# Patient Record
Sex: Male | Born: 1940 | Race: White | Hispanic: No | Smoking: Former smoker
Health system: Southern US, Community
[De-identification: ages and names within clinical notes are randomized; demographics above are authoritative.]

## PROBLEM LIST (undated history)

## (undated) DIAGNOSIS — M1990 Unspecified osteoarthritis, unspecified site: Secondary | ICD-10-CM

## (undated) DIAGNOSIS — E119 Type 2 diabetes mellitus without complications: Secondary | ICD-10-CM

## (undated) DIAGNOSIS — G4733 Obstructive sleep apnea (adult) (pediatric): Secondary | ICD-10-CM

## (undated) DIAGNOSIS — I2510 Atherosclerotic heart disease of native coronary artery without angina pectoris: Secondary | ICD-10-CM

## (undated) DIAGNOSIS — I10 Essential (primary) hypertension: Secondary | ICD-10-CM

## (undated) DIAGNOSIS — E785 Hyperlipidemia, unspecified: Secondary | ICD-10-CM

## (undated) DIAGNOSIS — Z7901 Long term (current) use of anticoagulants: Secondary | ICD-10-CM

## (undated) DIAGNOSIS — N183 Chronic kidney disease, stage 3 unspecified: Secondary | ICD-10-CM

## (undated) DIAGNOSIS — I509 Heart failure, unspecified: Secondary | ICD-10-CM

## (undated) DIAGNOSIS — I4891 Unspecified atrial fibrillation: Secondary | ICD-10-CM

## (undated) HISTORY — DX: Atherosclerotic heart disease of native coronary artery without angina pectoris: I25.10

## (undated) HISTORY — PX: BLADDER SURGERY: SHX569

## (undated) HISTORY — DX: Heart failure, unspecified: I50.9

## (undated) HISTORY — PX: REPLACEMENT TOTAL KNEE BILATERAL: SUR1225

## (undated) HISTORY — PX: CARDIOVERSION: SHX1299

## (undated) HISTORY — PX: STENT PLACEMENT VASCULAR (ARMC HX): HXRAD1737

## (undated) HISTORY — PX: BYPASS GRAFT: SHX909

## (undated) HISTORY — PX: COLON SURGERY: SHX602

---

## 1968-05-23 HISTORY — PX: BACK SURGERY: SHX140

## 1989-05-23 DIAGNOSIS — I2510 Atherosclerotic heart disease of native coronary artery without angina pectoris: Secondary | ICD-10-CM

## 1989-05-23 DIAGNOSIS — Z951 Presence of aortocoronary bypass graft: Secondary | ICD-10-CM

## 1989-05-23 DIAGNOSIS — I219 Acute myocardial infarction, unspecified: Secondary | ICD-10-CM

## 1989-05-23 HISTORY — DX: Presence of aortocoronary bypass graft: Z95.1

## 1989-05-23 HISTORY — DX: Atherosclerotic heart disease of native coronary artery without angina pectoris: I25.10

## 1989-05-23 HISTORY — DX: Acute myocardial infarction, unspecified: I21.9

## 1990-01-02 HISTORY — PX: CORONARY ARTERY BYPASS GRAFT: SHX141

## 1998-05-23 HISTORY — PX: BLADDER SURGERY: SHX569

## 2006-05-23 HISTORY — PX: STENT PLACEMENT VASCULAR (ARMC HX): HXRAD1737

## 2006-05-23 HISTORY — PX: REPLACEMENT TOTAL KNEE BILATERAL: SUR1225

## 2015-05-03 DIAGNOSIS — G4733 Obstructive sleep apnea (adult) (pediatric): Secondary | ICD-10-CM | POA: Insufficient documentation

## 2015-05-03 DIAGNOSIS — S04019A Injury of optic nerve, unspecified eye, initial encounter: Secondary | ICD-10-CM | POA: Insufficient documentation

## 2015-05-19 DIAGNOSIS — R0609 Other forms of dyspnea: Secondary | ICD-10-CM | POA: Insufficient documentation

## 2015-05-19 DIAGNOSIS — E785 Hyperlipidemia, unspecified: Secondary | ICD-10-CM | POA: Insufficient documentation

## 2015-06-17 DIAGNOSIS — R600 Localized edema: Secondary | ICD-10-CM | POA: Insufficient documentation

## 2015-07-10 HISTORY — PX: CARDIOVERSION: SHX1299

## 2015-08-05 DIAGNOSIS — R600 Localized edema: Secondary | ICD-10-CM | POA: Insufficient documentation

## 2015-08-13 DIAGNOSIS — J301 Allergic rhinitis due to pollen: Secondary | ICD-10-CM | POA: Insufficient documentation

## 2015-08-13 DIAGNOSIS — H6123 Impacted cerumen, bilateral: Secondary | ICD-10-CM | POA: Insufficient documentation

## 2016-04-22 DIAGNOSIS — R609 Edema, unspecified: Secondary | ICD-10-CM | POA: Insufficient documentation

## 2016-09-08 DIAGNOSIS — K439 Ventral hernia without obstruction or gangrene: Secondary | ICD-10-CM | POA: Insufficient documentation

## 2017-11-17 DIAGNOSIS — I872 Venous insufficiency (chronic) (peripheral): Secondary | ICD-10-CM | POA: Insufficient documentation

## 2018-11-02 DIAGNOSIS — E538 Deficiency of other specified B group vitamins: Secondary | ICD-10-CM | POA: Insufficient documentation

## 2018-11-02 DIAGNOSIS — K5903 Drug induced constipation: Secondary | ICD-10-CM | POA: Insufficient documentation

## 2019-06-26 ENCOUNTER — Encounter: Payer: Self-pay | Admitting: Internal Medicine

## 2019-06-26 ENCOUNTER — Emergency Department: Payer: Medicare Other

## 2019-06-26 ENCOUNTER — Other Ambulatory Visit: Payer: Self-pay

## 2019-06-26 ENCOUNTER — Observation Stay
Admission: EM | Admit: 2019-06-26 | Discharge: 2019-06-27 | Disposition: A | Discharge status: Home / Self Care | Payer: Medicare Other | Attending: Internal Medicine | Admitting: Internal Medicine

## 2019-06-26 DIAGNOSIS — Z79899 Other long term (current) drug therapy: Secondary | ICD-10-CM | POA: Insufficient documentation

## 2019-06-26 DIAGNOSIS — Z794 Long term (current) use of insulin: Secondary | ICD-10-CM

## 2019-06-26 DIAGNOSIS — Z7982 Long term (current) use of aspirin: Secondary | ICD-10-CM | POA: Insufficient documentation

## 2019-06-26 DIAGNOSIS — I10 Essential (primary) hypertension: Secondary | ICD-10-CM

## 2019-06-26 DIAGNOSIS — Z7984 Long term (current) use of oral hypoglycemic drugs: Secondary | ICD-10-CM | POA: Insufficient documentation

## 2019-06-26 DIAGNOSIS — Z20822 Contact with and (suspected) exposure to covid-19: Secondary | ICD-10-CM | POA: Diagnosis not present

## 2019-06-26 DIAGNOSIS — E785 Hyperlipidemia, unspecified: Secondary | ICD-10-CM | POA: Diagnosis not present

## 2019-06-26 DIAGNOSIS — E119 Type 2 diabetes mellitus without complications: Secondary | ICD-10-CM

## 2019-06-26 DIAGNOSIS — R778 Other specified abnormalities of plasma proteins: Secondary | ICD-10-CM

## 2019-06-26 DIAGNOSIS — E1140 Type 2 diabetes mellitus with diabetic neuropathy, unspecified: Secondary | ICD-10-CM

## 2019-06-26 DIAGNOSIS — I4891 Unspecified atrial fibrillation: Secondary | ICD-10-CM

## 2019-06-26 DIAGNOSIS — I4892 Unspecified atrial flutter: Secondary | ICD-10-CM | POA: Insufficient documentation

## 2019-06-26 DIAGNOSIS — Z87891 Personal history of nicotine dependence: Secondary | ICD-10-CM | POA: Insufficient documentation

## 2019-06-26 DIAGNOSIS — R079 Chest pain, unspecified: Secondary | ICD-10-CM | POA: Insufficient documentation

## 2019-06-26 DIAGNOSIS — Z951 Presence of aortocoronary bypass graft: Secondary | ICD-10-CM

## 2019-06-26 DIAGNOSIS — R002 Palpitations: Secondary | ICD-10-CM | POA: Diagnosis present

## 2019-06-26 DIAGNOSIS — R61 Generalized hyperhidrosis: Secondary | ICD-10-CM

## 2019-06-26 DIAGNOSIS — I480 Paroxysmal atrial fibrillation: Secondary | ICD-10-CM | POA: Diagnosis not present

## 2019-06-26 DIAGNOSIS — I2510 Atherosclerotic heart disease of native coronary artery without angina pectoris: Secondary | ICD-10-CM

## 2019-06-26 DIAGNOSIS — I472 Ventricular tachycardia: Principal | ICD-10-CM | POA: Insufficient documentation

## 2019-06-26 DIAGNOSIS — R7989 Other specified abnormal findings of blood chemistry: Secondary | ICD-10-CM | POA: Diagnosis not present

## 2019-06-26 DIAGNOSIS — I208 Other forms of angina pectoris: Secondary | ICD-10-CM

## 2019-06-26 DIAGNOSIS — I2089 Other forms of angina pectoris: Secondary | ICD-10-CM

## 2019-06-26 HISTORY — DX: Unspecified atrial fibrillation: I48.91

## 2019-06-26 HISTORY — DX: Chest pain, unspecified: R07.9

## 2019-06-26 HISTORY — DX: Essential (primary) hypertension: I10

## 2019-06-26 HISTORY — DX: Hyperlipidemia, unspecified: E78.5

## 2019-06-26 HISTORY — DX: Type 2 diabetes mellitus without complications: E11.9

## 2019-06-26 LAB — SARS CORONAVIRUS 2 (TAT 6-24 HRS): SARS Coronavirus 2: NEGATIVE

## 2019-06-26 LAB — TROPONIN I (HIGH SENSITIVITY)
Troponin I (High Sensitivity): 43 ng/L — ABNORMAL HIGH (ref <18)
Troponin I (High Sensitivity): 57 ng/L — ABNORMAL HIGH (ref <18)
Troponin I (High Sensitivity): 54 ng/L — ABNORMAL HIGH (ref <18)

## 2019-06-26 LAB — BASIC METABOLIC PANEL
Anion gap: 8 (ref 5–15)
BUN: 25 mg/dL — ABNORMAL HIGH (ref 8–23)
CO2: 27 mmol/L (ref 22–32)
Calcium: 9.3 mg/dL (ref 8.9–10.3)
Chloride: 106 mmol/L (ref 98–111)
Creatinine, Ser: 1.12 mg/dL (ref 0.61–1.24)
GFR calc Af Amer: >60 mL/min (ref >60)
GFR calc non Af Amer: >60 mL/min (ref >60)
Glucose, Bld: 150 mg/dL — ABNORMAL HIGH (ref 70–99)
Potassium: 4.6 mmol/L (ref 3.5–5.1)
Sodium: 141 mmol/L (ref 135–145)

## 2019-06-26 LAB — CBC
HCT: 42.6 % (ref 39.0–52.0)
Hemoglobin: 13.5 g/dL (ref 13.0–17.0)
MCH: 28.3 pg (ref 26.0–34.0)
MCHC: 31.7 g/dL (ref 30.0–36.0)
MCV: 89.3 fL (ref 80.0–100.0)
Platelets: 173 10*3/uL (ref 150–400)
RBC: 4.77 MIL/uL (ref 4.22–5.81)
RDW: 13.6 % (ref 11.5–15.5)
WBC: 6.1 10*3/uL (ref 4.0–10.5)
nRBC: 0 % (ref 0.0–0.2)

## 2019-06-26 LAB — GLUCOSE, CAPILLARY
Glucose-Capillary: 79 mg/dL (ref 70–99)
Glucose-Capillary: 153 mg/dL — ABNORMAL HIGH (ref 70–99)

## 2019-06-26 LAB — HEMOGLOBIN A1C
Hgb A1c MFr Bld: 6.2 % — ABNORMAL HIGH (ref 4.8–5.6)
Mean Plasma Glucose: 131.24 mg/dL

## 2019-06-26 MED ORDER — LIDOCAINE VISCOUS HCL 2 % MT SOLN
15.0000 mL | Freq: Once | OROMUCOSAL | Status: DC
  Filled 2019-06-26: qty 15

## 2019-06-26 MED ORDER — ALUM & MAG HYDROXIDE-SIMETH 200-200-20 MG/5ML PO SUSP: 30.0000 mL | Freq: Once | ORAL | Status: DC

## 2019-06-26 MED ORDER — RAMIPRIL 10 MG PO CAPS
20.0000 mg | ORAL_CAPSULE | Freq: Every day | ORAL | Status: DC
  Administered 2019-06-26 – 2019-06-27 (×2): 20 mg via ORAL
  Filled 2019-06-26 (×2): qty 2

## 2019-06-26 MED ORDER — DILTIAZEM HCL ER COATED BEADS 120 MG PO CP24
120.0000 mg | ORAL_CAPSULE | Freq: Every day | ORAL | Status: DC
  Administered 2019-06-27: 08:00:00 120 mg via ORAL
  Filled 2019-06-26: qty 1

## 2019-06-26 MED ORDER — ALBUTEROL SULFATE (2.5 MG/3ML) 0.083% IN NEBU: 2.5000 mg | INHALATION_SOLUTION | Freq: Four times a day (QID) | RESPIRATORY_TRACT | Status: DC | PRN

## 2019-06-26 MED ORDER — ACETAMINOPHEN 500 MG PO TABS
1000.0000 mg | ORAL_TABLET | Freq: Four times a day (QID) | ORAL | Status: DC | PRN
  Administered 2019-06-26 – 2019-06-27 (×2): 1000 mg via ORAL
  Filled 2019-06-26 (×2): qty 2

## 2019-06-26 MED ORDER — ATENOLOL 25 MG PO TABS
25.0000 mg | ORAL_TABLET | Freq: Every day | ORAL | Status: DC
  Administered 2019-06-26: 13:00:00 25 mg via ORAL
  Filled 2019-06-26: qty 1

## 2019-06-26 MED ORDER — SPIRONOLACTONE 25 MG PO TABS
25.0000 mg | ORAL_TABLET | Freq: Every day | ORAL | Status: DC
  Administered 2019-06-26 – 2019-06-27 (×2): 25 mg via ORAL
  Filled 2019-06-26 (×2): qty 1

## 2019-06-26 MED ORDER — ENOXAPARIN SODIUM 40 MG/0.4ML ~~LOC~~ SOLN: 40.0000 mg | SUBCUTANEOUS | Status: DC

## 2019-06-26 MED ORDER — TRAMADOL HCL 50 MG PO TABS: 50.0000 mg | ORAL_TABLET | Freq: Four times a day (QID) | ORAL | Status: DC | PRN

## 2019-06-26 MED ORDER — ASPIRIN EC 325 MG PO TBEC
325.0000 mg | DELAYED_RELEASE_TABLET | Freq: Every day | ORAL | Status: DC
  Administered 2019-06-26 – 2019-06-27 (×2): 325 mg via ORAL
  Filled 2019-06-26 (×2): qty 1

## 2019-06-26 MED ORDER — ATORVASTATIN CALCIUM 20 MG PO TABS
40.0000 mg | ORAL_TABLET | Freq: Every day | ORAL | Status: DC
  Administered 2019-06-26 – 2019-06-27 (×2): 40 mg via ORAL
  Filled 2019-06-26 (×2): qty 2

## 2019-06-26 MED ORDER — INSULIN ASPART 100 UNIT/ML ~~LOC~~ SOLN
0.0000 [IU] | Freq: Three times a day (TID) | SUBCUTANEOUS | Status: DC
  Filled 2019-06-26: qty 1

## 2019-06-26 MED ORDER — FLUTICASONE PROPIONATE 50 MCG/ACT NA SUSP
2.0000 | Freq: Every day | NASAL | Status: DC | PRN
  Filled 2019-06-26: qty 16

## 2019-06-26 MED ORDER — ONDANSETRON HCL 4 MG/2ML IJ SOLN: 4.0000 mg | Freq: Four times a day (QID) | INTRAMUSCULAR | Status: DC | PRN

## 2019-06-26 NOTE — ED Notes (Signed)
Pt given meal tray and water 

## 2019-06-26 NOTE — ED Notes (Signed)
Patient reports that he woke up this AM at approx 530 and checked his heart rate at home and it was 115. Patient reports he felt diaphoretic at that time. Patient states he has history of a fib and has been cardioverted in the past. Patient states he takes a 325 aspirin daily. Patient denies any chest pain and reports intermittent left arm pain that is ongoing and not new

## 2019-06-26 NOTE — ED Provider Notes (Signed)
[facility] Emergency Department Provider Note   ____________________________________________    I have reviewed the triage vital signs and the nursing notes.   HISTORY  Chief Complaint Palpitations     HPI Randall Vincent is a 79 y.o. male who presents with complaints of elevated heart rate.  Patient reports that he was checking his heart rate blood pressure and glucose like he does every morning and noted that his heart rate was elevated.  He reports usually his heart rate is in the 70s.  He did recently stop a beta-blocker about a month ago.  Follows with [facility] cardiology.  He is on 324 mg aspirin, no other blood thinners.  Review of records demonstrates a history of paroxysmal atrial fibrillation.  He reports that he was once cardioverted but has not had any issues for quite some time  No past medical history on file.  There are no problems to display for this patient.     Prior to Admission medications   Not on File     Allergies Patient has no allergy information on record.  No family history on file.  Social History No alcohol or drug use  Review of Systems  Constitutional: No fever/chills, did have an episode of "clamminess " Eyes: No visual changes.  ENT: No sore throat. Cardiovascular: Denies chest pain. Respiratory: Denies shortness of breath. Gastrointestinal: No abdominal pain.  No nausea, no vomiting.   Genitourinary: Negative for dysuria. Musculoskeletal: Negative for back pain. Skin: Negative for rash. Neurological: Negative for headaches   ____________________________________________   PHYSICAL EXAM:  VITAL SIGNS: ED Triage Vitals  Enc Vitals Group     BP 06/26/19 0640 (!) 201/101     Pulse Rate 06/26/19 0640 (!) 110     Resp 06/26/19 0640 20     Temp 06/26/19 0642 (!) 97.4 F (36.3 C)     Temp Source 06/26/19 0642 Oral     SpO2 06/26/19 0640 96 %     Weight 06/26/19 0641 117.9 kg (260 lb)   Height 06/26/19 0641 1.727 m (5\' 8" )     Head Circumference --      Peak Flow --      Pain Score 06/26/19 0641 0     Pain Loc --      Pain Edu? --      Excl. in GC? --     Constitutional: Alert and oriented.   Nose: No congestion/rhinnorhea. Mouth/Throat: Mucous membranes are moist.    Cardiovascular: Tachycardia, regular rhythm grossly normal heart sounds.  Good peripheral circulation. Respiratory: Normal respiratory effort.  No retractions. Lungs CTAB. Gastrointestinal: Soft and nontender. No distention.  No CVA tenderness.  Musculoskeletal: Mild edema bilaterally warm and well perfused Neurologic:  Normal speech and language. No gross focal neurologic deficits are appreciated.  Skin:  Skin is warm, dry and intact. No rash noted. Psychiatric: Mood and affect are normal. Speech and behavior are normal.  ____________________________________________   LABS (all labs ordered are listed, but only abnormal results are displayed)  Labs Reviewed  BASIC METABOLIC PANEL - Abnormal; Notable for the following components:      Result Value   Glucose, Bld 150 (*)    BUN 25 (*)    All other components within normal limits  TROPONIN I (HIGH SENSITIVITY) - Abnormal; Notable for the following components:   Troponin I (High Sensitivity) 43 (*)    All other components within normal limits  CBC   ____________________________________________  EKG  ED ECG REPORT I, Lavonia Drafts, the attending physician, personally viewed and interpreted this ECG.  Date: 06/26/2019  Rhythm: Atrial flutter QRS Axis: normal Intervals: Abnormal ST/T Wave abnormalities: normal Narrative Interpretation: no evidence of acute ischemia  ____________________________________________  RADIOLOGY  Chest x-ray ____________________________________________   PROCEDURES  Procedure(s) performed: No  Procedures   Critical Care performed: No ____________________________________________   INITIAL  IMPRESSION / ASSESSMENT AND PLAN / ED COURSE  Pertinent labs & imaging results that were available during my care of the patient were reviewed by me and considered in my medical decision making (see chart for details).  Patient presents with tachycardia, episode of clamminess.  Does have a history of coronary artery disease, follows with [facility] cardiology.  He is hypertensive here with mildly elevated heart rate, EKG most consistent with atrial flutter.  He is not on a beta-blocker at this time.  No chest pain or new shortness of breath.  Pending labs   Lab work significant for elevated troponin of 43.  Given hypertension, clamminess, paroxysmal a flutter in the setting of significant CAD will discuss with the hospitalist for admission    ____________________________________________   FINAL CLINICAL IMPRESSION(S) / ED DIAGNOSES  Final diagnoses:  Atrial flutter, paroxysmal (HCC)  Elevated troponin        Note:  This document was prepared using Dragon voice recognition software and may include unintentional dictation errors.   Lavonia Drafts, MD 06/26/19 [phone]

## 2019-06-26 NOTE — ED Triage Notes (Signed)
Pt in with co palpitations and htn this am, states did become diaphoretic. States same symptoms as when he had MI. States hx of cardioversion in the past due to afib.

## 2019-06-26 NOTE — H&P (Addendum)
History and Physical:    Randall Vincent   VHQ:[id number] DOB: 09-27-1940 DOA: 06/26/2019  Referring MD/provider: Dr. Cyril Loosen PCP: Elspeth Cho., MD   Patient coming from: Home  Chief Complaint: Tachycardia  History of Present Illness:   Randall Vincent is an 79 y.o. male with past medical history significant for CAD status post CABG and status post stents times 06/2007, history of paroxysmal A. fib status post cardioversion who noted that his heart rate was 110 and blood pressure was grossly elevated this morning at his usual morning blood pressure check.  Patient states that he was tapered off of his atenolol by his PCP per his request because of erectile dysfunction.  He was placed on amlodipine instead of atenolol and has been on amlodipine for 1 month now.  This morning he noted his heart rate to be 110 when it is usually 70.  He also felt somewhat cool and clammy which was similar to his previous anginal equivalent.  ED Course:  The patient was noted to be comfortable with no chest pain or shortness of breath.  EKG was was noted to be rate controlled A. fib/flutter.  Patient is admitted for rule out given episode of clamminess this morning which is similar to his anginal equivalent.  ROS:   ROS   Review of Systems: General: Denies fever, chills, malaise,  Respiratory: Denies cough, SOB at rest or hemoptysis GI: Denies nausea, vomiting, diarrhea or constipation GU: Denies dysuria, frequency or hematuria CNS: Denies HA, dizziness, confusion, new weakness or clumsiness. Musculoskeletal: Denies new back or joint pain Blood/lymphatics: Denies easy bruising or bleeding Mood/affect: Denies anxiety/depression    Past Medical History:   Past Medical History:  Diagnosis Date  . A-fib (HCC)   . Diabetes (HCC)   . Hyperlipidemia   . Hypertension     Past Surgical History:     Social History:   Social History   Socioeconomic History  . Marital status: Widowed    Spouse  name: Not on file  . Number of children: Not on file  . Years of education: Not on file  . Highest education level: Not on file  Occupational History  . Not on file  Tobacco Use  . Smoking status: Former Smoker    Quit date: 1991    Years since quitting: 30.1  . Smokeless tobacco: Never Used  Substance and Sexual Activity  . Alcohol use: Not on file  . Drug use: Not on file  . Sexual activity: Not on file  Other Topics Concern  . Not on file  Social History Narrative  . Not on file   Social Determinants of Health   Financial Resource Strain:   . Difficulty of Paying Living Expenses: Not on file  Food Insecurity:   . Worried About Programme researcher, broadcasting/film/video in the Last Year: Not on file  . Ran Out of Food in the Last Year: Not on file  Transportation Needs:   . Lack of Transportation (Medical): Not on file  . Lack of Transportation (Non-Medical): Not on file  Physical Activity:   . Days of Exercise per Week: Not on file  . Minutes of Exercise per Session: Not on file  Stress:   . Feeling of Stress : Not on file  Social Connections:   . Frequency of Communication with Friends and Family: Not on file  . Frequency of Social Gatherings with Friends and Family: Not on file  . Attends Religious Services: Not on file  .  Active Member of Clubs or Organizations: Not on file  . Attends Banker Meetings: Not on file  . Marital Status: Not on file  Intimate Partner Violence:   . Fear of Current or Ex-Partner: Not on file  . Emotionally Abused: Not on file  . Physically Abused: Not on file  . Sexually Abused: Not on file    Allergies   Patient has no known allergies.  Family history:   No family history on file.  Current Medications:   Prior to Admission medications   Medication Sig Start Date End Date Taking? Authorizing Provider  acetaminophen (TYLENOL) 500 MG tablet Take 500-1,000 mg by mouth every 6 (six) hours as needed for mild pain or fever.   Yes [provider]  albuterol (VENTOLIN HFA) 108 (90 Base) MCG/ACT inhaler Inhale 2 puffs into the lungs every 6 (six) hours as needed for wheezing.   Yes [provider]  amLODipine (NORVASC) 5 MG tablet Take 5 mg by mouth daily. 05/20/19  Yes [provider]  aspirin 325 MG tablet Take 325 mg by mouth daily.   Yes [provider]  atorvastatin (LIPITOR) 40 MG tablet Take 40 mg by mouth daily. 06/18/19  Yes [provider]  fluticasone (FLONASE) 50 MCG/ACT nasal spray Place 2 sprays into both nostrils daily as needed for allergies or rhinitis.   Yes [provider]  metFORMIN (GLUCOPHAGE-XR) 500 MG 24 hr tablet Take 1,000 mg by mouth daily. 05/20/19  Yes [provider]  ramipril (ALTACE) 10 MG capsule Take 20 mg by mouth daily. 06/18/19  Yes [provider]  Semaglutide, 1 MG/DOSE, 2 MG/1.5ML SOPN Inject 1 mg into the skin every Thursday.   Yes [provider]  sildenafil (VIAGRA) 100 MG tablet Take 100 mg by mouth daily as needed for erectile dysfunction. 06/24/19  Yes [provider]  spironolactone (ALDACTONE) 25 MG tablet Take 25 mg by mouth daily. 06/18/19  Yes [provider]  traMADol (ULTRAM) 50 MG tablet Take 50 mg by mouth every 6 (six) hours as needed for moderate pain.   Yes [provider]  vitamin B-12 (CYANOCOBALAMIN) 1000 MCG tablet Take 1,000 mcg by mouth daily.   Yes [provider]    Physical Exam:   Vitals:   06/26/19 1330 06/26/19 1345 06/26/19 1400 06/26/19 1503  BP: (!) 152/73  (!) 152/77 (!) 175/85  Pulse: 72 68 70 71  Resp: (!) 21 (!) 22 19 18   Temp:    98.1 F (36.7 C)  TempSrc:      SpO2: 96% 95% 97% 100%  Weight:      Height:         Physical Exam: Blood pressure (!) 175/85, pulse 71, temperature 98.1 F (36.7 C), resp. rate 18, height 5\' 8"  (1.727 m), weight 117.9 kg, SpO2 100 %. Gen: Relatively well-appearing man sitting up in stretcher in no acute  distress Constitutional: Alert and awake, oriented x3, not in any acute distress. Eyes: sclera anicteric, conjuctiva clear, no lid lag, no exophthalmos, EOMI CVS: S1-S2 clear, 2/6 systolic murmur, no LE edema, normal pedal pulses  Respiratory:  normal effort, symmetrical excursion, CTA without adventitious sounds.  GI: NABS, soft, NT, ND, no palpable masses.  LE: No edema. No cyanosis Neuro: A/O x 3, Moving all extremities equally with normal strength, CN 3-12 intact, grossly nonfocal.  Psych: patient is logical and coherent, judgement and insight appear normal, mood and affect appropriate to situation. Skin: no rashes  or lesions or ulcers,    Data Review:    Labs: Basic Metabolic Panel: Recent Labs  Lab 06/26/19 0654  NA 141  K 4.6  CL 106  CO2 27  GLUCOSE 150*  BUN 25*  CREATININE 1.12  CALCIUM 9.3   Liver Function Tests: No results for input(s): AST, ALT, ALKPHOS, BILITOT, PROT, ALBUMIN in the last 168 hours. No results for input(s): LIPASE, AMYLASE in the last 168 hours. No results for input(s): AMMONIA in the last 168 hours. CBC: Recent Labs  Lab 06/26/19 0654  WBC 6.1  HGB 13.5  HCT 42.6  MCV 89.3  PLT 173   Cardiac Enzymes: No results for input(s): CKTOTAL, CKMB, CKMBINDEX, TROPONINI in the last 168 hours.  BNP (last 3 results) No results for input(s): PROBNP in the last 8760 hours. CBG: Recent Labs  Lab 06/26/19 1622  GLUCAP 79    Urinalysis No results found for: COLORURINE, APPEARANCEUR, LABSPEC, PHURINE, GLUCOSEU, HGBUR, BILIRUBINUR, KETONESUR, PROTEINUR, UROBILINOGEN, NITRITE, LEUKOCYTESUR    Radiographic Studies: DG Chest 2 View  Result Date: 06/26/2019 CLINICAL DATA:  Atrial fibrillation EXAM: CHEST - 2 VIEW COMPARISON:  04/27/2015 FINDINGS: Cardiomegaly. CABG. Mild interstitial prominence but no Kerley lines, effusion, or consolidation. Chronic extrapleural thickening the left base, smooth from fat deposition by 2018 abdominal CT.  Spondylosis with bridging osteophytes. IMPRESSION: Cardiomegaly without failure. Electronically Signed   By: Monte Fantasia M.D.   On: 06/26/2019 07:34    EKG: Independently reviewed.  I am not entirely sure what this rhythm is.  It is narrow complex and regular at 110.  There are at least 2-3 different morphologies of QRS.  There may be P waves that run through.  Q waves in 2 3 and F.  No acute ST-T wave changes.  Assessment/Plan:   Principal Problem:   Anginal equivalent (HCC) Active Problems:   CAD, multiple vessel   S/P CABG x 2   Paroxysmal A-fib (HCC)   Essential hypertension   DM (diabetes mellitus), type 68 ([place])  79 year old man with history of CAD status post CABG status post stenting times 06/2007, history of paroxysmal A. fib status post ablation now comes in with episode of perceived tachycardia 1 month after his atenolol was discontinued per his request because of erectile dysfunction.  Patient had an episode of feeling clammy during this time similar to his previous anginal equivalent.  EKG with unclear rhythm.   ANGINAL EQUIVALENT Patient had a brief episode of clamminess when his heart rate was 112 which apparently is similar to his anginal equivalent.  EKG without any ischemic changes.  Initial troponin is 43.  Patient received aspirin in the ED.  Without similar symptoms of clamminess at present.  ABNORMAL HEART RHYTHM It is unclear to me exactly what rhythm patient is in and whether he is reverted back to A. fib flutter. I have a text into Dr. Clayborn Bigness who is on-call however it states that he has been in procedure all day. Would repeat text him in the morning if he has not been able to see the patient later on today. If he is in A. fib patient will need to be back on anticoagulation. At present he is rate controlled.  HTN Will restart atenolol 25 mg, previous dose was atenolol HCTZ 50/25 Hold amlodipine I would titrate atenolol back to 50 as tolerated In any case  patient should be on a beta-blocker given his significant CAD  DM2 Carb controlled diet SSI AC at bedtime Hold semaglutide and  Metformin   Other information:   DVT prophylaxis: Lovenox ordered. Code Status: Full Family Communication: Patient states his family knows that he is here Disposition Plan: Home Consults called: Cardiology Admission status: Observation  Lelar Farewell Tublu Ovila [place] [facility]  If 7PM-7AM, please contact night-coverage [email or web address] Password [facility] 06/26/2019, 6:08 PM

## 2019-06-26 NOTE — ED Notes (Signed)
Pt unhooked to go to restroom. Able to walk independently.

## 2019-06-26 NOTE — Consult Note (Signed)
CARDIOLOGY CONSULT NOTE               Patient ID: Randall Vincent MRN: [id number] DOB/AGE: [birth year] 78 y.o.  Admit date: 06/26/2019 Referring Physician Dr Luberta Robertson hospitalist Primary Physician Patrina Levering MD Primary Cardiologist Dr. Theron Arista [facility] Reason for Consultation tachycardia  HPI: Patient is a 79 year old male multiple medical problems coronary disease status post coronary bypass surgery in the distant past but positive with PCI and stent last one in 2009 he had paroxysmal A. fib status post cardioversion since 2017 was on atenolol but that was discontinued because of erectile dysfunction issues he was switched to amlodipine and has done reasonably well with his blood pressure patient denied any recent palpitations or tachycardia but noticed them this morning his rates normally 60-70 but today was 110 so he came to emergency room he was found to have a wide-complex rhythm initially they thought it was atrial flutter I am not sure that at some point he did have nonsustained ventricular tachycardia there is no twelve-lead available for my inspection.  By time I saw the patient on the floor he was back in sinus rhythm at 70 he had received atenolol at some point during his hospitalization.  He has history of diabetes hyperlipidemia hypertension but feels reasonably well now.  Denies any chest pain anginal symptoms  Review of systems complete and found to be negative unless listed above     Past Medical History:  Diagnosis Date  . A-fib (HCC)   . Diabetes (HCC)   . Hyperlipidemia   . Hypertension     Past Surgical History:  Procedure Laterality Date  . BLADDER SURGERY    . BYPASS GRAFT    . CARDIOVERSION    . COLON SURGERY    . REPLACEMENT TOTAL KNEE BILATERAL    . STENT PLACEMENT VASCULAR ([facility] HX)      Medications Prior to Admission  Medication Sig Dispense Refill Last Dose  . acetaminophen (TYLENOL) 500 MG tablet Take 500-1,000 mg by mouth every 6 (six)  hours as needed for mild pain or fever.   Unknown at PRN  . albuterol (VENTOLIN HFA) 108 (90 Base) MCG/ACT inhaler Inhale 2 puffs into the lungs every 6 (six) hours as needed for wheezing.     [name] amLODipine (NORVASC) 5 MG tablet Take 5 mg by mouth daily.   06/25/2019 at 0800  . aspirin 325 MG tablet Take 325 mg by mouth daily.   06/25/2019 at 0800  . atorvastatin (LIPITOR) 40 MG tablet Take 40 mg by mouth daily.   06/25/2019 at Unknown  . fluticasone (FLONASE) 50 MCG/ACT nasal spray Place 2 sprays into both nostrils daily as needed for allergies or rhinitis.   Unknown at PRN  . metFORMIN (GLUCOPHAGE-XR) 500 MG 24 hr tablet Take 1,000 mg by mouth daily.   06/25/2019 at 0800  . ramipril (ALTACE) 10 MG capsule Take 20 mg by mouth daily.   06/25/2019 at 0800  . Semaglutide, 1 MG/DOSE, 2 MG/1.5ML SOPN Inject 1 mg into the skin every Thursday.   06/20/2019 at Unknown  . sildenafil (VIAGRA) 100 MG tablet Take 100 mg by mouth daily as needed for erectile dysfunction.   Unknown at PRN  . spironolactone (ALDACTONE) 25 MG tablet Take 25 mg by mouth daily.   06/25/2019 at Unknown  . traMADol (ULTRAM) 50 MG tablet Take 50 mg by mouth every 6 (six) hours as needed for moderate pain.   Unknown at PRN  . vitamin B-12 (  CYANOCOBALAMIN) 1000 MCG tablet Take 1,000 mcg by mouth daily.      Social History   Socioeconomic History  . Marital status: Widowed    Spouse name: Not on file  . Number of children: Not on file  . Years of education: Not on file  . Highest education level: Not on file  Occupational History  . Not on file  Tobacco Use  . Smoking status: Former Smoker    Quit date: 1991    Years since quitting: 30.1  . Smokeless tobacco: Never Used  Substance and Sexual Activity  . Alcohol use: Not on file  . Drug use: Not on file  . Sexual activity: Not on file  Other Topics Concern  . Not on file  Social History Narrative  . Not on file   Social Determinants of Health   Financial Resource Strain:   .  Difficulty of Paying Living Expenses: Not on file  Food Insecurity:   . Worried About Charity fundraiser in the Last Year: Not on file  . Ran Out of Food in the Last Year: Not on file  Transportation Needs:   . Lack of Transportation (Medical): Not on file  . Lack of Transportation (Non-Medical): Not on file  Physical Activity:   . Days of Exercise per Week: Not on file  . Minutes of Exercise per Session: Not on file  Stress:   . Feeling of Stress : Not on file  Social Connections:   . Frequency of Communication with Friends and Family: Not on file  . Frequency of Social Gatherings with Friends and Family: Not on file  . Attends Religious Services: Not on file  . Active Member of Clubs or Organizations: Not on file  . Attends Archivist Meetings: Not on file  . Marital Status: Not on file  Intimate Partner Violence:   . Fear of Current or Ex-Partner: Not on file  . Emotionally Abused: Not on file  . Physically Abused: Not on file  . Sexually Abused: Not on file    No family history on file.    Review of systems complete and found to be negative unless listed above      PHYSICAL EXAM  General: Well developed, well nourished, in no acute distress HEENT:  Normocephalic and atramatic Neck:  No JVD.  Lungs: Clear bilaterally to auscultation and percussion. Heart: HRRR . Normal S1 and S2 without gallops or murmurs.  Abdomen: Bowel sounds are positive, abdomen soft and non-tender  Msk:  Back normal, normal gait. Normal strength and tone for age. Extremities: No clubbing, cyanosis or edema.   Neuro: Alert and oriented X 3. Psych:  Good affect, responds appropriately  Labs:   Lab Results  Component Value Date   WBC 6.1 06/26/2019   HGB 13.5 06/26/2019   HCT 42.6 06/26/2019   MCV 89.3 06/26/2019   PLT 173 06/26/2019    Recent Labs  Lab 06/26/19 0654  NA 141  K 4.6  CL 106  CO2 27  BUN 25*  CREATININE 1.12  CALCIUM 9.3  GLUCOSE 150*   No results  found for: CKTOTAL, CKMB, CKMBINDEX, TROPONINI No results found for: CHOL No results found for: HDL No results found for: LDLCALC No results found for: TRIG No results found for: CHOLHDL No results found for: LDLDIRECT    Radiology: DG Chest 2 View  Result Date: 06/26/2019 CLINICAL DATA:  Atrial fibrillation EXAM: CHEST - 2 VIEW COMPARISON:  04/27/2015 FINDINGS: Cardiomegaly. CABG.  Mild interstitial prominence but no Kerley lines, effusion, or consolidation. Chronic extrapleural thickening the left base, smooth from fat deposition by 2018 abdominal CT. Spondylosis with bridging osteophytes. IMPRESSION: Cardiomegaly without failure. Electronically Signed   By: Marnee Spring M.D.   On: 06/26/2019 07:34   Telemetry wide-complex tachycardia rate of about 150 cannot exclude nonsustained VT EKG: Normal sinus rhythm nonspecific ST-T wave changes  ASSESSMENT AND PLAN:  Wide-complex tachycardia Nonsustained VT not excluded on rhythm strip only History of atrial fibrillation not on anticoagulation History of ablation Coronary bypass surgery Multiple PCI and stents Hypertension COPD Diabetes Hyperlipidemia Erectile dysfunction . Plan Agree with admit to telemetry Agree initially with starting atenolol but would switch to Cardizem Discontinue amlodipine Continue aspirin therapy since the patient refuses high-level anticoagulation for A. Fib Continue diabetes management control Maintain hypertension management Continue Lipitor therapy for lipid management Denies any clear evidence of angina so we will continue conservative medical therapy Have the patient follow-up with his cardiologist I do not see his overall left ventricular function with possible nonsustained VT on telemetry  Signed: Alwyn Pea MD 06/26/2019, 4:43 PM

## 2019-06-26 NOTE — Progress Notes (Signed)
Pt arrived on the unit from the ED at 1500. VSS. Pt is A&Ox4. No c/o pain. Pt oriented to room and bedside equipment. Orders reviewed, acknowledged, and initiated. Care plan and education initiated. Asked pt to use call bell if he needed to get up and go into the bathroom and pt verbalized understanding. Pt is very steady on his feet and scored at low fall risk. Bed in lowest position and the call bell is within reach.

## 2019-06-27 DIAGNOSIS — I208 Other forms of angina pectoris: Secondary | ICD-10-CM | POA: Diagnosis not present

## 2019-06-27 LAB — TROPONIN I (HIGH SENSITIVITY): Troponin I (High Sensitivity): 51 ng/L — ABNORMAL HIGH (ref <18)

## 2019-06-27 LAB — GLUCOSE, CAPILLARY
Glucose-Capillary: 135 mg/dL — ABNORMAL HIGH (ref 70–99)
Glucose-Capillary: 125 mg/dL — ABNORMAL HIGH (ref 70–99)

## 2019-06-27 MED ORDER — DILTIAZEM HCL ER COATED BEADS 120 MG PO CP24: 120.0000 mg | ORAL_CAPSULE | Freq: Every day | ORAL | 1 refills | Status: DC

## 2019-06-27 NOTE — Progress Notes (Signed)
Walked patient around unit.   HR went up to 93 and O2 stayed around 96-97%.  Patient did not have any SOB, dizziness or CP/palpitations

## 2019-06-27 NOTE — Plan of Care (Signed)

## 2019-07-02 NOTE — Discharge Summary (Signed)
Physician Discharge Summary  Randall Vincent OVF:[id number] DOB: 1940-12-27 DOA: 06/26/2019  PCP: Elspeth Cho., MD  Admit date: 06/26/2019 Discharge date: 06/27/2019  Time spent: 35 minutes  Recommendations for Outpatient Follow-up:  1. Cardiology Dr.Cheek in [facility], consider discussing anticoagulation again at Follow up   Discharge Diagnoses:   Transient Atrial FLutter  Transient V tach   CAD, multiple vessel   S/P CABG x 2   Paroxysmal A-fib (HCC)   Essential hypertension   DM (diabetes mellitus), type 2 (HCC)   Discharge Condition: stable  Diet recommendation: diabetic heart healthy  Filed Weights   06/26/19 0641  Weight: 117.9 kg    History of present illness:  Randall Vincent is an 79 y.o. male with past medical history significant for CAD status post CABG and status post stents times 06/2007, history of paroxysmal A. fib status post cardioversion who noted that his heart rate was 110 and blood pressure was grossly elevated this morning at his usual morning blood pressure check.  Patient states that he was tapered off of his atenolol by his PCP per his request because of erectile dysfunction.  He was placed on amlodipine instead of atenolol and has been on amlodipine for 1 month now.  This morning he noted his heart rate to be 110 when it is usually 70.  He also felt somewhat cool and clammy which was similar to his previous anginal equivalent.  ED Course:  The patient was noted to be comfortable with no chest pain or shortness of breath.  EKG was was noted to be rate controlled A. fib/flutter  Hospital Course:   Transient Atrial FIbrillation with RVR Transient Wide complex tachycardia -Patient had stopped his atenolol previously, started on p.o. Cardizem this admission -Converted to sinus rhythm and improved symptomatically -seen by Cardiology Dr.Callwood this admission, recommended Cardizem, pt refused anticoagulation and hence recommended to stay on aspirin at  discharge, follow-up with primary cardiologist in [facility] -Continue Cardizem at discharge-cardiology outses -Patient asymptomatic on the day of discharge with stable vital signs -Pt advised to follow-up  Rest of his medical problems were stable  Consultations:  Cardiology [facility]  Discharge Exam: Vitals:   06/27/19 0724 06/27/19 1122  BP: (!) 147/76 (!) 144/98  Pulse: (!) 57 69  Resp: 18 18  Temp: (!) 97.5 F (36.4 C) (!) 97.3 F (36.3 C)  SpO2: 97% 98%    General: AAOx3 Cardiovascular: S!S2/RRR Respiratory: CTAB  Discharge Instructions   Discharge Instructions    Diet - low sodium heart healthy   Complete by: As directed    Diet Carb Modified   Complete by: As directed    Increase activity slowly   Complete by: As directed      Allergies as of 06/27/2019   No Known Allergies     Medication List    STOP taking these medications   amLODipine 5 MG tablet Commonly known as: NORVASC     TAKE these medications   acetaminophen 500 MG tablet Commonly known as: TYLENOL Take 500-1,000 mg by mouth every 6 (six) hours as needed for mild pain or fever.   albuterol 108 (90 Base) MCG/ACT inhaler Commonly known as: VENTOLIN HFA Inhale 2 puffs into the lungs every 6 (six) hours as needed for wheezing.   aspirin 325 MG tablet Take 325 mg by mouth daily.   atorvastatin 40 MG tablet Commonly known as: LIPITOR Take 40 mg by mouth daily.   diltiazem 120 MG 24 hr capsule Commonly known as:  CARDIZEM CD Take 1 capsule (120 mg total) by mouth daily.   fluticasone 50 MCG/ACT nasal spray Commonly known as: FLONASE Place 2 sprays into both nostrils daily as needed for allergies or rhinitis.   metFORMIN 500 MG 24 hr tablet Commonly known as: GLUCOPHAGE-XR Take 1,000 mg by mouth daily.   ramipril 10 MG capsule Commonly known as: ALTACE Take 20 mg by mouth daily.   Semaglutide (1 MG/DOSE) 2 MG/1.5ML Sopn Inject 1 mg into the skin every Thursday.   sildenafil  100 MG tablet Commonly known as: VIAGRA Take 100 mg by mouth daily as needed for erectile dysfunction.   spironolactone 25 MG tablet Commonly known as: ALDACTONE Take 25 mg by mouth daily.   traMADol 50 MG tablet Commonly known as: ULTRAM Take 50 mg by mouth every 6 (six) hours as needed for moderate pain.   vitamin B-12 1000 MCG tablet Commonly known as: CYANOCOBALAMIN Take 1,000 mcg by mouth daily.      No Known Allergies Follow-up Information    Elspeth Cho., MD. Go on 07/04/2019.   Specialty: Internal Medicine Why: appointment at [facility] information: [address] [place] [address] [phone]        Merleen Milliner, MD. Go on 07/12/2019.   Specialty: Cardiology Why: appointment at [facility] information: [address] [place] [address] [phone]            The results of significant diagnostics from this hospitalization (including imaging, microbiology, ancillary and laboratory) are listed below for reference.    Significant Diagnostic Studies: DG Chest 2 View  Result Date: 06/26/2019 CLINICAL DATA:  Atrial fibrillation EXAM: CHEST - 2 VIEW COMPARISON:  04/27/2015 FINDINGS: Cardiomegaly. CABG. Mild interstitial prominence but no Kerley lines, effusion, or consolidation. Chronic extrapleural thickening the left base, smooth from fat deposition by 2018 abdominal CT. Spondylosis with bridging osteophytes. IMPRESSION: Cardiomegaly without failure. Electronically Signed   By: Marnee Spring M.D.   On: 06/26/2019 07:34    Microbiology: Recent Results (from the past 240 hour(s))  SARS CORONAVIRUS 2 (TAT 6-24 HRS) Nasopharyngeal Nasopharyngeal Swab     Status: None   Collection Time: 06/26/19  8:53 AM   Specimen: Nasopharyngeal Swab  Result Value Ref Range Status   SARS Coronavirus 2 NEGATIVE NEGATIVE Final    Comment: (NOTE) SARS-CoV-2 target nucleic acids are NOT DETECTED. The SARS-CoV-2 RNA is  generally detectable in upper and lower respiratory specimens during the acute phase of infection. Negative results do not preclude SARS-CoV-2 infection, do not rule out co-infections with other pathogens, and should not be used as the sole basis for treatment or other patient management decisions. Negative results must be combined with clinical observations, patient history, and epidemiological information. The expected result is Negative. Fact Sheet for Patients: HairSlick.no Fact Sheet for Healthcare Providers: quierodirigir.com This test is not yet approved or cleared by the [place] FDA and  has been authorized for detection and/or diagnosis of SARS-CoV-2 by FDA under an Emergency Use Authorization (EUA). This EUA will remain  in effect (meaning this test can be used) for the duration of the COVID-19 declaration under Section 56 4(b)(1) of the Act, 21 U.S.C. section 360bbb-3(b)(1), unless the authorization is terminated or revoked sooner. Performed at [facility], [address], [place], [place] [address]      Labs: Basic Metabolic Panel: Recent Labs  Lab 06/26/19 0654  NA 141  K 4.6  CL 106  CO2  27  GLUCOSE 150*  BUN 25*  CREATININE 1.12  CALCIUM 9.3   Liver Function Tests: No results for input(s): AST, ALT, ALKPHOS, BILITOT, PROT, ALBUMIN in the last 168 hours. No results for input(s): LIPASE, AMYLASE in the last 168 hours. No results for input(s): AMMONIA in the last 168 hours. CBC: Recent Labs  Lab 06/26/19 0654  WBC 6.1  HGB 13.5  HCT 42.6  MCV 89.3  PLT 173   Cardiac Enzymes: No results for input(s): CKTOTAL, CKMB, CKMBINDEX, TROPONINI in the last 168 hours. BNP: BNP (last 3 results) No results for input(s): BNP in the last 8760 hours.  ProBNP (last 3 results) No results for input(s): PROBNP in the last 8760 hours.  CBG: Recent Labs  Lab 06/26/19 1622 06/26/19 2104  06/27/19 0723 06/27/19 1127  GLUCAP 79 153* 135* 125*       Signed:  Domenic Polite MD.  [facility] 07/02/2019, 2:21 PM

## 2019-07-04 DIAGNOSIS — E1169 Type 2 diabetes mellitus with other specified complication: Secondary | ICD-10-CM | POA: Insufficient documentation

## 2019-08-08 HISTORY — PX: CARDIOVERSION: SHX1299

## 2019-10-22 HISTORY — PX: CARDIOVERSION: SHX1299

## 2019-11-30 ENCOUNTER — Emergency Department
Admission: EM | Admit: 2019-11-30 | Discharge: 2019-11-30 | Disposition: A | Discharge status: Home / Self Care | Payer: Medicare Other | Attending: Emergency Medicine | Admitting: Emergency Medicine

## 2019-11-30 ENCOUNTER — Other Ambulatory Visit: Payer: Self-pay

## 2019-11-30 DIAGNOSIS — Y999 Unspecified external cause status: Secondary | ICD-10-CM | POA: Insufficient documentation

## 2019-11-30 DIAGNOSIS — Z7901 Long term (current) use of anticoagulants: Secondary | ICD-10-CM | POA: Diagnosis not present

## 2019-11-30 DIAGNOSIS — Y929 Unspecified place or not applicable: Secondary | ICD-10-CM | POA: Insufficient documentation

## 2019-11-30 DIAGNOSIS — Z7984 Long term (current) use of oral hypoglycemic drugs: Secondary | ICD-10-CM | POA: Diagnosis not present

## 2019-11-30 DIAGNOSIS — S70311A Abrasion, right thigh, initial encounter: Secondary | ICD-10-CM | POA: Diagnosis not present

## 2019-11-30 DIAGNOSIS — E119 Type 2 diabetes mellitus without complications: Secondary | ICD-10-CM | POA: Diagnosis not present

## 2019-11-30 DIAGNOSIS — Z87891 Personal history of nicotine dependence: Secondary | ICD-10-CM | POA: Diagnosis not present

## 2019-11-30 DIAGNOSIS — Y849 Medical procedure, unspecified as the cause of abnormal reaction of the patient, or of later complication, without mention of misadventure at the time of the procedure: Secondary | ICD-10-CM | POA: Diagnosis not present

## 2019-11-30 DIAGNOSIS — I10 Essential (primary) hypertension: Secondary | ICD-10-CM | POA: Insufficient documentation

## 2019-11-30 DIAGNOSIS — I2510 Atherosclerotic heart disease of native coronary artery without angina pectoris: Secondary | ICD-10-CM | POA: Insufficient documentation

## 2019-11-30 DIAGNOSIS — Z7982 Long term (current) use of aspirin: Secondary | ICD-10-CM | POA: Insufficient documentation

## 2019-11-30 DIAGNOSIS — Z951 Presence of aortocoronary bypass graft: Secondary | ICD-10-CM | POA: Diagnosis not present

## 2019-11-30 DIAGNOSIS — E785 Hyperlipidemia, unspecified: Secondary | ICD-10-CM | POA: Insufficient documentation

## 2019-11-30 DIAGNOSIS — S31819A Unspecified open wound of right buttock, initial encounter: Secondary | ICD-10-CM | POA: Diagnosis present

## 2019-11-30 DIAGNOSIS — Y939 Activity, unspecified: Secondary | ICD-10-CM | POA: Diagnosis not present

## 2019-11-30 NOTE — Discharge Instructions (Addendum)
Follow-up with your primary care provider if any continued problems.  Leave the dressing that was applied today on for 2 days.  Area is not bleeding currently.  Bleeding probably can be controlled with pressure and the hemostatic dressing that was applied.  You may remove the dressing in 2-3 days.  Return to the emergency department if any continued difficulty with this area.

## 2019-11-30 NOTE — ED Provider Notes (Signed)
[facility] Emergency Department Provider Note  ____________________________________________   First MD Initiated Contact with Patient 11/30/19 1029     (approximate)  I have reviewed the triage vital signs and the nursing notes.   HISTORY  Chief Complaint Wound Check   HPI Randall Vincent is a 79 y.o. male presents to the ED with complaint of some bleeding to his right buttocks.  Patient states that yesterday he scratched because he was itching.  Patient currently is on Pradaxa for A. fib.  He states that he began bleeding and has had problems controlling the bleeding.  He denies any pain.     Past Medical History:  Diagnosis Date   A-fib (HCC)    Diabetes (HCC)    Hyperlipidemia    Hypertension     Patient Active Problem List   Diagnosis Date Noted   Chest pain 06/26/2019   CAD, multiple vessel 06/26/2019   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/26/2019   Paroxysmal A-fib (HCC) 06/26/2019   Essential hypertension 06/26/2019   DM (diabetes mellitus), type 2 (HCC) 06/26/2019   Anginal equivalent (HCC) 06/26/2019    Past Surgical History:  Procedure Laterality Date   BLADDER SURGERY     BYPASS GRAFT     CARDIOVERSION     COLON SURGERY     REPLACEMENT TOTAL KNEE BILATERAL     STENT PLACEMENT VASCULAR ([facility] HX)      Prior to Admission medications   Medication Sig Start Date End Date Taking? Authorizing Provider  dabigatran (PRADAXA) 150 MG CAPS capsule Take 150 mg by mouth 2 (two) times daily.   Yes [provider]  acetaminophen (TYLENOL) 500 MG tablet Take 500-1,000 mg by mouth every 6 (six) hours as needed for mild pain or fever.    [provider]  albuterol (VENTOLIN HFA) 108 (90 Base) MCG/ACT inhaler Inhale 2 puffs into the lungs every 6 (six) hours as needed for wheezing.    [provider]  aspirin 325 MG tablet Take 325 mg by mouth daily.    [provider]  atorvastatin (LIPITOR) 40 MG tablet Take 40  mg by mouth daily. 06/18/19   [provider]  diltiazem (CARDIZEM CD) 120 MG 24 hr capsule Take 1 capsule (120 mg total) by mouth daily. 06/28/19   Zannie Cove, MD  fluticasone [facility]) 50 MCG/ACT nasal spray Place 2 sprays into both nostrils daily as needed for allergies or rhinitis.    [provider]  metFORMIN (GLUCOPHAGE-XR) 500 MG 24 hr tablet Take 1,000 mg by mouth daily. 05/20/19   [provider]  ramipril (ALTACE) 10 MG capsule Take 20 mg by mouth daily. 06/18/19   [provider]  Semaglutide, 1 MG/DOSE, 2 MG/1.5ML SOPN Inject 1 mg into the skin every Thursday.    [provider]  sildenafil (VIAGRA) 100 MG tablet Take 100 mg by mouth daily as needed for erectile dysfunction. 06/24/19   [provider]  spironolactone (ALDACTONE) 25 MG tablet Take 25 mg by mouth daily. 06/18/19   [provider]  traMADol (ULTRAM) 50 MG tablet Take 50 mg by mouth every 6 (six) hours as needed for moderate pain.    [provider]  vitamin B-12 (CYANOCOBALAMIN) 1000 MCG tablet Take 1,000 mcg by mouth daily.    [provider]    Allergies Patient has no known allergies.  No family history on file.  Social History Social History   Tobacco Use   Smoking status: Former Smoker  Quit date: 47    Years since quitting: 30.5   Smokeless tobacco: Never Used  Substance Use Topics   Alcohol use: Not on file   Drug use: Not on file    Review of Systems Constitutional: No fever/chills Eyes: No visual changes. ENT: No sore throat. Cardiovascular: Denies chest pain. Respiratory: Denies shortness of breath. Gastrointestinal: No abdominal pain.  No nausea, no vomiting. Musculoskeletal: Negative for muscle skeletal pain. Skin: Positive abrasion. Neurological: Negative for headaches, focal weakness or numbness.   ____________________________________________   PHYSICAL EXAM:  VITAL SIGNS: ED Triage Vitals    Enc Vitals Group     BP 11/30/19 1006 (!) 174/82     Pulse Rate 11/30/19 1006 79     Resp 11/30/19 1006 (!) 22     Temp 11/30/19 1006 98 F (36.7 C)     Temp Source 11/30/19 1006 Oral     SpO2 11/30/19 1006 96 %     Weight 11/30/19 0935 250 lb (113.4 kg)     Height 11/30/19 0935 5\' 10"  (1.778 m)     Head Circumference --      Peak Flow --      Pain Score --      Pain Loc --      Pain Edu? --      Excl. in GC? --     Constitutional: Alert and oriented. Well appearing and in no acute distress. Eyes: Conjunctivae are normal.  Head: Atraumatic. Neck: No stridor.   Cardiovascular: Normal rate, regular rhythm. Grossly normal heart sounds.  Good peripheral circulation. Respiratory: Normal respiratory effort.  No retractions. Lungs CTAB. Musculoskeletal: Moves upper and lower extremities static difficulty and patient is able to ambulate without any assistance. Neurologic:  Normal speech and language. No gross focal neurologic deficits are appreciated. No gait instability. Skin:  Skin is warm, dry.  Patient has an area on the upper medial aspect of his thigh near the groin area with a superficial abrasion that currently  has no active bleeding.  A hemostat dressing was applied to the area and there was no continued bleeding while dressings were being changed. Psychiatric: Mood and affect are normal. Speech and behavior are normal.  ____________________________________________   LABS (all labs ordered are listed, but only abnormal results are displayed)  Labs Reviewed - No data to display  PROCEDURES  Procedure(s) performed (including Critical Care):  Procedures   ____________________________________________   INITIAL IMPRESSION / ASSESSMENT AND PLAN / ED COURSE  As part of my medical decision making, I reviewed the following data within the electronic MEDICAL RECORD NUMBER Notes from prior ED visits and [place] Controlled Substance Database  79 year old male presents to the ED with  complaint of an abrasion that he scratched yesterday and now has been unable to control the bleeding due to taking Pradaxa.  On exam there is a superficial abrasion noted to the medial upper thigh.  Dressing was removed and no active bleeding was noted while uncovered.  A hemostat dressing was applied and patient is aware that he should leave this on for approximately 2 days before removing this dressing.  Also direct pressure will help control the bleeding.  He is to follow-up with his PCP if any continued problems.  ____________________________________________   FINAL CLINICAL IMPRESSION(S) / ED DIAGNOSES  Final diagnoses:  Abrasion, right thigh, initial encounter     ED Discharge Orders    None       Note:  This document was prepared using Dragon  voice recognition software and may include unintentional dictation errors.    Tommi Rumps, PA-C 11/30/19 1143    Arnaldo Natal, MD 11/30/19 [phone]

## 2019-11-30 NOTE — ED Notes (Signed)
See triage note   States he had an itch yesterday  Scratched it and developed some bleeding  States the area was on his buttocks  Pt is on blood thinners

## 2019-11-30 NOTE — ED Triage Notes (Signed)
Pt states scratched right buttock and caused an abrasion that is now bleeding.  States bleeding will not stop and that he is on blood thinners. Pt state when he is sitting bleeding is better because of pressure.

## 2020-01-09 ENCOUNTER — Other Ambulatory Visit: Payer: Self-pay | Admitting: Orthopedic Surgery

## 2020-01-09 DIAGNOSIS — Z96659 Presence of unspecified artificial knee joint: Secondary | ICD-10-CM

## 2020-01-10 ENCOUNTER — Other Ambulatory Visit: Payer: Self-pay

## 2020-01-10 ENCOUNTER — Emergency Department: Payer: Medicare Other

## 2020-01-10 ENCOUNTER — Inpatient Hospital Stay
Admission: EM | Admit: 2020-01-10 | Discharge: 2020-01-22 | DRG: 466 | Disposition: A | Discharge status: Skilled Nursing Facility | Payer: Medicare Other | Attending: Internal Medicine | Admitting: Internal Medicine

## 2020-01-10 ENCOUNTER — Encounter: Payer: Self-pay | Admitting: Emergency Medicine

## 2020-01-10 DIAGNOSIS — Z79891 Long term (current) use of opiate analgesic: Secondary | ICD-10-CM | POA: Diagnosis not present

## 2020-01-10 DIAGNOSIS — Z951 Presence of aortocoronary bypass graft: Secondary | ICD-10-CM

## 2020-01-10 DIAGNOSIS — E1122 Type 2 diabetes mellitus with diabetic chronic kidney disease: Secondary | ICD-10-CM | POA: Diagnosis present

## 2020-01-10 DIAGNOSIS — I5043 Acute on chronic combined systolic (congestive) and diastolic (congestive) heart failure: Secondary | ICD-10-CM | POA: Diagnosis present

## 2020-01-10 DIAGNOSIS — K921 Melena: Secondary | ICD-10-CM | POA: Diagnosis present

## 2020-01-10 DIAGNOSIS — I4892 Unspecified atrial flutter: Secondary | ICD-10-CM | POA: Diagnosis present

## 2020-01-10 DIAGNOSIS — R531 Weakness: Secondary | ICD-10-CM | POA: Diagnosis present

## 2020-01-10 DIAGNOSIS — Z6841 Body Mass Index (BMI) 40.0 and over, adult: Secondary | ICD-10-CM | POA: Diagnosis not present

## 2020-01-10 DIAGNOSIS — N1831 Chronic kidney disease, stage 3a: Secondary | ICD-10-CM | POA: Diagnosis present

## 2020-01-10 DIAGNOSIS — Z9049 Acquired absence of other specified parts of digestive tract: Secondary | ICD-10-CM

## 2020-01-10 DIAGNOSIS — B952 Enterococcus as the cause of diseases classified elsewhere: Secondary | ICD-10-CM | POA: Diagnosis present

## 2020-01-10 DIAGNOSIS — I4891 Unspecified atrial fibrillation: Secondary | ICD-10-CM | POA: Diagnosis not present

## 2020-01-10 DIAGNOSIS — Z825 Family history of asthma and other chronic lower respiratory diseases: Secondary | ICD-10-CM

## 2020-01-10 DIAGNOSIS — I248 Other forms of acute ischemic heart disease: Secondary | ICD-10-CM | POA: Diagnosis present

## 2020-01-10 DIAGNOSIS — Z7901 Long term (current) use of anticoagulants: Secondary | ICD-10-CM | POA: Diagnosis not present

## 2020-01-10 DIAGNOSIS — Z7982 Long term (current) use of aspirin: Secondary | ICD-10-CM

## 2020-01-10 DIAGNOSIS — Z79899 Other long term (current) drug therapy: Secondary | ICD-10-CM

## 2020-01-10 DIAGNOSIS — Z96653 Presence of artificial knee joint, bilateral: Secondary | ICD-10-CM

## 2020-01-10 DIAGNOSIS — R0989 Other specified symptoms and signs involving the circulatory and respiratory systems: Secondary | ICD-10-CM | POA: Diagnosis present

## 2020-01-10 DIAGNOSIS — E119 Type 2 diabetes mellitus without complications: Secondary | ICD-10-CM

## 2020-01-10 DIAGNOSIS — R000 Tachycardia, unspecified: Secondary | ICD-10-CM | POA: Diagnosis present

## 2020-01-10 DIAGNOSIS — Z87891 Personal history of nicotine dependence: Secondary | ICD-10-CM | POA: Diagnosis not present

## 2020-01-10 DIAGNOSIS — I2510 Atherosclerotic heart disease of native coronary artery without angina pectoris: Secondary | ICD-10-CM | POA: Diagnosis present

## 2020-01-10 DIAGNOSIS — K922 Gastrointestinal hemorrhage, unspecified: Secondary | ICD-10-CM

## 2020-01-10 DIAGNOSIS — Y831 Surgical operation with implant of artificial internal device as the cause of abnormal reaction of the patient, or of later complication, without mention of misadventure at the time of the procedure: Secondary | ICD-10-CM | POA: Diagnosis present

## 2020-01-10 DIAGNOSIS — N179 Acute kidney failure, unspecified: Secondary | ICD-10-CM | POA: Diagnosis not present

## 2020-01-10 DIAGNOSIS — K648 Other hemorrhoids: Secondary | ICD-10-CM | POA: Diagnosis present

## 2020-01-10 DIAGNOSIS — M25461 Effusion, right knee: Secondary | ICD-10-CM

## 2020-01-10 DIAGNOSIS — N1832 Chronic kidney disease, stage 3b: Secondary | ICD-10-CM

## 2020-01-10 DIAGNOSIS — M25562 Pain in left knee: Secondary | ICD-10-CM

## 2020-01-10 DIAGNOSIS — Z20822 Contact with and (suspected) exposure to covid-19: Secondary | ICD-10-CM | POA: Diagnosis present

## 2020-01-10 DIAGNOSIS — M659 Synovitis and tenosynovitis, unspecified: Secondary | ICD-10-CM | POA: Diagnosis present

## 2020-01-10 DIAGNOSIS — B9561 Methicillin susceptible Staphylococcus aureus infection as the cause of diseases classified elsewhere: Secondary | ICD-10-CM | POA: Diagnosis present

## 2020-01-10 DIAGNOSIS — Z820 Family history of epilepsy and other diseases of the nervous system: Secondary | ICD-10-CM

## 2020-01-10 DIAGNOSIS — M009 Pyogenic arthritis, unspecified: Secondary | ICD-10-CM

## 2020-01-10 DIAGNOSIS — I130 Hypertensive heart and chronic kidney disease with heart failure and stage 1 through stage 4 chronic kidney disease, or unspecified chronic kidney disease: Secondary | ICD-10-CM | POA: Diagnosis present

## 2020-01-10 DIAGNOSIS — K253 Acute gastric ulcer without hemorrhage or perforation: Secondary | ICD-10-CM | POA: Diagnosis not present

## 2020-01-10 DIAGNOSIS — D649 Anemia, unspecified: Secondary | ICD-10-CM | POA: Diagnosis not present

## 2020-01-10 DIAGNOSIS — K319 Disease of stomach and duodenum, unspecified: Secondary | ICD-10-CM | POA: Diagnosis present

## 2020-01-10 DIAGNOSIS — T8454XA Infection and inflammatory reaction due to internal left knee prosthesis, initial encounter: Principal | ICD-10-CM | POA: Diagnosis present

## 2020-01-10 DIAGNOSIS — T8450XA Infection and inflammatory reaction due to unspecified internal joint prosthesis, initial encounter: Secondary | ICD-10-CM | POA: Diagnosis not present

## 2020-01-10 DIAGNOSIS — I071 Rheumatic tricuspid insufficiency: Secondary | ICD-10-CM | POA: Diagnosis present

## 2020-01-10 DIAGNOSIS — D62 Acute posthemorrhagic anemia: Secondary | ICD-10-CM | POA: Diagnosis present

## 2020-01-10 DIAGNOSIS — Z955 Presence of coronary angioplasty implant and graft: Secondary | ICD-10-CM

## 2020-01-10 DIAGNOSIS — E785 Hyperlipidemia, unspecified: Secondary | ICD-10-CM | POA: Diagnosis present

## 2020-01-10 DIAGNOSIS — R778 Other specified abnormalities of plasma proteins: Secondary | ICD-10-CM

## 2020-01-10 DIAGNOSIS — E669 Obesity, unspecified: Secondary | ICD-10-CM | POA: Diagnosis present

## 2020-01-10 DIAGNOSIS — I10 Essential (primary) hypertension: Secondary | ICD-10-CM | POA: Diagnosis not present

## 2020-01-10 DIAGNOSIS — K5730 Diverticulosis of large intestine without perforation or abscess without bleeding: Secondary | ICD-10-CM | POA: Diagnosis not present

## 2020-01-10 DIAGNOSIS — Z9081 Acquired absence of spleen: Secondary | ICD-10-CM

## 2020-01-10 DIAGNOSIS — Z980 Intestinal bypass and anastomosis status: Secondary | ICD-10-CM

## 2020-01-10 DIAGNOSIS — Z833 Family history of diabetes mellitus: Secondary | ICD-10-CM

## 2020-01-10 DIAGNOSIS — R7989 Other specified abnormal findings of blood chemistry: Secondary | ICD-10-CM

## 2020-01-10 DIAGNOSIS — Z8249 Family history of ischemic heart disease and other diseases of the circulatory system: Secondary | ICD-10-CM

## 2020-01-10 DIAGNOSIS — I509 Heart failure, unspecified: Secondary | ICD-10-CM

## 2020-01-10 DIAGNOSIS — Z539 Procedure and treatment not carried out, unspecified reason: Secondary | ICD-10-CM | POA: Diagnosis not present

## 2020-01-10 DIAGNOSIS — I5023 Acute on chronic systolic (congestive) heart failure: Secondary | ICD-10-CM | POA: Diagnosis not present

## 2020-01-10 DIAGNOSIS — I358 Other nonrheumatic aortic valve disorders: Secondary | ICD-10-CM | POA: Diagnosis present

## 2020-01-10 DIAGNOSIS — D729 Disorder of white blood cells, unspecified: Secondary | ICD-10-CM

## 2020-01-10 DIAGNOSIS — I5031 Acute diastolic (congestive) heart failure: Secondary | ICD-10-CM | POA: Diagnosis not present

## 2020-01-10 DIAGNOSIS — Z7984 Long term (current) use of oral hypoglycemic drugs: Secondary | ICD-10-CM

## 2020-01-10 LAB — BASIC METABOLIC PANEL
Anion gap: 11 (ref 5–15)
BUN: 43 mg/dL — ABNORMAL HIGH (ref 8–23)
CO2: 21 mmol/L — ABNORMAL LOW (ref 22–32)
Calcium: 8.5 mg/dL — ABNORMAL LOW (ref 8.9–10.3)
Chloride: 104 mmol/L (ref 98–111)
Creatinine, Ser: 1.77 mg/dL — ABNORMAL HIGH (ref 0.61–1.24)
GFR calc Af Amer: 42 mL/min — ABNORMAL LOW (ref >60)
GFR calc non Af Amer: 36 mL/min — ABNORMAL LOW (ref >60)
Glucose, Bld: 199 mg/dL — ABNORMAL HIGH (ref 70–99)
Potassium: 4.5 mmol/L (ref 3.5–5.1)
Sodium: 136 mmol/L (ref 135–145)

## 2020-01-10 LAB — CBC WITH DIFFERENTIAL/PLATELET
Abs Immature Granulocytes: 0.42 10*3/uL — ABNORMAL HIGH (ref 0.00–0.07)
Basophils Absolute: 0 10*3/uL (ref 0.0–0.1)
Basophils Relative: 0 %
Eosinophils Absolute: 0 10*3/uL (ref 0.0–0.5)
Eosinophils Relative: 0 %
HCT: 25.7 % — ABNORMAL LOW (ref 39.0–52.0)
Hemoglobin: 8 g/dL — ABNORMAL LOW (ref 13.0–17.0)
Immature Granulocytes: 2 %
Lymphocytes Relative: 4 %
Lymphs Abs: 0.7 10*3/uL (ref 0.7–4.0)
MCH: 26.1 pg (ref 26.0–34.0)
MCHC: 31.1 g/dL (ref 30.0–36.0)
MCV: 84 fL (ref 80.0–100.0)
Monocytes Absolute: 1.2 10*3/uL — ABNORMAL HIGH (ref 0.1–1.0)
Monocytes Relative: 7 %
Neutro Abs: 15.7 10*3/uL — ABNORMAL HIGH (ref 1.7–7.7)
Neutrophils Relative %: 87 %
Platelets: 426 10*3/uL — ABNORMAL HIGH (ref 150–400)
RBC: 3.06 MIL/uL — ABNORMAL LOW (ref 4.22–5.81)
RDW: 16.6 % — ABNORMAL HIGH (ref 11.5–15.5)
WBC: 18.1 10*3/uL — ABNORMAL HIGH (ref 4.0–10.5)
nRBC: 0.1 % (ref 0.0–0.2)

## 2020-01-10 LAB — TROPONIN I (HIGH SENSITIVITY)
Troponin I (High Sensitivity): 34 ng/L — ABNORMAL HIGH (ref <18)
Troponin I (High Sensitivity): 42 ng/L — ABNORMAL HIGH (ref <18)

## 2020-01-10 LAB — LACTIC ACID, PLASMA
Lactic Acid, Venous: 2 mmol/L (ref 0.5–1.9)
Lactic Acid, Venous: 1.3 mmol/L (ref 0.5–1.9)

## 2020-01-10 LAB — PROTIME-INR
INR: 1.9 — ABNORMAL HIGH (ref 0.8–1.2)
Prothrombin Time: 20.9 seconds — ABNORMAL HIGH (ref 11.4–15.2)

## 2020-01-10 LAB — BRAIN NATRIURETIC PEPTIDE: B Natriuretic Peptide: 401.2 pg/mL — ABNORMAL HIGH (ref 0.0–100.0)

## 2020-01-10 MED ORDER — ONDANSETRON HCL 4 MG/2ML IJ SOLN
4.0000 mg | Freq: Four times a day (QID) | INTRAMUSCULAR | Status: DC | PRN
  Administered 2020-01-11 (×2): 4 mg via INTRAVENOUS
  Filled 2020-01-10 (×2): qty 2

## 2020-01-10 MED ORDER — PIPERACILLIN-TAZOBACTAM 3.375 G IVPB 30 MIN
3.3750 g | Freq: Once | INTRAVENOUS | Status: AC
  Administered 2020-01-10: 3.375 g via INTRAVENOUS
  Filled 2020-01-10: qty 50

## 2020-01-10 MED ORDER — SODIUM CHLORIDE 0.9 % IV BOLUS (SEPSIS)
500.0000 mL | Freq: Once | INTRAVENOUS | Status: AC
  Administered 2020-01-10: 500 mL via INTRAVENOUS

## 2020-01-10 MED ORDER — SODIUM CHLORIDE 0.9 % IV SOLN
80.0000 mg | Freq: Once | INTRAVENOUS | Status: AC
  Administered 2020-01-10: 80 mg via INTRAVENOUS
  Filled 2020-01-10: qty 80

## 2020-01-10 MED ORDER — ONDANSETRON HCL 4 MG PO TABS: 4.0000 mg | ORAL_TABLET | Freq: Four times a day (QID) | ORAL | Status: DC | PRN

## 2020-01-10 MED ORDER — INSULIN ASPART 100 UNIT/ML ~~LOC~~ SOLN
0.0000 [IU] | SUBCUTANEOUS | Status: DC
  Administered 2020-01-11: 3 [IU] via SUBCUTANEOUS
  Administered 2020-01-11: 2 [IU] via SUBCUTANEOUS
  Administered 2020-01-11: 3 [IU] via SUBCUTANEOUS
  Administered 2020-01-11: 2 [IU] via SUBCUTANEOUS
  Administered 2020-01-11: 3 [IU] via SUBCUTANEOUS
  Administered 2020-01-12 (×3): 2 [IU] via SUBCUTANEOUS
  Administered 2020-01-12: 3 [IU] via SUBCUTANEOUS
  Administered 2020-01-12 (×2): 2 [IU] via SUBCUTANEOUS
  Administered 2020-01-13: 3 [IU] via SUBCUTANEOUS
  Administered 2020-01-13: 2 [IU] via SUBCUTANEOUS
  Administered 2020-01-13: 3 [IU] via SUBCUTANEOUS
  Administered 2020-01-14 (×2): 2 [IU] via SUBCUTANEOUS
  Administered 2020-01-14: 5 [IU] via SUBCUTANEOUS
  Administered 2020-01-14: 4 [IU] via SUBCUTANEOUS
  Administered 2020-01-15: 5 [IU] via SUBCUTANEOUS
  Administered 2020-01-15 (×2): 2 [IU] via SUBCUTANEOUS
  Administered 2020-01-16: 5 [IU] via SUBCUTANEOUS
  Administered 2020-01-17 (×3): 3 [IU] via SUBCUTANEOUS
  Administered 2020-01-17: 5 [IU] via SUBCUTANEOUS
  Administered 2020-01-17 (×2): 3 [IU] via SUBCUTANEOUS
  Administered 2020-01-18: 2 [IU] via SUBCUTANEOUS
  Administered 2020-01-18: 3 [IU] via SUBCUTANEOUS
  Administered 2020-01-18 (×2): 2 [IU] via SUBCUTANEOUS
  Administered 2020-01-18 – 2020-01-19 (×4): 3 [IU] via SUBCUTANEOUS
  Administered 2020-01-19: 2 [IU] via SUBCUTANEOUS
  Administered 2020-01-19: 3 [IU] via SUBCUTANEOUS
  Administered 2020-01-19: 2 [IU] via SUBCUTANEOUS
  Administered 2020-01-19: 3 [IU] via SUBCUTANEOUS
  Administered 2020-01-20: 2 [IU] via SUBCUTANEOUS
  Administered 2020-01-20 (×2): 3 [IU] via SUBCUTANEOUS
  Administered 2020-01-20 (×2): 2 [IU] via SUBCUTANEOUS
  Administered 2020-01-20 – 2020-01-21 (×3): 3 [IU] via SUBCUTANEOUS
  Administered 2020-01-21 (×3): 2 [IU] via SUBCUTANEOUS
  Administered 2020-01-21 – 2020-01-22 (×2): 3 [IU] via SUBCUTANEOUS
  Administered 2020-01-22 (×2): 2 [IU] via SUBCUTANEOUS
  Filled 2020-01-10 (×53): qty 1

## 2020-01-10 MED ORDER — SODIUM CHLORIDE 0.9 % IV SOLN
8.0000 mg/h | INTRAVENOUS | Status: AC
  Administered 2020-01-11 – 2020-01-13 (×7): 8 mg/h via INTRAVENOUS
  Filled 2020-01-10 (×7): qty 80

## 2020-01-10 MED ORDER — FUROSEMIDE 10 MG/ML IJ SOLN
20.0000 mg | Freq: Two times a day (BID) | INTRAMUSCULAR | Status: DC
  Administered 2020-01-11: 20 mg via INTRAVENOUS
  Filled 2020-01-10: qty 4

## 2020-01-10 MED ORDER — ACETAMINOPHEN 650 MG RE SUPP: 650.0000 mg | Freq: Four times a day (QID) | RECTAL | Status: DC | PRN

## 2020-01-10 MED ORDER — SODIUM CHLORIDE 0.9 % IV SOLN
INTRAVENOUS | Status: DC
  Administered 2020-01-15: 20 mL/h via INTRAVENOUS

## 2020-01-10 MED ORDER — VANCOMYCIN HCL IN DEXTROSE 1-5 GM/200ML-% IV SOLN
1000.0000 mg | Freq: Once | INTRAVENOUS | Status: AC
  Administered 2020-01-11: 1000 mg via INTRAVENOUS
  Filled 2020-01-10: qty 200

## 2020-01-10 MED ORDER — ACETAMINOPHEN 325 MG PO TABS: 650.0000 mg | ORAL_TABLET | Freq: Four times a day (QID) | ORAL | Status: DC | PRN

## 2020-01-10 MED ORDER — MORPHINE SULFATE (PF) 2 MG/ML IV SOLN
2.0000 mg | INTRAVENOUS | Status: DC | PRN
  Administered 2020-01-11 – 2020-01-19 (×14): 2 mg via INTRAVENOUS
  Filled 2020-01-10 (×14): qty 1

## 2020-01-10 MED ORDER — HYDROCODONE-ACETAMINOPHEN 5-325 MG PO TABS
1.0000 | ORAL_TABLET | ORAL | Status: DC | PRN
  Administered 2020-01-12 – 2020-01-13 (×6): 2 via ORAL
  Administered 2020-01-14 – 2020-01-15 (×3): 1 via ORAL
  Administered 2020-01-15 – 2020-01-16 (×2): 2 via ORAL
  Administered 2020-01-19: 1 via ORAL
  Administered 2020-01-19 – 2020-01-22 (×8): 2 via ORAL
  Filled 2020-01-10 (×2): qty 2
  Filled 2020-01-10: qty 1
  Filled 2020-01-10 (×7): qty 2
  Filled 2020-01-10: qty 1
  Filled 2020-01-10 (×2): qty 2
  Filled 2020-01-10: qty 1
  Filled 2020-01-10 (×4): qty 2
  Filled 2020-01-10: qty 1
  Filled 2020-01-10: qty 2

## 2020-01-10 MED ORDER — PANTOPRAZOLE SODIUM 40 MG IV SOLR
40.0000 mg | Freq: Two times a day (BID) | INTRAVENOUS | Status: DC
  Administered 2020-01-14 – 2020-01-15 (×3): 40 mg via INTRAVENOUS
  Filled 2020-01-10 (×3): qty 40

## 2020-01-10 NOTE — ED Triage Notes (Addendum)
Patient states he went to the doctor today because his, "knees gave out".  Patient states they did lab work and found that his hemoglobin was low and his white blood cell count was high.  Patient reports dark stools recently.  Patient also has swelling to his right leg.  Patient reports history of colon surgery.  Patient states, "I feel pretty good."

## 2020-01-10 NOTE — ED Provider Notes (Signed)
[facility] Emergency Department Provider Note ____________________________________________   First MD Initiated Contact with Patient 01/10/20 2131     (approximate)  I have reviewed the triage vital signs and the nursing notes.  HISTORY  Chief Complaint GI Bleeding   HPI Randall Vincent is a 79 y.o. malewho presents to the ED for evaluation of possible GI bleed.  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  CAD status post CABG.  Cardiology note from 7/28 indicates reducing the dose of his Pradaxa from 150 to 75 mg.  Patient reports seeing his PCP today as an outpatient due to 2 weeks of progressive weakness, left knee pain, an episode of melena and his knee giving out on him. Patient reports a single episode of melena yesterday, otherwise no hematochezia, hematemesis or coffee-ground emesis.  Patient denies abdominal pain.  Denies any bowel movements since his melanotic stool yesterday.  Patient reports occasional lightheaded dizziness with presyncope without syncope.  Denies chest pain, cough, shortness of breath, fever. Patient reports that he has never had an EGD.  Regarding his left knee, patient reports remotely having a total knee replacement many years ago.  Patient reports having no issues with his joint until developing pain and swelling to his left knee over the past 1 week.  Patient reports pain with movement of his left knee and subjective chills at home, but no fevers.  No recent antibiotic use, injuries to the area, injections or trauma.    Past Medical History:  Diagnosis Date  . A-fib (HCC)   . Diabetes (HCC)   . Hyperlipidemia   . Hypertension     Patient Active Problem List   Diagnosis Date Noted  . Melena 01/10/2020  . Symptomatic anemia 01/10/2020  . Acute blood loss anemia 01/10/2020  . Suspect Septic arthritis (HCC) 01/10/2020  . Chronic anticoagulation 01/10/2020  . History of bilateral  knee replacement 01/10/2020  . Chronic kidney disease, stage 3a 01/10/2020  . Acute on chronic congestive heart failure (HCC) 01/10/2020  . Chest pain 06/26/2019  . CAD, multiple vessel 06/26/2019  . 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 Atrial fibrillation status post cardioversion 10/22/19 (HCC) 06/26/2019  . Essential hypertension 06/26/2019  . DM (diabetes mellitus), type 2 (HCC) 06/26/2019  . Anginal equivalent (HCC) 06/26/2019    Past Surgical History:  Procedure Laterality Date  . BLADDER SURGERY    . BYPASS GRAFT    . CARDIOVERSION    . COLON SURGERY    . REPLACEMENT TOTAL KNEE BILATERAL    . STENT PLACEMENT VASCULAR ([facility] HX)      Prior to Admission medications   Medication Sig Start Date End Date Taking? Authorizing Provider  acetaminophen (TYLENOL) 500 MG tablet Take 500-1,000 mg by mouth every 6 (six) hours as needed for mild pain or fever.    [provider]  albuterol (VENTOLIN HFA) 108 (90 Base) MCG/ACT inhaler Inhale 2 puffs into the lungs every 6 (six) hours as needed for wheezing.    [provider]  aspirin 325 MG tablet Take 325 mg by mouth daily.    [provider]  atorvastatin (LIPITOR) 40 MG tablet Take 40 mg by mouth daily. 06/18/19   [provider]  dabigatran (PRADAXA) 150 MG CAPS capsule Take 150 mg by mouth 2 (two) times daily.    [provider]  diltiazem (CARDIZEM CD) 120 MG 24 hr capsule Take 1 capsule (120 mg total) by mouth  daily. 06/28/19   Zannie CoveJoseph, Preetha, MD  fluticasone [facility](FLONASE) 50 MCG/ACT nasal spray Place 2 sprays into both nostrils daily as needed for allergies or rhinitis.    [provider]  metFORMIN (GLUCOPHAGE-XR) 500 MG 24 hr tablet Take 1,000 mg by mouth daily. 05/20/19   [provider]  ramipril (ALTACE) 10 MG capsule Take 20 mg by mouth daily. 06/18/19   [provider]  Semaglutide, 1 MG/DOSE, 2 MG/1.5ML SOPN Inject 1 mg into the skin every Thursday.    [provider]  sildenafil (VIAGRA) 100 MG tablet Take 100 mg by mouth daily as needed for erectile dysfunction. 06/24/19   [provider]  spironolactone (ALDACTONE) 25 MG tablet Take 25 mg by mouth daily. 06/18/19   [provider]  traMADol (ULTRAM) 50 MG tablet Take 50 mg by mouth every 6 (six) hours as needed for moderate pain.    [provider]  vitamin B-12 (CYANOCOBALAMIN) 1000 MCG tablet Take 1,000 mcg by mouth daily.    [provider]    Allergies Patient has no known allergies.  No family history on file.  Social History Social History   Tobacco Use  . Smoking status: Former Smoker    Quit date: 1991    Years since quitting: 30.6  . Smokeless tobacco: Never Used  Substance Use Topics  . Alcohol use: Not on file  . Drug use: Not on file    Review of Systems  Constitutional: No fever but positive for chills Eyes: No visual changes. ENT: No sore throat. Cardiovascular: Denies chest pain. Respiratory: Denies shortness of breath. Gastrointestinal: No abdominal pain.  No nausea, no vomiting.  No diarrhea.  No constipation. Positive for melena Genitourinary: Negative for dysuria. Musculoskeletal: Negative for back pain.  Positive for left knee pain and swelling. Skin: Negative for rash. Neurological: Negative for headaches, focal weakness or numbness.   ____________________________________________   PHYSICAL EXAM:  VITAL SIGNS: Vitals:   01/10/20 1826 01/10/20 1917  BP: (!) 168/78 (!) 157/86  Pulse: (!) 105 99  Resp: 18 17  Temp: 99.2 F (37.3 C) 98.8 F (37.1 C)  SpO2: 98% 97%      Constitutional: Alert and oriented.  Obese.  Supine in bed and conversational full sentences.  Pleasant.  No acute distress. Eyes: Conjunctivae are normal. PERRL. EOMI. Head: Atraumatic. Nose: No congestion/rhinnorhea. Mouth/Throat: Mucous membranes are moist and pale.  Oropharynx non-erythematous. Neck: No stridor. No cervical spine tenderness to  palpation. Cardiovascular: Tachycardic rate, regular rhythm. Grossly normal heart sounds.  Good peripheral circulation. Respiratory: Normal respiratory effort.  No retractions. Lungs CTAB. Gastrointestinal: Soft , nondistended, nontender to palpation. No abdominal bruits. No CVA tenderness. Musculoskeletal: No signs of acute trauma. Left knee with mild soft tissue swelling, no overlying skin changes or signs of trauma.  Impaired passive range of motion to the knee due to pain.  Left knee is warm to the touch, but no overlying erythema or skin changes.  Distally neurovascularly intact. In comparison, the right knee is not warm to the touch and his range of motion is improved. Neurologic:  Normal speech and language. No gross focal neurologic deficits are appreciated. No gait instability noted. Skin:  Skin is warm, dry and intact. No rash noted. Psychiatric: Mood and affect are normal. Speech and behavior are normal.  ____________________________________________   LABS (all labs ordered are listed, but only abnormal results are displayed)  Labs Reviewed  BRAIN NATRIURETIC PEPTIDE - Abnormal; Notable for the following components:  Result Value   B Natriuretic Peptide 401.2 (*)    All other components within normal limits  LACTIC ACID, PLASMA - Abnormal; Notable for the following components:   Lactic Acid, Venous 2.0 (*)    All other components within normal limits  BASIC METABOLIC PANEL - Abnormal; Notable for the following components:   CO2 21 (*)    Glucose, Bld 199 (*)    BUN 43 (*)    Creatinine, Ser 1.77 (*)    Calcium 8.5 (*)    GFR calc non Af Amer 36 (*)    GFR calc Af Amer 42 (*)    All other components within normal limits  CBC WITH DIFFERENTIAL/PLATELET - Abnormal; Notable for the following components:   WBC 18.1 (*)    RBC 3.06 (*)    Hemoglobin 8.0 (*)    HCT 25.7 (*)    RDW 16.6 (*)    Platelets 426 (*)    Neutro Abs 15.7 (*)    Monocytes Absolute 1.2 (*)     Abs Immature Granulocytes 0.42 (*)    All other components within normal limits  PROTIME-INR - Abnormal; Notable for the following components:   Prothrombin Time 20.9 (*)    INR 1.9 (*)    All other components within normal limits  TROPONIN I (HIGH SENSITIVITY) - Abnormal; Notable for the following components:   Troponin I (High Sensitivity) 34 (*)    All other components within normal limits  TROPONIN I (HIGH SENSITIVITY) - Abnormal; Notable for the following components:   Troponin I (High Sensitivity) 42 (*)    All other components within normal limits  LACTIC ACID, PLASMA  TYPE AND SCREEN   ____________________________________________  12 Lead EKG  Sinus rhythm, rate of 105 bpm, normal axis and intervals.  No evidence of acute ischemia. ____________________________________________  RADIOLOGY  ED MD interpretation: 2 view CXR with evidence of pulmonary vascular congestion  Official radiology report(s): DG Chest 2 View  Result Date: 01/10/2020 CLINICAL DATA:  Weakness EXAM: CHEST - 2 VIEW COMPARISON:  06/26/2019 FINDINGS: Hazy interstitial opacities with a mid to lower lung predominance with some slight vascular cephalization/congestion. No pneumothorax. No effusion. No focal consolidation. Cardiomegaly appearance is similar to priors with evidence of prior sternotomy and CABG and a calcified, tortuous aorta. Cardiomediastinal contours are otherwise unremarkable. No acute osseous or soft tissue abnormality. Degenerative changes are present in the imaged spine and shoulders. IMPRESSION: Features of CHF/volume overload with cardiomegaly and findings suggesting mild interstitial pulmonary edema. Electronically Signed   By: Kreg Shropshire M.D.   On: 01/10/2020 19:19    ____________________________________________   PROCEDURES and INTERVENTIONS  Procedure(s) performed (including Critical Care):  Procedures  Medications  pantoprazole (PROTONIX) 80 mg in sodium chloride 0.9 % 100 mL  IVPB (has no administration in time range)  pantoprazole (PROTONIX) 80 mg in sodium chloride 0.9 % 100 mL (0.8 mg/mL) infusion (has no administration in time range)  pantoprazole (PROTONIX) injection 40 mg (has no administration in time range)    ____________________________________________   MDM / ED COURSE  79 year old male anticoagulated on Pradaxa due to atrial flutter presenting with generalized weakness and melena consistent with upper GI bleed requiring medical admission.  Patient tachycardic but hemodynamically stable.  Reassuring examination with the patient with no acute distress and a benign abdomen.  He has no evidence of neurovascular deficits or trauma.  Blood work demonstrates a significant drop in hemoglobin from our last reading in our system, now down to 8.0.  Blood work with further evidence of AKI and mild lactic acidosis.  Protonix ordered and patient is amenable to blood transfusions, but I do not believe he requires one at this time.  We will admit the patient to hospitalist medicine for further work-up and management of his upper GI bleed.  Of note, patient's left knee is swollen and warm to the touch.  Patient is reporting increasing pain to this area for the past 1 week that is atraumatic.  Patient's leukocytosis may be caused by a concomitant septic arthritis of his left knee.  Due to patient's history of TKA, I did not perform arthrocentesis to help rule this out.  I spoke with admitting hospitalist about the possibility of a septic joint as a source of his pain and leukocytosis.  We discussed the utility of initiating antibiotics at this time, and decided on the hospitalist further digging through patient's history and speaking with orthopedics.  Due to patient not meeting sepsis criteria at this time, and definitive treatment for severe arthritis is surgical washout, I believe it is safe and reasonable to wait at this time and hold on antibiotics until orthopedic  evaluation. ____________________________________________   FINAL CLINICAL IMPRESSION(S) / ED DIAGNOSES  Final diagnoses:  Upper GI bleed  Anticoagulated  Acute pain of left knee  AKI (acute kidney injury) (HCC)  Neutrophilia     ED Discharge Orders    None       Codi Kertz   Note:  This document was prepared using Conservation officer, historic buildings and may include unintentional dictation errors.   Delton Prairie, MD 01/10/20 [phone]

## 2020-01-10 NOTE — H&P (Addendum)
History and Physical    Randall HaysGeorge Vincent RUE:[id number] DOB: 07/25/1940 DOA: 01/10/2020  PCP: Elspeth Choerrell, Grace E., MD   Patient coming from: home  I have personally briefly reviewed patient's old medical records in [facility]  Chief Complaint: right knee pain, low hemoglobin  HPI: Randall HaysGeorge Vincent is a 79 y.o. male with medical history significant for CAD status post CABG, CHF, DM 2, HTN, A. fib status post cardioversion on 10/22/2019, on Pradaxa, recently switched to Eliquis on 12/18/2019 secondary to worsening of his CKD IIIa, status post bilateral knee replacement several years ago with recent complaints over the past several weeks of bilateral leg swelling from the knees down but severe left knee pain impairing weightbearing, recently seen by [facility] on 01/07/2020 with concerns for prosthetic loosening versus infection and bone scan was ordered.  [facility] for anemia and elevated WBC.  Has been having black stool in the 24 hours prior to presentation.  Repeat blood work showed low hemoglobin and patient was sent to the emergency room for evaluation.  Patient did report feeling weakness over the past couple weeks but denies shortness of breath beyond his baseline. ED Course: On arrival, temperature 99.2 with tachycardia of 105, BP 168/78.  Hemoglobin 8.0 with last hemoglobin on file 13.5 back in February 2021.  Creatinine 1.77 slightly elevated from baseline of 1.44 and February 2021.  WBC 18,000 with lactic acid of 2.0.  Troponin 34>>>42, with BNP of 401.  Chest x-ray showing pulmonary vascular congestion.  Hospitalist consulted for admission.  Review of Systems: As per HPI otherwise all other systems on review of systems negative.    Past Medical History:  Diagnosis Date  . A-fib (HCC)   . Diabetes (HCC)   . Hyperlipidemia   . Hypertension     Past Surgical History:  Procedure Laterality Date  . BLADDER SURGERY    . BYPASS GRAFT    . CARDIOVERSION    . COLON  SURGERY    . REPLACEMENT TOTAL KNEE BILATERAL    . STENT PLACEMENT VASCULAR ([facility] HX)       reports that he quit smoking about 30 years ago. He has never used smokeless tobacco. No history on file for alcohol use and drug use.  No Known Allergies  History reviewed. No pertinent family history.    Prior to Admission medications   Medication Sig Start Date End Date Taking? Authorizing Provider  acetaminophen (TYLENOL) 500 MG tablet Take 500-1,000 mg by mouth every 6 (six) hours as needed for mild pain or fever.    [provider]  albuterol (VENTOLIN HFA) 108 (90 Base) MCG/ACT inhaler Inhale 2 puffs into the lungs every 6 (six) hours as needed for wheezing.    [provider]  aspirin 325 MG tablet Take 325 mg by mouth daily.    [provider]  atorvastatin (LIPITOR) 40 MG tablet Take 40 mg by mouth daily. 06/18/19   [provider]  dabigatran (PRADAXA) 150 MG CAPS capsule Take 150 mg by mouth 2 (two) times daily.    [provider]  diltiazem (CARDIZEM CD) 120 MG 24 hr capsule Take 1 capsule (120 mg total) by mouth daily. 06/28/19   Zannie CoveJoseph, Preetha, MD  fluticasone [facility](FLONASE) 50 MCG/ACT nasal spray Place 2 sprays into both nostrils daily as needed for allergies or rhinitis.    [provider]  metFORMIN (GLUCOPHAGE-XR) 500 MG 24 hr tablet Take 1,000 mg by mouth daily. 05/20/19   [provider]  ramipril (ALTACE) 10 MG capsule Take 20 mg by mouth daily. 06/18/19   [provider]  Semaglutide, 1 MG/DOSE, 2 MG/1.5ML SOPN Inject 1 mg into the skin every Thursday.    [provider]  sildenafil (VIAGRA) 100 MG tablet Take 100 mg by mouth daily as needed for erectile dysfunction. 06/24/19   [provider]  spironolactone (ALDACTONE) 25 MG tablet Take 25 mg by mouth daily. 06/18/19   [provider]  traMADol (ULTRAM) 50 MG tablet Take 50 mg by mouth every 6 (six) hours as needed for moderate pain.     [provider]  vitamin B-12 (CYANOCOBALAMIN) 1000 MCG tablet Take 1,000 mcg by mouth daily.    [provider]    Physical Exam: Vitals:   01/10/20 1826 01/10/20 1917 01/10/20 2200  BP: (!) 168/78 (!) 157/86 (!) 153/63  Pulse: (!) 105 99 94  Resp: 18 17 (!) 25  Temp: 99.2 F (37.3 C) 98.8 F (37.1 C)   TempSrc: Oral Oral   SpO2: 98% 97% 98%  Weight: 108.9 kg    Height: 5\' 10"  (1.778 m)       Vitals:   01/10/20 1826 01/10/20 1917 01/10/20 2200  BP: (!) 168/78 (!) 157/86 (!) 153/63  Pulse: (!) 105 99 94  Resp: 18 17 (!) 25  Temp: 99.2 F (37.3 C) 98.8 F (37.1 C)   TempSrc: Oral Oral   SpO2: 98% 97% 98%  Weight: 108.9 kg    Height: 5\' 10"  (1.778 m)        Constitutional: Alert and oriented x 3 . Not in any apparent distress HEENT:      Head: Normocephalic and atraumatic.         Eyes: blind left eye. Right eye: PERLA, EOMI, Conjunctivae are normal. Sclera is non-icteric.       Mouth/Throat: Mucous membranes are moist.       Neck: Supple with no signs of meningismus. Cardiovascular: Regular rate and rhythm. No murmurs, gallops, or rubs. 2+ symmetrical distal pulses are present . No JVD. No 2-3+LE edema Respiratory: Respiratory effort normal slightly increased.Lungs sounds diminished with faint bibasilar crackles Gastrointestinal: Soft, non tender, and non distended with positive bowel sounds. No rebound or guarding. Genitourinary: No CVA tenderness. Musculoskeletal:  Swelling bilateral lower extremities up to knees.  Marked swelling with redness over left knee and with effusion, tender to palpation left knee with pain on range of motion Neurologic: Normal speech and language. Face is symmetric. Moving all extremities. No gross focal neurologic deficits . 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 Liver Function Tests: No results for input(s): AST, ALT, ALKPHOS, BILITOT, PROT, ALBUMIN in the last 168 hours. No results for input(s): LIPASE, AMYLASE in the last 168 hours. No results for input(s): AMMONIA in the last 168 hours. Coagulation Profile: Recent Labs  Lab  01/10/20 1837  INR 1.9*   Cardiac Enzymes: No results for input(s): CKTOTAL, CKMB, CKMBINDEX, TROPONINI in the last 168 hours. BNP (last 3 results) No results for input(s): PROBNP in the last 8760 hours. HbA1C: No results for input(s): HGBA1C in the last 72 hours. CBG: No results for input(s): GLUCAP in the last 168 hours. Lipid Profile: No results for input(s): CHOL, HDL, LDLCALC, TRIG, CHOLHDL, LDLDIRECT in the last 72 hours. Thyroid Function Tests: No results for input(s): TSH, T4TOTAL, FREET4, T3FREE, THYROIDAB in the last 72 hours. Anemia Panel: No results for input(s): VITAMINB12, FOLATE, FERRITIN, TIBC, IRON, RETICCTPCT in the last 72 hours. Urine analysis: No results found for: COLORURINE, APPEARANCEUR, LABSPEC, PHURINE, GLUCOSEU, HGBUR, BILIRUBINUR, KETONESUR, PROTEINUR, UROBILINOGEN, NITRITE, LEUKOCYTESUR  Radiological Exams on Admission: DG Chest 2 View  Result Date: 01/10/2020 CLINICAL DATA:  Weakness EXAM: CHEST - 2 VIEW COMPARISON:  06/26/2019 FINDINGS: Hazy interstitial opacities with a mid to lower lung predominance with some slight vascular cephalization/congestion. No pneumothorax. No effusion. No focal consolidation. Cardiomegaly appearance is similar to priors with evidence of prior sternotomy  and CABG and a calcified, tortuous aorta. Cardiomediastinal contours are otherwise unremarkable. No acute osseous or soft tissue abnormality. Degenerative changes are present in the imaged spine and shoulders. IMPRESSION: Features of CHF/volume overload with cardiomegaly and findings suggesting mild interstitial pulmonary edema. Electronically Signed   By: Kreg Shropshire M.D.   On: 01/10/2020 19:19    EKG: Independently reviewed. Interpretation : Sinus tachycardia with no acute ST-T wave changes  Assessment/Plan 79 year old male with history of CAD status post CABG, CHF, DM 2, HTN, A. fib status post ablation 10/22/2019, on Pradaxa, recently switched to Eliquis on 12/18/2019 secondary to worsening of his CKD IIIa, status post bilateral knee replacement several years ago presenting with knee pain, melena and symptomatic anemia    Acute blood loss anemia secondary to upper GI bleed/melena in the setting of chronic anticoagulation with Eliquis -Stop Eliquis -IV Protonix bolus and infusion -Serial H&H and transfuse if necessary -Monitor closely.  Patient currently hemodynamically stable -GI consult -N.p.o. after midnight for possible endoscopy in the a.m.  Suspect Left Septic arthritis (HCC)    History of bilateral knee replacement -Patient with history of knee replacement several years ago presenting with a several week history of worsening pain and swelling of the left knee -Bone scan was ordered from the outpatient uncertain if performed -We will get baseline diagnostic imaging with x-ray -Orthopedic consult to direct further imaging studies to evaluate for septic joint and to arrange for arthrocentesis -Empiric vancomycin and Zosyn  Mild acute on chronic congestive heart failure, unspecified (HCC)   Symptomatic anemia -Patient with mild shortness of breath above baseline, but pulmonary vascular congestion on chest x-ray, BNP elevated at 400 -Suspect triggered by acute blood loss anemia related  to GI bleed above -Echocardiogram in the a.m. no prior echoes found on chart review -Holding beta-blocker and ACE inhibitor in the setting of acute GI bleed and concern for hypotension -Low-dose Lasix only for overtly symptomatic dyspnea       Elevated troponin   CAD, multiple vessel   S/P CABG x 2 -Troponin 37>>> 42, suspect secondary to demand ischemia from above -Continue to monitor -Holding aspirin and metoprolol for now based on above acute issues -Might benefit from cardiology consult during hospitalization    Atrial fibrillation status post cardioversion 10/22/19 [facility]) -Patient currently in sinus rhythm -Recently switched from Pradaxa to Eliquis on 12/18/2019 due to concern for renal function -Eliquis  discontinued for now due to GI bleeding    Essential hypertension -Antihypertensives as BP with tolerate    DM (diabetes mellitus), type 2 (HCC) -Fingersticks every 4 while n.p.o. with sliding scale coverage    Chronic kidney disease, stage 3a -GFR at baseline -As mentioned above anticoagulant was switched from Pradaxa to Eliquis for preservation of renal function  DVT prophylaxis: SCDs Code Status: full code  Family Communication:  none  Disposition Plan: Back to previous home environment Consults called: GI, orthopedics Status:At the time of admission, it appears that the appropriate admission status for this patient is INPATIENT. This is judged to be reasonable and necessary in order to provide the required intensity of service to ensure the patient's safety given the presenting symptoms, physical exam findings, and initial radiographic and laboratory data in the context of their  Comorbid conditions.   Patient requires inpatient status due to high intensity of service, high risk for further deterioration and high frequency of surveillance required.   I certify that at the point of admission it is my clinical judgment that the patient will require inpatient hospital care  spanning beyond 2 midnights     Andris Baumann MD [facility]     01/10/2020, 11:11 PM

## 2020-01-10 NOTE — ED Notes (Signed)
Pt reports history of bilateral knee replacements and multiple abdominal surgeries. Pt c/o swelling and pain in legs bilaterally that started several weeks ago. Pt reports black stool yesterday and reports he was taken off blood thinner today by PCP. 2+ edema noted in lower extremities bilaterally.

## 2020-01-11 ENCOUNTER — Inpatient Hospital Stay: Payer: Medicare Other

## 2020-01-11 ENCOUNTER — Inpatient Hospital Stay (HOSPITAL_COMMUNITY)
Admit: 2020-01-11 | Discharge: 2020-01-11 | Disposition: A | Discharge status: Home / Self Care | Payer: Medicare Other | Attending: Internal Medicine | Admitting: Internal Medicine

## 2020-01-11 ENCOUNTER — Encounter: Payer: Self-pay | Admitting: Internal Medicine

## 2020-01-11 DIAGNOSIS — I5031 Acute diastolic (congestive) heart failure: Secondary | ICD-10-CM

## 2020-01-11 LAB — BASIC METABOLIC PANEL
Anion gap: 11 (ref 5–15)
BUN: 40 mg/dL — ABNORMAL HIGH (ref 8–23)
CO2: 20 mmol/L — ABNORMAL LOW (ref 22–32)
Calcium: 7.9 mg/dL — ABNORMAL LOW (ref 8.9–10.3)
Chloride: 108 mmol/L (ref 98–111)
Creatinine, Ser: 1.79 mg/dL — ABNORMAL HIGH (ref 0.61–1.24)
GFR calc Af Amer: 41 mL/min — ABNORMAL LOW (ref >60)
GFR calc non Af Amer: 36 mL/min — ABNORMAL LOW (ref >60)
Glucose, Bld: 184 mg/dL — ABNORMAL HIGH (ref 70–99)
Potassium: 4.2 mmol/L (ref 3.5–5.1)
Sodium: 139 mmol/L (ref 135–145)

## 2020-01-11 LAB — ECHOCARDIOGRAM COMPLETE
Height: 70 in
S' Lateral: 3.95 cm
Weight: 3840 oz

## 2020-01-11 LAB — SARS CORONAVIRUS 2 BY RT PCR (HOSPITAL ORDER, PERFORMED IN ~~LOC~~ HOSPITAL LAB): SARS Coronavirus 2: NEGATIVE

## 2020-01-11 LAB — GLUCOSE, CAPILLARY
Glucose-Capillary: 123 mg/dL — ABNORMAL HIGH (ref 70–99)
Glucose-Capillary: 145 mg/dL — ABNORMAL HIGH (ref 70–99)
Glucose-Capillary: 146 mg/dL — ABNORMAL HIGH (ref 70–99)
Glucose-Capillary: 161 mg/dL — ABNORMAL HIGH (ref 70–99)
Glucose-Capillary: 167 mg/dL — ABNORMAL HIGH (ref 70–99)
Glucose-Capillary: 170 mg/dL — ABNORMAL HIGH (ref 70–99)

## 2020-01-11 LAB — PREPARE RBC (CROSSMATCH)

## 2020-01-11 LAB — HEMOGLOBIN AND HEMATOCRIT, BLOOD
HCT: 22 % — ABNORMAL LOW (ref 39.0–52.0)
HCT: 22.6 % — ABNORMAL LOW (ref 39.0–52.0)
HCT: 22.1 % — ABNORMAL LOW (ref 39.0–52.0)
Hemoglobin: 7.2 g/dL — ABNORMAL LOW (ref 13.0–17.0)
Hemoglobin: 6.9 g/dL — ABNORMAL LOW (ref 13.0–17.0)
Hemoglobin: 6.4 g/dL — ABNORMAL LOW (ref 13.0–17.0)

## 2020-01-11 LAB — PROTIME-INR
INR: 1.7 — ABNORMAL HIGH (ref 0.8–1.2)
Prothrombin Time: 19 seconds — ABNORMAL HIGH (ref 11.4–15.2)

## 2020-01-11 LAB — CORTISOL-AM, BLOOD: Cortisol - AM: 23.7 ug/dL — ABNORMAL HIGH (ref 6.7–22.6)

## 2020-01-11 LAB — ABO/RH: ABO/RH(D): B POS

## 2020-01-11 LAB — PROCALCITONIN: Procalcitonin: 0.11 ng/mL

## 2020-01-11 LAB — HEMOGLOBIN A1C
Hgb A1c MFr Bld: 6.7 % — ABNORMAL HIGH (ref 4.8–5.6)
Mean Plasma Glucose: 145.59 mg/dL

## 2020-01-11 MED ORDER — ALBUTEROL SULFATE (2.5 MG/3ML) 0.083% IN NEBU: 3.0000 mL | INHALATION_SOLUTION | Freq: Four times a day (QID) | RESPIRATORY_TRACT | Status: DC | PRN

## 2020-01-11 MED ORDER — PIPERACILLIN-TAZOBACTAM 3.375 G IVPB
3.3750 g | Freq: Three times a day (TID) | INTRAVENOUS | Status: DC
  Administered 2020-01-11 – 2020-01-15 (×12): 3.375 g via INTRAVENOUS
  Filled 2020-01-11 (×13): qty 50

## 2020-01-11 MED ORDER — DILTIAZEM HCL 30 MG PO TABS
60.0000 mg | ORAL_TABLET | Freq: Three times a day (TID) | ORAL | Status: DC
  Administered 2020-01-11 – 2020-01-14 (×8): 60 mg via ORAL
  Filled 2020-01-11 (×8): qty 2

## 2020-01-11 MED ORDER — VANCOMYCIN HCL IN DEXTROSE 1-5 GM/200ML-% IV SOLN
1000.0000 mg | INTRAVENOUS | Status: DC
  Administered 2020-01-12 (×2): 1000 mg via INTRAVENOUS
  Filled 2020-01-11 (×3): qty 200

## 2020-01-11 MED ORDER — LIDOCAINE HCL (PF) 1 % IJ SOLN
INTRAMUSCULAR | Status: AC
  Filled 2020-01-11: qty 5

## 2020-01-11 MED ORDER — SODIUM CHLORIDE 0.9% IV SOLUTION: Freq: Once | INTRAVENOUS | Status: DC

## 2020-01-11 MED ORDER — VITAMIN B-12 1000 MCG PO TABS
1000.0000 ug | ORAL_TABLET | Freq: Every day | ORAL | Status: DC
  Administered 2020-01-12 – 2020-01-22 (×8): 1000 ug via ORAL
  Filled 2020-01-11 (×10): qty 1

## 2020-01-11 MED ORDER — ATORVASTATIN CALCIUM 20 MG PO TABS
40.0000 mg | ORAL_TABLET | Freq: Every day | ORAL | Status: DC
  Administered 2020-01-12 – 2020-01-22 (×9): 40 mg via ORAL
  Filled 2020-01-11 (×10): qty 2

## 2020-01-11 MED ORDER — FLUTICASONE PROPIONATE 50 MCG/ACT NA SUSP
2.0000 | Freq: Every day | NASAL | Status: DC | PRN
  Filled 2020-01-11: qty 16

## 2020-01-11 MED ORDER — AMIODARONE HCL 200 MG PO TABS
200.0000 mg | ORAL_TABLET | Freq: Every day | ORAL | Status: DC
  Administered 2020-01-12 – 2020-01-22 (×9): 200 mg via ORAL
  Filled 2020-01-11 (×10): qty 1

## 2020-01-11 MED ORDER — TRAMADOL HCL 50 MG PO TABS
50.0000 mg | ORAL_TABLET | Freq: Four times a day (QID) | ORAL | Status: DC | PRN
  Administered 2020-01-16: 50 mg via ORAL
  Filled 2020-01-11: qty 1

## 2020-01-11 NOTE — ED Notes (Signed)
6 inch ace wrap

## 2020-01-11 NOTE — Consult Note (Signed)
[name], MD [address], [place], [place], [address] [address], [address], [place], [place], [address] Phone: [phone]  Fax: [phone]  Consultation  Referring Provider:     Dr. Allena Katz Primary Care Physician:  Elspeth Cho., MD Reason for Consultation:    Melena Date of Admission:  01/10/2020 Date of Consultation:  01/11/2020         HPI:   Randall Vincent is a 79 y.o. male who had 1 episode of melanotic stool yesterday and none since then.  Patient has history of A. fib, cardioversion in June 2021, on Pradaxa.  Please note that patient's H&P states that patient is on Eliquis, however, patient states that this is not the case that he is not on Eliquis, but is on Pradaxa instead.  Last dose was yesterday.  Denies any previous history of GI bleed.  Reports a remote colonoscopy.  No recent colonoscopy, reports remote colonoscopy.  Medication list shows aspirin at home.  Past Medical History:  Diagnosis Date  . A-fib (HCC)   . Diabetes (HCC)   . Hyperlipidemia   . Hypertension     Past Surgical History:  Procedure Laterality Date  . BLADDER SURGERY    . BYPASS GRAFT    . CARDIOVERSION    . COLON SURGERY    . REPLACEMENT TOTAL KNEE BILATERAL    . STENT PLACEMENT VASCULAR ([facility] HX)      Prior to Admission medications   Medication Sig Start Date End Date Taking? Authorizing Provider  acetaminophen (TYLENOL) 500 MG tablet Take 500-1,000 mg by mouth every 6 (six) hours as needed for mild pain or fever.    [provider]  albuterol (VENTOLIN HFA) 108 (90 Base) MCG/ACT inhaler Inhale 2 puffs into the lungs every 6 (six) hours as needed for wheezing.    [provider]  aspirin 325 MG tablet Take 325 mg by mouth daily.    [provider]  atorvastatin (LIPITOR) 40 MG tablet Take 40 mg by mouth daily. 06/18/19   [provider]  dabigatran (PRADAXA) 150 MG CAPS capsule Take 150 mg by mouth 2 (two) times daily.     [provider]  diltiazem (CARDIZEM CD) 120 MG 24 hr capsule Take 1 capsule (120 mg total) by mouth daily. 06/28/19   Zannie Cove, MD  fluticasone [facility]) 50 MCG/ACT nasal spray Place 2 sprays into both nostrils daily as needed for allergies or rhinitis.    [provider]  metFORMIN (GLUCOPHAGE-XR) 500 MG 24 hr tablet Take 1,000 mg by mouth daily. 05/20/19   [provider]  ramipril (ALTACE) 10 MG capsule Take 20 mg by mouth daily. 06/18/19   [provider]  Semaglutide, 1 MG/DOSE, 2 MG/1.5ML SOPN Inject 1 mg into the skin every Thursday.    [provider]  sildenafil (VIAGRA) 100 MG tablet Take 100 mg by mouth daily as needed for erectile dysfunction. 06/24/19   [provider]  spironolactone (ALDACTONE) 25 MG tablet Take 25 mg by mouth daily. 06/18/19   [provider]  traMADol (ULTRAM) 50 MG tablet Take 50 mg by mouth every 6 (six) hours as needed for moderate pain.    [provider]  vitamin B-12 (CYANOCOBALAMIN) 1000 MCG tablet Take 1,000 mcg by mouth daily.    [provider]    History reviewed. No pertinent family history.   Social History   Tobacco Use  . 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 Smokeless tobacco: Never Used  Substance Use Topics  . Alcohol use: Not on file  . Drug use: Not on file    Allergies as of 01/10/2020  . (No Known Allergies)    Review of Systems:    All systems reviewed and negative except where noted in HPI.   Physical Exam:  Vital signs in last 24 hours: Vitals:   01/11/20 0830 01/11/20 0900 01/11/20 0930 01/11/20 1000  BP: (!) 135/55 (!) 139/54 (!) 131/44 (!) 134/48  Pulse: 74 76 76 74  Resp: 18 (!) 22 (!) 24   Temp:      TempSrc:      SpO2: 94% 100% 97% 96%  Weight:      Height:         General:   Pleasant, cooperative in NAD Head:  Normocephalic and atraumatic. Eyes:   No icterus.   Conjunctiva pink.  PERRLA. Ears:  Normal auditory acuity. Neck:  Supple; no masses or thyroidomegaly Lungs: Respirations even and unlabored. Lungs clear to auscultation bilaterally.   No wheezes, crackles, or rhonchi.  Abdomen:  Soft, nondistended, nontender. Normal bowel sounds. No appreciable masses or hepatomegaly.  No rebound or guarding.  Neurologic:  Alert and oriented x3;  grossly normal neurologically. Skin:  Intact without significant lesions or rashes. Cervical Nodes:  No significant cervical adenopathy. Psych:  Alert and cooperative. Normal affect.  LAB RESULTS: Recent Labs    01/10/20 1837 01/11/20 0859  WBC 18.1*  --   HGB 8.0* 7.2*  HCT 25.7* 22.0*  PLT 426*  --    BMET Recent Labs    01/10/20 1837 01/11/20 0324  NA 136 139  K 4.5 4.2  CL 104 108  CO2 21* 20*  GLUCOSE 199* 184*  BUN 43* 40*  CREATININE 1.77* 1.79*  CALCIUM 8.5* 7.9*   LFT No results for input(s): PROT, ALBUMIN, AST, ALT, ALKPHOS, BILITOT, BILIDIR, IBILI in the last 72 hours. PT/INR Recent Labs    01/10/20 1837 01/11/20 0324  LABPROT 20.9* 19.0*  INR 1.9* 1.7*    STUDIES: DG Chest 2 View  Result Date: 01/10/2020 CLINICAL DATA:  Weakness EXAM: CHEST - 2 VIEW COMPARISON:  06/26/2019 FINDINGS: Hazy interstitial opacities with a mid to lower lung predominance with some slight vascular cephalization/congestion. No pneumothorax. No effusion. No focal consolidation. Cardiomegaly appearance is similar to priors with evidence of prior sternotomy and CABG and a calcified, tortuous aorta. Cardiomediastinal contours are otherwise unremarkable. No acute osseous or soft tissue abnormality. Degenerative changes are present in the imaged spine and shoulders. IMPRESSION: Features of CHF/volume overload with cardiomegaly and findings suggesting mild interstitial pulmonary edema. Electronically Signed   By: Kreg Shropshire M.D.   On: 01/10/2020 19:19   DG Knee 1-2 Views Left  Result Date: 01/11/2020 CLINICAL DATA:  Bilateral  knee replacements.  Pain and swelling. EXAM: LEFT KNEE - 1-2 VIEW COMPARISON:  None. FINDINGS: Left total knee arthroplasty without perihardware lucency. Intermediate sized suprapatellar joint effusion. No evidence of arthropathy or other focal bone abnormality. Soft tissues are unremarkable. IMPRESSION: 1. Intermediate sized suprapatellar joint effusion. 2. Uncomplicated left total knee arthroplasty. Electronically Signed   By: Deatra Robinson M.D.   On: 01/11/2020 04:34   DG Knee 1-2 Views Right  Result Date: 01/11/2020 CLINICAL DATA:  Knee replacements.  Swelling and pain. EXAM: RIGHT KNEE - 1-2 VIEW COMPARISON:  None. FINDINGS: There is a right total knee arthroplasty. No perihardware  lucency. No evidence of arthropathy or other focal bone abnormality. Soft tissues are unremarkable. 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 Electronically Signed   By: Deatra Robinson M.D.   On: 01/11/2020 04:35      Impression / Plan:   Randall Vincent is a 79 y.o. y/o male with Pradaxa use for A. fib, aspirin use at home, presents with 1 episode of melanotic stool, currently hemodynamically stable  Possible NSAID induced ulcer causing upper GI bleed  Pradaxa will need to be held for endoscopic procedures since endoscopic procedure with hemostasis considered a high risk procedure and as per guidelines, anticoagulation should be held.  Based on low creatinine clearance, hold medication for 2 days prior to endoscopy  Proceed with endoscopy on 01/13/2020.  However, if patient becomes hemodynamically unstable or has active GI bleeding, and needs procedure prior to the above date, this may need to be considered and patient will need to be reevaluated for the same  PPI IV twice daily  Continue serial CBCs and transfuse PRN Avoid NSAIDs Maintain 2 large-bore IV lines Please page GI with any acute hemodynamic changes, or signs of active GI bleeding   Thank you for involving me in the care of  this patient.      LOS: 1 day   Pasty Spillers, MD  01/11/2020, 1:40 PM

## 2020-01-11 NOTE — Progress Notes (Signed)
PROGRESS NOTE    Randall Vincent  WNU:[id number] DOB: 09/28/1940 DOA: 01/10/2020 PCP: Elspeth Cho., MD    Brief Narrative: Randall Vincent is a 79 y.o. male who complains of left knee pain and swelling for the last few days.  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  He has never had problems with the left knee until recently.  He was seen in our office at [facility] earlier this week complaining of pain and swelling.  He was scheduled for a bone scan to evaluate him for prosthetic loosening versus infection by Dr. Odis Luster.  However he developed GI bleeding in the last day or so and has presented to the emergency room for this reason primarily.  Hemoglobin is down to 7.2 from a high of 13.5 earlier this year.  White blood count is 18,100.  Temperature is about 100 F.  The left knee pain and swelling was noted as well as well as a slightly elevated white count and mild fever.  He had cardioversion in June and July and has remained in sinus rhythm he says.  He was switched from Eliquis to Pradaxa at that time and took his last Pradaxa dose yesterday.  He has not had any recent infections that he knows of.  He denies feeling any fever but has had a chill.  His medical history is complicated by cardiac problems with a history of coronary bypass graft: Surgery cardiac stent placement diabetes hyperlipidemia and hypertension.  Medications included albuterol aspirin Lipitor Pradaxa 150 mg twice daily Flonase Metformin Altase semaglutide Aldactone Ultram as needed and vitamin B-12.   Assessment & Plan:   Principal Problem:   Melena Active Problems:   CAD, multiple vessel   S/P CABG x 2   Atrial fibrillation status post cardioversion 10/22/19 [facility])   Essential hypertension   DM (diabetes mellitus), type 2 (HCC)   Symptomatic anemia   Acute blood loss anemia    Suspect Septic arthritis (HCC)   Chronic anticoagulation   History of bilateral knee replacement   Chronic kidney disease, stage 3a   Acute on chronic congestive heart failure (HCC)   Elevated troponin    Acute blood loss anemia secondary to upper GI bleed/melena in the setting of chronic anticoagulation with Eliquis -Stop pradaxa. -IV Protonix bolus and infusion -Serial H&H and transfuse if necessary -Monitor closely.  Patient currently hemodynamically stable -GI consult -N.p.o. after midnight for possible endoscopy in the a.m.  Suspect Septic arthritis (HCC)    History of bilateral knee replacement -Patient with history of knee replacement several years ago presenting with a several week history of worsening pain and swelling of the right knee -Bone scan was ordered from the outpatient uncertain if performed -We will get baseline diagnostic imaging with x-ray -Orthopedic consult to direct further imaging studies to evaluate for septic joint and to arrange for arthrocentesis -Empiric vancomycin and Zosyn.  Mild acute on chronic congestive heart failure, unspecified (HCC)   Symptomatic anemia -Patient with mild shortness of breath above baseline, but pulmonary vascular congestion on chest x-ray, BNP elevated at 400 -Suspect triggered by acute blood loss anemia related to GI bleed above -Echocardiogram in the a.m. no prior echoes found on chart review -Holding beta-blocker and ACE inhibitor in the setting of acute GI bleed and concern for hypotension -Low-dose Lasix only for overtly symptomatic dyspnea  Elevated troponin   CAD, multiple vessel   S/P CABG x 2 -Troponin 37>>> 42, suspect secondary to demand ischemia from above -Continue to monitor -Holding aspirin and metoprolol for now based on above acute issues -Might benefit from cardiology consult during hospitalization. -Mildly elevated troponins to renal failure.  Continue to monitor for chest pain or clinical  symptoms. -Home regimen statin continued, antiplatelet therapy with aspirin held, patient's home regimen with diltiazem held as I suspect patient's blood pressure may drop further. Patient's home regimen with Aldactone is also held, diltiazem was 240 at home we have changed it to every 8 hour regimen with parameters.     Atrial fibrillation status post cardioversion 10/22/19 [facility]) -Patient currently in sinus rhythm -Recently switched from Pradaxa to Eliquis on 12/18/2019 due to concern for renal function -Eliquis discontinued for now due to GI bleeding.    Essential hypertension -Antihypertensives as BP with tolerate. -home meds held due renal failure and possible hypotension. -diltiazem changed to po q8 with parameters.     DM (diabetes mellitus), type 2 (HCC) -Fingersticks every 4 while n.p.o. with sliding scale coverage. -Hypoglycemia protocol. -A1c is 6.7 and under good control. -Regimen is with sliding scale coverage only Metformin is discontinued due to recent renal insufficiency.  Acute kidney injury: -GFR at baseline -As mentioned above anticoagulant was switched from Pradaxa to Eliquis for preservation of renal function. -Avoid any nephrotoxic agents and contrast, renally dose meds as needed.   DVT prophylaxis: SCDs Code Status: full code  Family Communication:  none  Disposition Plan: Back to previous home environment Consults called: GI, orthopedics  Subjective: Pt is alert oriented and here for GIB and suspected Left knee infection. Orthopedic and GI. Pt is stable clinically and denies any chest pain or sob.  Objective: Vitals:   01/11/20 0900 01/11/20 0930 01/11/20 1000 01/11/20 1400  BP: (!) 139/54 (!) 131/44 (!) 134/48 (!) 144/55  Pulse: 76 76 74 80  Resp: (!) 22 (!) 24  18  Temp:      TempSrc:      SpO2: 100% 97% 96% 97%  Weight:      Height:        Intake/Output Summary (Last 24 hours) at 01/11/2020 1743 Last data filed at 01/11/2020 1307 Gross per  24 hour  Intake 50 ml  Output 350 ml  Net -300 ml   Filed Weights   01/10/20 1826  Weight: 108.9 kg    Examination: Blood pressure (!) 144/55, pulse 80, temperature 98.8 F (37.1 C), temperature source Oral, resp. rate 18, height  (1.778 m), weight 108.9 kg, SpO2 97 %. General exam: Appears calm and comfortable  Respiratory system: Clear to auscultation. Respiratory effort normal. Cardiovascular system: S1 & S2 heard, RRR. No JVD, murmurs, rubs, gallops or clicks. No pedal edema. Gastrointestinal system: Abdomen is nondistended, soft and nontender. No organomegaly or masses felt. Normal bowel sounds heard. Central nervous system: Alert and oriented. No focal neurological deficits. Extremities: Symmetric 5 x 5 power. Skin: No rashes, lesions or ulcers Psychiatry: Judgement and insight appear normal. Mood & affect appropriate.   Data Reviewed: I have personally reviewed following labs and imaging studies  CBC: Recent Labs  Lab 01/10/20 1837 01/11/20 0859  WBC 18.1*  --   NEUTROABS 15.7*  --   HGB 8.0* 7.2*  HCT 25.7* 22.0*  MCV 84.0  --   PLT 426*  --    Basic Metabolic Panel: Recent Labs  Lab 01/10/20 1837 01/11/20 0324  NA  136 139  K 4.5 4.2  CL 104 108  CO2 21* 20*  GLUCOSE 199* 184*  BUN 43* 40*  CREATININE 1.77* 1.79*  CALCIUM 8.5* 7.9*   GFR: Estimated Creatinine Clearance: 42 mL/min (A) (by C-G formula based on SCr of 1.79 mg/dL (H)).  Coagulation Profile: Recent Labs  Lab 01/10/20 1837 01/11/20 0324  INR 1.9* 1.7*   HbA1C: Recent Labs    01/10/20 0037  HGBA1C 6.7*   CBG: Recent Labs  Lab 01/11/20 0019 01/11/20 0412 01/11/20 0852 01/11/20 1347 01/11/20 1738  GLUCAP 167* 170* 146* 145* 161*   Sepsis Labs: Recent Labs  Lab 01/10/20 1837 01/10/20 2028 01/11/20 0324  PROCALCITON  --   --  0.11  LATICACIDVEN 2.0* 1.3  --     Recent Results (from the past 240 hour(s))  Culture, blood (routine x 2)     Status: None  (Preliminary result)   Collection Time: 01/11/20 12:37 AM   Specimen: BLOOD  Result Value Ref Range Status   Specimen Description BLOOD RIGHT ANTECUBITAL  Final   Special Requests   Final    BOTTLES DRAWN AEROBIC AND ANAEROBIC Blood Culture results may not be optimal due to an excessive volume of blood received in culture bottles   Culture   Final    NO GROWTH < 12 HOURS Performed at [facility], [address] [place], [place] [address]    Report Status PENDING  Incomplete  Culture, blood (routine x 2)     Status: None (Preliminary result)   Collection Time: 01/11/20 12:37 AM   Specimen: BLOOD  Result Value Ref Range Status   Specimen Description BLOOD LEFT ANTECUBITAL  Final   Special Requests   Final    BOTTLES DRAWN AEROBIC AND ANAEROBIC Blood Culture results may not be optimal due to an excessive volume of blood received in culture bottles   Culture   Final    NO GROWTH < 12 HOURS Performed at [facility], [address] [place], [place] [address]    Report Status PENDING  Incomplete  SARS Coronavirus 2 by RT PCR (hospital order, performed in [facility] hospital lab) Nasopharyngeal Nasopharyngeal Swab     Status: None   Collection Time: 01/11/20  6:14 AM   Specimen: Nasopharyngeal Swab  Result Value Ref Range Status   SARS Coronavirus 2 NEGATIVE NEGATIVE Final    Comment: (NOTE) SARS-CoV-2 target nucleic acids are NOT DETECTED.  The SARS-CoV-2 RNA is generally detectable in upper and lower respiratory specimens during the acute phase of infection. The lowest concentration of SARS-CoV-2 viral copies this assay can detect is 250 copies / mL. A negative result does not preclude SARS-CoV-2 infection and should not be used as the sole basis for treatment or other patient management decisions.  A negative result may occur with improper specimen collection / handling, submission of specimen other than nasopharyngeal swab, presence of viral mutation(s)  within the areas targeted by this assay, and inadequate number of viral copies (<250 copies / mL). A negative result must be combined with clinical observations, patient history, and epidemiological information.  Fact Sheet for Patients:   BoilerBrush.com.cy  Fact Sheet for Healthcare Providers: https://pope.com/  This test is not yet approved or  cleared by the [place] FDA and has been authorized for detection and/or diagnosis of SARS-CoV-2 by FDA under an Emergency Use Authorization (EUA).  This EUA will remain in effect (meaning this test can be used) for the duration of the COVID-19  declaration under Section 564(b)(1) of the Act, 21 U.S.C. section 360bbb-3(b)(1), unless the authorization is terminated or revoked sooner.  Performed at [facility], [address] [place], [place] [address]      Radiology Studies: DG Chest 2 View  Result Date: 01/10/2020 CLINICAL DATA:  Weakness EXAM: CHEST - 2 VIEW COMPARISON:  06/26/2019 FINDINGS: Hazy interstitial opacities with a mid to lower lung predominance with some slight vascular cephalization/congestion. No pneumothorax. No effusion. No focal consolidation. Cardiomegaly appearance is similar to priors with evidence of prior sternotomy and CABG and a calcified, tortuous aorta. Cardiomediastinal contours are otherwise unremarkable. No acute osseous or soft tissue abnormality. Degenerative changes are present in the imaged spine and shoulders.  IMPRESSION: Features of CHF/volume overload with cardiomegaly and findings suggesting mild interstitial pulmonary edema. Electronically Signed   By: Kreg Shropshire M.D.   On: 01/10/2020 19:19   DG Knee 1-2 Views Left Result Date: 01/11/2020 CLINICAL DATA:  Bilateral knee replacements.  Pain and swelling. EXAM: LEFT KNEE - 1-2 VIEW COMPARISON:  None. FINDINGS: Left total knee arthroplasty without perihardware lucency. Intermediate sized  suprapatellar joint effusion. No evidence of arthropathy or other focal bone abnormality. Soft tissues are unremarkable. IMPRESSION:  1. Intermediate sized suprapatellar joint effusion. 2. Uncomplicated left total knee arthroplasty.  Electronically Signed   By: Deatra Robinson M.D.   On: 01/11/2020 04:34   DG Knee 1-2 Views Right  Result Date: 01/11/2020 CLINICAL DATA:  Knee replacements.  Swelling and pain. EXAM: RIGHT KNEE - 1-2 VIEW COMPARISON:  None. FINDINGS: There is a right total knee arthroplasty. No perihardware lucency. No evidence of arthropathy or other focal bone abnormality. Soft tissues are unremarkable. 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 Electronically Signed   By: Deatra Robinson M.D.   On: 01/11/2020 04:35   ECHOCARDIOGRAM COMPLETE  Result Date: 01/11/2020    ECHOCARDIOGRAM REPORT   Patient Name:   Randall Vincent Date of Exam: 01/11/2020 Medical Rec #:  [id number]     Height:       70.0 in Accession #:    [id number]    Weight:       240.0 lb Date of Birth:  11-Jul-1940      BSA:          2.255 m Patient Age:    79 years      BP:           134/48 mmHg Patient Gender: M             HR:           74 bpm. Exam Location:  [facility] Procedure: 2D Echo, Cardiac Doppler and Color Doppler Indications:     CHF- acute diastolic 428.31  History:         Patient has no prior history of Echocardiogram examinations.                  Arrythmias:Atrial Fibrillation; Risk Factors:Hypertension,                  Diabetes and Dyslipidemia.  Sonographer:     Cristela Blue RDCS (AE) Referring Phys:  [id number] Andris Baumann Diagnosing Phys: Lorine Bears MD  Sonographer Comments: No apical window.  IMPRESSIONS   1. Left ventricular ejection fraction, by estimation, is 50 to 55%. The left ventricle has low normal function. Left ventricular endocardial border not optimally defined to evaluate regional wall motion. Left ventricular diastolic function could not be evaluated.   2.  Right ventricular  systolic function is normal. The right ventricular size is normal. Tricuspid regurgitation signal is inadequate for assessing PA pressure.   3. Left atrial size was mildly dilated.   4. The mitral valve is normal in structure. No evidence of mitral valve regurgitation. No evidence of mitral stenosis.   5. The aortic valve is normal in structure. Aortic valve regurgitation is not visualized. Mild aortic valve sclerosis is present, with no evidence of aortic valve stenosis.   6. Mildly dilated pulmonary artery.   7. The inferior vena cava is normal in size with greater than 50% respiratory variability, suggesting right atrial pressure of 3 mmHg.  FINDINGS  Left Ventricle: Left ventricular ejection fraction, by estimation, is 50 to 55%. The left ventricle has low normal function. Left ventricular endocardial border not optimally defined to evaluate regional wall motion. The left ventricular internal cavity  size was normal in size. There is borderline left ventricular hypertrophy. Left ventricular diastolic function could not be evaluated. Right Ventricle: The right ventricular size is normal. No increase in right ventricular wall thickness. Right ventricular systolic function is normal. Tricuspid regurgitation signal is inadequate for assessing PA pressure. The tricuspid regurgitant velocity is 2.67 m/s, and with an assumed right atrial pressure of 5 mmHg, the estimated right ventricular systolic pressure is 33.5 mmHg. Left Atrium: Left atrial size was mildly dilated. Right Atrium: Right atrial size was normal in size. Pericardium: There is no evidence of pericardial effusion. Mitral Valve: The mitral valve is normal in structure. Normal mobility of the mitral valve leaflets. Mild mitral annular calcification. No evidence of mitral valve regurgitation. No evidence of mitral valve stenosis. Tricuspid Valve: The tricuspid valve is normal in structure. Tricuspid valve regurgitation is trivial. No evidence of  tricuspid stenosis. Aortic Valve: The aortic valve is normal in structure. Aortic valve regurgitation is not visualized. Mild aortic valve sclerosis is present, with no evidence of aortic valve stenosis. Pulmonic Valve: The pulmonic valve was normal in structure. Pulmonic valve regurgitation is trivial. No evidence of pulmonic stenosis. Aorta: The aortic root is normal in size and structure. Pulmonary Artery: The pulmonary artery is mildly dilated. Venous: The inferior vena cava is normal in size with greater than 50% respiratory variability, suggesting right atrial pressure of 3 mmHg. IAS/Shunts: No atrial level shunt detected by color flow Doppler.  LEFT VENTRICLE PLAX 2D LVIDd:         5.30 cm LVIDs:         3.95 cm LV PW:         0.68 cm LV IVS:        1.77 cm LVOT diam:     2.00 cm LVOT Area:     3.14 cm  LEFT ATRIUM         Index LA diam:    4.70 cm 2.08 cm/m                        PULMONIC VALVE AORTA                 PV Vmax:        0.73 m/s Ao Root diam: 3.20 cm PV Peak grad:   2.1 mmHg                       RVOT Peak grad: 7 mmHg  TRICUSPID VALVE TR Peak grad:   28.5 mmHg TR Vmax:        267.00  cm/s  SHUNTS Systemic Diam: 2.00 cm Lorine BearsMuhammad Arida MD Electronically signed by Lorine BearsMuhammad Arida MD Signature Date/Time: 01/11/2020/5:11:53 PM    Final     Scheduled Meds: . insulin aspart  0-15 Units Subcutaneous Q4H  . lidocaine (PF)      . [START ON 01/14/2020] pantoprazole  40 mg Intravenous Q12H   Continuous Infusions: . sodium chloride 75 mL/hr at 01/11/20 0103  . pantoprozole (PROTONIX) infusion 8 mg/hr (01/11/20 1655)  . piperacillin-tazobactam (ZOSYN)  IV Stopped (01/11/20 1307)  . vancomycin       LOS: 1 day   Domingo Pulsekta V Xzaria Teo,MD [facility] Pager [phone] If 7PM-7AM, please contact night-coverage [email or web address] Password [email or web address] 01/11/2020, 5:43 PM

## 2020-01-11 NOTE — Consult Note (Signed)
ORTHOPAEDIC CONSULTATION  REQUESTING PHYSICIAN: Gertha Calkin, MD  Chief Complaint: Left knee pain and swelling  HPI: Randall Vincent is a 79 y.o. male who complains of left knee pain and swelling for the last few days.  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  He has never had problems with the left knee until recently.  He was seen in our office at [facility] earlier this week complaining of pain and swelling.  He was scheduled for a bone scan to evaluate him for prosthetic loosening versus infection by Dr. Odis Luster.  However he developed GI bleeding in the last day or so and has presented to the emergency room for this reason primarily.  Hemoglobin is down to 7.2 from a high of 13.5 earlier this year.  White blood count is 18,100.  Temperature is about 100 F.  The left knee pain and swelling was noted as well as well as a slightly elevated white count and mild fever.  He had cardioversion in June and July and has remained in sinus rhythm he says.  He was switched from Eliquis to Pradaxa at that time and took his last Pradaxa dose yesterday.  He has not had any recent infections that he knows of.  He denies feeling any fever but has had a chill.  His medical history is complicated by cardiac problems with a history of coronary bypass graft: Surgery cardiac stent placement diabetes hyperlipidemia and hypertension.  Medications included albuterol aspirin Lipitor Pradaxa 150 mg twice daily Flonase Metformin Altase semaglutide Aldactone Ultram as needed and vitamin B-12.  Past Medical History:  Diagnosis Date  . A-fib (HCC)   . Diabetes (HCC)   . Hyperlipidemia   . Hypertension    Past Surgical History:  Procedure Laterality Date  . BLADDER SURGERY    . BYPASS GRAFT    . CARDIOVERSION    . COLON SURGERY    . REPLACEMENT TOTAL KNEE BILATERAL    . STENT  PLACEMENT VASCULAR ([facility])     Social History   Socioeconomic History  . Marital status: Widowed    Spouse name: Not on file  . Number of children: Not on file  . Years of education: Not on file  . Highest education level: Not on file  Occupational History  . Not on file  Tobacco Use  . Smoking status: Former Smoker    Quit date: 1991    Years since quitting: 30.6  . Smokeless tobacco: Never Used  Substance and Sexual Activity  . Alcohol use: Not on file  . Drug use: Not on file  . Sexual activity: Not on file  Other Topics Concern  . Not on file  Social History Narrative  . Not on file   Social Determinants of Health   Financial Resource Strain:   . Difficulty of Paying Living Expenses: Not on file  Food Insecurity:   . Worried About Programme researcher, broadcasting/film/video in the Last Year: Not on file  . Ran Out of Food in the Last Year: Not on file  Transportation Needs:   . Lack of Transportation (Medical): Not on file  . Lack of Transportation (Non-Medical): Not on file  Physical Activity:   . Days of Exercise per Week: Not on file  . Minutes of Exercise per Session: Not on file  Stress:   . Feeling  of Stress : Not on file  Social Connections:   . Frequency of Communication with Friends and Family: Not on file  . Frequency of Social Gatherings with Friends and Family: Not on file  . Attends Religious Services: Not on file  . Active Member of Clubs or Organizations: Not on file  . Attends Banker Meetings: Not on file  . Marital Status: Not on file   History reviewed. No pertinent family history. No Known Allergies Prior to Admission medications   Medication Sig Start Date End Date Taking? Authorizing Provider  acetaminophen (TYLENOL) 500 MG tablet Take 500-1,000 mg by mouth every 6 (six) hours as needed for mild pain or fever.    [provider]  albuterol (VENTOLIN HFA) 108 (90 Base) MCG/ACT inhaler Inhale 2 puffs into the lungs every 6 (six) hours as  needed for wheezing.    [provider]  aspirin 325 MG tablet Take 325 mg by mouth daily.    [provider]  atorvastatin (LIPITOR) 40 MG tablet Take 40 mg by mouth daily. 06/18/19   [provider]  dabigatran (PRADAXA) 150 MG CAPS capsule Take 150 mg by mouth 2 (two) times daily.    [provider]  diltiazem (CARDIZEM CD) 120 MG 24 hr capsule Take 1 capsule (120 mg total) by mouth daily. 06/28/19   Zannie Cove, MD  fluticasone [facility]) 50 MCG/ACT nasal spray Place 2 sprays into both nostrils daily as needed for allergies or rhinitis.    [provider]  metFORMIN (GLUCOPHAGE-XR) 500 MG 24 hr tablet Take 1,000 mg by mouth daily. 05/20/19   [provider]  ramipril (ALTACE) 10 MG capsule Take 20 mg by mouth daily. 06/18/19   [provider]  Semaglutide, 1 MG/DOSE, 2 MG/1.5ML SOPN Inject 1 mg into the skin every Thursday.    [provider]  sildenafil (VIAGRA) 100 MG tablet Take 100 mg by mouth daily as needed for erectile dysfunction. 06/24/19   [provider]  spironolactone (ALDACTONE) 25 MG tablet Take 25 mg by mouth daily. 06/18/19   [provider]  traMADol (ULTRAM) 50 MG tablet Take 50 mg by mouth every 6 (six) hours as needed for moderate pain.    [provider]  vitamin B-12 (CYANOCOBALAMIN) 1000 MCG tablet Take 1,000 mcg by mouth daily.    [provider]   DG Chest 2 View  Result Date: 01/10/2020 CLINICAL DATA:  Weakness EXAM: CHEST - 2 VIEW COMPARISON:  06/26/2019 FINDINGS: Hazy interstitial opacities with a mid to lower lung predominance with some slight vascular cephalization/congestion. No pneumothorax. No effusion. No focal consolidation. Cardiomegaly appearance is similar to priors with evidence of prior sternotomy and CABG and a calcified, tortuous aorta. Cardiomediastinal contours are otherwise unremarkable. No acute osseous or soft tissue abnormality. Degenerative  changes are present in the imaged spine and shoulders. IMPRESSION: Features of CHF/volume overload with cardiomegaly and findings suggesting mild interstitial pulmonary edema. Electronically Signed   By: Kreg Shropshire M.D.   On: 01/10/2020 19:19   DG Knee 1-2 Views Left  Result Date: 01/11/2020 CLINICAL DATA:  Bilateral knee replacements.  Pain and swelling. EXAM: LEFT KNEE - 1-2 VIEW COMPARISON:  None. FINDINGS: Left total knee arthroplasty without perihardware lucency. Intermediate sized suprapatellar joint effusion. No evidence of arthropathy or other focal bone abnormality. Soft tissues are unremarkable. IMPRESSION: 1. Intermediate sized suprapatellar joint effusion. 2. Uncomplicated left total knee arthroplasty. Electronically Signed   By: Chrisandra Netters.D.  On: 01/11/2020 04:34   DG Knee 1-2 Views Right  Result Date: 01/11/2020 CLINICAL DATA:  Knee replacements.  Swelling and pain. EXAM: RIGHT KNEE - 1-2 VIEW COMPARISON:  None. FINDINGS: There is a right total knee arthroplasty. No perihardware lucency. No evidence of arthropathy or other focal bone abnormality. Soft tissues are unremarkable. 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 Electronically Signed   By: Deatra Robinson M.D.   On: 01/11/2020 04:35    Positive ROS: All other systems have been reviewed and were otherwise negative with the exception of those mentioned in the HPI and as above.  Physical Exam: General: Alert, no acute distress Cardiovascular: No pedal edema Respiratory: No cyanosis, no use of accessory musculature GI: No organomegaly, abdomen is soft and non-tender Skin: No lesions in the area of chief complaint Neurologic: Sensation intact distally Psychiatric: Patient is competent for consent with normal mood and affect Lymphatic: No axillary or cervical lymphadenopathy  MUSCULOSKELETAL: Patient is alert and awake and lying comfortably on the emergency room bed.  He is overweight.  He is very  pleasant and cooperative.  He has well-healed incisions on both total knees.  The right knee is large but there is no soft tissue swelling or effusion in the knee.  Range of motion is painless.  There is no redness or warmth on the right.  He has stasis dermatitis changes in the right lower leg.  There is no real edema on the right although he says there was some the other day.  On the left there is no redness but there is an minimal warmth increased warmth.  There is a 2-3+ effusion however.  There is some pain with range of motion.  Skin is intact otherwise.  There is stasis dermatitis distally here as well.  There is no redness or cellulitis anywhere.  Neurovascular is intact distally in both legs.  X-rays of both knees show satisfactory position alignment.  There is no sign of loosening nor any erosions consistent with chronic infection.  There is an effusion in the left knee.  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  This was sent to the laboratory for analysis of cell count and differential, a aerobic and anaerobic cultures.  We use ice and compression on the knee for now.  I do not feel that immediate surgery is indicated due to the patient's recent uses of Pradaxa, current GI bleed, and lack of significant systemic changes.  I have contacted Dr. Odis Luster and for further consultation on plans for this.  Gastroenterology service has seen the patient and plans to do an endoscopy on Monday due to the use of Pradaxa.  Continue Zosyn and vancomycin IV antibiotics to suppress infection for now.    Valinda Hoar, MD [phone]   01/11/2020 2:01 PM

## 2020-01-11 NOTE — ED Notes (Signed)
X-ray at bedside

## 2020-01-11 NOTE — Progress Notes (Signed)
Pharmacy Antibiotic Note  Randall Vincent is a 79 y.o. male admitted on 01/10/2020 with cellulitis.  Pharmacy has been consulted for Zosyn and Vancomycin dosing.  Plan: Zosyn 3.375g IV q8h (4 hour infusion).   Vancomycin 1000 mg IV Q 24 hrs. Goal AUC 400-550. Expected AUC: 542.1, Css min 15.1 SCr used: 1.77  Height: 5\' 10"  (177.8 cm) Weight: 108.9 kg (240 lb) IBW/kg (Calculated) : 73  Temp (24hrs), Avg:99 F (37.2 C), Min:98.8 F (37.1 C), Max:99.2 F (37.3 C)  Recent Labs  Lab 01/10/20 1837 01/10/20 2028  WBC 18.1*  --   CREATININE 1.77*  --   LATICACIDVEN 2.0* 1.3    Estimated Creatinine Clearance: 42.5 mL/min (A) (by C-G formula based on SCr of 1.77 mg/dL (H)).    No Known Allergies  Antimicrobials this admission:   >>    >>   Dose adjustments this admission:   Microbiology results:  BCx:   UCx:    Sputum:    MRSA PCR:   Thank you for allowing pharmacy to be a part of this patient's care.  2029 A 01/11/2020 4:09 AM

## 2020-01-11 NOTE — Progress Notes (Signed)
*  PRELIMINARY RESULTS* Echocardiogram 2D Echocardiogram has been performed.  Cristela Blue 01/11/2020, 3:01 PM

## 2020-01-12 LAB — HEMOGLOBIN AND HEMATOCRIT, BLOOD
HCT: 23.4 % — ABNORMAL LOW (ref 39.0–52.0)
HCT: 27.1 % — ABNORMAL LOW (ref 39.0–52.0)
HCT: 25.7 % — ABNORMAL LOW (ref 39.0–52.0)
Hemoglobin: 7.2 g/dL — ABNORMAL LOW (ref 13.0–17.0)
Hemoglobin: 8.9 g/dL — ABNORMAL LOW (ref 13.0–17.0)
Hemoglobin: 8.3 g/dL — ABNORMAL LOW (ref 13.0–17.0)

## 2020-01-12 LAB — GLUCOSE, CAPILLARY
Glucose-Capillary: 149 mg/dL — ABNORMAL HIGH (ref 70–99)
Glucose-Capillary: 130 mg/dL — ABNORMAL HIGH (ref 70–99)
Glucose-Capillary: 161 mg/dL — ABNORMAL HIGH (ref 70–99)
Glucose-Capillary: 132 mg/dL — ABNORMAL HIGH (ref 70–99)
Glucose-Capillary: 138 mg/dL — ABNORMAL HIGH (ref 70–99)

## 2020-01-12 LAB — CBC
HCT: 27.4 % — ABNORMAL LOW (ref 39.0–52.0)
Hemoglobin: 8.6 g/dL — ABNORMAL LOW (ref 13.0–17.0)
MCH: 26.9 pg (ref 26.0–34.0)
MCHC: 31.4 g/dL (ref 30.0–36.0)
MCV: 85.6 fL (ref 80.0–100.0)
Platelets: 352 10*3/uL (ref 150–400)
RBC: 3.2 MIL/uL — ABNORMAL LOW (ref 4.22–5.81)
RDW: 16.6 % — ABNORMAL HIGH (ref 11.5–15.5)
WBC: 13.6 10*3/uL — ABNORMAL HIGH (ref 4.0–10.5)
nRBC: 0.2 % (ref 0.0–0.2)

## 2020-01-12 LAB — GRAM STAIN

## 2020-01-12 LAB — BASIC METABOLIC PANEL
Anion gap: 8 (ref 5–15)
BUN: 31 mg/dL — ABNORMAL HIGH (ref 8–23)
CO2: 21 mmol/L — ABNORMAL LOW (ref 22–32)
Calcium: 8.3 mg/dL — ABNORMAL LOW (ref 8.9–10.3)
Chloride: 106 mmol/L (ref 98–111)
Creatinine, Ser: 1.71 mg/dL — ABNORMAL HIGH (ref 0.61–1.24)
GFR calc Af Amer: 43 mL/min — ABNORMAL LOW (ref >60)
GFR calc non Af Amer: 38 mL/min — ABNORMAL LOW (ref >60)
Glucose, Bld: 155 mg/dL — ABNORMAL HIGH (ref 70–99)
Potassium: 4.6 mmol/L (ref 3.5–5.1)
Sodium: 135 mmol/L (ref 135–145)

## 2020-01-12 LAB — URIC ACID: Uric Acid, Serum: 5.7 mg/dL (ref 3.7–8.6)

## 2020-01-12 MED ORDER — SODIUM CHLORIDE 0.9% IV SOLUTION: Freq: Once | INTRAVENOUS | Status: AC

## 2020-01-12 NOTE — Progress Notes (Signed)
Pts hgb increased to 8.9 after 1 unit of RBCs. MD notified. Advised to hold off on giving 2nd unit of RBCs.

## 2020-01-12 NOTE — Progress Notes (Signed)
Subjective:     Patient is more comfortable today.  Less knee swelling and pain. No chills.  Hgb down to 6.4 last night.  Received 1 units of PRBC last night and getting 2 today. Hgb only 7.2 today.  May have to delay surgery on knee until bleeding stabilizes. No growth on early cultures. Have ordered uric acid to check on possibility of gout. Remains afebrile. Does not appear septic.  Have discussed with Dr Odis Luster who may wish to remove prosthesis tomorrow afternoon if he is more stable and it appears that infection is definite.    Patient reports pain as mild.  Objective:   VITALS:   Vitals:   01/12/20 1007 01/12/20 1123  BP: (!) 120/49   Pulse: 65   Resp: 18   Temp: 98.2 F (36.8 C) 99.2 F (37.3 C)  SpO2:      Neurologically intact Sensation intact distally Intact pulses distally Dorsiflexion/Plantar flexion intact  LABS Recent Labs    01/10/20 1837 01/11/20 0859 01/11/20 1807 01/11/20 1948 01/12/20 0527  HGB 8.0*   < > 6.9* 6.4* 7.2*  HCT 25.7*   < > 22.6* 22.1* 23.4*  WBC 18.1*  --   --   --   --   PLT 426*  --   --   --   --    < > = values in this interval not displayed.    Recent Labs    01/10/20 1837 01/11/20 0324 01/12/20 0527  NA 136 139 135  K 4.5 4.2 4.6  BUN 43* 40* 31*  CREATININE 1.77* 1.79* 1.71*  GLUCOSE 199* 184* 155*    Recent Labs    01/10/20 1837 01/11/20 0324  INR 1.9* 1.7*     Assessment/Plan:    Continue antibioics. Continue to support Hgb. GI to scope tomorrow morning NPO after MN Will evaluate for surgery on knee tomorrow.

## 2020-01-12 NOTE — Progress Notes (Signed)
PROGRESS NOTE    Randall Vincent  DXA:[id number] DOB: [birth year] DOA: 01/10/2020 PCP: Elspeth Cho., MD    Brief Narrative: Randall Vincent is a 79 y.o. male who complains of left knee pain and swelling for the last few days.  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  He has never had problems with the left knee until recently.  He was seen in our office at [facility] earlier this week complaining of pain and swelling.  He was scheduled for a bone scan to evaluate him for prosthetic loosening versus infection by Dr. Odis Luster.  However he developed GI bleeding in the last day or so and has presented to the emergency room for this reason primarily.  Hemoglobin is down to 7.2 from a high of 13.5 earlier this year.  White blood count is 18,100.  Temperature is about 100 F.  The left knee pain and swelling was noted as well as well as a slightly elevated white count and mild fever.  He had cardioversion in June and July and has remained in sinus rhythm he says.  He was switched from Eliquis to Pradaxa at that time and took his last Pradaxa dose yesterday.  He has not had any recent infections that he knows of.  He denies feeling any fever but has had a chill.  His medical history is complicated by cardiac problems with a history of coronary bypass graft: Surgery cardiac stent placement diabetes hyperlipidemia and hypertension.  Medications included albuterol aspirin Lipitor Pradaxa 150 mg twice daily Flonase Metformin Altase semaglutide Aldactone Ultram as needed and vitamin B-12.  Assessment & Plan:   Principal Problem:   Melena Active Problems:   CAD, multiple vessel   S/P CABG x 2   Atrial fibrillation status post cardioversion 10/22/19 [facility])   Essential hypertension   DM (diabetes mellitus), type 2 (HCC)   Symptomatic anemia   Acute blood loss anemia   Suspect  Septic arthritis (HCC)   Chronic anticoagulation   History of bilateral knee replacement   Chronic kidney disease, stage 3a   Acute on chronic congestive heart failure (HCC)   Elevated troponin    Acute blood loss anemia secondary to upper GI bleed/melena in the setting of chronic anticoagulation with Eliquis -Stop pradaxa. -IV Protonix bolus and infusion -Serial H&H and transfuse if necessary -Monitor closely.  Patient currently hemodynamically stable -GI consult -N.p.o. after midnight for possible endoscopy in the a.m. --EGD expected for Tuesday. --Cont iv ppi therapy and clear liquid diet.   Suspect Septic arthritis (HCC)    History of bilateral knee replacement -Patient with history of knee replacement several years ago presenting with a several week history of worsening pain and swelling of the right knee -Bone scan was ordered from the outpatient uncertain if performed -We will get baseline diagnostic imaging with x-ray -Orthopedic consult to direct further imaging studies to evaluate for septic joint and to arrange for arthrocentesis -Empiric vancomycin and Zosyn. --per ortho pt to have knee sx from hardware removal by Monday.  Mild acute on chronic congestive heart failure, unspecified (HCC)   Symptomatic anemia -Patient with mild shortness of breath above baseline, but pulmonary vascular congestion on chest x-ray, BNP elevated at 400 -Suspect triggered by acute blood loss anemia related to GI bleed above -Echocardiogram in the a.m. no prior echoes found on chart review -Holding  beta-blocker and ACE inhibitor in the setting of acute GI bleed and concern for hypotension -Low-dose Lasix only for overtly symptomatic dyspnea      Elevated troponin   CAD, multiple vessel   S/P CABG x 2 -Troponin 37>>> 42, suspect secondary to demand ischemia from above -Continue to monitor -Holding aspirin and metoprolol for now based on above acute issues -Might benefit from  cardiology consult during hospitalization. -Mildly elevated troponins to renal failure.  Continue to monitor for chest pain or clinical symptoms. -Home regimen statin continued, antiplatelet therapy with aspirin held, patient's home regimen with diltiazem held as I suspect patient's blood pressure may drop further. Patient's home regimen with Aldactone is also held, diltiazem was 240 at home we have changed it to every 8 hour regimen with parameters.     Atrial fibrillation status post cardioversion 10/22/19 [facility]) -Patient currently in sinus rhythm -Recently switched from Pradaxa to Eliquis on 12/18/2019 due to concern for renal function -Eliquis discontinued for now due to GI bleeding.    Essential hypertension -Antihypertensives as BP with tolerate. -home meds held due renal failure and possible hypotension. -diltiazem changed to po q8 with parameters.     DM (diabetes mellitus), type 2 (HCC) -Fingersticks every 4 while n.p.o. with sliding scale coverage. -Hypoglycemia protocol. -A1c is 6.7 and under good control. -Regimen is with sliding scale coverage only Metformin is discontinued due to recent renal insufficiency.  Acute kidney injury: -GFR at baseline -As mentioned above anticoagulant was switched from Pradaxa to Eliquis for preservation of renal function. -Avoid any nephrotoxic agents and contrast, renally dose meds as needed. - this is an aki and pt has h/o ckd as well.   DVT prophylaxis: SCDs Code Status: full code  Family Communication:  none  Disposition Plan: Back to previous home environment Consults called: GI, orthopedics  Subjective: Pt is alert oriented and here for GIB and suspected Left knee infection. Orthopedic and GI. Pt is stable clinically and denies any chest pain or sob. H/H is 7.2 and pt is receiving 2 units prbc and repeat is 8.6.  Pt seen today for f/u and is stable, denies any complaints  Objective: Vitals:   01/12/20 1000 01/12/20 1007  01/12/20 1123 01/12/20 1324  BP: (!) 121/49 (!) 120/49  (!) 130/58  Pulse: 64 65  70  Resp: 20 18  19   Temp:  98.2 F (36.8 C) 99.2 F (37.3 C) 98 F (36.7 C)  TempSrc:  Oral Oral Oral  SpO2: 97%   100%  Weight:      Height:        Intake/Output Summary (Last 24 hours) at 01/12/2020 1538 Last data filed at 01/12/2020 1324 Gross per 24 hour  Intake 3169.14 ml  Output 1750 ml  Net 1419.14 ml   Filed Weights   01/10/20 1826 01/12/20 0543  Weight: 108.9 kg 120.3 kg    Examination: Blood pressure (!) 130/58, pulse 70, temperature 98 F (36.7 C), temperature source Oral, resp. rate 19, height 5\' 10"  (1.778 m), weight 120.3 kg, SpO2 100 %. General exam: Appears calm and comfortable  Respiratory system: Clear to auscultation. Respiratory effort normal. Cardiovascular system: S1 & S2 heard, RRR. No JVD, murmurs, rubs, gallops or clicks. No pedal edema. Gastrointestinal system: Abdomen is nondistended, soft and nontender. No organomegaly or masses felt. Normal bowel sounds heard. Central nervous system: Alert and oriented. No focal neurological deficits. Extremities: Symmetric 5 x 5 power. Skin: No rashes, lesions or ulcers Psychiatry: Judgement and insight appear  normal. Mood & affect appropriate.   Data Reviewed: I have personally reviewed following labs and imaging studies  CBC: Recent Labs  Lab 01/10/20 1837 01/10/20 1837 01/11/20 0859 01/11/20 1807 01/11/20 1948 01/12/20 0527 01/12/20 1522  WBC 18.1*  --   --   --   --   --  13.6*  NEUTROABS 15.7*  --   --   --   --   --   --   HGB 8.0*   < > 7.2* 6.9* 6.4* 7.2* 8.9*  8.6*  HCT 25.7*   < > 22.0* 22.6* 22.1* 23.4* 27.1*  27.4*  MCV 84.0  --   --   --   --   --  85.6  PLT 426*  --   --   --   --   --  352   < > = values in this interval not displayed.   Basic Metabolic Panel: Recent Labs  Lab 01/10/20 1837 01/11/20 0324 01/12/20 0527  NA 136 139 135  K 4.5 4.2 4.6  CL 104 108 106  CO2 21* 20* 21*  GLUCOSE  199* 184* 155*  BUN 43* 40* 31*  CREATININE 1.77* 1.79* 1.71*  CALCIUM 8.5* 7.9* 8.3*   GFR: Estimated Creatinine Clearance: 46.3 mL/min (A) (by C-G formula based on SCr of 1.71 mg/dL (H)).  Coagulation Profile: Recent Labs  Lab 01/10/20 1837 01/11/20 0324  INR 1.9* 1.7*   HbA1C: Recent Labs    01/10/20 0037  HGBA1C 6.7*   CBG: Recent Labs  Lab 01/11/20 1738 01/11/20 2130 01/12/20 0408 01/12/20 0719 01/12/20 1121  GLUCAP 161* 123* 149* 130* 161*   Sepsis Labs: Recent Labs  Lab 01/10/20 1837 01/10/20 2028 01/11/20 0324  PROCALCITON  --   --  0.11  LATICACIDVEN 2.0* 1.3  --     Recent Results (from the past 240 hour(s))  Culture, blood (routine x 2)     Status: None (Preliminary result)   Collection Time: 01/11/20 12:37 AM   Specimen: BLOOD  Result Value Ref Range Status   Specimen Description BLOOD RIGHT ANTECUBITAL  Final   Special Requests   Final    BOTTLES DRAWN AEROBIC AND ANAEROBIC Blood Culture results may not be optimal due to an excessive volume of blood received in culture bottles   Culture   Final    NO GROWTH 1 DAY Performed at [facility], [address] [place], [place] [address]    Report Status PENDING  Incomplete  Culture, blood (routine x 2)     Status: None (Preliminary result)   Collection Time: 01/11/20 12:37 AM   Specimen: BLOOD  Result Value Ref Range Status   Specimen Description BLOOD LEFT ANTECUBITAL  Final   Special Requests   Final    BOTTLES DRAWN AEROBIC AND ANAEROBIC Blood Culture results may not be optimal due to an excessive volume of blood received in culture bottles   Culture   Final    NO GROWTH 1 DAY Performed at [facility], [address] [place], [place] [address]    Report Status PENDING  Incomplete  SARS Coronavirus 2 by RT PCR (hospital order, performed in [facility] hospital lab) Nasopharyngeal Nasopharyngeal Swab     Status: None   Collection Time: 01/11/20  6:14 AM   Specimen:  Nasopharyngeal Swab  Result Value Ref Range Status   SARS Coronavirus 2 NEGATIVE NEGATIVE Final    Comment: (NOTE) SARS-CoV-2 target nucleic acids are NOT DETECTED.  The  SARS-CoV-2 RNA is generally detectable in upper and lower respiratory specimens during the acute phase of infection. The lowest concentration of SARS-CoV-2 viral copies this assay can detect is 250 copies / mL. A negative result does not preclude SARS-CoV-2 infection and should not be used as the sole basis for treatment or other patient management decisions.  A negative result may occur with improper specimen collection / handling, submission of specimen other than nasopharyngeal swab, presence of viral mutation(s) within the areas targeted by this assay, and inadequate number of viral copies (<250 copies / mL). A negative result must be combined with clinical observations, patient history, and epidemiological information.  Fact Sheet for Patients:   BoilerBrush.com.cy  Fact Sheet for Healthcare Providers: https://pope.com/  This test is not yet approved or  cleared by the [place] FDA and has been authorized for detection and/or diagnosis of SARS-CoV-2 by FDA under an Emergency Use Authorization (EUA).  This EUA will remain in effect (meaning this test can be used) for the duration of the COVID-19 declaration under Section 564(b)(1) of the Act, 21 U.S.C. section 360bbb-3(b)(1), unless the authorization is terminated or revoked sooner.  Performed at [facility], [address] [place], [place] [address]   Culture, body fluid-bottle     Status: None (Preliminary result)   Collection Time: 01/11/20  6:07 PM   Specimen: Fluid  Result Value Ref Range Status   Specimen Description FLUID KNEE  Final   Special Requests   Final    BOTTLES DRAWN AEROBIC AND ANAEROBIC Blood Culture adequate volume   Culture   Final    NO GROWTH < 12 HOURS Performed at  [facility], [address] [place], [place] [address]    Report Status PENDING  Incomplete  Gram stain     Status: None   Collection Time: 01/11/20  6:07 PM   Specimen: Fluid  Result Value Ref Range Status   Specimen Description FLUID KNEE  Final   Special Requests NONE  Final   Gram Stain   Final    FEW WBC PRESENT,BOTH PMN AND MONONUCLEAR NO ORGANISMS SEEN Performed at [facility], [address] [place], [place] [address]    Report Status 01/12/2020 FINAL  Final     Radiology Studies: DG Chest 2 View  Result Date: 01/10/2020 CLINICAL DATA:  Weakness EXAM: CHEST - 2 VIEW COMPARISON:  06/26/2019 FINDINGS: Hazy interstitial opacities with a mid to lower lung predominance with some slight vascular cephalization/congestion. No pneumothorax. No effusion. No focal consolidation. Cardiomegaly appearance is similar to priors with evidence of prior sternotomy and CABG and a calcified, tortuous aorta. Cardiomediastinal contours are otherwise unremarkable. No acute osseous or soft tissue abnormality. Degenerative changes are present in the imaged spine and shoulders.  IMPRESSION: Features of CHF/volume overload with cardiomegaly and findings suggesting mild interstitial pulmonary edema. Electronically Signed   By: Kreg Shropshire M.D.   On: 01/10/2020 19:19   DG Knee 1-2 Views Left Result Date: 01/11/2020 CLINICAL DATA:  Bilateral knee replacements.  Pain and swelling. EXAM: LEFT KNEE - 1-2 VIEW COMPARISON:  None. FINDINGS: Left total knee arthroplasty without perihardware lucency. Intermediate sized suprapatellar joint effusion. No evidence of arthropathy or other focal bone abnormality. Soft tissues are unremarkable. IMPRESSION:  1. Intermediate sized suprapatellar joint effusion. 2. Uncomplicated left total knee arthroplasty.  Electronically Signed   By: Deatra Robinson M.D.   On: 01/11/2020 04:34   DG Knee 1-2 Views Right  Result Date: 01/11/2020  CLINICAL DATA:  Knee  replacements.  Swelling and pain. EXAM: RIGHT KNEE - 1-2 VIEW COMPARISON:  None. FINDINGS: There is a right total knee arthroplasty. No perihardware lucency. No evidence of arthropathy or other focal bone abnormality. Soft tissues are unremarkable. 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 Electronically Signed   By: Deatra Robinson M.D.   On: 01/11/2020 04:35   ECHOCARDIOGRAM COMPLETE  Result Date: 01/11/2020    ECHOCARDIOGRAM REPORT   Patient Name:   Randall Vincent Date of Exam: 01/11/2020 Medical Rec #:  [id number]     Height:       70.0 in Accession #:    [id number]    Weight:       240.0 lb Date of Birth:  [birth year]      BSA:          2.255 m Patient Age:    79 years      BP:           134/48 mmHg Patient Gender: M             HR:           74 bpm. Exam Location:  [facility] Procedure: 2D Echo, Cardiac Doppler and Color Doppler Indications:     CHF- acute diastolic 428.31  History:         Patient has no prior history of Echocardiogram examinations.                  Arrythmias:Atrial Fibrillation; Risk Factors:Hypertension,                  Diabetes and Dyslipidemia.  Sonographer:     Cristela Blue RDCS (AE) Referring Phys:  [id number] Andris Baumann Diagnosing Phys: Lorine Bears MD  Sonographer Comments: No apical window.  IMPRESSIONS   1. Left ventricular ejection fraction, by estimation, is 50 to 55%. The left ventricle has low normal function. Left ventricular endocardial border not optimally defined to evaluate regional wall motion. Left ventricular diastolic function could not be evaluated.   2. Right ventricular systolic function is normal. The right ventricular size is normal. Tricuspid regurgitation signal is inadequate for assessing PA pressure.   3. Left atrial size was mildly dilated.   4. The mitral valve is normal in structure. No evidence of mitral valve regurgitation. No evidence of mitral stenosis.   5. The aortic valve is normal in structure. Aortic valve regurgitation is  not visualized. Mild aortic valve sclerosis is present, with no evidence of aortic valve stenosis.   6. Mildly dilated pulmonary artery.   7. The inferior vena cava is normal in size with greater than 50% respiratory variability, suggesting right atrial pressure of 3 mmHg.  FINDINGS  Left Ventricle: Left ventricular ejection fraction, by estimation, is 50 to 55%. The left ventricle has low normal function. Left ventricular endocardial border not optimally defined to evaluate regional wall motion. The left ventricular internal cavity  size was normal in size. There is borderline left ventricular hypertrophy. Left ventricular diastolic function could not be evaluated. Right Ventricle: The right ventricular size is normal. No increase in right ventricular wall thickness. Right ventricular systolic function is normal. Tricuspid regurgitation signal is inadequate for assessing PA pressure. The tricuspid regurgitant velocity is 2.67 m/s, and with an assumed right atrial pressure of 5 mmHg, the estimated right ventricular systolic pressure is 33.5 mmHg. Left Atrium: Left atrial size was mildly dilated. Right Atrium: Right atrial size was normal in size. Pericardium: There  is no evidence of pericardial effusion. Mitral Valve: The mitral valve is normal in structure. Normal mobility of the mitral valve leaflets. Mild mitral annular calcification. No evidence of mitral valve regurgitation. No evidence of mitral valve stenosis. Tricuspid Valve: The tricuspid valve is normal in structure. Tricuspid valve regurgitation is trivial. No evidence of tricuspid stenosis. Aortic Valve: The aortic valve is normal in structure. Aortic valve regurgitation is not visualized. Mild aortic valve sclerosis is present, with no evidence of aortic valve stenosis. Pulmonic Valve: The pulmonic valve was normal in structure. Pulmonic valve regurgitation is trivial. No evidence of pulmonic stenosis. Aorta: The aortic root is normal in size and  structure. Pulmonary Artery: The pulmonary artery is mildly dilated. Venous: The inferior vena cava is normal in size with greater than 50% respiratory variability, suggesting right atrial pressure of 3 mmHg. IAS/Shunts: No atrial level shunt detected by color flow Doppler.  LEFT VENTRICLE PLAX 2D LVIDd:         5.30 cm LVIDs:         3.95 cm LV PW:         0.68 cm LV IVS:        1.77 cm LVOT diam:     2.00 cm LVOT Area:     3.14 cm  LEFT ATRIUM         Index LA diam:    4.70 cm 2.08 cm/m                        PULMONIC VALVE AORTA                 PV Vmax:        0.73 m/s Ao Root diam: 3.20 cm PV Peak grad:   2.1 mmHg                       RVOT Peak grad: 7 mmHg  TRICUSPID VALVE TR Peak grad:   28.5 mmHg TR Vmax:        267.00 cm/s  SHUNTS Systemic Diam: 2.00 cm Lorine Bears MD Electronically signed by Lorine Bears MD Signature Date/Time: 01/11/2020/5:11:53 PM    Final     Scheduled Meds: . sodium chloride   Intravenous Once  . amiodarone  200 mg Oral Daily  . atorvastatin  40 mg Oral Daily  . diltiazem  60 mg Oral Q8H  . insulin aspart  0-15 Units Subcutaneous Q4H  . [START ON 01/14/2020] pantoprazole  40 mg Intravenous Q12H  . vitamin B-12  1,000 mcg Oral Daily   Continuous Infusions: . sodium chloride 75 mL/hr at 01/12/20 0637  . pantoprozole (PROTONIX) infusion 8 mg/hr (01/12/20 1328)  . piperacillin-tazobactam (ZOSYN)  IV 3.375 g (01/12/20 0808)  . vancomycin Stopped (01/12/20 0223)     LOS: 2 days   [place] Pulse [facility] Pager [phone] If 7PM-7AM, please contact night-coverage [email or web address] Password [facility] 01/12/2020, 3:38 PM

## 2020-01-13 ENCOUNTER — Encounter: Payer: Self-pay | Admitting: Internal Medicine

## 2020-01-13 ENCOUNTER — Inpatient Hospital Stay: Payer: Medicare Other | Admitting: Anesthesiology

## 2020-01-13 ENCOUNTER — Encounter
Admission: EM | Disposition: A | Discharge status: Skilled Nursing Facility | Payer: Self-pay | Source: Home / Self Care | Attending: Internal Medicine

## 2020-01-13 DIAGNOSIS — K253 Acute gastric ulcer without hemorrhage or perforation: Secondary | ICD-10-CM

## 2020-01-13 HISTORY — PX: ESOPHAGOGASTRODUODENOSCOPY: SHX5428

## 2020-01-13 LAB — HEMOGLOBIN AND HEMATOCRIT, BLOOD
HCT: 24.4 % — ABNORMAL LOW (ref 39.0–52.0)
HCT: 28.7 % — ABNORMAL LOW (ref 39.0–52.0)
HCT: 28.8 % — ABNORMAL LOW (ref 39.0–52.0)
Hemoglobin: 8 g/dL — ABNORMAL LOW (ref 13.0–17.0)
Hemoglobin: 9 g/dL — ABNORMAL LOW (ref 13.0–17.0)
Hemoglobin: 9 g/dL — ABNORMAL LOW (ref 13.0–17.0)

## 2020-01-13 LAB — CBC
HCT: 26 % — ABNORMAL LOW (ref 39.0–52.0)
Hemoglobin: 8.5 g/dL — ABNORMAL LOW (ref 13.0–17.0)
MCH: 27.3 pg (ref 26.0–34.0)
MCHC: 32.7 g/dL (ref 30.0–36.0)
MCV: 83.6 fL (ref 80.0–100.0)
Platelets: 336 10*3/uL (ref 150–400)
RBC: 3.11 MIL/uL — ABNORMAL LOW (ref 4.22–5.81)
RDW: 17.1 % — ABNORMAL HIGH (ref 11.5–15.5)
WBC: 13.1 10*3/uL — ABNORMAL HIGH (ref 4.0–10.5)
nRBC: 0 % (ref 0.0–0.2)

## 2020-01-13 LAB — GLUCOSE, CAPILLARY
Glucose-Capillary: 119 mg/dL — ABNORMAL HIGH (ref 70–99)
Glucose-Capillary: 130 mg/dL — ABNORMAL HIGH (ref 70–99)
Glucose-Capillary: 113 mg/dL — ABNORMAL HIGH (ref 70–99)
Glucose-Capillary: 161 mg/dL — ABNORMAL HIGH (ref 70–99)
Glucose-Capillary: 160 mg/dL — ABNORMAL HIGH (ref 70–99)

## 2020-01-13 LAB — BASIC METABOLIC PANEL
Anion gap: 9 (ref 5–15)
BUN: 25 mg/dL — ABNORMAL HIGH (ref 8–23)
CO2: 17 mmol/L — ABNORMAL LOW (ref 22–32)
Calcium: 8 mg/dL — ABNORMAL LOW (ref 8.9–10.3)
Chloride: 107 mmol/L (ref 98–111)
Creatinine, Ser: 1.47 mg/dL — ABNORMAL HIGH (ref 0.61–1.24)
GFR calc Af Amer: 52 mL/min — ABNORMAL LOW (ref >60)
GFR calc non Af Amer: 45 mL/min — ABNORMAL LOW (ref >60)
Glucose, Bld: 128 mg/dL — ABNORMAL HIGH (ref 70–99)
Potassium: 4.7 mmol/L (ref 3.5–5.1)
Sodium: 133 mmol/L — ABNORMAL LOW (ref 135–145)

## 2020-01-13 LAB — MAGNESIUM: Magnesium: 2 mg/dL (ref 1.7–2.4)

## 2020-01-13 SURGERY — EGD (ESOPHAGOGASTRODUODENOSCOPY)
Anesthesia: General

## 2020-01-13 MED ORDER — SODIUM CHLORIDE 0.9 % IV SOLN: INTRAVENOUS | Status: DC

## 2020-01-13 MED ORDER — PROPOFOL 500 MG/50ML IV EMUL
INTRAVENOUS | Status: AC
  Filled 2020-01-13: qty 200

## 2020-01-13 MED ORDER — VANCOMYCIN HCL 1500 MG/300ML IV SOLN
1500.0000 mg | INTRAVENOUS | Status: DC
  Administered 2020-01-13 – 2020-01-14 (×2): 1500 mg via INTRAVENOUS
  Filled 2020-01-13 (×3): qty 300

## 2020-01-13 MED ORDER — PROPOFOL 10 MG/ML IV BOLUS
INTRAVENOUS | Status: AC
  Filled 2020-01-13: qty 20

## 2020-01-13 MED ORDER — SODIUM CHLORIDE 0.9 % IV SOLN
INTRAVENOUS | Status: DC | PRN
  Administered 2020-01-13: 250 mL via INTRAVENOUS
  Administered 2020-01-13: 1000 mL via INTRAVENOUS
  Administered 2020-01-14 (×3): 500 mL via INTRAVENOUS

## 2020-01-13 MED ORDER — PROPOFOL 500 MG/50ML IV EMUL
INTRAVENOUS | Status: DC | PRN
  Administered 2020-01-13: 110 mg via INTRAVENOUS
  Administered 2020-01-13: 50 mg via INTRAVENOUS

## 2020-01-13 NOTE — Consult Note (Addendum)
ORTHOPAEDIC CONSULTATION  REQUESTING PHYSICIAN: Randall Vincent, Randall V, MD  Chief Complaint: black stool, left knee pain  HPI: Randall Vincent is a 79 y.o. male who complains of left knee pain. Please see H&P and ED notes for details. Denies any numbness, tingling or constitutional symptoms.  Past Medical History:  Diagnosis Date  . A-fib (HCC)   . Diabetes (HCC)   . Hyperlipidemia   . Hypertension    Past Surgical History:  Procedure Laterality Date  . BLADDER SURGERY    . BYPASS GRAFT    . CARDIOVERSION    . COLON SURGERY    . REPLACEMENT TOTAL KNEE BILATERAL    . STENT PLACEMENT VASCULAR ([facility] HX)     Social History   Socioeconomic History  . Marital status: Widowed    Spouse name: Not on file  . Number of children: Not on file  . Years of education: Not on file  . Highest education level: Not on file  Occupational History  . Not on file  Tobacco Use  . Smoking status: Former Smoker    Quit date: 1991    Years since quitting: 30.6  . Smokeless tobacco: Never Used  Substance and Sexual Activity  . Alcohol use: Not on file  . Drug use: Not on file  . Sexual activity: Not on file  Other Topics Concern  . Not on file  Social History Narrative  . Not on file   Social Determinants of Health   Financial Resource Strain:   . Difficulty of Paying Living Expenses: Not on file  Food Insecurity:   . Worried About Programme researcher, broadcasting/film/videounning Out of Food in the Last Year: Not on file  . Ran Out of Food in the Last Year: Not on file  Transportation Needs:   . Lack of Transportation (Medical): Not on file  . Lack of Transportation (Non-Medical): Not on file  Physical Activity:   . Days of Exercise per Week: Not on file  . Minutes of Exercise per Session: Not on file  Stress:   . Feeling of Stress : Not on file  Social Connections:   . Frequency of Communication with Friends and Family: Not on file  . Frequency of Social Gatherings with Friends and Family: Not on file  . Attends Religious  Services: Not on file  . Active Member of Clubs or Organizations: Not on file  . Attends BankerClub or Organization Meetings: Not on file  . Marital Status: Not on file   History reviewed. No pertinent family history. No Known Allergies Prior to Admission medications   Medication Sig Start Date End Date Taking? Authorizing Provider  acetaminophen (TYLENOL) 500 MG tablet Take 500-1,000 mg by mouth every 6 (six) hours as needed for mild pain or fever.   Yes [provider]  albuterol (VENTOLIN HFA) 108 (90 Base) MCG/ACT inhaler Inhale 2 puffs into the lungs every 6 (six) hours as needed for wheezing.   Yes [provider]  amiodarone (PACERONE) 200 MG tablet Take 200 mg by mouth daily. 10/29/19  Yes [provider]  atorvastatin (LIPITOR) 40 MG tablet Take 40 mg by mouth daily. 06/18/19  Yes [provider]  dabigatran (PRADAXA) 75 MG CAPS capsule Take 75 mg by mouth 2 (two) times daily.    Yes [provider]  fluticasone (FLONASE) 50 MCG/ACT nasal spray Place 2 sprays into both nostrils daily as needed for allergies or rhinitis.   Yes [provider]  mupirocin ointment (BACTROBAN) 2 % Apply 1  application topically daily as needed (leg sores).  11/16/19  Yes [provider]  ramipril (ALTACE) 10 MG capsule Take 20 mg by mouth daily. 06/18/19  Yes [provider]  Semaglutide, 1 MG/DOSE, 2 MG/1.5ML SOPN Inject 1 mg into the skin every Thursday.   Yes [provider]  sildenafil (VIAGRA) 100 MG tablet Take 100 mg by mouth daily as needed for erectile dysfunction. 06/24/19  Yes [provider]  spironolactone (ALDACTONE) 25 MG tablet Take 25 mg by mouth daily. 06/18/19  Yes [provider]  traMADol (ULTRAM) 50 MG tablet Take 50 mg by mouth every 6 (six) hours as needed for moderate pain.   Yes [provider]  vitamin B-12 (CYANOCOBALAMIN) 1000 MCG tablet Take 1,000 mcg by mouth daily.   Yes [provider]   ECHOCARDIOGRAM COMPLETE  Result Date: 01/11/2020    ECHOCARDIOGRAM REPORT   Patient Name:   Randall Vincent Date of Exam: 01/11/2020 Medical Rec #:  [id number]     Height:       70.0 in Accession #:    [id number]    Weight:       240.0 lb Date of Birth:  1940-05-25      BSA:          2.255 m Patient Age:    79 years      BP:           134/48 mmHg Patient Gender: M             HR:           74 bpm. Exam Location:  [facility] Procedure: 2D Echo, Cardiac Doppler and Color Doppler Indications:     CHF- acute diastolic 428.31  History:         Patient has no prior history of Echocardiogram examinations.                  Arrythmias:Atrial Fibrillation; Risk Factors:Hypertension,                  Diabetes and Dyslipidemia.  Sonographer:     Cristela Blue RDCS (AE) Referring Phys:  [id number] Andris Baumann Diagnosing Phys: Lorine Bears MD  Sonographer Comments: No apical window. IMPRESSIONS  1. Left ventricular ejection fraction, by estimation, is 50 to 55%. The left ventricle has low normal function. Left ventricular endocardial border not optimally defined to evaluate regional wall motion. Left ventricular diastolic function could not be evaluated.  2. Right ventricular systolic function is normal. The right ventricular size is normal. Tricuspid regurgitation signal is inadequate for assessing PA pressure.  3. Left atrial size was mildly dilated.  4. The mitral valve is normal in structure. No evidence of mitral valve regurgitation. No evidence of mitral stenosis.  5. The aortic valve is normal in structure. Aortic valve regurgitation is not visualized. Mild aortic valve sclerosis is present, with no evidence of aortic valve stenosis.  6. Mildly dilated pulmonary artery.  7. The inferior vena cava is normal in size with greater than 50% respiratory variability, suggesting right atrial pressure of 3 mmHg. FINDINGS  Left Ventricle: Left ventricular ejection fraction, by estimation, is 50 to 55%. The left ventricle  has low normal function. Left ventricular endocardial border not optimally defined to evaluate regional wall motion. The left ventricular internal cavity  size was normal in size. There is borderline left ventricular hypertrophy. Left ventricular diastolic function could not be evaluated. Right Ventricle: The right ventricular size is normal. No  increase in right ventricular wall thickness. Right ventricular systolic function is normal. Tricuspid regurgitation signal is inadequate for assessing PA pressure. The tricuspid regurgitant velocity is 2.67 m/s, and with an assumed right atrial pressure of 5 mmHg, the estimated right ventricular systolic pressure is 33.5 mmHg. Left Atrium: Left atrial size was mildly dilated. Right Atrium: Right atrial size was normal in size. Pericardium: There is no evidence of pericardial effusion. Mitral Valve: The mitral valve is normal in structure. Normal mobility of the mitral valve leaflets. Mild mitral annular calcification. No evidence of mitral valve regurgitation. No evidence of mitral valve stenosis. Tricuspid Valve: The tricuspid valve is normal in structure. Tricuspid valve regurgitation is trivial. No evidence of tricuspid stenosis. Aortic Valve: The aortic valve is normal in structure. Aortic valve regurgitation is not visualized. Mild aortic valve sclerosis is present, with no evidence of aortic valve stenosis. Pulmonic Valve: The pulmonic valve was normal in structure. Pulmonic valve regurgitation is trivial. No evidence of pulmonic stenosis. Aorta: The aortic root is normal in size and structure. Pulmonary Artery: The pulmonary artery is mildly dilated. Venous: The inferior vena cava is normal in size with greater than 50% respiratory variability, suggesting right atrial pressure of 3 mmHg. IAS/Shunts: No atrial level shunt detected by color flow Doppler.  LEFT VENTRICLE PLAX 2D LVIDd:         5.30 cm LVIDs:         3.95 cm LV PW:         0.68 cm LV IVS:        1.77 cm  LVOT diam:     2.00 cm LVOT Area:     3.14 cm  LEFT ATRIUM         Index LA diam:    4.70 cm 2.08 cm/m                        PULMONIC VALVE AORTA                 PV Vmax:        0.73 m/s Ao Root diam: 3.20 cm PV Peak grad:   2.1 mmHg                       RVOT Peak grad: 7 mmHg  TRICUSPID VALVE TR Peak grad:   28.5 mmHg TR Vmax:        267.00 cm/s  SHUNTS Systemic Diam: 2.00 cm Lorine Bears MD Electronically signed by Lorine Bears MD Signature Date/Time: 01/11/2020/5:11:53 PM    Final     Positive ROS: All other systems have been reviewed and were otherwise negative with the exception of those mentioned in the HPI and as above.  Physical Exam: General: Alert, no acute distress Cardiovascular: No pedal edema Respiratory: No cyanosis, no use of accessory musculature GI: No organomegaly, abdomen is soft and non-tender Skin: No lesions in the area of chief complaint Neurologic: Sensation intact distally Psychiatric: Patient is competent for consent with normal mood and affect Lymphatic: No axillary or cervical lymphadenopathy  MUSCULOSKELETAL: mild swelling, no warmth, no erythema. Compartments soft. Good cap refill. Motor and sensory intact distally.  Assessment: Left knee pain GI bleeding  Plan: His knee exam is remarkably benign today and he is having minimal pain. Recent culture information positive for bacteria. Will likely require explantation and IV antibiotics when medically stable.  Will follow closely.     Lyndle Herrlich, MD  01/13/2020 1:47 PM

## 2020-01-13 NOTE — Care Management Important Message (Signed)
Important Message  Patient Details  Name: Randall Vincent MRN: [id number] Date of Birth: [birth year]   Medicare Important Message Given:  Yes     [name] 01/13/2020, 12:56 PM

## 2020-01-13 NOTE — Progress Notes (Signed)
Pharmacy Antibiotic Note  Randall Vincent is a 79 y.o. male admitted on 01/10/2020 with cellulitis.  Pharmacy has been consulted for Zosyn and Vancomycin dosing.  Plan: Zosyn 3.375g IV q8h (4 hour infusion).   Scr improved 1/77--->1.47, CrCL now 54.78ml/min   Will increase Vancomycin to Vancomycin 1500 mg IV Q 24 hrs.  Height: 5\' 10"  (177.8 cm) Weight: 122.4 kg (269 lb 14.4 oz) IBW/kg (Calculated) : 73  Temp (24hrs), Avg:98.3 F (36.8 C), Min:97.6 F (36.4 C), Max:99.2 F (37.3 C)  Recent Labs  Lab 01/10/20 1837 01/10/20 2028 01/11/20 0324 01/12/20 0527 01/12/20 1522 01/13/20 0444  WBC 18.1*  --   --   --  13.6* 13.1*  CREATININE 1.77*  --  1.79* 1.71*  --  1.47*  LATICACIDVEN 2.0* 1.3  --   --   --   --     Estimated Creatinine Clearance: 54.4 mL/min (A) (by C-G formula based on SCr of 1.47 mg/dL (H)).    No Known Allergies  Antimicrobials this admission:  Zosyn 8/20 >>   Vancomycin 8/21 >>   Dose adjustments this admission: 8/23 incr Vancomycin from 1000mg  q24h to 1500mg  q24h per improved renal function  Microbiology results: BCx: 8/21 NG x 1 day   WCx: GPC in clusters    COVID NEG  Thank you for allowing pharmacy to be a part of this patient's care.  , PharmD, BCPS Clinical Pharmacist 01/13/2020 7:48 AM

## 2020-01-13 NOTE — Transfer of Care (Signed)
Immediate Anesthesia Transfer of Care Note  Patient: Randall Vincent  Procedure(s) Performed: ESOPHAGOGASTRODUODENOSCOPY (EGD) (N/A )  Patient Location: PACU  Anesthesia Type:General  Level of Consciousness: awake, drowsy and patient cooperative  Airway & Oxygen Therapy: Patient Spontanous Breathing  Post-op Assessment: Report given to RN and Post -op Vital signs reviewed and stable  Post vital signs: Reviewed and stable  Last Vitals:  Vitals Value Taken Time  BP 120/48 01/13/20 1408  Temp    Pulse 73 01/13/20 1409  Resp 24 01/13/20 1409  SpO2 96 % 01/13/20 1409  Vitals shown include unvalidated device data.  Last Pain:  Vitals:   01/13/20 1408  TempSrc: (P) Tympanic  PainSc:          Complications: No complications documented.

## 2020-01-13 NOTE — Consult Note (Signed)
Patient seen and examined. Full consult note to follow. Would recommend bone scan left lower extremity while in hospital. Likely treat knee as an outpatient once medically stable. The cultures are negative and his exam is benign. Will follow. Please call with questions.

## 2020-01-13 NOTE — Progress Notes (Signed)
Randall Bouillon, MD [address], [address], [place], [place], [address] [address], [place], [place], [address] Phone: [phone]  Fax: [phone]   Subjective: Patient denies any further melena.  No abdominal pain.  No nausea or vomiting   Objective: Exam: Vital signs in last 24 hours: Vitals:   01/13/20 0000 01/13/20 0200 01/13/20 0514 01/13/20 0727  BP: (!) 120/46  (!) 145/55 (!) 130/52  Pulse: (!) 57 (!) 56 74 64  Resp: 18 19 19 17   Temp:   97.6 F (36.4 C) 98 F (36.7 C)  TempSrc:   Oral Oral  SpO2: 92% 93% 98% 95%  Weight:   122.4 kg   Height:       Weight change: 2.132 kg  Intake/Output Summary (Last 24 hours) at 01/13/2020 1124 Last data filed at 01/13/2020 0522 Gross per 24 hour  Intake 1622.85 ml  Output 1600 ml  Net 22.85 ml    General: No acute distress, AAO x3 Abd: Soft, NT/ND, No HSM Skin: Warm, no rashes Neck: Supple, Trachea midline   Lab Results: Lab Results  Component Value Date   WBC 13.1 (H) 01/13/2020   HGB 9.0 (L) 01/13/2020   HCT 28.7 (L) 01/13/2020   MCV 83.6 01/13/2020   PLT 336 01/13/2020   Micro Results: Recent Results (from the past 240 hour(s))  Culture, blood (routine x 2)     Status: None (Preliminary result)   Collection Time: 01/11/20 12:37 AM   Specimen: BLOOD  Result Value Ref Range Status   Specimen Description BLOOD RIGHT ANTECUBITAL  Final   Special Requests   Final    BOTTLES DRAWN AEROBIC AND ANAEROBIC Blood Culture results may not be optimal due to an excessive volume of blood received in culture bottles   Culture   Final    NO GROWTH 2 DAYS Performed at [facility], [address] [place], [place]    Report Status PENDING  Incomplete  Culture, blood (routine x 2)     Status: None (Preliminary result)   Collection Time: 01/11/20 12:37 AM   Specimen: BLOOD  Result Value Ref Range Status   Specimen Description BLOOD LEFT ANTECUBITAL  Final   Special Requests   Final     BOTTLES DRAWN AEROBIC AND ANAEROBIC Blood Culture results may not be optimal due to an excessive volume of blood received in culture bottles   Culture   Final    NO GROWTH 2 DAYS Performed at [facility], [address], [place], [place]    Report Status PENDING  Incomplete  SARS Coronavirus 2 by RT PCR (hospital order, performed in [facility] hospital lab) Nasopharyngeal Nasopharyngeal Swab     Status: None   Collection Time: 01/11/20  6:14 AM   Specimen: Nasopharyngeal Swab  Result Value Ref Range Status   SARS Coronavirus 2 NEGATIVE NEGATIVE Final    Comment: (NOTE) SARS-CoV-2 target nucleic acids are NOT DETECTED.  The SARS-CoV-2 RNA is generally detectable in upper and lower respiratory specimens during the acute phase of infection. The lowest concentration of SARS-CoV-2 viral copies this assay can detect is 250 copies / mL. A negative result does not preclude SARS-CoV-2 infection and should not be used as the sole basis for treatment or other patient management decisions.  A negative result may occur with improper specimen collection / handling, submission of specimen other than nasopharyngeal swab, presence of viral mutation(s) within the areas targeted by this assay, and inadequate number  of viral copies (<250 copies / mL). A negative result must be combined with clinical observations, patient history, and epidemiological information.  Fact Sheet for Patients:   BoilerBrush.com.cy  Fact Sheet for Healthcare Providers: https://pope.com/  This test is not yet approved or  cleared by the [place] FDA and has been authorized for detection and/or diagnosis of SARS-CoV-2 by FDA under an Emergency Use Authorization (EUA).  This EUA will remain in effect (meaning this test can be used) for the duration of the COVID-19 declaration under Section 564(b)(1) of the Act, 21 U.S.C. section 360bbb-3(b)(1),  unless the authorization is terminated or revoked sooner.  Performed at [facility], [address] [place], [place] [address]   Culture, body fluid-bottle     Status: None (Preliminary result)   Collection Time: 01/11/20  6:07 PM   Specimen: Fluid  Result Value Ref Range Status   Specimen Description FLUID KNEE  Final   Special Requests   Final    BOTTLES DRAWN AEROBIC AND ANAEROBIC Blood Culture adequate volume   Gram Stain   Final    GRAM POSITIVE COCCI IN CLUSTERS ANAEROBIC BOTTLE ONLY CRITICAL RESULT CALLED TO, READ BACK BY AND VERIFIED WITH: RN Sheria Lang [id number] FCP Performed at [facility], [address], [place], [place] [address]    Culture GRAM POSITIVE COCCI  Final   Report Status PENDING  Incomplete  Gram stain     Status: None   Collection Time: 01/11/20  6:07 PM   Specimen: Fluid  Result Value Ref Range Status   Specimen Description FLUID KNEE  Final   Special Requests NONE  Final   Gram Stain   Final    FEW WBC PRESENT,BOTH PMN AND MONONUCLEAR NO ORGANISMS SEEN Performed at [facility], [address] [place], [place] [address]    Report Status 01/12/2020 FINAL  Final   Studies/Results: ECHOCARDIOGRAM COMPLETE  Result Date: 01/11/2020    ECHOCARDIOGRAM REPORT   Patient Name:   Randall Vincent Date of Exam: 01/11/2020 Medical Rec #:  [id number]     Height:       70.0 in Accession #:    [id number]    Weight:       240.0 lb Date of Birth:  1940/09/26      BSA:          2.255 m Patient Age:    79 years      BP:           134/48 mmHg Patient Gender: M             HR:           74 bpm. Exam Location:  [facility] Procedure: 2D Echo, Cardiac Doppler and Color Doppler Indications:     CHF- acute diastolic 428.31  History:         Patient has no prior history of Echocardiogram examinations.                  Arrythmias:Atrial Fibrillation; Risk Factors:Hypertension,                  Diabetes and Dyslipidemia.  Sonographer:     Cristela Blue RDCS (AE)  Referring Phys:  [id number] Andris Baumann Diagnosing Phys: Lorine Bears MD  Sonographer Comments: No apical window. IMPRESSIONS  1. Left ventricular ejection fraction, by estimation, is 50 to 55%. The left ventricle has low normal function. Left ventricular endocardial border not optimally defined to evaluate regional  wall motion. Left ventricular diastolic function could not be evaluated.  2. Right ventricular systolic function is normal. The right ventricular size is normal. Tricuspid regurgitation signal is inadequate for assessing PA pressure.  3. Left atrial size was mildly dilated.  4. The mitral valve is normal in structure. No evidence of mitral valve regurgitation. No evidence of mitral stenosis.  5. The aortic valve is normal in structure. Aortic valve regurgitation is not visualized. Mild aortic valve sclerosis is present, with no evidence of aortic valve stenosis.  6. Mildly dilated pulmonary artery.  7. The inferior vena cava is normal in size with greater than 50% respiratory variability, suggesting right atrial pressure of 3 mmHg. FINDINGS  Left Ventricle: Left ventricular ejection fraction, by estimation, is 50 to 55%. The left ventricle has low normal function. Left ventricular endocardial border not optimally defined to evaluate regional wall motion. The left ventricular internal cavity  size was normal in size. There is borderline left ventricular hypertrophy. Left ventricular diastolic function could not be evaluated. Right Ventricle: The right ventricular size is normal. No increase in right ventricular wall thickness. Right ventricular systolic function is normal. Tricuspid regurgitation signal is inadequate for assessing PA pressure. The tricuspid regurgitant velocity is 2.67 m/s, and with an assumed right atrial pressure of 5 mmHg, the estimated right ventricular systolic pressure is 33.5 mmHg. Left Atrium: Left atrial size was mildly dilated. Right Atrium: Right atrial size was normal in  size. Pericardium: There is no evidence of pericardial effusion. Mitral Valve: The mitral valve is normal in structure. Normal mobility of the mitral valve leaflets. Mild mitral annular calcification. No evidence of mitral valve regurgitation. No evidence of mitral valve stenosis. Tricuspid Valve: The tricuspid valve is normal in structure. Tricuspid valve regurgitation is trivial. No evidence of tricuspid stenosis. Aortic Valve: The aortic valve is normal in structure. Aortic valve regurgitation is not visualized. Mild aortic valve sclerosis is present, with no evidence of aortic valve stenosis. Pulmonic Valve: The pulmonic valve was normal in structure. Pulmonic valve regurgitation is trivial. No evidence of pulmonic stenosis. Aorta: The aortic root is normal in size and structure. Pulmonary Artery: The pulmonary artery is mildly dilated. Venous: The inferior vena cava is normal in size with greater than 50% respiratory variability, suggesting right atrial pressure of 3 mmHg. IAS/Shunts: No atrial level shunt detected by color flow Doppler.  LEFT VENTRICLE PLAX 2D LVIDd:         5.30 cm LVIDs:         3.95 cm LV PW:         0.68 cm LV IVS:        1.77 cm LVOT diam:     2.00 cm LVOT Area:     3.14 cm  LEFT ATRIUM         Index LA diam:    4.70 cm 2.08 cm/m                        PULMONIC VALVE AORTA                 PV Vmax:        0.73 m/s Ao Root diam: 3.20 cm PV Peak grad:   2.1 mmHg                       RVOT Peak grad: 7 mmHg  TRICUSPID VALVE TR Peak grad:   28.5 mmHg TR Vmax:  267.00 cm/s  SHUNTS Systemic Diam: 2.00 cm Lorine BearsMuhammad Arida MD Electronically signed by Lorine BearsMuhammad Arida MD Signature Date/Time: 01/11/2020/5:11:53 PM    Final    Medications:  Scheduled Meds: . sodium chloride   Intravenous Once  . amiodarone  200 mg Oral Daily  . atorvastatin  40 mg Oral Daily  . diltiazem  60 mg Oral Q8H  . insulin aspart  0-15 Units Subcutaneous Q4H  . [START ON 01/14/2020] pantoprazole  40 mg Intravenous  Q12H  . vitamin B-12  1,000 mcg Oral Daily   Continuous Infusions: . sodium chloride 75 mL/hr at 01/13/20 0828  . sodium chloride    . sodium chloride 1,000 mL (01/13/20 0825)  . pantoprozole (PROTONIX) infusion 8 mg/hr (01/13/20 0522)  . piperacillin-tazobactam (ZOSYN)  IV 3.375 g (01/13/20 0830)  . vancomycin 1,500 mg (01/13/20 0826)   PRN Meds:.sodium chloride, acetaminophen **OR** acetaminophen, albuterol, fluticasone, HYDROcodone-acetaminophen, morphine injection, ondansetron **OR** ondansetron (ZOFRAN) IV, traMADol   Assessment: Principal Problem:   Melena Active Problems:   CAD, multiple vessel   S/P CABG x 2   Atrial fibrillation status post cardioversion 10/22/19 [facility](HCC)   Essential hypertension   DM (diabetes mellitus), type 2 (HCC)   Symptomatic anemia   Acute blood loss anemia   Suspect Septic arthritis (HCC)   Chronic anticoagulation   History of bilateral knee replacement   Chronic kidney disease, stage 3a   Acute on chronic congestive heart failure (HCC)   Elevated troponin    Plan: Proceed with upper endoscopy today for evaluation of melena and anemia on presentation  Coordinated with Ortho team, Dr. Hyacinth MeekerMiller, and they plan on taking patient for surgery after endoscopy, today or tomorrow  Further management for left knee infection by primary team and Ortho  I have discussed alternative options, risks & benefits,  which include, but are not limited to, bleeding, infection, perforation,respiratory complication & drug reaction.  The patient agrees with this plan & written consent will be obtained.      LOS: 3 days   Randall BouillonVarnita Abbe Bula, MD 01/13/2020, 11:24 AM

## 2020-01-13 NOTE — Anesthesia Preprocedure Evaluation (Addendum)
Anesthesia Evaluation  Patient identified by MRN, date of birth, ID band Patient awake    Reviewed: Allergy & Precautions, H&P , NPO status , Patient's Chart, lab work & pertinent test results, reviewed documented beta blocker date and time   Airway Mallampati: II   Neck ROM: full    Dental  (+) Poor Dentition   Pulmonary neg pulmonary ROS, former smoker,    Pulmonary exam normal        Cardiovascular Exercise Tolerance: Poor hypertension, On Medications + angina with exertion + CAD and +CHF  Normal cardiovascular exam Rhythm:regular Rate:Normal     Neuro/Psych negative neurological ROS  negative psych ROS   GI/Hepatic negative GI ROS, Neg liver ROS,   Endo/Other  negative endocrine ROSdiabetes, Well Controlled  Renal/GU Renal disease  negative genitourinary   Musculoskeletal   Abdominal   Peds  Hematology  (+) Blood dyscrasia, anemia ,   Anesthesia Other Findings Past Medical History: No date: A-fib (HCC) No date: Diabetes (HCC) No date: Hyperlipidemia No date: Hypertension Past Surgical History: No date: BLADDER SURGERY No date: BYPASS GRAFT No date: CARDIOVERSION No date: COLON SURGERY No date: REPLACEMENT TOTAL KNEE BILATERAL No date: STENT PLACEMENT VASCULAR ([facility] HX) BMI    Body Mass Index: 38.73 kg/m     Reproductive/Obstetrics negative OB ROS                             Anesthesia Physical Anesthesia Plan  ASA: III  Anesthesia Plan: General   Post-op Pain Management:    Induction:   PONV Risk Score and Plan:   Airway Management Planned:   Additional Equipment:   Intra-op Plan:   Post-operative Plan:   Informed Consent: I have reviewed the patients History and Physical, chart, labs and discussed the procedure including the risks, benefits and alternatives for the proposed anesthesia with the patient or authorized representative who has indicated his/her  understanding and acceptance.     Dental Advisory Given  Plan Discussed with: CRNA  Anesthesia Plan Comments:         Anesthesia Quick Evaluation

## 2020-01-13 NOTE — Op Note (Signed)
[facility] Patient Name: Randall Vincent Procedure Date: 01/13/2020 1:45 PM MRN: [id number] Account #: [id number] Date of Birth: [birth year] Admit Type: Inpatient Age: 79 Room: [facility] ENDO ROOM 1 Gender: Male Note Status: Finalized Procedure:             Upper GI endoscopy Indications:           Melena Providers:             Kristapher [place] B. Maximino Greenland MD, MD Referring MD:          Sallye Lat Md, MD (Referring MD) Medicines:             Monitored Anesthesia Care Complications:         No immediate complications. Procedure:             Pre-Anesthesia Assessment:                        - The risks and benefits of the procedure and the                         sedation options and risks were discussed with the                         patient. All questions were answered and informed                         consent was obtained.                        - Patient identification and proposed procedure were                         verified prior to the procedure.                        - ASA Grade Assessment: III - A patient with severe                         systemic disease.                        After obtaining informed consent, the endoscope was                         passed under direct vision. Throughout the procedure,                         the patient's blood pressure, pulse, and oxygen                         saturations were monitored continuously. The Endoscope                         was introduced through the mouth, and advanced to the                         second part of duodenum. The upper GI endoscopy was                         accomplished with ease.  The patient tolerated the                         procedure well. Findings:      One tongue of salmon-colored mucosa was present. Biopsy is       contraindicated because of recent melena and anemia.      The exam of the esophagus was otherwise normal.      Two small erosions with no bleeding and  no stigmata of recent bleeding       were found in the gastric antrum.      The exam of the stomach was otherwise normal.      The examined duodenum was normal. 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 Biopsy is contraindicated.                        - Erosive gastropathy with no bleeding and no stigmata                         of recent bleeding.                        - Normal examined duodenum.                        - No specimens collected.                        - The findings on today's exam do not reveal the cause                         of his anemia and melena. Recommendation:        - Pt would ideally need a colonoscopy. However, due to                         his knee pain and infected left knee pt was not able                         to lay on his left side and the Upper endoscopy was                         done while patient was laying supine. In order for a                         colonoscopy to be completed pt has to be position in                         the left lateral position and since this is unable to                         be done, I would recommend treatment of his infected                         left knee first and colonoscopy after  that once pt can                         safely lay on his left side. Pt is not actively                         bleeding and Hemoglobin has stabilized and therefore                         colonoscopy is not urgent at this time.                        - Return patient to hospital ward for ongoing care.                        - Continue present medications.                        - The findings and recommendations were discussed with                         the patient.                        - Continue Serial CBCs and transfuse PRN                        - Repeat upper endoscopy on an elective basis as an                         outpatient for biopsy  of salmon colored mucosa Procedure Code(s):     --- Professional ---                        [phone], Esophagogastroduodenoscopy, flexible,                         transoral; diagnostic, including collection of                         specimen(s) by brushing or washing, when performed                         (separate procedure) Diagnosis Code(s):     --- Professional ---                        K22.8, Other specified diseases of esophagus                        K31.89, Other diseases of stomach and duodenum                        K92.1, Melena (includes Hematochezia) CPT copyright 2019 [employer]. All rights reserved. The codes documented in this report are preliminary and upon coder review may  be revised to meet current compliance requirements.  [name], MD Michel Bickers B. Maximino Greenland MD, MD 01/13/2020 2:11:55 PM This report has been signed electronically. Number of Addenda: 0 Note Initiated On: 01/13/2020 1:45 PM Estimated Blood Loss:  Estimated blood loss: none.      [facility]

## 2020-01-13 NOTE — Progress Notes (Signed)
PROGRESS NOTE    Randall Vincent  DXA:[id number] DOB: [birth year] DOA: 01/10/2020 PCP: Elspeth Cho., MD    Brief Narrative: Randall Vincent is a 79 y.o. male who complains of left knee pain and swelling for the last few days.  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  He has never had problems with the left knee until recently.  He was seen in our office at [facility] earlier this week complaining of pain and swelling.  He was scheduled for a bone scan to evaluate him for prosthetic loosening versus infection by Dr. Odis Luster.  However he developed GI bleeding in the last day or so and has presented to the emergency room for this reason primarily.  Hemoglobin is down to 7.2 from a high of 13.5 earlier this year.  White blood count is 18,100.  Temperature is about 100 F.  The left knee pain and swelling was noted as well as well as a slightly elevated white count and mild fever.  He had cardioversion in June and July and has remained in sinus rhythm he says.  He was switched from Eliquis to Pradaxa at that time and took his last Pradaxa dose yesterday.  He has not had any recent infections that he knows of.  He denies feeling any fever but has had a chill.  His medical history is complicated by cardiac problems with a history of coronary bypass graft: Surgery cardiac stent placement diabetes hyperlipidemia and hypertension.  Medications included albuterol aspirin Lipitor Pradaxa 150 mg twice daily Flonase Metformin Altase semaglutide Aldactone Ultram as needed and vitamin B-12.  Assessment & Plan:   Principal Problem:   Melena Active Problems:   CAD, multiple vessel   S/P CABG x 2   Atrial fibrillation status post cardioversion 10/22/19 [facility])   Essential hypertension   DM (diabetes mellitus), type 2 (HCC)   Symptomatic anemia   Acute blood loss anemia   Suspect  Septic arthritis (HCC)   Chronic anticoagulation   History of bilateral knee replacement   Chronic kidney disease, stage 3a   Acute on chronic congestive heart failure (HCC)   Elevated troponin    Acute blood loss anemia secondary to upper GI bleed/melena in the setting of chronic anticoagulation with Eliquis -Stop pradaxa. -IV Protonix bolus and infusion -Serial H&H and transfuse if necessary -Monitor closely.  Patient currently hemodynamically stable -GI consult -N.p.o. after midnight for possible endoscopy in the a.m. --EGD expected for Tuesday. --Cont iv ppi therapy and clear liquid diet.   Suspect Septic arthritis (HCC)    History of bilateral knee replacement -Patient with history of knee replacement several years ago presenting with a several week history of worsening pain and swelling of the right knee -Bone scan was ordered from the outpatient uncertain if performed -We will get baseline diagnostic imaging with x-ray -Orthopedic consult to direct further imaging studies to evaluate for septic joint and to arrange for arthrocentesis -Empiric vancomycin and Zosyn. --per ortho pt to have knee sx from hardware removal by Monday.  Mild acute on chronic congestive heart failure, unspecified (HCC)   Symptomatic anemia -Patient with mild shortness of breath above baseline, but pulmonary vascular congestion on chest x-ray, BNP elevated at 400 -Suspect triggered by acute blood loss anemia related to GI bleed above -Echocardiogram in the a.m. no prior echoes found on chart review -Holding  beta-blocker and ACE inhibitor in the setting of acute GI bleed and concern for hypotension -Low-dose Lasix only for overtly symptomatic dyspnea      Elevated troponin   CAD, multiple vessel   S/P CABG x 2 -Troponin 37>>> 42, suspect secondary to demand ischemia from above -Continue to monitor -Holding aspirin and metoprolol for now based on above acute issues -Might benefit from  cardiology consult during hospitalization. -Mildly elevated troponins to renal failure.  Continue to monitor for chest pain or clinical symptoms. -Home regimen statin continued, antiplatelet therapy with aspirin held, patient's home regimen with diltiazem held as I suspect patient's blood pressure may drop further. Patient's home regimen with Aldactone is also held, diltiazem was 240 at home we have changed it to every 8 hour regimen with parameters.     Atrial fibrillation status post cardioversion 10/22/19 [facility]) -Patient currently in sinus rhythm -Recently switched from Pradaxa to Eliquis on 12/18/2019 due to concern for renal function -Eliquis discontinued for now due to GI bleeding.    Essential hypertension -Antihypertensives as BP with tolerate. -home meds held due renal failure and possible hypotension. -diltiazem changed to po q8 with parameters.     DM (diabetes mellitus), type 2 (HCC) -Fingersticks every 4 while n.p.o. with sliding scale coverage. -Hypoglycemia protocol. -A1c is 6.7 and under good control. -Regimen is with sliding scale coverage only Metformin is discontinued due to recent renal insufficiency.  Acute kidney injury: -GFR at baseline -As mentioned above anticoagulant was switched from Pradaxa to Eliquis for preservation of renal function. -Avoid any nephrotoxic agents and contrast, renally dose meds as needed. - this is an aki and pt has h/o ckd as well.   DVT prophylaxis: SCDs Code Status: full code  Family Communication:  none  Disposition Plan: Back to previous home environment Consults called: GI, orthopedics  Subjective: Pt is alert oriented and here for GIB and suspected Left knee infection. Orthopedic and GI. Pt is stable clinically and denies any chest pain or sob. H/H is 7.2 and pt is receiving 2 units prbc and repeat is 8.6.  Pt seen today for f/u and is stable, denies any complaints. Pt is alert and is due to have egd today. Denies any  complaints and revived his labs and h./h with him. egd shows  - Salmon-colored mucosa suspicious for short-segment Barrett's esophagus. Biopsy is contraindicated. - Erosive gastropathy with no bleeding and no stigmata of recent bleeding. - Normal examined duodenum. - No specimens collected. - The findings on today's exam do not reveal the cause of his anemia and melena. With recommendation to have left knee addressed and then to have a colonoscopy.  Gm positive cocci  in joint fluid and ID /c/s is pending and abx to be continued.   Objective: Vitals:   01/13/20 1132 01/13/20 1326 01/13/20 1408 01/13/20 1638  BP: (!) 152/67 (!) 161/65 (!) 120/48 (!) 153/49  Pulse: 75 81  85  Resp: Temp: 98 F (36.7 C) (!) 96.9 F (36.1 C) 98.2 F (36.8 C) 98.3 F (36.8 C)  TempSrc: Oral Tympanic Tympanic Oral  SpO2: 97% 98%  98%  Weight:  122 kg    Height:   (1.727 m)      Intake/Output Summary (Last 24 hours) at 01/13/2020 1826 Last data filed at 01/13/2020 1820 Gross per 24 hour  Intake 1109.31 ml  Output 2050 ml  Net -940.69 ml   Filed Weights   01/12/20 0543 01/13/20 0514 01/13/20 1326  Weight: 120.3 kg 122.4 kg 122 kg    Examination: Blood pressure (!) 153/49, pulse 85, temperature 98.3 F (36.8 C), temperature source Oral, resp. rate 17, height 5\' 8"  (1.727 m), weight 122 kg, SpO2 98 %. General exam: Appears calm and comfortable  Respiratory system: Clear to auscultation. Respiratory effort normal. Cardiovascular system: S1 & S2 heard, RRR. No JVD, murmurs, rubs, gallops or clicks. No pedal edema. Gastrointestinal system: Abdomen is nondistended, soft and nontender. No organomegaly or masses felt. Normal bowel sounds heard. Central nervous system: Alert and oriented. No focal neurological deficits. Extremities: Symmetric 5 x 5 power. Skin: No rashes, lesions or ulcers Psychiatry: Judgement and insight appear normal. Mood & affect appropriate.   Data Reviewed: I  have personally reviewed following labs and imaging studies  CBC: Recent Labs  Lab 01/10/20 1837 01/11/20 0859 01/12/20 1522 01/12/20 1522 01/12/20 1947 01/13/20 0148 01/13/20 0444 01/13/20 0917 01/13/20 1457  WBC 18.1*  --  13.6*  --   --   --  13.1*  --   --   NEUTROABS 15.7*  --   --   --   --   --   --   --   --   HGB 8.0*   < > 8.9*  8.6*   < > 8.3* 8.0* 8.5* 9.0* 9.0*  HCT 25.7*   < > 27.1*  27.4*   < > 25.7* 24.4* 26.0* 28.7* 28.8*  MCV 84.0  --  85.6  --   --   --  83.6  --   --   PLT 426*  --  352  --   --   --  336  --   --    < > = values in this interval not displayed.   Basic Metabolic Panel: Recent Labs  Lab 01/10/20 1837 01/11/20 0324 01/12/20 0527 01/13/20 0444  NA 136 139 135 133*  K 4.5 4.2 4.6 4.7  CL 104 108 106 107  CO2 21* 20* 21* 17*  GLUCOSE 199* 184* 155* 128*  BUN 43* 40* 31* 25*  CREATININE 1.77* 1.79* 1.71* 1.47*  CALCIUM 8.5* 7.9* 8.3* 8.0*  MG  --   --   --  2.0   GFR: Estimated Creatinine Clearance: 52.6 mL/min (A) (by C-G formula based on SCr of 1.47 mg/dL (H)).  Coagulation Profile: Recent Labs  Lab 01/10/20 1837 01/11/20 0324  INR 1.9* 1.7*   HbA1C: No results for input(s): HGBA1C in the last 72 hours. CBG: Recent Labs  Lab 01/12/20 2223 01/13/20 0258 01/13/20 0729 01/13/20 1133 01/13/20 1639  GLUCAP 138* 119* 130* 113* 161*   Sepsis Labs: Recent Labs  Lab 01/10/20 1837 01/10/20 2028 01/11/20 0324  PROCALCITON  --   --  0.11  LATICACIDVEN 2.0* 1.3  --     Recent Results (from the past 240 hour(s))  Culture, blood (routine x 2)     Status: None (Preliminary result)   Collection Time: 01/11/20 12:37 AM   Specimen: BLOOD  Result Value Ref Range Status   Specimen Description BLOOD RIGHT ANTECUBITAL  Final   Special Requests   Final    BOTTLES DRAWN AEROBIC AND ANAEROBIC Blood Culture results may not be optimal due to an excessive volume of blood received in culture bottles   Culture   Final    NO GROWTH 2  DAYS Performed at [facility], [address], [place], [place] [address]    Report Status PENDING  Incomplete  Culture, blood (routine  x 2)     Status: None (Preliminary result)   Collection Time: 01/11/20 12:37 AM   Specimen: BLOOD  Result Value Ref Range Status   Specimen Description BLOOD LEFT ANTECUBITAL  Final   Special Requests   Final    BOTTLES DRAWN AEROBIC AND ANAEROBIC Blood Culture results may not be optimal due to an excessive volume of blood received in culture bottles   Culture   Final    NO GROWTH 2 DAYS Performed at [facility], [address] [place], [place] [address]    Report Status PENDING  Incomplete  SARS Coronavirus 2 by RT PCR (hospital order, performed in [facility] hospital lab) Nasopharyngeal Nasopharyngeal Swab     Status: None   Collection Time: 01/11/20  6:14 AM   Specimen: Nasopharyngeal Swab  Result Value Ref Range Status   SARS Coronavirus 2 NEGATIVE NEGATIVE Final    Comment: (NOTE) SARS-CoV-2 target nucleic acids are NOT DETECTED.  The SARS-CoV-2 RNA is generally detectable in upper and lower respiratory specimens during the acute phase of infection. The lowest concentration of SARS-CoV-2 viral copies this assay can detect is 250 copies / mL. A negative result does not preclude SARS-CoV-2 infection and should not be used as the sole basis for treatment or other patient management decisions.  A negative result may occur with improper specimen collection / handling, submission of specimen other than nasopharyngeal swab, presence of viral mutation(s) within the areas targeted by this assay, and inadequate number of viral copies (<250 copies / mL). A negative result must be combined with clinical observations, patient history, and epidemiological information.  Fact Sheet for Patients:   BoilerBrush.com.cy  Fact Sheet for Healthcare Providers: https://pope.com/  This test  is not yet approved or  cleared by the [place] FDA and has been authorized for detection and/or diagnosis of SARS-CoV-2 by FDA under an Emergency Use Authorization (EUA).  This EUA will remain in effect (meaning this test can be used) for the duration of the COVID-19 declaration under Section 564(b)(1) of the Act, 21 U.S.C. section 360bbb-3(b)(1), unless the authorization is terminated or revoked sooner.  Performed at [facility], [address] [place], [place] [address]   Culture, body fluid-bottle     Status: None (Preliminary result)   Collection Time: 01/11/20  6:07 PM   Specimen: Fluid  Result Value Ref Range Status   Specimen Description FLUID KNEE  Final   Special Requests   Final    BOTTLES DRAWN AEROBIC AND ANAEROBIC Blood Culture adequate volume   Gram Stain   Final    GRAM POSITIVE COCCI IN CLUSTERS ANAEROBIC BOTTLE ONLY CRITICAL RESULT CALLED TO, READ BACK BY AND VERIFIED WITH: RN Sheria Lang [id number] FCP Performed at [facility], [address] [place], [place] [address]    Culture GRAM POSITIVE COCCI  Final   Report Status PENDING  Incomplete  Gram stain     Status: None   Collection Time: 01/11/20  6:07 PM   Specimen: Fluid  Result Value Ref Range Status   Specimen Description FLUID KNEE  Final   Special Requests NONE  Final   Gram Stain   Final    FEW WBC PRESENT,BOTH PMN AND MONONUCLEAR NO ORGANISMS SEEN Performed at [facility], [address] [place], [place] [address]    Report Status 01/12/2020 FINAL  Final     Radiology Studies: DG Chest 2 View  Result Date: 01/10/2020 CLINICAL DATA:  Weakness EXAM:  CHEST - 2 VIEW COMPARISON:  06/26/2019 FINDINGS: Hazy interstitial opacities with a mid to lower lung predominance with some slight vascular cephalization/congestion. No pneumothorax. No effusion. No focal consolidation. Cardiomegaly appearance is similar to priors with evidence of prior sternotomy and CABG and a  calcified, tortuous aorta. Cardiomediastinal contours are otherwise unremarkable. No acute osseous or soft tissue abnormality. Degenerative changes are present in the imaged spine and shoulders.  IMPRESSION: Features of CHF/volume overload with cardiomegaly and findings suggesting mild interstitial pulmonary edema. Electronically Signed   By: Kreg Shropshire M.D.   On: 01/10/2020 19:19   DG Knee 1-2 Views Left Result Date: 01/11/2020 CLINICAL DATA:  Bilateral knee replacements.  Pain and swelling. EXAM: LEFT KNEE - 1-2 VIEW COMPARISON:  None. FINDINGS: Left total knee arthroplasty without perihardware lucency. Intermediate sized suprapatellar joint effusion. No evidence of arthropathy or other focal bone abnormality. Soft tissues are unremarkable. IMPRESSION:  1. Intermediate sized suprapatellar joint effusion. 2. Uncomplicated left total knee arthroplasty.  Electronically Signed   By: Deatra Robinson M.D.   On: 01/11/2020 04:34   DG Knee 1-2 Views Right  Result Date: 01/11/2020 CLINICAL DATA:  Knee replacements.  Swelling and pain. EXAM: RIGHT KNEE - 1-2 VIEW COMPARISON:  None. FINDINGS: There is a right total knee arthroplasty. No perihardware lucency. No evidence of arthropathy or other focal bone abnormality. Soft tissues are unremarkable. 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 Electronically Signed   By: Deatra Robinson M.D.   On: 01/11/2020 04:35   ECHOCARDIOGRAM COMPLETE  Result Date: 01/11/2020    ECHOCARDIOGRAM REPORT   Patient Name:   JAMARIS THEARD Date of Exam: 01/11/2020 Medical Rec #:  [id number]     Height:       70.0 in Accession #:    [id number]    Weight:       240.0 lb Date of Birth:  05/23/1941      BSA:          2.255 m Patient Age:    79 years      BP:           134/48 mmHg Patient Gender: M             HR:           74 bpm. Exam Location:  [facility] Procedure: 2D Echo, Cardiac Doppler and Color Doppler Indications:     CHF- acute diastolic 428.31  History:          Patient has no prior history of Echocardiogram examinations.                  Arrythmias:Atrial Fibrillation; Risk Factors:Hypertension,                  Diabetes and Dyslipidemia.  Sonographer:     Cristela Blue RDCS (AE) Referring Phys:  [id number] Andris Baumann Diagnosing Phys: Lorine Bears MD  Sonographer Comments: No apical window.  IMPRESSIONS   1. Left ventricular ejection fraction, by estimation, is 50 to 55%. The left ventricle has low normal function. Left ventricular endocardial border not optimally defined to evaluate regional wall motion. Left ventricular diastolic function could not be evaluated.   2. Right ventricular systolic function is normal. The right ventricular size is normal. Tricuspid regurgitation signal is inadequate for assessing PA pressure.   3. Left atrial size was mildly dilated.   4. The mitral valve is normal in structure. No evidence of mitral valve regurgitation. No evidence of mitral stenosis.  5. The aortic valve is normal in structure. Aortic valve regurgitation is not visualized. Mild aortic valve sclerosis is present, with no evidence of aortic valve stenosis.   6. Mildly dilated pulmonary artery.   7. The inferior vena cava is normal in size with greater than 50% respiratory variability, suggesting right atrial pressure of 3 mmHg.  FINDINGS  Left Ventricle: Left ventricular ejection fraction, by estimation, is 50 to 55%. The left ventricle has low normal function. Left ventricular endocardial border not optimally defined to evaluate regional wall motion. The left ventricular internal cavity  size was normal in size. There is borderline left ventricular hypertrophy. Left ventricular diastolic function could not be evaluated. Right Ventricle: The right ventricular size is normal. No increase in right ventricular wall thickness. Right ventricular systolic function is normal. Tricuspid regurgitation signal is inadequate for assessing PA pressure. The tricuspid regurgitant  velocity is 2.67 m/s, and with an assumed right atrial pressure of 5 mmHg, the estimated right ventricular systolic pressure is 33.5 mmHg. Left Atrium: Left atrial size was mildly dilated. Right Atrium: Right atrial size was normal in size. Pericardium: There is no evidence of pericardial effusion. Mitral Valve: The mitral valve is normal in structure. Normal mobility of the mitral valve leaflets. Mild mitral annular calcification. No evidence of mitral valve regurgitation. No evidence of mitral valve stenosis. Tricuspid Valve: The tricuspid valve is normal in structure. Tricuspid valve regurgitation is trivial. No evidence of tricuspid stenosis. Aortic Valve: The aortic valve is normal in structure. Aortic valve regurgitation is not visualized. Mild aortic valve sclerosis is present, with no evidence of aortic valve stenosis. Pulmonic Valve: The pulmonic valve was normal in structure. Pulmonic valve regurgitation is trivial. No evidence of pulmonic stenosis. Aorta: The aortic root is normal in size and structure. Pulmonary Artery: The pulmonary artery is mildly dilated. Venous: The inferior vena cava is normal in size with greater than 50% respiratory variability, suggesting right atrial pressure of 3 mmHg. IAS/Shunts: No atrial level shunt detected by color flow Doppler.  LEFT VENTRICLE PLAX 2D LVIDd:         5.30 cm LVIDs:         3.95 cm LV PW:         0.68 cm LV IVS:        1.77 cm LVOT diam:     2.00 cm LVOT Area:     3.14 cm  LEFT ATRIUM         Index LA diam:    4.70 cm 2.08 cm/m                        PULMONIC VALVE AORTA                 PV Vmax:        0.73 m/s Ao Root diam: 3.20 cm PV Peak grad:   2.1 mmHg                       RVOT Peak grad: 7 mmHg  TRICUSPID VALVE TR Peak grad:   28.5 mmHg TR Vmax:        267.00 cm/s  SHUNTS Systemic Diam: 2.00 cm Lorine Bears MD Electronically signed by Lorine Bears MD Signature Date/Time: 01/11/2020/5:11:53 PM    Final     Scheduled Meds: . sodium chloride    Intravenous Once  . amiodarone  200 mg Oral Daily  . atorvastatin  40 mg Oral Daily  .  diltiazem  60 mg Oral Q8H  . insulin aspart  0-15 Units Subcutaneous Q4H  . [START ON 01/14/2020] pantoprazole  40 mg Intravenous Q12H  . vitamin B-12  1,000 mcg Oral Daily   Continuous Infusions: . sodium chloride Stopped (01/13/20 1757)  . sodium chloride 10 mL/hr at 01/13/20 1309  . pantoprozole (PROTONIX) infusion 8 mg/hr (01/13/20 1820)  . piperacillin-tazobactam (ZOSYN)  IV 12.5 mL/hr at 01/13/20 1820  . vancomycin Stopped (01/13/20 1038)     LOS: 3 days   Domingo Pulsekta V Hildy Nicholl,MD [facility] Pager [phone] If 7PM-7AM, please contact night-coverage [email or web address] Password [email or web address] 01/13/2020, 6:26 PM

## 2020-01-13 NOTE — Anesthesia Postprocedure Evaluation (Signed)
Anesthesia Post Note  Patient: [name]  Procedure(s) Performed: ESOPHAGOGASTRODUODENOSCOPY (EGD) (N/A )  Patient location during evaluation: PACU Anesthesia Type: General Level of consciousness: awake and alert Pain management: pain level controlled Vital Signs Assessment: post-procedure vital signs reviewed and stable Respiratory status: spontaneous breathing, nonlabored ventilation, respiratory function stable and patient connected to nasal cannula oxygen Cardiovascular status: blood pressure returned to baseline and stable Postop Assessment: no apparent nausea or vomiting Anesthetic complications: no   No complications documented.   Last Vitals:  Vitals:   01/13/20 1326 01/13/20 1408  BP: (!) 161/65 (!) 120/48  Pulse: 81   Resp: 16   Temp: (!) 36.1 C 36.8 C  SpO2: 98%     Last Pain:  Vitals:   01/13/20 1408  TempSrc: Tympanic  PainSc: 0-No pain                 Yevette Edwards

## 2020-01-14 LAB — BPAM RBC
Blood Product Expiration Date: 202109152359
Blood Product Expiration Date: 202109222359
Blood Product Expiration Date: 202109232359
ISSUE DATE / TIME: 202108220044
ISSUE DATE / TIME: 202108220947
Unit Type and Rh: 7300
Unit Type and Rh: 7300
Unit Type and Rh: 7300

## 2020-01-14 LAB — BASIC METABOLIC PANEL
Anion gap: 10 (ref 5–15)
BUN: 22 mg/dL (ref 8–23)
CO2: 20 mmol/L — ABNORMAL LOW (ref 22–32)
Calcium: 8.4 mg/dL — ABNORMAL LOW (ref 8.9–10.3)
Chloride: 106 mmol/L (ref 98–111)
Creatinine, Ser: 1.55 mg/dL — ABNORMAL HIGH (ref 0.61–1.24)
GFR calc Af Amer: 49 mL/min — ABNORMAL LOW (ref >60)
GFR calc non Af Amer: 42 mL/min — ABNORMAL LOW (ref >60)
Glucose, Bld: 150 mg/dL — ABNORMAL HIGH (ref 70–99)
Potassium: 4.3 mmol/L (ref 3.5–5.1)
Sodium: 136 mmol/L (ref 135–145)

## 2020-01-14 LAB — CBC
HCT: 27.3 % — ABNORMAL LOW (ref 39.0–52.0)
Hemoglobin: 8.5 g/dL — ABNORMAL LOW (ref 13.0–17.0)
MCH: 26.9 pg (ref 26.0–34.0)
MCHC: 31.1 g/dL (ref 30.0–36.0)
MCV: 86.4 fL (ref 80.0–100.0)
Platelets: 352 10*3/uL (ref 150–400)
RBC: 3.16 MIL/uL — ABNORMAL LOW (ref 4.22–5.81)
RDW: 17.2 % — ABNORMAL HIGH (ref 11.5–15.5)
WBC: 12.1 10*3/uL — ABNORMAL HIGH (ref 4.0–10.5)
nRBC: 0 % (ref 0.0–0.2)

## 2020-01-14 LAB — TYPE AND SCREEN
ABO/RH(D): B POS
Antibody Screen: NEGATIVE
Unit division: 0
Unit division: 0
Unit division: 0

## 2020-01-14 LAB — GLUCOSE, CAPILLARY
Glucose-Capillary: 146 mg/dL — ABNORMAL HIGH (ref 70–99)
Glucose-Capillary: 137 mg/dL — ABNORMAL HIGH (ref 70–99)
Glucose-Capillary: 215 mg/dL — ABNORMAL HIGH (ref 70–99)
Glucose-Capillary: 126 mg/dL — ABNORMAL HIGH (ref 70–99)
Glucose-Capillary: 137 mg/dL — ABNORMAL HIGH (ref 70–99)

## 2020-01-14 LAB — PREPARE RBC (CROSSMATCH)

## 2020-01-14 MED ORDER — DILTIAZEM HCL 30 MG PO TABS: 90.0000 mg | ORAL_TABLET | Freq: Three times a day (TID) | ORAL | Status: DC

## 2020-01-14 MED ORDER — PEG 3350-KCL-NA BICARB-NACL 420 G PO SOLR
4000.0000 mL | Freq: Once | ORAL | Status: AC
  Administered 2020-01-14: 4000 mL via ORAL
  Filled 2020-01-14: qty 4000

## 2020-01-14 MED ORDER — DILTIAZEM HCL 30 MG PO TABS
60.0000 mg | ORAL_TABLET | Freq: Three times a day (TID) | ORAL | Status: DC
  Administered 2020-01-14 – 2020-01-22 (×22): 60 mg via ORAL
  Filled 2020-01-14 (×22): qty 2

## 2020-01-14 MED ORDER — BISACODYL 5 MG PO TBEC
10.0000 mg | DELAYED_RELEASE_TABLET | Freq: Once | ORAL | Status: AC
  Administered 2020-01-14: 10 mg via ORAL
  Filled 2020-01-14: qty 2

## 2020-01-14 NOTE — Progress Notes (Signed)
Subjective:  Patient reports pain as moderate.  Left knee medial pain.  Objective:   VITALS:   Vitals:   01/14/20 0000 01/14/20 0509 01/14/20 0826 01/14/20 1206  BP:  (!) 156/63 (!) 163/69 (!) 151/58  Pulse: 74 74 75 71  Resp: (!) 24  19 18   Temp:  98 F (36.7 C) 98 F (36.7 C) 98 F (36.7 C)  TempSrc:  Oral Oral Oral  SpO2: 92% 96% 98% 97%  Weight:  122 kg    Height:        PHYSICAL EXAM:  Neurovascular intact Dorsiflexion/Plantar flexion intact No cellulitis present Compartment soft  LABS  Results for orders placed or performed during the hospital encounter of 01/10/20 (from the past 24 hour(s))  Hemoglobin and hematocrit, blood     Status: Abnormal   Collection Time: 01/13/20  2:57 PM  Result Value Ref Range   Hemoglobin 9.0 (L) 13.0 - 17.0 g/dL   HCT 01/15/20 (L) 39 - 52 %  Glucose, capillary     Status: Abnormal   Collection Time: 01/13/20  4:39 PM  Result Value Ref Range   Glucose-Capillary 161 (H) 70 - 99 mg/dL  Glucose, capillary     Status: Abnormal   Collection Time: 01/13/20  9:31 PM  Result Value Ref Range   Glucose-Capillary 160 (H) 70 - 99 mg/dL  Glucose, capillary     Status: Abnormal   Collection Time: 01/14/20  5:16 AM  Result Value Ref Range   Glucose-Capillary 146 (H) 70 - 99 mg/dL  Basic metabolic panel     Status: Abnormal   Collection Time: 01/14/20  5:24 AM  Result Value Ref Range   Sodium 136 135 - 145 mmol/L   Potassium 4.3 3.5 - 5.1 mmol/L   Chloride 106 98 - 111 mmol/L   CO2 20 (L) 22 - 32 mmol/L   Glucose, Bld 150 (H) 70 - 99 mg/dL   BUN 22 8 - 23 mg/dL   Creatinine, Ser 01/16/20 (H) 0.61 - 1.24 mg/dL   Calcium 8.4 (L) 8.9 - 10.3 mg/dL   GFR calc non Af Amer 42 (L) >60 mL/min   GFR calc Af Amer 49 (L) >60 mL/min   Anion gap 10 5 - 15  CBC     Status: Abnormal   Collection Time: 01/14/20  5:24 AM  Result Value Ref Range   WBC 12.1 (H) 4.0 - 10.5 K/uL   RBC 3.16 (L) 4.22 - 5.81 MIL/uL   Hemoglobin 8.5 (L) 13.0 - 17.0 g/dL   HCT  01/16/20 (L) 39 - 52 %   MCV 86.4 80.0 - 100.0 fL   MCH 26.9 26.0 - 34.0 pg   MCHC 31.1 30.0 - 36.0 g/dL   RDW 04.5 (H) 40.9 - 81.1 %   Platelets 352 150 - 400 K/uL   nRBC 0.0 0.0 - 0.2 %  Glucose, capillary     Status: Abnormal   Collection Time: 01/14/20  8:28 AM  Result Value Ref Range   Glucose-Capillary 137 (H) 70 - 99 mg/dL  Glucose, capillary     Status: Abnormal   Collection Time: 01/14/20 11:13 AM  Result Value Ref Range   Glucose-Capillary 215 (H) 70 - 99 mg/dL    No results found.  Assessment/Plan: 1 Day Post-Op   Principal Problem:   Melena Active Problems:   CAD, multiple vessel   S/P CABG x 2   Atrial fibrillation status post cardioversion 10/22/19 [facility])   Essential hypertension   DM (  diabetes mellitus), type 2 (HCC)   Symptomatic anemia   Acute blood loss anemia   Suspect Septic arthritis (HCC)   Chronic anticoagulation   History of bilateral knee replacement   Chronic kidney disease, stage 3a   Acute on chronic congestive heart failure (HCC)   Elevated troponin   Up with therapy   Plan on removal of left total knee and placement of antibiotic spacer on Thursday. Medical optimization prior to surgery. ID consult regarding antibiotic choice and duration.  The diagnosis, risks, benefits and alternatives to treatment are all discussed in detail with the patient. Risks include but are not limited to bleeding, infection, deep vein thrombosis, pulmonary embolism, nerve or vascular injury, non-union, repeat operation, persistent pain, weakness, stiffness and death. He understands and is eager to proceed.   Lyndle Herrlich , MD 01/14/2020, 2:06 PM

## 2020-01-14 NOTE — Progress Notes (Addendum)
[name], MD [address], [place], [place], [address] [address], [place], [place], [address] Phone: [phone]  Fax: [phone]   Subjective: Patient resting in bed comfortably.  No further episodes of melena since initial episode that brought him to the hospital.  No abdominal pain.  Patient pending knee replacement this Thursday   Objective: Exam: Vital signs in last 24 hours: Vitals:   01/14/20 0000 01/14/20 0509 01/14/20 0826 01/14/20 1206  BP:  (!) 156/63 (!) 163/69 (!) 151/58  Pulse: 74 74 75 71  Resp: (!) 24  19 18   Temp:  98 F (36.7 C) 98 F (36.7 C) 98 F (36.7 C)  TempSrc:  Oral Oral Oral  SpO2: 92% 96% 98% 97%  Weight:  122 kg    Height:       Weight change: -0.408 kg  Intake/Output Summary (Last 24 hours) at 01/14/2020 1437 Last data filed at 01/14/2020 1207 Gross per 24 hour  Intake 884.31 ml  Output 2850 ml  Net -1965.69 ml    General: No acute distress, AAO x3 Abd: Soft, NT/ND, No HSM Skin: Warm, no rashes Neck: Supple, Trachea midline   Lab Results: Lab Results  Component Value Date   WBC 12.1 (H) 01/14/2020   HGB 8.5 (L) 01/14/2020   HCT 27.3 (L) 01/14/2020   MCV 86.4 01/14/2020   PLT 352 01/14/2020   Micro Results: Recent Results (from the past 240 hour(s))  Culture, blood (routine x 2)     Status: None (Preliminary result)   Collection Time: 01/11/20 12:37 AM   Specimen: BLOOD  Result Value Ref Range Status   Specimen Description BLOOD RIGHT ANTECUBITAL  Final   Special Requests   Final    BOTTLES DRAWN AEROBIC AND ANAEROBIC Blood Culture results may not be optimal due to an excessive volume of blood received in culture bottles   Culture   Final    NO GROWTH 3 DAYS Performed at [facility], [address] [place], [place]    Report Status PENDING  Incomplete  Culture, blood (routine x 2)     Status: None (Preliminary result)   Collection Time: 01/11/20 12:37 AM    Specimen: BLOOD  Result Value Ref Range Status   Specimen Description BLOOD LEFT ANTECUBITAL  Final   Special Requests   Final    BOTTLES DRAWN AEROBIC AND ANAEROBIC Blood Culture results may not be optimal due to an excessive volume of blood received in culture bottles   Culture   Final    NO GROWTH 3 DAYS Performed at [facility], [address] [place], [place]    Report Status PENDING  Incomplete  SARS Coronavirus 2 by RT PCR (hospital order, performed in [facility] hospital lab) Nasopharyngeal Nasopharyngeal Swab     Status: None   Collection Time: 01/11/20  6:14 AM   Specimen: Nasopharyngeal Swab  Result Value Ref Range Status   SARS Coronavirus 2 NEGATIVE NEGATIVE Final    Comment: (NOTE) SARS-CoV-2 target nucleic acids are NOT DETECTED.  The SARS-CoV-2 RNA is generally detectable in upper and lower respiratory specimens during the acute phase of infection. The lowest concentration of SARS-CoV-2 viral copies this assay can detect is 250 copies / mL. A negative result does not preclude SARS-CoV-2 infection and should not be used as the sole basis for treatment or other patient management decisions.  A negative result may occur with improper specimen collection / handling, submission of  specimen other than nasopharyngeal swab, presence of viral mutation(s) within the areas targeted by this assay, and inadequate number of viral copies (<250 copies / mL). A negative result must be combined with clinical observations, patient history, and epidemiological information.  Fact Sheet for Patients:   BoilerBrush.com.cy  Fact Sheet for Healthcare Providers: https://pope.com/  This test is not yet approved or  cleared by the [place] FDA and has been authorized for detection and/or diagnosis of SARS-CoV-2 by FDA under an Emergency Use Authorization (EUA).  This EUA will remain in effect (meaning this test can  be used) for the duration of the COVID-19 declaration under Section 564(b)(1) of the Act, 21 U.S.C. section 360bbb-3(b)(1), unless the authorization is terminated or revoked sooner.  Performed at [facility], [address] [place], [place] [address]   Culture, body fluid-bottle     Status: Abnormal (Preliminary result)   Collection Time: 01/11/20  6:07 PM   Specimen: Fluid  Result Value Ref Range Status   Specimen Description FLUID KNEE  Final   Special Requests   Final    BOTTLES DRAWN AEROBIC AND ANAEROBIC Blood Culture adequate volume   Gram Stain   Final    GRAM POSITIVE COCCI IN CLUSTERS ANAEROBIC BOTTLE ONLY CRITICAL RESULT CALLED TO, READ BACK BY AND VERIFIED WITH: RN Sheria Lang [id number] FCP    Culture (A)  Final    ENTEROCOCCUS FAECALIS SUSCEPTIBILITIES TO FOLLOW Performed at [facility], [address], [place], [place] [address]    Report Status PENDING  Incomplete  Gram stain     Status: None   Collection Time: 01/11/20  6:07 PM   Specimen: Fluid  Result Value Ref Range Status   Specimen Description FLUID KNEE  Final   Special Requests NONE  Final   Gram Stain   Final    FEW WBC PRESENT,BOTH PMN AND MONONUCLEAR NO ORGANISMS SEEN Performed at [facility], [address] [place], [place] [address]    Report Status 01/12/2020 FINAL  Final   Studies/Results: No results found. Medications:  Scheduled Meds: . sodium chloride   Intravenous Once  . amiodarone  200 mg Oral Daily  . atorvastatin  40 mg Oral Daily  . diltiazem  60 mg Oral Q8H  . insulin aspart  0-15 Units Subcutaneous Q4H  . pantoprazole  40 mg Intravenous Q12H  . vitamin B-12  1,000 mcg Oral Daily   Continuous Infusions: . sodium chloride Stopped (01/14/20 0154)  . sodium chloride 500 mL (01/14/20 0918)  . piperacillin-tazobactam (ZOSYN)  IV 3.375 g (01/14/20 0919)  . vancomycin 1,500 mg (01/14/20 0917)   PRN Meds:.sodium chloride, acetaminophen **OR**  acetaminophen, albuterol, fluticasone, HYDROcodone-acetaminophen, morphine injection, ondansetron **OR** ondansetron (ZOFRAN) IV, traMADol   Assessment: Principal Problem:   Melena Active Problems:   CAD, multiple vessel   S/P CABG x 2   Atrial fibrillation status post cardioversion 10/22/19 [facility])   Essential hypertension   DM (diabetes mellitus), type 2 (HCC)   Symptomatic anemia   Acute blood loss anemia   Suspect Septic arthritis (HCC)   Chronic anticoagulation   History of bilateral knee replacement   Chronic kidney disease, stage 3a   Acute on chronic congestive heart failure (HCC)   Elevated troponin    Plan: Since the source of patient's melena was not evident on EGD, next it would be colonoscopy  I discussed with Dr. Odis Luster, from Ortho, and he does not see any issues with having to lay  patient on his left side for the colonoscopy  We discussed this with the patient and he had several questions, and his concerns about his previous colon surgery when he had diverticulitis with perforation in 2000 that required partial colon resection.  All his questions were answered in detail and he would like to proceed with colonoscopy tomorrow  Given that he has not had a colonoscopy since at least 2000, and has had melena, there is a risk of underlying colon malignancy that caused his anemia and melena.  He understands this risk and would like to proceed with colonoscopy  Clear liquid diet today Prep orders placed  I have discussed alternative options, risks & benefits,  which include, but are not limited to, bleeding, infection, perforation,respiratory complication & drug reaction.  The patient agrees with this plan & written consent will be obtained.      LOS: 4 days   [name], MD 01/14/2020, 2:37 PM

## 2020-01-14 NOTE — Progress Notes (Signed)
Patient in good spirits today. PT came and moved patient to chair. Tolerated it well. NT Keda and I moved patient back into bed this afternoon. Patient was changed to clear liquid diet at lunch today to prepare for colonoscopy on Wednesday. Patient will be NPO at midnight tonight. Patient currently drinking prep with Son at bedside. Patient also given laxative ordered by MD for bowel prep. Patient has no needs at this time.

## 2020-01-14 NOTE — Progress Notes (Signed)
PROGRESS NOTE    Randall Vincent  FTD:[id number] DOB: 1941/03/10 DOA: 01/10/2020 PCP: Elspeth Cho., MD    Brief Narrative: Randall Vincent is a 79 y.o. male who complains of left knee pain and swelling for the last few days.  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  He has never had problems with the left knee until recently.  He was seen in our office at [facility] earlier this week complaining of pain and swelling.  He was scheduled for a bone scan to evaluate him for prosthetic loosening versus infection by Dr. Odis Luster.  However he developed GI bleeding in the last day or so and has presented to the emergency room for this reason primarily.  Hemoglobin is down to 7.2 from a high of 13.5 earlier this year.  White blood count is 18,100.  Temperature is about 100 F.  The left knee pain and swelling was noted as well as well as a slightly elevated white count and mild fever.  He had cardioversion in June and July and has remained in sinus rhythm he says.  He was switched from Eliquis to Pradaxa at that time and took his last Pradaxa dose yesterday.  He has not had any recent infections that he knows of.  He denies feeling any fever but has had a chill.  His medical history is complicated by cardiac problems with a history of coronary bypass graft: Surgery cardiac stent placement diabetes hyperlipidemia and hypertension.  Medications included albuterol aspirin Lipitor Pradaxa 150 mg twice daily Flonase Metformin Altase semaglutide Aldactone Ultram as needed and vitamin B-12.   Assessment & Plan:   Principal Problem:   Melena Active Problems:   CAD, multiple vessel   S/P CABG x 2   Atrial fibrillation status post cardioversion 10/22/19 [facility])   Essential hypertension   DM (diabetes mellitus), type 2 (HCC)   Symptomatic anemia   Acute blood loss anemia    Suspect Septic arthritis (HCC)   Chronic anticoagulation   History of bilateral knee replacement   Chronic kidney disease, stage 3a   Acute on chronic congestive heart failure (HCC)   Elevated troponin    Acute blood loss anemia secondary to upper GI bleed/melena in the setting of chronic anticoagulation with Eliquis -Stop pradaxa. -IV Protonix bolus and infusion -Serial H&H and transfuse if necessary -Monitor closely.  Patient currently hemodynamically stable -GI consult -N.p.o. after midnight for possible endoscopy in the a.m. --EGD expected for Tuesday. --Cont iv ppi therapy and clear liquid diet.  -PT is s/p egd today and has tolerated procedure well, with plan for colonoscopy tomorrow . pradaxa cont to be held.    Suspect Septic arthritis (HCC)    History of bilateral knee replacement -Patient with history of knee replacement several years ago presenting with a several week history of worsening pain and swelling of the right knee -Bone scan was ordered from the outpatient uncertain if performed -We will get baseline diagnostic imaging with x-ray -Orthopedic consult to direct further imaging studies to evaluate for septic joint and to arrange for arthrocentesis -Empiric vancomycin and Zosyn. --per ortho pt to have knee sx from hardware removal by Monday. -C/S pending and cont iv abx. ortho on board with plan for sx for left knee.   Mild acute on chronic congestive heart failure, unspecified (HCC)   Symptomatic anemia -Patient with  mild shortness of breath above baseline, but pulmonary vascular congestion on chest x-ray, BNP elevated at 400 -Suspect triggered by acute blood loss anemia related to GI bleed above -Echocardiogram in the a.m. no prior echoes found on chart review -Holding beta-blocker and ACE inhibitor in the setting of acute GI bleed and concern for hypotension -Low-dose Lasix only for overtly symptomatic dyspnea  -Stable and no chest pain or palpitation of  sob.     Elevated troponin   CAD, multiple vessel   S/P CABG x 2 -Troponin 37>>> 42, suspect secondary to demand ischemia from above -Continue to monitor -Holding aspirin and metoprolol for now based on above acute issues -Might benefit from cardiology consult during hospitalization. -Mildly elevated troponins to renal failure.  Continue to monitor for chest pain or clinical symptoms. -Home regimen statin continued, antiplatelet therapy with aspirin held, patient's home regimen with diltiazem held as I suspect patient's blood pressure may drop further. Patient's home regimen with Aldactone is also held, diltiazem was 240 at home we have changed it to every 8 hour regimen with parameters. --suspect mild elevation from renal failure.     Atrial fibrillation status post cardioversion 10/22/19 [facility]) -Patient currently in sinus rhythm -Recently switched from Pradaxa to Eliquis on 12/18/2019 due to concern for renal function -Pradaxa discontinued for now due to GI bleeding.    Essential hypertension -Antihypertensives as BP with tolerate. -home meds held due renal failure and possible hypotension. -diltiazem changed to po q8 with parameters.  Pressure is currently well controlled with current regimen of diltiazem.      DM (diabetes mellitus), type 2 (HCC) -Fingersticks every 4 while n.p.o. with sliding scale coverage. -Hypoglycemia protocol. -A1c is 6.7 and under good control. -Regimen is with sliding scale coverage only Metformin is discontinued due to recent renal insufficiency.  Acute kidney injury: -GFR at baseline -As mentioned above anticoagulant was switched from Pradaxa to Eliquis for preservation of renal function. -Avoid any nephrotoxic agents and contrast, renally dose meds as needed. - this is an aki creatinine trends as follows and they are improving. 8/20: 1.77 8/21: 1.79 8/22: 1.71 8/23: 1.47 8/24: 1.55   DVT prophylaxis: SCDs Code Status: full code  Family  Communication:  none  Disposition Plan: Back to previous home environment Consults called: GI, orthopedics  Subjective: Pt is alert oriented and here for GIB and suspected Left knee infection. Orthopedic and GI. Pt is stable clinically and denies any chest pain or sob. H/H is 7.2 and pt is receiving 2 units prbc and repeat is 8.6.  Pt seen today for f/u and is stable, denies any complaints. Pt is alert and is due to have egd today. Denies any complaints and revived his labs and h./h with him. egd shows  - Salmon-colored mucosa suspicious for short-segment Barrett's esophagus. Biopsy is contraindicated. - Erosive gastropathy with no bleeding and no stigmata of recent bleeding. - Normal examined duodenum. - No specimens collected. - The findings on today's exam do not reveal the cause of his anemia and melena. With recommendation to have left knee addressed and then to have a colonoscopy.  Gm positive cocci  in joint fluid and ID /c/s is pending and abx to be continued.   Patient seen today he is alert awake oriented with family at bedside.  Patient was hesitant about having colonoscopy and discussed with him to discuss with GI and get more information before you side against it.  Blood pressure today has been elevated.   Objective: Vitals:  01/14/20 0509 01/14/20 0826 01/14/20 1206 01/14/20 1641  BP: (!) 156/63 (!) 163/69 (!) 151/58 (!) 182/68  Pulse: 74 75 71 86  Resp:  19 18 20   Temp: 98 F (36.7 C) 98 F (36.7 C) 98 F (36.7 C) 98.3 F (36.8 C)  TempSrc: Oral Oral Oral Oral  SpO2: 96% 98% 97% 97%  Weight: 122 kg     Height:        Intake/Output Summary (Last 24 hours) at 01/14/2020 1648 Last data filed at 01/14/2020 1641 Gross per 24 hour  Intake 884.31 ml  Output 2225 ml  Net -1340.69 ml   Filed Weights   01/13/20 0514 01/13/20 1326 01/14/20 0509  Weight: 122.4 kg 122 kg 122 kg    Examination: Blood pressure (!) 182/68, pulse 86, temperature 98.3 F (36.8 C),  temperature source Oral, resp. rate 20, height 5\' 8"  (1.727 m), weight 122 kg, SpO2 97 %. General exam: Appears calm and comfortable  Respiratory system: Clear to auscultation. Respiratory effort normal. Cardiovascular system: S1 & S2 heard, RRR. No JVD, murmurs, rubs, gallops or clicks. No pedal edema. Gastrointestinal system: Abdomen is nondistended, soft and nontender. No organomegaly or masses felt. Normal bowel sounds heard. Central nervous system: Alert and oriented. No focal neurological deficits. Extremities: Symmetric 5 x 5 power. Skin: No rashes, lesions or ulcers Psychiatry: Judgement and insight appear normal. Mood & affect appropriate.   Data Reviewed: I have personally reviewed following labs and imaging studies  CBC: Recent Labs  Lab 01/10/20 1837 01/11/20 0859 01/12/20 1522 01/12/20 1947 01/13/20 0148 01/13/20 0444 01/13/20 0917 01/13/20 1457 01/14/20 0524  WBC 18.1*  --  13.6*  --   --  13.1*  --   --  12.1*  NEUTROABS 15.7*  --   --   --   --   --   --   --   --   HGB 8.0*   < > 8.9*  8.6*   < > 8.0* 8.5* 9.0* 9.0* 8.5*  HCT 25.7*   < > 27.1*  27.4*   < > 24.4* 26.0* 28.7* 28.8* 27.3*  MCV 84.0  --  85.6  --   --  83.6  --   --  86.4  PLT 426*  --  352  --   --  336  --   --  352   < > = values in this interval not displayed.   Basic Metabolic Panel: Recent Labs  Lab 01/10/20 1837 01/11/20 0324 01/12/20 0527 01/13/20 0444 01/14/20 0524  NA 136 139 135 133* 136  K 4.5 4.2 4.6 4.7 4.3  CL 104 108 106 107 106  CO2 21* 20* 21* 17* 20*  GLUCOSE 199* 184* 155* 128* 150*  BUN 43* 40* 31* 25* 22  CREATININE 1.77* 1.79* 1.71* 1.47* 1.55*  CALCIUM 8.5* 7.9* 8.3* 8.0* 8.4*  MG  --   --   --  2.0  --    GFR: Estimated Creatinine Clearance: 49.9 mL/min (A) (by C-G formula based on SCr of 1.55 mg/dL (H)).  Coagulation Profile: Recent Labs  Lab 01/10/20 1837 01/11/20 0324  INR 1.9* 1.7*   HbA1C: No results for input(s): HGBA1C in the last 72  hours. CBG: Recent Labs  Lab 01/13/20 2131 01/14/20 0516 01/14/20 0828 01/14/20 1113 01/14/20 1637  GLUCAP 160* 146* 137* 215* 126*   Sepsis Labs: Recent Labs  Lab 01/10/20 1837 01/10/20 2028 01/11/20 0324  PROCALCITON  --   --  0.11  LATICACIDVEN 2.0* 1.3  --     Recent Results (from the past 240 hour(s))  Culture, blood (routine x 2)     Status: None (Preliminary result)   Collection Time: 01/11/20 12:37 AM   Specimen: BLOOD  Result Value Ref Range Status   Specimen Description BLOOD RIGHT ANTECUBITAL  Final   Special Requests   Final    BOTTLES DRAWN AEROBIC AND ANAEROBIC Blood Culture results may not be optimal due to an excessive volume of blood received in culture bottles   Culture   Final    NO GROWTH 3 DAYS Performed at [facility], [address] [place], [place] [address]    Report Status PENDING  Incomplete  Culture, blood (routine x 2)     Status: None (Preliminary result)   Collection Time: 01/11/20 12:37 AM   Specimen: BLOOD  Result Value Ref Range Status   Specimen Description BLOOD LEFT ANTECUBITAL  Final   Special Requests   Final    BOTTLES DRAWN AEROBIC AND ANAEROBIC Blood Culture results may not be optimal due to an excessive volume of blood received in culture bottles   Culture   Final    NO GROWTH 3 DAYS Performed at [facility], [address] [place], [place] [address]    Report Status PENDING  Incomplete  SARS Coronavirus 2 by RT PCR (hospital order, performed in [facility] hospital lab) Nasopharyngeal Nasopharyngeal Swab     Status: None   Collection Time: 01/11/20  6:14 AM   Specimen: Nasopharyngeal Swab  Result Value Ref Range Status   SARS Coronavirus 2 NEGATIVE NEGATIVE Final    Comment: (NOTE) SARS-CoV-2 target nucleic acids are NOT DETECTED.  The SARS-CoV-2 RNA is generally detectable in upper and lower respiratory specimens during the acute phase of infection. The lowest concentration of SARS-CoV-2 viral  copies this assay can detect is 250 copies / mL. A negative result does not preclude SARS-CoV-2 infection and should not be used as the sole basis for treatment or other patient management decisions.  A negative result may occur with improper specimen collection / handling, submission of specimen other than nasopharyngeal swab, presence of viral mutation(s) within the areas targeted by this assay, and inadequate number of viral copies (<250 copies / mL). A negative result must be combined with clinical observations, patient history, and epidemiological information.  Fact Sheet for Patients:   BoilerBrush.com.cy  Fact Sheet for Healthcare Providers: https://pope.com/  This test is not yet approved or  cleared by the [place] FDA and has been authorized for detection and/or diagnosis of SARS-CoV-2 by FDA under an Emergency Use Authorization (EUA).  This EUA will remain in effect (meaning this test can be used) for the duration of the COVID-19 declaration under Section 564(b)(1) of the Act, 21 U.S.C. section 360bbb-3(b)(1), unless the authorization is terminated or revoked sooner.  Performed at [facility], [address] [place], [place] [address]   Culture, body fluid-bottle     Status: Abnormal (Preliminary result)   Collection Time: 01/11/20  6:07 PM   Specimen: Fluid  Result Value Ref Range Status   Specimen Description FLUID KNEE  Final   Special Requests   Final    BOTTLES DRAWN AEROBIC AND ANAEROBIC Blood Culture adequate volume   Gram Stain   Final    GRAM POSITIVE COCCI IN CLUSTERS ANAEROBIC BOTTLE ONLY CRITICAL RESULT CALLED TO, READ BACK BY AND VERIFIED WITH: RN Sheria Lang [id number] FCP    Culture (A)  Final    ENTEROCOCCUS FAECALIS SUSCEPTIBILITIES TO FOLLOW Performed at [facility], [address], [place], [place] [address]    Report Status PENDING  Incomplete  Gram stain     Status:  None   Collection Time: 01/11/20  6:07 PM   Specimen: Fluid  Result Value Ref Range Status   Specimen Description FLUID KNEE  Final   Special Requests NONE  Final   Gram Stain   Final    FEW WBC PRESENT,BOTH PMN AND MONONUCLEAR NO ORGANISMS SEEN Performed at [facility], [address] [place], [place] [address]    Report Status 01/12/2020 FINAL  Final     Radiology Studies: DG Chest 2 View  Result Date: 01/10/2020 CLINICAL DATA:  Weakness EXAM: CHEST - 2 VIEW COMPARISON:  06/26/2019 FINDINGS: Hazy interstitial opacities with a mid to lower lung predominance with some slight vascular cephalization/congestion. No pneumothorax. No effusion. No focal consolidation. Cardiomegaly appearance is similar to priors with evidence of prior sternotomy and CABG and a calcified, tortuous aorta. Cardiomediastinal contours are otherwise unremarkable. No acute osseous or soft tissue abnormality. Degenerative changes are present in the imaged spine and shoulders.  IMPRESSION: Features of CHF/volume overload with cardiomegaly and findings suggesting mild interstitial pulmonary edema. Electronically Signed   By: Kreg Shropshire M.D.   On: 01/10/2020 19:19   DG Knee 1-2 Views Left Result Date: 01/11/2020 CLINICAL DATA:  Bilateral knee replacements.  Pain and swelling. EXAM: LEFT KNEE - 1-2 VIEW COMPARISON:  None. FINDINGS: Left total knee arthroplasty without perihardware lucency. Intermediate sized suprapatellar joint effusion. No evidence of arthropathy or other focal bone abnormality. Soft tissues are unremarkable. IMPRESSION:  1. Intermediate sized suprapatellar joint effusion. 2. Uncomplicated left total knee arthroplasty.  Electronically Signed   By: Deatra Robinson M.D.   On: 01/11/2020 04:34   DG Knee 1-2 Views Right  Result Date: 01/11/2020 CLINICAL DATA:  Knee replacements.  Swelling and pain. EXAM: RIGHT KNEE - 1-2 VIEW COMPARISON:  None. FINDINGS: There is a right total knee arthroplasty. No  perihardware lucency. No evidence of arthropathy or other focal bone abnormality. Soft tissues are unremarkable. 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 Electronically Signed   By: Deatra Robinson M.D.   On: 01/11/2020 04:35   ECHOCARDIOGRAM COMPLETE  Result Date: 01/11/2020    ECHOCARDIOGRAM REPORT   Patient Name:   Randall Vincent Date of Exam: 01/11/2020 Medical Rec #:  [id number]     Height:       70.0 in Accession #:    [id number]    Weight:       240.0 lb Date of Birth:  February 24, 1941      BSA:          2.255 m Patient Age:    79 years      BP:           134/48 mmHg Patient Gender: M             HR:           74 bpm. Exam Location:  [facility] Procedure: 2D Echo, Cardiac Doppler and Color Doppler Indications:     CHF- acute diastolic 428.31  History:         Patient has no prior history of Echocardiogram examinations.                  Arrythmias:Atrial Fibrillation; Risk Factors:Hypertension,  Diabetes and Dyslipidemia.  Sonographer:     Cristela Blue RDCS (AE) Referring Phys:  [id number] Andris Baumann Diagnosing Phys: Lorine Bears MD  Sonographer Comments: No apical window.  IMPRESSIONS   1. Left ventricular ejection fraction, by estimation, is 50 to 55%. The left ventricle has low normal function. Left ventricular endocardial border not optimally defined to evaluate regional wall motion. Left ventricular diastolic function could not be evaluated.   2. Right ventricular systolic function is normal. The right ventricular size is normal. Tricuspid regurgitation signal is inadequate for assessing PA pressure.   3. Left atrial size was mildly dilated.   4. The mitral valve is normal in structure. No evidence of mitral valve regurgitation. No evidence of mitral stenosis.   5. The aortic valve is normal in structure. Aortic valve regurgitation is not visualized. Mild aortic valve sclerosis is present, with no evidence of aortic valve stenosis.   6. Mildly dilated pulmonary  artery.   7. The inferior vena cava is normal in size with greater than 50% respiratory variability, suggesting right atrial pressure of 3 mmHg.  FINDINGS  Left Ventricle: Left ventricular ejection fraction, by estimation, is 50 to 55%. The left ventricle has low normal function. Left ventricular endocardial border not optimally defined to evaluate regional wall motion. The left ventricular internal cavity  size was normal in size. There is borderline left ventricular hypertrophy. Left ventricular diastolic function could not be evaluated. Right Ventricle: The right ventricular size is normal. No increase in right ventricular wall thickness. Right ventricular systolic function is normal. Tricuspid regurgitation signal is inadequate for assessing PA pressure. The tricuspid regurgitant velocity is 2.67 m/s, and with an assumed right atrial pressure of 5 mmHg, the estimated right ventricular systolic pressure is 33.5 mmHg. Left Atrium: Left atrial size was mildly dilated. Right Atrium: Right atrial size was normal in size. Pericardium: There is no evidence of pericardial effusion. Mitral Valve: The mitral valve is normal in structure. Normal mobility of the mitral valve leaflets. Mild mitral annular calcification. No evidence of mitral valve regurgitation. No evidence of mitral valve stenosis. Tricuspid Valve: The tricuspid valve is normal in structure. Tricuspid valve regurgitation is trivial. No evidence of tricuspid stenosis. Aortic Valve: The aortic valve is normal in structure. Aortic valve regurgitation is not visualized. Mild aortic valve sclerosis is present, with no evidence of aortic valve stenosis. Pulmonic Valve: The pulmonic valve was normal in structure. Pulmonic valve regurgitation is trivial. No evidence of pulmonic stenosis. Aorta: The aortic root is normal in size and structure. Pulmonary Artery: The pulmonary artery is mildly dilated. Venous: The inferior vena cava is normal in size with greater  than 50% respiratory variability, suggesting right atrial pressure of 3 mmHg. IAS/Shunts: No atrial level shunt detected by color flow Doppler.  LEFT VENTRICLE PLAX 2D LVIDd:         5.30 cm LVIDs:         3.95 cm LV PW:         0.68 cm LV IVS:        1.77 cm LVOT diam:     2.00 cm LVOT Area:     3.14 cm  LEFT ATRIUM         Index LA diam:    4.70 cm 2.08 cm/m                        PULMONIC VALVE AORTA  PV Vmax:        0.73 m/s Ao Root diam: 3.20 cm PV Peak grad:   2.1 mmHg                       RVOT Peak grad: 7 mmHg  TRICUSPID VALVE TR Peak grad:   28.5 mmHg TR Vmax:        267.00 cm/s  SHUNTS Systemic Diam: 2.00 cm Lorine Bears MD Electronically signed by Lorine Bears MD Signature Date/Time: 01/11/2020/5:11:53 PM    Final     Scheduled Meds: . sodium chloride   Intravenous Once  . amiodarone  200 mg Oral Daily  . atorvastatin  40 mg Oral Daily  . diltiazem  60 mg Oral Q8H  . insulin aspart  0-15 Units Subcutaneous Q4H  . pantoprazole  40 mg Intravenous Q12H  . polyethylene glycol-electrolytes  4,000 mL Oral Once  . vitamin B-12  1,000 mcg Oral Daily   Continuous Infusions: . sodium chloride Stopped (01/14/20 0154)  . sodium chloride 500 mL (01/14/20 1643)  . piperacillin-tazobactam (ZOSYN)  IV 3.375 g (01/14/20 1644)  . vancomycin 1,500 mg (01/14/20 0917)     LOS: 4 days   Domingo Pulse [facility] Pager [phone] If 7PM-7AM, please contact night-coverage [email or web address] Password [facility] 01/14/2020, 4:48 PM

## 2020-01-14 NOTE — Evaluation (Signed)
Physical Therapy Evaluation Patient Details Name: Randall Vincent MRN: [id number] DOB: 05-Jan-1941 Today's Date: 01/14/2020   History of Present Illness  Randall Vincent is a 79 y.o. male who complains of left knee pain and swelling for the last few days.  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  He has not had problems with the left knee until recently.  He was seen in our office at [facility] earlier this week complaining of pain and swelling.  Clinical Impression  Patient received in bed with OT present. Patient is agreeable to PT/OT assessment. He requests assistance to move B LEs out of bed. Needed mod assist with this. He requires bed to be elevated to assist with sit to stand witch he performed with mod +2 assist. He is able to ambulate 6 feet with RW and min guard of +2 assist. He is fearful of moving, fearful of falling. Patient will continue to benefit from skilled PT while here to improve strength and functional independence for return to prior functional level.     Follow Up Recommendations SNF;Supervision for mobility/OOB    Equipment Recommendations  None recommended by PT    Recommendations for Other Services       Precautions / Restrictions Precautions Precautions: Fall Precaution Comments: High Fall Restrictions Weight Bearing Restrictions: No      Mobility  Bed Mobility Overal bed mobility: Needs Assistance Bed Mobility: Supine to Sit     Supine to sit: Mod assist     General bed mobility comments: MOD A for mgt of BLE, pt directive of care, requests assist for BLE although he could physically move RLE w/o difficulty.  Wants B LEs moved together.  Transfers Overall transfer level: Needs assistance Equipment used: Rolling walker (2 wheeled) Transfers: Sit to/from Stand Sit to Stand: Mod assist;From elevated surface;+2  physical assistance;Min assist         General transfer comment: Pt uncertain and very afraid of falling. Requires encouragement and VCs for hand/foot placement during STS. ultimately requires +2 min/mod a for STS from elevated bed. CGA for functional mobiltiy.  Ambulation/Gait Ambulation/Gait assistance: Min guard;+2 physical assistance Gait Distance (Feet): 6 Feet Assistive device: Rolling walker (2 wheeled) Gait Pattern/deviations: Step-to pattern;Decreased stance time - left;Shuffle Gait velocity: decreased   General Gait Details: patient generally steady with ambulation using RW. Reports knee pain on left during gait.  Stairs            Wheelchair Mobility    Modified Rankin (Stroke Patients Only)       Balance Overall balance assessment: Needs assistance Sitting-balance support: Feet supported Sitting balance-Leahy Scale: Good Sitting balance - Comments: Steady static sitting, reaching within BOS.   Standing balance support: During functional activity;Bilateral upper extremity supported Standing balance-Leahy Scale: Fair Standing balance comment: Requires BUE support to maintain static standing.                             Pertinent Vitals/Pain Pain Assessment: Faces Pain Score: 10-Worst pain ever Pain Location: L knee with movement Pain Descriptors / Indicators: Aching;Sore;Grimacing;Guarding Pain Intervention(s): Monitored during session;Repositioned;Limited activity within patient's tolerance    Home Living Family/patient expects to be discharged to:: Private residence Living Arrangements: Alone Available Help at Discharge: Family;Available PRN/intermittently Type of Home: Other(Comment) (camper that is in his son's yard) Home  Access: Stairs to enter Entrance Stairs-Rails: Right;Left;Can reach both Entrance Stairs-Number of Steps: 5 Home Layout: One level Home Equipment: Walker - 2 wheels;Cane - single point;Adaptive equipment       Prior Function Level of Independence: Independent with assistive device(s)         Comments: Pt reports he was independent with ADL mgt, using a SPC or 2WW for functional mobility. +driving, running errands 1 week prior to admission.     Hand Dominance        Extremity/Trunk Assessment   Upper Extremity Assessment Upper Extremity Assessment: Generalized weakness    Lower Extremity Assessment Lower Extremity Assessment: Generalized weakness    Cervical / Trunk Assessment Cervical / Trunk Assessment: Normal  Communication   Communication: No difficulties  Cognition Arousal/Alertness: Awake/alert Behavior During Therapy: WFL for tasks assessed/performed Overall Cognitive Status: Within Functional Limits for tasks assessed                                        General Comments      Exercises Other Exercises Other Exercises: Pt educated on role of OT in acute setting, safe use of AE/DME for ADL management, and safe transfer technique. Other Exercises: OT/PT facilitate bed/functional mobility and provide education t/o for safety/sequencing.   Assessment/Plan    PT Assessment Patient needs continued PT services  PT Problem List Decreased strength;Decreased mobility;Decreased range of motion;Decreased activity tolerance;Decreased balance;Pain       PT Treatment Interventions DME instruction;Therapeutic activities;Gait training;Therapeutic exercise;Patient/family education;Stair training;Balance training;Functional mobility training;Neuromuscular re-education    PT Goals (Current goals can be found in the Care Plan section)  Acute Rehab PT Goals Patient Stated Goal: To go home PT Goal Formulation: With patient Time For Goal Achievement: 01/28/20 Potential to Achieve Goals: Fair    Frequency Min 2X/week   Barriers to discharge Decreased caregiver support;Inaccessible home environment      Co-evaluation PT/OT/SLP Co-Evaluation/Treatment:  Yes Reason for Co-Treatment: For patient/therapist safety;To address functional/ADL transfers PT goals addressed during session: Mobility/safety with mobility;Balance;Proper use of DME         AM-PAC PT "6 Clicks" Mobility  Outcome Measure Help needed turning from your back to your side while in a flat bed without using bedrails?: A Lot Help needed moving from lying on your back to sitting on the side of a flat bed without using bedrails?: A Lot Help needed moving to and from a bed to a chair (including a wheelchair)?: A Lot Help needed standing up from a chair using your arms (e.g., wheelchair or bedside chair)?: A Lot Help needed to walk in hospital room?: A Little Help needed climbing 3-5 steps with a railing? : A Lot 6 Click Score: 13    End of Session Equipment Utilized During Treatment: Gait belt Activity Tolerance: Patient limited by pain Patient left: in chair;with chair alarm set;with call bell/phone within reach Nurse Communication: Mobility status PT Visit Diagnosis: Muscle weakness (generalized) (M62.81);Other abnormalities of gait and mobility (R26.89);Difficulty in walking, not elsewhere classified (R26.2);Pain Pain - Right/Left: Left Pain - part of body: Knee    Time: 1353-1416 PT Time Calculation (min) (ACUTE ONLY): 23 min   Charges:   PT Evaluation $PT Eval Moderate Complexity: 1 Mod PT Treatments $Gait Training: 8-22 mins        Deissy Guilbert, PT, GCS 01/14/20,3:37 PM

## 2020-01-14 NOTE — Evaluation (Addendum)
Occupational Therapy Evaluation Patient Details Name: Randall Vincent MRN: [id number] DOB: 07/29/1940 Today's Date: 01/14/2020    History of Present Illness Randall Vincent is a 79 y.o. male who complains of left knee pain and swelling for the last few days.  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  He has not had problems with the left knee until recently.  He was seen in our office at [facility] earlier this week complaining of pain and swelling.   Clinical Impression   Randall Vincent was seen for OT/PT co-evaluation this date. Prior to hospital admission, pt was generally independent for ADL management. He reports using a SPC for household mobility as well as a RW for community distances. Pt lives alone in a camper behind his son's house. He reports his son and DIL are available to assist him, but only PRN as they work during the day.  Currently pt demonstrates impairments as described below (See OT problem list) which functionally limit his/her ability to perform ADL/self-care tasks. Pt currently requires MOD A for LB ADL management, as well as MIN/MOD A +2 for STS transfers. Once standing he performs functional mobility with CGA for safety.  Pt would benefit from skilled OT services to address noted impairments and functional limitations (see below for any additional details) in order to maximize safety and independence while minimizing falls risk and caregiver burden. Upon hospital discharge, recommend STR to maximize pt safety and return to PLOF.      Follow Up Recommendations  SNF;Supervision/Assistance - 24 hour    Equipment Recommendations  Other (comment) (TBD)    Recommendations for Other Services       Precautions / Restrictions Precautions Precautions: Fall Precaution Comments: High Fall Restrictions Weight Bearing Restrictions: No       Mobility Bed Mobility Overal bed mobility: Needs Assistance Bed Mobility: Supine to Sit     Supine to sit: Mod assist     General bed mobility comments: MOD A for mgt of BLE, pt directive of care, requests assist for BLE although he could physically move RLE w/o difficulty.  Transfers Overall transfer level: Needs assistance   Transfers: Sit to/from Stand Sit to Stand: Mod assist;Min assist;+2 physical assistance         General transfer comment: Pt uncertain and very afraid of falling. Requires encouragement and VCs for hand/foot placement during STS. ultimately requires +2 min/mod a for STS from elevated bed. CGA for functional mobiltiy.    Balance Overall balance assessment: Needs assistance Sitting-balance support: Feet supported;No upper extremity supported Sitting balance-Leahy Scale: Good Sitting balance - Comments: Steady static sitting, reaching within BOS.   Standing balance support: During functional activity;Bilateral upper extremity supported Standing balance-Leahy Scale: Fair Standing balance comment: Requires BUE support to maintain static standing.                           ADL either performed or assessed with clinical judgement   ADL Overall ADL's : Needs assistance/impaired                                       General ADL Comments: Pt functionally limited by increased pain and decreased AROM in L knee as well as generalized weakness. MIN-MOD A +  2 for functional mobility. MOD A for LB dressing in seated position. Set-up/Sup for seated UB ADL management.     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     Perception     Praxis      Pertinent Vitals/Pain Pain Assessment: 0-10 Pain Score: 10-Worst pain ever Pain Location: L knee with movement Pain Descriptors /  Indicators: Aching;Sore;Grimacing;Guarding Pain Intervention(s): Repositioned;Limited activity within patient's tolerance;Monitored during session     Hand Dominance     Extremity/Trunk Assessment Upper Extremity Assessment Upper Extremity Assessment: Generalized weakness   Lower Extremity Assessment Lower Extremity Assessment: Generalized weakness       Communication Communication Communication: No difficulties   Cognition Arousal/Alertness: Awake/alert Behavior During Therapy: WFL for tasks assessed/performed Overall Cognitive Status: Within Functional Limits for tasks assessed                                     General Comments       Exercises Other Exercises Other Exercises: Pt educated on role of OT in acute setting, safe use of AE/DME for ADL management, and safe transfer technique. Other Exercises: OT/PT facilitate bed/functional mobility and provide education t/o for safety/sequencing.   Shoulder Instructions      Home Living Family/patient expects to be discharged to:: Private residence Counselling psychologist) Living Arrangements: Alone   Type of Home: Other(Comment) Counselling psychologist) Home Access: Stairs to enter Entrance Stairs-Number of Steps: 5 Entrance Stairs-Rails: Right;Left;Can reach both Home Layout: One level     Bathroom Shower/Tub: Producer, television/film/video: Handicapped height Bathroom Accessibility: No   Home Equipment: Environmental consultant - 2 wheels;Cane - single point;Adaptive equipment Adaptive Equipment: Reacher        Prior Functioning/Environment Level of Independence: Independent with assistive device(s)        Comments: Pt reports he was independent with ADL mgt, using a SPC or 2WW for functional mobility. +driving, running errands 1 week prior to admission.        OT Problem List: Decreased strength;Decreased coordination;Pain;Decreased range of motion;Decreased activity tolerance;Decreased safety awareness;Impaired balance (sitting  and/or standing);Decreased knowledge of use of DME or AE      OT Treatment/Interventions: Self-care/ADL training;Therapeutic exercise;Therapeutic activities;DME and/or AE instruction;Patient/family education;Balance training;Energy conservation    OT Goals(Current goals can be found in the care plan section) Acute Rehab OT Goals Patient Stated Goal: To go home OT Goal Formulation: With patient Time For Goal Achievement: 01/28/20 ADL Goals Pt Will Perform Grooming: sitting;with modified independence Pt Will Perform Lower Body Dressing: with set-up;with supervision;sit to/from stand;with adaptive equipment (c LRAD PRN for improved safety and functional independence.) Pt Will Transfer to Toilet: ambulating;bedside commode;with supervision;with set-up (c LRAD PRN for improved safety and functional independence.) Pt Will Perform Toileting - Clothing Manipulation and hygiene: sit to/from stand;with supervision;with set-up;with adaptive equipment (c LRAD PRN for improved safety and functional independence.)  OT Frequency: Min 2X/week   Barriers to D/C: Inaccessible home environment;Decreased caregiver support          Co-evaluation              AM-PAC OT "6 Clicks" Daily Activity     Outcome Measure Help from another person eating meals?: None Help from another person taking care of personal grooming?: A Little Help from another person toileting,  which includes using toliet, bedpan, or urinal?: A Lot Help from another person bathing (including washing, rinsing, drying)?: A Lot Help from another person to put on and taking off regular upper body clothing?: A Little Help from another person to put on and taking off regular lower body clothing?: A Lot 6 Click Score: 16   End of Session Equipment Utilized During Treatment: Gait belt;Rolling walker Nurse Communication: Mobility status  Activity Tolerance: Patient tolerated treatment well Patient left: in chair;with call bell/phone  within reach;with chair alarm set  OT Visit Diagnosis: Other abnormalities of gait and mobility (R26.89);Muscle weakness (generalized) (M62.81);Pain Pain - Right/Left: Left Pain - part of body: Knee                Time: 1339-1415 OT Time Calculation (min): 36 min Charges:  OT General Charges $OT Visit: 1 Visit OT Evaluation $OT Eval Moderate Complexity: 1 Mod OT Treatments $Self Care/Home Management : 8-22 mins  Rockney Ghee, M.S., OTR/L Ascom: [phone] 01/14/20, 3:28 PM

## 2020-01-15 ENCOUNTER — Encounter: Payer: Self-pay | Admitting: Gastroenterology

## 2020-01-15 ENCOUNTER — Inpatient Hospital Stay: Payer: Medicare Other | Admitting: Anesthesiology

## 2020-01-15 ENCOUNTER — Encounter
Admission: EM | Disposition: A | Discharge status: Skilled Nursing Facility | Payer: Self-pay | Source: Home / Self Care | Attending: Internal Medicine

## 2020-01-15 DIAGNOSIS — D62 Acute posthemorrhagic anemia: Secondary | ICD-10-CM

## 2020-01-15 DIAGNOSIS — Z980 Intestinal bypass and anastomosis status: Secondary | ICD-10-CM

## 2020-01-15 DIAGNOSIS — I4891 Unspecified atrial fibrillation: Secondary | ICD-10-CM

## 2020-01-15 DIAGNOSIS — M009 Pyogenic arthritis, unspecified: Secondary | ICD-10-CM

## 2020-01-15 HISTORY — PX: COLONOSCOPY: SHX5424

## 2020-01-15 LAB — BASIC METABOLIC PANEL
Anion gap: 7 (ref 5–15)
BUN: 18 mg/dL (ref 8–23)
CO2: 21 mmol/L — ABNORMAL LOW (ref 22–32)
Calcium: 7.7 mg/dL — ABNORMAL LOW (ref 8.9–10.3)
Chloride: 109 mmol/L (ref 98–111)
Creatinine, Ser: 1.31 mg/dL — ABNORMAL HIGH (ref 0.61–1.24)
GFR calc Af Amer: >60 mL/min (ref >60)
GFR calc non Af Amer: 52 mL/min — ABNORMAL LOW (ref >60)
Glucose, Bld: 119 mg/dL — ABNORMAL HIGH (ref 70–99)
Potassium: 4.2 mmol/L (ref 3.5–5.1)
Sodium: 137 mmol/L (ref 135–145)

## 2020-01-15 LAB — CBC
HCT: 25.2 % — ABNORMAL LOW (ref 39.0–52.0)
Hemoglobin: 7.9 g/dL — ABNORMAL LOW (ref 13.0–17.0)
MCH: 27 pg (ref 26.0–34.0)
MCHC: 31.3 g/dL (ref 30.0–36.0)
MCV: 86 fL (ref 80.0–100.0)
Platelets: 344 10*3/uL (ref 150–400)
RBC: 2.93 MIL/uL — ABNORMAL LOW (ref 4.22–5.81)
RDW: 17.1 % — ABNORMAL HIGH (ref 11.5–15.5)
WBC: 11.4 10*3/uL — ABNORMAL HIGH (ref 4.0–10.5)
nRBC: 0 % (ref 0.0–0.2)

## 2020-01-15 LAB — CULTURE, BODY FLUID W GRAM STAIN -BOTTLE: Special Requests: ADEQUATE

## 2020-01-15 LAB — GLUCOSE, CAPILLARY
Glucose-Capillary: 112 mg/dL — ABNORMAL HIGH (ref 70–99)
Glucose-Capillary: 117 mg/dL — ABNORMAL HIGH (ref 70–99)
Glucose-Capillary: 122 mg/dL — ABNORMAL HIGH (ref 70–99)
Glucose-Capillary: 140 mg/dL — ABNORMAL HIGH (ref 70–99)
Glucose-Capillary: 218 mg/dL — ABNORMAL HIGH (ref 70–99)

## 2020-01-15 LAB — HEMOGLOBIN AND HEMATOCRIT, BLOOD
HCT: 29.7 % — ABNORMAL LOW (ref 39.0–52.0)
Hemoglobin: 9.2 g/dL — ABNORMAL LOW (ref 13.0–17.0)

## 2020-01-15 SURGERY — COLONOSCOPY
Anesthesia: General

## 2020-01-15 MED ORDER — EPHEDRINE SULFATE 50 MG/ML IJ SOLN
INTRAMUSCULAR | Status: DC | PRN
  Administered 2020-01-15 (×2): 5 mg via INTRAVENOUS

## 2020-01-15 MED ORDER — PEG 3350-KCL-NA BICARB-NACL 420 G PO SOLR
4000.0000 mL | Freq: Once | ORAL | Status: AC
  Administered 2020-01-15: 4000 mL via ORAL
  Filled 2020-01-15: qty 4000

## 2020-01-15 MED ORDER — SODIUM CHLORIDE 0.9 % IV SOLN: INTRAVENOUS | Status: DC

## 2020-01-15 MED ORDER — LIDOCAINE HCL (PF) 2 % IJ SOLN
INTRAMUSCULAR | Status: AC
  Filled 2020-01-15: qty 5

## 2020-01-15 MED ORDER — LIDOCAINE HCL (CARDIAC) PF 100 MG/5ML IV SOSY
PREFILLED_SYRINGE | INTRAVENOUS | Status: DC | PRN
  Administered 2020-01-15: 50 mg via INTRAVENOUS

## 2020-01-15 MED ORDER — BISACODYL 5 MG PO TBEC
10.0000 mg | DELAYED_RELEASE_TABLET | Freq: Once | ORAL | Status: AC
  Administered 2020-01-15: 10 mg via ORAL
  Filled 2020-01-15 (×2): qty 2

## 2020-01-15 MED ORDER — EPHEDRINE 5 MG/ML INJ
INTRAVENOUS | Status: AC
  Filled 2020-01-15: qty 10

## 2020-01-15 MED ORDER — PROPOFOL 500 MG/50ML IV EMUL
INTRAVENOUS | Status: DC | PRN
  Administered 2020-01-15: 120 ug/kg/min via INTRAVENOUS

## 2020-01-15 MED ORDER — PROPOFOL 500 MG/50ML IV EMUL
INTRAVENOUS | Status: AC
  Filled 2020-01-15: qty 50

## 2020-01-15 MED ORDER — PROPOFOL 10 MG/ML IV BOLUS
INTRAVENOUS | Status: DC | PRN
  Administered 2020-01-15: 50 mg via INTRAVENOUS

## 2020-01-15 NOTE — Anesthesia Postprocedure Evaluation (Signed)
Anesthesia Post Note  Patient: Randall Vincent  Procedure(s) Performed: COLONOSCOPY (N/A )  Patient location during evaluation: Endoscopy Anesthesia Type: General Level of consciousness: awake and alert Pain management: pain level controlled Vital Signs Assessment: post-procedure vital signs reviewed and stable Respiratory status: spontaneous breathing, nonlabored ventilation, respiratory function stable and patient connected to nasal cannula oxygen Cardiovascular status: blood pressure returned to baseline and stable Postop Assessment: no apparent nausea or vomiting Anesthetic complications: no   No complications documented.   Last Vitals:  Vitals:   01/15/20 0840 01/15/20 0850  BP: (!) 116/53 (!) 133/52  Pulse: 70 72  Resp: (!) 27 (!) 23  Temp:    SpO2: 99% 100%    Last Pain:  Vitals:   01/15/20 0750  TempSrc: Temporal  PainSc:                  Corinda Gubler

## 2020-01-15 NOTE — Transfer of Care (Signed)
Immediate Anesthesia Transfer of Care Note  Patient: Randall Vincent  Procedure(s) Performed: COLONOSCOPY (N/A )  Patient Location: Endoscopy Unit  Anesthesia Type:General  Level of Consciousness: awake, alert  and oriented  Airway & Oxygen Therapy: Patient Spontanous Breathing and Patient connected to face mask oxygen  Post-op Assessment: Report given to RN and Post -op Vital signs reviewed and stable  Post vital signs: Reviewed and stable  Last Vitals:  Vitals Value Taken Time  BP 116/53 01/15/20 0841  Temp    Pulse 69 01/15/20 0842  Resp 21 01/15/20 0842  SpO2 100 % 01/15/20 0842  Vitals shown include unvalidated device data.  Last Pain:  Vitals:   01/15/20 0750  TempSrc: Temporal  PainSc:       Patients Stated Pain Goal: 2 (01/13/20 2140)  Complications: No complications documented.

## 2020-01-15 NOTE — Progress Notes (Signed)
Subjective:  Patient reports pain as moderate.  Only pain with movement of left knee.  Objective:   VITALS:   Vitals:   01/14/20 1206 01/14/20 1641 01/14/20 2011 01/15/20 0400  BP: (!) 151/58 (!) 182/68 (!) 147/57 (!) 134/49  Pulse: 71 86 73 69  Resp: 18 20 18 18   Temp: 98 F (36.7 C) 98.3 F (36.8 C) 98.4 F (36.9 C) 98.4 F (36.9 C)  TempSrc: Oral Oral Oral Oral  SpO2: 97% 97% 98% 94%  Weight:      Height:        PHYSICAL EXAM:  Neurologically intact Dorsiflexion/Plantar flexion intact No cellulitis present Compartment soft  LABS  Results for orders placed or performed during the hospital encounter of 01/10/20 (from the past 24 hour(s))  Glucose, capillary     Status: Abnormal   Collection Time: 01/14/20  8:28 AM  Result Value Ref Range   Glucose-Capillary 137 (H) 70 - 99 mg/dL  Glucose, capillary     Status: Abnormal   Collection Time: 01/14/20 11:13 AM  Result Value Ref Range   Glucose-Capillary 215 (H) 70 - 99 mg/dL  Glucose, capillary     Status: Abnormal   Collection Time: 01/14/20  4:37 PM  Result Value Ref Range   Glucose-Capillary 126 (H) 70 - 99 mg/dL  Glucose, capillary     Status: Abnormal   Collection Time: 01/14/20  8:02 PM  Result Value Ref Range   Glucose-Capillary 137 (H) 70 - 99 mg/dL  Glucose, capillary     Status: Abnormal   Collection Time: 01/15/20 12:12 AM  Result Value Ref Range   Glucose-Capillary 140 (H) 70 - 99 mg/dL  Basic metabolic panel     Status: Abnormal   Collection Time: 01/15/20  4:19 AM  Result Value Ref Range   Sodium 137 135 - 145 mmol/L   Potassium 4.2 3.5 - 5.1 mmol/L   Chloride 109 98 - 111 mmol/L   CO2 21 (L) 22 - 32 mmol/L   Glucose, Bld 119 (H) 70 - 99 mg/dL   BUN 18 8 - 23 mg/dL   Creatinine, Ser 01/17/20 (H) 0.61 - 1.24 mg/dL   Calcium 7.7 (L) 8.9 - 10.3 mg/dL   GFR calc non Af Amer 52 (L) >60 mL/min   GFR calc Af Amer >60 >60 mL/min   Anion gap 7 5 - 15  Glucose, capillary     Status: Abnormal    Collection Time: 01/15/20  4:50 AM  Result Value Ref Range   Glucose-Capillary 117 (H) 70 - 99 mg/dL    No results found.  Assessment/Plan: 2 Days Post-Op   Principal Problem:   Melena Active Problems:   CAD, multiple vessel   S/P CABG x 2   Atrial fibrillation status post cardioversion 10/22/19 [facility])   Essential hypertension   DM (diabetes mellitus), type 2 (HCC)   Symptomatic anemia   Acute blood loss anemia   Suspect Septic arthritis (HCC)   Chronic anticoagulation   History of bilateral knee replacement   Chronic kidney disease, stage 3a   Acute on chronic congestive heart failure (HCC)   Elevated troponin   Up with therapy   Plan on removal of left total knee and placement of antibiotic spacer on Thursday. Medical optimization prior to surgery. ID consult regarding antibiotic choice and duration.  The diagnosis, risks, benefits and alternatives to treatment are all discussed in detail with the patient. Risks include but are not limited to bleeding, infection, deep vein thrombosis,  pulmonary embolism, nerve or vascular injury, non-union, repeat operation, persistent pain, weakness, stiffness and death. He understands and is eager to proceed.    Altamese Cabal , PA-C 01/15/2020, 6:57 AM

## 2020-01-15 NOTE — Op Note (Signed)
[facility] Patient Name: Randall Vincent Procedure Date: 01/15/2020 8:05 AM MRN: [id number] Account #: [id number] Date of Birth: 10-23-1940 Admit Type: Outpatient Age: 79 Room: [facility] ENDO ROOM 2 Gender: Male Note Status: Finalized Procedure:             Colonoscopy Indications:           Melena, Acute post hemorrhagic anemia Providers:             Anhad Sheeley B. Bonna Gains MD, MD Medicines:             Monitored Anesthesia Care Complications:         No immediate complications. Procedure:             Pre-Anesthesia Assessment:                        - Prior to the procedure, a History and Physical was                         performed, and patient medications, allergies and                         sensitivities were reviewed. The patient's tolerance                         of previous anesthesia was reviewed.                        - The risks and benefits of the procedure and the                         sedation options and risks were discussed with the                         patient. All questions were answered and informed                         consent was obtained.                        - Patient identification and proposed procedure were                         verified prior to the procedure by the physician, the                         nurse, the anesthesiologist, the anesthetist and the                         technician. The procedure was verified in the                         pre-procedure area in the procedure room in the                         endoscopy suite.                        - Prophylactic Antibiotics: The patient does not  require prophylactic antibiotics.                        - ASA Grade Assessment: III - A patient with severe                         systemic disease.                        - After reviewing the risks and benefits, the patient                         was deemed in satisfactory  condition to undergo the                         procedure.                        - Monitored anesthesia care was determined to be                         medically necessary for this procedure based on review                         of the patient's medical history, medications, and                         prior anesthesia history.                        - The anesthesia plan was to use monitored anesthesia                         care (MAC).                        After obtaining informed consent, the colonoscope was                         passed under direct vision. Throughout the procedure,                         the patient's blood pressure, pulse, and oxygen                         saturations were monitored continuously. The                         Colonoscope was introduced through the anus with the                         intention of advancing to the cecum. The scope was                         advanced to the descending colon before the procedure                         was aborted. Medications were given. The colonoscopy  was performed with ease. The patient tolerated the                         procedure well. The quality of the bowel preparation                         was poor. Findings:      The perianal and digital rectal examinations were normal.      There was evidence of a prior surgical anastomosis in the sigmoid colon.       This was patent and was characterized by healthy appearing mucosa. The       anastomosis was traversed.      A large amount of solid stool was found in the rectum, in the descending       colon and at the anastomosis, interfering with visualization. Impression:            - Preparation of the colon was poor.                        - Patent surgical anastomosis, characterized by                         healthy appearing mucosa.                        - Stool in the rectum, in the descending colon and at                          the colonic anastomosis.                        - No specimens collected. Recommendation:        - Repeat colonoscopy at the next available appointment                         because the bowel preparation was poor.                        - Continue Serial CBCs and transfuse PRN                        - Continue present medications.                        - The findings and recommendations were discussed with                         the patient. Procedure Code(s):     --- Professional ---                        [id number], 32, Colonoscopy, flexible; diagnostic,                         including collection of specimen(s) by brushing or                         washing, when performed (separate procedure) Diagnosis Code(s):     --- Professional ---  Z98.0, Intestinal bypass and anastomosis status                        K92.1, Melena (includes Hematochezia)                        D62, Acute posthemorrhagic anemia CPT copyright 2019 American Medical Association. All rights reserved. The codes documented in this report are preliminary and upon coder review may  be revised to meet current compliance requirements.  Vonda Antigua, MD Margretta Sidle B. Bonna Gains MD, MD 01/15/2020 8:42:52 AM This report has been signed electronically. Number of Addenda: 0 Note Initiated On: 01/15/2020 8:05 AM Total Procedure Duration: 0 hours 13 minutes 30 seconds  Estimated Blood Loss:  Estimated blood loss: none.      [facility]

## 2020-01-15 NOTE — Anesthesia Preprocedure Evaluation (Signed)
Anesthesia Evaluation  Patient identified by MRN, date of birth, ID band Patient awake    Reviewed: Allergy & Precautions, H&P , NPO status , Patient's Chart, lab work & pertinent test results, reviewed documented beta blocker date and time   History of Anesthesia Complications Negative for: history of anesthetic complications  Airway Mallampati: I  TM Distance: >3 FB Neck ROM: full    Dental  (+) Edentulous Upper, Edentulous Lower   Pulmonary neg pulmonary ROS, former smoker,  Patient has new onset cough in past 2 days. Patient vaccinated for covid, negative test. Lungs CTAB.    Pulmonary exam normal breath sounds clear to auscultation       Cardiovascular Exercise Tolerance: Poor hypertension, On Medications + angina with exertion + CAD, + Cardiac Stents, + CABG and +CHF  Normal cardiovascular exam+ dysrhythmias Atrial Fibrillation  Rhythm:regular Rate:Normal - Systolic murmurs afib now in sinus s/p cardioversion  TTE 2021: 1. Left ventricular ejection fraction, by estimation, is 50 to 55%. The  left ventricle has low normal function. Left ventricular endocardial  border not optimally defined to evaluate regional wall motion. Left  ventricular diastolic function could not be  evaluated.  2. Right ventricular systolic function is normal. The right ventricular  size is normal. Tricuspid regurgitation signal is inadequate for assessing  PA pressure.  3. Left atrial size was mildly dilated.  4. The mitral valve is normal in structure. No evidence of mitral valve  regurgitation. No evidence of mitral stenosis.  5. The aortic valve is normal in structure. Aortic valve regurgitation is  not visualized. Mild aortic valve sclerosis is present, with no evidence  of aortic valve stenosis.  6. Mildly dilated pulmonary artery.  7. The inferior vena cava is normal in size with greater than 50%  respiratory variability, suggesting  right atrial pressure of 3 mmHg.    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, to go to OR tomorrow for antibiotic spacer   Abdominal (+) + obese,   Peds  Hematology  (+) Blood dyscrasia, anemia ,   Anesthesia Other Findings Past Medical History: No date: A-fib (HCC) No date: Diabetes (HCC) No date: Hyperlipidemia No date: Hypertension Past Surgical History: No date: BLADDER SURGERY No date: BYPASS GRAFT No date: CARDIOVERSION No date: COLON SURGERY No date: REPLACEMENT TOTAL KNEE BILATERAL No date: STENT PLACEMENT VASCULAR ([facility] HX) BMI    Body Mass Index: 38.73 kg/m     Reproductive/Obstetrics negative OB ROS                             Anesthesia Physical  Anesthesia Plan  ASA: III  Anesthesia Plan: General   Post-op Pain Management:    Induction: Intravenous  PONV Risk Score and Plan: 2 and Ondansetron, Propofol infusion and TIVA  Airway Management Planned: Natural Airway and Simple Face Mask  Additional Equipment: None  Intra-op Plan:   Post-operative Plan:   Informed Consent: I have reviewed the patients History and Physical, chart, labs and discussed the procedure including the risks, benefits and alternatives for the proposed anesthesia with the patient or authorized representative who has indicated his/her understanding and acceptance.     Dental Advisory Given  Plan Discussed with: CRNA  Anesthesia Plan Comments: (Procedure of  an urgent nature for evaluation of melena and anemia. Cannot delay case due to cough. Patient advised of increased risks. Discussed risks of anesthesia with patient, including possibility of difficulty with spontaneous ventilation under anesthesia  necessitating airway intervention, PONV, and rare risks such as cardiac or respiratory or neurological events. Patient understands. Patient informed about increased incidence of above perioperative risk due to high BMI. Patient understands. )        Anesthesia Quick Evaluation

## 2020-01-15 NOTE — Hospital Course (Addendum)
Randall Vincent is a 79 y.o. male who complains of left knee pain and swelling for the last few days.  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  He has never had problems with the left knee until recently.  He was seen in our office at [facility] earlier this week complaining of pain and swelling.  He was scheduled for a bone scan to evaluate him for prosthetic loosening versus infection by Dr. Odis Luster.  However he developed GI bleeding in the last day or so and has presented to the emergency room for this reason primarily.  Hemoglobin is down to 7.2 from a high of 13.5 earlier this year.  White blood count is 18,100.  Temperature is about 100 F.  The left knee pain and swelling was noted as well as well as a slightly elevated white count and mild fever.  He had cardioversion in June and July and has remained in sinus rhythm he says.  He was switched from Eliquis to Pradaxa at that time and took his last Pradaxa dose yesterday.

## 2020-01-15 NOTE — Progress Notes (Signed)
[name], MD [address], [address], [place], [place], [address] [address], [place], [place], [address] Phone: [phone]  Fax: [phone]   Subjective: Patient reports completing his entire prep.  No bloody output or melena.  Reports clear bowel movements this morning   Objective: Exam: Vital signs in last 24 hours: Vitals:   01/14/20 1641 01/14/20 2011 01/15/20 0400 01/15/20 0750  BP: (!) 182/68 (!) 147/57 (!) 134/49 (!) 149/62  Pulse: 86 73 69 75  Resp: 20 18 18 20   Temp: 98.3 F (36.8 C) 98.4 F (36.9 C) 98.4 F (36.9 C) (!) 97.4 F (36.3 C)  TempSrc: Oral Oral Oral Temporal  SpO2: 97% 98% 94% 100%  Weight:      Height:       Weight change:   Intake/Output Summary (Last 24 hours) at 01/15/2020 0813 Last data filed at 01/15/2020 01/17/2020 Gross per 24 hour  Intake 1401.56 ml  Output 1525 ml  Net -123.44 ml    General: No acute distress, AAO x3 Abd: Soft, NT/ND, No HSM Skin: Warm, no rashes Neck: Supple, Trachea midline   Lab Results: Lab Results  Component Value Date   WBC 12.1 (H) 01/14/2020   HGB 8.5 (L) 01/14/2020   HCT 27.3 (L) 01/14/2020   MCV 86.4 01/14/2020   PLT 352 01/14/2020   Micro Results: Recent Results (from the past 240 hour(s))  Culture, blood (routine x 2)     Status: None (Preliminary result)   Collection Time: 01/11/20 12:37 AM   Specimen: BLOOD  Result Value Ref Range Status   Specimen Description BLOOD RIGHT ANTECUBITAL  Final   Special Requests   Final    BOTTLES DRAWN AEROBIC AND ANAEROBIC Blood Culture results may not be optimal due to an excessive volume of blood received in culture bottles   Culture   Final    NO GROWTH 4 DAYS Performed at [facility], [address] [place], [place]    Report Status PENDING  Incomplete  Culture, blood (routine x 2)     Status: None (Preliminary result)   Collection Time: 01/11/20 12:37 AM   Specimen: BLOOD  Result Value Ref Range Status     Specimen Description BLOOD LEFT ANTECUBITAL  Final   Special Requests   Final    BOTTLES DRAWN AEROBIC AND ANAEROBIC Blood Culture results may not be optimal due to an excessive volume of blood received in culture bottles   Culture   Final    NO GROWTH 4 DAYS Performed at [facility], [address] [place], [place]    Report Status PENDING  Incomplete  SARS Coronavirus 2 by RT PCR (hospital order, performed in [facility] hospital lab) Nasopharyngeal Nasopharyngeal Swab     Status: None   Collection Time: 01/11/20  6:14 AM   Specimen: Nasopharyngeal Swab  Result Value Ref Range Status   SARS Coronavirus 2 NEGATIVE NEGATIVE Final    Comment: (NOTE) SARS-CoV-2 target nucleic acids are NOT DETECTED.  The SARS-CoV-2 RNA is generally detectable in upper and lower respiratory specimens during the acute phase of infection. The lowest concentration of SARS-CoV-2 viral copies this assay can detect is 250 copies / mL. A negative result does not preclude SARS-CoV-2 infection and should not be used as the sole basis for treatment or other patient management decisions.  A negative result may occur with improper specimen collection / handling, submission of specimen other than nasopharyngeal swab, presence of viral mutation(s)  within the areas targeted by this assay, and inadequate number of viral copies (<250 copies / mL). A negative result must be combined with clinical observations, patient history, and epidemiological information.  Fact Sheet for Patients:   BoilerBrush.com.cy  Fact Sheet for Healthcare Providers: https://pope.com/  This test is not yet approved or  cleared by the [place] FDA and has been authorized for detection and/or diagnosis of SARS-CoV-2 by FDA under an Emergency Use Authorization (EUA).  This EUA will remain in effect (meaning this test can be used) for the duration of the COVID-19  declaration under Section 564(b)(1) of the Act, 21 U.S.C. section 360bbb-3(b)(1), unless the authorization is terminated or revoked sooner.  Performed at [facility], [address] [place], [place] [address]   Culture, body fluid-bottle     Status: Abnormal (Preliminary result)   Collection Time: 01/11/20  6:07 PM   Specimen: Fluid  Result Value Ref Range Status   Specimen Description FLUID KNEE  Final   Special Requests   Final    BOTTLES DRAWN AEROBIC AND ANAEROBIC Blood Culture adequate volume   Gram Stain   Final    GRAM POSITIVE COCCI IN CLUSTERS ANAEROBIC BOTTLE ONLY CRITICAL RESULT CALLED TO, READ BACK BY AND VERIFIED WITH: RN Sheria Lang [id number] FCP    Culture (A)  Final    ENTEROCOCCUS FAECALIS SUSCEPTIBILITIES TO FOLLOW Performed at [facility], [address] [place], [place] [address]    Report Status PENDING  Incomplete  Gram stain     Status: None   Collection Time: 01/11/20  6:07 PM   Specimen: Fluid  Result Value Ref Range Status   Specimen Description FLUID KNEE  Final   Special Requests NONE  Final   Gram Stain   Final    FEW WBC PRESENT,BOTH PMN AND MONONUCLEAR NO ORGANISMS SEEN Performed at [facility], [address], [place], [place] [address]    Report Status 01/12/2020 FINAL  Final   Studies/Results: No results found. Medications:  Scheduled Meds: . [MAR Hold] sodium chloride   Intravenous Once  . [MAR Hold] amiodarone  200 mg Oral Daily  . [MAR Hold] atorvastatin  40 mg Oral Daily  . [MAR Hold] diltiazem  60 mg Oral Q8H  . [MAR Hold] insulin aspart  0-15 Units Subcutaneous Q4H  . [MAR Hold] pantoprazole  40 mg Intravenous Q12H  . [MAR Hold] vitamin B-12  1,000 mcg Oral Daily   Continuous Infusions: . sodium chloride 20 mL/hr (01/15/20 0755)  . [MAR Hold] sodium chloride 500 mL (01/14/20 1643)  . [MAR Hold] piperacillin-tazobactam (ZOSYN)  IV 3.375 g (01/15/20 0034)  . [MAR Hold] vancomycin 1,500 mg  (01/14/20 0917)   PRN Meds:.[MAR Hold] sodium chloride, [MAR Hold] acetaminophen **OR** [MAR Hold] acetaminophen, [MAR Hold] albuterol, [MAR Hold] fluticasone, [MAR Hold] HYDROcodone-acetaminophen, [MAR Hold]  morphine injection, [MAR Hold] ondansetron **OR** [MAR Hold] ondansetron (ZOFRAN) IV, [MAR Hold] traMADol   Assessment: Principal Problem:   Melena Active Problems:   CAD, multiple vessel   S/P CABG x 2   Atrial fibrillation status post cardioversion 10/22/19 (HCC)   Essential hypertension   DM (diabetes mellitus), type 2 (HCC)   Symptomatic anemia   Acute blood loss anemia   Suspect Septic arthritis (HCC)   Chronic anticoagulation   History of bilateral knee replacement   Chronic kidney disease, stage 3a   Acute on chronic congestive heart failure (HCC)   Elevated troponin    Plan: Proceed with  colonoscopy for evaluation of melena and anemia  I have discussed alternative options, risks & benefits,  which include, but are not limited to, bleeding, infection, perforation,respiratory complication & drug reaction.  The patient agrees with this plan & written consent will be obtained.      LOS: 5 days   [name], MD 01/15/2020, 8:13 AM

## 2020-01-15 NOTE — Progress Notes (Signed)
PROGRESS NOTE    Randall Vincent   NWG:[id number]  DOB: 04/08/1941  PCP: Elspeth Choerrell, Grace E., MD    DOA: 01/10/2020 LOS: 5   Brief Narrative   Randall HaysGeorge Vincent is a 79 y.o. male who complains of left knee pain and swelling for the last few days.  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  He has never had problems with the left knee until recently.  He was seen in our office at [facility] earlier this week complaining of pain and swelling.  He was scheduled for a bone scan to evaluate him for prosthetic loosening versus infection by Dr. Odis LusterBowers.  However he developed GI bleeding in the last day or so and has presented to the emergency room for this reason primarily.  Hemoglobin is down to 7.2 from a high of 13.5 earlier this year.  White blood count is 18,100.  Temperature is about 100 F.  The left knee pain and swelling was noted as well as well as a slightly elevated white count and mild fever.  He had cardioversion in June and July and has remained in sinus rhythm he says.  He was switched from Eliquis to Pradaxa at that time and took his last Pradaxa dose yesterday.       Assessment & Plan   Principal Problem:   Melena Active Problems:   CAD, multiple vessel   S/P CABG x 2   Atrial fibrillation status post cardioversion 10/22/19 [facility]HCC)   Essential hypertension   DM (diabetes mellitus), type 2 (HCC)   Symptomatic anemia   Acute blood loss anemia   Suspect Septic arthritis (HCC)   Chronic anticoagulation   History of bilateral knee replacement   Chronic kidney disease, stage 3a   Acute on chronic congestive heart failure (HCC)   Elevated troponin   Intestinal bypass or anastomosis status   Acute blood loss anemiasecondary GI bleeding - presented with melena in setting of anticoagulation.  EGD on 8/23 showed possible Barrett's esophagus changes  (biopsy contraindicated with bleeding), erosive gastropathy but no signs of recent of active bleeding.   --GI following --Colonoscopy to be repeated tomorrow, poor prep this AM -Hold pradaxa and all blood thinners for now --IV Protonix BID -Serial H&H's q8h --Transfuse if Hbg < 7.0 -Monitor closely. Currently hemodynamically stable.  Page GI if active bleeding or unstable. --needs to drink ALL bowel prep tonight (only drank 1/2 last night and was not cleaned out well enough)  Suspect Septic arthritis / History of bilateral knee replacement - POA.  History of knee replacement several years ago presenting with a several week history of worsening pain and swelling of the right knee.   --Ortho is consulted --Plan is to remove the prosthesis and place antibiotic spacer --ID is consulted --HOLD antibiotics until operative cultures obtained   Mild acute on chronic systolic CHF - presented with mildshortness of breathabove baseline, pulmonary vascular congestion on chest x-ray, BNP elevated at 400, all consistent with decompensated CHF (but SOB could be due to anemia as well).  No prior echo's at time of admission. Echo on 8/21 showed EF 50-55%, diastolic parameters not evaluated.   -Holding beta-blocker and ACE inhibitor in the setting of acute GI bleed and concern for hypotension -Low-dose Lasix only for overtly symptomatic dyspnea(concern for hypotension with GI bleeding) Appears euvolemic on exam and no further complaint  of SOB.   Elevated troponin - POA, secondary to demand ischemia in setting of anemia and renal failure reducing clearance.   --Monitor for chest pain and get stat EKG and troponin as needed.  CAD, multiple vessel, S/P CABG x 2 - stable.   -Holding aspirin and metoprolol for now  -Consider cardiology consult  --Continue statin, aspirin held, continue diltiazem as BP tolerates --Hold Aldactone due to renal failure  Atrial fibrillation status post cardioversion  10/22/19.  Currently in sinus rhythm.  Recently switched from Pradaxa to Eliquis on 12/18/2019 due to concern for renal function.  Pradaxa discontinued for now due to GI bleeding.  Essential hypertension - chronic.  Continued on home Diltiazem for A-fib and BP controlled.  Hold ramipril and aldactione due to AKI.  Monitor BP closely.  Type 2 Diabetes - well controlled, A1C is 6.7%.   -CBG's every 4 while n.p.o. with sliding scale coverage. --Home regimen is weekly Ozempic only (takes injection on Thursdays).  --Metformin previously discontinued due to recent renal insufficiency.  Acute kidney injury - POA, improving.  Likely pre-renal in setting of bleeding and hypotension.  Avoid nephrotoxins and hypotension.  Renally dose meds as indicated.   GFR at baseline. Creatinine trends as follows: 8/20: 1.77 8/21: 1.79 8/22: 1.71 8/23: 1.47 8/24: 1.55 8/25 1.31  Obesity: Body mass index is 40.89 kg/m.  Complicates overall care and prognosis.  DVT prophylaxis: SCDs Start: 01/10/20 2307   Diet:  Diet Orders (From admission, onward)    Start     Ordered   01/16/20 0001  Diet NPO time specified  Diet effective midnight        01/15/20 1748   01/15/20 0928  Diet clear liquid Room service appropriate? Yes; Fluid consistency: Thin  Diet effective now       Question Answer Comment  Room service appropriate? Yes   Fluid consistency: Thin      01/15/20 0927            Code Status: Full Code    Subjective 01/15/20    Patient seen up in chair with son at bedside this AM.  He denies any further melena or blood in stool, no blood in urine.  Says he feels well.  Says he may not want to have colonoscopy, thinks it isn't necessary.  I encouraged him to let us complete our evaluation since he needed transfusion.  Denies fever/chills, chest pain, SOB, N/V/D or other acute complaints.    Disposition Plan & Communication   Status is: Inpatient  Remains inpatient appropriate because:Ongoing  diagnostic testing needed not appropriate for outpatient work up   Dispo: The patient is from: Home              Anticipated d/c is to: Home              Anticipated d/c date is: 2 days              Patient currently is not medically stable to d/c.        Family Communication: son at bedside during encounter   Consults, Procedures, Significant Events   Consultants:   Orthopedics  Infectious Disease  Procedures:   EGD  Colonoscopy   Objective   Vitals:   01/15/20 0400 01/15/20 0750 01/15/20 0840 01/15/20 0850  BP: (!) 134/49 (!) 149/62 (!) 116/53 (!) 133/52  Pulse: 69 75 70 72  Resp: 18 20 (!) 27 (!) 23  Temp: 98.4 F (36.9 C) (!) 97.4 F (  36.3 C)    TempSrc: Oral Temporal    SpO2: 94% 100% 99% 100%  Weight:      Height:        Intake/Output Summary (Last 24 hours) at 01/15/2020 1801 Last data filed at 01/15/2020 0843 Gross per 24 hour  Intake 1601.56 ml  Output 625 ml  Net 976.56 ml   Filed Weights   01/13/20 0514 01/13/20 1326 01/14/20 0509  Weight: 122.4 kg 122 kg 122 kg    Physical Exam:  General exam: awake, alert, no acute distress, obese Respiratory system: CTAB, no wheezes, rales or rhonchi, normal respiratory effort. Cardiovascular system: normal S1/S2, RRR, no pedal edema.   Gastrointestinal system: soft, NT, ND, no HSM felt, +bowel sounds. Central nervous system: A&O x4. no gross focal neurologic deficits, normal speech Extremities: moves all, no cyanosis, normal tone Skin: dry, intact, normal temperature Psychiatry: normal mood, congruent affect, judgement and insight appear normal  Labs   Data Reviewed: I have personally reviewed following labs and imaging studies  CBC: Recent Labs  Lab 01/10/20 1837 01/11/20 0859 01/12/20 1522 01/12/20 1947 01/13/20 0444 01/13/20 0917 01/13/20 1457 01/14/20 0524 01/15/20 0419  WBC 18.1*  --  13.6*  --  13.1*  --   --  12.1* 11.4*  NEUTROABS 15.7*  --   --   --   --   --   --   --   --     HGB 8.0*   < > 8.9*  8.6*   < > 8.5* 9.0* 9.0* 8.5* 7.9*  HCT 25.7*   < > 27.1*  27.4*   < > 26.0* 28.7* 28.8* 27.3* 25.2*  MCV 84.0  --  85.6  --  83.6  --   --  86.4 86.0  PLT 426*  --  352  --  336  --   --  352 344   < > = values in this interval not displayed.   Basic Metabolic Panel: Recent Labs  Lab 01/11/20 0324 01/12/20 0527 01/13/20 0444 01/14/20 0524 01/15/20 0419  NA 139 135 133* 136 137  K 4.2 4.6 4.7 4.3 4.2  CL 108 106 107 106 109  CO2 20* 21* 17* 20* 21*  GLUCOSE 184* 155* 128* 150* 119*  BUN 40* 31* 25* 22 18  CREATININE 1.79* 1.71* 1.47* 1.55* 1.31*  CALCIUM 7.9* 8.3* 8.0* 8.4* 7.7*  MG  --   --  2.0  --   --    GFR: Estimated Creatinine Clearance: 59 mL/min (A) (by C-G formula based on SCr of 1.31 mg/dL (H)). Liver Function Tests: No results for input(s): AST, ALT, ALKPHOS, BILITOT, PROT, ALBUMIN in the last 168 hours. No results for input(s): LIPASE, AMYLASE in the last 168 hours. No results for input(s): AMMONIA in the last 168 hours. Coagulation Profile: Recent Labs  Lab 01/10/20 1837 01/11/20 0324  INR 1.9* 1.7*   Cardiac Enzymes: No results for input(s): CKTOTAL, CKMB, CKMBINDEX, TROPONINI in the last 168 hours. BNP (last 3 results) No results for input(s): PROBNP in the last 8760 hours. HbA1C: No results for input(s): HGBA1C in the last 72 hours. CBG: Recent Labs  Lab 01/14/20 2002 01/15/20 0012 01/15/20 0450 01/15/20 1122 01/15/20 1758  GLUCAP 137* 140* 117* 218* 112*   Lipid Profile: No results for input(s): CHOL, HDL, LDLCALC, TRIG, CHOLHDL, LDLDIRECT in the last 72 hours. Thyroid Function Tests: No results for input(s): TSH, T4TOTAL, FREET4, T3FREE, THYROIDAB in the last 72 hours. Anemia Panel: No results  for input(s): VITAMINB12, FOLATE, FERRITIN, TIBC, IRON, RETICCTPCT in the last 72 hours. Sepsis Labs: Recent Labs  Lab 01/10/20 1837 01/10/20 2028 01/11/20 0324  PROCALCITON  --   --  0.11  LATICACIDVEN 2.0* 1.3  --      Recent Results (from the past 240 hour(s))  Culture, blood (routine x 2)     Status: None (Preliminary result)   Collection Time: 01/11/20 12:37 AM   Specimen: BLOOD  Result Value Ref Range Status   Specimen Description BLOOD RIGHT ANTECUBITAL  Final   Special Requests   Final    BOTTLES DRAWN AEROBIC AND ANAEROBIC Blood Culture results may not be optimal due to an excessive volume of blood received in culture bottles   Culture   Final    NO GROWTH 4 DAYS Performed at [facility], [address], [place], [place] [address]    Report Status PENDING  Incomplete  Culture, blood (routine x 2)     Status: None (Preliminary result)   Collection Time: 01/11/20 12:37 AM   Specimen: BLOOD  Result Value Ref Range Status   Specimen Description BLOOD LEFT ANTECUBITAL  Final   Special Requests   Final    BOTTLES DRAWN AEROBIC AND ANAEROBIC Blood Culture results may not be optimal due to an excessive volume of blood received in culture bottles   Culture   Final    NO GROWTH 4 DAYS Performed at [facility], [address] [place], [place] [address]    Report Status PENDING  Incomplete  SARS Coronavirus 2 by RT PCR (hospital order, performed in [facility] hospital lab) Nasopharyngeal Nasopharyngeal Swab     Status: None   Collection Time: 01/11/20  6:14 AM   Specimen: Nasopharyngeal Swab  Result Value Ref Range Status   SARS Coronavirus 2 NEGATIVE NEGATIVE Final    Comment: (NOTE) SARS-CoV-2 target nucleic acids are NOT DETECTED.  The SARS-CoV-2 RNA is generally detectable in upper and lower respiratory specimens during the acute phase of infection. The lowest concentration of SARS-CoV-2 viral copies this assay can detect is 250 copies / mL. A negative result does not preclude SARS-CoV-2 infection and should not be used as the sole basis for treatment or other patient management decisions.  A negative result may occur with improper specimen collection / handling,  submission of specimen other than nasopharyngeal swab, presence of viral mutation(s) within the areas targeted by this assay, and inadequate number of viral copies (<250 copies / mL). A negative result must be combined with clinical observations, patient history, and epidemiological information.  Fact Sheet for Patients:   BoilerBrush.com.cy  Fact Sheet for Healthcare Providers: https://pope.com/  This test is not yet approved or  cleared by the [place] FDA and has been authorized for detection and/or diagnosis of SARS-CoV-2 by FDA under an Emergency Use Authorization (EUA).  This EUA will remain in effect (meaning this test can be used) for the duration of the COVID-19 declaration under Section 564(b)(1) of the Act, 21 U.S.C. section 360bbb-3(b)(1), unless the authorization is terminated or revoked sooner.  Performed at [facility], [address] [place], [place] [address]   Culture, body fluid-bottle     Status: Abnormal   Collection Time: 01/11/20  6:07 PM   Specimen: Fluid  Result Value Ref Range Status   Specimen Description FLUID KNEE  Final   Special Requests   Final    BOTTLES DRAWN AEROBIC AND ANAEROBIC Blood Culture adequate volume   Gram Stain  Final    GRAM POSITIVE COCCI IN CLUSTERS ANAEROBIC BOTTLE ONLY CRITICAL RESULT CALLED TO, READ BACK BY AND VERIFIED WITH: RN Sheria Lang [id number] FCP NO ORGANISMS SEEN AEROBIC BOTTLE ONLY Performed at [facility], [address], [place], [place] [address]    Culture ENTEROCOCCUS FAECALIS (A)  Final   Report Status 01/15/2020 FINAL  Final   Organism ID, Bacteria ENTEROCOCCUS FAECALIS  Final      Susceptibility   Enterococcus faecalis - MIC*    AMPICILLIN <=2 SENSITIVE Sensitive     VANCOMYCIN 1 SENSITIVE Sensitive     GENTAMICIN SYNERGY SENSITIVE Sensitive     * ENTEROCOCCUS FAECALIS  Gram stain     Status: None   Collection Time: 01/11/20   6:07 PM   Specimen: Fluid  Result Value Ref Range Status   Specimen Description FLUID KNEE  Final   Special Requests NONE  Final   Gram Stain   Final    FEW WBC PRESENT,BOTH PMN AND MONONUCLEAR NO ORGANISMS SEEN Performed at [facility], [address] [place], [place] [address]    Report Status 01/12/2020 FINAL  Final      Imaging Studies   No results found.   Medications   Scheduled Meds: . sodium chloride   Intravenous Once  . amiodarone  200 mg Oral Daily  . atorvastatin  40 mg Oral Daily  . diltiazem  60 mg Oral Q8H  . insulin aspart  0-15 Units Subcutaneous Q4H  . pantoprazole  40 mg Intravenous Q12H  . polyethylene glycol-electrolytes  4,000 mL Oral Once  . vitamin B-12  1,000 mcg Oral Daily   Continuous Infusions: . sodium chloride 20 mL/hr at 01/15/20 0815  . sodium chloride 500 mL (01/14/20 1643)       LOS: 5 days    Time spent: 36 minutes with >50% spent in coordination of care and direct patient contact.    Pennie Banter, DO [facility]  01/15/2020, 6:01 PM    If 7PM-7AM, please contact night-coverage. How to contact the [facility] Attending or Consulting provider 7A - 7P or covering provider during after hours 7P -7A, for this patient?    1. Check the care team in [facility] and look for a) attending/consulting [facility] provider listed and b) the [facility] team listed 2. Log into [email or web address] and use [place]'s universal password to access. If you do not have the password, please contact the hospital operator. 3. Locate the [facility] provider you are looking for under [facility] and page to a number that you can be directly reached. 4. If you still have difficulty reaching the provider, please page the [facility] (Director on Call) for the Hospitalists listed on amion for assistance.

## 2020-01-16 ENCOUNTER — Inpatient Hospital Stay: Payer: Medicare Other | Admitting: Anesthesiology

## 2020-01-16 ENCOUNTER — Inpatient Hospital Stay: Payer: Medicare Other | Admitting: Registered Nurse

## 2020-01-16 ENCOUNTER — Encounter
Admission: EM | Disposition: A | Discharge status: Skilled Nursing Facility | Payer: Self-pay | Source: Home / Self Care | Attending: Internal Medicine

## 2020-01-16 ENCOUNTER — Inpatient Hospital Stay: Payer: Medicare Other

## 2020-01-16 ENCOUNTER — Encounter: Payer: Self-pay | Admitting: Internal Medicine

## 2020-01-16 DIAGNOSIS — K5730 Diverticulosis of large intestine without perforation or abscess without bleeding: Secondary | ICD-10-CM

## 2020-01-16 DIAGNOSIS — Z7901 Long term (current) use of anticoagulants: Secondary | ICD-10-CM

## 2020-01-16 DIAGNOSIS — N179 Acute kidney failure, unspecified: Secondary | ICD-10-CM

## 2020-01-16 DIAGNOSIS — T8450XA Infection and inflammatory reaction due to unspecified internal joint prosthesis, initial encounter: Secondary | ICD-10-CM

## 2020-01-16 DIAGNOSIS — N1831 Chronic kidney disease, stage 3a: Secondary | ICD-10-CM

## 2020-01-16 HISTORY — PX: COLONOSCOPY: SHX5424

## 2020-01-16 HISTORY — PX: TOTAL KNEE REVISION: SHX996

## 2020-01-16 LAB — CBC
HCT: 24.2 % — ABNORMAL LOW (ref 39.0–52.0)
Hemoglobin: 7.6 g/dL — ABNORMAL LOW (ref 13.0–17.0)
MCH: 27 pg (ref 26.0–34.0)
MCHC: 31.4 g/dL (ref 30.0–36.0)
MCV: 85.8 fL (ref 80.0–100.0)
Platelets: 320 10*3/uL (ref 150–400)
RBC: 2.82 MIL/uL — ABNORMAL LOW (ref 4.22–5.81)
RDW: 17 % — ABNORMAL HIGH (ref 11.5–15.5)
WBC: 10.3 10*3/uL (ref 4.0–10.5)
nRBC: 0.2 % (ref 0.0–0.2)

## 2020-01-16 LAB — BASIC METABOLIC PANEL
Anion gap: 8 (ref 5–15)
BUN: 14 mg/dL (ref 8–23)
CO2: 22 mmol/L (ref 22–32)
Calcium: 7.5 mg/dL — ABNORMAL LOW (ref 8.9–10.3)
Chloride: 107 mmol/L (ref 98–111)
Creatinine, Ser: 1.21 mg/dL (ref 0.61–1.24)
GFR calc Af Amer: >60 mL/min (ref >60)
GFR calc non Af Amer: 57 mL/min — ABNORMAL LOW (ref >60)
Glucose, Bld: 127 mg/dL — ABNORMAL HIGH (ref 70–99)
Potassium: 3.9 mmol/L (ref 3.5–5.1)
Sodium: 137 mmol/L (ref 135–145)

## 2020-01-16 LAB — GLUCOSE, CAPILLARY
Glucose-Capillary: 118 mg/dL — ABNORMAL HIGH (ref 70–99)
Glucose-Capillary: 120 mg/dL — ABNORMAL HIGH (ref 70–99)
Glucose-Capillary: 162 mg/dL — ABNORMAL HIGH (ref 70–99)
Glucose-Capillary: 224 mg/dL — ABNORMAL HIGH (ref 70–99)

## 2020-01-16 LAB — CULTURE, BLOOD (ROUTINE X 2)
Culture: NO GROWTH
Culture: NO GROWTH

## 2020-01-16 LAB — HEMOGLOBIN AND HEMATOCRIT, BLOOD
HCT: 26.7 % — ABNORMAL LOW (ref 39.0–52.0)
HCT: 28.3 % — ABNORMAL LOW (ref 39.0–52.0)
Hemoglobin: 8.3 g/dL — ABNORMAL LOW (ref 13.0–17.0)
Hemoglobin: 9.2 g/dL — ABNORMAL LOW (ref 13.0–17.0)

## 2020-01-16 LAB — PREPARE RBC (CROSSMATCH)

## 2020-01-16 LAB — MAGNESIUM: Magnesium: 1.7 mg/dL (ref 1.7–2.4)

## 2020-01-16 SURGERY — TOTAL KNEE REVISION
Anesthesia: Choice | Site: Knee | Laterality: Left

## 2020-01-16 SURGERY — COLONOSCOPY
Anesthesia: General

## 2020-01-16 MED ORDER — ACETAMINOPHEN 10 MG/ML IV SOLN
INTRAVENOUS | Status: DC | PRN
  Administered 2020-01-16: 1000 mg via INTRAVENOUS

## 2020-01-16 MED ORDER — PROPOFOL 500 MG/50ML IV EMUL
INTRAVENOUS | Status: DC | PRN
  Administered 2020-01-16: 120 ug/kg/min via INTRAVENOUS

## 2020-01-16 MED ORDER — EPHEDRINE SULFATE 50 MG/ML IJ SOLN
INTRAMUSCULAR | Status: DC | PRN
  Administered 2020-01-16 (×2): 10 mg via INTRAVENOUS

## 2020-01-16 MED ORDER — FENTANYL CITRATE (PF) 100 MCG/2ML IJ SOLN
INTRAMUSCULAR | Status: DC | PRN
Start: 2020-01-16 — End: 2020-01-16
  Administered 2020-01-16: 50 ug via INTRAVENOUS
  Administered 2020-01-16: 100 ug via INTRAVENOUS

## 2020-01-16 MED ORDER — ONDANSETRON HCL 4 MG/2ML IJ SOLN: 4.0000 mg | Freq: Once | INTRAMUSCULAR | Status: DC | PRN

## 2020-01-16 MED ORDER — OXYCODONE HCL 5 MG/5ML PO SOLN: 5.0000 mg | Freq: Once | ORAL | Status: DC | PRN

## 2020-01-16 MED ORDER — FENTANYL CITRATE (PF) 100 MCG/2ML IJ SOLN
25.0000 ug | INTRAMUSCULAR | Status: DC | PRN
  Administered 2020-01-16 (×2): 25 ug via INTRAVENOUS

## 2020-01-16 MED ORDER — VANCOMYCIN HCL IN DEXTROSE 1-5 GM/200ML-% IV SOLN
1000.0000 mg | INTRAVENOUS | Status: AC
  Administered 2020-01-16: 1000 mg via INTRAVENOUS

## 2020-01-16 MED ORDER — SODIUM CHLORIDE 0.9 % IV SOLN
2.0000 g | INTRAVENOUS | Status: DC
  Administered 2020-01-16 – 2020-01-19 (×4): 2 g via INTRAVENOUS
  Filled 2020-01-16 (×3): qty 2
  Filled 2020-01-16: qty 20
  Filled 2020-01-16: qty 2

## 2020-01-16 MED ORDER — OXYCODONE HCL 5 MG PO TABS: 5.0000 mg | ORAL_TABLET | Freq: Once | ORAL | Status: DC | PRN

## 2020-01-16 MED ORDER — DEXAMETHASONE SODIUM PHOSPHATE 10 MG/ML IJ SOLN
INTRAMUSCULAR | Status: AC
  Filled 2020-01-16: qty 1

## 2020-01-16 MED ORDER — SODIUM CHLORIDE 0.9 % IR SOLN
Status: DC | PRN
  Administered 2020-01-16: 3000 mL

## 2020-01-16 MED ORDER — FENTANYL CITRATE (PF) 100 MCG/2ML IJ SOLN
INTRAMUSCULAR | Status: AC
  Filled 2020-01-16: qty 2

## 2020-01-16 MED ORDER — PROPOFOL 500 MG/50ML IV EMUL
INTRAVENOUS | Status: AC
  Filled 2020-01-16: qty 50

## 2020-01-16 MED ORDER — VANCOMYCIN HCL 500 MG/100ML IV SOLN
500.0000 mg | Freq: Once | INTRAVENOUS | Status: AC
  Administered 2020-01-16: 500 mg via INTRAVENOUS
  Filled 2020-01-16: qty 100

## 2020-01-16 MED ORDER — LIDOCAINE HCL (CARDIAC) PF 100 MG/5ML IV SOSY
PREFILLED_SYRINGE | INTRAVENOUS | Status: DC | PRN
  Administered 2020-01-16: 80 mg via INTRAVENOUS

## 2020-01-16 MED ORDER — TOBRAMYCIN SULFATE 1.2 G IJ SOLR
INTRAMUSCULAR | Status: AC
  Filled 2020-01-16: qty 1.2

## 2020-01-16 MED ORDER — VANCOMYCIN HCL 1000 MG IV SOLR
INTRAVENOUS | Status: AC
  Filled 2020-01-16: qty 2000

## 2020-01-16 MED ORDER — SODIUM CHLORIDE 0.9 % IV SOLN: INTRAVENOUS | Status: DC

## 2020-01-16 MED ORDER — ROCURONIUM BROMIDE 100 MG/10ML IV SOLN
INTRAVENOUS | Status: DC | PRN
  Administered 2020-01-16: 80 mg via INTRAVENOUS
  Administered 2020-01-16: 20 mg via INTRAVENOUS

## 2020-01-16 MED ORDER — LIDOCAINE HCL (CARDIAC) PF 100 MG/5ML IV SOSY
PREFILLED_SYRINGE | INTRAVENOUS | Status: DC | PRN
  Administered 2020-01-16: 40 mg via INTRAVENOUS

## 2020-01-16 MED ORDER — SODIUM CHLORIDE (PF) 0.9 % IJ SOLN
INTRAMUSCULAR | Status: AC
  Filled 2020-01-16: qty 10

## 2020-01-16 MED ORDER — VANCOMYCIN HCL 1500 MG/300ML IV SOLN
1500.0000 mg | INTRAVENOUS | Status: DC
  Administered 2020-01-17 – 2020-01-20 (×4): 1500 mg via INTRAVENOUS
  Filled 2020-01-16 (×5): qty 300

## 2020-01-16 MED ORDER — TOBRAMYCIN SULFATE 1.2 G IJ SOLR
INTRAMUSCULAR | Status: AC
  Filled 2020-01-16: qty 2.4

## 2020-01-16 MED ORDER — SUGAMMADEX SODIUM 500 MG/5ML IV SOLN
INTRAVENOUS | Status: DC | PRN
  Administered 2020-01-16: 300 mg via INTRAVENOUS

## 2020-01-16 MED ORDER — LIDOCAINE HCL (PF) 2 % IJ SOLN
INTRAMUSCULAR | Status: AC
  Filled 2020-01-16: qty 10

## 2020-01-16 MED ORDER — ACETAMINOPHEN 10 MG/ML IV SOLN
1000.0000 mg | Freq: Once | INTRAVENOUS | Status: DC | PRN
  Filled 2020-01-16: qty 100

## 2020-01-16 MED ORDER — ONDANSETRON HCL 4 MG/2ML IJ SOLN
INTRAMUSCULAR | Status: DC | PRN
  Administered 2020-01-16: 4 mg via INTRAVENOUS

## 2020-01-16 MED ORDER — FENTANYL CITRATE (PF) 100 MCG/2ML IJ SOLN
INTRAMUSCULAR | Status: AC
Start: 2020-01-16 — End: 2020-01-16
  Administered 2020-01-16: 25 ug via INTRAVENOUS
  Filled 2020-01-16: qty 2

## 2020-01-16 MED ORDER — VANCOMYCIN HCL IN DEXTROSE 1-5 GM/200ML-% IV SOLN
INTRAVENOUS | Status: AC
  Filled 2020-01-16: qty 200

## 2020-01-16 MED ORDER — PIPERACILLIN-TAZOBACTAM 3.375 G IVPB: 3.3750 g | INTRAVENOUS | Status: DC

## 2020-01-16 MED ORDER — ACETAMINOPHEN 10 MG/ML IV SOLN
INTRAVENOUS | Status: AC
  Filled 2020-01-16: qty 100

## 2020-01-16 MED ORDER — PIPERACILLIN-TAZOBACTAM 3.375 G IVPB
INTRAVENOUS | Status: AC
  Filled 2020-01-16: qty 50

## 2020-01-16 MED ORDER — SODIUM CHLORIDE 0.9 % IV SOLN: INTRAVENOUS | Status: DC | PRN

## 2020-01-16 MED ORDER — PEG 3350-KCL-NA BICARB-NACL 420 G PO SOLR
2000.0000 mL | Freq: Once | ORAL | Status: AC
  Administered 2020-01-16: 2000 mL via ORAL
  Filled 2020-01-16: qty 4000

## 2020-01-16 MED ORDER — PROPOFOL 10 MG/ML IV BOLUS
INTRAVENOUS | Status: DC | PRN
  Administered 2020-01-16: 40 mg via INTRAVENOUS
  Administered 2020-01-16: 100 mg via INTRAVENOUS

## 2020-01-16 MED ORDER — VANCOMYCIN HCL IN DEXTROSE 1-5 GM/200ML-% IV SOLN
1000.0000 mg | Freq: Two times a day (BID) | INTRAVENOUS | Status: DC
  Filled 2020-01-16 (×2): qty 200

## 2020-01-16 MED ORDER — MAGNESIUM SULFATE 2 GM/50ML IV SOLN
2.0000 g | Freq: Once | INTRAVENOUS | Status: AC
  Administered 2020-01-16: 2 g via INTRAVENOUS
  Filled 2020-01-16 (×2): qty 50

## 2020-01-16 MED ORDER — BACITRACIN 50000 UNITS IM SOLR
INTRAMUSCULAR | Status: AC
  Filled 2020-01-16: qty 1

## 2020-01-16 MED ORDER — PROPOFOL 10 MG/ML IV BOLUS
INTRAVENOUS | Status: DC | PRN
  Administered 2020-01-16: 50 mg via INTRAVENOUS

## 2020-01-16 MED ORDER — FENTANYL CITRATE (PF) 100 MCG/2ML IJ SOLN: 25.0000 ug | INTRAMUSCULAR | Status: DC | PRN

## 2020-01-16 MED ORDER — ONDANSETRON HCL 4 MG/2ML IJ SOLN
INTRAMUSCULAR | Status: AC
  Filled 2020-01-16: qty 2

## 2020-01-16 MED ORDER — EPHEDRINE 5 MG/ML INJ
INTRAVENOUS | Status: AC
  Filled 2020-01-16: qty 10

## 2020-01-16 MED ORDER — SODIUM CHLORIDE 0.9 % IV SOLN
INTRAVENOUS | Status: DC | PRN
  Administered 2020-01-16: 25 ug/min via INTRAVENOUS

## 2020-01-16 SURGICAL SUPPLY — 57 items
BLADE SAW 70X12.5 (BLADE) ×2 IMPLANT
BLADE SAW SGTL 13X75X1.27 (BLADE) ×2 IMPLANT
BRUSH SCRUB EZ  4% CHG (MISCELLANEOUS) ×4
BRUSH SCRUB EZ 4% CHG (MISCELLANEOUS) ×2 IMPLANT
CANISTER SUCT 1200ML W/VALVE (MISCELLANEOUS) ×3 IMPLANT
CANISTER SUCT 3000ML PPV (MISCELLANEOUS) ×6 IMPLANT
CEMENT BONE 40GM (Cement) ×4 IMPLANT
CHLORAPREP W/TINT 26 (MISCELLANEOUS) ×6 IMPLANT
COMPONENT FEMORAL SZ 8 LT (Knees) ×2 IMPLANT
COOLER POLAR GLACIER W/PUMP (MISCELLANEOUS) ×3 IMPLANT
COVER WAND RF STERILE (DRAPES) ×3 IMPLANT
CUFF TOURN SGL QUICK 24 (TOURNIQUET CUFF)
CUFF TOURN SGL QUICK 30 (TOURNIQUET CUFF)
CUFF TRNQT CYL 24X4X16.5-23 (TOURNIQUET CUFF) IMPLANT
CUFF TRNQT CYL 30X4X21-28X (TOURNIQUET CUFF) IMPLANT
DRAPE 3/4 80X56 (DRAPES) ×3 IMPLANT
DRAPE EXTREMITY 106X87X128.5 (DRAPES) ×2 IMPLANT
DRAPE INCISE IOBAN 66X60 STRL (DRAPES) ×3 IMPLANT
DRSG AQUACEL AG ADV 3.5X14 (GAUZE/BANDAGES/DRESSINGS) ×3 IMPLANT
GAUZE XEROFORM 1X8 LF (GAUZE/BANDAGES/DRESSINGS) ×3 IMPLANT
GLOVE INDICATOR 8.0 STRL GRN (GLOVE) ×3 IMPLANT
GLOVE SURG ORTHO 8.0 STRL STRW (GLOVE) ×6 IMPLANT
GOWN STRL REUS W/ TWL LRG LVL3 (GOWN DISPOSABLE) ×2 IMPLANT
GOWN STRL REUS W/ TWL XL LVL3 (GOWN DISPOSABLE) ×1 IMPLANT
GOWN STRL REUS W/TWL LRG LVL3 (GOWN DISPOSABLE) ×4
GOWN STRL REUS W/TWL XL LVL3 (GOWN DISPOSABLE) ×2
HOLDER FOLEY CATH W/STRAP (MISCELLANEOUS) ×3 IMPLANT
HOOD PEEL AWAY FLYTE STAYCOOL (MISCELLANEOUS) ×9 IMPLANT
INSERT ART HI FLEX 9 SZ5-6 (Insert) ×2 IMPLANT
IV NS 1000ML (IV SOLUTION) ×2
IV NS 1000ML BAXH (IV SOLUTION) ×1 IMPLANT
KIT TURNOVER KIT A (KITS) ×3 IMPLANT
LABEL OR SOLS (LABEL) ×3 IMPLANT
MAT ABSORB  FLUID 56X50 GRAY (MISCELLANEOUS) ×2
MAT ABSORB FLUID 56X50 GRAY (MISCELLANEOUS) ×1 IMPLANT
NDL SAFETY ECLIPSE 18X1.5 (NEEDLE) ×1 IMPLANT
NDL SPNL 20GX3.5 QUINCKE YW (NEEDLE) ×1 IMPLANT
NEEDLE HYPO 18GX1.5 SHARP (NEEDLE) ×2
NEEDLE SPNL 20GX3.5 QUINCKE YW (NEEDLE) ×3 IMPLANT
NS IRRIG 1000ML POUR BTL (IV SOLUTION) ×3 IMPLANT
PACK TOTAL KNEE (MISCELLANEOUS) ×3 IMPLANT
PAD DE MAYO PRESSURE PROTECT (MISCELLANEOUS) ×3 IMPLANT
PAD WRAPON POLAR KNEE (MISCELLANEOUS) ×2 IMPLANT
PULSAVAC PLUS IRRIG FAN TIP (DISPOSABLE) ×3
STAPLER SKIN PROX 35W (STAPLE) ×3 IMPLANT
SUCTION FRAZIER HANDLE 10FR (MISCELLANEOUS) ×2
SUCTION TUBE FRAZIER 10FR DISP (MISCELLANEOUS) ×1 IMPLANT
SUT DVC 2 QUILL PDO  T11 36X36 (SUTURE) ×2
SUT DVC 2 QUILL PDO T11 36X36 (SUTURE) ×1 IMPLANT
SUT VIC AB 2-0 CT1 18 (SUTURE) ×5 IMPLANT
SUT VIC AB 2-0 CT1 27 (SUTURE) ×2
SUT VIC AB 2-0 CT1 TAPERPNT 27 (SUTURE) ×1 IMPLANT
SUT VIC AB PLUS 45CM 1-MO-4 (SUTURE) ×3 IMPLANT
SYR 30ML LL (SYRINGE) ×6 IMPLANT
TIP FAN IRRIG PULSAVAC PLUS (DISPOSABLE) ×1 IMPLANT
TRAY FOLEY MTR SLVR 16FR STAT (SET/KITS/TRAYS/PACK) ×3 IMPLANT
WRAPON POLAR PAD KNEE (MISCELLANEOUS) ×6

## 2020-01-16 NOTE — Op Note (Signed)
[facility] Patient Name: Randall Vincent Procedure Date: 01/16/2020 8:15 AM MRN: [id number] Account #: [id number] Date of Birth: 1941/02/03 Admit Type: Inpatient Age: 78 Room: [facility] ENDO ROOM 1 Gender: Male Note Status: Finalized Procedure:             Colonoscopy Providers:             Jeanclaude Wentworth B. Maximino Greenland MD, MD Referring MD:          Sallye Lat Md, MD (Referring MD) Medicines:             Monitored Anesthesia Care Complications:         No immediate complications. Procedure:             Pre-Anesthesia Assessment:                        - Prior to the procedure, a History and Physical was                         performed, and patient medications, allergies and                         sensitivities were reviewed. The patient's tolerance                         of previous anesthesia was reviewed.                        - The risks and benefits of the procedure and the                         sedation options and risks were discussed with the                         patient. All questions were answered and informed                         consent was obtained.                        - Patient identification and proposed procedure were                         verified prior to the procedure by the physician, the                         nurse, the anesthetist and the technician. The                         procedure was verified in the pre-procedure area in                         the procedure room in the endoscopy suite.                        - ASA Grade Assessment: II - A patient with mild                         systemic disease.                        -  After reviewing the risks and benefits, the patient                         was deemed in satisfactory condition to undergo the                         procedure.                        After obtaining informed consent, the colonoscope was                         passed under direct vision.  Throughout the procedure,                         the patient's blood pressure, pulse, and oxygen                         saturations were monitored continuously. The                         Colonoscope was introduced through the anus and                         advanced to the the cecum, identified by appendiceal                         orifice and ileocecal valve. The colonoscopy was                         performed with ease. The patient tolerated the                         procedure well. The quality of the bowel preparation                         was fair except the cecum was poor. Findings:      The perianal and digital rectal examinations were normal.      Multiple diverticula were found in the anastomosis.      There was evidence of a prior surgical anastomosis in the sigmoid colon.       This was patent and was characterized by healthy appearing mucosa.      The exam was otherwise without abnormality.      Non-bleeding internal hemorrhoids were found during retroflexion. The       hemorrhoids were medium-sized. Impression:            - Diverticulosis at the colonic anastomosis.                        - Patent surgical anastomosis, characterized by                         healthy appearing mucosa.                        - The examination was otherwise normal.                        - Non-bleeding internal hemorrhoids.                        -  No specimens collected.                        - Due to the prep, this was not a satisfactory exam                         for colorectal cancer screening, that is evaluation                         for small or flat lesions or polyps. Patient should                         follow up in clinic after discharge to discuss need                         for future colonoscopy and colorectal cancer screening.                        - Large lesions were ruled out but small lesions or                         polyps could still be  present. Recommendation:        - Continue present medications.                        - High fiber diet.                        - Return to primary care physician as previously                         scheduled.                        - The findings and recommendations were discussed with                         the patient.                        - Continue Serial CBCs and transfuse PRN                        - Hemoglobin has been stable and no further bleeding                         has occured. Pt should follow up in GI Clinic in 2-4                         weeks of discharge and outpt capsule study can be                         considered based on signs and symptoms. If melena or                         active bleeding occurs as an inpatient, capsule study                         can  be considered as an inpatient after his knee                         surgery. Procedure Code(s):     --- Professional ---                        [phone], Colonoscopy, flexible; diagnostic, including                         collection of specimen(s) by brushing or washing, when                         performed (separate procedure) Diagnosis Code(s):     --- Professional ---                        K64.8, Other hemorrhoids                        Z98.0, Intestinal bypass and anastomosis status CPT copyright 2019 American Medical Association. All rights reserved. The codes documented in this report are preliminary and upon coder review may  be revised to meet current compliance requirements.  Melodie BouillonVarnita Marvena Tally, MD Michel BickersVarnita B. Maximino Greenlandahiliani MD, MD 01/16/2020 9:05:08 AM This report has been signed electronically. Number of Addenda: 0 Note Initiated On: 01/16/2020 8:15 AM Scope Withdrawal Time: 0 hours 17 minutes 45 seconds  Total Procedure Duration: 0 hours 25 minutes 20 seconds       [facility]

## 2020-01-16 NOTE — Progress Notes (Signed)
Pharmacy Antibiotic Note  Randall Vincent is a 79 y.o. male admitted on 01/10/2020 with septic arthritis.  Pharmacy has been consulted for  Vancomycin  dosing.  Plan: Will continue vancomycin 1500mg  IV every 24 hours.  Pharmacy will continue to monitor renal function and adjust as needed.    Height: 5\' 8"  (172.7 cm) Weight: 120.6 kg (265 lb 14.4 oz) IBW/kg (Calculated) : 68.4  Temp (24hrs), Avg:98 F (36.7 C), Min:97.4 F (36.3 C), Max:98.6 F (37 C)  Recent Labs  Lab 01/10/20 1837 01/10/20 1837 01/10/20 2028 01/11/20 0324 01/12/20 0527 01/12/20 1522 01/13/20 0444 01/14/20 0524 01/15/20 0419 01/16/20 0239  WBC 18.1*   < >  --   --   --  13.6* 13.1* 12.1* 11.4* 10.3  CREATININE 1.77*  --   --    < > 1.71*  --  1.47* 1.55* 1.31* 1.21  LATICACIDVEN 2.0*  --  1.3  --   --   --   --   --   --   --    < > = values in this interval not displayed.    Estimated Creatinine Clearance: 63.6 mL/min (by C-G formula based on SCr of 1.21 mg/dL).    No Known Allergies  Antimicrobials this admission: 8/20 Zosyn>> 8/25 8/21 Vancomycin >>  8/26 Ceftriaxone>>   Microbiology results: 8/21 Knee Fluid Culture: Enteroccus faecalis  8/21 Bcx: NG Final   Thank you for allowing pharmacy to be a part of this patient's care.  9/21, PharmD, BCPS Clinical Pharmacist 01/16/2020 6:09 PM

## 2020-01-16 NOTE — Anesthesia Preprocedure Evaluation (Deleted)
Anesthesia Evaluation  Patient identified by MRN, date of birth, ID band Patient awake    Reviewed: Allergy & Precautions, H&P , NPO status , Patient's Chart, lab work & pertinent test results, reviewed documented beta blocker date and time   History of Anesthesia Complications Negative for: history of anesthetic complications  Airway Mallampati: I  TM Distance: >3 FB Neck ROM: full    Dental  (+) Edentulous Upper, Edentulous Lower   Pulmonary neg pulmonary ROS, former smoker,  Patient has new onset cough in past 2 days. Patient vaccinated for covid, negative test. Lungs CTAB.    Pulmonary exam normal breath sounds clear to auscultation       Cardiovascular Exercise Tolerance: Poor hypertension, On Medications + angina with exertion + CAD, + Cardiac Stents, + CABG and +CHF  Normal cardiovascular exam+ dysrhythmias Atrial Fibrillation  Rhythm:regular Rate:Normal - Systolic murmurs afib now in sinus s/p cardioversion  TTE 2021: 1. Left ventricular ejection fraction, by estimation, is 50 to 55%. The  left ventricle has low normal function. Left ventricular endocardial  border not optimally defined to evaluate regional wall motion. Left  ventricular diastolic function could not be  evaluated.  2. Right ventricular systolic function is normal. The right ventricular  size is normal. Tricuspid regurgitation signal is inadequate for assessing  PA pressure.  3. Left atrial size was mildly dilated.  4. The mitral valve is normal in structure. No evidence of mitral valve  regurgitation. No evidence of mitral stenosis.  5. The aortic valve is normal in structure. Aortic valve regurgitation is  not visualized. Mild aortic valve sclerosis is present, with no evidence  of aortic valve stenosis.  6. Mildly dilated pulmonary artery.  7. The inferior vena cava is normal in size with greater than 50%  respiratory variability, suggesting  right atrial pressure of 3 mmHg.    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, to go to OR tomorrow for antibiotic spacer   Abdominal (+) + obese,   Peds  Hematology  (+) Blood dyscrasia, anemia ,   Anesthesia Other Findings Past Medical History: No date: A-fib (HCC) No date: Diabetes (HCC) No date: Hyperlipidemia No date: Hypertension Past Surgical History: No date: BLADDER SURGERY No date: BYPASS GRAFT No date: CARDIOVERSION No date: COLON SURGERY No date: REPLACEMENT TOTAL KNEE BILATERAL No date: STENT PLACEMENT VASCULAR ([facility] HX) BMI    Body Mass Index: 38.73 kg/m     Reproductive/Obstetrics negative OB ROS                             Anesthesia Physical  Anesthesia Plan  ASA: III  Anesthesia Plan: General   Post-op Pain Management:    Induction: Intravenous  PONV Risk Score and Plan: 2 and Ondansetron, Propofol infusion and TIVA  Airway Management Planned: Natural Airway and Simple Face Mask  Additional Equipment: None  Intra-op Plan:   Post-operative Plan:   Informed Consent: I have reviewed the patients History and Physical, chart, labs and discussed the procedure including the risks, benefits and alternatives for the proposed anesthesia with the patient or authorized representative who has indicated his/her understanding and acceptance.     Dental Advisory Given  Plan Discussed with: CRNA  Anesthesia Plan Comments: (Procedure of   an urgent nature for evaluation of melena and anemia. Cannot delay case due to cough. Patient advised of increased risks. Discussed risks of anesthesia with patient, including possibility of difficulty with spontaneous ventilation under anesthesia  necessitating airway intervention, PONV, and rare risks such as cardiac or respiratory or neurological events. Patient understands. Patient informed about increased incidence of above perioperative risk due to high BMI. Patient understands. )        Anesthesia Quick Evaluation

## 2020-01-16 NOTE — TOC Initial Note (Signed)
Transition of Care [facility]) - Initial/Assessment Note    Patient Details  Name: Randall Vincent MRN: [id number] Date of Birth: 1940/08/15  Transition of Care [facility]) CM/SW Contact:    Shawn Route, RN Phone Number: 01/16/2020, 1:15 PM  Clinical Narrative:                   Patient in procedure.  Spoke with son.  He said he and his father understand that he will need short term rehab after surgery.  FL2 completed and Bed Search started in agreed upon area.       Barriers to Discharge: Continued Medical Work up   Patient Goals and CMS Choice        Expected Discharge Plan and Services                                                Prior Living Arrangements/Services                       Activities of Daily Living      Permission Sought/Granted                  Emotional Assessment              Admission diagnosis:  Upper GI bleed [K92.2] Neutrophilia [D72.9] Anticoagulated [Z79.01] AKI (acute kidney injury) (HCC) [N17.9] Bilateral knee effusions [M25.461, M25.462] Acute pain of left knee [M25.562] Patient Active Problem List   Diagnosis Date Noted  . Diverticulosis of colon without hemorrhage   . Intestinal bypass or anastomosis status   . Melena 01/10/2020  . Symptomatic anemia 01/10/2020  . Acute blood loss anemia 01/10/2020  . Suspect Septic arthritis (HCC) 01/10/2020  . Chronic anticoagulation 01/10/2020  . 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 Chronic kidney disease, stage 3a 01/10/2020  . Acute on chronic congestive heart failure (HCC) 01/10/2020  . Elevated troponin 01/10/2020  . Chest pain 06/26/2019  . CAD, multiple vessel 06/26/2019  . 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 Atrial fibrillation status post cardioversion 10/22/19 (HCC) 06/26/2019  . Essential hypertension 06/26/2019  . DM (diabetes mellitus), type 2 (HCC) 06/26/2019  . Anginal equivalent (HCC) 06/26/2019   PCP:  Elspeth Cho., MD Pharmacy:    [facility], [place] - [address] [place] [address] Phone: [phone] Fax: [phone]  [facility] - [place], [place] - [address] [place] [address] Phone: [phone] Fax: [phone]     Social Determinants of Health (SDOH) Interventions    Readmission Risk Interventions No flowsheet data found.

## 2020-01-16 NOTE — Progress Notes (Signed)
Pt is still not clear notified NP. Pt has drank an additional 2,000 ml of Nulytely as ordered.

## 2020-01-16 NOTE — Progress Notes (Signed)
Talked with pt's son and pt completed prep completely. Pt still has murky, watery stools. Informed by son that pt had a surgery in the past where part of the intestine was removed and at times pt has problems with impactions and has to take Dulcolax by mouth. Pt also stated at times that fleet enemas have to be used as well. Pt has been very compliant and informed by this nurse to be sure to complete prep even on previous shift where pt did not want to complete prep in its entirety. Pt's son helped with ensuring this was completed on this shift. Pt output being closely monitored.

## 2020-01-16 NOTE — Transfer of Care (Signed)
Immediate Anesthesia Transfer of Care Note  Patient: Randall Vincent  Procedure(s) Performed: TOTAL KNEE REVISION (Left Knee)  Patient Location: PACU  Anesthesia Type:General  Level of Consciousness: awake, oriented, drowsy and patient cooperative  Airway & Oxygen Therapy: Patient Spontanous Breathing  Post-op Assessment: Report given to RN and Post -op Vital signs reviewed and stable  Post vital signs: Reviewed and stable  Last Vitals:  Vitals Value Taken Time  BP 120/53 01/16/20 1435  Temp 36.4 C 01/16/20 1435  Pulse 84 01/16/20 1441  Resp 20 01/16/20 1441  SpO2 97 % 01/16/20 1441  Vitals shown include unvalidated device data.  Last Pain:  Vitals:   01/16/20 0741  TempSrc: Tympanic  PainSc: 0-No pain      Patients Stated Pain Goal: 2 (01/13/20 2140)  Complications: No complications documented.

## 2020-01-16 NOTE — Anesthesia Preprocedure Evaluation (Signed)
Anesthesia Evaluation  Patient identified by MRN, date of birth, ID band Patient awake    Reviewed: Allergy & Precautions, H&P , NPO status , Patient's Chart, lab work & pertinent test results, reviewed documented beta blocker date and time   History of Anesthesia Complications Negative for: history of anesthetic complications  Airway Mallampati: I  TM Distance: >3 FB Neck ROM: full    Dental  (+) Edentulous Upper, Edentulous Lower, Dental Advisory Given   Pulmonary neg pulmonary ROS, former smoker,  Patient has new onset cough in past 2 days but improved today, patient attributes to post nasal drip. Patient vaccinated for covid, negative test. Lungs CTAB.    Pulmonary exam normal breath sounds clear to auscultation       Cardiovascular Exercise Tolerance: Poor hypertension, On Medications + angina with exertion + CAD, + Cardiac Stents, + CABG and +CHF  Normal cardiovascular exam(-) dysrhythmias  Rhythm:regular Rate:Normal - Systolic murmurs afib now in sinus s/p cardioversion  TTE 2021: 1. Left ventricular ejection fraction, by estimation, is 50 to 55%. The  left ventricle has low normal function. Left ventricular endocardial  border not optimally defined to evaluate regional wall motion. Left  ventricular diastolic function could not be  evaluated.  2. Right ventricular systolic function is normal. The right ventricular  size is normal. Tricuspid regurgitation signal is inadequate for assessing  PA pressure.  3. Left atrial size was mildly dilated.  4. The mitral valve is normal in structure. No evidence of mitral valve  regurgitation. No evidence of mitral stenosis.  5. The aortic valve is normal in structure. Aortic valve regurgitation is  not visualized. Mild aortic valve sclerosis is present, with no evidence  of aortic valve stenosis.  6. Mildly dilated pulmonary artery.  7. The inferior vena cava is normal in  size with greater than 50%  respiratory variability, suggesting right atrial pressure of 3 mmHg.    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 left knee prosthesis   Abdominal (+) + obese,   Peds  Hematology  (+) Blood dyscrasia, anemia ,   Anesthesia Other Findings Past Medical History: No date: A-fib (HCC) No date: Diabetes (HCC) No date: Hyperlipidemia No date: Hypertension Past Surgical History: No date: BLADDER SURGERY No date: BYPASS GRAFT No date: CARDIOVERSION No date: COLON SURGERY No date: REPLACEMENT TOTAL KNEE BILATERAL No date: STENT PLACEMENT VASCULAR ([facility] HX) BMI    Body Mass Index: 38.73 kg/m     Reproductive/Obstetrics negative OB ROS                             Anesthesia Physical  Anesthesia Plan  ASA: III  Anesthesia Plan: General/Spinal   Post-op Pain Management:    Induction: Intravenous  PONV Risk Score and Plan: 3 and Ondansetron, Dexamethasone and Treatment may vary due to age or medical condition  Airway Management Planned: Natural Airway and Oral ETT  Additional Equipment: None  Intra-op Plan:   Post-operative Plan:   Informed Consent: I have reviewed the patients History and Physical, chart, labs and discussed the procedure including the risks, benefits and alternatives for the proposed anesthesia with the patient or authorized representative who has indicated his/her understanding and acceptance.     Dental  Advisory Given  Plan Discussed with: CRNA  Anesthesia Plan Comments: (Patient had colonoscopy this morning; patient now recovered and awake and alert. Says his last pradaxa dose was Friday AM (6 days ago); per ASRA guidelines reasonable to proceed with  spinal. Patient with no systemic septic symptoms. WBC normalizing. Reasonable to proceed with spinal.  Discussed R/B/A of neuraxial anesthesia technique with patient: - risks of spinal/epidural hematoma, nerve damage, infection - Risk of PDPH - Risk of nausea and vomiting - Risk of conversion to general anesthesia and its associated risks, including sore throat, damage to lips/teeth/oropharynx, and rare risks such as cardiac and respiratory events. Patient informed about increased incidence of above perioperative risk due to high BMI. Patient understands.  )        Anesthesia Quick Evaluation

## 2020-01-16 NOTE — Consult Note (Signed)
NAME: Randall Vincent  DOB: [birth year]  MRN: [id number] Nepomucenome: 01/16/2020 5:30 PM  REQUESTING PROVIDER: Dr.Griffith Subjective:  REASON FOR CONSULT: PJI  ? Randall Vincent is a 79 y.o. male with a history of b/l TKA, DM, HTN, afib, CAD s/p CABG admitted with left knee swelling and pain and anemia and black stool Pt says he woke up 2 weeks ago with pain and swelling left knee- he managed with NSAIDs. He had gone to see his PCP on 01/10/20 for swelling both legs and also gave a history of seeing black stool . PCP did labs and as the hb was low he was asked to go to the ED.  In the Ed temp was 99.2, HR 105, BP 168/78, HB 8.0( baseline 13.5) cr 1.77, WBC 18K Left knee was aspirated don 8/21 and no cell count sent. Culture was positive for enterococcus Pt was seen by ortho and taken for surgery today and had complete explantation of hardware and an antibiotic spacer placed Pt says he never had a fever.He denies any trauma.He took ibuprofen- He is on anticoagulant- saw black stool one time He had both knees replaced in 2008 , 3 months apart. He had staph aureus infection 3 months after the surgery in the rt knee- He had capsule exchange and got 6 weeks of iV antibiotic and was on keflex for many years. The knee healed. He lives on his own  Past Medical History:  Diagnosis Date  . A-fib (HCC)   . Diabetes (HCC)   . Hyperlipidemia   . Hypertension    B/l venous edema legs  Past Surgical History:  Procedure Laterality Date  . BLADDER SURGERY    . CABG    . CARDIOVERSION    . COLON SURGERY    . COLONOSCOPY N/A 01/15/2020   Procedure: COLONOSCOPY;  Surgeon: Pasty Spillers, MD;  Location: [facility] ENDOSCOPY;  Service: Endoscopy;  Laterality: N/A;  . ESOPHAGOGASTRODUODENOSCOPY N/A 01/13/2020   Procedure: ESOPHAGOGASTRODUODENOSCOPY (EGD);  Surgeon: Pasty Spillers, MD;  Location: [facility] ENDOSCOPY;  Service: Endoscopy;  Laterality: N/A;  . REPLACEMENT TOTAL KNEE BILATERAL    . STENT PLACEMENT  VASCULAR ([facility] HX)      Social History   Socioeconomic History  . Marital status: Widowed    Spouse name: Not on file  . Number of children: Not on file  . Years of education: Not on file  . Highest education level: Not on file  Occupational History  . Not on file  Tobacco Use  . Smoking status: Former Smoker    Quit date: 1991    Years since quitting: 30.6  . Smokeless tobacco: Never Used  Substance and Sexual Activity  . Alcohol use: Not on file  . Drug use: Not on file  . Sexual activity: Not on file  Other Topics Concern  . Not on file  Social History Narrative  . Not on file   Social Determinants of Health   Financial Resource Strain:   . Difficulty of Paying Living Expenses: Not on file  Food Insecurity:   . Worried About Programme researcher, broadcasting/film/video in the Last Year: Not on file  . Ran Out of Food in the Last Year: Not on file  Transportation Needs:   . Lack of Transportation (Medical): Not on file  . Lack of Transportation (Non-Medical): Not on file  Physical Activity:   . Days of Exercise per Week: Not on file  . Minutes of Exercise per Session: Not on file  Stress:   . Feeling of Stress : Not on file  Social Connections:   . Frequency of Communication with Friends and Family: Not on file  . Frequency of Social Gatherings with Friends and Family: Not on file  . Attends Religious Services: Not on file  . Active Member of Clubs or Organizations: Not on file  . Attends Banker Meetings: Not on file  . Marital Status: Not on file  Intimate Partner Violence:   . Fear of Current or Ex-Partner: Not on file  . Emotionally Abused: Not on file  . Physically Abused: Not on file  . Sexually Abused: Not on file    No Known Allergies  FH Alzheimer's disease Father    Diabetes Father    Heart disease Maternal Grandfather    Heart disease Maternal Grandfather    COPD Mother    Diabetes Mother      ? Current Facility-Administered Medications    Medication Dose Route Frequency Provider Last Rate Last Admin  . 0.9 %  sodium chloride infusion (Manually program via Guardrails IV Fluids)   Intravenous Once Jimmye Norman, NP      . 0.9 %  sodium chloride infusion   Intravenous Continuous Andris Baumann, MD 20 mL/hr at 01/15/20 0815 Continued from Pre-op at 01/15/20 0815  . 0.9 %  sodium chloride infusion   Intravenous PRN Gertha Calkin, MD 10 mL/hr at 01/14/20 1643 500 mL at 01/14/20 1643  . acetaminophen (TYLENOL) tablet 650 mg  650 mg Oral Q6H PRN Andris Baumann, MD       Or  . acetaminophen (TYLENOL) suppository 650 mg  650 mg Rectal Q6H PRN Andris Baumann, MD      . albuterol (PROVENTIL) (2.5 MG/3ML) 0.083% nebulizer solution 3 mL  3 mL Inhalation Q6H PRN Gertha Calkin, MD      . amiodarone (PACERONE) tablet 200 mg  200 mg Oral Daily Irena Cords V, MD   200 mg at 01/14/20 0910  . atorvastatin (LIPITOR) tablet 40 mg  40 mg Oral Daily Gertha Calkin, MD   40 mg at 01/14/20 0910  . diltiazem (CARDIZEM) tablet 60 mg  60 mg Oral Q8H Gertha Calkin, MD   60 mg at 01/15/20 2350  . fluticasone (FLONASE) 50 MCG/ACT nasal spray 2 spray  2 spray Each Nare Daily PRN Gertha Calkin, MD      . HYDROcodone-acetaminophen (NORCO/VICODIN) 5-325 MG per tablet 1-2 tablet  1-2 tablet Oral Q4H PRN Andris Baumann, MD   2 tablet at 01/15/20 2351  . insulin aspart (novoLOG) injection 0-15 Units  0-15 Units Subcutaneous Q4H Andris Baumann, MD   2 Units at 01/15/20 2346  . magnesium sulfate IVPB 2 g 50 mL  2 g Intravenous Once Esaw Grandchild A, DO      . morphine 2 MG/ML injection 2 mg  2 mg Intravenous Q2H PRN Andris Baumann, MD   2 mg at 01/11/20 0905  . ondansetron (ZOFRAN) tablet 4 mg  4 mg Oral Q6H PRN Andris Baumann, MD       Or  . ondansetron [facility]) injection 4 mg  4 mg Intravenous Q6H PRN Andris Baumann, MD   4 mg at 01/11/20 0417  . piperacillin-tazobactam (ZOSYN) IVPB 3.375 g  3.375 g Intravenous STAT Lyndle Herrlich, MD      .  traMADol Janean Sark) tablet 50 mg  50 mg Oral Q6H PRN Gertha Calkin, MD      .  vitamin B-12 (CYANOCOBALAMIN) tablet 1,000 mcg  1,000 mcg Oral Daily Gertha Calkin, MD   1,000 mcg at 01/14/20 1610     Abtx:  Anti-infectives (From admission, onward)   Start     Dose/Rate Route Frequency Ordered Stop   01/16/20 1412  50,000 units bacitracin in 0.9% normal saline 250 mL irrigation  Status:  Discontinued          As needed 01/16/20 1413 01/16/20 1429   01/16/20 1300  vancomycin (VANCOREADY) IVPB 500 mg/100 mL        500 mg 100 mL/hr over 60 Minutes Intravenous  Once 01/16/20 1256 01/16/20 1400   01/16/20 1246  vancomycin (VANCOCIN) 1-5 GM/200ML-% IVPB       Note to Pharmacy: Alpha Gula   : cabinet override      01/16/20 1246 01/16/20 1303   01/16/20 1246  piperacillin-tazobactam (ZOSYN) 3.375 GM/50ML IVPB  Status:  Discontinued       Note to Pharmacy: Alpha Gula   : cabinet override      01/16/20 1246 01/16/20 1317   01/16/20 1245  vancomycin (VANCOCIN) IVPB 1000 mg/200 mL premix        1,000 mg 200 mL/hr over 60 Minutes Intravenous STAT 01/16/20 1242 01/16/20 1400   01/16/20 1245  piperacillin-tazobactam (ZOSYN) IVPB 3.375 g        3.375 g 12.5 mL/hr over 240 Minutes Intravenous STAT 01/16/20 1242 01/17/20 1245   01/13/20 1000  vancomycin (VANCOREADY) IVPB 1500 mg/300 mL  Status:  Discontinued        1,500 mg 150 mL/hr over 120 Minutes Intravenous Every 24 hours 01/13/20 0749 01/15/20 0912   01/11/20 1800  vancomycin (VANCOCIN) IVPB 1000 mg/200 mL premix  Status:  Discontinued        1,000 mg 200 mL/hr over 60 Minutes Intravenous Every 24 hours 01/11/20 0409 01/13/20 0749   01/11/20 0700  piperacillin-tazobactam (ZOSYN) IVPB 3.375 g  Status:  Discontinued        3.375 g 12.5 mL/hr over 240 Minutes Intravenous Every 8 hours 01/11/20 0048 01/15/20 0912   01/10/20 2315  piperacillin-tazobactam (ZOSYN) IVPB 3.375 g        3.375 g 100 mL/hr over 30 Minutes Intravenous  Once 01/10/20 2310  01/11/20 0026   01/10/20 2315  vancomycin (VANCOCIN) IVPB 1000 mg/200 mL premix        1,000 mg 200 mL/hr over 60 Minutes Intravenous  Once 01/10/20 2310 01/11/20 0136      REVIEW OF SYSTEMS:  Const: negative fever, negative chills, negative weight loss Eyes: negative diplopia or visual changes, negative eye pain ENT: negative coryza, negative sore throat Resp: negative cough, hemoptysis, dyspnea Cards: negative for chest pain, palpitations, lower extremity edema GU: negative for frequency, dysuria and hematuria GI: Negative for abdominal pain, diarrhea, bleeding, constipation Skin: negative for rash and pruritus Heme: negative for easy bruising and gum/nose bleeding MS: negative for myalgias, arthralgias, back pain and muscle weakness Neurolo:negative for headaches, dizziness, vertigo, memory problems  Psych: negative for feelings of anxiety, depression  Endocrine: negative for thyroid, diabetes Allergy/Immunology- negative for any medication or food allergies ? Pertinent Positives include : Objective:  VITALS:  BP 140/63 (BP Location: Right Arm)   Pulse 80   Temp 97.6 F (36.4 C)   Resp 19   Ht 5\' 8"  (1.727 m)   Wt 120.6 kg   SpO2 97%   BMI 40.43 kg/m  PHYSICAL EXAM:  General: Alert, cooperative, no distress, appears stated age.  Head: Normocephalic, without obvious abnormality, atraumatic. Eyes: Conjunctivae clear, anicteric sclerae. Pupils are equal ENT Nares normal. No drainage or sinus tenderness. Lips, mucosa, and tongue normal. No Thrush Neck: Supple, symmetrical, no adenopathy, thyroid: non tender no carotid bruit and no JVD. Back: No CVA tenderness. Lungs: Clear to auscultation bilaterally. No Wheezing or Rhonchi. No rales. Heart: Regular rate and rhythm, no murmur, rub or gallop. Abdomen: Soft, non-tender,not distended. Bowel sounds normal. No masses Extremities: atraumatic, no cyanosis. No edema. No clubbing Skin: No rashes or lesions. Or bruising Lymph:  Cervical, supraclavicular normal. Neurologic: Grossly non-focal Pertinent Labs Lab Results CBC    Component Value Date/Time   WBC 10.3 01/16/2020 0239   RBC 2.82 (L) 01/16/2020 0239   HGB 8.3 (L) 01/16/2020 0956   HCT 26.7 (L) 01/16/2020 0956   PLT 320 01/16/2020 0239   MCV 85.8 01/16/2020 0239   MCH 27.0 01/16/2020 0239   MCHC 31.4 01/16/2020 0239   RDW 17.0 (H) 01/16/2020 0239   LYMPHSABS 0.7 01/10/2020 1837   MONOABS 1.2 (H) 01/10/2020 1837   EOSABS 0.0 01/10/2020 1837   BASOSABS 0.0 01/10/2020 1837    CMP Latest Ref Rng & Units 01/16/2020 01/15/2020 01/14/2020  Glucose 70 - 99 mg/dL 818(E) 993(Z) 169(C)  BUN 8 - 23 mg/dL 14 18 22   Creatinine 0.61 - 1.24 mg/dL 7.89) 3.81(O)  Sodium 135 - 145 mmol/L 137 137 136  Potassium 3.5 - 5.1 mmol/L 3.9 4.2 4.3  Chloride 98 - 111 mmol/L 107 109 106  CO2 22 - 32 mmol/L 22 21(L) 20(L)  Calcium 8.9 - 10.3 mg/dL 7.5(L) 7.7(L) 8.4(L)      Microbiology: Recent Results (from the past 240 hour(s))  Culture, blood (routine x 2)     Status: None   Collection Time: 01/11/20 12:37 AM   Specimen: BLOOD  Result Value Ref Range Status   Specimen Description BLOOD RIGHT ANTECUBITAL  Final   Special Requests   Final    BOTTLES DRAWN AEROBIC AND ANAEROBIC Blood Culture results may not be optimal due to an excessive volume of blood received in culture bottles   Culture   Final    NO GROWTH 5 DAYS Performed at [facility], [address] [place], [place]    Report Status 01/16/2020 FINAL  Final  Culture, blood (routine x 2)     Status: None   Collection Time: 01/11/20 12:37 AM   Specimen: BLOOD  Result Value Ref Range Status   Specimen Description BLOOD LEFT ANTECUBITAL  Final   Special Requests   Final    BOTTLES DRAWN AEROBIC AND ANAEROBIC Blood Culture results may not be optimal due to an excessive volume of blood received in culture bottles   Culture   Final    NO GROWTH 5 DAYS Performed at [facility], [address] [place], [place]    Report Status 01/16/2020 FINAL  Final  SARS Coronavirus 2 by RT PCR (hospital order, performed in [facility] hospital lab) Nasopharyngeal Nasopharyngeal Swab     Status: None   Collection Time: 01/11/20  6:14 AM   Specimen: Nasopharyngeal Swab  Result Value Ref Range Status   SARS Coronavirus 2 NEGATIVE NEGATIVE Final    Comment: (NOTE) SARS-CoV-2 target nucleic acids are NOT DETECTED.  The SARS-CoV-2 RNA is generally detectable in upper and lower respiratory specimens during the acute phase of infection. The lowest concentration of SARS-CoV-2 viral copies this assay can detect is 250 copies / mL.  A negative result does not preclude SARS-CoV-2 infection and should not be used as the sole basis for treatment or other patient management decisions.  A negative result may occur with improper specimen collection / handling, submission of specimen other than nasopharyngeal swab, presence of viral mutation(s) within the areas targeted by this assay, and inadequate number of viral copies (<250 copies / mL). A negative result must be combined with clinical observations, patient history, and epidemiological information.  Fact Sheet for Patients:   BoilerBrush.com.cyhttps://www.fda.gov/media/136312/download  Fact Sheet for Healthcare Providers: https://pope.com/https://www.fda.gov/media/136313/download  This test is not yet approved or  cleared by the [place] FDA and has been authorized for detection and/or diagnosis of SARS-CoV-2 by FDA under an Emergency Use Authorization (EUA).  This EUA will remain in effect (meaning this test can be used) for the duration of the COVID-19 declaration under Section 564(b)(1) of the Act, 21 U.S.C. section 360bbb-3(b)(1), unless the authorization is terminated or revoked sooner.  Performed at [facility], [address] [place], [place] [address]   Culture, body fluid-bottle     Status: Abnormal   Collection Time:  01/11/20  6:07 PM   Specimen: Fluid  Result Value Ref Range Status   Specimen Description FLUID KNEE  Final   Special Requests   Final    BOTTLES DRAWN AEROBIC AND ANAEROBIC Blood Culture adequate volume   Gram Stain   Final    GRAM POSITIVE COCCI IN CLUSTERS ANAEROBIC BOTTLE ONLY CRITICAL RESULT CALLED TO, READ BACK BY AND VERIFIED WITH: RN Sheria LangAMERON [id number] FCP NO ORGANISMS SEEN AEROBIC BOTTLE ONLY Performed at [facility], [address] [place], [place] [address]    Culture ENTEROCOCCUS FAECALIS (A)  Final   Report Status 01/15/2020 FINAL  Final   Organism ID, Bacteria ENTEROCOCCUS FAECALIS  Final      Susceptibility   Enterococcus faecalis - MIC*    AMPICILLIN <=2 SENSITIVE Sensitive     VANCOMYCIN 1 SENSITIVE Sensitive     GENTAMICIN SYNERGY SENSITIVE Sensitive     * ENTEROCOCCUS FAECALIS  Gram stain     Status: None   Collection Time: 01/11/20  6:07 PM   Specimen: Fluid  Result Value Ref Range Status   Specimen Description FLUID KNEE  Final   Special Requests NONE  Final   Gram Stain   Final    FEW WBC PRESENT,BOTH PMN AND MONONUCLEAR NO ORGANISMS SEEN Performed at [facility], [address] [place], [place] [address]    Report Status 01/12/2020 FINAL  Final  Aerobic/Anaerobic Culture (surgical/deep wound)     Status: None (Preliminary result)   Collection Time: 01/16/20 12:41 PM   Specimen: PATH Cytology Misc. fluid; Body Fluid  Result Value Ref Range Status   Specimen Description TISSUE  Final   Special Requests   Final    LEFT KNEE TISSUE Performed at [facility], [address] [place], [place] [address]    Gram Stain PENDING  Incomplete   Culture PENDING  Incomplete   Report Status PENDING  Incomplete    IMAGING RESULTS: I have personally reviewed the films ? Impression/Recommendation ? ? ? ___________________________________________________ Discussed with patient, requesting provider Note:  This document was  prepared using Dragon voice recognition software and may include unintentional dictation errors. NAME: Randall Vincent  DOB: 06/08/1940  MRN: [id number]  Date/Time: 01/16/2020 5:30 PM  REQUESTING PROVIDER Subjective:  REASON FOR CONSULT:  ? Randall HaysGeorge Dombeck is a 79 y.o. with a history of  Past Medical History:  Diagnosis Date  . A-fib (HCC)   . Diabetes (HCC)   . Hyperlipidemia   . Hypertension     Past Surgical History:  Procedure Laterality Date  . BLADDER SURGERY    . BYPASS GRAFT    . CARDIOVERSION    . COLON SURGERY    . COLONOSCOPY N/A 01/15/2020   Procedure: COLONOSCOPY;  Surgeon: Pasty Spillers, MD;  Location: [facility] ENDOSCOPY;  Service: Endoscopy;  Laterality: N/A;  . ESOPHAGOGASTRODUODENOSCOPY N/A 01/13/2020   Procedure: ESOPHAGOGASTRODUODENOSCOPY (EGD);  Surgeon: Pasty Spillers, MD;  Location: [facility] ENDOSCOPY;  Service: Endoscopy;  Laterality: N/A;  . REPLACEMENT TOTAL KNEE BILATERAL    . STENT PLACEMENT VASCULAR ([facility] HX)      Social History   Socioeconomic History  . Marital status: Widowed    Spouse name: Not on file  . Number of children: Not on file  . Years of education: Not on file  . Highest education level: Not on file  Occupational History  . Not on file  Tobacco Use  . Smoking status: Former Smoker    Quit date: 1991    Years since quitting: 30.6  . Smokeless tobacco: Never Used  Substance and Sexual Activity  . Alcohol use: Not on file  . Drug use: Not on file  . Sexual activity: Not on file  Other Topics Concern  . Not on file  Social History Narrative  . Not on file   Social Determinants of Health   Financial Resource Strain:   . Difficulty of Paying Living Expenses: Not on file  Food Insecurity:   . Worried About Programme researcher, broadcasting/film/video in the Last Year: Not on file  . Ran Out of Food in the Last Year: Not on file  Transportation Needs:   . Lack of Transportation (Medical): Not on file  . Lack of Transportation (Non-Medical): Not on  file  Physical Activity:   . Days of Exercise per Week: Not on file  . Minutes of Exercise per Session: Not on file  Stress:   . Feeling of Stress : Not on file  Social Connections:   . Frequency of Communication with Friends and Family: Not on file  . Frequency of Social Gatherings with Friends and Family: Not on file  . Attends Religious Services: Not on file  . Active Member of Clubs or Organizations: Not on file  . Attends Banker Meetings: Not on file  . Marital Status: Not on file  Intimate Partner Violence:   . Fear of Current or Ex-Partner: Not on file  . Emotionally Abused: Not on file  . Physically Abused: Not on file  . Sexually Abused: Not on file    History reviewed. No pertinent family history. No Known Allergies ID  Recent  Procedure Surgery Injections Trauma Sick contacts Travel Antibiotic use Food- raw/exotic Steroid/immune suppressants/splenectomy/Hardware Animal bites Tick exposure Water sports Fishing/hunting/animal bird exposure ? Current Facility-Administered Medications  Medication Dose Route Frequency Provider Last Rate Last Admin  . 0.9 %  sodium chloride infusion (Manually program via Guardrails IV Fluids)   Intravenous Once Jimmye Norman, NP      . 0.9 %  sodium chloride infusion   Intravenous Continuous Andris Baumann, MD 20 mL/hr at 01/15/20 0815 Continued from Pre-op at 01/15/20 0815  . 0.9 %  sodium chloride infusion   Intravenous PRN Gertha Calkin, MD 10 mL/hr at 01/14/20 1643 500 mL at 01/14/20 1643  . acetaminophen (TYLENOL)  tablet 650 mg  650 mg Oral Q6H PRN Andris Baumann, MD       Or  . acetaminophen (TYLENOL) suppository 650 mg  650 mg Rectal Q6H PRN Andris Baumann, MD      . albuterol (PROVENTIL) (2.5 MG/3ML) 0.083% nebulizer solution 3 mL  3 mL Inhalation Q6H PRN Gertha Calkin, MD      . amiodarone (PACERONE) tablet 200 mg  200 mg Oral Daily Irena Cords V, MD   200 mg at 01/14/20 0910  . atorvastatin  (LIPITOR) tablet 40 mg  40 mg Oral Daily Gertha Calkin, MD   40 mg at 01/14/20 0910  . diltiazem (CARDIZEM) tablet 60 mg  60 mg Oral Q8H Gertha Calkin, MD   60 mg at 01/15/20 2350  . fluticasone (FLONASE) 50 MCG/ACT nasal spray 2 spray  2 spray Each Nare Daily PRN Gertha Calkin, MD      . HYDROcodone-acetaminophen (NORCO/VICODIN) 5-325 MG per tablet 1-2 tablet  1-2 tablet Oral Q4H PRN Andris Baumann, MD   2 tablet at 01/15/20 2351  . insulin aspart (novoLOG) injection 0-15 Units  0-15 Units Subcutaneous Q4H Andris Baumann, MD   2 Units at 01/15/20 2346  . magnesium sulfate IVPB 2 g 50 mL  2 g Intravenous Once Esaw Grandchild A, DO      . morphine 2 MG/ML injection 2 mg  2 mg Intravenous Q2H PRN Andris Baumann, MD   2 mg at 01/11/20 0905  . ondansetron (ZOFRAN) tablet 4 mg  4 mg Oral Q6H PRN Andris Baumann, MD       Or  . ondansetron [facility]) injection 4 mg  4 mg Intravenous Q6H PRN Andris Baumann, MD   4 mg at 01/11/20 0417  . piperacillin-tazobactam (ZOSYN) IVPB 3.375 g  3.375 g Intravenous STAT Lyndle Herrlich, MD      . traMADol Janean Sark) tablet 50 mg  50 mg Oral Q6H PRN Gertha Calkin, MD      . vitamin B-12 (CYANOCOBALAMIN) tablet 1,000 mcg  1,000 mcg Oral Daily Gertha Calkin, MD   1,000 mcg at 01/14/20 5784     Abtx:  Anti-infectives (From admission, onward)   Start     Dose/Rate Route Frequency Ordered Stop   01/16/20 1412  50,000 units bacitracin in 0.9% normal saline 250 mL irrigation  Status:  Discontinued          As needed 01/16/20 1413 01/16/20 1429   01/16/20 1300  vancomycin (VANCOREADY) IVPB 500 mg/100 mL        500 mg 100 mL/hr over 60 Minutes Intravenous  Once 01/16/20 1256 01/16/20 1400   01/16/20 1246  vancomycin (VANCOCIN) 1-5 GM/200ML-% IVPB       Note to Pharmacy: Alpha Gula   : cabinet override      01/16/20 1246 01/16/20 1303   01/16/20 1246  piperacillin-tazobactam (ZOSYN) 3.375 GM/50ML IVPB  Status:  Discontinued       Note to Pharmacy: Alpha Gula   :  cabinet override      01/16/20 1246 01/16/20 1317   01/16/20 1245  vancomycin (VANCOCIN) IVPB 1000 mg/200 mL premix        1,000 mg 200 mL/hr over 60 Minutes Intravenous STAT 01/16/20 1242 01/16/20 1400   01/16/20 1245  piperacillin-tazobactam (ZOSYN) IVPB 3.375 g        3.375 g 12.5 mL/hr over 240 Minutes Intravenous STAT 01/16/20 1242 01/17/20 1245   01/13/20  1000  vancomycin (VANCOREADY) IVPB 1500 mg/300 mL  Status:  Discontinued        1,500 mg 150 mL/hr over 120 Minutes Intravenous Every 24 hours 01/13/20 0749 01/15/20 0912   01/11/20 1800  vancomycin (VANCOCIN) IVPB 1000 mg/200 mL premix  Status:  Discontinued        1,000 mg 200 mL/hr over 60 Minutes Intravenous Every 24 hours 01/11/20 0409 01/13/20 0749   01/11/20 0700  piperacillin-tazobactam (ZOSYN) IVPB 3.375 g  Status:  Discontinued        3.375 g 12.5 mL/hr over 240 Minutes Intravenous Every 8 hours 01/11/20 0048 01/15/20 0912   01/10/20 2315  piperacillin-tazobactam (ZOSYN) IVPB 3.375 g        3.375 g 100 mL/hr over 30 Minutes Intravenous  Once 01/10/20 2310 01/11/20 0026   01/10/20 2315  vancomycin (VANCOCIN) IVPB 1000 mg/200 mL premix        1,000 mg 200 mL/hr over 60 Minutes Intravenous  Once 01/10/20 2310 01/11/20 0136      REVIEW OF SYSTEMS:  Const: negative fever, negative chills, negative weight loss Eyes: negative diplopia or visual changes, negative eye pain ENT: negative coryza, negative sore throat Resp: cough, dyspnea Cards: negative for chest pain, palpitations, has  lower extremity edema GU: negative for frequency, dysuria and hematuria GI: Negative for abdominal pain, diarrhea, black stool + no, constipation Skin: negative for rash and pruritus Heme: negative for easy bruising and gum/nose bleeding MS: weakness, left knee pain Neurolo:negative for headaches, dizziness, vertigo, memory problems  Psych: negative for feelings of anxiety, depression  Endocrine:  diabetes Allergy/Immunology- negative  for any medication or food allergies ? Objective:  VITALS:  BP 140/63 (BP Location: Right Arm)   Pulse 80   Temp 97.6 F (36.4 C)   Resp 19   Ht 5\' 8"  (1.727 m)   Wt 120.6 kg   SpO2 97%   BMI 40.43 kg/m  PHYSICAL EXAM:  General: Alert, cooperative, no distress, appears stated age. obese Head: Normocephalic, without obvious abnormality, atraumatic. Eyes: Conjunctivae clear, anicteric sclerae. Pupils are equal ENT Nares normal. No drainage or sinus tenderness. Lips, mucosa, and tongue normal. No Thrush Neck: Supple, symmetrical, no adenopathy, thyroid: non tender no carotid bruit and no JVD. Back: No CVA tenderness. Lungs: b/l air entry- few crepts base Heart: irrgeular Abdomen: obese, soft distension Extremities: rt knee scar Rt leg venous edema Pigmentation Left leg dressing Skin: bruising skin arms Lymph: Cervical, supraclavicular normal. Neurologic: Grossly non-focal Pertinent Labs Lab Results CBC    Component Value Date/Time   WBC 10.3 01/16/2020 0239   RBC 2.82 (L) 01/16/2020 0239   HGB 8.3 (L) 01/16/2020 0956   HCT 26.7 (L) 01/16/2020 0956   PLT 320 01/16/2020 0239   MCV 85.8 01/16/2020 0239   MCH 27.0 01/16/2020 0239   MCHC 31.4 01/16/2020 0239   RDW 17.0 (H) 01/16/2020 0239   LYMPHSABS 0.7 01/10/2020 1837   MONOABS 1.2 (H) 01/10/2020 1837   EOSABS 0.0 01/10/2020 1837   BASOSABS 0.0 01/10/2020 1837    CMP Latest Ref Rng & Units 01/16/2020 01/15/2020 01/14/2020  Glucose 70 - 99 mg/dL 469(G) 295(M) 841(L)  BUN 8 - 23 mg/dL 14 18 22   Creatinine 0.61 - 1.24 mg/dL 2.44 0.10(U) 7.25(D)  Sodium 135 - 145 mmol/L 137 137 136  Potassium 3.5 - 5.1 mmol/L 3.9 4.2 4.3  Chloride 98 - 111 mmol/L 107 109 106  CO2 22 - 32 mmol/L 22 21(L) 20(L)  Calcium 8.9 - 10.3  mg/dL 7.5(L) 7.7(L) 8.4(L)      Microbiology: Recent Results (from the past 240 hour(s))  Culture, blood (routine x 2)     Status: None   Collection Time: 01/11/20 12:37 AM   Specimen: BLOOD  Result  Value Ref Range Status   Specimen Description BLOOD RIGHT ANTECUBITAL  Final   Special Requests   Final    BOTTLES DRAWN AEROBIC AND ANAEROBIC Blood Culture results may not be optimal due to an excessive volume of blood received in culture bottles   Culture   Final    NO GROWTH 5 DAYS Performed at [facility], [address] [place], [place] [address]    Report Status 01/16/2020 FINAL  Final  Culture, blood (routine x 2)     Status: None   Collection Time: 01/11/20 12:37 AM   Specimen: BLOOD  Result Value Ref Range Status   Specimen Description BLOOD LEFT ANTECUBITAL  Final   Special Requests   Final    BOTTLES DRAWN AEROBIC AND ANAEROBIC Blood Culture results may not be optimal due to an excessive volume of blood received in culture bottles   Culture   Final    NO GROWTH 5 DAYS Performed at [facility], [address] [place], [place] [address]    Report Status 01/16/2020 FINAL  Final  SARS Coronavirus 2 by RT PCR (hospital order, performed in [facility] hospital lab) Nasopharyngeal Nasopharyngeal Swab     Status: None   Collection Time: 01/11/20  6:14 AM   Specimen: Nasopharyngeal Swab  Result Value Ref Range Status   SARS Coronavirus 2 NEGATIVE NEGATIVE Final    Comment: (NOTE) SARS-CoV-2 target nucleic acids are NOT DETECTED.  The SARS-CoV-2 RNA is generally detectable in upper and lower respiratory specimens during the acute phase of infection. The lowest concentration of SARS-CoV-2 viral copies this assay can detect is 250 copies / mL. A negative result does not preclude SARS-CoV-2 infection and should not be used as the sole basis for treatment or other patient management decisions.  A negative result may occur with improper specimen collection / handling, submission of specimen other than nasopharyngeal swab, presence of viral mutation(s) within the areas targeted by this assay, and inadequate number of viral copies (<250 copies / mL). A negative  result must be combined with clinical observations, patient history, and epidemiological information.  Fact Sheet for Patients:   BoilerBrush.com.cy  Fact Sheet for Healthcare Providers: https://pope.com/  This test is not yet approved or  cleared by the [place] FDA and has been authorized for detection and/or diagnosis of SARS-CoV-2 by FDA under an Emergency Use Authorization (EUA).  This EUA will remain in effect (meaning this test can be used) for the duration of the COVID-19 declaration under Section 564(b)(1) of the Act, 21 U.S.C. section 360bbb-3(b)(1), unless the authorization is terminated or revoked sooner.  Performed at [facility], [address] [place], [place] [address]   Culture, body fluid-bottle     Status: Abnormal   Collection Time: 01/11/20  6:07 PM   Specimen: Fluid  Result Value Ref Range Status   Specimen Description FLUID KNEE  Final   Special Requests   Final    BOTTLES DRAWN AEROBIC AND ANAEROBIC Blood Culture adequate volume   Gram Stain   Final    GRAM POSITIVE COCCI IN CLUSTERS ANAEROBIC BOTTLE ONLY CRITICAL RESULT CALLED TO, READ BACK BY AND VERIFIED WITH: RN Sheria Lang [id number] FCP NO ORGANISMS SEEN AEROBIC BOTTLE ONLY Performed  at [facility], [address] [place], [place] [address]    Culture ENTEROCOCCUS FAECALIS (A)  Final   Report Status 01/15/2020 FINAL  Final   Organism ID, Bacteria ENTEROCOCCUS FAECALIS  Final      Susceptibility   Enterococcus faecalis - MIC*    AMPICILLIN <=2 SENSITIVE Sensitive     VANCOMYCIN 1 SENSITIVE Sensitive     GENTAMICIN SYNERGY SENSITIVE Sensitive     * ENTEROCOCCUS FAECALIS  Gram stain     Status: None   Collection Time: 01/11/20  6:07 PM   Specimen: Fluid  Result Value Ref Range Status   Specimen Description FLUID KNEE  Final   Special Requests NONE  Final   Gram Stain   Final    FEW WBC PRESENT,BOTH PMN AND  MONONUCLEAR NO ORGANISMS SEEN Performed at [facility], [address] [place], [place] [address]    Report Status 01/12/2020 FINAL  Final  Aerobic/Anaerobic Culture (surgical/deep wound)     Status: None (Preliminary result)   Collection Time: 01/16/20 12:41 PM   Specimen: PATH Cytology Misc. fluid; Body Fluid  Result Value Ref Range Status   Specimen Description TISSUE  Final   Special Requests   Final    LEFT KNEE TISSUE Performed at [facility], [address], [place], [place] [address]    Gram Stain PENDING  Incomplete   Culture PENDING  Incomplete   Report Status PENDING  Incomplete    IMAGING RESULTS:  I have personally reviewed the films ? Impression/Recommendation ? 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 Blood culture neg Depending on culture result the antibiotics can be deescalated to ampicillin?? Also watch closely for cr. If worsening then switch vanco to ampicillin. Enterococcus is not a common bacteria to cause PJI infection. Wonder wether he will need TEE , even if  blood culture is negative Or was the enterococcus a translocation from GI tract especially with him having GI bleed .   Anemia - secondary to GI bleed ( likely chronic) had taken NSAIDs when he had th knee pain. Also on pradaxa  Afib- on amiodarone, pradaxa  AkI- watch closely while on  vanco. B/l venous edema of legs  Rt TKA with h/o staph infection in 2008 - treated without removing hardware and was on oral antibiotics ( after IV)  for many years  H/o CABG ? ___________________________________________________ Discussed with patient Dr.Fitzgerald will follow him from tomorrow

## 2020-01-16 NOTE — Progress Notes (Signed)
Notified GI about pt output this shift for bowel prep for procedure this am. Pt is unwilling to proceed with further efforts to clear bowel. NP and GI made aware of this.

## 2020-01-16 NOTE — Progress Notes (Signed)
PT Cancellation Note  Patient Details Name: Randall Vincent MRN: [id number] DOB: 10-30-1940   Cancelled Treatment:    Reason Eval/Treat Not Completed: Patient at procedure or test/unavailable Pt out of room essentially all day having septic L knee spacer placed.  Will need new PT orders.  Will likely re-eval tomorrow per new ortho orders re: activity/WBing/etc.  Malachi Pro, DPT 01/16/2020, 3:26 PM

## 2020-01-16 NOTE — Anesthesia Procedure Notes (Signed)
Procedure Name: Intubation Date/Time: 01/16/2020 11:54 AM Performed by: Henrietta Hoover, CRNA Pre-anesthesia Checklist: Patient identified, Emergency Drugs available, Suction available and Patient being monitored Patient Re-evaluated:Patient Re-evaluated prior to induction Oxygen Delivery Method: Circle system utilized Preoxygenation: Pre-oxygenation with 100% oxygen Induction Type: IV induction, Cricoid Pressure applied and Rapid sequence Ventilation: Mask ventilation without difficulty Laryngoscope Size: McGraph and 4 Grade View: Grade I Tube type: Oral Tube size: 7.0 mm Number of attempts: 1 Airway Equipment and Method: Stylet Placement Confirmation: ETT inserted through vocal cords under direct vision,  positive ETCO2 and breath sounds checked- equal and bilateral Secured at: 21 cm Tube secured with: Tape Dental Injury: Teeth and Oropharynx as per pre-operative assessment

## 2020-01-16 NOTE — Care Plan (Signed)
Procedure completed - colonoscopy Patient tolerated well MD into room to discuss results with patient Report called to RN AVSS

## 2020-01-16 NOTE — Progress Notes (Signed)
[name], MD [address], [place], [place], [address] [address], [place], [place], [address] Phone: [phone]  Fax: [phone]   Subjective: Pt drank complete prep last night. No melena or hematochezia   Objective: Exam: Vital signs in last 24 hours: Vitals:   01/16/20 0232 01/16/20 0250 01/16/20 0400 01/16/20 0741  BP:   (!) 175/79 (!) 162/62  Pulse:   76 81  Resp: (!) 21 (!) 21 (!) 25 20  Temp:   97.8 F (36.6 C) (!) 97.4 F (36.3 C)  TempSrc:   Oral Tympanic  SpO2:   99% 100%  Weight:   120.6 kg   Height:       Weight change:   Intake/Output Summary (Last 24 hours) at 01/16/2020 0813 Last data filed at 01/16/2020 0600 Gross per 24 hour  Intake 7271.8 ml  Output 4 ml  Net 7267.8 ml    General: No acute distress, AAO x3 Abd: Soft, NT/ND, No HSM Skin: Warm, no rashes Neck: Supple, Trachea midline   Lab Results: Lab Results  Component Value Date   WBC 10.3 01/16/2020   HGB 7.6 (L) 01/16/2020   HCT 24.2 (L) 01/16/2020   MCV 85.8 01/16/2020   PLT 320 01/16/2020   Micro Results: Recent Results (from the past 240 hour(s))  Culture, blood (routine x 2)     Status: None   Collection Time: 01/11/20 12:37 AM   Specimen: BLOOD  Result Value Ref Range Status   Specimen Description BLOOD RIGHT ANTECUBITAL  Final   Special Requests   Final    BOTTLES DRAWN AEROBIC AND ANAEROBIC Blood Culture results may not be optimal due to an excessive volume of blood received in culture bottles   Culture   Final    NO GROWTH 5 DAYS Performed at [facility], [address] [place], [place] [address]    Report Status 01/16/2020 FINAL  Final  Culture, blood (routine x 2)     Status: None   Collection Time: 01/11/20 12:37 AM   Specimen: BLOOD  Result Value Ref Range Status   Specimen Description BLOOD LEFT ANTECUBITAL  Final   Special Requests   Final    BOTTLES DRAWN AEROBIC AND ANAEROBIC Blood Culture results may not be  optimal due to an excessive volume of blood received in culture bottles   Culture   Final    NO GROWTH 5 DAYS Performed at [facility], [address] [place], [place] [address]    Report Status 01/16/2020 FINAL  Final  SARS Coronavirus 2 by RT PCR (hospital order, performed in [facility] hospital lab) Nasopharyngeal Nasopharyngeal Swab     Status: None   Collection Time: 01/11/20  6:14 AM   Specimen: Nasopharyngeal Swab  Result Value Ref Range Status   SARS Coronavirus 2 NEGATIVE NEGATIVE Final    Comment: (NOTE) SARS-CoV-2 target nucleic acids are NOT DETECTED.  The SARS-CoV-2 RNA is generally detectable in upper and lower respiratory specimens during the acute phase of infection. The lowest concentration of SARS-CoV-2 viral copies this assay can detect is 250 copies / mL. A negative result does not preclude SARS-CoV-2 infection and should not be used as the sole basis for treatment or other patient management decisions.  A negative result may occur with improper specimen collection / handling, submission of specimen other than nasopharyngeal swab, presence of viral mutation(s) within the areas targeted by this assay, and inadequate number of viral copies (<250 copies / mL).  A negative result must be combined with clinical observations, patient history, and epidemiological information.  Fact Sheet for Patients:   BoilerBrush.com.cy  Fact Sheet for Healthcare Providers: https://pope.com/  This test is not yet approved or  cleared by the [place] FDA and has been authorized for detection and/or diagnosis of SARS-CoV-2 by FDA under an Emergency Use Authorization (EUA).  This EUA will remain in effect (meaning this test can be used) for the duration of the COVID-19 declaration under Section 564(b)(1) of the Act, 21 U.S.C. section 360bbb-3(b)(1), unless the authorization is terminated or revoked sooner.  Performed  at [facility], [address] [place], [place] [address]   Culture, body fluid-bottle     Status: Abnormal   Collection Time: 01/11/20  6:07 PM   Specimen: Fluid  Result Value Ref Range Status   Specimen Description FLUID KNEE  Final   Special Requests   Final    BOTTLES DRAWN AEROBIC AND ANAEROBIC Blood Culture adequate volume   Gram Stain   Final    GRAM POSITIVE COCCI IN CLUSTERS ANAEROBIC BOTTLE ONLY CRITICAL RESULT CALLED TO, READ BACK BY AND VERIFIED WITH: RN Sheria Lang [id number] FCP NO ORGANISMS SEEN AEROBIC BOTTLE ONLY Performed at [facility], [address], [place], [place] [address]    Culture ENTEROCOCCUS FAECALIS (A)  Final   Report Status 01/15/2020 FINAL  Final   Organism ID, Bacteria ENTEROCOCCUS FAECALIS  Final      Susceptibility   Enterococcus faecalis - MIC*    AMPICILLIN <=2 SENSITIVE Sensitive     VANCOMYCIN 1 SENSITIVE Sensitive     GENTAMICIN SYNERGY SENSITIVE Sensitive     * ENTEROCOCCUS FAECALIS  Gram stain     Status: None   Collection Time: 01/11/20  6:07 PM   Specimen: Fluid  Result Value Ref Range Status   Specimen Description FLUID KNEE  Final   Special Requests NONE  Final   Gram Stain   Final    FEW WBC PRESENT,BOTH PMN AND MONONUCLEAR NO ORGANISMS SEEN Performed at [facility], [address], [place], [place] [address]    Report Status 01/12/2020 FINAL  Final   Studies/Results: No results found. Medications:  Scheduled Meds: . [MAR Hold] sodium chloride   Intravenous Once  . [MAR Hold] amiodarone  200 mg Oral Daily  . [MAR Hold] atorvastatin  40 mg Oral Daily  . [MAR Hold] diltiazem  60 mg Oral Q8H  . [MAR Hold] insulin aspart  0-15 Units Subcutaneous Q4H  . [MAR Hold] pantoprazole  40 mg Intravenous Q12H  . [MAR Hold] vitamin B-12  1,000 mcg Oral Daily   Continuous Infusions: . sodium chloride 20 mL/hr at 01/15/20 0815  . [MAR Hold] sodium chloride 500 mL (01/14/20 1643)  . sodium chloride       PRN Meds:.[MAR Hold] sodium chloride, [MAR Hold] acetaminophen **OR** [MAR Hold] acetaminophen, [MAR Hold] albuterol, [MAR Hold] fluticasone, [MAR Hold] HYDROcodone-acetaminophen, [MAR Hold]  morphine injection, [MAR Hold] ondansetron **OR** [MAR Hold] ondansetron (ZOFRAN) IV, [MAR Hold] traMADol   Assessment: Principal Problem:   Melena Active Problems:   CAD, multiple vessel   S/P CABG x 2   Atrial fibrillation status post cardioversion 10/22/19 (HCC)   Essential hypertension   DM (diabetes mellitus), type 2 (HCC)   Symptomatic anemia   Acute blood loss anemia   Suspect Septic arthritis (HCC)   Chronic anticoagulation   History of bilateral knee replacement   Chronic kidney disease, stage 3a  Acute on chronic congestive heart failure (HCC)   Elevated troponin   Intestinal bypass or anastomosis status    Plan: Proceed with repeat colonoscopy today for evaluation of anemia and melena  I have discussed alternative options, risks & benefits,  which include, but are not limited to, bleeding, infection, perforation,respiratory complication & drug reaction.  The patient agrees with this plan & written consent will be obtained.      LOS: 6 days   [name], MD 01/16/2020, 8:13 AM

## 2020-01-16 NOTE — Care Management Important Message (Signed)
Important Message  Patient Details  Name: Randall Vincent MRN: [id number] Date of Birth: 17-Nov-1940   Medicare Important Message Given:  Yes     [name] 01/16/2020, 3:18 PM

## 2020-01-16 NOTE — Progress Notes (Signed)
PROGRESS NOTE    Randall Vincent   BJS:[id number]  DOB: 25-Aug-1940  PCP: Elspeth Cho., MD    DOA: 01/10/2020 LOS: 6   Brief Narrative   Randall Vincent is a 79 y.o. male who complains of left knee pain and swelling for the last few days.  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  He has never had problems with the left knee until recently.  He was seen in our office at [facility] earlier this week complaining of pain and swelling.  He was scheduled for a bone scan to evaluate him for prosthetic loosening versus infection by Dr. Odis Luster.  However he developed GI bleeding in the last day or so and has presented to the emergency room for this reason primarily.  Hemoglobin is down to 7.2 from a high of 13.5 earlier this year.  White blood count is 18,100.  Temperature is about 100 F.  The left knee pain and swelling was noted as well as well as a slightly elevated white count and mild fever.  He had cardioversion in June and July and has remained in sinus rhythm he says.  He was switched from Eliquis to Pradaxa at that time and took his last Pradaxa dose yesterday.       Assessment & Plan   Principal Problem:   Melena Active Problems:   CAD, multiple vessel   S/P CABG x 2   Atrial fibrillation status post cardioversion 10/22/19 [facility])   Essential hypertension   DM (diabetes mellitus), type 2 (HCC)   Symptomatic anemia   Acute blood loss anemia   Suspect Septic arthritis (HCC)   Chronic anticoagulation   History of bilateral knee replacement   Chronic kidney disease, stage 3a   Acute on chronic congestive heart failure (HCC)   Elevated troponin   Intestinal bypass or anastomosis status   Acute blood loss anemiasecondary GI bleeding - presented with melena in setting of anticoagulation.  EGD on 8/23 showed possible Barrett's esophagus changes  (biopsy contraindicated with bleeding), erosive gastropathy but no signs of recent of active bleeding.  Colonoscopy on 8/25 showed diverticulosis at the colonic anastamosis, patent surgical anastamosis, non-bleeding internal hemorrhoids.   Hbg trend: lowest was 6.4 on 8/21. Yest PM 9.2 >> this AM 7.6 >> 8.3 (1 unit RBC's given in surgery)  --GI following - follow up in clinic in 2-4 weeks -Hold pradaxa and all blood thinners for now --d/c IV Protonix --not on PPI at home, so will not start at this time, consider in outpatient setting if indicated -Serial H&H's q8h --Transfuse if Hbg < 7.0 or active bleeding -Monitor closely. Currently hemodynamically stable.  Page GI if active bleeding or unstable.   Suspect Septic arthritis / History of bilateral knee replacement - POA.  History of knee replacement several years ago presenting with a several week history of worsening pain and swelling of the right knee.   --Ortho is consulted --Had surgery today 8/26, removed L knee prosthesis and placed antibiotic spacer --ID is consulted --HOLD antibiotics until operative cultures obtained   Mild acute on chronic systolic CHF - presented with mildshortness of breathabove baseline, pulmonary vascular congestion on chest x-ray, BNP elevated at 400, all consistent with decompensated CHF (but SOB could be due to anemia as well).  No prior echo's at time of admission. Echo on 8/21 showed EF  50-55%, diastolic parameters not evaluated.   -Holding beta-blocker and ACE inhibitor in the setting of acute GI bleed and concern for hypotension -Low-dose Lasix only for overtly symptomatic dyspnea(concern for hypotension with GI bleeding) Appears euvolemic on exam and no further complaint of SOB.   Elevated troponin - POA, secondary to demand ischemia in setting of anemia and renal failure reducing clearance.   --Monitor for chest pain and get stat EKG and troponin as needed.  CAD, multiple vessel, S/P CABG x  2 - stable.   -Holding aspirin and metoprolol for now  -Consider cardiology consult  --Continue statin, aspirin held, continue diltiazem as BP tolerates --Hold Aldactone due to renal failure  Atrial fibrillation status post cardioversion 10/22/19.  Currently in sinus rhythm.  Recently switched from Pradaxa to Eliquis on 12/18/2019 due to concern for renal function.  Pradaxa discontinued for now due to GI bleeding.  Essential hypertension - chronic.  Continued on home Diltiazem for A-fib and BP controlled.  Hold ramipril and aldactione due to AKI.  Monitor BP closely.  Type 2 Diabetes - well controlled, A1C is 6.7%.   -CBG's every 4 while n.p.o. with sliding scale coverage. --Home regimen is weekly Ozempic only (takes injection on Thursdays).  --Metformin previously discontinued due to recent renal insufficiency.  Acute kidney injury - POA, improving.  Likely pre-renal in setting of bleeding and hypotension.  Avoid nephrotoxins and hypotension.  Renally dose meds as indicated.   GFR at baseline. Creatinine trends as follows: 1.77 >> 1.79 >> 1.71 >> 1.47 >> 1.55 >> 1.31 >> 1.21 today  Obesity: Body mass index is 40.43 kg/m.  Complicates overall care and prognosis.  DVT prophylaxis: SCDs Start: 01/10/20 2307   Diet:  Diet Orders (From admission, onward)    Start     Ordered   01/16/20 0001  Diet NPO time specified  Diet effective midnight        01/15/20 1748            Code Status: Full Code    Subjective 01/16/20    Patient seen in PACU this afternoon.  Had colonoscopy and surgery today.  He reports feeling overall well.  Says L knee hurting and asks for pain medicine.  Denies any other complaints including fever/chills, chest pain, SOB, N/V.   Disposition Plan & Communication   Status is: Inpatient  Remains inpatient appropriate because:Ongoing diagnostic testing needed not appropriate for outpatient work up   Dispo: The patient is from: Home              Anticipated  d/c is to: Home              Anticipated d/c date is: 2 days              Patient currently is not medically stable to d/c.        Family Communication: son updated at bedside during encounter 8.25   Consults, Procedures, Significant Events   Consultants:   Orthopedics  Infectious Disease  Procedures:   EGD  Colonoscopy   Objective   Vitals:   01/16/20 0232 01/16/20 0250 01/16/20 0400 01/16/20 0741  BP:   (!) 175/79 (!) 162/62  Pulse:   76 81  Resp: (!) 21 (!) 21 (!) 25 20  Temp:   97.8 F (36.6 C) (!) 97.4 F (36.3 C)  TempSrc:   Oral Tympanic  SpO2:   99% 100%  Weight:   120.6 kg   Height:  Intake/Output Summary (Last 24 hours) at 01/16/2020 0755 Last data filed at 01/16/2020 0600 Gross per 24 hour  Intake 7271.8 ml  Output 4 ml  Net 7267.8 ml   Filed Weights   01/13/20 1326 01/14/20 0509 01/16/20 0400  Weight: 122 kg 122 kg 120.6 kg    Physical Exam:  General exam: awake, alert, no acute distress, obese Respiratory system: CTAB, no wheezes, rales or rhonchi, normal respiratory effort. Cardiovascular system: normal S1/S2, RRR, no pedal edema.   Gastrointestinal system: soft, NT, ND Central nervous system: A&O x4. no gross focal neurologic deficits, normal speech Extremities: moves all, no cyanosis, edema in B/L upper extremities Psychiatry: normal mood, congruent affect, judgement and insight appear normal  Labs   Data Reviewed: I have personally reviewed following labs and imaging studies  CBC: Recent Labs  Lab 01/10/20 1837 01/11/20 0859 01/12/20 1522 01/12/20 1947 01/13/20 0444 01/13/20 0917 01/13/20 1457 01/14/20 0524 01/15/20 0419 01/15/20 1828 01/16/20 0239  WBC 18.1*  --  13.6*  --  13.1*  --   --  12.1* 11.4*  --  10.3  NEUTROABS 15.7*  --   --   --   --   --   --   --   --   --   --   HGB 8.0*   < > 8.9*  8.6*   < > 8.5*   < > 9.0* 8.5* 7.9* 9.2* 7.6*  HCT 25.7*   < > 27.1*  27.4*   < > 26.0*   < > 28.8* 27.3* 25.2*  29.7* 24.2*  MCV 84.0  --  85.6  --  83.6  --   --  86.4 86.0  --  85.8  PLT 426*  --  352  --  336  --   --  352 344  --  320   < > = values in this interval not displayed.   Basic Metabolic Panel: Recent Labs  Lab 01/12/20 0527 01/13/20 0444 01/14/20 0524 01/15/20 0419 01/16/20 0239  NA 135 133* 136 137 137  K 4.6 4.7 4.3 4.2 3.9  CL 106 107 106 109 107  CO2 21* 17* 20* 21* 22  GLUCOSE 155* 128* 150* 119* 127*  BUN 31* 25* 22 18 14   CREATININE 1.71* 1.47* 1.55* 1.31* 1.21  CALCIUM 8.3* 8.0* 8.4* 7.7* 7.5*  MG  --  2.0  --   --  1.7   GFR: Estimated Creatinine Clearance: 63.6 mL/min (by C-G formula based on SCr of 1.21 mg/dL). Liver Function Tests: No results for input(s): AST, ALT, ALKPHOS, BILITOT, PROT, ALBUMIN in the last 168 hours. No results for input(s): LIPASE, AMYLASE in the last 168 hours. No results for input(s): AMMONIA in the last 168 hours. Coagulation Profile: Recent Labs  Lab 01/10/20 1837 01/11/20 0324  INR 1.9* 1.7*   Cardiac Enzymes: No results for input(s): CKTOTAL, CKMB, CKMBINDEX, TROPONINI in the last 168 hours. BNP (last 3 results) No results for input(s): PROBNP in the last 8760 hours. HbA1C: No results for input(s): HGBA1C in the last 72 hours. CBG: Recent Labs  Lab 01/15/20 1122 01/15/20 1758 01/15/20 2030 01/16/20 0008 01/16/20 0357  GLUCAP 218* 112* 122* 120* 118*   Lipid Profile: No results for input(s): CHOL, HDL, LDLCALC, TRIG, CHOLHDL, LDLDIRECT in the last 72 hours. Thyroid Function Tests: No results for input(s): TSH, T4TOTAL, FREET4, T3FREE, THYROIDAB in the last 72 hours. Anemia Panel: No results for input(s): VITAMINB12, FOLATE, FERRITIN, TIBC, IRON, RETICCTPCT in the last  72 hours. Sepsis Labs: Recent Labs  Lab 01/10/20 1837 01/10/20 2028 01/11/20 0324  PROCALCITON  --   --  0.11  LATICACIDVEN 2.0* 1.3  --     Recent Results (from the past 240 hour(s))  Culture, blood (routine x 2)     Status: None    Collection Time: 01/11/20 12:37 AM   Specimen: BLOOD  Result Value Ref Range Status   Specimen Description BLOOD RIGHT ANTECUBITAL  Final   Special Requests   Final    BOTTLES DRAWN AEROBIC AND ANAEROBIC Blood Culture results may not be optimal due to an excessive volume of blood received in culture bottles   Culture   Final    NO GROWTH 5 DAYS Performed at [facility], [address] [place], [place] [address]    Report Status 01/16/2020 FINAL  Final  Culture, blood (routine x 2)     Status: None   Collection Time: 01/11/20 12:37 AM   Specimen: BLOOD  Result Value Ref Range Status   Specimen Description BLOOD LEFT ANTECUBITAL  Final   Special Requests   Final    BOTTLES DRAWN AEROBIC AND ANAEROBIC Blood Culture results may not be optimal due to an excessive volume of blood received in culture bottles   Culture   Final    NO GROWTH 5 DAYS Performed at [facility], [address] [place], [place] [address]    Report Status 01/16/2020 FINAL  Final  SARS Coronavirus 2 by RT PCR (hospital order, performed in [facility] hospital lab) Nasopharyngeal Nasopharyngeal Swab     Status: None   Collection Time: 01/11/20  6:14 AM   Specimen: Nasopharyngeal Swab  Result Value Ref Range Status   SARS Coronavirus 2 NEGATIVE NEGATIVE Final    Comment: (NOTE) SARS-CoV-2 target nucleic acids are NOT DETECTED.  The SARS-CoV-2 RNA is generally detectable in upper and lower respiratory specimens during the acute phase of infection. The lowest concentration of SARS-CoV-2 viral copies this assay can detect is 250 copies / mL. A negative result does not preclude SARS-CoV-2 infection and should not be used as the sole basis for treatment or other patient management decisions.  A negative result may occur with improper specimen collection / handling, submission of specimen other than nasopharyngeal swab, presence of viral mutation(s) within the areas targeted by this assay, and  inadequate number of viral copies (<250 copies / mL). A negative result must be combined with clinical observations, patient history, and epidemiological information.  Fact Sheet for Patients:   BoilerBrush.com.cy  Fact Sheet for Healthcare Providers: https://pope.com/  This test is not yet approved or  cleared by the [place] FDA and has been authorized for detection and/or diagnosis of SARS-CoV-2 by FDA under an Emergency Use Authorization (EUA).  This EUA will remain in effect (meaning this test can be used) for the duration of the COVID-19 declaration under Section 564(b)(1) of the Act, 21 U.S.C. section 360bbb-3(b)(1), unless the authorization is terminated or revoked sooner.  Performed at [facility], [address] [place], [place] [address]   Culture, body fluid-bottle     Status: Abnormal   Collection Time: 01/11/20  6:07 PM   Specimen: Fluid  Result Value Ref Range Status   Specimen Description FLUID KNEE  Final   Special Requests   Final    BOTTLES DRAWN AEROBIC AND ANAEROBIC Blood Culture adequate volume   Gram Stain   Final    GRAM POSITIVE COCCI IN CLUSTERS ANAEROBIC BOTTLE  ONLY CRITICAL RESULT CALLED TO, READ BACK BY AND VERIFIED WITH: RN Sheria Lang [id number] FCP NO ORGANISMS SEEN AEROBIC BOTTLE ONLY Performed at [facility], [address], [place], [place] [address]    Culture ENTEROCOCCUS FAECALIS (A)  Final   Report Status 01/15/2020 FINAL  Final   Organism ID, Bacteria ENTEROCOCCUS FAECALIS  Final      Susceptibility   Enterococcus faecalis - MIC*    AMPICILLIN <=2 SENSITIVE Sensitive     VANCOMYCIN 1 SENSITIVE Sensitive     GENTAMICIN SYNERGY SENSITIVE Sensitive     * ENTEROCOCCUS FAECALIS  Gram stain     Status: None   Collection Time: 01/11/20  6:07 PM   Specimen: Fluid  Result Value Ref Range Status   Specimen Description FLUID KNEE  Final   Special Requests NONE   Final   Gram Stain   Final    FEW WBC PRESENT,BOTH PMN AND MONONUCLEAR NO ORGANISMS SEEN Performed at [facility], [address] [place], [place] [address]    Report Status 01/12/2020 FINAL  Final      Imaging Studies   No results found.   Medications   Scheduled Meds: . [MAR Hold] sodium chloride   Intravenous Once  . [MAR Hold] amiodarone  200 mg Oral Daily  . [MAR Hold] atorvastatin  40 mg Oral Daily  . [MAR Hold] diltiazem  60 mg Oral Q8H  . [MAR Hold] insulin aspart  0-15 Units Subcutaneous Q4H  . [MAR Hold] pantoprazole  40 mg Intravenous Q12H  . [MAR Hold] vitamin B-12  1,000 mcg Oral Daily   Continuous Infusions: . sodium chloride 20 mL/hr at 01/15/20 0815  . [MAR Hold] sodium chloride 500 mL (01/14/20 1643)  . sodium chloride         LOS: 6 days    Time spent: 25 minutes    Pennie Banter, DO [facility]  01/16/2020, 7:55 AM    If 7PM-7AM, please contact night-coverage. How to contact the [facility] Attending or Consulting provider 7A - 7P or covering provider during after hours 7P -7A, for this patient?    1. Check the care team in [facility] and look for a) attending/consulting [facility] provider listed and b) the [facility] team listed 2. Log into [email or web address] and use [facility]'s universal password to access. If you do not have the password, please contact the hospital operator. 3. Locate the [facility] provider you are looking for under [facility] and page to a number that you can be directly reached. 4. If you still have difficulty reaching the provider, please page the [facility] (Director on Call) for the Hospitalists listed on amion for assistance.

## 2020-01-16 NOTE — Anesthesia Preprocedure Evaluation (Signed)
Anesthesia Evaluation  Patient identified by MRN, date of birth, ID band Patient awake    Reviewed: Allergy & Precautions, H&P , NPO status , Patient's Chart, lab work & pertinent test results, reviewed documented beta blocker date and time   History of Anesthesia Complications Negative for: history of anesthetic complications  Airway Mallampati: I  TM Distance: >3 FB Neck ROM: full    Dental  (+) Edentulous Upper, Edentulous Lower   Pulmonary neg pulmonary ROS, former smoker,  Patient has new onset cough in past 2 days. Patient vaccinated for covid, negative test. Lungs CTAB.    Pulmonary exam normal breath sounds clear to auscultation       Cardiovascular Exercise Tolerance: Poor hypertension, On Medications + angina with exertion + CAD, + Cardiac Stents, + CABG and +CHF  Normal cardiovascular exam+ dysrhythmias Atrial Fibrillation  Rhythm:regular Rate:Normal - Systolic murmurs afib now in sinus s/p cardioversion  TTE 2021: 1. Left ventricular ejection fraction, by estimation, is 50 to 55%. The  left ventricle has low normal function. Left ventricular endocardial  border not optimally defined to evaluate regional wall motion. Left  ventricular diastolic function could not be  evaluated.  2. Right ventricular systolic function is normal. The right ventricular  size is normal. Tricuspid regurgitation signal is inadequate for assessing  PA pressure.  3. Left atrial size was mildly dilated.  4. The mitral valve is normal in structure. No evidence of mitral valve  regurgitation. No evidence of mitral stenosis.  5. The aortic valve is normal in structure. Aortic valve regurgitation is  not visualized. Mild aortic valve sclerosis is present, with no evidence  of aortic valve stenosis.  6. Mildly dilated pulmonary artery.  7. The inferior vena cava is normal in size with greater than 50%  respiratory variability, suggesting  right atrial pressure of 3 mmHg.    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, to go to OR tomorrow for antibiotic spacer   Abdominal (+) + obese,   Peds  Hematology  (+) Blood dyscrasia, anemia ,   Anesthesia Other Findings Past Medical History: No date: A-fib (HCC) No date: Diabetes (HCC) No date: Hyperlipidemia No date: Hypertension Past Surgical History: No date: BLADDER SURGERY No date: BYPASS GRAFT No date: CARDIOVERSION No date: COLON SURGERY No date: REPLACEMENT TOTAL KNEE BILATERAL No date: STENT PLACEMENT VASCULAR ([facility] HX) BMI    Body Mass Index: 38.73 kg/m     Reproductive/Obstetrics negative OB ROS                             Anesthesia Physical  Anesthesia Plan  ASA: III  Anesthesia Plan: General   Post-op Pain Management:    Induction: Intravenous  PONV Risk Score and Plan: 2 and Ondansetron, Propofol infusion and TIVA  Airway Management Planned: Natural Airway and Simple Face Mask  Additional Equipment: None  Intra-op Plan:   Post-operative Plan:   Informed Consent: I have reviewed the patients History and Physical, chart, labs and discussed the procedure including the risks, benefits and alternatives for the proposed anesthesia with the patient or authorized representative who has indicated his/her understanding and acceptance.     Dental Advisory Given  Plan Discussed with: CRNA  Anesthesia Plan Comments: (Procedure of   an urgent nature for evaluation of melena and anemia. Cannot delay case due to cough. Patient advised of increased risks. Discussed risks of anesthesia with patient, including possibility of difficulty with spontaneous ventilation under anesthesia  necessitating airway intervention, PONV, and rare risks such as cardiac or respiratory or neurological events. Patient understands. Patient informed about increased incidence of above perioperative risk due to high BMI. Patient understands. )        Anesthesia Quick Evaluation

## 2020-01-16 NOTE — [place] FL2 (Signed)
[place] MEDICAID FL2 LEVEL OF CARE SCREENING TOOL     IDENTIFICATION  Patient Name: Randall Vincent Birthdate: [birth year] Sex: male Admission Date (Current Location): 01/10/2020  [facility] and [place] Number:  Chiropodist and Address:  [facility], [address], [place], [place] [address]      Provider Number: [id number]  Attending Physician Name and Address:  Pennie Banter, DO  Relative Name and Phone Number:  Evertte Sones [phone]    Current Level of Care: SNF Recommended Level of Care: Skilled Nursing Facility Prior Approval Number:    Date Approved/Denied:   PASRR Number: [id number]  Discharge Plan: SNF    Current Diagnoses: Patient Active Problem List   Diagnosis Date Noted   Diverticulosis of colon without hemorrhage    Intestinal bypass or anastomosis status    Melena 01/10/2020   Symptomatic anemia 01/10/2020   Acute blood loss anemia 01/10/2020   Suspect Septic arthritis (HCC) 01/10/2020   Chronic anticoagulation 01/10/2020   History of bilateral knee replacement 01/10/2020   Chronic kidney disease, stage 3a 01/10/2020   Acute on chronic congestive heart failure (HCC) 01/10/2020   Elevated troponin 01/10/2020   Chest pain 06/26/2019   CAD, multiple vessel 06/26/2019   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/26/2019   Atrial fibrillation status post cardioversion 10/22/19 (HCC) 06/26/2019   Essential hypertension 06/26/2019   DM (diabetes mellitus), type 2 (HCC) 06/26/2019   Anginal equivalent (HCC) 06/26/2019    Orientation RESPIRATION BLADDER Height & Weight     Self, Time, Situation, Place  Normal Continent Weight: 120.6 kg Height:  5\' 8"  (172.7 cm)  BEHAVIORAL SYMPTOMS/MOOD NEUROLOGICAL BOWEL NUTRITION STATUS        Diet  AMBULATORY STATUS COMMUNICATION OF NEEDS Skin   Extensive Assist Verbally Normal                       Personal Care Assistance Level of Assistance  Bathing, Dressing Bathing  Assistance: Limited assistance Feeding assistance: Independent Dressing Assistance: Limited assistance     Functional Limitations Info             SPECIAL CARE FACTORS FREQUENCY  PT (By licensed PT), OT (By licensed OT)     PT Frequency: 5x a week OT Frequency: 5x a week            Contractures Contractures Info: Not present    Additional Factors Info  Code Status, Allergies Code Status Info: Full Allergies Info: No Known Drug Allergies           Current Medications (01/16/2020):  This is the current hospital active medication list Current Facility-Administered Medications  Medication Dose Route Frequency Provider Last Rate Last Admin   [MAR Hold] 0.9 %  sodium chloride infusion (Manually program via Guardrails IV Fluids)   Intravenous Once 01/18/2020, NP       0.9 %  sodium chloride infusion   Intravenous Continuous Jimmye Norman, MD 20 mL/hr at 01/15/20 0815 Continued from Pre-op at 01/15/20 0815   [facility] Hold] 0.9 %  sodium chloride infusion   Intravenous PRN [facility], MD 10 mL/hr at 01/14/20 1643 500 mL at 01/14/20 1643   acetaminophen (OFIRMEV) IV 1,000 mg  1,000 mg Intravenous Once PRN 01/16/20, MD       [MAR Hold] acetaminophen (TYLENOL) tablet 650 mg  650 mg Oral Q6H PRN Corinda Gubler, MD       Or   [facility] Hold]  acetaminophen (TYLENOL) suppository 650 mg  650 mg Rectal Q6H PRN Andris Baumann, MD       [MAR Hold] albuterol (PROVENTIL) (2.5 MG/3ML) 0.083% nebulizer solution 3 mL  3 mL Inhalation Q6H PRN Gertha Calkin, MD       [MAR Hold] amiodarone (PACERONE) tablet 200 mg  200 mg Oral Daily Irena Cords V, MD   200 mg at 01/14/20 0910   [MAR Hold] atorvastatin (LIPITOR) tablet 40 mg  40 mg Oral Daily Gertha Calkin, MD   40 mg at 01/14/20 0910   [MAR Hold] diltiazem (CARDIZEM) tablet 60 mg  60 mg Oral Q8H Irena Cords V, MD   60 mg at 01/15/20 2350   fentaNYL (SUBLIMAZE) injection 25-50 mcg  25-50 mcg Intravenous Q5 min PRN Corinda Gubler, MD       [facility] Hansen Hold] fluticasone (FLONASE) 50 MCG/ACT nasal spray 2 spray  2 spray Each Nare Daily PRN Gertha Calkin, MD       [facility] Hold] HYDROcodone-acetaminophen (NORCO/VICODIN) 5-325 MG per tablet 1-2 tablet  1-2 tablet Oral Q4H PRN Andris Baumann, MD   2 tablet at 01/15/20 2351   [MAR Hold] insulin aspart (novoLOG) injection 0-15 Units  0-15 Units Subcutaneous Q4H Andris Baumann, MD   2 Units at 01/15/20 2346   [MAR Hold] morphine 2 MG/ML injection 2 mg  2 mg Intravenous Q2H PRN Andris Baumann, MD   2 mg at 01/11/20 0905   [MAR Hold] ondansetron (ZOFRAN) tablet 4 mg  4 mg Oral Q6H PRN Andris Baumann, MD       Or   [facility] Hansen Hold] ondansetron [facility]) injection 4 mg  4 mg Intravenous Q6H PRN Andris Baumann, MD   4 mg at 01/11/20 0417   ondansetron (ZOFRAN) injection 4 mg  4 mg Intravenous Once PRN Corinda Gubler, MD       oxyCODONE (Oxy IR/ROXICODONE) immediate release tablet 5 mg  5 mg Oral Once PRN Corinda Gubler, MD       Or   oxyCODONE (ROXICODONE) 5 MG/5ML solution 5 mg  5 mg Oral Once PRN Corinda Gubler, MD       [facility] Hold] pantoprazole (PROTONIX) injection 40 mg  40 mg Intravenous Q12H Lindajo Royal V, MD   40 mg at 01/15/20 2345   piperacillin-tazobactam (ZOSYN) 3.375 GM/50ML IVPB            piperacillin-tazobactam (ZOSYN) IVPB 3.375 g  3.375 g Intravenous STAT Lyndle Herrlich, MD       [facility] Hold] traMADol Janean Sark) tablet 50 mg  50 mg Oral Q6H PRN Gertha Calkin, MD       vancomycin (VANCOCIN) IVPB 1000 mg/200 mL premix  1,000 mg Intravenous STAT Lyndle Herrlich, MD   1,000 mg at 01/16/20 1300   vancomycin (VANCOREADY) IVPB 500 mg/100 mL  500 mg Intravenous Once Lyndle Herrlich, MD       [facility] Hold] vitamin B-12 (CYANOCOBALAMIN) tablet 1,000 mcg  1,000 mcg Oral Daily Gertha Calkin, MD   1,000 mcg at 01/14/20 2233   Facility-Administered Medications Ordered in Other Encounters  Medication Dose Route Frequency Provider Last Rate Last Admin   fentaNYL (SUBLIMAZE) injection    Intravenous Anesthesia Intra-op Henrietta Hoover, CRNA   50 mcg at 01/16/20 1221   lidocaine (cardiac) 100 mg/67mL (XYLOCAINE) injection 2%   Intravenous Anesthesia Intra-op Henrietta Hoover, CRNA   80 mg at 01/16/20 1151   phenylephrine (NEO-SYNEPHRINE) 100 mcg/mL in sodium chloride  0.9 % 100 mL infusion   Intravenous Continuous PRN Stormy Fabian, CRNA 15 mL/hr at 01/16/20 1236 25 mcg/min at 01/16/20 1236   propofol (DIPRIVAN) 10 mg/mL bolus/IV push   Intravenous Anesthesia Intra-op Stormy Fabian, CRNA   40 mg at 01/16/20 1221   rocuronium (ZEMURON) injection   Intravenous Anesthesia Intra-op Henrietta Hoover, CRNA   80 mg at 01/16/20 1151     Discharge Medications: Please see discharge summary for a list of discharge medications.  Relevant Imaging Results:  Relevant Lab Results:   Additional Information [id number]  Shawn Route, RN

## 2020-01-16 NOTE — Progress Notes (Signed)
Steward Drone, NP informed about pt still not clear. NEW orders to give pt more prep placed by provider.

## 2020-01-16 NOTE — Op Note (Signed)
DATE OF SURGERY:  01/16/2020 TIME: 3:09 PM  PATIENT NAME:  Randall Vincent   AGE: 79 y.o.    PRE-OPERATIVE DIAGNOSIS:  Septic left knee  POST-OPERATIVE DIAGNOSIS:  Same  PROCEDURE:  Procedure(s): TOTAL KNEE REVISION, with explant of femoral, tibial and patellar components, placement of antibiotic articulating spacer  SURGEON:  Lyndle Herrlich, MD   ASSISTANT:  Altamese Cabal, PA-C  OPERATIVE IMPLANTS: [employer], Cruciate Retaining Cobalt Chrome Femoral component size  8,  with a 9 mm HighFlex insert.   PREOPERATIVE INDICATIONS:  Randall Vincent is an 79 y.o. male who has a diagnosis of Septic left knee and elected for a left total knee arthroplasty explantation after presenting with a knee aspiration positive for bacteria.   The risks, benefits, and alternatives were discussed at length including but not limited to the risks of infection, bleeding, nerve or blood vessel injury, knee stiffness, fracture, dislocation, loosening or failure of the hardware and the need for further surgery. Medical risks include but not limited to DVT and pulmonary embolism, myocardial infarction, stroke, pneumonia, respiratory failure and death. I discussed these risks with the patient. They understood these risks and were willing to proceed.  OPERATIVE FINDINGS AND UNIQUE ASPECTS OF THE CASE: Gross purulence throughout all three compartments of the knee.   OPERATIVE DESCRIPTION:  The patient was brought to the operative room and placed in a supine position after undergoing placement of a general anesthetic. IV antibiotics were given. The lower extremity was prepped and draped in the usual sterile fashion.  A time out was performed to verify the patient's name, date of birth, medical record number, correct site of surgery and correct procedure to be performed. The timeout was also used to confirm the patient received antibiotics and that appropriate instruments, implants and radiographs studies were  available in the room.  The leg was elevated and the tourniquet was inflated to 350 mmHg.  A midline incision was made over the left knee. The prior scar was excised and passed from the field. A medial parapatellar arthrotomy was then made and the patella subluxed laterally and the knee was brought into 90 of flexion. The medial and lateral gutters were debrided of extensive synovitis and capsular thickening.  The liner was removed, followed by the femoral component using osteotomes and a bone tamp. Minimal bone loss was noted. Using the sagittal saw the tibial component was undercut and finished with thin osteotomes. The tray was then removed and passed from the field. Using osteotomes the old cement was removed. The saw was used to remove the patellar component and a drill to remove the excess cement and polyethylene lugs.  The wound was irrigated with >4 liters of bacitracin laced normal saline.  Two batches of bone cement were then prepared with 4 g of vancomycin powder and 2.4 g of trobramycin. Cement was placed over the distal femur and proximal tibia. The femoral component was then placed followed by the liner. There was excellent stability   The medial arthrotomy was closed with #1 Vicryl and #2 Quill. The subcutaneous tissue closed with  2-0 vicryl, and skin approximated with staples.  A dry sterile and compressive dressing was applied.  A Polar Care was applied to the operative knee followed by a knee immobilizer.  The patient was awakened and brought to the PACU in stable and satisfactory condition.  All sharp, lap and instrument counts were correct at the conclusion the case. I spoke with the patient's family in the postop  consultation room to let them know the case had been performed without complication and the patient was stable in recovery room.   Total tourniquet time was 90 minutes.

## 2020-01-16 NOTE — H&P (Signed)
The patient has been re-examined, and the chart reviewed, and there have been no interval changes to the documented history and physical.  Plan a left total knee explant with spacer placement today.  Anesthesia is not consulted regarding a peripheral nerve block for post-operative pain.  The risks, benefits, and alternatives have been discussed at length, and the patient is willing to proceed.

## 2020-01-16 NOTE — Anesthesia Procedure Notes (Signed)
Date/Time: 01/16/2020 8:17 AM Performed by: Stormy Fabian, CRNA Pre-anesthesia Checklist: Patient identified, Emergency Drugs available, Suction available and Patient being monitored Patient Re-evaluated:Patient Re-evaluated prior to induction Oxygen Delivery Method: Nasal cannula Induction Type: IV induction Dental Injury: Teeth and Oropharynx as per pre-operative assessment  Comments: Nasal cannula with etCO2 monitoring

## 2020-01-16 NOTE — Transfer of Care (Signed)
Immediate Anesthesia Transfer of Care Note  Patient: Mordechai Matuszak  Procedure(s) Performed: Procedure(s): COLONOSCOPY (N/A)  Patient Location: PACU and Endoscopy Unit  Anesthesia Type:General  Level of Consciousness: sedated  Airway & Oxygen Therapy: Patient Spontanous Breathing and Patient connected to nasal cannula oxygen  Post-op Assessment: Report given to RN and Post -op Vital signs reviewed and stable  Post vital signs: Reviewed and stable  Last Vitals:  Vitals:   01/16/20 0741 01/16/20 0854  BP: (!) 162/62 (!) 104/43  Pulse: 81 68  Resp: 20 (!) 21  Temp: (!) 36.3 C   SpO2: 100% 95%    Complications: No apparent anesthesia complications

## 2020-01-17 ENCOUNTER — Other Ambulatory Visit: Payer: Medicare Other

## 2020-01-17 ENCOUNTER — Encounter: Payer: Self-pay | Admitting: Orthopedic Surgery

## 2020-01-17 DIAGNOSIS — I10 Essential (primary) hypertension: Secondary | ICD-10-CM

## 2020-01-17 LAB — BASIC METABOLIC PANEL
Anion gap: 7 (ref 5–15)
BUN: 14 mg/dL (ref 8–23)
CO2: 24 mmol/L (ref 22–32)
Calcium: 7.8 mg/dL — ABNORMAL LOW (ref 8.9–10.3)
Chloride: 105 mmol/L (ref 98–111)
Creatinine, Ser: 1.16 mg/dL (ref 0.61–1.24)
GFR calc Af Amer: >60 mL/min (ref >60)
GFR calc non Af Amer: 60 mL/min — ABNORMAL LOW (ref >60)
Glucose, Bld: 242 mg/dL — ABNORMAL HIGH (ref 70–99)
Potassium: 4.2 mmol/L (ref 3.5–5.1)
Sodium: 136 mmol/L (ref 135–145)

## 2020-01-17 LAB — TYPE AND SCREEN
ABO/RH(D): B POS
Antibody Screen: NEGATIVE
Unit division: 0

## 2020-01-17 LAB — CBC
HCT: 24.9 % — ABNORMAL LOW (ref 39.0–52.0)
Hemoglobin: 8.3 g/dL — ABNORMAL LOW (ref 13.0–17.0)
MCH: 27.9 pg (ref 26.0–34.0)
MCHC: 33.3 g/dL (ref 30.0–36.0)
MCV: 83.6 fL (ref 80.0–100.0)
Platelets: 314 10*3/uL (ref 150–400)
RBC: 2.98 MIL/uL — ABNORMAL LOW (ref 4.22–5.81)
RDW: 16.6 % — ABNORMAL HIGH (ref 11.5–15.5)
WBC: 13.4 10*3/uL — ABNORMAL HIGH (ref 4.0–10.5)
nRBC: 0 % (ref 0.0–0.2)

## 2020-01-17 LAB — BPAM RBC
Blood Product Expiration Date: 202109232359
ISSUE DATE / TIME: 202108261249
Unit Type and Rh: 7300

## 2020-01-17 LAB — GLUCOSE, CAPILLARY
Glucose-Capillary: 153 mg/dL — ABNORMAL HIGH (ref 70–99)
Glucose-Capillary: 171 mg/dL — ABNORMAL HIGH (ref 70–99)
Glucose-Capillary: 172 mg/dL — ABNORMAL HIGH (ref 70–99)
Glucose-Capillary: 180 mg/dL — ABNORMAL HIGH (ref 70–99)
Glucose-Capillary: 183 mg/dL — ABNORMAL HIGH (ref 70–99)
Glucose-Capillary: 227 mg/dL — ABNORMAL HIGH (ref 70–99)
Glucose-Capillary: 238 mg/dL — ABNORMAL HIGH (ref 70–99)

## 2020-01-17 LAB — HEMOGLOBIN AND HEMATOCRIT, BLOOD
HCT: 25.4 % — ABNORMAL LOW (ref 39.0–52.0)
HCT: 24.6 % — ABNORMAL LOW (ref 39.0–52.0)
Hemoglobin: 8.1 g/dL — ABNORMAL LOW (ref 13.0–17.0)
Hemoglobin: 7.7 g/dL — ABNORMAL LOW (ref 13.0–17.0)

## 2020-01-17 LAB — MAGNESIUM: Magnesium: 2 mg/dL (ref 1.7–2.4)

## 2020-01-17 MED ORDER — POLYETHYLENE GLYCOL 3350 17 G PO PACK
17.0000 g | PACK | Freq: Every day | ORAL | Status: DC
  Administered 2020-01-17 – 2020-01-19 (×3): 17 g via ORAL
  Filled 2020-01-17 (×6): qty 1

## 2020-01-17 MED ORDER — CHLORHEXIDINE GLUCONATE CLOTH 2 % EX PADS
6.0000 | MEDICATED_PAD | Freq: Every day | CUTANEOUS | Status: DC
  Administered 2020-01-17 – 2020-01-22 (×3): 6 via TOPICAL

## 2020-01-17 MED ORDER — BISACODYL 5 MG PO TBEC: 5.0000 mg | DELAYED_RELEASE_TABLET | Freq: Every day | ORAL | Status: DC | PRN

## 2020-01-17 MED ORDER — FUROSEMIDE 40 MG PO TABS
40.0000 mg | ORAL_TABLET | Freq: Every day | ORAL | Status: DC
  Administered 2020-01-17 – 2020-01-19 (×3): 40 mg via ORAL
  Filled 2020-01-17 (×3): qty 1

## 2020-01-17 NOTE — Progress Notes (Signed)
[facility] CLINIC INFECTIOUS DISEASE PROGRESS NOTE Date of Admission:  01/10/2020     ID: Randall Vincent is a 79 y.o. male with  PJI Principal Problem:   Melena Active Problems:   CAD, multiple vessel   S/P CABG x 2   Atrial fibrillation status post cardioversion 10/22/19 [facility])   Essential hypertension   DM (diabetes mellitus), type 2 (HCC)   Symptomatic anemia   Acute blood loss anemia   Suspect Septic arthritis (HCC)   Chronic anticoagulation   History of bilateral knee replacement   Chronic kidney disease, stage 3a   Acute on chronic congestive heart failure (HCC)   Elevated troponin   Intestinal bypass or anastomosis status   Diverticulosis of colon without hemorrhage   Subjective: Feels ok post op. NO fevers   ROS  Eleven systems are reviewed and negative except per hpi  Medications:  Antibiotics Given (last 72 hours)    Date/Time Action Medication Dose Rate   01/14/20 1644 New Bag/Given   piperacillin-tazobactam (ZOSYN) IVPB 3.375 g 3.375 g 12.5 mL/hr   01/15/20 0034 New Bag/Given   piperacillin-tazobactam (ZOSYN) IVPB 3.375 g 3.375 g 12.5 mL/hr   01/16/20 1300 Given   vancomycin (VANCOCIN) IVPB 1000 mg/200 mL premix 1,000 mg    01/16/20 1300 Given   vancomycin (VANCOREADY) IVPB 500 mg/100 mL 500 mg    01/16/20 1412 Given  [50,000u BACI MIXED IN OF NORMAL SALINE 50,000U BACI MIXED IN OF NORMAL SALINE]   50,000 units bacitracin in 0.9% normal saline 250 mL irrigation 3,000 mL    01/16/20 1845 New Bag/Given   cefTRIAXone (ROCEPHIN) 2 g in sodium chloride 0.9 % 100 mL IVPB 2 g 200 mL/hr   01/17/20 1233 New Bag/Given   vancomycin (VANCOREADY) IVPB 1500 mg/300 mL 1,500 mg 150 mL/hr     . sodium chloride   Intravenous Once  . amiodarone  200 mg Oral Daily  . atorvastatin  40 mg Oral Daily  . Chlorhexidine Gluconate Cloth  6 each Topical Daily  . diltiazem  60 mg Oral Q8H  . furosemide  40 mg Oral Daily  . insulin aspart  0-15 Units Subcutaneous Q4H  .  polyethylene glycol  17 g Oral Daily  . vitamin B-12  1,000 mcg Oral Daily    Objective: Vital signs in last 24 hours: Temp:  [97.7 F (36.5 C)-98.3 F (36.8 C)] 98.3 F (36.8 C) (08/27 0746) Pulse Rate:  [73-87] 87 (08/27 1200) Resp:  [18-24] 19 (08/27 1200) BP: (96-151)/(51-70) 151/51 (08/27 1200) SpO2:  [92 %-98 %] 95 % (08/27 1200) Weight:  [127.9 kg] 127.9 kg (08/27 0358) Physical Exam  Constitutional: He is oriented to person, place, and time.obese HEENT anicteric Mouth/Throat: Oropharynx is clear and moist. No oropharyngeal exudate.  Cardiovascular: Normal rate, regular rhythm and normal heart sounds.  Pulmonary/Chest: Effort normal and breath sounds normal. No respiratory distress. He has no wheezes.  Abdominal: Soft. Bowel sounds are normal. He exhibits no distension. There is no tenderness.  Lymphadenopathy: He has no cervical adenopathy.  Neurological: He is alert and oriented to person, place, and time Ext L knee in polar ice immobilizer .  Skin: Skin is warm and dry. No rash noted. No erythema.  Psychiatric: He has a normal mood and affect. His behavior is normal.      Lab Results Recent Labs    01/16/20 0239 01/16/20 0956 01/17/20 0108 01/17/20 1010  WBC 10.3  --  13.4*  --   HGB 7.6*   < >  8.3* 8.1*  HCT 24.2*   < > 24.9* 25.4*  NA 137  --  136  --   K 3.9  --  4.2  --   CL 107  --  105  --   CO2 22  --  24  --   BUN 14  --  14  --   CREATININE 1.21  --  1.16  --    < > = values in this interval not displayed.    Microbiology: Results for orders placed or performed during the hospital encounter of 01/10/20  Culture, blood (routine x 2)     Status: None   Collection Time: 01/11/20 12:37 AM   Specimen: BLOOD  Result Value Ref Range Status   Specimen Description BLOOD RIGHT ANTECUBITAL  Final   Special Requests   Final    BOTTLES DRAWN AEROBIC AND ANAEROBIC Blood Culture results may not be optimal due to an excessive volume of blood received in  culture bottles   Culture   Final    NO GROWTH 5 DAYS Performed at [facility], [address] [place], [place] [address]    Report Status 01/16/2020 FINAL  Final  Culture, blood (routine x 2)     Status: None   Collection Time: 01/11/20 12:37 AM   Specimen: BLOOD  Result Value Ref Range Status   Specimen Description BLOOD LEFT ANTECUBITAL  Final   Special Requests   Final    BOTTLES DRAWN AEROBIC AND ANAEROBIC Blood Culture results may not be optimal due to an excessive volume of blood received in culture bottles   Culture   Final    NO GROWTH 5 DAYS Performed at [facility], [address] [place], [place] [address]    Report Status 01/16/2020 FINAL  Final  SARS Coronavirus 2 by RT PCR (hospital order, performed in [facility] hospital lab) Nasopharyngeal Nasopharyngeal Swab     Status: None   Collection Time: 01/11/20  6:14 AM   Specimen: Nasopharyngeal Swab  Result Value Ref Range Status   SARS Coronavirus 2 NEGATIVE NEGATIVE Final    Comment: (NOTE) SARS-CoV-2 target nucleic acids are NOT DETECTED.  The SARS-CoV-2 RNA is generally detectable in upper and lower respiratory specimens during the acute phase of infection. The lowest concentration of SARS-CoV-2 viral copies this assay can detect is 250 copies / mL. A negative result does not preclude SARS-CoV-2 infection and should not be used as the sole basis for treatment or other patient management decisions.  A negative result may occur with improper specimen collection / handling, submission of specimen other than nasopharyngeal swab, presence of viral mutation(s) within the areas targeted by this assay, and inadequate number of viral copies (<250 copies / mL). A negative result must be combined with clinical observations, patient history, and epidemiological information.  Fact Sheet for Patients:   BoilerBrush.com.cy  Fact Sheet for Healthcare  Providers: https://pope.com/  This test is not yet approved or  cleared by the [place] FDA and has been authorized for detection and/or diagnosis of SARS-CoV-2 by FDA under an Emergency Use Authorization (EUA).  This EUA will remain in effect (meaning this test can be used) for the duration of the COVID-19 declaration under Section 564(b)(1) of the Act, 21 U.S.C. section 360bbb-3(b)(1), unless the authorization is terminated or revoked sooner.  Performed at [facility], [address], [place], [place] [address]   Culture, body fluid-bottle     Status: Abnormal   Collection Time:  01/11/20  6:07 PM   Specimen: Fluid  Result Value Ref Range Status   Specimen Description FLUID KNEE  Final   Special Requests   Final    BOTTLES DRAWN AEROBIC AND ANAEROBIC Blood Culture adequate volume   Gram Stain   Final    GRAM POSITIVE COCCI IN CLUSTERS ANAEROBIC BOTTLE ONLY CRITICAL RESULT CALLED TO, READ BACK BY AND VERIFIED WITH: RN Sheria Lang [id number] FCP NO ORGANISMS SEEN AEROBIC BOTTLE ONLY Performed at [facility], [address] [place], [place] [address]    Culture ENTEROCOCCUS FAECALIS (A)  Final   Report Status 01/15/2020 FINAL  Final   Organism ID, Bacteria ENTEROCOCCUS FAECALIS  Final      Susceptibility   Enterococcus faecalis - MIC*    AMPICILLIN <=2 SENSITIVE Sensitive     VANCOMYCIN 1 SENSITIVE Sensitive     GENTAMICIN SYNERGY SENSITIVE Sensitive     * ENTEROCOCCUS FAECALIS  Gram stain     Status: None   Collection Time: 01/11/20  6:07 PM   Specimen: Fluid  Result Value Ref Range Status   Specimen Description FLUID KNEE  Final   Special Requests NONE  Final   Gram Stain   Final    FEW WBC PRESENT,BOTH PMN AND MONONUCLEAR NO ORGANISMS SEEN Performed at [facility], [address] [place], [place] [address]    Report Status 01/12/2020 FINAL  Final  Aerobic/Anaerobic Culture (surgical/deep wound)     Status:  None (Preliminary result)   Collection Time: 01/16/20 12:41 PM   Specimen: PATH Cytology Misc. fluid; Body Fluid  Result Value Ref Range Status   Specimen Description TISSUE  Final   Special Requests LEFT KNEE TISSUE  Final   Gram Stain   Final    FEW WBC PRESENT,BOTH PMN AND MONONUCLEAR NO ORGANISMS SEEN    Culture   Final    CULTURE REINCUBATED FOR BETTER GROWTH Performed at [facility], [address], [place], [place] [address]    Report Status PENDING  Incomplete    Studies/Results: No results found.  Assessment/Plan: Impression/Recommendation Lt knee prosthetic joint infection- aspiration of the synovial fluid on 8/21 positive for enterococcus -no cell count available Underwent explantation - await tissue culture- until then continue vanco and ceftriaxone. Blood culture neg Depending on culture result the antibiotics can be deescalated to ampicillin but this is difficult to give as otpt or at SNF due to frequent dosing and instability once mixed.  Also watch closely for cr. If worsening then switch vanco to amp  Anemia - secondary to GI bleed ( likely chronic) had taken NSAIDs when he had th knee pain. Also on pradaxa  Afib- on amiodarone, pradaxa  AkI- watch closely while on  vanco. B/l venous edema of legs  Rt TKA with h/o staph infection in 2008 - treated without removing hardware and was on oral antibiotics ( after IV)  for many years  May need IV zosyn  Thank you very much for the consult. Will follow with you.  Mick Sell   01/17/2020, 4:22 PM

## 2020-01-17 NOTE — Progress Notes (Signed)
Foley has been discontinued and removed 

## 2020-01-17 NOTE — Anesthesia Postprocedure Evaluation (Signed)
Anesthesia Post Note  Patient: Randall Vincent  Procedure(s) Performed: COLONOSCOPY (N/A )  Patient location during evaluation: Endoscopy Anesthesia Type: General Level of consciousness: awake and alert and oriented Pain management: pain level controlled Vital Signs Assessment: post-procedure vital signs reviewed and stable Respiratory status: spontaneous breathing Cardiovascular status: blood pressure returned to baseline Anesthetic complications: no   No complications documented.   Last Vitals:  Vitals:   01/17/20 0800 01/17/20 1200  BP: (!) 136/51 (!) 151/51  Pulse: 75 87  Resp: 20 19  Temp:    SpO2: 94% 95%    Last Pain:  Vitals:   01/17/20 1118  TempSrc:   PainSc: 4                  Mirl Hillery

## 2020-01-17 NOTE — Progress Notes (Signed)
[name], MD [address], [place], [place], [address] [address], [place], [place], [address] Phone: [phone]  Fax: [phone]   Subjective: Patient denies any further melena.  No abdominal pain.  Hemoglobin improving.  Patient is status post knee surgery yesterday   Objective: Exam: Vital signs in last 24 hours: Vitals:   01/17/20 0600 01/17/20 0746 01/17/20 0800 01/17/20 1200  BP: (!) 143/54  (!) 136/51 (!) 151/51  Pulse: 73  75 87  Resp: (!) 22  20 19   Temp:  98.3 F (36.8 C)    TempSrc:  Oral    SpO2: 92%  94% 95%  Weight:      Height:       Weight change: 7.303 kg  Intake/Output Summary (Last 24 hours) at 01/17/2020 1235 Last data filed at 01/17/2020 1100 Gross per 24 hour  Intake 2458.58 ml  Output 1850 ml  Net 608.58 ml    General: No acute distress, AAO x3 Abd: Soft, NT/ND, No HSM Skin: Warm, no rashes Neck: Supple, Trachea midline   Lab Results: Lab Results  Component Value Date   WBC 13.4 (H) 01/17/2020   HGB 8.1 (L) 01/17/2020   HCT 25.4 (L) 01/17/2020   MCV 83.6 01/17/2020   PLT 314 01/17/2020   Micro Results: Recent Results (from the past 240 hour(s))  Culture, blood (routine x 2)     Status: None   Collection Time: 01/11/20 12:37 AM   Specimen: BLOOD  Result Value Ref Range Status   Specimen Description BLOOD RIGHT ANTECUBITAL  Final   Special Requests   Final    BOTTLES DRAWN AEROBIC AND ANAEROBIC Blood Culture results may not be optimal due to an excessive volume of blood received in culture bottles   Culture   Final    NO GROWTH 5 DAYS Performed at [facility], [address] [place], [place]    Report Status 01/16/2020 FINAL  Final  Culture, blood (routine x 2)     Status: None   Collection Time: 01/11/20 12:37 AM   Specimen: BLOOD  Result Value Ref Range Status   Specimen Description BLOOD LEFT ANTECUBITAL  Final   Special Requests   Final    BOTTLES DRAWN AEROBIC  AND ANAEROBIC Blood Culture results may not be optimal due to an excessive volume of blood received in culture bottles   Culture   Final    NO GROWTH 5 DAYS Performed at [facility], [address], [place], [place]    Report Status 01/16/2020 FINAL  Final  SARS Coronavirus 2 by RT PCR (hospital order, performed in [facility] hospital lab) Nasopharyngeal Nasopharyngeal Swab     Status: None   Collection Time: 01/11/20  6:14 AM   Specimen: Nasopharyngeal Swab  Result Value Ref Range Status   SARS Coronavirus 2 NEGATIVE NEGATIVE Final    Comment: (NOTE) SARS-CoV-2 target nucleic acids are NOT DETECTED.  The SARS-CoV-2 RNA is generally detectable in upper and lower respiratory specimens during the acute phase of infection. The lowest concentration of SARS-CoV-2 viral copies this assay can detect is 250 copies / mL. A negative result does not preclude SARS-CoV-2 infection and should not be used as the sole basis for treatment or other patient management decisions.  A negative result may occur with improper specimen collection / handling, submission of specimen other than nasopharyngeal swab, presence of viral mutation(s) within the areas targeted by this assay, and inadequate number of  viral copies (<250 copies / mL). A negative result must be combined with clinical observations, patient history, and epidemiological information.  Fact Sheet for Patients:   BoilerBrush.com.cy  Fact Sheet for Healthcare Providers: https://pope.com/  This test is not yet approved or  cleared by the [place] FDA and has been authorized for detection and/or diagnosis of SARS-CoV-2 by FDA under an Emergency Use Authorization (EUA).  This EUA will remain in effect (meaning this test can be used) for the duration of the COVID-19 declaration under Section 564(b)(1) of the Act, 21 U.S.C. section 360bbb-3(b)(1), unless the authorization  is terminated or revoked sooner.  Performed at [facility], [address] [place], [place] [address]   Culture, body fluid-bottle     Status: Abnormal   Collection Time: 01/11/20  6:07 PM   Specimen: Fluid  Result Value Ref Range Status   Specimen Description FLUID KNEE  Final   Special Requests   Final    BOTTLES DRAWN AEROBIC AND ANAEROBIC Blood Culture adequate volume   Gram Stain   Final    GRAM POSITIVE COCCI IN CLUSTERS ANAEROBIC BOTTLE ONLY CRITICAL RESULT CALLED TO, READ BACK BY AND VERIFIED WITH: RN Sheria Lang [id number] FCP NO ORGANISMS SEEN AEROBIC BOTTLE ONLY Performed at [facility], [address] [place], [place] [address]    Culture ENTEROCOCCUS FAECALIS (A)  Final   Report Status 01/15/2020 FINAL  Final   Organism ID, Bacteria ENTEROCOCCUS FAECALIS  Final      Susceptibility   Enterococcus faecalis - MIC*    AMPICILLIN <=2 SENSITIVE Sensitive     VANCOMYCIN 1 SENSITIVE Sensitive     GENTAMICIN SYNERGY SENSITIVE Sensitive     * ENTEROCOCCUS FAECALIS  Gram stain     Status: None   Collection Time: 01/11/20  6:07 PM   Specimen: Fluid  Result Value Ref Range Status   Specimen Description FLUID KNEE  Final   Special Requests NONE  Final   Gram Stain   Final    FEW WBC PRESENT,BOTH PMN AND MONONUCLEAR NO ORGANISMS SEEN Performed at [facility], [address] [place], [place] [address]    Report Status 01/12/2020 FINAL  Final  Aerobic/Anaerobic Culture (surgical/deep wound)     Status: None (Preliminary result)   Collection Time: 01/16/20 12:41 PM   Specimen: PATH Cytology Misc. fluid; Body Fluid  Result Value Ref Range Status   Specimen Description TISSUE  Final   Special Requests LEFT KNEE TISSUE  Final   Gram Stain PENDING  Incomplete   Culture   Final    CULTURE REINCUBATED FOR BETTER GROWTH Performed at [facility], [address], [place], [place] [address]    Report Status PENDING  Incomplete    Studies/Results: No results found. Medications:  Scheduled Meds: . sodium chloride   Intravenous Once  . amiodarone  200 mg Oral Daily  . atorvastatin  40 mg Oral Daily  . Chlorhexidine Gluconate Cloth  6 each Topical Daily  . diltiazem  60 mg Oral Q8H  . insulin aspart  0-15 Units Subcutaneous Q4H  . polyethylene glycol  17 g Oral Daily  . vitamin B-12  1,000 mcg Oral Daily   Continuous Infusions: . sodium chloride 75 mL/hr at 01/17/20 0539  . sodium chloride 500 mL (01/14/20 1643)  . cefTRIAXone (ROCEPHIN)  IV 2 g (01/16/20 1845)  . vancomycin 1,500 mg (01/17/20 1233)   PRN Meds:.sodium chloride, acetaminophen **OR** acetaminophen, albuterol, bisacodyl, fluticasone, HYDROcodone-acetaminophen,  morphine injection, ondansetron **OR** ondansetron (ZOFRAN) IV, traMADol   Assessment: Principal Problem:   Melena Active Problems:   CAD, multiple vessel   S/P CABG x 2   Atrial fibrillation status post cardioversion 10/22/19 [facility])   Essential hypertension   DM (diabetes mellitus), type 2 (HCC)   Symptomatic anemia   Acute blood loss anemia   Suspect Septic arthritis (HCC)   Chronic anticoagulation   History of bilateral knee replacement   Chronic kidney disease, stage 3a   Acute on chronic congestive heart failure (HCC)   Elevated troponin   Intestinal bypass or anastomosis status   Diverticulosis of colon without hemorrhage    Plan: No further active GI bleeding Patient should follow-up in GI clinic in 3 to 4 weeks after discharge If anemia continues, or patient has any active signs of GI bleeding, he may need small bowel capsule study as an inpatient  If anemia continues to improve and no further active GI bleeding occurs, patient can follow-up as an outpatient in GI clinic and need for elective capsule study can be discussed at that time based on clinical status  GI service will sign off at this time, please page with any questions or concerns or any signs of active GI  bleeding   LOS: 7 days   [name], MD 01/17/2020, 12:35 PM

## 2020-01-17 NOTE — Evaluation (Signed)
Physical Therapy Re-Evaluation Patient Details Name: Randall Vincent MRN: [id number] DOB: 10/11/1940 Today's Date: 01/17/2020   History of Present Illness  79 y.o. male here with left knee pain and swelling, ultimately septic and needing TKA hardwear removeal and AB spacer placed 8/26.  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  He has not had problems with the left knee until recently.  Clinical Impression  PT re-eval after L TKA hardwear removal with anti-biotic spacer yesterday. He needed assist with L LE in KI in getting to EOB, as well as some assist with getting to standing but he showed great effort t/o the session and did surprisingly well with maintaining TDWBing and with the minimal side and forward stepping that we did near EOB.  Pt's vitals remained relatively stable during standing/"ambulation" effort. Pt acknowledges that he needs to go to rehab and that he will be functionally limited with WBing status but is very eager to do as much as he can to get back to baseline.       Follow Up Recommendations SNF;Supervision for mobility/OOB    Equipment Recommendations  None recommended by PT    Recommendations for Other Services       Precautions / Restrictions Precautions Precautions: Fall Restrictions Weight Bearing Restrictions: Yes LLE Weight Bearing: Touchdown weight bearing      Mobility  Bed Mobility Overal bed mobility: Needs Assistance Bed Mobility: Supine to Sit     Supine to sit: Mod assist     General bed mobility comments: Pt was actually able to do majority of UE work (HOB up) using rail and needing some extra time, he did need assist with LEs (mostly lifting/guiding L in KI) to EOB  Transfers Overall transfer level: Needs assistance Equipment used: Rolling walker (2 wheeled) Transfers: Sit to/from Stand Sit to Stand: Min assist;Mod  assist;+2 physical assistance         General transfer comment: Did not elevate surface to simulate realistic transfer, pt slow to shift hips forward and lift shoulders (direct PT assist for both) but never did assume fully upright.  Pt appeared to do well maintaining TDWBing only on the L  Ambulation/Gait Ambulation/Gait assistance: Min assist;+2 physical assistance Gait Distance (Feet): 3 Feet Assistive device: Rolling walker (2 wheeled)       General Gait Details: Pt in L KI, able to maintain TDWBing seemingly quite well.  He did have some expected fatigue with the effort and was heavily reliant on the walker but was actually able to take many purposeful small shuffling steps again while maintaining TDWBing surprisingly well.  Stairs            Wheelchair Mobility    Modified Rankin (Stroke Patients Only)       Balance Overall balance assessment: Needs assistance Sitting-balance support: Feet supported Sitting balance-Leahy Scale: Good Sitting balance - Comments: Steady static sitting once L LE assisted to floor     Standing balance-Leahy Scale: Fair Standing balance comment: highly reliant on the walker, unable to attain fully upright but given WBing status was relatively safe and stable                             Pertinent Vitals/Pain Pain Assessment: 0-10 Pain Score: 4     Home Living Family/patient expects to be discharged to:: Skilled nursing facility  Living Arrangements: Alone Available Help at Discharge: Family;Available PRN/intermittently Type of Home:  (camper in son's yard) Home Access: Stairs to enter Entrance Stairs-Rails: Can reach both Entrance Stairs-Number of Steps: 5 Home Layout: One level Home Equipment: Walker - 2 wheels;Cane - single point;Adaptive equipment      Prior Function Level of Independence: Independent with assistive device(s)         Comments: Pt reports he was independent with ADL mgt, using a SPC or 2WW  for functional mobility. +driving, running errands 1 week prior to admission.     Hand Dominance        Extremity/Trunk Assessment   Upper Extremity Assessment Upper Extremity Assessment: Overall WFL for tasks assessed;Generalized weakness    Lower Extremity Assessment Lower Extremity Assessment:  (L LE in KI, needs assist with all movement)       Communication   Communication: No difficulties  Cognition Arousal/Alertness: Awake/alert Behavior During Therapy: WFL for tasks assessed/performed Overall Cognitive Status: Within Functional Limits for tasks assessed                                        General Comments      Exercises     Assessment/Plan    PT Assessment Patient needs continued PT services  PT Problem List Decreased strength;Decreased mobility;Decreased range of motion;Decreased activity tolerance;Decreased balance;Pain       PT Treatment Interventions DME instruction;Therapeutic activities;Gait training;Therapeutic exercise;Patient/family education;Stair training;Balance training;Functional mobility training;Neuromuscular re-education    PT Goals (Current goals can be found in the Care Plan section)  Acute Rehab PT Goals Patient Stated Goal: get well enough to go back home PT Goal Formulation: With patient Time For Goal Achievement: 01/31/20 Potential to Achieve Goals: Fair    Frequency 7X/week   Barriers to discharge        Co-evaluation               AM-PAC PT "6 Clicks" Mobility  Outcome Measure Help needed turning from your back to your side while in a flat bed without using bedrails?: A Lot Help needed moving from lying on your back to sitting on the side of a flat bed without using bedrails?: A Lot Help needed moving to and from a bed to a chair (including a wheelchair)?: A Lot Help needed standing up from a chair using your arms (e.g., wheelchair or bedside chair)?: A Lot Help needed to walk in hospital room?: A  Lot Help needed climbing 3-5 steps with a railing? : Total 6 Click Score: 11    End of Session Equipment Utilized During Treatment: Gait belt Activity Tolerance: Patient limited by pain;Patient tolerated treatment well;Patient limited by fatigue Patient left: in bed;with call bell/phone within reach;with bed alarm set;with family/visitor present Nurse Communication: Mobility status PT Visit Diagnosis: Muscle weakness (generalized) (M62.81);Other abnormalities of gait and mobility (R26.89);Difficulty in walking, not elsewhere classified (R26.2);Pain Pain - Right/Left: Left Pain - part of body: Knee    Time: 1610-9604 PT Time Calculation (min) (ACUTE ONLY): 40 min   Charges:   PT Evaluation $PT Re-evaluation: 1 Re-eval PT Treatments $Therapeutic Activity: 23-37 mins        Malachi Pro, DPT 01/17/2020, 3:07 PM

## 2020-01-17 NOTE — Progress Notes (Signed)
Subjective:  Patient reports pain as moderate.    Objective:   VITALS:   Vitals:   01/17/20 0600 01/17/20 0746 01/17/20 0800 01/17/20 1200  BP: (!) 143/54  (!) 136/51 (!) 151/51  Pulse: 73  75 87  Resp: (!) 22  20 19   Temp:  98.3 F (36.8 C)    TempSrc:  Oral    SpO2: 92%  94% 95%  Weight:      Height:        PHYSICAL EXAM:  Neurologically intact ABD soft Neurovascular intact Sensation intact distally Intact pulses distally Dorsiflexion/Plantar flexion intact Incision: dressing C/D/I No cellulitis present Compartment soft immobilizer in place  LABS  Results for orders placed or performed during the hospital encounter of 01/10/20 (from the past 24 hour(s))  Aerobic/Anaerobic Culture (surgical/deep wound)     Status: None (Preliminary result)   Collection Time: 01/16/20 12:41 PM   Specimen: PATH Cytology Misc. fluid; Body Fluid  Result Value Ref Range   Specimen Description TISSUE    Special Requests LEFT KNEE TISSUE    Gram Stain PENDING    Culture      CULTURE REINCUBATED FOR BETTER GROWTH Performed at [facility], [address] [place], [place]    Report Status PENDING   Glucose, capillary     Status: Abnormal   Collection Time: 01/16/20  2:52 PM  Result Value Ref Range   Glucose-Capillary 162 (H) 70 - 99 mg/dL  Hemoglobin and hematocrit, blood     Status: Abnormal   Collection Time: 01/16/20  6:23 PM  Result Value Ref Range   Hemoglobin 9.2 (L) 13.0 - 17.0 g/dL   HCT 01/18/20 (L) 39 - 52 %  Glucose, capillary     Status: Abnormal   Collection Time: 01/16/20  8:36 PM  Result Value Ref Range   Glucose-Capillary 224 (H) 70 - 99 mg/dL  Basic metabolic panel     Status: Abnormal   Collection Time: 01/17/20  1:08 AM  Result Value Ref Range   Sodium 136 135 - 145 mmol/L   Potassium 4.2 3.5 - 5.1 mmol/L   Chloride 105 98 - 111 mmol/L   CO2 24 22 - 32 mmol/L   Glucose, Bld 242 (H) 70 - 99 mg/dL   BUN 14 8 - 23 mg/dL   Creatinine, Ser  01/19/20 0.61 - 1.24 mg/dL   Calcium 7.8 (L) 8.9 - 10.3 mg/dL   GFR calc non Af Amer 60 (L) >60 mL/min   GFR calc Af Amer >60 >60 mL/min   Anion gap 7 5 - 15  CBC     Status: Abnormal   Collection Time: 01/17/20  1:08 AM  Result Value Ref Range   WBC 13.4 (H) 4.0 - 10.5 K/uL   RBC 2.98 (L) 4.22 - 5.81 MIL/uL   Hemoglobin 8.3 (L) 13.0 - 17.0 g/dL   HCT 01/19/20 (L) 39 - 52 %   MCV 83.6 80.0 - 100.0 fL   MCH 27.9 26.0 - 34.0 pg   MCHC 33.3 30.0 - 36.0 g/dL   RDW 46.2 (H) 70.3 - 50.0 %   Platelets 314 150 - 400 K/uL   nRBC 0.0 0.0 - 0.2 %  Magnesium     Status: None   Collection Time: 01/17/20  1:08 AM  Result Value Ref Range   Magnesium 2.0 1.7 - 2.4 mg/dL  Glucose, capillary     Status: Abnormal   Collection Time: 01/17/20  1:31 AM  Result Value  Ref Range   Glucose-Capillary 238 (H) 70 - 99 mg/dL  Glucose, capillary     Status: Abnormal   Collection Time: 01/17/20  5:34 AM  Result Value Ref Range   Glucose-Capillary 180 (H) 70 - 99 mg/dL  Glucose, capillary     Status: Abnormal   Collection Time: 01/17/20  7:49 AM  Result Value Ref Range   Glucose-Capillary 171 (H) 70 - 99 mg/dL  Hemoglobin and hematocrit, blood     Status: Abnormal   Collection Time: 01/17/20 10:10 AM  Result Value Ref Range   Hemoglobin 8.1 (L) 13.0 - 17.0 g/dL   HCT 57.0 (L) 39 - 52 %  Glucose, capillary     Status: Abnormal   Collection Time: 01/17/20 12:18 PM  Result Value Ref Range   Glucose-Capillary 227 (H) 70 - 99 mg/dL    No results found.  Assessment/Plan: 1 Day Post-Op   Principal Problem:   Melena Active Problems:   CAD, multiple vessel   S/P CABG x 2   Atrial fibrillation status post cardioversion 10/22/19 [facility])   Essential hypertension   DM (diabetes mellitus), type 2 (HCC)   Symptomatic anemia   Acute blood loss anemia   Suspect Septic arthritis (HCC)   Chronic anticoagulation   History of bilateral knee replacement   Chronic kidney disease, stage 3a   Acute on chronic congestive  heart failure (HCC)   Elevated troponin   Intestinal bypass or anastomosis status   Diverticulosis of colon without hemorrhage   Advance diet Up with therapy  Discharge per med Remain in immobilizer with dressing covered Will continue to monitor   Altamese Cabal , PA-C 01/17/2020, 12:30 PM

## 2020-01-17 NOTE — Progress Notes (Signed)
Pharmacy Antibiotic Note  Randall Vincent is a 79 y.o. male admitted on 01/10/2020 with septic arthritis.  Pharmacy has been consulted for  Vancomycin  dosing.  Plan: Will continue vancomycin 1500mg  IV every 24 hours. (if Scr continues to improve will consider 1000mg  q12h)  Pharmacy will continue to monitor renal function and adjust as needed.    Height: 5\' 8"  (172.7 cm) Weight: 127.9 kg (282 lb) IBW/kg (Calculated) : 68.4  Temp (24hrs), Avg:97.9 F (36.6 C), Min:97.6 F (36.4 C), Max:98.3 F (36.8 C)  Recent Labs  Lab 01/10/20 1837 01/10/20 2028 01/11/20 0324 01/13/20 0444 01/14/20 0524 01/15/20 0419 01/16/20 0239 01/17/20 0108  WBC 18.1*  --    < > 13.1* 12.1* 11.4* 10.3 13.4*  CREATININE 1.77*  --    < > 1.47* 1.55* 1.31* 1.21 1.16  LATICACIDVEN 2.0* 1.3  --   --   --   --   --   --    < > = values in this interval not displayed.    Estimated Creatinine Clearance: 68.4 mL/min (by C-G formula based on SCr of 1.16 mg/dL).    No Known Allergies  Antimicrobials this admission: 8/20 Zosyn>> 8/25 8/21 Vancomycin >>  8/26 Ceftriaxone>>   Microbiology results: 8/21 Knee Fluid Culture: Enteroccus faecalis  8/21 Bcx: NG Final   Thank you for allowing pharmacy to be a part of this patient's care.  9/26, PharmD, BCPS Clinical Pharmacist 01/17/2020 8:45 AM

## 2020-01-17 NOTE — Anesthesia Postprocedure Evaluation (Signed)
Anesthesia Post Note  Patient: Randall Vincent  Procedure(s) Performed: TOTAL KNEE REVISION (Left Knee)  Patient location during evaluation: PACU Anesthesia Type: Combined General/Spinal Level of consciousness: awake and alert Pain management: pain level controlled Vital Signs Assessment: post-procedure vital signs reviewed and stable Respiratory status: spontaneous breathing, nonlabored ventilation, respiratory function stable and patient connected to nasal cannula oxygen Cardiovascular status: blood pressure returned to baseline and stable Postop Assessment: no apparent nausea or vomiting Anesthetic complications: no   No complications documented.   Last Vitals:  Vitals:   01/17/20 0746 01/17/20 0800  BP:  (!) 136/51  Pulse:  75  Resp:  20  Temp: 36.8 C   SpO2:  94%    Last Pain:  Vitals:   01/17/20 0756  TempSrc:   PainSc: 1                  Corinda Gubler

## 2020-01-17 NOTE — Progress Notes (Addendum)
PROGRESS NOTE    Randall Vincent   WGN:[id number]  DOB: 03-May-1941  PCP: Elspeth Cho., MD    DOA: 01/10/2020 LOS: 7   Brief Narrative   Randall Vincent is a 79 y.o. male who complains of left knee pain and swelling for the last few days.  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  He has never had problems with the left knee until recently.  He was seen in our office at [facility] earlier this week complaining of pain and swelling.  He was scheduled for a bone scan to evaluate him for prosthetic loosening versus infection by Dr. Odis Luster.  However he developed GI bleeding in the last day or so and has presented to the emergency room for this reason primarily.  Hemoglobin is down to 7.2 from a high of 13.5 earlier this year.  White blood count is 18,100.  Temperature is about 100 F.  The left knee pain and swelling was noted as well as well as a slightly elevated white count and mild fever.  He had cardioversion in June and July and has remained in sinus rhythm he says.  He was switched from Eliquis to Pradaxa at that time and took his last Pradaxa dose yesterday.       Assessment & Plan   Principal Problem:   Melena Active Problems:   CAD, multiple vessel   S/P CABG x 2   Atrial fibrillation status post cardioversion 10/22/19 [facility])   Essential hypertension   DM (diabetes mellitus), type 2 (HCC)   Symptomatic anemia   Acute blood loss anemia   Suspect Septic arthritis (HCC)   Chronic anticoagulation   History of bilateral knee replacement   Chronic kidney disease, stage 3a   Acute on chronic congestive heart failure (HCC)   Elevated troponin   Intestinal bypass or anastomosis status   Diverticulosis of colon without hemorrhage   Acute blood loss anemiasecondary GI bleeding - presented with melena in setting of anticoagulation.  EGD on 8/23  showed possible Barrett's esophagus changes (biopsy contraindicated with bleeding), erosive gastropathy but no signs of recent of active bleeding.  Colonoscopy on 8/25 showed diverticulosis at the colonic anastamosis, patent surgical anastamosis, non-bleeding internal hemorrhoids.   Hbg trend: lowest was 6.4 on 8/21. This AM 8.3 >> 8.1 (Transfused 1 unit RBC's in surgery 8/26)  --GI following - follow up in clinic in 2-4 weeks -Hold pradaxa and all blood thinners for now --d/c IV Protonix --not on PPI at home, so will not start at this time, consider in outpatient setting if indicated -Serial H&H's q8h --Transfuse if Hbg < 7.0 or active bleeding -Monitor closely. Currently hemodynamically stable.  Page GI if active bleeding or unstable.   Septic arthritis of left knee / History of bilateral knee replacement - POA.  History of knee replacement several years ago presenting with a several week history of worsening pain and swelling of the left knee.   --Ortho is consulted --Had surgery 8/26, removed L knee prosthesis and placed antibiotic spacer --ID is following --Started on Vancomycin and Rocephin empirically --Follow up operative tissue cultures   Mild acute on chronic systolic CHF - presented with mildshortness of breathabove baseline, pulmonary vascular congestion on chest x-ray, BNP elevated at 400, all consistent with decompensated CHF (but SOB could be due to anemia as well).  No prior  echo's at time of admission. Echo on 8/21 showed EF 50-55%, diastolic parameters not evaluated.   -Holding beta-blocker and ACE inhibitor in the setting of GI bleed and intermittent hypotension -Will start PO Lasix 40 mg daily as he is now with progressive edema, was euvolemic earlier this week --Monitor BP, electrolytes and renal function closely   Elevated troponin - POA, secondary to demand ischemia in setting of anemia and renal failure reducing clearance.   --Monitor for chest pain and get  stat EKG and troponin as needed.  CAD, multiple vessel, S/P CABG x 2 - stable.   -Holding aspirin and metoprolol for now  --Continue statin, aspirin held, continue diltiazem as BP tolerates --Hold Aldactone due to renal failure  Atrial fibrillation status post cardioversion 10/22/19.  Currently in sinus rhythm.  Recently switched from Pradaxa to Eliquis on 12/18/2019 due to concern for renal function.  Pradaxa discontinued for now due to GI bleeding.  Essential hypertension - chronic.  Continued on home Diltiazem for A-fib and BP controlled.   Hold ramipril and aldactione due to AKI.  Monitor BP closely.  Type 2 Diabetes - well controlled, A1C is 6.7%.   -CBG's every 4 while n.p.o. with sliding scale coverage. --Home regimen is weekly Ozempic only (takes injection on Thursdays).  --Metformin previously discontinued due to recent renal insufficiency.  Acute kidney injury - POA, resolved.  Likely pre-renal in setting of bleeding and hypotension.  Avoid nephrotoxins and hypotension.  Renally dose meds as indicated.   GFR at baseline. Creatinine trends as follows: 1.77 >> 1.79 >> 1.71 >> 1.47 >> 1.55 >> 1.31 >> 1.21 >> 1.16 today  Obesity: Body mass index is 42.88 kg/m.  Complicates overall care and prognosis.  DVT prophylaxis: SCDs Start: 01/10/20 2307   Diet:  Diet Orders (From admission, onward)    Start     Ordered   01/16/20 1620  Diet regular Room service appropriate? Yes; Fluid consistency: Thin  Diet effective now       Question Answer Comment  Room service appropriate? Yes   Fluid consistency: Thin      01/16/20 1619            Code Status: Full Code    Subjective 01/17/20    Patient seen with son at bedside today.  Reports having left knee pain post-op today, but fairly well managed with pain medication.  Had just worked with PT, said it went as well as can be expected.  Says he needs a LOT of help right now with mobility.  Denies fever/chills, bleeding, chest pain,  SOB or other complaints.     Disposition Plan & Communication   Status is: Inpatient  Remains inpatient appropriate because:Ongoing diagnostic testing needed not appropriate for outpatient work up.  Awaiting operative cultures for septic knee.  Worsening anemia.    Dispo: The patient is from: Home              Anticipated d/c is to: Home              Anticipated d/c date is: 2 days              Patient currently is not medically stable to d/c.    Family Communication: son updated at bedside during encounter 8/27   Consults, Procedures, Significant Events   Consultants:   Orthopedics  Infectious Disease  GI  Procedures:   EGD  Colonoscopy   Objective   Vitals:   01/17/20 0600 01/17/20 0746 01/17/20  0800 01/17/20 1200  BP: (!) 143/54  (!) 136/51 (!) 151/51  Pulse: 73  75 87  Resp: (!) Temp:  98.3 F (36.8 C)    TempSrc:  Oral    SpO2: 92%  94% 95%  Weight:      Height:        Intake/Output Summary (Last 24 hours) at 01/17/2020 1523 Last data filed at 01/17/2020 1428 Gross per 24 hour  Intake 1098.58 ml  Output 1450 ml  Net -351.42 ml   Filed Weights   01/14/20 0509 01/16/20 0400 01/17/20 0358  Weight: 122 kg 120.6 kg 127.9 kg    Physical Exam:  General exam: awake, alert, no acute distress, obese Respiratory system: CTAB, no wheezes, rales or rhonchi, normal respiratory effort. Cardiovascular system: normal S1/S2, RRR, pitting edema of R lower extremity to below knee Gastrointestinal system: soft, NT, ND, positive bowel sounds Central nervous system: A&O x4. no gross focal neurologic deficits, normal speech Extremities: edema in B/L upper extremities, LLE in brace, RLE edema as above Psychiatry: normal mood, congruent affect, judgement and insight appear normal  Labs   Data Reviewed: I have personally reviewed following labs and imaging studies  CBC: Recent Labs  Lab 01/10/20 1837 01/11/20 0859 01/13/20 0444 01/13/20 0917  01/14/20 0524 01/14/20 0524 01/15/20 0419 01/15/20 1828 01/16/20 0239 01/16/20 0956 01/16/20 1823 01/17/20 0108 01/17/20 1010  WBC 18.1*   < > 13.1*  --  12.1*  --  11.4*  --  10.3  --   --  13.4*  --   NEUTROABS 15.7*  --   --   --   --   --   --   --   --   --   --   --   --   HGB 8.0*   < > 8.5*   < > 8.5*   < > 7.9*   < > 7.6* 8.3* 9.2* 8.3* 8.1*  HCT 25.7*   < > 26.0*   < > 27.3*   < > 25.2*   < > 24.2* 26.7* 28.3* 24.9* 25.4*  MCV 84.0   < > 83.6  --  86.4  --  86.0  --  85.8  --   --  83.6  --   PLT 426*   < > 336  --  352  --  344  --  320  --   --  314  --    < > = values in this interval not displayed.   Basic Metabolic Panel: Recent Labs  Lab 01/13/20 0444 01/14/20 0524 01/15/20 0419 01/16/20 0239 01/17/20 0108  NA 133* 136 137 137 136  K 4.7 4.3 4.2 3.9 4.2  CL 107 106 109 107 105  CO2 17* 20* 21* 22 24  GLUCOSE 128* 150* 119* 127* 242*  BUN 25* CREATININE 1.47* 1.55* 1.31* 1.21 1.16  CALCIUM 8.0* 8.4* 7.7* 7.5* 7.8*  MG 2.0  --   --  1.7 2.0   GFR: Estimated Creatinine Clearance: 68.4 mL/min (by C-G formula based on SCr of 1.16 mg/dL). Liver Function Tests: No results for input(s): AST, ALT, ALKPHOS, BILITOT, PROT, ALBUMIN in the last 168 hours. No results for input(s): LIPASE, AMYLASE in the last 168 hours. No results for input(s): AMMONIA in the last 168 hours. Coagulation Profile: Recent Labs  Lab 01/10/20 1837 01/11/20 0324  INR 1.9* 1.7*   Cardiac Enzymes: No results for input(s): CKTOTAL,  CKMB, CKMBINDEX, TROPONINI in the last 168 hours. BNP (last 3 results) No results for input(s): PROBNP in the last 8760 hours. HbA1C: No results for input(s): HGBA1C in the last 72 hours. CBG: Recent Labs  Lab 01/16/20 2036 01/17/20 0131 01/17/20 0534 01/17/20 0749 01/17/20 1218  GLUCAP 224* 238* 180* 171* 227*   Lipid Profile: No results for input(s): CHOL, HDL, LDLCALC, TRIG, CHOLHDL, LDLDIRECT in the last 72 hours. Thyroid Function  Tests: No results for input(s): TSH, T4TOTAL, FREET4, T3FREE, THYROIDAB in the last 72 hours. Anemia Panel: No results for input(s): VITAMINB12, FOLATE, FERRITIN, TIBC, IRON, RETICCTPCT in the last 72 hours. Sepsis Labs: Recent Labs  Lab 01/10/20 1837 01/10/20 2028 01/11/20 0324  PROCALCITON  --   --  0.11  LATICACIDVEN 2.0* 1.3  --     Recent Results (from the past 240 hour(s))  Culture, blood (routine x 2)     Status: None   Collection Time: 01/11/20 12:37 AM   Specimen: BLOOD  Result Value Ref Range Status   Specimen Description BLOOD RIGHT ANTECUBITAL  Final   Special Requests   Final    BOTTLES DRAWN AEROBIC AND ANAEROBIC Blood Culture results may not be optimal due to an excessive volume of blood received in culture bottles   Culture   Final    NO GROWTH 5 DAYS Performed at [facility], [address] [place], [place] [address]    Report Status 01/16/2020 FINAL  Final  Culture, blood (routine x 2)     Status: None   Collection Time: 01/11/20 12:37 AM   Specimen: BLOOD  Result Value Ref Range Status   Specimen Description BLOOD LEFT ANTECUBITAL  Final   Special Requests   Final    BOTTLES DRAWN AEROBIC AND ANAEROBIC Blood Culture results may not be optimal due to an excessive volume of blood received in culture bottles   Culture   Final    NO GROWTH 5 DAYS Performed at [facility], [address], [place], [place] [address]    Report Status 01/16/2020 FINAL  Final  SARS Coronavirus 2 by RT PCR (hospital order, performed in [facility] hospital lab) Nasopharyngeal Nasopharyngeal Swab     Status: None   Collection Time: 01/11/20  6:14 AM   Specimen: Nasopharyngeal Swab  Result Value Ref Range Status   SARS Coronavirus 2 NEGATIVE NEGATIVE Final    Comment: (NOTE) SARS-CoV-2 target nucleic acids are NOT DETECTED.  The SARS-CoV-2 RNA is generally detectable in upper and lower respiratory specimens during the acute phase of infection. The  lowest concentration of SARS-CoV-2 viral copies this assay can detect is 250 copies / mL. A negative result does not preclude SARS-CoV-2 infection and should not be used as the sole basis for treatment or other patient management decisions.  A negative result may occur with improper specimen collection / handling, submission of specimen other than nasopharyngeal swab, presence of viral mutation(s) within the areas targeted by this assay, and inadequate number of viral copies (<250 copies / mL). A negative result must be combined with clinical observations, patient history, and epidemiological information.  Fact Sheet for Patients:   BoilerBrush.com.cy  Fact Sheet for Healthcare Providers: https://pope.com/  This test is not yet approved or  cleared by the [place] FDA and has been authorized for detection and/or diagnosis of SARS-CoV-2 by FDA under an Emergency Use Authorization (EUA).  This EUA will remain in effect (meaning this test can be used) for the duration of the  COVID-19 declaration under Section 564(b)(1) of the Act, 21 U.S.C. section 360bbb-3(b)(1), unless the authorization is terminated or revoked sooner.  Performed at [facility], [address] [place], [place] [address]   Culture, body fluid-bottle     Status: Abnormal   Collection Time: 01/11/20  6:07 PM   Specimen: Fluid  Result Value Ref Range Status   Specimen Description FLUID KNEE  Final   Special Requests   Final    BOTTLES DRAWN AEROBIC AND ANAEROBIC Blood Culture adequate volume   Gram Stain   Final    GRAM POSITIVE COCCI IN CLUSTERS ANAEROBIC BOTTLE ONLY CRITICAL RESULT CALLED TO, READ BACK BY AND VERIFIED WITH: RN Sheria Lang [id number] FCP NO ORGANISMS SEEN AEROBIC BOTTLE ONLY Performed at [facility], [address] [place], [place] [address]    Culture ENTEROCOCCUS FAECALIS (A)  Final   Report Status 01/15/2020 FINAL   Final   Organism ID, Bacteria ENTEROCOCCUS FAECALIS  Final      Susceptibility   Enterococcus faecalis - MIC*    AMPICILLIN <=2 SENSITIVE Sensitive     VANCOMYCIN 1 SENSITIVE Sensitive     GENTAMICIN SYNERGY SENSITIVE Sensitive     * ENTEROCOCCUS FAECALIS  Gram stain     Status: None   Collection Time: 01/11/20  6:07 PM   Specimen: Fluid  Result Value Ref Range Status   Specimen Description FLUID KNEE  Final   Special Requests NONE  Final   Gram Stain   Final    FEW WBC PRESENT,BOTH PMN AND MONONUCLEAR NO ORGANISMS SEEN Performed at [facility], [address], [place], [place] [address]    Report Status 01/12/2020 FINAL  Final  Aerobic/Anaerobic Culture (surgical/deep wound)     Status: None (Preliminary result)   Collection Time: 01/16/20 12:41 PM   Specimen: PATH Cytology Misc. fluid; Body Fluid  Result Value Ref Range Status   Specimen Description TISSUE  Final   Special Requests LEFT KNEE TISSUE  Final   Gram Stain   Final    FEW WBC PRESENT,BOTH PMN AND MONONUCLEAR NO ORGANISMS SEEN    Culture   Final    CULTURE REINCUBATED FOR BETTER GROWTH Performed at [facility], [address] [place], [place] [address]    Report Status PENDING  Incomplete      Imaging Studies   No results found.   Medications   Scheduled Meds: . sodium chloride   Intravenous Once  . amiodarone  200 mg Oral Daily  . atorvastatin  40 mg Oral Daily  . Chlorhexidine Gluconate Cloth  6 each Topical Daily  . diltiazem  60 mg Oral Q8H  . insulin aspart  0-15 Units Subcutaneous Q4H  . polyethylene glycol  17 g Oral Daily  . vitamin B-12  1,000 mcg Oral Daily   Continuous Infusions: . sodium chloride 75 mL/hr at 01/17/20 0539  . sodium chloride 500 mL (01/14/20 1643)  . cefTRIAXone (ROCEPHIN)  IV 2 g (01/16/20 1845)  . vancomycin 1,500 mg (01/17/20 1233)       LOS: 7 days    Time spent: 25 minutes    Pennie Banter, DO [facility]  01/17/2020, 3:23 PM     If 7PM-7AM, please contact night-coverage. How to contact the [facility] Attending or Consulting provider 7A - 7P or covering provider during after hours 7P -7A, for this patient?    1. Check the care team in [facility] and look for a) attending/consulting [facility]  provider listed and b) the [facility] team listed 2. Log into [email or web address] and use [place]'s universal password to access. If you do not have the password, please contact the hospital operator. 3. Locate the [facility] provider you are looking for under [facility] and page to a number that you can be directly reached. 4. If you still have difficulty reaching the provider, please page the [facility] (Director [facility] Call) for the Hospitalists listed on amion for assistance.

## 2020-01-18 DIAGNOSIS — D649 Anemia, unspecified: Secondary | ICD-10-CM

## 2020-01-18 LAB — CBC
HCT: 22.9 % — ABNORMAL LOW (ref 39.0–52.0)
Hemoglobin: 7.4 g/dL — ABNORMAL LOW (ref 13.0–17.0)
MCH: 27 pg (ref 26.0–34.0)
MCHC: 32.3 g/dL (ref 30.0–36.0)
MCV: 83.6 fL (ref 80.0–100.0)
Platelets: 334 10*3/uL (ref 150–400)
RBC: 2.74 MIL/uL — ABNORMAL LOW (ref 4.22–5.81)
RDW: 16.7 % — ABNORMAL HIGH (ref 11.5–15.5)
WBC: 13 10*3/uL — ABNORMAL HIGH (ref 4.0–10.5)
nRBC: 0 % (ref 0.0–0.2)

## 2020-01-18 LAB — BASIC METABOLIC PANEL
Anion gap: 7 (ref 5–15)
Anion gap: 10 (ref 5–15)
BUN: 11 mg/dL (ref 8–23)
BUN: 11 mg/dL (ref 8–23)
CO2: 24 mmol/L (ref 22–32)
CO2: 20 mmol/L — ABNORMAL LOW (ref 22–32)
Calcium: 7.5 mg/dL — ABNORMAL LOW (ref 8.9–10.3)
Calcium: 7.8 mg/dL — ABNORMAL LOW (ref 8.9–10.3)
Chloride: 104 mmol/L (ref 98–111)
Chloride: 105 mmol/L (ref 98–111)
Creatinine, Ser: 1.41 mg/dL — ABNORMAL HIGH (ref 0.61–1.24)
Creatinine, Ser: 1.26 mg/dL — ABNORMAL HIGH (ref 0.61–1.24)
GFR calc Af Amer: 55 mL/min — ABNORMAL LOW (ref >60)
GFR calc Af Amer: >60 mL/min (ref >60)
GFR calc non Af Amer: 47 mL/min — ABNORMAL LOW (ref >60)
GFR calc non Af Amer: 54 mL/min — ABNORMAL LOW (ref >60)
Glucose, Bld: 159 mg/dL — ABNORMAL HIGH (ref 70–99)
Glucose, Bld: 165 mg/dL — ABNORMAL HIGH (ref 70–99)
Potassium: 4.3 mmol/L (ref 3.5–5.1)
Potassium: 3.6 mmol/L (ref 3.5–5.1)
Sodium: 135 mmol/L (ref 135–145)
Sodium: 135 mmol/L (ref 135–145)

## 2020-01-18 LAB — HEMOGLOBIN AND HEMATOCRIT, BLOOD
HCT: 22.9 % — ABNORMAL LOW (ref 39.0–52.0)
HCT: 24.4 % — ABNORMAL LOW (ref 39.0–52.0)
Hemoglobin: 7.6 g/dL — ABNORMAL LOW (ref 13.0–17.0)
Hemoglobin: 7.9 g/dL — ABNORMAL LOW (ref 13.0–17.0)

## 2020-01-18 LAB — GLUCOSE, CAPILLARY
Glucose-Capillary: 143 mg/dL — ABNORMAL HIGH (ref 70–99)
Glucose-Capillary: 134 mg/dL — ABNORMAL HIGH (ref 70–99)
Glucose-Capillary: 137 mg/dL — ABNORMAL HIGH (ref 70–99)
Glucose-Capillary: 187 mg/dL — ABNORMAL HIGH (ref 70–99)
Glucose-Capillary: 191 mg/dL — ABNORMAL HIGH (ref 70–99)

## 2020-01-18 LAB — MAGNESIUM: Magnesium: 1.7 mg/dL (ref 1.7–2.4)

## 2020-01-18 MED ORDER — ACETAMINOPHEN 325 MG PO TABS: 325.0000 mg | ORAL_TABLET | Freq: Four times a day (QID) | ORAL | Status: DC | PRN

## 2020-01-18 MED ORDER — BISACODYL 10 MG RE SUPP
10.0000 mg | Freq: Every day | RECTAL | Status: DC | PRN
  Administered 2020-01-22: 10 mg via RECTAL
  Filled 2020-01-18: qty 1

## 2020-01-18 MED ORDER — METOCLOPRAMIDE HCL 10 MG PO TABS: 5.0000 mg | ORAL_TABLET | Freq: Three times a day (TID) | ORAL | Status: DC | PRN

## 2020-01-18 MED ORDER — METOCLOPRAMIDE HCL 5 MG/ML IJ SOLN: 5.0000 mg | Freq: Three times a day (TID) | INTRAMUSCULAR | Status: DC | PRN

## 2020-01-18 MED ORDER — ACETAMINOPHEN 500 MG PO TABS
500.0000 mg | ORAL_TABLET | Freq: Four times a day (QID) | ORAL | Status: AC
  Administered 2020-01-19 (×2): 500 mg via ORAL
  Filled 2020-01-18 (×2): qty 1

## 2020-01-18 MED ORDER — KETOROLAC TROMETHAMINE 15 MG/ML IJ SOLN
15.0000 mg | Freq: Four times a day (QID) | INTRAMUSCULAR | Status: AC
  Administered 2020-01-19 (×2): 15 mg via INTRAVENOUS
  Filled 2020-01-18 (×2): qty 1

## 2020-01-18 MED ORDER — DOCUSATE SODIUM 100 MG PO CAPS
100.0000 mg | ORAL_CAPSULE | Freq: Two times a day (BID) | ORAL | Status: DC
  Administered 2020-01-18 – 2020-01-22 (×8): 100 mg via ORAL
  Filled 2020-01-18 (×8): qty 1

## 2020-01-18 MED ORDER — DIPHENHYDRAMINE HCL 12.5 MG/5ML PO ELIX
12.5000 mg | ORAL_SOLUTION | ORAL | Status: DC | PRN
  Filled 2020-01-18: qty 10

## 2020-01-18 MED ORDER — LACTATED RINGERS IV SOLN: INTRAVENOUS | Status: DC

## 2020-01-18 MED ORDER — PHENOL 1.4 % MT LIQD
1.0000 | OROMUCOSAL | Status: DC | PRN
  Filled 2020-01-18: qty 177

## 2020-01-18 MED ORDER — MAGNESIUM SULFATE 2 GM/50ML IV SOLN
2.0000 g | Freq: Once | INTRAVENOUS | Status: AC
  Administered 2020-01-18: 2 g via INTRAVENOUS
  Filled 2020-01-18: qty 50

## 2020-01-18 MED ORDER — MAGNESIUM CITRATE PO SOLN
1.0000 | Freq: Once | ORAL | Status: DC | PRN
  Filled 2020-01-18: qty 296

## 2020-01-18 MED ORDER — MENTHOL 3 MG MT LOZG
1.0000 | LOZENGE | OROMUCOSAL | Status: DC | PRN
  Filled 2020-01-18: qty 9

## 2020-01-18 MED ORDER — ALUM & MAG HYDROXIDE-SIMETH 200-200-20 MG/5ML PO SUSP: 30.0000 mL | ORAL | Status: DC | PRN

## 2020-01-18 NOTE — Progress Notes (Signed)
PROGRESS NOTE    Randall Vincent   NWG:[id number]  DOB: 1940-07-22  PCP: Elspeth Cho., MD    DOA: 01/10/2020 LOS: 8   Brief Narrative   Randall Vincent is a 79 y.o. male who complains of left knee pain and swelling for the last few days.  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  He has never had problems with the left knee until recently.  He was seen in our office at [facility] earlier this week complaining of pain and swelling.  He was scheduled for a bone scan to evaluate him for prosthetic loosening versus infection by Dr. Odis Luster.  However he developed GI bleeding in the last day or so and has presented to the emergency room for this reason primarily.  Hemoglobin is down to 7.2 from a high of 13.5 earlier this year.  White blood count is 18,100.  Temperature is about 100 F.  The left knee pain and swelling was noted as well as well as a slightly elevated white count and mild fever.  He had cardioversion in June and July and has remained in sinus rhythm he says.  He was switched from Eliquis to Pradaxa at that time and took his last Pradaxa dose yesterday.       Assessment & Plan   Principal Problem:   Melena Active Problems:   CAD, multiple vessel   S/P CABG x 2   Atrial fibrillation status post cardioversion 10/22/19 [facility])   Essential hypertension   DM (diabetes mellitus), type 2 (HCC)   Symptomatic anemia   Acute blood loss anemia   Suspect Septic arthritis (HCC)   Chronic anticoagulation   History of bilateral knee replacement   Chronic kidney disease, stage 3a   Acute on chronic congestive heart failure (HCC)   Elevated troponin   Intestinal bypass or anastomosis status   Diverticulosis of colon without hemorrhage   Acute blood loss anemiasecondary GI bleeding - presented with melena in setting of anticoagulation.   EGD on 8/23  showed possible Barrett's esophagus changes (biopsy contraindicated with bleeding), erosive gastropathy but no signs of recent of active bleeding.   Colonoscopy on 8/25 showed diverticulosis at the colonic anastamosis, patent surgical anastamosis, non-bleeding internal hemorrhoids.   Hbg trend: lowest was 6.4 on 8/21.  Past 48 hours Hbg 8.3 >> 8.1 >> 7.7 >> 7.4 >> 7.6 --GI following - follow up in clinic in 2-4 weeks -Hold pradaxa and all blood thinners for now --d/c IV Protonix --not on PPI at home, so will not start at this time, consider in outpatient setting if indicated -Serial H&H's q8h --Transfuse if Hbg < 7.0 or active bleeding -Monitor closely. Currently hemodynamically stable.  Page GI if active bleeding or unstable.   Septic arthritis of left knee / History of bilateral knee replacement - POA.  History of knee replacement several years ago presenting with a several week history of worsening pain and swelling of the left knee.   --Ortho is consulted --Had surgery 8/26, removed L knee prosthesis and placed antibiotic spacer --ID is following --Started on Vancomycin and Rocephin empirically --Follow up operative tissue cultures   Mild acute on chronic systolic CHF - presented with mildshortness of breathabove baseline, pulmonary vascular congestion on chest x-ray, BNP elevated at 400, all consistent with decompensated CHF (but SOB could be due to anemia as well).  No prior echo's at time of admission. Echo on 8/21 showed EF 50-55%, diastolic parameters not evaluated.   -Holding beta-blocker and ACE inhibitor in the setting of GI bleed and intermittent hypotension -Continue PO Lasix 40 mg daily  --Monitor BP, electrolytes and renal function closely   Elevated troponin - POA, secondary to demand ischemia in setting of anemia and renal failure reducing clearance.   --Monitor for chest pain and get stat EKG and troponin as needed.  CAD, multiple vessel, S/P CABG x 2 - stable.    -Holding aspirin and metoprolol for now  --Continue statin, aspirin held, continue diltiazem as BP tolerates --Hold Aldactone due to renal failure  Atrial fibrillation status post cardioversion 10/22/19.  Currently in sinus rhythm.  Recently switched from Pradaxa to Eliquis on 12/18/2019 due to concern for renal function.  Pradaxa discontinued for now due to GI bleeding.  Essential hypertension - chronic.  Continued on home Diltiazem for A-fib and BP controlled.   Hold ramipril and aldactione due to AKI.  Monitor BP closely.  Type 2 Diabetes - well controlled, A1C is 6.7%.   -CBG's every 4 while n.p.o. with sliding scale coverage. --Home regimen is weekly Ozempic only (takes injection on Thursdays).  --Metformin previously discontinued due to recent renal insufficiency.  Acute kidney injury - POA, resolved.  Likely pre-renal in setting of bleeding and hypotension.  Avoid nephrotoxins and hypotension.  Renally dose meds as indicated.   GFR at baseline. Creatinine trends as follows: 1.77 >> 1.79 >> 1.71 >> 1.47 >> 1.55 >> 1.31 >> 1.21 >> 1.16 today  Obesity: Body mass index is 43 kg/m.  Complicates overall care and prognosis.  DVT prophylaxis: SCDs Start: 01/10/20 2307   Diet:  Diet Orders (From admission, onward)    Start     Ordered   01/16/20 1620  Diet regular Room service appropriate? Yes; Fluid consistency: Thin  Diet effective now       Question Answer Comment  Room service appropriate? Yes   Fluid consistency: Thin      01/16/20 1619            Code Status: Full Code    Subjective 01/18/20    Patient seen at bedside this morning.  Reports that his left lower extremity brace is loose after improvement in swelling with Lasix given yesterday.  He is having significant pain with any bed mobility and was unable to work with PT due to this.   He otherwise denies complaints.  Denies fever/chills, bleeding, chest pain, SOB or other complaints.     Disposition Plan &  Communication   Status is: Inpatient  Remains inpatient appropriate because:Ongoing diagnostic testing needed not appropriate for outpatient work up.  Awaiting operative cultures for septic knee.  Worsening anemia.    Dispo: The patient is from: Home              Anticipated d/c is to: Home              Anticipated d/c date is: 2 days              Patient currently is not medically stable to d/c.    Family Communication: none at bedside during encounter, son updated on rounds at bedside yesterday    Consults, Procedures, Significant Events   Consultants:   Orthopedics  Infectious Disease  GI  Procedures:   EGD  Colonoscopy   Objective   Vitals:   01/17/20 1912 01/18/20 0315 01/18/20 0535 01/18/20 1610  BP: (!) 155/60 (!) 165/54  (!) 151/57  Pulse: 93 89  82  Resp: (!) 21 (!) 21  17  Temp: 98.7 F (37.1 C) 98.4 F (36.9 C) 98.7 F (37.1 C) 98.2 F (36.8 C)  TempSrc: Oral Oral Oral Oral  SpO2: 95% 94%  94%  Weight:  128.3 kg    Height:        Intake/Output Summary (Last 24 hours) at 01/18/2020 0815 Last data filed at 01/18/2020 0600 Gross per 24 hour  Intake 1794.78 ml  Output 1250 ml  Net 544.78 ml   Filed Weights   01/16/20 0400 01/17/20 0358 01/18/20 0315  Weight: 120.6 kg 127.9 kg 128.3 kg    Physical Exam:  General exam: awake, alert, no acute distress, obese Respiratory system: CTAB, no wheezes, rales or rhonchi, normal respiratory effort. Cardiovascular system: normal S1/S2, RRR, improved lower extremity edema still trace to 1+ Gastrointestinal system: soft, NT, ND, positive bowel sounds Central nervous system: A&O x4. no gross focal neurologic deficits, normal speech Extremities: Marked improvement of edema in B/L upper extremities, LLE in brace with good distal pulse and sensation, RLE edema as above Psychiatry: normal mood, congruent affect, judgement and insight appear normal  Labs   Data Reviewed: I have personally reviewed following  labs and imaging studies  CBC: Recent Labs  Lab 01/14/20 0524 01/14/20 0524 01/15/20 0419 01/15/20 1828 01/16/20 0239 01/16/20 0956 01/16/20 1823 01/17/20 0108 01/17/20 1010 01/17/20 1816 01/18/20 0156  WBC 12.1*  --  11.4*  --  10.3  --   --  13.4*  --   --  13.0*  HGB 8.5*   < > 7.9*   < > 7.6*   < > 9.2* 8.3* 8.1* 7.7* 7.4*  HCT 27.3*   < > 25.2*   < > 24.2*   < > 28.3* 24.9* 25.4* 24.6* 22.9*  MCV 86.4  --  86.0  --  85.8  --   --  83.6  --   --  83.6  PLT 352  --  344  --  320  --   --  314  --   --  334   < > = values in this interval not displayed.   Basic Metabolic Panel: Recent Labs  Lab 01/13/20 0444 01/13/20 0444 01/14/20 0524 01/15/20 0419 01/16/20 0239 01/17/20 0108 01/18/20 0156  NA 133*   < > 136 137 137 136 135  K 4.7   < > 4.3 4.2 3.9 4.2 4.3  CL 107   < > 106 109 107 105 104  CO2 17*   < > 20* 21* 22 24 24   GLUCOSE 128*   < > 150* 119* 127* 242* 159*  BUN 25*   < > 22 18 14 14 11   CREATININE 1.47*   < > 1.55* 1.31* 1.21 1.16 1.41*  CALCIUM 8.0*   < > 8.4* 7.7* 7.5* 7.8* 7.5*  MG 2.0  --   --   --  1.7 2.0 1.7   < > = values in this interval not displayed.   GFR: Estimated Creatinine Clearance: 56.4 mL/min (A) (by C-G formula based on SCr of 1.41 mg/dL (H)). Liver Function Tests: No results for input(s): AST, ALT, ALKPHOS, BILITOT, PROT, ALBUMIN in the last 168 hours. No results for input(s): LIPASE, AMYLASE in the last 168 hours. No results for input(s): AMMONIA in the last 168 hours. Coagulation Profile: No results for input(s): INR, PROTIME in the last 168 hours. Cardiac Enzymes:  No results for input(s): CKTOTAL, CKMB, CKMBINDEX, TROPONINI in the last 168 hours. BNP (last 3 results) No results for input(s): PROBNP in the last 8760 hours. HbA1C: No results for input(s): HGBA1C in the last 72 hours. CBG: Recent Labs  Lab 01/17/20 1659 01/17/20 2047 01/17/20 2350 01/18/20 0500 01/18/20 0733  GLUCAP 153* 183* 172* 143* 134*   Lipid  Profile: No results for input(s): CHOL, HDL, LDLCALC, TRIG, CHOLHDL, LDLDIRECT in the last 72 hours. Thyroid Function Tests: No results for input(s): TSH, T4TOTAL, FREET4, T3FREE, THYROIDAB in the last 72 hours. Anemia Panel: No results for input(s): VITAMINB12, FOLATE, FERRITIN, TIBC, IRON, RETICCTPCT in the last 72 hours. Sepsis Labs: No results for input(s): PROCALCITON, LATICACIDVEN in the last 168 hours.  Recent Results (from the past 240 hour(s))  Culture, blood (routine x 2)     Status: None   Collection Time: 01/11/20 12:37 AM   Specimen: BLOOD  Result Value Ref Range Status   Specimen Description BLOOD RIGHT ANTECUBITAL  Final   Special Requests   Final    BOTTLES DRAWN AEROBIC AND ANAEROBIC Blood Culture results may not be optimal due to an excessive volume of blood received in culture bottles   Culture   Final    NO GROWTH 5 DAYS Performed at [facility], [address] [place], [place] [address]    Report Status 01/16/2020 FINAL  Final  Culture, blood (routine x 2)     Status: None   Collection Time: 01/11/20 12:37 AM   Specimen: BLOOD  Result Value Ref Range Status   Specimen Description BLOOD LEFT ANTECUBITAL  Final   Special Requests   Final    BOTTLES DRAWN AEROBIC AND ANAEROBIC Blood Culture results may not be optimal due to an excessive volume of blood received in culture bottles   Culture   Final    NO GROWTH 5 DAYS Performed at [facility], [address], [place], [place] [address]    Report Status 01/16/2020 FINAL  Final  SARS Coronavirus 2 by RT PCR (hospital order, performed in [facility] hospital lab) Nasopharyngeal Nasopharyngeal Swab     Status: None   Collection Time: 01/11/20  6:14 AM   Specimen: Nasopharyngeal Swab  Result Value Ref Range Status   SARS Coronavirus 2 NEGATIVE NEGATIVE Final    Comment: (NOTE) SARS-CoV-2 target nucleic acids are NOT DETECTED.  The SARS-CoV-2 RNA is generally detectable in upper and  lower respiratory specimens during the acute phase of infection. The lowest concentration of SARS-CoV-2 viral copies this assay can detect is 250 copies / mL. A negative result does not preclude SARS-CoV-2 infection and should not be used as the sole basis for treatment or other patient management decisions.  A negative result may occur with improper specimen collection / handling, submission of specimen other than nasopharyngeal swab, presence of viral mutation(s) within the areas targeted by this assay, and inadequate number of viral copies (<250 copies / mL). A negative result must be combined with clinical observations, patient history, and epidemiological information.  Fact Sheet for Patients:   BoilerBrush.com.cy  Fact Sheet for Healthcare Providers: https://pope.com/  This test is not yet approved or  cleared by the [place] FDA and has been authorized for detection and/or diagnosis of SARS-CoV-2 by FDA under an Emergency Use Authorization (EUA).  This EUA will remain in effect (meaning this test can be used) for the duration of the COVID-19 declaration under Section 564(b)(1) of the Act, 21 U.S.C. section 360bbb-3(b)(1),  unless the authorization is terminated or revoked sooner.  Performed at [name], [address] [place], [place] [address]   Culture, body fluid-bottle     Status: Abnormal   Collection Time: 01/11/20  6:07 PM   Specimen: Fluid  Result Value Ref Range Status   Specimen Description FLUID KNEE  Final   Special Requests   Final    BOTTLES DRAWN AEROBIC AND ANAEROBIC Blood Culture adequate volume   Gram Stain   Final    GRAM POSITIVE COCCI IN CLUSTERS ANAEROBIC BOTTLE ONLY CRITICAL RESULT CALLED TO, READ BACK BY AND VERIFIED WITH: RN Sheria LangAMERON [id number] FCP NO ORGANISMS SEEN AEROBIC BOTTLE ONLY Performed at [facility], [address] [place], [place] [address]     Culture ENTEROCOCCUS FAECALIS (A)  Final   Report Status 01/15/2020 FINAL  Final   Organism ID, Bacteria ENTEROCOCCUS FAECALIS  Final      Susceptibility   Enterococcus faecalis - MIC*    AMPICILLIN <=2 SENSITIVE Sensitive     VANCOMYCIN 1 SENSITIVE Sensitive     GENTAMICIN SYNERGY SENSITIVE Sensitive     * ENTEROCOCCUS FAECALIS  Gram stain     Status: None   Collection Time: 01/11/20  6:07 PM   Specimen: Fluid  Result Value Ref Range Status   Specimen Description FLUID KNEE  Final   Special Requests NONE  Final   Gram Stain   Final    FEW WBC PRESENT,BOTH PMN AND MONONUCLEAR NO ORGANISMS SEEN Performed at [facility], [address] [place], [place] [address]    Report Status 01/12/2020 FINAL  Final  Aerobic/Anaerobic Culture (surgical/deep wound)     Status: None (Preliminary result)   Collection Time: 01/16/20 12:41 PM   Specimen: PATH Cytology Misc. fluid; Body Fluid  Result Value Ref Range Status   Specimen Description TISSUE  Final   Special Requests LEFT KNEE TISSUE  Final   Gram Stain   Final    FEW WBC PRESENT,BOTH PMN AND MONONUCLEAR NO ORGANISMS SEEN    Culture   Final    CULTURE REINCUBATED FOR BETTER GROWTH Performed at [facility], [address] [place], [place] [address]    Report Status PENDING  Incomplete      Imaging Studies   No results found.   Medications   Scheduled Meds: . sodium chloride   Intravenous Once  . amiodarone  200 mg Oral Daily  . atorvastatin  40 mg Oral Daily  . Chlorhexidine Gluconate Cloth  6 each Topical Daily  . diltiazem  60 mg Oral Q8H  . furosemide  40 mg Oral Daily  . insulin aspart  0-15 Units Subcutaneous Q4H  . polyethylene glycol  17 g Oral Daily  . vitamin B-12  1,000 mcg Oral Daily   Continuous Infusions: . sodium chloride 75 mL/hr at 01/18/20 0813  . sodium chloride 500 mL (01/14/20 1643)  . cefTRIAXone (ROCEPHIN)  IV 2 g (01/17/20 1716)  . vancomycin 1,500 mg (01/17/20 1233)        LOS: 8 days    Time spent: 25 minutes    Pennie BanterKelly A Carlie Solorzano, DO [facility]  01/18/2020, 8:15 AM    If 7PM-7AM, please contact night-coverage. How to contact the [facility] Attending or Consulting provider 7A - 7P or covering provider during after hours 7P -7A, for this patient?    1. Check the care team in [facility] and look for a) attending/consulting [facility] provider listed and b)  the [facility] team listed 2. Log into [email or web address] and use [facility] universal password to access. If you do not have the password, please contact the hospital operator. 3. Locate the [facility] provider you are looking for under [facility] and page to a number that you can be directly reached. 4. If you still have difficulty reaching the provider, please page the [facility] (Director on Call) for the Hospitalists listed on amion for assistance.

## 2020-01-18 NOTE — Plan of Care (Signed)
  Problem: Education: Goal: Knowledge of General Education information will improve Description Including pain rating scale, medication(s)/side effects and non-pharmacologic comfort measures Outcome: Progressing   Problem: Clinical Measurements: Goal: Will remain free from infection Outcome: Progressing   Problem: Elimination: Goal: Will not experience complications related to bowel motility Outcome: Progressing   Problem: Pain Managment: Goal: General experience of comfort will improve Outcome: Progressing   Problem: Safety: Goal: Ability to remain free from injury will improve Outcome: Progressing   

## 2020-01-18 NOTE — Progress Notes (Signed)
Physical Therapy Treatment Patient Details Name: Randall Vincent MRN: [id number] DOB: Oct 15, 1940 Today's Date: 01/18/2020    History of Present Illness 78 y.o. male here with left knee pain and swelling, ultimately septic and needing TKA hardwear removeal and AB spacer placed 8/26.  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  He has not had problems with the left knee until recently.    PT Comments    Pt was long sitting in bed upon arriving with knee immobilizer donned. He agrees to PT session and is cooperative and motivated however requires increased time 2/2 to pain/fatigue with movements. RR up/down throughout session but HR and O2 levels stable. He required mod assist to exit R side of bed with therapist supporting LLE in immobilizer. Sat EOB x ~ 20 minutes throughout session while performing transfers and recovery from transfers. Pt does fatigue quickly. Able to stand 4 x from elevated EOB while adhering to wt bearing restrictions. Recommend DC to SNF. Will continue to progress as able per POC.     Follow Up Recommendations  SNF;Supervision for mobility/OOB     Equipment Recommendations  None recommended by PT    Recommendations for Other Services       Precautions / Restrictions Precautions Precautions: Fall Precaution Comments: High Fall Restrictions Weight Bearing Restrictions: Yes LLE Weight Bearing: Touchdown weight bearing    Mobility  Bed Mobility Overal bed mobility: Needs Assistance Bed Mobility: Supine to Sit     Supine to sit: Mod assist     General bed mobility comments: assisted LLE to EOB/floor 2/2 to pain and weakness  Transfers Overall transfer level: Needs assistance Equipment used: Rolling walker (2 wheeled) Transfers: Sit to/from Stand Sit to Stand: From elevated surface;Min assist;Mod assist;+2 safety/equipment          General transfer comment: pt performed STS EOB 4 x. stood ~ 1-2 minutes each trial. able to adhere to precautions with encouragement   Ambulation/Gait             General Gait Details: unable/unsafe at this time   Stairs             Wheelchair Mobility    Modified Rankin (Stroke Patients Only)       Balance Overall balance assessment: Needs assistance Sitting-balance support: Feet supported Sitting balance-Leahy Scale: Good     Standing balance support: During functional activity;Bilateral upper extremity supported Standing balance-Leahy Scale: Fair Standing balance comment: highly reliant on the walker, unable to attain fully upright but given WBing status was relatively safe and stable                            Cognition Arousal/Alertness: Awake/alert Behavior During Therapy: WFL for tasks assessed/performed Overall Cognitive Status: Within Functional Limits for tasks assessed                                 General Comments: Pt is A and O x 4 and agreeable and pleasant throughotu      Exercises      General Comments        Pertinent Vitals/Pain Pain Assessment: 0-10 Pain Score: 6  Pain Location: L knee with movement Pain Descriptors / Indicators: Aching;Sore;Grimacing;Guarding Pain Intervention(s): Limited activity within patient's tolerance;Monitored during session;Premedicated before session;Repositioned;Ice  applied    Home Living                      Prior Function            PT Goals (current goals can now be found in the care plan section) Acute Rehab PT Goals Patient Stated Goal: To get moving like I was Progress towards PT goals: Progressing toward goals    Frequency    7X/week      PT Plan Current plan remains appropriate    Co-evaluation     PT goals addressed during session: Mobility/safety with mobility;Strengthening/ROM        AM-PAC PT "6 Clicks" Mobility   Outcome  Measure  Help needed turning from your back to your side while in a flat bed without using bedrails?: A Lot Help needed moving from lying on your back to sitting on the side of a flat bed without using bedrails?: A Lot Help needed moving to and from a bed to a chair (including a wheelchair)?: A Lot Help needed standing up from a chair using your arms (e.g., wheelchair or bedside chair)?: A Lot Help needed to walk in hospital room?: A Lot Help needed climbing 3-5 steps with a railing? : Total 6 Click Score: 11    End of Session Equipment Utilized During Treatment: Gait belt Activity Tolerance: Patient limited by pain;Patient tolerated treatment well;Patient limited by fatigue Patient left: in bed;with call bell/phone within reach;with bed alarm set;with family/visitor present Nurse Communication: Mobility status PT Visit Diagnosis: Muscle weakness (generalized) (M62.81);Other abnormalities of gait and mobility (R26.89);Difficulty in walking, not elsewhere classified (R26.2);Pain Pain - Right/Left: Left Pain - part of body: Knee     Time: 1340-1405 PT Time Calculation (min) (ACUTE ONLY): 25 min  Charges:  $Therapeutic Activity: 23-37 mins                     Jetta Lout PTA 01/18/20, 5:19 PM

## 2020-01-18 NOTE — Progress Notes (Signed)
Subjective:  POD #2 s/p removal of left septic knee prosthesis.   Patient reports left knee pain as marked with repositioning in bed by the nurses.  He is not having significant pain at rest.  Patient felt his knee immobilizer was too loose.  Patient's son is at the bedside.  Objective:   VITALS:   Vitals:   01/18/20 0315 01/18/20 0535 01/18/20 0733 01/18/20 1056  BP: (!) 165/54  (!) 151/57 136/70  Pulse: 89  82 82  Resp: (!) 21  17 17   Temp: 98.4 F (36.9 C) 98.7 F (37.1 C) 98.2 F (36.8 C) 98.1 F (36.7 C)  TempSrc: Oral Oral Oral Oral  SpO2: 94%  94% 93%  Weight: 128.3 kg     Height:        PHYSICAL EXAM: Left lower extremity: Patient's dressing is clean dry and intact.  He has a Polar Care in place.  Patient has palpable pedal pulses, intact/light touch and intact motor function distally.   LABS  Results for orders placed or performed during the hospital encounter of 01/10/20 (from the past 24 hour(s))  Glucose, capillary     Status: Abnormal   Collection Time: 01/17/20 12:18 PM  Result Value Ref Range   Glucose-Capillary 227 (H) 70 - 99 mg/dL  Glucose, capillary     Status: Abnormal   Collection Time: 01/17/20  4:59 PM  Result Value Ref Range   Glucose-Capillary 153 (H) 70 - 99 mg/dL  Hemoglobin and hematocrit, blood     Status: Abnormal   Collection Time: 01/17/20  6:16 PM  Result Value Ref Range   Hemoglobin 7.7 (L) 13.0 - 17.0 g/dL   HCT 01/19/20 (L) 39 - 52 %  Glucose, capillary     Status: Abnormal   Collection Time: 01/17/20  8:47 PM  Result Value Ref Range   Glucose-Capillary 183 (H) 70 - 99 mg/dL  Glucose, capillary     Status: Abnormal   Collection Time: 01/17/20 11:50 PM  Result Value Ref Range   Glucose-Capillary 172 (H) 70 - 99 mg/dL  Basic metabolic panel     Status: Abnormal   Collection Time: 01/18/20  1:56 AM  Result Value Ref Range   Sodium 135 135 - 145 mmol/L   Potassium 4.3 3.5 - 5.1 mmol/L   Chloride 104 98 - 111 mmol/L   CO2 24 22 -  32 mmol/L   Glucose, Bld 159 (H) 70 - 99 mg/dL   BUN 11 8 - 23 mg/dL   Creatinine, Ser 01/20/20 (H) 0.61 - 1.24 mg/dL   Calcium 7.5 (L) 8.9 - 10.3 mg/dL   GFR calc non Af Amer 47 (L) >60 mL/min   GFR calc Af Amer 55 (L) >60 mL/min   Anion gap 7 5 - 15  CBC     Status: Abnormal   Collection Time: 01/18/20  1:56 AM  Result Value Ref Range   WBC 13.0 (H) 4.0 - 10.5 K/uL   RBC 2.74 (L) 4.22 - 5.81 MIL/uL   Hemoglobin 7.4 (L) 13.0 - 17.0 g/dL   HCT 01/20/20 (L) 39 - 52 %   MCV 83.6 80.0 - 100.0 fL   MCH 27.0 26.0 - 34.0 pg   MCHC 32.3 30.0 - 36.0 g/dL   RDW 48.1 (H) 85.6 - 31.4 %   Platelets 334 150 - 400 K/uL   nRBC 0.0 0.0 - 0.2 %  Magnesium     Status: None   Collection Time: 01/18/20  1:56 AM  Result Value Ref Range   Magnesium 1.7 1.7 - 2.4 mg/dL  Glucose, capillary     Status: Abnormal   Collection Time: 01/18/20  5:00 AM  Result Value Ref Range   Glucose-Capillary 143 (H) 70 - 99 mg/dL  Glucose, capillary     Status: Abnormal   Collection Time: 01/18/20  7:33 AM  Result Value Ref Range   Glucose-Capillary 134 (H) 70 - 99 mg/dL  Hemoglobin and hematocrit, blood     Status: Abnormal   Collection Time: 01/18/20 10:22 AM  Result Value Ref Range   Hemoglobin 7.6 (L) 13.0 - 17.0 g/dL   HCT 93.7 (L) 39 - 52 %  Glucose, capillary     Status: Abnormal   Collection Time: 01/18/20 11:37 AM  Result Value Ref Range   Glucose-Capillary 137 (H) 70 - 99 mg/dL    No results found.  Assessment/Plan: 2 Days Post-Op   Principal Problem:   Melena Active Problems:   CAD, multiple vessel   S/P CABG x 2   Atrial fibrillation status post cardioversion 10/22/19 [facility])   Essential hypertension   DM (diabetes mellitus), type 2 (HCC)   Symptomatic anemia   Acute blood loss anemia   Suspect Septic arthritis (HCC)   Chronic anticoagulation   History of bilateral knee replacement   Chronic kidney disease, stage 3a   Acute on chronic congestive heart failure (HCC)   Elevated troponin    Intestinal bypass or anastomosis status   Diverticulosis of colon without hemorrhage  Patient is stable postop.  I have adjusted his brace and he states this feels better.  I informed the nursing aid at the bedside to ensure that the patient's leg is supported with movement both moving him up in the bed or getting into the bedside.  The leg has to be supported as his left knee prosthesis has been explanted.  Continue with physical therapy as tolerated.  Patient is nonweightbearing on the left lower extremity.  He may get up out of bed to a chair as tolerated.  Continue IV antibiotic treatment.    Juanell Fairly , MD 01/18/2020, 12:16 PM

## 2020-01-19 DIAGNOSIS — I5023 Acute on chronic systolic (congestive) heart failure: Secondary | ICD-10-CM

## 2020-01-19 LAB — CBC
HCT: 22.5 % — ABNORMAL LOW (ref 39.0–52.0)
Hemoglobin: 7.3 g/dL — ABNORMAL LOW (ref 13.0–17.0)
MCH: 26.8 pg (ref 26.0–34.0)
MCHC: 32.4 g/dL (ref 30.0–36.0)
MCV: 82.7 fL (ref 80.0–100.0)
Platelets: 336 10*3/uL (ref 150–400)
RBC: 2.72 MIL/uL — ABNORMAL LOW (ref 4.22–5.81)
RDW: 16.5 % — ABNORMAL HIGH (ref 11.5–15.5)
WBC: 12.4 10*3/uL — ABNORMAL HIGH (ref 4.0–10.5)
nRBC: 0 % (ref 0.0–0.2)

## 2020-01-19 LAB — HEMOGLOBIN AND HEMATOCRIT, BLOOD
HCT: 22.1 % — ABNORMAL LOW (ref 39.0–52.0)
HCT: 23.5 % — ABNORMAL LOW (ref 39.0–52.0)
HCT: 24.7 % — ABNORMAL LOW (ref 39.0–52.0)
Hemoglobin: 7.2 g/dL — ABNORMAL LOW (ref 13.0–17.0)
Hemoglobin: 7.4 g/dL — ABNORMAL LOW (ref 13.0–17.0)
Hemoglobin: 7.8 g/dL — ABNORMAL LOW (ref 13.0–17.0)

## 2020-01-19 LAB — BASIC METABOLIC PANEL
Anion gap: 9 (ref 5–15)
BUN: 12 mg/dL (ref 8–23)
CO2: 22 mmol/L (ref 22–32)
Calcium: 7.5 mg/dL — ABNORMAL LOW (ref 8.9–10.3)
Chloride: 103 mmol/L (ref 98–111)
Creatinine, Ser: 1.26 mg/dL — ABNORMAL HIGH (ref 0.61–1.24)
GFR calc Af Amer: >60 mL/min (ref >60)
GFR calc non Af Amer: 54 mL/min — ABNORMAL LOW (ref >60)
Glucose, Bld: 173 mg/dL — ABNORMAL HIGH (ref 70–99)
Potassium: 3.3 mmol/L — ABNORMAL LOW (ref 3.5–5.1)
Sodium: 134 mmol/L — ABNORMAL LOW (ref 135–145)

## 2020-01-19 LAB — GLUCOSE, CAPILLARY
Glucose-Capillary: 144 mg/dL — ABNORMAL HIGH (ref 70–99)
Glucose-Capillary: 145 mg/dL — ABNORMAL HIGH (ref 70–99)
Glucose-Capillary: 151 mg/dL — ABNORMAL HIGH (ref 70–99)
Glucose-Capillary: 154 mg/dL — ABNORMAL HIGH (ref 70–99)
Glucose-Capillary: 155 mg/dL — ABNORMAL HIGH (ref 70–99)
Glucose-Capillary: 165 mg/dL — ABNORMAL HIGH (ref 70–99)
Glucose-Capillary: 192 mg/dL — ABNORMAL HIGH (ref 70–99)

## 2020-01-19 LAB — MAGNESIUM: Magnesium: 1.8 mg/dL (ref 1.7–2.4)

## 2020-01-19 MED ORDER — POTASSIUM CHLORIDE CRYS ER 20 MEQ PO TBCR
40.0000 meq | EXTENDED_RELEASE_TABLET | Freq: Once | ORAL | Status: AC
  Administered 2020-01-19: 40 meq via ORAL
  Filled 2020-01-19: qty 2

## 2020-01-19 MED ORDER — SPIRONOLACTONE 25 MG PO TABS: 25.0000 mg | ORAL_TABLET | Freq: Every day | ORAL | Status: DC

## 2020-01-19 MED ORDER — SPIRONOLACTONE 25 MG PO TABS
25.0000 mg | ORAL_TABLET | Freq: Every day | ORAL | Status: DC
  Administered 2020-01-20 – 2020-01-22 (×3): 25 mg via ORAL
  Filled 2020-01-19 (×3): qty 1

## 2020-01-19 MED ORDER — RAMIPRIL 10 MG PO CAPS: 20.0000 mg | ORAL_CAPSULE | Freq: Every day | ORAL | Status: DC

## 2020-01-19 MED ORDER — RAMIPRIL 10 MG PO CAPS
20.0000 mg | ORAL_CAPSULE | Freq: Every day | ORAL | Status: DC
  Administered 2020-01-20 – 2020-01-22 (×3): 20 mg via ORAL
  Filled 2020-01-19 (×3): qty 2

## 2020-01-19 NOTE — Progress Notes (Signed)
Physical Therapy Treatment Patient Details Name: Randall Vincent MRN: [id number] DOB: February 03, 1941 Today's Date: 01/19/2020    History of Present Illness 79 y.o. male here with left knee pain and swelling, ultimately septic and needing TKA hardwear removeal and AB spacer placed 8/26.  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  He has not had problems with the left knee until recently.    PT Comments    Pt ready for session.  Ankle pumps, quad sets, SLR and ab/add x 10 with assist as needed.  To EOB with mod a x 1 mostly for LE management and to raise trunk up off bed.  Once sitting he is steady with support of rail and elevated HOB.  He sits x 35 minutes in general comfort.  Standing is deferred today.  Returned to supine with max a x 1 for LE assist.     Follow Up Recommendations  SNF;Supervision for mobility/OOB     Equipment Recommendations  None recommended by PT    Recommendations for Other Services       Precautions / Restrictions Precautions Precautions: Fall Precaution Comments: High Fall Restrictions Weight Bearing Restrictions: Yes LLE Weight Bearing: Non weight bearing    Mobility  Bed Mobility Overal bed mobility: Needs Assistance Bed Mobility: Supine to Sit;Sit to Supine     Supine to sit: Mod assist Sit to supine: Max assist      Transfers                    Ambulation/Gait                 Stairs             Wheelchair Mobility    Modified Rankin (Stroke Patients Only)       Balance Overall balance assessment: Needs assistance Sitting-balance support: Feet supported Sitting balance-Leahy Scale: Good                                      Cognition Arousal/Alertness: Awake/alert Behavior During Therapy: WFL for tasks assessed/performed Overall Cognitive Status: Within Functional Limits  for tasks assessed                                 General Comments: Pt is A and O x 4 and agreeable and pleasant throughout      Exercises      General Comments        Pertinent Vitals/Pain Pain Assessment: Faces Faces Pain Scale: Hurts little more Pain Location: L knee with movement Pain Descriptors / Indicators: Aching;Sore;Grimacing;Guarding Pain Intervention(s): RN gave pain meds during session;Limited activity within patient's tolerance;Monitored during session;Ice applied    Home Living                      Prior Function            PT Goals (current goals can now be found in the care plan section) Progress towards PT goals: Progressing toward goals    Frequency    7X/week      PT Plan Current plan remains appropriate    Co-evaluation              AM-PAC PT "  6 Clicks" Mobility   Outcome Measure  Help needed turning from your back to your side while in a flat bed without using bedrails?: A Lot Help needed moving from lying on your back to sitting on the side of a flat bed without using bedrails?: A Lot Help needed moving to and from a bed to a chair (including a wheelchair)?: A Lot Help needed standing up from a chair using your arms (e.g., wheelchair or bedside chair)?: A Lot Help needed to walk in hospital room?: A Lot Help needed climbing 3-5 steps with a railing? : Total 6 Click Score: 11    End of Session Equipment Utilized During Treatment: Gait belt Activity Tolerance: Patient limited by pain;Patient tolerated treatment well;Patient limited by fatigue Patient left: in bed;with call bell/phone within reach;with bed alarm set;with family/visitor present Nurse Communication: Mobility status PT Visit Diagnosis: Muscle weakness (generalized) (M62.81);Other abnormalities of gait and mobility (R26.89);Difficulty in walking, not elsewhere classified (R26.2);Pain Pain - Right/Left: Left Pain - part of body: Knee     Time:  2979-8921 PT Time Calculation (min) (ACUTE ONLY): 53 min  Charges:  $Therapeutic Exercise: 8-22 mins $Therapeutic Activity: 38-52 mins                    Danielle Dess, PTA 01/19/20, 1:13 PM

## 2020-01-19 NOTE — Progress Notes (Signed)
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   Patient reports left knee pain as mild to moderate.  Patient states his left knee pain has improved compared to yesterday.  Objective:   VITALS:   Vitals:   01/18/20 1626 01/18/20 1957 01/19/20 0445 01/19/20 0746  BP: (!) 162/57 (!) 161/60 (!) 148/59 (!) 144/54  Pulse: 92 90 79 71  Resp: 17 (!) 24 (!) 23 19  Temp: 98.8 F (37.1 C) 98.6 F (37 C) 98.6 F (37 C) 97.9 F (36.6 C)  TempSrc: Oral Oral Oral Oral  SpO2: 95% 95% 95% 93%  Weight:   128 kg   Height:        PHYSICAL EXAM: Left lower extremity: Knee immobilizer and Polar Care in place.  Dressing clean dry and intact. Neurovascular intact Sensation intact distally Intact pulses distally Dorsiflexion/Plantar flexion intact Compartment soft  LABS  Results for orders placed or performed during the hospital encounter of 01/10/20 (from the past 24 hour(s))  Glucose, capillary     Status: Abnormal   Collection Time: 01/18/20 11:37 AM  Result Value Ref Range   Glucose-Capillary 137 (H) 70 - 99 mg/dL  Glucose, capillary     Status: Abnormal   Collection Time: 01/18/20  4:26 PM  Result Value Ref Range   Glucose-Capillary 187 (H) 70 - 99 mg/dL  Hemoglobin and hematocrit, blood     Status: Abnormal   Collection Time: 01/18/20  6:17 PM  Result Value Ref Range   Hemoglobin 7.9 (L) 13.0 - 17.0 g/dL   HCT 84.5 (L) 39 - 52 %  Glucose, capillary     Status: Abnormal   Collection Time: 01/18/20  8:28 PM  Result Value Ref Range   Glucose-Capillary 191 (H) 70 - 99 mg/dL  Glucose, capillary     Status: Abnormal   Collection Time: 01/19/20 12:20 AM  Result Value Ref Range   Glucose-Capillary 144 (H) 70 - 99 mg/dL  Hemoglobin and hematocrit, blood     Status: Abnormal   Collection Time: 01/19/20  2:03 AM  Result Value Ref Range   Hemoglobin 7.2 (L) 13.0 - 17.0 g/dL   HCT 36.4 (L) 39 - 52 %  Basic metabolic panel     Status:  Abnormal   Collection Time: 01/19/20  2:03 AM  Result Value Ref Range   Sodium 134 (L) 135 - 145 mmol/L   Potassium 3.3 (L) 3.5 - 5.1 mmol/L   Chloride 103 98 - 111 mmol/L   CO2 22 22 - 32 mmol/L   Glucose, Bld 173 (H) 70 - 99 mg/dL   BUN 12 8 - 23 mg/dL   Creatinine, Ser 6.80 (H) 0.61 - 1.24 mg/dL   Calcium 7.5 (L) 8.9 - 10.3 mg/dL   GFR calc non Af Amer 54 (L) >60 mL/min   GFR calc Af Amer >60 >60 mL/min   Anion gap 9 5 - 15  CBC     Status: Abnormal   Collection Time: 01/19/20  2:03 AM  Result Value Ref Range   WBC 12.4 (H) 4.0 - 10.5 K/uL   RBC 2.72 (L) 4.22 - 5.81 MIL/uL   Hemoglobin 7.3 (L) 13.0 - 17.0 g/dL   HCT 32.1 (L) 39 - 52 %   MCV 82.7 80.0 - 100.0 fL   MCH 26.8 26.0 - 34.0 pg   MCHC 32.4 30.0 - 36.0 g/dL   RDW 22.4 (H) 82.5 - 00.3 %   Platelets 336 150 -  400 K/uL   nRBC 0.0 0.0 - 0.2 %  Magnesium     Status: None   Collection Time: 01/19/20  2:03 AM  Result Value Ref Range   Magnesium 1.8 1.7 - 2.4 mg/dL  Glucose, capillary     Status: Abnormal   Collection Time: 01/19/20  4:43 AM  Result Value Ref Range   Glucose-Capillary 151 (H) 70 - 99 mg/dL  Glucose, capillary     Status: Abnormal   Collection Time: 01/19/20  7:44 AM  Result Value Ref Range   Glucose-Capillary 155 (H) 70 - 99 mg/dL  Hemoglobin and hematocrit, blood     Status: Abnormal   Collection Time: 01/19/20 10:07 AM  Result Value Ref Range   Hemoglobin 7.4 (L) 13.0 - 17.0 g/dL   HCT 69.6 (L) 39 - 52 %    No results found.  Assessment/Plan: 3 Days Post-Op   Principal Problem:   Melena Active Problems:   CAD, multiple vessel   S/P CABG x 2   Atrial fibrillation status post cardioversion 10/22/19 [facility])   Essential hypertension   DM (diabetes mellitus), type 2 (HCC)   Symptomatic anemia   Acute blood loss anemia   Suspect Septic arthritis (HCC)   Chronic anticoagulation   History of bilateral knee replacement   Chronic kidney disease, stage 3a   Acute on chronic congestive heart  failure (HCC)   Elevated troponin   Intestinal bypass or anastomosis status   Diverticulosis of colon without hemorrhage  Continue IV antibiotics.  Cultures pending.  Patient is nonweightbearing on the left lower extremity.  He will continue with physical therapy.  Continue strict blood sugar control.    Juanell Fairly , MD 01/19/2020, 11:32 AM

## 2020-01-19 NOTE — Plan of Care (Signed)
Discussed pain management with patient and pharmacologic interventions to reduce pain. Patient received pain medication for surgical knee pain and reported reduction in pain level.

## 2020-01-19 NOTE — Progress Notes (Signed)
PROGRESS NOTE    Alasdair Kleve   HYQ:[id number]  DOB: 12/21/1940  PCP: Elspeth Cho., MD    DOA: 01/10/2020 LOS: 9   Brief Narrative   Dominique Calvey is a 79 y.o. male who complains of left knee pain and swelling for the last few days.  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  He has never had problems with the left knee until recently.  He was seen in our [facility] at [facility] earlier this week complaining of pain and swelling.  He was scheduled for a bone scan to evaluate him for prosthetic loosening versus infection by Dr. Odis Luster.  However he developed GI bleeding in the last day or so and has presented to the emergency room for this reason primarily.  Hemoglobin is down to 7.2 from a high of 13.5 earlier this year.  White blood count is 18,100.  Temperature is about 100 F.  The left knee pain and swelling was noted as well as well as a slightly elevated white count and mild fever.  He had cardioversion in June and July and has remained in sinus rhythm he says.  He was switched from Eliquis to Pradaxa at that time and took his last Pradaxa dose yesterday.       Assessment & Plan   Principal Problem:   Melena Active Problems:   CAD, multiple vessel   S/P CABG x 2   Atrial fibrillation status post cardioversion 10/22/19 [facility])   Essential hypertension   DM (diabetes mellitus), type 2 (HCC)   Symptomatic anemia   Acute blood loss anemia   Suspect Septic arthritis (HCC)   Chronic anticoagulation   History of bilateral knee replacement   Chronic kidney disease, stage 3a   Acute on chronic congestive heart failure (HCC)   Elevated troponin   Intestinal bypass or anastomosis status   Diverticulosis of colon without hemorrhage   Acute blood loss anemiasecondary GI bleeding - presented with melena in setting of anticoagulation.   EGD on 8/23  showed possible Barrett's esophagus changes (biopsy contraindicated with bleeding), erosive gastropathy but no signs of recent of active bleeding.   Colonoscopy on 8/25 showed diverticulosis at the colonic anastamosis, patent surgical anastamosis, non-bleeding internal hemorrhoids.   Hbg trend: lowest was 6.4 on 8/21.   Recent trend: Hbg 8.3 >> 8.1 >> 7.7 >> 7.4 >> 7.6 >> 7.3 >> 7.4 --GI following - follow up in clinic in 2-4 weeks -Hold pradaxa and all blood thinners for now --d/c IV Protonix --not on PPI at home, so will not start at this time, consider in outpatient setting if indicated -Serial H&H's q8h --Transfuse if Hbg < 7.0 or active bleeding -Monitor closely. Has been hemodynamically stable.   --Page GI if active bleeding or unstable.   Septic arthritis of left knee / History of bilateral knee replacement - POA.  History of knee replacement several years ago presenting with a several week history of worsening pain and swelling of the left knee.   --Ortho is consulted --Had surgery 8/26, removed L knee prosthesis and placed antibiotic spacer --ID is following --On Vancomycin and Rocephin empirically pending cx's --Follow up operative tissue cultures (reincubated for better growth)   Mild acute on chronic systolic CHF - Resolved.  Presented with mildshortness of breathabove baseline, pulmonary vascular congestion on chest x-ray, BNP elevated at 400, all  consistent with decompensated CHF (but SOB could be due to anemia as well).  No prior echo's at time of admission. Echo on 8/21 showed EF 50-55%, diastolic parameters not evaluated.   -Needs beta blocker if BP allows --Resume ramipril and Aldactone --Will add low dose beta blocker if BP will tolerate, resume above and monitor before starting -Stop PO Lasix - edema has resolved.  Not on Lasix at home.   --Monitor for recurrent edema --Monitor BP, electrolytes and renal function closely   Elevated troponin - POA, secondary  to demand ischemia in setting of anemia and renal failure reducing clearance.   --Monitor for chest pain and get stat EKG and troponin as needed.  CAD, multiple vessel, S/P CABG x 2 - stable.   -Holding aspirin and metoprolol for now  --Continue statin, aspirin held, continue diltiazem as BP tolerates --Hold Aldactone due to renal failure  Atrial fibrillation status post cardioversion 10/22/19.  Currently in sinus rhythm.  Recently switched from Pradaxa to Eliquis on 12/18/2019 due to concern for renal function.  Pradaxa discontinued for now due to GI bleeding.  Essential hypertension - chronic.  Continued on home Diltiazem for A-fib.  Resume ramipril and Aldactione which were on hold due to AKI and low BP which has resolved.  Monitor BP closely.  Type 2 Diabetes - well controlled, A1C is 6.7%.   -CBG's every 4 while n.p.o. with sliding scale coverage. --Home regimen is weekly Ozempic only (takes injection on Thursdays).  --Metformin previously discontinued due to recent renal insufficiency.  Acute kidney injury - POA, resolved.  Likely pre-renal in setting of bleeding and hypotension.  Avoid nephrotoxins and hypotension.  Renally dose meds as indicated.   GFR at baseline.  Obesity: Body mass index is 42.91 kg/m.  Complicates overall care and prognosis.  DVT prophylaxis: SCDs Start: 01/18/20 1319 SCDs Start: 01/10/20 2307   Diet:  Diet Orders (From admission, onward)    Start     Ordered   01/16/20 1620  Diet regular Room service appropriate? Yes; Fluid consistency: Thin  Diet effective now       Question Answer Comment  Room service appropriate? Yes   Fluid consistency: Thin      01/16/20 1619            Code Status: Full Code    Subjective 01/19/20    Patient seen at bedside this morning.  He is feeling better today, less pain after brace was tightened up yesterday.  Tolerating bed mobility a little better today.  Has not had a BM but feels gassy.  Denies fever/chills,  bleeding, chest pain, SOB or other complaints.     Disposition Plan & Communication   Status is: Inpatient  Remains inpatient appropriate because:Ongoing diagnostic testing needed not appropriate for outpatient work up.  Awaiting operative cultures for septic knee.  Monitoring anemia closely.    Dispo: The patient is from: Home              Anticipated d/c is to: Home              Anticipated d/c date is: 2 days              Patient currently is not medically stable to d/c.    Family Communication: none at bedside during encounter, son updated on rounds at bedside 8.27   Consults, Procedures, Significant Events   Consultants:   Orthopedics  Infectious Disease  GI  Procedures:   EGD  Colonoscopy  Objective   Vitals:   01/18/20 1626 01/18/20 1957 01/19/20 0445 01/19/20 0746  BP: (!) 162/57 (!) 161/60 (!) 148/59 (!) 144/54  Pulse: 92 90 79 71  Resp: 17 (!) 24 (!) 23 19  Temp: 98.8 F (37.1 C) 98.6 F (37 C) 98.6 F (37 C) 97.9 F (36.6 C)  TempSrc: Oral Oral Oral Oral  SpO2: 95% 95% 95% 93%  Weight:   128 kg   Height:        Intake/Output Summary (Last 24 hours) at 01/19/2020 0834 Last data filed at 01/19/2020 0200 Gross per 24 hour  Intake 991.86 ml  Output 2800 ml  Net -1808.14 ml   Filed Weights   01/17/20 0358 01/18/20 0315 01/19/20 0445  Weight: 127.9 kg 128.3 kg 128 kg    Physical Exam:  General exam: awake, alert, no acute distress, obese Respiratory system: CTAB, no wheezes, rales or rhonchi, normal respiratory effort. Cardiovascular system: normal S1/S2, RRR, improved lower extremity edema still trace to 1+ Gastrointestinal system: soft, NT, ND, positive bowel sounds Central nervous system: A&O x4. no gross focal neurologic deficits, normal speech Extremities: B/L upper extremity edema resolved, LLE in brace with good distal pulse and sensation, RLE no edema  Psychiatry: normal mood, congruent affect, judgement and insight appear  normal  Labs   Data Reviewed: I have personally reviewed following labs and imaging studies  CBC: Recent Labs  Lab 01/15/20 0419 01/15/20 1828 01/16/20 0239 01/16/20 0956 01/17/20 0108 01/17/20 1010 01/17/20 1816 01/18/20 0156 01/18/20 1022 01/18/20 1817 01/19/20 0203  WBC 11.4*  --  10.3  --  13.4*  --   --  13.0*  --   --  12.4*  HGB 7.9*   < > 7.6*   < > 8.3*   < > 7.7* 7.4* 7.6* 7.9* 7.3*  7.2*  HCT 25.2*   < > 24.2*   < > 24.9*   < > 24.6* 22.9* 22.9* 24.4* 22.5*  22.1*  MCV 86.0  --  85.8  --  83.6  --   --  83.6  --   --  82.7  PLT 344  --  320  --  314  --   --  334  --   --  336   < > = values in this interval not displayed.   Basic Metabolic Panel: Recent Labs  Lab 01/13/20 0444 01/14/20 0524 01/16/20 0239 01/17/20 0108 01/18/20 0156 01/18/20 1022 01/19/20 0203  NA 133*   < > 137 136 135 135 134*  K 4.7   < > 3.9 4.2 4.3 3.6 3.3*  CL 107   < > 107 105 104 105 103  CO2 17*   < > 20* 22  GLUCOSE 128*   < > 127* 242* 159* 165* 173*  BUN 25*   < > CREATININE 1.47*   < > 1.21 1.16 1.41* 1.26* 1.26*  CALCIUM 8.0*   < > 7.5* 7.8* 7.5* 7.8* 7.5*  MG 2.0  --  1.7 2.0 1.7  --  1.8   < > = values in this interval not displayed.   GFR: Estimated Creatinine Clearance: 63 mL/min (A) (by C-G formula based on SCr of 1.26 mg/dL (H)). Liver Function Tests: No results for input(s): AST, ALT, ALKPHOS, BILITOT, PROT, ALBUMIN in the last 168 hours. No results for input(s): LIPASE, AMYLASE in the last 168 hours. No results for input(s): AMMONIA in the last 168 hours.  Coagulation Profile: No results for input(s): INR, PROTIME in the last 168 hours. Cardiac Enzymes: No results for input(s): CKTOTAL, CKMB, CKMBINDEX, TROPONINI in the last 168 hours. BNP (last 3 results) No results for input(s): PROBNP in the last 8760 hours. HbA1C: No results for input(s): HGBA1C in the last 72 hours. CBG: Recent Labs  Lab 01/18/20 1626 01/18/20 2028  01/19/20 0020 01/19/20 0443 01/19/20 0744  GLUCAP 187* 191* 144* 151* 155*   Lipid Profile: No results for input(s): CHOL, HDL, LDLCALC, TRIG, CHOLHDL, LDLDIRECT in the last 72 hours. Thyroid Function Tests: No results for input(s): TSH, T4TOTAL, FREET4, T3FREE, THYROIDAB in the last 72 hours. Anemia Panel: No results for input(s): VITAMINB12, FOLATE, FERRITIN, TIBC, IRON, RETICCTPCT in the last 72 hours. Sepsis Labs: No results for input(s): PROCALCITON, LATICACIDVEN in the last 168 hours.  Recent Results (from the past 240 hour(s))  Culture, blood (routine x 2)     Status: None   Collection Time: 01/11/20 12:37 AM   Specimen: BLOOD  Result Value Ref Range Status   Specimen Description BLOOD RIGHT ANTECUBITAL  Final   Special Requests   Final    BOTTLES DRAWN AEROBIC AND ANAEROBIC Blood Culture results may not be optimal due to an excessive volume of blood received in culture bottles   Culture   Final    NO GROWTH 5 DAYS Performed at [facility], [address] [place], [place] [address]    Report Status 01/16/2020 FINAL  Final  Culture, blood (routine x 2)     Status: None   Collection Time: 01/11/20 12:37 AM   Specimen: BLOOD  Result Value Ref Range Status   Specimen Description BLOOD LEFT ANTECUBITAL  Final   Special Requests   Final    BOTTLES DRAWN AEROBIC AND ANAEROBIC Blood Culture results may not be optimal due to an excessive volume of blood received in culture bottles   Culture   Final    NO GROWTH 5 DAYS Performed at [facility], [address], [place], [place] [address]    Report Status 01/16/2020 FINAL  Final  SARS Coronavirus 2 by RT PCR (hospital order, performed in [facility] hospital lab) Nasopharyngeal Nasopharyngeal Swab     Status: None   Collection Time: 01/11/20  6:14 AM   Specimen: Nasopharyngeal Swab  Result Value Ref Range Status   SARS Coronavirus 2 NEGATIVE NEGATIVE Final    Comment: (NOTE) SARS-CoV-2 target nucleic  acids are NOT DETECTED.  The SARS-CoV-2 RNA is generally detectable in upper and lower respiratory specimens during the acute phase of infection. The lowest concentration of SARS-CoV-2 viral copies this assay can detect is 250 copies / mL. A negative result does not preclude SARS-CoV-2 infection and should not be used as the sole basis for treatment or other patient management decisions.  A negative result may occur with improper specimen collection / handling, submission of specimen other than nasopharyngeal swab, presence of viral mutation(s) within the areas targeted by this assay, and inadequate number of viral copies (<250 copies / mL). A negative result must be combined with clinical observations, patient history, and epidemiological information.  Fact Sheet for Patients:   BoilerBrush.com.cy  Fact Sheet for Healthcare Providers: https://pope.com/  This test is not yet approved or  cleared by the [place] FDA and has been authorized for detection and/or diagnosis of SARS-CoV-2 by FDA under an Emergency Use Authorization (EUA).  This EUA will remain in effect (meaning this test can be used) for the  duration of the COVID-19 declaration under Section 564(b)(1) of the Act, 21 U.S.C. section 360bbb-3(b)(1), unless the authorization is terminated or revoked sooner.  Performed at [facility], [address] [place], [place] [address]   Culture, body fluid-bottle     Status: Abnormal   Collection Time: 01/11/20  6:07 PM   Specimen: Fluid  Result Value Ref Range Status   Specimen Description FLUID KNEE  Final   Special Requests   Final    BOTTLES DRAWN AEROBIC AND ANAEROBIC Blood Culture adequate volume   Gram Stain   Final    GRAM POSITIVE COCCI IN CLUSTERS ANAEROBIC BOTTLE ONLY CRITICAL RESULT CALLED TO, READ BACK BY AND VERIFIED WITH: RN Sheria LangAMERON [id number] FCP NO ORGANISMS SEEN AEROBIC BOTTLE  ONLY Performed at [facility], [address] [place], [place] [address]    Culture ENTEROCOCCUS FAECALIS (A)  Final   Report Status 01/15/2020 FINAL  Final   Organism ID, Bacteria ENTEROCOCCUS FAECALIS  Final      Susceptibility   Enterococcus faecalis - MIC*    AMPICILLIN <=2 SENSITIVE Sensitive     VANCOMYCIN 1 SENSITIVE Sensitive     GENTAMICIN SYNERGY SENSITIVE Sensitive     * ENTEROCOCCUS FAECALIS  Gram stain     Status: None   Collection Time: 01/11/20  6:07 PM   Specimen: Fluid  Result Value Ref Range Status   Specimen Description FLUID KNEE  Final   Special Requests NONE  Final   Gram Stain   Final    FEW WBC PRESENT,BOTH PMN AND MONONUCLEAR NO ORGANISMS SEEN Performed at [facility], [address] [place], [place] [address]    Report Status 01/12/2020 FINAL  Final  Aerobic/Anaerobic Culture (surgical/deep wound)     Status: None (Preliminary result)   Collection Time: 01/16/20 12:41 PM   Specimen: PATH Cytology Misc. fluid; Body Fluid  Result Value Ref Range Status   Specimen Description TISSUE  Final   Special Requests LEFT KNEE TISSUE  Final   Gram Stain   Final    FEW WBC PRESENT,BOTH PMN AND MONONUCLEAR NO ORGANISMS SEEN    Culture   Final    CULTURE REINCUBATED FOR BETTER GROWTH Performed at [facility], [address] [place], [place] [address]    Report Status PENDING  Incomplete      Imaging Studies   No results found.   Medications   Scheduled Meds: . sodium chloride   Intravenous Once  . acetaminophen  500 mg Oral Q6H  . amiodarone  200 mg Oral Daily  . atorvastatin  40 mg Oral Daily  . Chlorhexidine Gluconate Cloth  6 each Topical Daily  . diltiazem  60 mg Oral Q8H  . docusate sodium  100 mg Oral BID  . furosemide  40 mg Oral Daily  . insulin aspart  0-15 Units Subcutaneous Q4H  . ketorolac  15 mg Intravenous Q6H  . polyethylene glycol  17 g Oral Daily  . vitamin B-12  1,000 mcg Oral Daily   Continuous  Infusions: . sodium chloride 75 mL/hr at 01/19/20 0020  . sodium chloride 500 mL (01/14/20 1643)  . cefTRIAXone (ROCEPHIN)  IV 2 g (01/18/20 1641)  . vancomycin 1,500 mg (01/18/20 1209)       LOS: 9 days    Time spent: 25 minutes    Pennie BanterKelly A Sandia Pfund, DO [facility]  01/19/2020, 8:34 AM    If 7PM-7AM, please contact night-coverage. How to contact  the [facility] Attending or Consulting provider [place] or covering provider during after hours [place], for this patient?    1. Check the care team in [facility] and look for a) attending/consulting TRH provider listed and b) the [facility] team listed 2. Log into [email or web address] and use [place]'s universal password to access. If you do not have the password, please contact the hospital operator. 3. Locate the [facility] provider you are looking for under [facility] and page to a number that you can be directly reached. 4. If you still have difficulty reaching the provider, please page the [facility] (Director on Call) for the Hospitalists listed on amion for assistance.

## 2020-01-20 ENCOUNTER — Inpatient Hospital Stay: Payer: Self-pay

## 2020-01-20 LAB — BASIC METABOLIC PANEL
Anion gap: 5 (ref 5–15)
BUN: 16 mg/dL (ref 8–23)
CO2: 24 mmol/L (ref 22–32)
Calcium: 7.5 mg/dL — ABNORMAL LOW (ref 8.9–10.3)
Chloride: 108 mmol/L (ref 98–111)
Creatinine, Ser: 1.41 mg/dL — ABNORMAL HIGH (ref 0.61–1.24)
GFR calc Af Amer: 55 mL/min — ABNORMAL LOW (ref >60)
GFR calc non Af Amer: 47 mL/min — ABNORMAL LOW (ref >60)
Glucose, Bld: 167 mg/dL — ABNORMAL HIGH (ref 70–99)
Potassium: 4.1 mmol/L (ref 3.5–5.1)
Sodium: 137 mmol/L (ref 135–145)

## 2020-01-20 LAB — GLUCOSE, CAPILLARY
Glucose-Capillary: 130 mg/dL — ABNORMAL HIGH (ref 70–99)
Glucose-Capillary: 132 mg/dL — ABNORMAL HIGH (ref 70–99)
Glucose-Capillary: 142 mg/dL — ABNORMAL HIGH (ref 70–99)
Glucose-Capillary: 156 mg/dL — ABNORMAL HIGH (ref 70–99)
Glucose-Capillary: 159 mg/dL — ABNORMAL HIGH (ref 70–99)
Glucose-Capillary: 179 mg/dL — ABNORMAL HIGH (ref 70–99)

## 2020-01-20 LAB — MAGNESIUM: Magnesium: 1.8 mg/dL (ref 1.7–2.4)

## 2020-01-20 LAB — CBC
HCT: 23.3 % — ABNORMAL LOW (ref 39.0–52.0)
Hemoglobin: 7.3 g/dL — ABNORMAL LOW (ref 13.0–17.0)
MCH: 27.1 pg (ref 26.0–34.0)
MCHC: 31.3 g/dL (ref 30.0–36.0)
MCV: 86.6 fL (ref 80.0–100.0)
Platelets: 378 10*3/uL (ref 150–400)
RBC: 2.69 MIL/uL — ABNORMAL LOW (ref 4.22–5.81)
RDW: 16.7 % — ABNORMAL HIGH (ref 11.5–15.5)
WBC: 12 10*3/uL — ABNORMAL HIGH (ref 4.0–10.5)
nRBC: 0 % (ref 0.0–0.2)

## 2020-01-20 LAB — VANCOMYCIN, TROUGH: Vancomycin Tr: 15 ug/mL (ref 15–20)

## 2020-01-20 LAB — HEMOGLOBIN AND HEMATOCRIT, BLOOD
HCT: 23.7 % — ABNORMAL LOW (ref 39.0–52.0)
HCT: 23.1 % — ABNORMAL LOW (ref 39.0–52.0)
Hemoglobin: 7.4 g/dL — ABNORMAL LOW (ref 13.0–17.0)
Hemoglobin: 7.5 g/dL — ABNORMAL LOW (ref 13.0–17.0)

## 2020-01-20 MED ORDER — MAGNESIUM SULFATE 2 GM/50ML IV SOLN
2.0000 g | Freq: Once | INTRAVENOUS | Status: AC
  Administered 2020-01-20: 2 g via INTRAVENOUS
  Filled 2020-01-20: qty 50

## 2020-01-20 MED ORDER — PENICILLIN G POTASSIUM 20000000 UNITS IJ SOLR
12.0000 10*6.[IU] | Freq: Two times a day (BID) | INTRAVENOUS | Status: DC
  Administered 2020-01-20 – 2020-01-21 (×3): 12 10*6.[IU] via INTRAVENOUS
  Filled 2020-01-20 (×5): qty 12

## 2020-01-20 NOTE — Progress Notes (Signed)
PHARMACY CONSULT NOTE FOR:  OUTPATIENT  PARENTERAL ANTIBIOTIC THERAPY (OPAT)  Indication: E. Faecalis PJI of knee Regimen: Penicillin G 24 million units as continuous infusion over 24h End date: 02/22/2020  IV antibiotic discharge orders are pended. To discharging provider:  please sign these orders via discharge navigator,  Select New Orders & click on the button choice - Manage This Unsigned Work.     Thank you for allowing pharmacy to be a part of this patient's care.  Juliette Alcide, PharmD, BCPS.   Work Cell: [phone] 01/20/2020 5:28 PM

## 2020-01-20 NOTE — Progress Notes (Addendum)
Infectious Disease Long Term IV Antibiotic Orders Randall Vincent Jul 21, 1940  Diagnosis: PJI  Culture results Results for orders placed or performed during the hospital encounter of 01/10/20  Culture, blood (routine x 2)     Status: None   Collection Time: 01/11/20 12:37 AM   Specimen: BLOOD  Result Value Ref Range Status   Specimen Description BLOOD RIGHT ANTECUBITAL  Final   Special Requests   Final    BOTTLES DRAWN AEROBIC AND ANAEROBIC Blood Culture results may not be optimal due to an excessive volume of blood received in culture bottles   Culture   Final    NO GROWTH 5 DAYS Performed at [facility], [address] [place], [place] [address]    Report Status 01/16/2020 FINAL  Final  Culture, blood (routine x 2)     Status: None   Collection Time: 01/11/20 12:37 AM   Specimen: BLOOD  Result Value Ref Range Status   Specimen Description BLOOD LEFT ANTECUBITAL  Final   Special Requests   Final    BOTTLES DRAWN AEROBIC AND ANAEROBIC Blood Culture results may not be optimal due to an excessive volume of blood received in culture bottles   Culture   Final    NO GROWTH 5 DAYS Performed at [facility], [address], [place], [place] [address]    Report Status 01/16/2020 FINAL  Final  SARS Coronavirus 2 by RT PCR (hospital order, performed in [place] hospital lab) Nasopharyngeal Nasopharyngeal Swab     Status: None   Collection Time: 01/11/20  6:14 AM   Specimen: Nasopharyngeal Swab  Result Value Ref Range Status   SARS Coronavirus 2 NEGATIVE NEGATIVE Final    Comment: (NOTE) SARS-CoV-2 target nucleic acids are NOT DETECTED.  The SARS-CoV-2 RNA is generally detectable in upper and lower respiratory specimens during the acute phase of infection. The lowest concentration of SARS-CoV-2 viral copies this assay can detect is 250 copies / mL. A negative result does not preclude SARS-CoV-2 infection and should not be used as the sole basis for treatment or  other patient management decisions.  A negative result may occur with improper specimen collection / handling, submission of specimen other than nasopharyngeal swab, presence of viral mutation(s) within the areas targeted by this assay, and inadequate number of viral copies (<250 copies / mL). A negative result must be combined with clinical observations, patient history, and epidemiological information.  Fact Sheet for Patients:   StrictlyIdeas.no  Fact Sheet for Healthcare Providers: BankingDealers.co.za  This test is not yet approved or  cleared by the [place] FDA and has been authorized for detection and/or diagnosis of SARS-CoV-2 by FDA under an Emergency Use Authorization (EUA).  This EUA will remain in effect (meaning this test can be used) for the duration of the COVID-19 declaration under Section 564(b)(1) of the Act, 21 U.S.C. section 360bbb-3(b)(1), unless the authorization is terminated or revoked sooner.  Performed at [facility], [address] [place], [place] [address]   Culture, body fluid-bottle     Status: Abnormal   Collection Time: 01/11/20  6:07 PM   Specimen: Fluid  Result Value Ref Range Status   Specimen Description FLUID KNEE  Final   Special Requests   Final    BOTTLES DRAWN AEROBIC AND ANAEROBIC Blood Culture adequate volume   Gram Stain   Final    GRAM POSITIVE COCCI IN CLUSTERS ANAEROBIC BOTTLE ONLY CRITICAL RESULT CALLED TO, READ BACK BY AND VERIFIED WITH: RN Lysbeth Galas  [id number] FCP NO ORGANISMS SEEN AEROBIC BOTTLE ONLY Performed at [facility], [address] [place], [place] [address]    Culture ENTEROCOCCUS FAECALIS (A)  Final   Report Status 01/15/2020 FINAL  Final   Organism ID, Bacteria ENTEROCOCCUS FAECALIS  Final      Susceptibility   Enterococcus faecalis - MIC*    AMPICILLIN <=2 SENSITIVE Sensitive     VANCOMYCIN 1 SENSITIVE Sensitive     GENTAMICIN  SYNERGY SENSITIVE Sensitive     * ENTEROCOCCUS FAECALIS  Gram stain     Status: None   Collection Time: 01/11/20  6:07 PM   Specimen: Fluid  Result Value Ref Range Status   Specimen Description FLUID KNEE  Final   Special Requests NONE  Final   Gram Stain   Final    FEW WBC PRESENT,BOTH PMN AND MONONUCLEAR NO ORGANISMS SEEN Performed at [facility], [address] [place], [place] [address]    Report Status 01/12/2020 FINAL  Final  Aerobic/Anaerobic Culture (surgical/deep wound)     Status: Abnormal (Preliminary result)   Collection Time: 01/16/20 12:41 PM   Specimen: PATH Cytology Misc. fluid; Body Fluid  Result Value Ref Range Status   Specimen Description TISSUE  Final   Special Requests LEFT KNEE TISSUE  Final   Gram Stain   Final    FEW WBC PRESENT,BOTH PMN AND MONONUCLEAR NO ORGANISMS SEEN    Culture (A)  Final    ENTEROCOCCUS FAECALIS NO ANAEROBES ISOLATED; CULTURE IN PROGRESS FOR 5 DAYS CRITICAL RESULT CALLED TO, READ BACK BY AND VERIFIED WITH: RN B.ALAJL AT 1311 ON 01/20/20 BY T.SAAD Performed at [facility], [address] [place], [place] [address]    Report Status PENDING  Incomplete   Organism ID, Bacteria ENTEROCOCCUS FAECALIS  Final      Susceptibility   Enterococcus faecalis - MIC*    AMPICILLIN <=2 SENSITIVE Sensitive     VANCOMYCIN 1 SENSITIVE Sensitive     GENTAMICIN SYNERGY RESISTANT Resistant     * ENTEROCOCCUS FAECALIS     LABS Lab Results  Component Value Date   CREATININE 1.41 (H) 01/20/2020   Lab Results  Component Value Date   WBC 12.0 (H) 01/20/2020   HGB 7.4 (L) 01/20/2020   HCT 23.7 (L) 01/20/2020   MCV 86.6 01/20/2020   PLT 378 01/20/2020   No results found for: ESRSEDRATE, POCTSEDRATE No results found for: CRP  Allergies: No Known Allergies  Discharge antibiotics  Penicillin 24 MU every  24 hours hours   PICC Care per protocol Labs weekly while on IV antibiotics -FAX weekly labs to [phone] CBC w  diff   Comprehensive met panel  CRP   Planned duration of antibiotics 6 weeks  Stop date Oct 2 Follow up clinic date3-4 weeks Dr Ravishankar   David P Fitzgerald, MD  

## 2020-01-20 NOTE — Progress Notes (Signed)
Occupational Therapy Treatment Patient Details Name: Randall Vincent MRN: [id number] DOB: 20-Sep-1940 Today's Date: 01/20/2020    History of present illness 79 y.o. male here with left knee pain and swelling, ultimately septic and needing TKA hardwear removeal and AB spacer placed 8/26.  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  He has not had problems with the left knee until recently.   OT comments  Pt seen for OT/PT co-tx today to address functional transfer training. Pt agreeable and pleasant throughout. STS from recliner with pillow with mod/max a x 2. Cues for hand placement to improve technique. Pt reports feeling like he is putting about 20% weight on LE during transfer.  Stressed importance of maintaining WB status. (Secure chat with MD afterwards confirmed that "20% is OK"). Assisted back to supine with max a x 1 for LE's from PTA. Set up for self feeding with tray. Edu in polar care. Room air but requests O2 after transfer due to SOB but sats remaining [facility]. He did receive a small skin tear on L forearm during transfer. RN aware. Pt progressing slowly towards OT goals. Continues to benefit from skilled OT; continue to recommend STR.    Follow Up Recommendations  SNF;Supervision/Assistance - 24 hour    Equipment Recommendations  Other (comment) (bariatric BSC)    Recommendations for Other Services      Precautions / Restrictions Precautions Precautions: Fall Precaution Comments: High Fall Restrictions Weight Bearing Restrictions: Yes LLE Weight Bearing: Partial weight bearing LLE Partial Weight Bearing Percentage or Pounds: per secure chat with MD Odis Luster, "20% is ok"       Mobility Bed Mobility Overal bed mobility: Needs Assistance Bed Mobility: Sit to Supine     Supine to sit: Mod assist Sit to supine: Max assist   General bed mobility comments: Max  A by PTA for BLE mgt back to bed, supervision for safety from OT for UB/trunk  Transfers Overall transfer level: Needs assistance Equipment used: Rolling walker (2 wheeled) Transfers: Sit to/from Stand Sit to Stand: From elevated surface;Mod assist;+2 safety/equipment;Max assist         General transfer comment: cues for hand placement    Balance Overall balance assessment: Needs assistance Sitting-balance support: Feet supported Sitting balance-Leahy Scale: Good     Standing balance support: During functional activity;Bilateral upper extremity supported Standing balance-Leahy Scale: Poor Standing balance comment: highly reliant on the walker, unable to attain fully upright but given WBing status was relatively safe and stable                           ADL either performed or assessed with clinical judgement   ADL Overall ADL's : Needs assistance/impaired Eating/Feeding: Set up   Grooming: Set up                                 General ADL Comments: MOD/MAX A for ADL transfers, continues to require MOD A for LB ADL tasks.     Vision Baseline Vision/History: Wears glasses Wears Glasses: At all times Patient Visual Report: No change from baseline     Perception     Praxis      Cognition Arousal/Alertness: Awake/alert Behavior During Therapy: WFL for tasks assessed/performed Overall Cognitive Status: Within Functional Limits for tasks assessed  Exercises Other Exercises Other Exercises: Functional transfer training with OT/PTA, cues/instruction in technique to improve safety/independence; pt educated in polar care mgt   Shoulder Instructions       General Comments      Pertinent Vitals/ Pain       Pain Assessment: Faces Faces Pain Scale: Hurts little more Pain Location: L knee with movement Pain Descriptors / Indicators: Aching;Sore;Grimacing;Guarding Pain Intervention(s):  Limited activity within patient's tolerance;Monitored during session;Repositioned;Ice applied  Home Living                                          Prior Functioning/Environment              Frequency  Min 2X/week        Progress Toward Goals  OT Goals(current goals can now be found in the care plan section)  Progress towards OT goals: OT to reassess next treatment  Acute Rehab OT Goals Patient Stated Goal: To get moving like I was OT Goal Formulation: With patient Time For Goal Achievement: 01/28/20  Plan Discharge plan remains appropriate;Frequency remains appropriate    Co-evaluation    PT/OT/SLP Co-Evaluation/Treatment: Yes Reason for Co-Treatment: Complexity of the patient's impairments (multi-system involvement);For patient/therapist safety;To address functional/ADL transfers PT goals addressed during session: Mobility/safety with mobility OT goals addressed during session: ADL's and self-care;Proper use of Adaptive equipment and DME      AM-PAC OT "6 Clicks" Daily Activity     Outcome Measure   Help from another person eating meals?: None Help from another person taking care of personal grooming?: A Little Help from another person toileting, which includes using toliet, bedpan, or urinal?: A Lot Help from another person bathing (including washing, rinsing, drying)?: A Lot Help from another person to put on and taking off regular upper body clothing?: A Little Help from another person to put on and taking off regular lower body clothing?: A Lot 6 Click Score: 16    End of Session Equipment Utilized During Treatment: Gait belt;Rolling walker  OT Visit Diagnosis: Other abnormalities of gait and mobility (R26.89);Muscle weakness (generalized) (M62.81);Pain Pain - Right/Left: Left Pain - part of body: Knee   Activity Tolerance Patient tolerated treatment well   Patient Left in bed;with call bell/phone within reach;with bed alarm set    Nurse Communication Other (comment) (skin tear on L forearm)        Time: 6222-9798 OT Time Calculation (min): 26 min  Charges: OT General Charges $OT Visit: 1 Visit OT Treatments $Therapeutic Activity: 8-22 mins  Richrd Prime, MPH, MS, OTR/L ascom [phone] 01/20/20, 3:40 PM

## 2020-01-20 NOTE — TOC Progression Note (Signed)
Transition of Care [facility]) - Progression Note    Patient Details  Name: Randall Vincent MRN: [id number] Date of Birth: [birth year]  [facility] of [facility] CM/SW Contact  Meriel Flavors, LCSW Phone Number: 01/20/2020, 10:02 AM  Clinical Narrative:    CSW Met with patient to discuss discharge plan. Patient is pretty sure he will want to go to [facility] so CSW confirmed accepting bed offer.     Barriers to Discharge: Continued Medical Work up  Expected Discharge Plan and Services                                                 Social Determinants of Health (SDOH) Interventions    Readmission Risk Interventions No flowsheet data found.

## 2020-01-20 NOTE — Care Management Important Message (Signed)
Important Message  Patient Details  Name: Randall Vincent MRN: [id number] Date of Birth: 1940/12/16   Medicare Important Message Given:  Yes     [name] 01/20/2020, 1:34 PM

## 2020-01-20 NOTE — Progress Notes (Addendum)
Physical Therapy Treatment Patient Details Name: Randall Vincent MRN: [id number] DOB: July 06, 1940 Today's Date: 01/20/2020    History of Present Illness 79 y.o. male here with left knee pain and swelling, ultimately septic and needing TKA hardwear removeal and AB spacer placed 8/26.  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  He has not had problems with the left knee until recently.    PT Comments    Split session with PM co-tx with OT for mobility.  AM - 10:07  Pt sitting EOB without assist upon arrival.  Stated he had been sitting about 45 minutes.  He wants to get to recliner this am.  Stood with mod a x 2 from elevated bed and is able to transfer to recliner at bedside, reporting good ability to maintain NWB with walker.  Returned in PM with OT to assist back to bed.  He is able to stand from recliner with pillow with mod/max a x 2.  He is able to transfer with walker back to bed but reports feeling like he is putting about 20% weight on LE during transfer.  Stressed importance of maintaining WB status.  Assisted back to supine with max a x 1 for LE's and OT remained in room to finish her session.  He is on room air at rest but requests O2 after each transfer due to SOB but sats remaining WFL.  He did receive a small skin tear on L forearm during transfer.  RN aware and will be in to check.    Messaged Dr. Odis Luster regarding guidance on transfers given his increased WB from recliner when fatigued.  Pt is not appropriate for sit to stand lift given WB status and is [employer] appropriate.  Educated pt and written on Public affairs consultant for staff.  Pt voiced understanding.   Message sent to RN regarding appropriate lift for pt if needed with staff.  Will addend note for future sessions after discussion with MD.  Will add OT in conversation if it is not completed before I leave  today so it is relayed appropriately to staff.  Per discussion with Dr. Odis Luster OK to continue with transfers and 20% WB but will continue to encourage NWB if able.   Follow Up Recommendations  SNF;Supervision for mobility/OOB     Equipment Recommendations  None recommended by PT    Recommendations for Other Services       Precautions / Restrictions Precautions Precautions: Fall Precaution Comments: High Fall Restrictions Weight Bearing Restrictions: Yes LLE Weight Bearing: Non weight bearing    Mobility  Bed Mobility Overal bed mobility: Needs Assistance Bed Mobility: Supine to Sit;Sit to Supine     Supine to sit: Mod assist Sit to supine: Max assist   General bed mobility comments: assisted LLE to EOB/floor 2/2 to pain and weakness  Transfers Overall transfer level: Needs assistance Equipment used: Rolling walker (2 wheeled) Transfers: Sit to/from Stand Sit to Stand: From elevated surface;Mod assist;+2 safety/equipment;Max assist         General transfer comment: transfered to from recliner today with therapy staff  Ambulation/Gait Ambulation/Gait assistance: Mod assist;+2 physical assistance Gait Distance (Feet): 3 Feet Assistive device: Rolling walker (2 wheeled) Gait Pattern/deviations: Step-to pattern;Decreased stance time - left;Shuffle Gait velocity: decreased       Optometrist  Modified Rankin (Stroke Patients Only)       Balance Overall balance assessment: Needs assistance Sitting-balance support: Feet supported Sitting balance-Leahy Scale: Good     Standing balance support: During functional activity;Bilateral upper extremity supported Standing balance-Leahy Scale: Poor Standing balance comment: highly reliant on the walker, unable to attain fully upright but given WBing status was relatively safe and stable                            Cognition Arousal/Alertness: Awake/alert Behavior  During Therapy: WFL for tasks assessed/performed Overall Cognitive Status: Within Functional Limits for tasks assessed                                        Exercises      General Comments        Pertinent Vitals/Pain Pain Assessment: Faces Faces Pain Scale: Hurts little more Pain Location: L knee with movement Pain Descriptors / Indicators: Aching;Sore;Grimacing;Guarding Pain Intervention(s): Limited activity within patient's tolerance;Monitored during session;Repositioned;Ice applied    Home Living                      Prior Function            PT Goals (current goals can now be found in the care plan section) Progress towards PT goals: Progressing toward goals    Frequency    7X/week      PT Plan Current plan remains appropriate    Co-evaluation PT/OT/SLP Co-Evaluation/Treatment: Yes Reason for Co-Treatment: Complexity of the patient's impairments (multi-system involvement) PT goals addressed during session: Mobility/safety with mobility OT goals addressed during session: ADL's and self-care      AM-PAC PT "6 Clicks" Mobility   Outcome Measure  Help needed turning from your back to your side while in a flat bed without using bedrails?: A Lot Help needed moving from lying on your back to sitting on the side of a flat bed without using bedrails?: A Lot Help needed moving to and from a bed to a chair (including a wheelchair)?: A Lot Help needed standing up from a chair using your arms (e.g., wheelchair or bedside chair)?: A Lot Help needed to walk in hospital room?: A Lot Help needed climbing 3-5 steps with a railing? : Total 6 Click Score: 11    End of Session Equipment Utilized During Treatment: Gait belt Activity Tolerance: Patient tolerated treatment well;Patient limited by fatigue Patient left: in bed;with call bell/phone within reach;with bed alarm set Nurse Communication: Mobility status;Need for lift equipment Pain -  Right/Left: Left Pain - part of body: Knee     Time: 7035-0093 PT Time Calculation (min) (ACUTE ONLY): 26 min  Charges:  $Therapeutic Activity: 23-37 mins                    Danielle Dess, PTA 01/20/20, 2:29 PM

## 2020-01-20 NOTE — Progress Notes (Signed)
[facility] CLINIC INFECTIOUS DISEASE PROGRESS NOTE Date of Admission:  01/10/2020     ID: Randall Vincent is a 79 y.o. male with  PJI Principal Problem:   Melena Active Problems:   CAD, multiple vessel   S/P CABG x 2   Atrial fibrillation status post cardioversion 10/22/19 [facility])   Essential hypertension   DM (diabetes mellitus), type 2 (HCC)   Symptomatic anemia   Acute blood loss anemia   Suspect Septic arthritis (HCC)   Chronic anticoagulation   History of bilateral knee replacement   Chronic kidney disease, stage 3a   Acute on chronic congestive heart failure (HCC)   Elevated troponin   Intestinal bypass or anastomosis status   Diverticulosis of colon without hemorrhage   Subjective: Feels ok post op. NO fevers   ROS  Eleven systems are reviewed and negative except per hpi  Medications:  Antibiotics Given (last 72 hours)    Date/Time Action Medication Dose Rate   01/17/20 1716 New Bag/Given   cefTRIAXone (ROCEPHIN) 2 g in sodium chloride 0.9 % 100 mL IVPB 2 g 200 mL/hr   01/18/20 1209 New Bag/Given   vancomycin (VANCOREADY) IVPB 1500 mg/300 mL 1,500 mg 150 mL/hr   01/18/20 1641 New Bag/Given   cefTRIAXone (ROCEPHIN) 2 g in sodium chloride 0.9 % 100 mL IVPB 2 g 200 mL/hr   01/19/20 1221 New Bag/Given   vancomycin (VANCOREADY) IVPB 1500 mg/300 mL 1,500 mg 150 mL/hr   01/19/20 1729 New Bag/Given   cefTRIAXone (ROCEPHIN) 2 g in sodium chloride 0.9 % 100 mL IVPB 2 g 200 mL/hr   01/20/20 1211 New Bag/Given   vancomycin (VANCOREADY) IVPB 1500 mg/300 mL 1,500 mg 150 mL/hr     . sodium chloride   Intravenous Once  . amiodarone  200 mg Oral Daily  . atorvastatin  40 mg Oral Daily  . Chlorhexidine Gluconate Cloth  6 each Topical Daily  . diltiazem  60 mg Oral Q8H  . docusate sodium  100 mg Oral BID  . insulin aspart  0-15 Units Subcutaneous Q4H  . polyethylene glycol  17 g Oral Daily  . ramipril  20 mg Oral Daily  . spironolactone  25 mg Oral Daily  . vitamin B-12  1,000  mcg Oral Daily    Objective: Vital signs in last 24 hours: Temp:  [97.8 F (36.6 C)-98.4 F (36.9 C)] 98 F (36.7 C) (08/30 1156) Pulse Rate:  [67-81] 67 (08/30 1438) Resp:  [18-25] 18 (08/30 1156) BP: (142-161)/(48-69) 147/57 (08/30 1438) SpO2:  [95 %-99 %] 97 % (08/30 1156) Weight:  [129.3 kg] 129.3 kg (08/30 0414) Physical Exam  Constitutional: He is oriented to person, place, and time.obese HEENT anicteric Mouth/Throat: Oropharynx is clear and moist. No oropharyngeal exudate.  Cardiovascular: Normal rate, regular rhythm and normal heart sounds.  Pulmonary/Chest: Effort normal and breath sounds normal. No respiratory distress. He has no wheezes.  Abdominal: Soft. Bowel sounds are normal. He exhibits no distension. There is no tenderness.  Lymphadenopathy: He has no cervical adenopathy.  Neurological: He is alert and oriented to person, place, and time Ext L knee in polar ice immobilizer .  Skin: Skin is warm and dry. No rash noted. No erythema.  Psychiatric: He has a normal mood and affect. His behavior is normal.      Lab Results Recent Labs    01/19/20 0203 01/19/20 1007 01/20/20 0223 01/20/20 1107  WBC 12.4*  --  12.0*  --   HGB 7.3*  7.2*   < >  7.3* 7.4*  HCT 22.5*  22.1*   < > 23.3* 23.7*  NA 134*  --  137  --   K 3.3*  --  4.1  --   CL 103  --  108  --   CO2 22  --  24  --   BUN 12  --  16  --   CREATININE 1.26*  --  1.41*  --    < > = values in this interval not displayed.    Microbiology: Results for orders placed or performed during the hospital encounter of 01/10/20  Culture, blood (routine x 2)     Status: None   Collection Time: 01/11/20 12:37 AM   Specimen: BLOOD  Result Value Ref Range Status   Specimen Description BLOOD RIGHT ANTECUBITAL  Final   Special Requests   Final    BOTTLES DRAWN AEROBIC AND ANAEROBIC Blood Culture results may not be optimal due to an excessive volume of blood received in culture bottles   Culture   Final    NO  GROWTH 5 DAYS Performed at [facility], [address] [place], [place] [address]    Report Status 01/16/2020 FINAL  Final  Culture, blood (routine x 2)     Status: None   Collection Time: 01/11/20 12:37 AM   Specimen: BLOOD  Result Value Ref Range Status   Specimen Description BLOOD LEFT ANTECUBITAL  Final   Special Requests   Final    BOTTLES DRAWN AEROBIC AND ANAEROBIC Blood Culture results may not be optimal due to an excessive volume of blood received in culture bottles   Culture   Final    NO GROWTH 5 DAYS Performed at [facility], [address], [place], [place] [address]    Report Status 01/16/2020 FINAL  Final  SARS Coronavirus 2 by RT PCR (hospital order, performed in [facility] hospital lab) Nasopharyngeal Nasopharyngeal Swab     Status: None   Collection Time: 01/11/20  6:14 AM   Specimen: Nasopharyngeal Swab  Result Value Ref Range Status   SARS Coronavirus 2 NEGATIVE NEGATIVE Final    Comment: (NOTE) SARS-CoV-2 target nucleic acids are NOT DETECTED.  The SARS-CoV-2 RNA is generally detectable in upper and lower respiratory specimens during the acute phase of infection. The lowest concentration of SARS-CoV-2 viral copies this assay can detect is 250 copies / mL. A negative result does not preclude SARS-CoV-2 infection and should not be used as the sole basis for treatment or other patient management decisions.  A negative result may occur with improper specimen collection / handling, submission of specimen other than nasopharyngeal swab, presence of viral mutation(s) within the areas targeted by this assay, and inadequate number of viral copies (<250 copies / mL). A negative result must be combined with clinical observations, patient history, and epidemiological information.  Fact Sheet for Patients:   BoilerBrush.com.cy  Fact Sheet for Healthcare Providers: https://pope.com/  This test is  not yet approved or  cleared by the [place] FDA and has been authorized for detection and/or diagnosis of SARS-CoV-2 by FDA under an Emergency Use Authorization (EUA).  This EUA will remain in effect (meaning this test can be used) for the duration of the COVID-19 declaration under Section 564(b)(1) of the Act, 21 U.S.C. section 360bbb-3(b)(1), unless the authorization is terminated or revoked sooner.  Performed at [facility], [address], [place], [place] [address]   Culture, body fluid-bottle     Status: Abnormal  Collection Time: 01/11/20  6:07 PM   Specimen: Fluid  Result Value Ref Range Status   Specimen Description FLUID KNEE  Final   Special Requests   Final    BOTTLES DRAWN AEROBIC AND ANAEROBIC Blood Culture adequate volume   Gram Stain   Final    GRAM POSITIVE COCCI IN CLUSTERS ANAEROBIC BOTTLE ONLY CRITICAL RESULT CALLED TO, READ BACK BY AND VERIFIED WITH: RN Sheria Lang [id number] FCP NO ORGANISMS SEEN AEROBIC BOTTLE ONLY Performed at [facility], [address] [place], [place] [address]    Culture ENTEROCOCCUS FAECALIS (A)  Final   Report Status 01/15/2020 FINAL  Final   Organism ID, Bacteria ENTEROCOCCUS FAECALIS  Final      Susceptibility   Enterococcus faecalis - MIC*    AMPICILLIN <=2 SENSITIVE Sensitive     VANCOMYCIN 1 SENSITIVE Sensitive     GENTAMICIN SYNERGY SENSITIVE Sensitive     * ENTEROCOCCUS FAECALIS  Gram stain     Status: None   Collection Time: 01/11/20  6:07 PM   Specimen: Fluid  Result Value Ref Range Status   Specimen Description FLUID KNEE  Final   Special Requests NONE  Final   Gram Stain   Final    FEW WBC PRESENT,BOTH PMN AND MONONUCLEAR NO ORGANISMS SEEN Performed at [facility], [address], [place], [place] [address]    Report Status 01/12/2020 FINAL  Final  Aerobic/Anaerobic Culture (surgical/deep wound)     Status: Abnormal (Preliminary result)   Collection Time: 01/16/20 12:41 PM    Specimen: PATH Cytology Misc. fluid; Body Fluid  Result Value Ref Range Status   Specimen Description TISSUE  Final   Special Requests LEFT KNEE TISSUE  Final   Gram Stain   Final    FEW WBC PRESENT,BOTH PMN AND MONONUCLEAR NO ORGANISMS SEEN    Culture (A)  Final    ENTEROCOCCUS FAECALIS NO ANAEROBES ISOLATED; CULTURE IN PROGRESS FOR 5 DAYS CRITICAL RESULT CALLED TO, READ BACK BY AND VERIFIED WITH: RN B.ALAJL AT 1311 ON 01/20/20 BY T.SAAD Performed at [facility], [address] [place], [place] [address]    Report Status PENDING  Incomplete   Organism ID, Bacteria ENTEROCOCCUS FAECALIS  Final      Susceptibility   Enterococcus faecalis - MIC*    AMPICILLIN <=2 SENSITIVE Sensitive     VANCOMYCIN 1 SENSITIVE Sensitive     GENTAMICIN SYNERGY RESISTANT Resistant     * ENTEROCOCCUS FAECALIS    Studies/Results: No results found.  Assessment/Plan: Impression/Recommendation Lt knee prosthetic joint infection- aspiration of the synovial fluid on 8/21 positive for enterococcus -no cell count available Underwent explantation -  Cx with enterococcus S amp Blood culture neg  Place PICC line - ordered Continuous pcn for 6 weeks. Discussed with patient.  FU as otpt with Dr Rivka Safer  Thank you very much for the consult. Will follow with you.  Mick Sell   01/20/2020, 3:58 PM

## 2020-01-20 NOTE — Progress Notes (Signed)
Subjective:  Patient reports pain as mild.    Objective:   VITALS:   Vitals:   01/20/20 0414 01/20/20 0822 01/20/20 1156 01/20/20 1438  BP: (!) 151/58 (!) 161/64 (!) 142/48 (!) 147/57  Pulse: 67 69 68 67  Resp: 20 19 18    Temp: 97.9 F (36.6 C) 98.4 F (36.9 C) 98 F (36.7 C)   TempSrc: Oral     SpO2: 99% 98% 97%   Weight: 129.3 kg     Height:        PHYSICAL EXAM:  Neurologically intact ABD soft Neurovascular intact Sensation intact distally Intact pulses distally Dorsiflexion/Plantar flexion intact Incision: scant drainage No cellulitis present Compartment soft dressing changed  LABS  Results for orders placed or performed during the hospital encounter of 01/10/20 (from the past 24 hour(s))  Glucose, capillary     Status: Abnormal   Collection Time: 01/19/20  4:35 PM  Result Value Ref Range   Glucose-Capillary 154 (H) 70 - 99 mg/dL  Hemoglobin and hematocrit, blood     Status: Abnormal   Collection Time: 01/19/20  5:55 PM  Result Value Ref Range   Hemoglobin 7.8 (L) 13.0 - 17.0 g/dL   HCT 01/21/20 (L) 39 - 52 %  Glucose, capillary     Status: Abnormal   Collection Time: 01/19/20  7:50 PM  Result Value Ref Range   Glucose-Capillary 192 (H) 70 - 99 mg/dL  Glucose, capillary     Status: Abnormal   Collection Time: 01/19/20 11:58 PM  Result Value Ref Range   Glucose-Capillary 165 (H) 70 - 99 mg/dL  CBC     Status: Abnormal   Collection Time: 01/20/20  2:23 AM  Result Value Ref Range   WBC 12.0 (H) 4.0 - 10.5 K/uL   RBC 2.69 (L) 4.22 - 5.81 MIL/uL   Hemoglobin 7.3 (L) 13.0 - 17.0 g/dL   HCT 01/22/20 (L) 39 - 52 %   MCV 86.6 80.0 - 100.0 fL   MCH 27.1 26.0 - 34.0 pg   MCHC 31.3 30.0 - 36.0 g/dL   RDW 29.9 (H) 37.1 - 69.6 %   Platelets 378 150 - 400 K/uL   nRBC 0.0 0.0 - 0.2 %  Basic metabolic panel     Status: Abnormal   Collection Time: 01/20/20  2:23 AM  Result Value Ref Range   Sodium 137 135 - 145 mmol/L   Potassium 4.1 3.5 - 5.1 mmol/L   Chloride  108 98 - 111 mmol/L   CO2 24 22 - 32 mmol/L   Glucose, Bld 167 (H) 70 - 99 mg/dL   BUN 16 8 - 23 mg/dL   Creatinine, Ser 01/22/20 (H) 0.61 - 1.24 mg/dL   Calcium 7.5 (L) 8.9 - 10.3 mg/dL   GFR calc non Af Amer 47 (L) >60 mL/min   GFR calc Af Amer 55 (L) >60 mL/min   Anion gap 5 5 - 15  Magnesium     Status: None   Collection Time: 01/20/20  2:23 AM  Result Value Ref Range   Magnesium 1.8 1.7 - 2.4 mg/dL  Glucose, capillary     Status: Abnormal   Collection Time: 01/20/20  4:12 AM  Result Value Ref Range   Glucose-Capillary 159 (H) 70 - 99 mg/dL  Glucose, capillary     Status: Abnormal   Collection Time: 01/20/20  8:24 AM  Result Value Ref Range   Glucose-Capillary 132 (H) 70 - 99 mg/dL  Hemoglobin and hematocrit, blood  Status: Abnormal   Collection Time: 01/20/20 11:07 AM  Result Value Ref Range   Hemoglobin 7.4 (L) 13.0 - 17.0 g/dL   HCT 16.1 (L) 39 - 52 %  Glucose, capillary     Status: Abnormal   Collection Time: 01/20/20 11:54 AM  Result Value Ref Range   Glucose-Capillary 179 (H) 70 - 99 mg/dL    No results found.  Assessment/Plan: 4 Days Post-Op   Principal Problem:   Melena Active Problems:   CAD, multiple vessel   S/P CABG x 2   Atrial fibrillation status post cardioversion 10/22/19 [facility])   Essential hypertension   DM (diabetes mellitus), type 2 (HCC)   Symptomatic anemia   Acute blood loss anemia   Suspect Septic arthritis (HCC)   Chronic anticoagulation   History of bilateral knee replacement   Chronic kidney disease, stage 3a   Acute on chronic congestive heart failure (HCC)   Elevated troponin   Intestinal bypass or anastomosis status   Diverticulosis of colon without hemorrhage   Advance diet Up with therapy Discharge to SNF  Continue IV antibiotics.  Cultures pending.  Patient is nonweightbearing on the left lower extremity.  He will continue with physical therapy.  Continue strict blood sugar control. Follow up in office sept 7 for staple  removal. call for appt [phone]   Altamese Cabal , PA-C 01/20/2020, 3:27 PM

## 2020-01-20 NOTE — Progress Notes (Signed)
PICC order received. Floor RN made aware PICC will be placed tomorrow 8/31. Patient has 2 working PIV at this time.

## 2020-01-20 NOTE — Progress Notes (Signed)
MD notified of patient's shortness of breath overnight and bilateral arm swelling. MD also notified that this RN stopped continous IV fluids. Lungs diminished but clear. Will continue to monitor.

## 2020-01-20 NOTE — Progress Notes (Signed)
PROGRESS NOTE    Randall Vincent   POE:[id number]  DOB: 1941-05-14  PCP: Elspeth Cho., MD    DOA: 01/10/2020 LOS: 10   Brief Narrative   Randall Vincent is a 79 y.o. male who complains of left knee pain and swelling for the last few days.  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  He has never had problems with the left knee until recently.  He was seen in our office at [facility] earlier this week complaining of pain and swelling.  He was scheduled for a bone scan to evaluate him for prosthetic loosening versus infection by Dr. Odis Luster.  However he developed GI bleeding in the last day or so and has presented to the emergency room for this reason primarily.  Hemoglobin is down to 7.2 from a high of 13.5 earlier this year.  White blood count is 18,100.  Temperature is about 100 F.  The left knee pain and swelling was noted as well as well as a slightly elevated white count and mild fever.  He had cardioversion in June and July and has remained in sinus rhythm he says.  He was switched from Eliquis to Pradaxa at that time and took his last Pradaxa dose yesterday.       Assessment & Plan   Principal Problem:   Melena Active Problems:   CAD, multiple vessel   S/P CABG x 2   Atrial fibrillation status post cardioversion 10/22/19 [facility])   Essential hypertension   DM (diabetes mellitus), type 2 (HCC)   Symptomatic anemia   Acute blood loss anemia   Suspect Septic arthritis (HCC)   Chronic anticoagulation   History of bilateral knee replacement   Chronic kidney disease, stage 3a   Acute on chronic congestive heart failure (HCC)   Elevated troponin   Intestinal bypass or anastomosis status   Diverticulosis of colon without hemorrhage   Acute blood loss anemiasecondary GI bleeding - presented with melena in setting of anticoagulation.   EGD on 8/23  showed possible Barrett's esophagus changes (biopsy contraindicated with bleeding), erosive gastropathy but no signs of recent of active bleeding.   Colonoscopy on 8/25 showed diverticulosis at the colonic anastamosis, patent surgical anastamosis, non-bleeding internal hemorrhoids.  No bleeding source identified. Hbg nadir was 6.4 on 8/21.  Continuing to trend and has been in 7's past few days. --GI following - follow up in clinic in 2-4 weeks -Hold pradaxa and all blood thinners for now --d/c IV Protonix --not on PPI at home, so will not start at this time, consider in outpatient setting if indicated -Serial H&H's q8h --Transfuse if Hbg < 7.0 or active bleeding -Monitor closely. Has been hemodynamically stable.   --Page GI if active bleeding or unstable.   Septic arthritis of left knee / History of bilateral knee replacement - POA.  History of knee replacement several years ago presenting with a several week history of worsening pain and swelling of the left knee.   --Ortho is consulted --Had surgery 8/26, removed L knee prosthesis and placed antibiotic spacer --ID is following --On Vancomycin and Rocephin empirically pending cx's --Follow up operative tissue cultures (reincubated for better growth)   Mild acute on chronic systolic CHF - Resolved.  Presented with mildshortness of breathabove baseline, pulmonary vascular congestion on chest x-ray, BNP elevated at 400, all consistent with decompensated CHF (  but SOB could be due to anemia as well).  No prior echo's at time of admission. Echo on 8/21 showed EF 50-55%, diastolic parameters not evaluated.   -Needs beta blocker if BP allows --Resume ramipril and Aldactone --Will add low dose beta blocker if BP will tolerate, resume above and monitor before starting -Stop PO Lasix - edema has resolved.  Not on Lasix at home.   --Monitor for recurrent edema --Monitor BP, electrolytes and renal function closely   Elevated troponin - POA,  secondary to demand ischemia in setting of anemia and renal failure reducing clearance.   --Monitor for chest pain and get stat EKG and troponin as needed.  CAD, multiple vessel, S/P CABG x 2 - stable.   -Holding aspirin and metoprolol for now  --Continue statin, aspirin held, continue diltiazem as BP tolerates --Hold Aldactone due to renal failure  Atrial fibrillation status post cardioversion 10/22/19.  Currently in sinus rhythm.  Recently switched from Pradaxa to Eliquis on 12/18/2019 due to concern for renal function.  Pradaxa discontinued for now due to GI bleeding.  Essential hypertension - chronic.  Continued on home Diltiazem for A-fib.  Resume ramipril and Aldactione which were on hold due to AKI and low BP which has resolved.  Monitor BP closely.  Type 2 Diabetes - well controlled, A1C is 6.7%.   -CBG's every 4 while n.p.o. with sliding scale coverage. --Home regimen is weekly Ozempic only (takes injection on Thursdays).  --Metformin previously discontinued due to recent renal insufficiency.  Acute kidney injury - POA, resolved.  Likely pre-renal in setting of bleeding and hypotension.  Avoid nephrotoxins and hypotension.  Renally dose meds as indicated.   GFR at baseline.  Obesity: Body mass index is 43.33 kg/m.  Complicates overall care and prognosis.  DVT prophylaxis: SCDs Start: 01/18/20 1319 SCDs Start: 01/10/20 2307   Diet:  Diet Orders (From admission, onward)    Start     Ordered   01/16/20 1620  Diet regular Room service appropriate? Yes; Fluid consistency: Thin  Diet effective now       Question Answer Comment  Room service appropriate? Yes   Fluid consistency: Thin      01/16/20 1619            Code Status: Full Code    Subjective 01/20/20    Patient seen up in chair this morning.  Says he was short of breath with laying flat to be moved up in bed, feels better now.  His left knee pain with mobility about 6 or 7, maybe 1 at rest sitting still.  No  other acute complaints.      Disposition Plan & Communication   Status is: Inpatient  Remains inpatient appropriate because: Has AKI.  SNF placement and final antibiotic recommendations are pending.      Dispo: The patient is from: Home              Anticipated d/c is to: Home              Anticipated d/c date is: 2 days              Patient currently is not medically stable to d/c.    Family Communication: none at bedside during encounter, son updated on rounds at bedside 8.27. Will attempt to call.   Consults, Procedures, Significant Events   Consultants:   Orthopedics  Infectious Disease  GI  Procedures:   EGD  Colonoscopy   Objective   Vitals:  01/19/20 1428 01/19/20 1636 01/19/20 1948 01/20/20 0414  BP: (!) 150/58 (!) 158/69 (!) 155/64 (!) 151/58  Pulse: 73 77 81 67  Resp:  19 (!) 25 20  Temp:  97.8 F (36.6 C) 98.2 F (36.8 C) 97.9 F (36.6 C)  TempSrc:   Oral Oral  SpO2:  95% 95% 99%  Weight:    129.3 kg  Height:        Intake/Output Summary (Last 24 hours) at 01/20/2020 0748 Last data filed at 01/19/2020 1850 Gross per 24 hour  Intake 2069.76 ml  Output 1050 ml  Net 1019.76 ml   Filed Weights   01/18/20 0315 01/19/20 0445 01/20/20 0414  Weight: 128.3 kg 128 kg 129.3 kg    Physical Exam:  General exam: awake, alert, no acute distress, obese Respiratory system: CTAB diminished bases, normal respiratory effort. Cardiovascular system: normal S1/S2, RRR Gastrointestinal system: soft, NT, ND, positive bowel sounds Central nervous system: A&O x4. no gross focal neurologic deficits, normal speech Extremities: mild B/L upper extremity edema recurred, LLE in brace, RLE edema  Psychiatry: normal mood, congruent affect, judgement and insight appear normal  Labs   Data Reviewed: I have personally reviewed following labs and imaging studies  CBC: Recent Labs  Lab 01/16/20 0239 01/16/20 0956 01/17/20 0108 01/17/20 1010 01/18/20 0156  01/18/20 1022 01/18/20 1817 01/19/20 0203 01/19/20 1007 01/19/20 1755 01/20/20 0223  WBC 10.3  --  13.4*  --  13.0*  --   --  12.4*  --   --  12.0*  HGB 7.6*   < > 8.3*   < > 7.4*   < > 7.9* 7.3*  7.2* 7.4* 7.8* 7.3*  HCT 24.2*   < > 24.9*   < > 22.9*   < > 24.4* 22.5*  22.1* 23.5* 24.7* 23.3*  MCV 85.8  --  83.6  --  83.6  --   --  82.7  --   --  86.6  PLT 320  --  314  --  334  --   --  336  --   --  378   < > = values in this interval not displayed.   Basic Metabolic Panel: Recent Labs  Lab 01/16/20 0239 01/16/20 0239 01/17/20 0108 01/18/20 0156 01/18/20 1022 01/19/20 0203 01/20/20 0223  NA 137   < > 136 135 135 134* 137  K 3.9   < > 4.2 4.3 3.6 3.3* 4.1  CL 107   < > 105 104 105 103 108  CO2 22   < > 24 24 20* 22 24  GLUCOSE 127*   < > 242* 159* 165* 173* 167*  BUN 14   < > 14 11 11 12 16   CREATININE 1.21   < > 1.16 1.41* 1.26* 1.26* 1.41*  CALCIUM 7.5*   < > 7.8* 7.5* 7.8* 7.5* 7.5*  MG 1.7  --  2.0 1.7  --  1.8 1.8   < > = values in this interval not displayed.   GFR: Estimated Creatinine Clearance: 56.7 mL/min (A) (by C-G formula based on SCr of 1.41 mg/dL (H)). Liver Function Tests: No results for input(s): AST, ALT, ALKPHOS, BILITOT, PROT, ALBUMIN in the last 168 hours. No results for input(s): LIPASE, AMYLASE in the last 168 hours. No results for input(s): AMMONIA in the last 168 hours. Coagulation Profile: No results for input(s): INR, PROTIME in the last 168 hours. Cardiac Enzymes: No results for input(s): CKTOTAL, CKMB, CKMBINDEX, TROPONINI in the last 168  hours. BNP (last 3 results) No results for input(s): PROBNP in the last 8760 hours. HbA1C: No results for input(s): HGBA1C in the last 72 hours. CBG: Recent Labs  Lab 01/19/20 1136 01/19/20 1635 01/19/20 1950 01/19/20 2358 01/20/20 0412  GLUCAP 145* 154* 192* 165* 159*   Lipid Profile: No results for input(s): CHOL, HDL, LDLCALC, TRIG, CHOLHDL, LDLDIRECT in the last 72 hours. Thyroid  Function Tests: No results for input(s): TSH, T4TOTAL, FREET4, T3FREE, THYROIDAB in the last 72 hours. Anemia Panel: No results for input(s): VITAMINB12, FOLATE, FERRITIN, TIBC, IRON, RETICCTPCT in the last 72 hours. Sepsis Labs: No results for input(s): PROCALCITON, LATICACIDVEN in the last 168 hours.  Recent Results (from the past 240 hour(s))  Culture, blood (routine x 2)     Status: None   Collection Time: 01/11/20 12:37 AM   Specimen: BLOOD  Result Value Ref Range Status   Specimen Description BLOOD RIGHT ANTECUBITAL  Final   Special Requests   Final    BOTTLES DRAWN AEROBIC AND ANAEROBIC Blood Culture results may not be optimal due to an excessive volume of blood received in culture bottles   Culture   Final    NO GROWTH 5 DAYS Performed at [facility], [address] [place], [place] [address]    Report Status 01/16/2020 FINAL  Final  Culture, blood (routine x 2)     Status: None   Collection Time: 01/11/20 12:37 AM   Specimen: BLOOD  Result Value Ref Range Status   Specimen Description BLOOD LEFT ANTECUBITAL  Final   Special Requests   Final    BOTTLES DRAWN AEROBIC AND ANAEROBIC Blood Culture results may not be optimal due to an excessive volume of blood received in culture bottles   Culture   Final    NO GROWTH 5 DAYS Performed at [facility], [address], [place], [place] [address]    Report Status 01/16/2020 FINAL  Final  SARS Coronavirus 2 by RT PCR (hospital order, performed in [facility] hospital lab) Nasopharyngeal Nasopharyngeal Swab     Status: None   Collection Time: 01/11/20  6:14 AM   Specimen: Nasopharyngeal Swab  Result Value Ref Range Status   SARS Coronavirus 2 NEGATIVE NEGATIVE Final    Comment: (NOTE) SARS-CoV-2 target nucleic acids are NOT DETECTED.  The SARS-CoV-2 RNA is generally detectable in upper and lower respiratory specimens during the acute phase of infection. The lowest concentration of SARS-CoV-2 viral copies  this assay can detect is 250 copies / mL. A negative result does not preclude SARS-CoV-2 infection and should not be used as the sole basis for treatment or other patient management decisions.  A negative result may occur with improper specimen collection / handling, submission of specimen other than nasopharyngeal swab, presence of viral mutation(s) within the areas targeted by this assay, and inadequate number of viral copies (<250 copies / mL). A negative result must be combined with clinical observations, patient history, and epidemiological information.  Fact Sheet for Patients:   BoilerBrush.com.cy  Fact Sheet for Healthcare Providers: https://pope.com/  This test is not yet approved or  cleared by the [place] FDA and has been authorized for detection and/or diagnosis of SARS-CoV-2 by FDA under an Emergency Use Authorization (EUA).  This EUA will remain in effect (meaning this test can be used) for the duration of the COVID-19 declaration under Section 564(b)(1) of the Act, 21 U.S.C. section 360bbb-3(b)(1), unless the authorization is terminated or revoked sooner.  Performed at [employer]  [facility], [address] [place], [place] [address]   Culture, body fluid-bottle     Status: Abnormal   Collection Time: 01/11/20  6:07 PM   Specimen: Fluid  Result Value Ref Range Status   Specimen Description FLUID KNEE  Final   Special Requests   Final    BOTTLES DRAWN AEROBIC AND ANAEROBIC Blood Culture adequate volume   Gram Stain   Final    GRAM POSITIVE COCCI IN CLUSTERS ANAEROBIC BOTTLE ONLY CRITICAL RESULT CALLED TO, READ BACK BY AND VERIFIED WITH: RN Sheria Lang [id number] FCP NO ORGANISMS SEEN AEROBIC BOTTLE ONLY Performed at [facility], [address] [place], [place] [address]    Culture ENTEROCOCCUS FAECALIS (A)  Final   Report Status 01/15/2020 FINAL  Final   Organism ID, Bacteria ENTEROCOCCUS  FAECALIS  Final      Susceptibility   Enterococcus faecalis - MIC*    AMPICILLIN <=2 SENSITIVE Sensitive     VANCOMYCIN 1 SENSITIVE Sensitive     GENTAMICIN SYNERGY SENSITIVE Sensitive     * ENTEROCOCCUS FAECALIS  Gram stain     Status: None   Collection Time: 01/11/20  6:07 PM   Specimen: Fluid  Result Value Ref Range Status   Specimen Description FLUID KNEE  Final   Special Requests NONE  Final   Gram Stain   Final    FEW WBC PRESENT,BOTH PMN AND MONONUCLEAR NO ORGANISMS SEEN Performed at [facility], [address], [place], [place] [address]    Report Status 01/12/2020 FINAL  Final  Aerobic/Anaerobic Culture (surgical/deep wound)     Status: Abnormal (Preliminary result)   Collection Time: 01/16/20 12:41 PM   Specimen: PATH Cytology Misc. fluid; Body Fluid  Result Value Ref Range Status   Specimen Description TISSUE  Final   Special Requests LEFT KNEE TISSUE  Final   Gram Stain   Final    FEW WBC PRESENT,BOTH PMN AND MONONUCLEAR NO ORGANISMS SEEN    Culture (A)  Final    ENTEROCOCCUS FAECALIS SUSCEPTIBILITIES TO FOLLOW Performed at [facility], [address], [place], [place] [address]    Report Status PENDING  Incomplete      Imaging Studies   No results found.   Medications   Scheduled Meds: . sodium chloride   Intravenous Once  . amiodarone  200 mg Oral Daily  . atorvastatin  40 mg Oral Daily  . Chlorhexidine Gluconate Cloth  6 each Topical Daily  . diltiazem  60 mg Oral Q8H  . docusate sodium  100 mg Oral BID  . insulin aspart  0-15 Units Subcutaneous Q4H  . polyethylene glycol  17 g Oral Daily  . ramipril  20 mg Oral Daily  . spironolactone  25 mg Oral Daily  . vitamin B-12  1,000 mcg Oral Daily   Continuous Infusions: . sodium chloride 75 mL/hr at 01/20/20 0519  . sodium chloride 500 mL (01/14/20 1643)  . cefTRIAXone (ROCEPHIN)  IV Stopped (01/19/20 1759)  . vancomycin Stopped (01/19/20 1429)       LOS: 10 days    Time  spent: 25 minutes    Pennie Banter, DO [facility]  01/20/2020, 7:48 AM    If 7PM-7AM, please contact night-coverage. How to contact the [facility] Attending or Consulting provider 7A - 7P or covering provider during after hours 7P -7A, for this patient?    1. Check the care team in [facility] and look for a) attending/consulting [facility] provider  listed and b) the [facility] team listed 2. Log into [email or web address] and use [employer]'s universal password to access. If you do not have the password, please contact the hospital operator. 3. Locate the [facility] provider you are looking for under [facility] and page to a number that you can be directly reached. 4. If you still have difficulty reaching the provider, please page the [facility] (Director on Call) for the Hospitalists listed on amion for assistance.

## 2020-01-21 LAB — GLUCOSE, CAPILLARY
Glucose-Capillary: 128 mg/dL — ABNORMAL HIGH (ref 70–99)
Glucose-Capillary: 133 mg/dL — ABNORMAL HIGH (ref 70–99)
Glucose-Capillary: 147 mg/dL — ABNORMAL HIGH (ref 70–99)
Glucose-Capillary: 161 mg/dL — ABNORMAL HIGH (ref 70–99)
Glucose-Capillary: 169 mg/dL — ABNORMAL HIGH (ref 70–99)
Glucose-Capillary: 190 mg/dL — ABNORMAL HIGH (ref 70–99)

## 2020-01-21 LAB — AEROBIC/ANAEROBIC CULTURE W GRAM STAIN (SURGICAL/DEEP WOUND)

## 2020-01-21 LAB — CBC
HCT: 22.5 % — ABNORMAL LOW (ref 39.0–52.0)
Hemoglobin: 7 g/dL — ABNORMAL LOW (ref 13.0–17.0)
MCH: 27.2 pg (ref 26.0–34.0)
MCHC: 31.1 g/dL (ref 30.0–36.0)
MCV: 87.5 fL (ref 80.0–100.0)
Platelets: 407 10*3/uL — ABNORMAL HIGH (ref 150–400)
RBC: 2.57 MIL/uL — ABNORMAL LOW (ref 4.22–5.81)
RDW: 16.8 % — ABNORMAL HIGH (ref 11.5–15.5)
WBC: 10.1 10*3/uL (ref 4.0–10.5)
nRBC: 0 % (ref 0.0–0.2)

## 2020-01-21 LAB — BASIC METABOLIC PANEL
Anion gap: 6 (ref 5–15)
BUN: 14 mg/dL (ref 8–23)
CO2: 26 mmol/L (ref 22–32)
Calcium: 7.7 mg/dL — ABNORMAL LOW (ref 8.9–10.3)
Chloride: 106 mmol/L (ref 98–111)
Creatinine, Ser: 1.2 mg/dL (ref 0.61–1.24)
GFR calc Af Amer: >60 mL/min (ref >60)
GFR calc non Af Amer: 58 mL/min — ABNORMAL LOW (ref >60)
Glucose, Bld: 147 mg/dL — ABNORMAL HIGH (ref 70–99)
Potassium: 4 mmol/L (ref 3.5–5.1)
Sodium: 138 mmol/L (ref 135–145)

## 2020-01-21 LAB — RESPIRATORY PANEL BY RT PCR (FLU A&B, COVID)
Influenza A by PCR: NEGATIVE
Influenza B by PCR: NEGATIVE
SARS Coronavirus 2 by RT PCR: NEGATIVE

## 2020-01-21 LAB — HEMOGLOBIN AND HEMATOCRIT, BLOOD
HCT: 23.9 % — ABNORMAL LOW (ref 39.0–52.0)
HCT: 22.9 % — ABNORMAL LOW (ref 39.0–52.0)
Hemoglobin: 7.5 g/dL — ABNORMAL LOW (ref 13.0–17.0)
Hemoglobin: 7.3 g/dL — ABNORMAL LOW (ref 13.0–17.0)

## 2020-01-21 LAB — MAGNESIUM: Magnesium: 2.2 mg/dL (ref 1.7–2.4)

## 2020-01-21 MED ORDER — FUROSEMIDE 20 MG PO TABS
20.0000 mg | ORAL_TABLET | Freq: Every day | ORAL | Status: DC
  Administered 2020-01-21 – 2020-01-22 (×2): 20 mg via ORAL
  Filled 2020-01-21 (×2): qty 1

## 2020-01-21 MED ORDER — CARVEDILOL 6.25 MG PO TABS
6.2500 mg | ORAL_TABLET | Freq: Two times a day (BID) | ORAL | Status: DC
  Administered 2020-01-21 – 2020-01-22 (×2): 6.25 mg via ORAL
  Filled 2020-01-21 (×2): qty 1

## 2020-01-21 MED ORDER — SODIUM CHLORIDE 0.9% FLUSH
10.0000 mL | INTRAVENOUS | Status: DC | PRN
  Administered 2020-01-22: 10 mL

## 2020-01-21 NOTE — Progress Notes (Signed)
PROGRESS NOTE    Randall Vincent   IOX:[id number]  DOB: [birth year]  PCP: Elspeth Cho., MD    DOA: 01/10/2020 LOS: 11   Brief Narrative   Randall Vincent is a 79 y.o. male who complains of left knee pain and swelling for the last few days.  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  He has never had problems with the left knee until recently.  He was seen in our office at [facility] earlier this week complaining of pain and swelling.  He was scheduled for a bone scan to evaluate him for prosthetic loosening versus infection by Dr. Odis Luster.  However he developed GI bleeding in the last day or so and has presented to the emergency room for this reason primarily.  Hemoglobin is down to 7.2 from a high of 13.5 earlier this year.  White blood count is 18,100.  Temperature is about 100 F.  The left knee pain and swelling was noted as well as well as a slightly elevated white count and mild fever.  He had cardioversion in June and July and has remained in sinus rhythm he says.  He was switched from Eliquis to Pradaxa at that time and took his last Pradaxa dose yesterday.       Assessment & Plan   Principal Problem:   Melena Active Problems:   CAD, multiple vessel   S/P CABG x 2   Atrial fibrillation status post cardioversion 10/22/19 [facility])   Essential hypertension   DM (diabetes mellitus), type 2 (HCC)   Symptomatic anemia   Acute blood loss anemia   Suspect Septic arthritis (HCC)   Chronic anticoagulation   History of bilateral knee replacement   Chronic kidney disease, stage 3a   Acute on chronic congestive heart failure (HCC)   Elevated troponin   Intestinal bypass or anastomosis status   Diverticulosis of colon without hemorrhage   Septic arthritis of left knee / History of bilateral knee replacement - POA.  History of knee replacement several  years ago presenting with a several week history of worsening pain and swelling of the left knee.   Operative tissue cultures grew Enterococcus faecalis. --Ortho is consulted --Had surgery 8/26, removed L knee prosthesis and placed antibiotic spacer --ID is following --On penicillin infusion --Note: PCN infusion give 1 L/day of fluid. May need to stay on diuretic to offset, if renal function function will tolerate  Acute blood loss anemiasecondary GI bleeding - presented with melena in setting of anticoagulation.   EGD on 8/23 showed possible Barrett's esophagus changes (biopsy contraindicated with bleeding), erosive gastropathy but no signs of recent of active bleeding.   Colonoscopy on 8/25 showed diverticulosis at the colonic anastamosis, patent surgical anastamosis, non-bleeding internal hemorrhoids.  No bleeding source identified. Hbg nadir was 6.4 on 8/21.  Continuing to trend and has been in 7's past few days. --GI consulted - follow up in clinic in 2-4 weeks -Hold pradaxa and all blood thinners for now --d/c IV Protonix (not on PPI at home, so will not start at this time, consider in outpatient setting if indicated) -Serial H&H's q8h --Transfuse if Hbg < 7.0 or active bleeding -Monitor closely. Has been hemodynamically stable.   --Page GI if active bleeding or unstable.   Mild acute on chronic systolic CHF - Resolved.  Presented with mildshortness of breathabove baseline, pulmonary  vascular congestion on chest x-ray, BNP elevated at 400, all consistent with decompensated CHF (but SOB could be due to anemia as well).  No prior echo's at time of admission. Echo on 8/21 showed EF 50-55%, diastolic parameters not evaluated.   -Needs beta blocker if BP allows --Resume ramipril and Aldactone --Started on low dose Coreg, titrate up as BP allows  --Monitor for recurrent edema --Monitor BP, electrolytes and renal function closely --Not on diuretic at home, have been using Lasix  intermittently for edema as needed --8/31: started PO Lasix 20 mg daily due to edema with PCN infusion   Elevated troponin - POA, secondary to demand ischemia in setting of anemia and renal failure reducing clearance.   --Monitor for chest pain and get stat EKG and troponin as needed.  CAD, multiple vessel, S/P CABG x 2 - stable.   -Holding aspirin and metoprolol for now  --Continue statin, aspirin held, continue diltiazem as BP tolerates --Hold Aldactone due to renal failure  Atrial fibrillation status post cardioversion 10/22/19.  Currently in sinus rhythm.  Recently switched from Pradaxa to Eliquis on 12/18/2019 due to concern for renal function.  Pradaxa discontinued for now due to GI bleeding.  Essential hypertension - chronic.  Continued on home Diltiazem for A-fib.  Resume ramipril and Aldactione which were on hold due to AKI and low BP which has resolved.  Monitor BP closely.  Type 2 Diabetes - well controlled, A1C is 6.7%.   -CBG's every 4 while n.p.o. with sliding scale coverage. --Home regimen is weekly Ozempic only (takes injection on Thursdays).  --Metformin previously discontinued due to recent renal insufficiency.  Acute kidney injury - POA, resolved.  Likely pre-renal in setting of bleeding and hypotension.  Avoid nephrotoxins and hypotension.  Renally dose meds as indicated.   GFR at baseline.  Obesity: Body mass index is 46.56 kg/m.  Complicates overall care and prognosis.  DVT prophylaxis: SCDs Start: 01/18/20 1319 SCDs Start: 01/10/20 2307   Diet:  Diet Orders (From admission, onward)    Start     Ordered   01/16/20 1620  Diet regular Room service appropriate? Yes; Fluid consistency: Thin  Diet effective now       Question Answer Comment  Room service appropriate? Yes   Fluid consistency: Thin      01/16/20 1619            Code Status: Full Code    Subjective 01/21/20    Patient seen at bedside this AM.  Still no BM, he says suppositories work  well, but he is hesitant due to being immobile and will have urgency.  Reports left ankle discomfort from brace, better with dressing placed.  Getting PICC line, IV team in room ready to get started.  No other acute complaints.   Disposition Plan & Communication   Status is: Inpatient  Remains inpatient appropriate because: awaiting insurance auth for SNF.        Dispo: The patient is from: Home              Anticipated d/c is to: Home              Anticipated d/c date is: 1 day              Patient currently is not medically stable to d/c.    Family Communication: none at bedside during encounter, son updated on rounds at bedside 8.27. Will attempt to call.   Consults, Procedures, Significant Events   Consultants:  Orthopedics  Infectious Disease  GI  Procedures:   EGD  Colonoscopy   Objective   Vitals:   01/20/20 2004 01/21/20 0442 01/21/20 0732 01/21/20 1159  BP: (!) 157/56 (!) 147/56 (!) 148/48 (!) 182/68  Pulse: 69 63 64 68  Resp: 20 20 18 18   Temp: 98 F (36.7 C) 98 F (36.7 C) 97.9 F (36.6 C) 97.9 F (36.6 C)  TempSrc: Oral  Oral Oral  SpO2: 100% 99% 100% 97%  Weight:  (!) 138.9 kg    Height:        Intake/Output Summary (Last 24 hours) at 01/21/2020 1543 Last data filed at 01/21/2020 1345 Gross per 24 hour  Intake 1626.92 ml  Output 1300 ml  Net 326.92 ml   Filed Weights   01/19/20 0445 01/20/20 0414 01/21/20 0442  Weight: 128 kg 129.3 kg (!) 138.9 kg    Physical Exam:  General exam: awake, alert, no acute distress, obese Respiratory system: CTAB diminished bases, normal respiratory effort. Cardiovascular system: normal S1/S2, RRR Gastrointestinal system: soft, NT, ND, positive bowel sounds Central nervous system: A&O x4. no gross focal neurologic deficits, normal speech Extremities: mild B/L upper extremity edema recurrent, LLE in brace, RLE edema has increased slightly Psychiatry: normal mood, congruent affect, judgement and insight  appear normal  Labs   Data Reviewed: I have personally reviewed following labs and imaging studies  CBC: Recent Labs  Lab 01/17/20 0108 01/17/20 1010 01/18/20 0156 01/18/20 1022 01/19/20 0203 01/19/20 1007 01/20/20 0223 01/20/20 1107 01/20/20 1745 01/21/20 0352 01/21/20 0531  WBC 13.4*  --  13.0*  --  12.4*  --  12.0*  --   --  10.1  --   HGB 8.3*   < > 7.4*   < > 7.3*  7.2*   < > 7.3* 7.4* 7.5* 7.0* 7.5*  HCT 24.9*   < > 22.9*   < > 22.5*  22.1*   < > 23.3* 23.7* 23.1* 22.5* 23.9*  MCV 83.6  --  83.6  --  82.7  --  86.6  --   --  87.5  --   PLT 314  --  334  --  336  --  378  --   --  407*  --    < > = values in this interval not displayed.   Basic Metabolic Panel: Recent Labs  Lab 01/17/20 0108 01/17/20 0108 01/18/20 0156 01/18/20 1022 01/19/20 0203 01/20/20 0223 01/21/20 0352  NA 136   < > 135 135 134* 137 138  K 4.2   < > 4.3 3.6 3.3* 4.1 4.0  CL 105   < > 104 105 103 108 106  CO2 24   < > 24 20* 22 24 26   GLUCOSE 242*   < > 159* 165* 173* 167* 147*  BUN 14   < > 11 11 12 16 14   CREATININE 1.16   < > 1.41* 1.26* 1.26* 1.41* 1.20  CALCIUM 7.8*   < > 7.5* 7.8* 7.5* 7.5* 7.7*  MG 2.0  --  1.7  --  1.8 1.8 2.2   < > = values in this interval not displayed.   GFR: Estimated Creatinine Clearance: 69.3 mL/min (by C-G formula based on SCr of 1.2 mg/dL). Liver Function Tests: No results for input(s): AST, ALT, ALKPHOS, BILITOT, PROT, ALBUMIN in the last 168 hours. No results for input(s): LIPASE, AMYLASE in the last 168 hours. No results for input(s): AMMONIA in the last 168 hours. Coagulation Profile:  No results for input(s): INR, PROTIME in the last 168 hours. Cardiac Enzymes: No results for input(s): CKTOTAL, CKMB, CKMBINDEX, TROPONINI in the last 168 hours. BNP (last 3 results) No results for input(s): PROBNP in the last 8760 hours. HbA1C: No results for input(s): HGBA1C in the last 72 hours. CBG: Recent Labs  Lab 01/20/20 2010 01/20/20 2348  01/21/20 0439 01/21/20 0732 01/21/20 1158  GLUCAP 142* 156* 128* 133* 147*   Lipid Profile: No results for input(s): CHOL, HDL, LDLCALC, TRIG, CHOLHDL, LDLDIRECT in the last 72 hours. Thyroid Function Tests: No results for input(s): TSH, T4TOTAL, FREET4, T3FREE, THYROIDAB in the last 72 hours. Anemia Panel: No results for input(s): VITAMINB12, FOLATE, FERRITIN, TIBC, IRON, RETICCTPCT in the last 72 hours. Sepsis Labs: No results for input(s): PROCALCITON, LATICACIDVEN in the last 168 hours.  Recent Results (from the past 240 hour(s))  Culture, body fluid-bottle     Status: Abnormal   Collection Time: 01/11/20  6:07 PM   Specimen: Fluid  Result Value Ref Range Status   Specimen Description FLUID KNEE  Final   Special Requests   Final    BOTTLES DRAWN AEROBIC AND ANAEROBIC Blood Culture adequate volume   Gram Stain   Final    GRAM POSITIVE COCCI IN CLUSTERS ANAEROBIC BOTTLE ONLY CRITICAL RESULT CALLED TO, READ BACK BY AND VERIFIED WITH: RN Sheria LangAMERON [id number] FCP NO ORGANISMS SEEN AEROBIC BOTTLE ONLY Performed at [facility], [address] [place], [place] [address]    Culture ENTEROCOCCUS FAECALIS (A)  Final   Report Status 01/15/2020 FINAL  Final   Organism ID, Bacteria ENTEROCOCCUS FAECALIS  Final      Susceptibility   Enterococcus faecalis - MIC*    AMPICILLIN <=2 SENSITIVE Sensitive     VANCOMYCIN 1 SENSITIVE Sensitive     GENTAMICIN SYNERGY SENSITIVE Sensitive     * ENTEROCOCCUS FAECALIS  Gram stain     Status: None   Collection Time: 01/11/20  6:07 PM   Specimen: Fluid  Result Value Ref Range Status   Specimen Description FLUID KNEE  Final   Special Requests NONE  Final   Gram Stain   Final    FEW WBC PRESENT,BOTH PMN AND MONONUCLEAR NO ORGANISMS SEEN Performed at [facility], [address] [place], [place] [address]    Report Status 01/12/2020 FINAL  Final  Aerobic/Anaerobic Culture (surgical/deep wound)     Status: Abnormal    Collection Time: 01/16/20 12:41 PM   Specimen: PATH Cytology Misc. fluid; Body Fluid  Result Value Ref Range Status   Specimen Description TISSUE  Final   Special Requests LEFT KNEE TISSUE  Final   Gram Stain   Final    FEW WBC PRESENT,BOTH PMN AND MONONUCLEAR NO ORGANISMS SEEN    Culture (A)  Final    ENTEROCOCCUS FAECALIS NO ANAEROBES ISOLATED CRITICAL RESULT CALLED TO, READ BACK BY AND VERIFIED WITH: RN B.ALAJL AT 1311 ON 01/20/20 BY T.SAAD Performed at [facility], [address] [place], [place] [address]    Report Status 01/21/2020 FINAL  Final   Organism ID, Bacteria ENTEROCOCCUS FAECALIS  Final      Susceptibility   Enterococcus faecalis - MIC*    AMPICILLIN <=2 SENSITIVE Sensitive     VANCOMYCIN 1 SENSITIVE Sensitive     GENTAMICIN SYNERGY RESISTANT Resistant     * ENTEROCOCCUS FAECALIS      Imaging Studies   US EKG SITE RITE  Result Date: 01/20/2020 If Site  Rite image not attached, placement could not be confirmed due to current cardiac rhythm.    Medications   Scheduled Meds: . sodium chloride   Intravenous Once  . amiodarone  200 mg Oral Daily  . atorvastatin  40 mg Oral Daily  . Chlorhexidine Gluconate Cloth  6 each Topical Daily  . diltiazem  60 mg Oral Q8H  . docusate sodium  100 mg Oral BID  . insulin aspart  0-15 Units Subcutaneous Q4H  . polyethylene glycol  17 g Oral Daily  . ramipril  20 mg Oral Daily  . spironolactone  25 mg Oral Daily  . vitamin B-12  1,000 mcg Oral Daily   Continuous Infusions: . sodium chloride Stopped (01/20/20 1535)  . penicillin g continuous IV infusion 12 Million Units (01/21/20 3295)       LOS: 11 days    Time spent: 25 minutes    Pennie Banter, DO [facility]  01/21/2020, 3:43 PM    If 7PM-7AM, please contact night-coverage. How to contact the [facility] Attending or Consulting provider 7A - 7P or covering provider during after hours 7P -7A, for this patient?    1. Check the care team in  [facility] and look for a) attending/consulting TR[facility] provider listed and b) the [facility] team listed 2. Log into [email or web address] and use [facility] universal password to access. If you do not have the password, please contact the hospital operator. 3. Locate the [facility] provider you are looking for under [facility] and page to a number that you can be directly reached. 4. If you still have difficulty reaching the provider, please page the [facility] (Director on Call) for the Hospitalists listed on amion for assistance.

## 2020-01-21 NOTE — Progress Notes (Signed)
Physical Therapy Treatment Patient Details Name: Randall Vincent MRN: [id number] DOB: 1940-07-09 Today's Date: 01/21/2020    History of Present Illness 79 y.o. male here with left knee pain and swelling, ultimately septic and needing TKA hardwear removeal and AB spacer placed 8/26.  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  He has not had problems with the left knee until recently.    PT Comments    Pt was sitting EOB with RN in room upon arriving. He agrees to PT session and is cooperative throughout. Reviewed wt bearing restrictions and need to try to maintain NWB LLE while in knee immobilizer. He was able to stand from elevated bed height to RW with +1 assist. 2nd person close SBA for safety and management of lines. He was able to safely pivot on RLE while limiting wt bearing on LLE. Per MD 20 % wt bearing for transfers. Increased time to perform. Pt was repositioned in recliner with breakfast tray placed in front, polar care applied, call bell in reach and chair alarm set. PT continues to recommend DC to SNF when medically stable.    Follow Up Recommendations  SNF     Equipment Recommendations  None recommended by PT    Recommendations for Other Services       Precautions / Restrictions Precautions Precautions: Fall Precaution Comments: High Fall Required Braces or Orthoses: Knee Immobilizer - Left Restrictions Weight Bearing Restrictions: Yes LLE Weight Bearing: Non weight bearing (allowed 20 % during transfers)    Mobility  Bed Mobility Overal bed mobility: Needs Assistance Bed Mobility: Sit to Supine       Sit to supine: Max assist   General bed mobility comments: pt required max assist to progress BLEs back into bed form short sit EOB  Transfers Overall transfer level: Needs assistance Equipment used: Rolling walker (2 wheeled) Transfers: Sit  to/from Stand Sit to Stand: From elevated surface;+2 safety/equipment;+2 physical assistance         General transfer comment: Less assistance standing from elevated bed height however +2 for safety. max assist of one from bed. +2 mod assist to stand from lower recliner height.  Ambulation/Gait             General Gait Details: pt unable to ambulate 2/2 to wt bearing restrictions. was able to pivot on RLE while attempting to maintain NWB.    Stairs             Wheelchair Mobility    Modified Rankin (Stroke Patients Only)       Balance Overall balance assessment: Needs assistance Sitting-balance support: Feet supported Sitting balance-Leahy Scale: Good Sitting balance - Comments: pt was I'ly seated EOB without supervision or assistance   Standing balance support: During functional activity;Bilateral upper extremity supported Standing balance-Leahy Scale: Poor Standing balance comment: highly reliant on the walker, unable to attain fully upright but given WBing status was relatively safe and stable                            Cognition Arousal/Alertness: Awake/alert Behavior During Therapy: WFL for tasks assessed/performed Overall Cognitive Status: Within Functional Limits for tasks assessed                                 General  Comments: Pt is A and O x 4 and agreeable and pleasant throughout      Exercises      General Comments        Pertinent Vitals/Pain Pain Assessment: No/denies pain Pain Location: L knee with movement Pain Descriptors / Indicators: Aching;Sore;Grimacing;Guarding Pain Intervention(s): Limited activity within patient's tolerance;Monitored during session;Premedicated before session;Repositioned;Ice applied    Home Living                      Prior Function            PT Goals (current goals can now be found in the care plan section) Acute Rehab PT Goals Patient Stated Goal: To get moving so  I can eventually go home." Progress towards PT goals: Progressing toward goals    Frequency    7X/week      PT Plan Current plan remains appropriate    Co-evaluation     PT goals addressed during session: Mobility/safety with mobility;Strengthening/ROM;Proper use of DME;Balance        AM-PAC PT "6 Clicks" Mobility   Outcome Measure  Help needed turning from your back to your side while in a flat bed without using bedrails?: A Lot Help needed moving from lying on your back to sitting on the side of a flat bed without using bedrails?: A Lot Help needed moving to and from a bed to a chair (including a wheelchair)?: A Lot Help needed standing up from a chair using your arms (e.g., wheelchair or bedside chair)?: A Lot Help needed to walk in hospital room?: A Lot Help needed climbing 3-5 steps with a railing? : Total 6 Click Score: 11    End of Session Equipment Utilized During Treatment: Gait belt Activity Tolerance: Patient tolerated treatment well;Patient limited by fatigue;Patient limited by pain Patient left: in chair;with call bell/phone within reach;with chair alarm set Nurse Communication: Mobility status PT Visit Diagnosis: Muscle weakness (generalized) (M62.81);Other abnormalities of gait and mobility (R26.89);Difficulty in walking, not elsewhere classified (R26.2);Pain Pain - Right/Left: Left Pain - part of body: Knee     Time: 9833-8250 PT Time Calculation (min) (ACUTE ONLY): 30 min  Charges:  $Therapeutic Activity: 23-37 mins                     Jetta Lout PTA 01/21/20, 12:29 PM

## 2020-01-21 NOTE — TOC Progression Note (Signed)
Transition of Care [facility]) - Progression Note    Patient Details  Name: Randall Vincent MRN: [id number] Date of Birth: 02/28/1941  [facility] CM/SW Contact  Shawn Route, RN Phone Number: 01/21/2020, 2:42 PM  Clinical Narrative:     In preparation for possible Wednesday discharge, Bed accepted at [facility] and auth started.  Covid test being ordered by attending.     Barriers to Discharge: Continued Medical Work up  Expected Discharge Plan and Services                                                 Social Determinants of Health (SDOH) Interventions    Readmission Risk Interventions No flowsheet data found.

## 2020-01-21 NOTE — Progress Notes (Signed)
Peripherally Inserted Central Catheter Placement  The IV Nurse has discussed with the patient and/or persons authorized to consent for the patient, the purpose of this procedure and the potential benefits and risks involved with this procedure.  The benefits include less needle sticks, lab draws from the catheter, and the patient may be discharged home with the catheter. Risks include, but not limited to, infection, bleeding, blood clot (thrombus formation), and puncture of an artery; nerve damage and irregular heartbeat and possibility to perform a PICC exchange if needed/ordered by physician.  Alternatives to this procedure were also discussed.  Bard Power PICC patient education guide, fact sheet on infection prevention and patient information card has been provided to patient /or left at bedside.    PICC Placement Documentation  PICC Single Lumen 01/21/20 PICC Right Brachial 41 cm 0 cm (Active)  Indication for Insertion or Continuance of Line Home intravenous therapies (PICC only) 01/21/20 1142  Exposed Catheter (cm) 0 cm 01/21/20 1142  Site Assessment Clean;Dry;Intact 01/21/20 1142  Line Status Flushed;Blood return noted;Saline locked 01/21/20 1142  Dressing Type Transparent 01/21/20 1142  Dressing Status Intact;Dry;Clean;Antimicrobial disc in place 01/21/20 1142  Dressing Change Due 01/28/20 01/21/20 1142       Audrie Gallus 01/21/2020, 11:45 AM

## 2020-01-21 NOTE — Progress Notes (Signed)
Physical Therapy Treatment Patient Details Name: Randall Vincent MRN: [id number] DOB: [birth year] Today's Date: 01/21/2020    History of Present Illness 79 y.o. male here with left knee pain and swelling, ultimately septic and needing TKA hardwear removeal and AB spacer placed 8/26.  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  He has not had problems with the left knee until recently.    PT Comments    Therapist contacted by RN requesting pt return to bed for PICC line placement. +2 mod assist to stand from lower recliner height and return to seated EOB. Once pt is in standing, only requires min assist of one to pivot with max vcs for posture correction and limiting LLE wt bearing. Pt tolerated well but does fatigue quickly. Max assist to progress BLEs into bed and +2 assist to reposition to [facility]. Overall pt tolerated OOB activity well but limited time spent up OOB. Acute PT will continue to progress pt as able per POC. Recommend SNF at DC to address deficits and improve safe functional mobility.     Follow Up Recommendations  SNF     Equipment Recommendations  None recommended by PT    Recommendations for Other Services       Precautions / Restrictions Precautions Precautions: Fall Precaution Comments: High Fall Required Braces or Orthoses: Knee Immobilizer - Left Restrictions Weight Bearing Restrictions: Yes LLE Weight Bearing: Non weight bearing (20 % wt bearing for transfers)    Mobility  Bed Mobility Overal bed mobility: Needs Assistance Bed Mobility: Sit to Supine       Sit to supine: Max assist;+2 for safety/equipment   General bed mobility comments: Pt required Max assist with 2nd persopn close for safety to achieve supine and be repositioned to [facility].  Transfers Overall transfer level: Needs assistance Equipment used: Rolling walker (2  wheeled) Transfers: Sit to/from Stand Sit to Stand: +2 safety/equipment;+2 physical assistance;Mod assist         General transfer comment: +2 mod assist to stand from lower recliner height with vcs for fwd wt shift and overall technique. MIn assist to pivot to EOB once in standing with max vcs for proper wt bearing and upright posture correction.  Ambulation/Gait             General Gait Details: pt unable to ambulate 2/2 to wt bearing restrictions. was able to pivot on RLE while attempting to maintain NWB.    Stairs             Wheelchair Mobility    Modified Rankin (Stroke Patients Only)       Balance Overall balance assessment: Needs assistance Sitting-balance support: Feet supported Sitting balance-Leahy Scale: Good Sitting balance - Comments: pt was I'ly seated EOB without supervision or assistance   Standing balance support: During functional activity;Bilateral upper extremity supported Standing balance-Leahy Scale: Poor Standing balance comment: highly reliant on the walker, unable to attain fully upright but given WBing status was relatively safe and stable                            Cognition Arousal/Alertness: Awake/alert Behavior During Therapy: WFL for tasks assessed/performed Overall Cognitive Status: Within Functional Limits for tasks assessed  General Comments: Pt is A and O x 4 and agreeable and pleasant throughout      Exercises      General Comments        Pertinent Vitals/Pain Pain Assessment: 0-10 Pain Score: 6  Faces Pain Scale: Hurts a little bit Pain Location: LLE with movements Pain Descriptors / Indicators: Aching;Sore;Grimacing;Guarding Pain Intervention(s): Limited activity within patient's tolerance;Monitored during session;Premedicated before session;Repositioned;Ice applied    Home Living                      Prior Function            PT Goals  (current goals can now be found in the care plan section) Acute Rehab PT Goals Patient Stated Goal: go home Progress towards PT goals: Progressing toward goals    Frequency    7X/week      PT Plan Current plan remains appropriate    Co-evaluation     PT goals addressed during session: Mobility/safety with mobility        AM-PAC PT "6 Clicks" Mobility   Outcome Measure  Help needed turning from your back to your side while in a flat bed without using bedrails?: A Lot Help needed moving from lying on your back to sitting on the side of a flat bed without using bedrails?: A Lot Help needed moving to and from a bed to a chair (including a wheelchair)?: A Lot Help needed standing up from a chair using your arms (e.g., wheelchair or bedside chair)?: A Lot Help needed to walk in hospital room?: A Lot Help needed climbing 3-5 steps with a railing? : Total 6 Click Score: 11    End of Session Equipment Utilized During Treatment: Gait belt Activity Tolerance: Patient tolerated treatment well;Patient limited by fatigue;Patient limited by pain Patient left: in bed;with call bell/phone within reach;with bed alarm set;with nursing/sitter in room Nurse Communication: Mobility status PT Visit Diagnosis: Muscle weakness (generalized) (M62.81);Other abnormalities of gait and mobility (R26.89);Difficulty in walking, not elsewhere classified (R26.2);Pain Pain - Right/Left: Left Pain - part of body: Knee     Time: 0277-4128 PT Time Calculation (min) (ACUTE ONLY): 9 min  Charges:  $Therapeutic Activity: 8-22 mins                     Jetta Lout PTA 01/21/20, 12:37 PM

## 2020-01-22 LAB — BASIC METABOLIC PANEL
Anion gap: 8 (ref 5–15)
BUN: 16 mg/dL (ref 8–23)
CO2: 25 mmol/L (ref 22–32)
Calcium: 8.1 mg/dL — ABNORMAL LOW (ref 8.9–10.3)
Chloride: 103 mmol/L (ref 98–111)
Creatinine, Ser: 1.32 mg/dL — ABNORMAL HIGH (ref 0.61–1.24)
GFR calc Af Amer: 59 mL/min — ABNORMAL LOW (ref >60)
GFR calc non Af Amer: 51 mL/min — ABNORMAL LOW (ref >60)
Glucose, Bld: 167 mg/dL — ABNORMAL HIGH (ref 70–99)
Potassium: 4.3 mmol/L (ref 3.5–5.1)
Sodium: 136 mmol/L (ref 135–145)

## 2020-01-22 LAB — MAGNESIUM: Magnesium: 1.9 mg/dL (ref 1.7–2.4)

## 2020-01-22 LAB — HEMOGLOBIN AND HEMATOCRIT, BLOOD
HCT: 22.4 % — ABNORMAL LOW (ref 39.0–52.0)
HCT: 27.4 % — ABNORMAL LOW (ref 39.0–52.0)
Hemoglobin: 6.9 g/dL — ABNORMAL LOW (ref 13.0–17.0)
Hemoglobin: 8.8 g/dL — ABNORMAL LOW (ref 13.0–17.0)

## 2020-01-22 LAB — GLUCOSE, CAPILLARY
Glucose-Capillary: 146 mg/dL — ABNORMAL HIGH (ref 70–99)
Glucose-Capillary: 167 mg/dL — ABNORMAL HIGH (ref 70–99)
Glucose-Capillary: 145 mg/dL — ABNORMAL HIGH (ref 70–99)

## 2020-01-22 LAB — PREPARE RBC (CROSSMATCH)

## 2020-01-22 MED ORDER — AMPICILLIN IV (FOR PTA / DISCHARGE USE ONLY): 2.0000 g | INTRAVENOUS | 0 refills | Status: DC

## 2020-01-22 MED ORDER — ASPIRIN EC 81 MG PO TBEC: 81.0000 mg | DELAYED_RELEASE_TABLET | Freq: Every day | ORAL | Status: DC

## 2020-01-22 MED ORDER — TRAMADOL HCL 50 MG PO TABS
50.0000 mg | ORAL_TABLET | Freq: Four times a day (QID) | ORAL | 0 refills | Status: DC | PRN
Start: 2020-01-22 — End: 2020-01-31

## 2020-01-22 MED ORDER — SODIUM CHLORIDE 0.9% IV SOLUTION: Freq: Once | INTRAVENOUS | Status: AC

## 2020-01-22 MED ORDER — POLYETHYLENE GLYCOL 3350 17 G PO PACK: 17.0000 g | PACK | Freq: Every day | ORAL | 0 refills | Status: DC | PRN

## 2020-01-22 MED ORDER — DILTIAZEM HCL ER COATED BEADS 180 MG PO CP24: 180.0000 mg | ORAL_CAPSULE | Freq: Every day | ORAL | Status: DC

## 2020-01-22 MED ORDER — CARVEDILOL 6.25 MG PO TABS: 6.2500 mg | ORAL_TABLET | Freq: Two times a day (BID) | ORAL | Status: DC

## 2020-01-22 MED ORDER — SODIUM CHLORIDE 0.9 % IV SOLN
2.0000 g | INTRAVENOUS | Status: DC
  Administered 2020-01-22: 2 g via INTRAVENOUS
  Filled 2020-01-22 (×4): qty 2000

## 2020-01-22 NOTE — Discharge Summary (Addendum)
Physician Discharge Summary  Randall Vincent UJW:[id number]:[id number] DOB: 01/25/1941 DOA: 01/10/2020  PCP: Elspeth Choerrell, Grace E., MD  Admit date: 01/10/2020 Discharge date: 01/22/2020  Discharge disposition: Skilled nursing facility   Recommendations for Outpatient Follow-Up:   Follow-up with Dr. Odis LusterBowers, orthopedic surgeon, on January 28, 2020 Follow-up with Dr. Maximino Greenlandahiliani, gastroenterologist in 2 weeks Follow-up with physician at the nursing home within 3 days of discharge Monitor kidney function and hemoglobin closely while on Pradaxa   Discharge Diagnosis:   Principal Problem:   Melena Active Problems:   CAD, multiple vessel   S/P CABG x 2   Atrial fibrillation status post cardioversion 10/22/19 [facility](HCC)   Essential hypertension   DM (diabetes mellitus), type 2 (HCC)   Symptomatic anemia   Acute blood loss anemia   Suspect Septic arthritis (HCC)   Chronic anticoagulation   History of bilateral knee replacement   Chronic kidney disease, stage 3a   Acute on chronic congestive heart failure (HCC)   Elevated troponin   Intestinal bypass or anastomosis status   Diverticulosis of colon without hemorrhage    Discharge Condition: Stable.  Diet recommendation:  Diet Order            Diet - low sodium heart healthy           Diet regular Room service appropriate? Yes; Fluid consistency: Thin  Diet effective now                   Code Status: Full Code     Hospital Course:   Randall Vincent is a 79 year old man with medical history significant for CAD status post CABG, CHF, DM 2, HTN, A. fib status post cardioversion on 10/22/2019, on Pradaxa (recently switched from Eliquis to Pradaxa-this was confirmed by patient), CKD stage IIIa, s/p bilateral knee replacement several years ago.  He had seen orthopedic surgeon on 01/07/2020 because of concern for prosthetic loosening versus infection in the left knee.  Routine blood work was ordered and patient was found to have low hemoglobin.  He  also reported dark stools.  He was subsequently sent to the ED for further evaluation.  He was admitted to the hospital for acute blood loss anemia secondary to acute upper GI bleeding/melena.  He was treated with IV fluids.  He was also transfused with 4 units of packed red blood cells.  He was also found to have left septic knee arthritis.  He was seen in consultation by the orthopedic surgeon and the left knee was aspirated.  50 cc of cloudy yellowish fluid was removed and culture showed Enterococcus faecalis.  He was taken to the OR on 01/16/2020 and he underwent total knee revision with explant of femoral, tibial and patellar components, placement of antibiotic articulating spacer.  Culture from left knee obtained at the time of surgery showed Enterococcus faecalis.  He was treated with empiric IV antibiotics.  ID was consulted to assist with management.  ID recommended IV ampicillin 2 g every 4 hours through 02/22/2020.  PICC line was placed in the right arm on 01/21/2020 for long-term IV antibiotics.  He required oral Lasix for acute on chronic diastolic CHF.  He was on Aldactone and ramipril and ramipril has been discontinued at discharge because of concern for CKD.  Kidney function should be monitored closely while on diuretics and adjustments be made accordingly.   He no longer wants to take any anticoagulants for now.  He does not want to continue with Pradaxa.  He said  he has not taken it for about 2 weeks.  He said he had not had any bleeding until he started Pradaxa.  He prefers to take aspirin.  Medical Consultants:    Infectious disease, Dr. Joylene Draft and Dr. Sampson Goon  Orthopedic surgeon, Dr. Odis Luster  Gastroenterologist: Dr. Maximino Greenland   Discharge Exam:    Vitals:   01/22/20 0500 01/22/20 0527 01/22/20 0748 01/22/20 1151  BP: (!) 155/83 (!) 167/64 (!) 169/65 (!) 168/69  Pulse: 78 74 72 69  Resp: Temp: 98.1 F (36.7 C) 98.4 F (36.9 C) 98 F (36.7 C) 97.9 F  (36.6 C)  TempSrc: Oral Oral Oral Oral  SpO2: 100% 100% 99%   Weight:      Height:         GEN: NAD SKIN: Warm and dry.  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  Left leg is immobilized in a splint   The results of significant diagnostics from this hospitalization (including imaging, microbiology, ancillary and laboratory) are listed below for reference.     Procedures and Diagnostic Studies:   DG Knee 1-2 Views Left  Result Date: 01/11/2020 CLINICAL DATA:  Bilateral knee replacements.  Pain and swelling. EXAM: LEFT KNEE - 1-2 VIEW COMPARISON:  None. FINDINGS: Left total knee arthroplasty without perihardware lucency. Intermediate sized suprapatellar joint effusion. No evidence of arthropathy or other focal bone abnormality. Soft tissues are unremarkable. IMPRESSION: 1. Intermediate sized suprapatellar joint effusion. 2. Uncomplicated left total knee arthroplasty. Electronically Signed   By: Deatra Robinson M.D.   On: 01/11/2020 04:34   DG Knee 1-2 Views Right  Result Date: 01/11/2020 CLINICAL DATA:  Knee replacements.  Swelling and pain. EXAM: RIGHT KNEE - 1-2 VIEW COMPARISON:  None. FINDINGS: There is a right total knee arthroplasty. No perihardware lucency. No evidence of arthropathy or other focal bone abnormality. Soft tissues are unremarkable. 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 Electronically Signed   By: Deatra Robinson M.D.   On: 01/11/2020 04:35   ECHOCARDIOGRAM COMPLETE  Result Date: 01/11/2020    ECHOCARDIOGRAM REPORT   Patient Name:   Randall Vincent Date of Exam: 01/11/2020 Medical Rec #:  [id number]     Height:       70.0 in Accession #:    [id number]    Weight:       240.0 lb Date of Birth:  Sep 25, 1940      BSA:          2.255 m Patient Age:    79 years      BP:           134/48 mmHg Patient Gender: M              HR:           74 bpm. Exam Location:  [facility] Procedure: 2D Echo, Cardiac Doppler and Color Doppler Indications:     CHF- acute diastolic 428.31  History:         Patient has no prior history of Echocardiogram examinations.                  Arrythmias:Atrial Fibrillation; Risk Factors:Hypertension,                  Diabetes and Dyslipidemia.  Sonographer:     Cristela Blue RDCS (AE)  Referring Phys:  [id number] Andris Baumann Diagnosing Phys: Lorine Bears MD  Sonographer Comments: No apical window. IMPRESSIONS  1. Left ventricular ejection fraction, by estimation, is 50 to 55%. The left ventricle has low normal function. Left ventricular endocardial border not optimally defined to evaluate regional wall motion. Left ventricular diastolic function could not be evaluated.  2. Right ventricular systolic function is normal. The right ventricular size is normal. Tricuspid regurgitation signal is inadequate for assessing PA pressure.  3. Left atrial size was mildly dilated.  4. The mitral valve is normal in structure. No evidence of mitral valve regurgitation. No evidence of mitral stenosis.  5. The aortic valve is normal in structure. Aortic valve regurgitation is not visualized. Mild aortic valve sclerosis is present, with no evidence of aortic valve stenosis.  6. Mildly dilated pulmonary artery.  7. The inferior vena cava is normal in size with greater than 50% respiratory variability, suggesting right atrial pressure of 3 mmHg. FINDINGS  Left Ventricle: Left ventricular ejection fraction, by estimation, is 50 to 55%. The left ventricle has low normal function. Left ventricular endocardial border not optimally defined to evaluate regional wall motion. The left ventricular internal cavity  size was normal in size. There is borderline left ventricular hypertrophy. Left ventricular diastolic function could not be evaluated. Right Ventricle: The right ventricular size is normal. No increase in right ventricular wall thickness.  Right ventricular systolic function is normal. Tricuspid regurgitation signal is inadequate for assessing PA pressure. The tricuspid regurgitant velocity is 2.67 m/s, and with an assumed right atrial pressure of 5 mmHg, the estimated right ventricular systolic pressure is 33.5 mmHg. Left Atrium: Left atrial size was mildly dilated. Right Atrium: Right atrial size was normal in size. Pericardium: There is no evidence of pericardial effusion. Mitral Valve: The mitral valve is normal in structure. Normal mobility of the mitral valve leaflets. Mild mitral annular calcification. No evidence of mitral valve regurgitation. No evidence of mitral valve stenosis. Tricuspid Valve: The tricuspid valve is normal in structure. Tricuspid valve regurgitation is trivial. No evidence of tricuspid stenosis. Aortic Valve: The aortic valve is normal in structure. Aortic valve regurgitation is not visualized. Mild aortic valve sclerosis is present, with no evidence of aortic valve stenosis. Pulmonic Valve: The pulmonic valve was normal in structure. Pulmonic valve regurgitation is trivial. No evidence of pulmonic stenosis. Aorta: The aortic root is normal in size and structure. Pulmonary Artery: The pulmonary artery is mildly dilated. Venous: The inferior vena cava is normal in size with greater than 50% respiratory variability, suggesting right atrial pressure of 3 mmHg. IAS/Shunts: No atrial level shunt detected by color flow Doppler.  LEFT VENTRICLE PLAX 2D LVIDd:         5.30 cm LVIDs:         3.95 cm LV PW:         0.68 cm LV IVS:        1.77 cm LVOT diam:     2.00 cm LVOT Area:     3.14 cm  LEFT ATRIUM         Index LA diam:    4.70 cm 2.08 cm/m                        PULMONIC VALVE AORTA                 PV Vmax:        0.73 m/s Ao Root diam: 3.20 cm PV  Peak grad:   2.1 mmHg                       RVOT Peak grad: 7 mmHg  TRICUSPID VALVE TR Peak grad:   28.5 mmHg TR Vmax:        267.00 cm/s  SHUNTS Systemic Diam: 2.00 cm Lorine Bears MD Electronically signed by Lorine Bears MD Signature Date/Time: 01/11/2020/5:11:53 PM    Final      Labs:   Basic Metabolic Panel: Recent Labs  Lab 01/18/20 0156 01/18/20 0156 01/18/20 1022 01/18/20 1022 01/19/20 0203 01/19/20 0203 01/20/20 0223 01/20/20 0223 01/21/20 0352 01/22/20 0350  NA 135   < > 135  --  134*  --  137  --  138 136  K 4.3   < > 3.6   < > 3.3*   < > 4.1   < > 4.0 4.3  CL 104   < > 105  --  103  --  108  --  106 103  CO2 24   < > 20*  --  22  --  24  --  26 25  GLUCOSE 159*   < > 165*  --  173*  --  167*  --  147* 167*  BUN 11   < > 11  --  12  --  16  --  14 16  CREATININE 1.41*   < > 1.26*  --  1.26*  --  1.41*  --  1.20 1.32*  CALCIUM 7.5*   < > 7.8*  --  7.5*  --  7.5*  --  7.7* 8.1*  MG 1.7  --   --   --  1.8  --  1.8  --  2.2 1.9   < > = values in this interval not displayed.   GFR Estimated Creatinine Clearance: 63 mL/min (A) (by C-G formula based on SCr of 1.32 mg/dL (H)). Liver Function Tests: No results for input(s): AST, ALT, ALKPHOS, BILITOT, PROT, ALBUMIN in the last 168 hours. No results for input(s): LIPASE, AMYLASE in the last 168 hours. No results for input(s): AMMONIA in the last 168 hours. Coagulation profile No results for input(s): INR, PROTIME in the last 168 hours.  CBC: Recent Labs  Lab 01/17/20 0108 01/17/20 1010 01/18/20 0156 01/18/20 1022 01/19/20 0203 01/19/20 1007 01/20/20 0223 01/20/20 1107 01/21/20 0352 01/21/20 0531 01/21/20 1814 01/22/20 0205 01/22/20 1010  WBC 13.4*  --  13.0*  --  12.4*  --  12.0*  --  10.1  --   --   --   --   HGB 8.3*   < > 7.4*   < > 7.3*  7.2*   < > 7.3*   < > 7.0* 7.5* 7.3* 6.9* 8.8*  HCT 24.9*   < > 22.9*   < > 22.5*  22.1*   < > 23.3*   < > 22.5* 23.9* 22.9* 22.4* 27.4*  MCV 83.6  --  83.6  --  82.7  --  86.6  --  87.5  --   --   --   --   PLT 314  --  334  --  336  --  378  --  407*  --   --   --   --    < > = values in this interval not displayed.   Cardiac  Enzymes: No results for input(s): CKTOTAL, CKMB, CKMBINDEX, TROPONINI in the last 168 hours.  BNP: Invalid input(s): POCBNP CBG: Recent Labs  Lab 01/21/20 2058 01/21/20 2357 01/22/20 0459 01/22/20 0750 01/22/20 1150  GLUCAP 190* 169* 146* 167* 145*   D-Dimer No results for input(s): DDIMER in the last 72 hours. Hgb A1c No results for input(s): HGBA1C in the last 72 hours. Lipid Profile No results for input(s): CHOL, HDL, LDLCALC, TRIG, CHOLHDL, LDLDIRECT in the last 72 hours. Thyroid function studies No results for input(s): TSH, T4TOTAL, T3FREE, THYROIDAB in the last 72 hours.  Invalid input(s): FREET3 Anemia work up No results for input(s): VITAMINB12, FOLATE, FERRITIN, TIBC, IRON, RETICCTPCT in the last 72 hours. Microbiology Recent Results (from the past 240 hour(s))  Aerobic/Anaerobic Culture (surgical/deep wound)     Status: Abnormal   Collection Time: 01/16/20 12:41 PM   Specimen: PATH Cytology Misc. fluid; Body Fluid  Result Value Ref Range Status   Specimen Description TISSUE  Final   Special Requests LEFT KNEE TISSUE  Final   Gram Stain   Final    FEW WBC PRESENT,BOTH PMN AND MONONUCLEAR NO ORGANISMS SEEN    Culture (A)  Final    ENTEROCOCCUS FAECALIS NO ANAEROBES ISOLATED CRITICAL RESULT CALLED TO, READ BACK BY AND VERIFIED WITH: RN [email or web address] AT 1311 ON 01/20/20 BY T.SAAD Performed at [facility], [address] [place], [place] [address]    Report Status 01/21/2020 FINAL  Final   Organism ID, Bacteria ENTEROCOCCUS FAECALIS  Final      Susceptibility   Enterococcus faecalis - MIC*    AMPICILLIN <=2 SENSITIVE Sensitive     VANCOMYCIN 1 SENSITIVE Sensitive     GENTAMICIN SYNERGY RESISTANT Resistant     * ENTEROCOCCUS FAECALIS  Respiratory Panel by RT PCR (Flu A&B, Covid) - Nasopharyngeal Swab     Status: None   Collection Time: 01/21/20  5:14 PM   Specimen: Nasopharyngeal Swab  Result Value Ref Range Status   SARS Coronavirus 2 by RT PCR NEGATIVE  NEGATIVE Final    Comment: (NOTE) SARS-CoV-2 target nucleic acids are NOT DETECTED.  The SARS-CoV-2 RNA is generally detectable in upper respiratoy specimens during the acute phase of infection. The lowest concentration of SARS-CoV-2 viral copies this assay can detect is 131 copies/mL. A negative result does not preclude SARS-Cov-2 infection and should not be used as the sole basis for treatment or other patient management decisions. A negative result may occur with  improper specimen collection/handling, submission of specimen other than nasopharyngeal swab, presence of viral mutation(s) within the areas targeted by this assay, and inadequate number of viral copies (<131 copies/mL). A negative result must be combined with clinical observations, patient history, and epidemiological information. The expected result is Negative.  Fact Sheet for Patients:  https://www.moore.com/  Fact Sheet for Healthcare Providers:  https://www.young.biz/  This test is no t yet approved or cleared by the [place] FDA and  has been authorized for detection and/or diagnosis of SARS-CoV-2 by FDA under an Emergency Use Authorization (EUA). This EUA will remain  in effect (meaning this test can be used) for the duration of the COVID-19 declaration under Section 564(b)(1) of the Act, 21 U.S.C. section 360bbb-3(b)(1), unless the authorization is terminated or revoked sooner.     Influenza A by PCR NEGATIVE NEGATIVE Final   Influenza B by PCR NEGATIVE NEGATIVE Final    Comment: (NOTE) The Xpert Xpress SARS-CoV-2/FLU/RSV assay is intended as an aid in  the diagnosis of influenza from Nasopharyngeal swab specimens and  should not be used as a sole  basis for treatment. Nasal washings and  aspirates are unacceptable for Xpert Xpress SARS-CoV-2/FLU/RSV  testing.  Fact Sheet for Patients: https://www.moore.com/  Fact Sheet for Healthcare  Providers: https://www.young.biz/  This test is not yet approved or cleared by the [place] FDA and  has been authorized for detection and/or diagnosis of SARS-CoV-2 by  FDA under an Emergency Use Authorization (EUA). This EUA will remain  in effect (meaning this test can be used) for the duration of the  Covid-19 declaration under Section 564(b)(1) of the Act, 21  U.S.C. section 360bbb-3(b)(1), unless the authorization is  terminated or revoked. Performed at [facility], [address], [place], [place] [address]      Discharge Instructions:   Discharge Instructions    Diet - low sodium heart healthy   Complete by: As directed    Discharge wound care:   Complete by: As directed    Keep wounds clean and dry   Home infusion instructions   Complete by: As directed    Instructions: Flushing of vascular access device: 0.9% NaCl pre/post medication administration and prn patency; Heparin 100 u/ml, 2ml for implanted ports and Heparin 10u/ml, 33ml for all other central venous catheters.   Increase activity slowly   Complete by: As directed      Allergies as of 01/22/2020   No Known Allergies     Medication List    STOP taking these medications   ramipril 10 MG capsule Commonly known as: ALTACE     TAKE these medications   acetaminophen 500 MG tablet Commonly known as: TYLENOL Take 500-1,000 mg by mouth every 6 (six) hours as needed for mild pain or fever.   albuterol 108 (90 Base) MCG/ACT inhaler Commonly known as: VENTOLIN HFA Inhale 2 puffs into the lungs every 6 (six) hours as needed for wheezing.   amiodarone 200 MG tablet Commonly known as: PACERONE Take 200 mg by mouth daily.   ampicillin  IVPB Inject 2 g into the vein every 4 (four) hours. Indication: E. Faecalis PJI of knee Last Day of Therapy:  02/22/2020 Labs - Once weekly:  CBC/D, CRP and CMP,   atorvastatin 40 MG tablet Commonly known as: LIPITOR Take 40 mg by mouth  daily.   carvedilol 6.25 MG tablet Commonly known as: COREG Take 1 tablet (6.25 mg total) by mouth 2 (two) times daily with a meal.   diltiazem 180 MG 24 hr capsule Commonly known as: Cardizem CD Take 1 capsule (180 mg total) by mouth daily.   fluticasone 50 MCG/ACT nasal spray Commonly known as: FLONASE Place 2 sprays into both nostrils daily as needed for allergies or rhinitis.   mupirocin ointment 2 % Commonly known as: BACTROBAN Apply 1 application topically daily as needed (leg sores).   polyethylene glycol 17 g packet Commonly known as: MIRALAX / GLYCOLAX Take 17 g by mouth daily as needed for up to 7 days for mild constipation.   Pradaxa 75 MG Caps capsule Generic drug: dabigatran Take 75 mg by mouth 2 (two) times daily.   Semaglutide (1 MG/DOSE) 2 MG/1.5ML Sopn Inject 1 mg into the skin every Thursday.   sildenafil 100 MG tablet Commonly known as: VIAGRA Take 100 mg by mouth daily as needed for erectile dysfunction.   spironolactone 25 MG tablet Commonly known as: ALDACTONE Take 25 mg by mouth daily.   traMADol 50 MG tablet Commonly known as: ULTRAM Take 1 tablet (50 mg total) by mouth every 6 (six) hours as needed for  up to 5 days for moderate pain.   vitamin B-12 1000 MCG tablet Commonly known as: CYANOCOBALAMIN Take 1,000 mcg by mouth daily.            Home Infusion Instuctions  (From admission, onward)         Start     Ordered   01/22/20 0000  Home infusion instructions       Question:  Instructions  Answer:  Flushing of vascular access device: 0.9% NaCl pre/post medication administration and prn patency; Heparin 100 u/ml, 63ml for implanted ports and Heparin 10u/ml, 42ml for all other central venous catheters.   01/22/20 1201           Discharge Care Instructions  (From admission, onward)         Start     Ordered   01/22/20 0000  Discharge wound care:       Comments: Keep wounds clean and dry   01/22/20 1201          Contact  information for follow-up providers    Pasty Spillers, MD. Schedule an appointment as soon as possible for a visit in 2 week(s).   Specialty: Gastroenterology Contact information: [address] [place] [address] [phone]        Lyndle Herrlich, MD Follow up on 01/28/2020.   Specialty: Orthopedic Surgery Why: Follow-up for left knee surgery Contact information: [address] [place] [address] [phone]            Contact information for after-discharge care    Destination    [facility] Preferred SNF .   Service: Skilled Nursing Contact information: [address] [place] [address] [phone]                   Time coordinating discharge: 38 minutes  Signed:  Lurene Shadow  [facility] 01/22/2020, 12:25 PM   Pager on [email or web address]. If 7PM-7AM, please contact night-coverage at [email or web address]

## 2020-01-22 NOTE — Progress Notes (Signed)
Subjective:  Patient reports pain as mild.    Objective:   VITALS:   Vitals:   01/22/20 0355 01/22/20 0500 01/22/20 0527 01/22/20 0748  BP: (!) 164/65 (!) 155/83 (!) 167/64 (!) 169/65  Pulse: 73 78 74 72  Resp: 18 16 16 19   Temp: 98 F (36.7 C) 98.1 F (36.7 C) 98.4 F (36.9 C) 98 F (36.7 C)  TempSrc:  Oral Oral Oral  SpO2: 100% 100% 100% 99%  Weight:      Height:        PHYSICAL EXAM:  Neurovascular intact Dorsiflexion/Plantar flexion intact Incision: dressing C/D/I Compartment soft  LABS  Results for orders placed or performed during the hospital encounter of 01/10/20 (from the past 24 hour(s))  Glucose, capillary     Status: Abnormal   Collection Time: 01/21/20 11:58 AM  Result Value Ref Range   Glucose-Capillary 147 (H) 70 - 99 mg/dL  Glucose, capillary     Status: Abnormal   Collection Time: 01/21/20  4:35 PM  Result Value Ref Range   Glucose-Capillary 161 (H) 70 - 99 mg/dL  Respiratory Panel by RT PCR (Flu A&B, Covid) - Nasopharyngeal Swab     Status: None   Collection Time: 01/21/20  5:14 PM   Specimen: Nasopharyngeal Swab  Result Value Ref Range   SARS Coronavirus 2 by RT PCR NEGATIVE NEGATIVE   Influenza A by PCR NEGATIVE NEGATIVE   Influenza B by PCR NEGATIVE NEGATIVE  Hemoglobin and hematocrit, blood     Status: Abnormal   Collection Time: 01/21/20  6:14 PM  Result Value Ref Range   Hemoglobin 7.3 (L) 13.0 - 17.0 g/dL   HCT 01/23/20 (L) 39 - 52 %  Glucose, capillary     Status: Abnormal   Collection Time: 01/21/20  8:58 PM  Result Value Ref Range   Glucose-Capillary 190 (H) 70 - 99 mg/dL  Glucose, capillary     Status: Abnormal   Collection Time: 01/21/20 11:57 PM  Result Value Ref Range   Glucose-Capillary 169 (H) 70 - 99 mg/dL  Hemoglobin and hematocrit, blood     Status: Abnormal   Collection Time: 01/22/20  2:05 AM  Result Value Ref Range   Hemoglobin 6.9 (L) 13.0 - 17.0 g/dL   HCT 03/23/20 (L) 39 - 52 %  Prepare RBC (crossmatch)     Status:  None   Collection Time: 01/22/20  2:50 AM  Result Value Ref Range   Order Confirmation      ORDER PROCESSED BY BLOOD BANK Performed at [facility], [address] [place], [place]   Basic metabolic panel     Status: Abnormal   Collection Time: 01/22/20  3:50 AM  Result Value Ref Range   Sodium 136 135 - 145 mmol/L   Potassium 4.3 3.5 - 5.1 mmol/L   Chloride 103 98 - 111 mmol/L   CO2 25 22 - 32 mmol/L   Glucose, Bld 167 (H) 70 - 99 mg/dL   BUN 16 8 - 23 mg/dL   Creatinine, Ser 03/23/20 (H) 0.61 - 1.24 mg/dL   Calcium 8.1 (L) 8.9 - 10.3 mg/dL   GFR calc non Af Amer 51 (L) >60 mL/min   GFR calc Af Amer 59 (L) >60 mL/min   Anion gap 8 5 - 15  Magnesium     Status: None   Collection Time: 01/22/20  3:50 AM  Result Value Ref Range   Magnesium 1.9 1.7 - 2.4 mg/dL  Type and screen  [facility]     Status: None (Preliminary result)   Collection Time: 01/22/20  3:50 AM  Result Value Ref Range   ABO/RH(D) B POS    Antibody Screen NEG    Sample Expiration 01/25/2020,2359    Unit Number [id number]    Blood Component Type RED CELLS,LR    Unit division 00    Status of Unit ISSUED    Transfusion Status OK TO TRANSFUSE    Crossmatch Result      Compatible Performed at [facility], [address] [place], [place] [address]   Glucose, capillary     Status: Abnormal   Collection Time: 01/22/20  4:59 AM  Result Value Ref Range   Glucose-Capillary 146 (H) 70 - 99 mg/dL  Glucose, capillary     Status: Abnormal   Collection Time: 01/22/20  7:50 AM  Result Value Ref Range   Glucose-Capillary 167 (H) 70 - 99 mg/dL    [place] EKG SITE RITE  Result Date: 01/20/2020 If Site Rite image not attached, placement could not be confirmed due to current cardiac rhythm.   Assessment/Plan: 6 Days Post-Op   Principal Problem:   Melena Active Problems:   CAD, multiple vessel   S/P CABG x 2   Atrial fibrillation status post cardioversion 10/22/19  [facility])   Essential hypertension   DM (diabetes mellitus), type 2 (HCC)   Symptomatic anemia   Acute blood loss anemia   Suspect Septic arthritis (HCC)   Chronic anticoagulation   History of bilateral knee replacement   Chronic kidney disease, stage 3a   Acute on chronic congestive heart failure (HCC)   Elevated troponin   Intestinal bypass or anastomosis status   Diverticulosis of colon without hemorrhage  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 He will continue with physical therapy.Continue strict blood sugar control. Follow up in office sept 7 for staple removal. call for appt [phone]  Lyndle Herrlich , MD 01/22/2020, 10:33 AM

## 2020-01-22 NOTE — Progress Notes (Signed)
Physical Therapy Treatment Patient Details Name: Randall Vincent MRN: [id number] DOB: 1940-07-30 Today's Date: 01/22/2020    History of Present Illness 79 y.o. male here with left knee pain and swelling, ultimately septic and needing TKA hardwear removeal and AB spacer placed 8/26.  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  He has not had problems with the left knee until recently.    PT Comments    Pt was I'ly sitting EOB upon arriving. He request to use BSC for BM. Mod assist from elevated bed height to stand and tak steps to [facility]. Constant Vcs for proper wt bearing throughout. Pt struggles with 20 % wt bearing during transfers. Max assist to stand from lower BSC/recliner surface. Pt did have very small loose stool. Pt does become slightly SOB with minimal activity. Elevated RR with exertion to 30 but recovers with extended rest. Pt will benefit form SNF at [place] to address deficits and assist pt to PLOF. Pt was in recliner with call bell in reach, chair alarm in place and call bell in reach. RN aware of pt's abilities and precautions.     Follow Up Recommendations  SNF     Equipment Recommendations  Other (comment) (defer to next level of care)    Recommendations for Other Services       Precautions / Restrictions Precautions Precautions: Fall Precaution Comments: High Fall Required Braces or Orthoses: Knee Immobilizer - Left Knee Immobilizer - Left: On at all times Restrictions Weight Bearing Restrictions: Yes LLE Weight Bearing: Non weight bearing (per MD allowed 20 % wt bearing during transfers)    Mobility  Bed Mobility               General bed mobility comments: Was I'ly sitting EOB upon arriving  Transfers Overall transfer level: Needs assistance Equipment used: Rolling walker (2 wheeled) Transfers: Sit to/from Stand Sit to Stand: From elevated  surface;Mod assist;Max assist         General transfer comment: Mod assist of 1 to stand from elevated bed height. Max assist to stand from BSC/recliner 2/2 to lower surface height.   Ambulation/Gait Ambulation/Gait assistance: Min assist Gait Distance (Feet): 3 Feet Assistive device: Rolling walker (2 wheeled) Gait Pattern/deviations: Step-to pattern;Trunk flexed;Decreased stride length;Shuffle Gait velocity: decreased   General Gait Details: Pt was able to take several steps to EOB to [facility] then to recliner. stood from recliner 1 x to adjust pads. Pt requires constant vcs for decreasing Wt bearing on LLE throughout all standing/transfers    Wheelchair Mobility    Modified Rankin (Stroke Patients Only)       Balance Overall balance assessment: Needs assistance Sitting-balance support: Feet supported Sitting balance-Leahy Scale: Good     Standing balance support: During functional activity;Bilateral upper extremity supported Standing balance-Leahy Scale: Fair Standing balance comment: no LOB however poor ability to maintain proper wt bearing. Heavy reliance on RW for support.        Cognition Arousal/Alertness: Awake/alert Behavior During Therapy: WFL for tasks assessed/performed Overall Cognitive Status: Within Functional Limits for tasks assessed        General Comments: Pt is A and O x 4             Pertinent Vitals/Pain Pain Assessment: 0-10 Pain Score: 3  Faces Pain Scale: Hurts a little bit Pain Location: LLE with movements Pain Descriptors / Indicators: Aching;Sore;Grimacing;Guarding Pain  Intervention(s): Limited activity within patient's tolerance;Monitored during session;Premedicated before session;Repositioned;Ice applied           PT Goals (current goals can now be found in the care plan section) Acute Rehab PT Goals Patient Stated Goal: 'To go to rehab and then home." Progress towards PT goals: Progressing toward goals    Frequency     7X/week      PT Plan Current plan remains appropriate       AM-PAC PT "6 Clicks" Mobility   Outcome Measure  Help needed turning from your back to your side while in a flat bed without using bedrails?: A Lot Help needed moving from lying on your back to sitting on the side of a flat bed without using bedrails?: A Lot Help needed moving to and from a bed to a chair (including a wheelchair)?: A Lot Help needed standing up from a chair using your arms (e.g., wheelchair or bedside chair)?: A Lot Help needed to walk in hospital room?: A Lot Help needed climbing 3-5 steps with a railing? : Total 6 Click Score: 11    End of Session Equipment Utilized During Treatment: Gait belt Activity Tolerance: Patient tolerated treatment well;Patient limited by fatigue Patient left: in chair;with call bell/phone within reach;with chair alarm set Nurse Communication: Mobility status PT Visit Diagnosis: Muscle weakness (generalized) (M62.81);Other abnormalities of gait and mobility (R26.89);Difficulty in walking, not elsewhere classified (R26.2);Pain Pain - Right/Left: Left Pain - part of body: Knee     Time: 7106-2694 PT Time Calculation (min) (ACUTE ONLY): 26 min  Charges:  $Therapeutic Activity: 23-37 mins                     Jetta Lout PTA 01/22/20, 9:30 AM

## 2020-01-22 NOTE — Progress Notes (Signed)
PHARMACY CONSULT NOTE FOR:  OUTPATIENT  PARENTERAL ANTIBIOTIC THERAPY (OPAT)  Indication: E. Faecalis PJI of knee Regimen: Ampicillin 2gm IV q4h (will not be on PCN G continuous infusion as previously planned as SNF will not accept on continuous infusion).  If patient discharged from SNF while still on IV antibiotics, suggest change from ampicillin to avoid q4h administration at home.   End date: 02/22/2020  IV antibiotic discharge orders are pended. To discharging provider:  please sign these orders via discharge navigator,  Select New Orders & click on the button choice - Manage This Unsigned Work.     Thank you for allowing pharmacy to be a part of this patient's care.  Juliette Alcide, PharmD, BCPS.   Work Cell: [phone] 01/22/2020 10:20 AM

## 2020-01-22 NOTE — Progress Notes (Signed)
NP Jon Billings notified of recent H&H.  See new orders.

## 2020-01-22 NOTE — Progress Notes (Signed)
Report called to [place] [facility]

## 2020-01-22 NOTE — Progress Notes (Signed)
Pt has c/o about his care all night long whether it be the last shift, the last person in his room or to this nurse.  Pt care has been frequent and difficult.

## 2020-01-23 LAB — TYPE AND SCREEN
ABO/RH(D): B POS
Antibody Screen: NEGATIVE
Unit division: 0

## 2020-01-23 LAB — BPAM RBC
Blood Product Expiration Date: 202109232359
ISSUE DATE / TIME: 202109010506
Unit Type and Rh: 7300

## 2020-01-24 ENCOUNTER — Telehealth: Payer: Self-pay | Admitting: Gastroenterology

## 2020-01-24 NOTE — Telephone Encounter (Signed)
Kim from [facility] wants to know if patient can be seen sooner than october 5th appt.Clinical staff was informed

## 2020-01-27 ENCOUNTER — Inpatient Hospital Stay
Admission: EM | Admit: 2020-01-27 | Discharge: 2020-02-01 | DRG: 291 | Disposition: A | Discharge status: Skilled Nursing Facility | Payer: Medicare Other | Source: Skilled Nursing Facility | Attending: Internal Medicine | Admitting: Internal Medicine

## 2020-01-27 ENCOUNTER — Encounter: Payer: Self-pay | Admitting: Emergency Medicine

## 2020-01-27 ENCOUNTER — Emergency Department: Payer: Medicare Other

## 2020-01-27 ENCOUNTER — Inpatient Hospital Stay: Payer: Medicare Other

## 2020-01-27 ENCOUNTER — Other Ambulatory Visit: Payer: Self-pay

## 2020-01-27 DIAGNOSIS — E785 Hyperlipidemia, unspecified: Secondary | ICD-10-CM | POA: Diagnosis present

## 2020-01-27 DIAGNOSIS — I480 Paroxysmal atrial fibrillation: Secondary | ICD-10-CM | POA: Diagnosis present

## 2020-01-27 DIAGNOSIS — I2510 Atherosclerotic heart disease of native coronary artery without angina pectoris: Secondary | ICD-10-CM | POA: Diagnosis present

## 2020-01-27 DIAGNOSIS — T8454XA Infection and inflammatory reaction due to internal left knee prosthesis, initial encounter: Secondary | ICD-10-CM | POA: Diagnosis present

## 2020-01-27 DIAGNOSIS — N1831 Chronic kidney disease, stage 3a: Secondary | ICD-10-CM | POA: Diagnosis present

## 2020-01-27 DIAGNOSIS — Z79899 Other long term (current) drug therapy: Secondary | ICD-10-CM | POA: Diagnosis not present

## 2020-01-27 DIAGNOSIS — Z6841 Body Mass Index (BMI) 40.0 and over, adult: Secondary | ICD-10-CM | POA: Diagnosis not present

## 2020-01-27 DIAGNOSIS — Z7982 Long term (current) use of aspirin: Secondary | ICD-10-CM

## 2020-01-27 DIAGNOSIS — Z7901 Long term (current) use of anticoagulants: Secondary | ICD-10-CM

## 2020-01-27 DIAGNOSIS — I130 Hypertensive heart and chronic kidney disease with heart failure and stage 1 through stage 4 chronic kidney disease, or unspecified chronic kidney disease: Principal | ICD-10-CM | POA: Diagnosis present

## 2020-01-27 DIAGNOSIS — Z8719 Personal history of other diseases of the digestive system: Secondary | ICD-10-CM

## 2020-01-27 DIAGNOSIS — Y792 Prosthetic and other implants, materials and accessory orthopedic devices associated with adverse incidents: Secondary | ICD-10-CM | POA: Diagnosis present

## 2020-01-27 DIAGNOSIS — N1832 Chronic kidney disease, stage 3b: Secondary | ICD-10-CM | POA: Diagnosis present

## 2020-01-27 DIAGNOSIS — I493 Ventricular premature depolarization: Secondary | ICD-10-CM | POA: Diagnosis present

## 2020-01-27 DIAGNOSIS — I5033 Acute on chronic diastolic (congestive) heart failure: Secondary | ICD-10-CM | POA: Diagnosis present

## 2020-01-27 DIAGNOSIS — M009 Pyogenic arthritis, unspecified: Secondary | ICD-10-CM

## 2020-01-27 DIAGNOSIS — J9621 Acute and chronic respiratory failure with hypoxia: Secondary | ICD-10-CM | POA: Diagnosis present

## 2020-01-27 DIAGNOSIS — I10 Essential (primary) hypertension: Secondary | ICD-10-CM

## 2020-01-27 DIAGNOSIS — Z951 Presence of aortocoronary bypass graft: Secondary | ICD-10-CM

## 2020-01-27 DIAGNOSIS — I4891 Unspecified atrial fibrillation: Secondary | ICD-10-CM | POA: Diagnosis present

## 2020-01-27 DIAGNOSIS — E119 Type 2 diabetes mellitus without complications: Secondary | ICD-10-CM

## 2020-01-27 DIAGNOSIS — Z20822 Contact with and (suspected) exposure to covid-19: Secondary | ICD-10-CM | POA: Diagnosis present

## 2020-01-27 DIAGNOSIS — E1122 Type 2 diabetes mellitus with diabetic chronic kidney disease: Secondary | ICD-10-CM | POA: Diagnosis present

## 2020-01-27 DIAGNOSIS — M00862 Arthritis due to other bacteria, left knee: Secondary | ICD-10-CM | POA: Diagnosis present

## 2020-01-27 DIAGNOSIS — L89621 Pressure ulcer of left heel, stage 1: Secondary | ICD-10-CM | POA: Diagnosis not present

## 2020-01-27 DIAGNOSIS — N189 Chronic kidney disease, unspecified: Secondary | ICD-10-CM | POA: Diagnosis present

## 2020-01-27 DIAGNOSIS — Z9884 Bariatric surgery status: Secondary | ICD-10-CM

## 2020-01-27 DIAGNOSIS — N179 Acute kidney failure, unspecified: Secondary | ICD-10-CM | POA: Diagnosis present

## 2020-01-27 DIAGNOSIS — Z87891 Personal history of nicotine dependence: Secondary | ICD-10-CM

## 2020-01-27 DIAGNOSIS — L8990 Pressure ulcer of unspecified site, unspecified stage: Secondary | ICD-10-CM | POA: Insufficient documentation

## 2020-01-27 DIAGNOSIS — I509 Heart failure, unspecified: Secondary | ICD-10-CM

## 2020-01-27 DIAGNOSIS — Z96653 Presence of artificial knee joint, bilateral: Secondary | ICD-10-CM | POA: Diagnosis present

## 2020-01-27 DIAGNOSIS — B952 Enterococcus as the cause of diseases classified elsewhere: Secondary | ICD-10-CM | POA: Diagnosis present

## 2020-01-27 LAB — COMPREHENSIVE METABOLIC PANEL
ALT: 25 U/L (ref 0–44)
AST: 19 U/L (ref 15–41)
Albumin: 2.5 g/dL — ABNORMAL LOW (ref 3.5–5.0)
Alkaline Phosphatase: 99 U/L (ref 38–126)
Anion gap: 9 (ref 5–15)
BUN: 44 mg/dL — ABNORMAL HIGH (ref 8–23)
CO2: 26 mmol/L (ref 22–32)
Calcium: 8.1 mg/dL — ABNORMAL LOW (ref 8.9–10.3)
Chloride: 104 mmol/L (ref 98–111)
Creatinine, Ser: 2.93 mg/dL — ABNORMAL HIGH (ref 0.61–1.24)
GFR calc Af Amer: 23 mL/min — ABNORMAL LOW (ref >60)
GFR calc non Af Amer: 20 mL/min — ABNORMAL LOW (ref >60)
Glucose, Bld: 182 mg/dL — ABNORMAL HIGH (ref 70–99)
Potassium: 4.8 mmol/L (ref 3.5–5.1)
Sodium: 139 mmol/L (ref 135–145)
Total Bilirubin: 1 mg/dL (ref 0.3–1.2)
Total Protein: 6.5 g/dL (ref 6.5–8.1)

## 2020-01-27 LAB — CBC WITH DIFFERENTIAL/PLATELET
Abs Immature Granulocytes: 0.12 10*3/uL — ABNORMAL HIGH (ref 0.00–0.07)
Basophils Absolute: 0 10*3/uL (ref 0.0–0.1)
Basophils Relative: 0 %
Eosinophils Absolute: 0.2 10*3/uL (ref 0.0–0.5)
Eosinophils Relative: 2 %
HCT: 27.7 % — ABNORMAL LOW (ref 39.0–52.0)
Hemoglobin: 8.4 g/dL — ABNORMAL LOW (ref 13.0–17.0)
Immature Granulocytes: 1 %
Lymphocytes Relative: 4 %
Lymphs Abs: 0.4 10*3/uL — ABNORMAL LOW (ref 0.7–4.0)
MCH: 27 pg (ref 26.0–34.0)
MCHC: 30.3 g/dL (ref 30.0–36.0)
MCV: 89.1 fL (ref 80.0–100.0)
Monocytes Absolute: 0.5 10*3/uL (ref 0.1–1.0)
Monocytes Relative: 5 %
Neutro Abs: 10.7 10*3/uL — ABNORMAL HIGH (ref 1.7–7.7)
Neutrophils Relative %: 88 %
Platelets: 433 10*3/uL — ABNORMAL HIGH (ref 150–400)
RBC: 3.11 MIL/uL — ABNORMAL LOW (ref 4.22–5.81)
RDW: 16.9 % — ABNORMAL HIGH (ref 11.5–15.5)
WBC: 12 10*3/uL — ABNORMAL HIGH (ref 4.0–10.5)
nRBC: 0 % (ref 0.0–0.2)

## 2020-01-27 LAB — GLUCOSE, CAPILLARY
Glucose-Capillary: 156 mg/dL — ABNORMAL HIGH (ref 70–99)
Glucose-Capillary: 157 mg/dL — ABNORMAL HIGH (ref 70–99)
Glucose-Capillary: 233 mg/dL — ABNORMAL HIGH (ref 70–99)

## 2020-01-27 LAB — SARS CORONAVIRUS 2 BY RT PCR (HOSPITAL ORDER, PERFORMED IN ~~LOC~~ HOSPITAL LAB): SARS Coronavirus 2: NEGATIVE

## 2020-01-27 LAB — PROTIME-INR
INR: 1.5 — ABNORMAL HIGH (ref 0.8–1.2)
Prothrombin Time: 17.2 seconds — ABNORMAL HIGH (ref 11.4–15.2)

## 2020-01-27 LAB — TROPONIN I (HIGH SENSITIVITY)
Troponin I (High Sensitivity): 49 ng/L — ABNORMAL HIGH (ref <18)
Troponin I (High Sensitivity): 49 ng/L — ABNORMAL HIGH (ref <18)

## 2020-01-27 LAB — BRAIN NATRIURETIC PEPTIDE: B Natriuretic Peptide: 1344.1 pg/mL — ABNORMAL HIGH (ref 0.0–100.0)

## 2020-01-27 MED ORDER — FLUTICASONE PROPIONATE 50 MCG/ACT NA SUSP
2.0000 | Freq: Every day | NASAL | Status: DC | PRN
  Filled 2020-01-27: qty 16

## 2020-01-27 MED ORDER — SODIUM CHLORIDE 0.9% FLUSH
3.0000 mL | Freq: Two times a day (BID) | INTRAVENOUS | Status: DC
  Administered 2020-01-30 – 2020-01-31 (×3): 3 mL via INTRAVENOUS

## 2020-01-27 MED ORDER — MUPIROCIN 2 % EX OINT
1.0000 "application " | TOPICAL_OINTMENT | Freq: Every day | CUTANEOUS | Status: DC | PRN
  Filled 2020-01-27: qty 22

## 2020-01-27 MED ORDER — ASPIRIN EC 81 MG PO TBEC
81.0000 mg | DELAYED_RELEASE_TABLET | Freq: Every day | ORAL | Status: DC
  Administered 2020-01-28 – 2020-02-01 (×5): 81 mg via ORAL
  Filled 2020-01-27 (×5): qty 1

## 2020-01-27 MED ORDER — SODIUM CHLORIDE 0.9% FLUSH: 10.0000 mL | INTRAVENOUS | Status: DC | PRN

## 2020-01-27 MED ORDER — INSULIN ASPART 100 UNIT/ML ~~LOC~~ SOLN
0.0000 [IU] | Freq: Three times a day (TID) | SUBCUTANEOUS | Status: DC
  Administered 2020-01-27 – 2020-01-28 (×4): 3 [IU] via SUBCUTANEOUS
  Administered 2020-01-28: 2 [IU] via SUBCUTANEOUS
  Administered 2020-01-29: 3 [IU] via SUBCUTANEOUS
  Administered 2020-01-29: 5 [IU] via SUBCUTANEOUS
  Administered 2020-01-29: 2 [IU] via SUBCUTANEOUS
  Administered 2020-01-30: 3 [IU] via SUBCUTANEOUS
  Administered 2020-01-30: 2 [IU] via SUBCUTANEOUS
  Administered 2020-01-30: 3 [IU] via SUBCUTANEOUS
  Administered 2020-01-31 (×2): 2 [IU] via SUBCUTANEOUS
  Administered 2020-01-31 – 2020-02-01 (×3): 3 [IU] via SUBCUTANEOUS
  Filled 2020-01-27 (×16): qty 1

## 2020-01-27 MED ORDER — DILTIAZEM HCL ER COATED BEADS 180 MG PO CP24
180.0000 mg | ORAL_CAPSULE | Freq: Every day | ORAL | Status: DC
  Administered 2020-01-27 – 2020-02-01 (×6): 180 mg via ORAL
  Filled 2020-01-27 (×6): qty 1

## 2020-01-27 MED ORDER — SODIUM CHLORIDE 0.9 % IV SOLN
2.0000 g | Freq: Three times a day (TID) | INTRAVENOUS | Status: DC
  Administered 2020-01-28 – 2020-01-29 (×5): 2 g via INTRAVENOUS
  Filled 2020-01-27: qty 2
  Filled 2020-01-27 (×2): qty 2000
  Filled 2020-01-27: qty 2
  Filled 2020-01-27 (×3): qty 2000
  Filled 2020-01-27: qty 2
  Filled 2020-01-27 (×2): qty 2000

## 2020-01-27 MED ORDER — ALBUTEROL SULFATE (2.5 MG/3ML) 0.083% IN NEBU
2.5000 mg | INHALATION_SOLUTION | Freq: Once | RESPIRATORY_TRACT | Status: AC
  Administered 2020-01-27: 2.5 mg via RESPIRATORY_TRACT
  Filled 2020-01-27: qty 3

## 2020-01-27 MED ORDER — SODIUM CHLORIDE 0.9% FLUSH
10.0000 mL | Freq: Two times a day (BID) | INTRAVENOUS | Status: DC
  Administered 2020-01-27 – 2020-02-01 (×9): 10 mL

## 2020-01-27 MED ORDER — HEPARIN SODIUM (PORCINE) 5000 UNIT/ML IJ SOLN
5000.0000 [IU] | Freq: Three times a day (TID) | INTRAMUSCULAR | Status: DC
  Administered 2020-01-28 – 2020-02-01 (×14): 5000 [IU] via SUBCUTANEOUS
  Filled 2020-01-27 (×14): qty 1

## 2020-01-27 MED ORDER — VITAMIN B-12 1000 MCG PO TABS
1000.0000 ug | ORAL_TABLET | Freq: Every day | ORAL | Status: DC
  Administered 2020-01-27 – 2020-02-01 (×6): 1000 ug via ORAL
  Filled 2020-01-27 (×6): qty 1

## 2020-01-27 MED ORDER — ATORVASTATIN CALCIUM 20 MG PO TABS
40.0000 mg | ORAL_TABLET | Freq: Every evening | ORAL | Status: DC
  Administered 2020-01-27 – 2020-01-31 (×5): 40 mg via ORAL
  Filled 2020-01-27 (×5): qty 2

## 2020-01-27 MED ORDER — AMIODARONE HCL 200 MG PO TABS
200.0000 mg | ORAL_TABLET | Freq: Every day | ORAL | Status: DC
  Administered 2020-01-27 – 2020-02-01 (×6): 200 mg via ORAL
  Filled 2020-01-27 (×6): qty 1

## 2020-01-27 MED ORDER — SPIRONOLACTONE 25 MG PO TABS: 25.0000 mg | ORAL_TABLET | Freq: Every day | ORAL | Status: DC

## 2020-01-27 MED ORDER — SODIUM CHLORIDE 0.9 % IV SOLN
2.0000 g | INTRAVENOUS | Status: DC
  Administered 2020-01-27: 2 g via INTRAVENOUS
  Filled 2020-01-27 (×4): qty 2000

## 2020-01-27 MED ORDER — CHLORHEXIDINE GLUCONATE CLOTH 2 % EX PADS
6.0000 | MEDICATED_PAD | Freq: Every day | CUTANEOUS | Status: DC
  Administered 2020-01-28 – 2020-01-31 (×4): 6 via TOPICAL

## 2020-01-27 MED ORDER — FUROSEMIDE 10 MG/ML IJ SOLN
80.0000 mg | Freq: Once | INTRAMUSCULAR | Status: AC
  Administered 2020-01-27: 80 mg via INTRAVENOUS
  Filled 2020-01-27: qty 8

## 2020-01-27 MED ORDER — ENOXAPARIN SODIUM 30 MG/0.3ML ~~LOC~~ SOLN: 30.0000 mg | SUBCUTANEOUS | Status: DC

## 2020-01-27 MED ORDER — ONDANSETRON HCL 4 MG/2ML IJ SOLN: 4.0000 mg | Freq: Four times a day (QID) | INTRAMUSCULAR | Status: DC | PRN

## 2020-01-27 MED ORDER — SODIUM CHLORIDE 0.9% FLUSH: 3.0000 mL | INTRAVENOUS | Status: DC | PRN

## 2020-01-27 MED ORDER — IPRATROPIUM-ALBUTEROL 0.5-2.5 (3) MG/3ML IN SOLN
3.0000 mL | Freq: Four times a day (QID) | RESPIRATORY_TRACT | Status: DC | PRN
  Administered 2020-01-27 – 2020-01-29 (×7): 3 mL via RESPIRATORY_TRACT
  Filled 2020-01-27 (×9): qty 3

## 2020-01-27 MED ORDER — SODIUM CHLORIDE 0.9 % IV SOLN
250.0000 mL | INTRAVENOUS | Status: DC | PRN
  Administered 2020-01-29: 40 mL via INTRAVENOUS
  Administered 2020-01-29 (×2): 250 mL via INTRAVENOUS

## 2020-01-27 MED ORDER — ACETAMINOPHEN 500 MG PO TABS
500.0000 mg | ORAL_TABLET | Freq: Four times a day (QID) | ORAL | Status: DC | PRN
  Administered 2020-01-31: 500 mg via ORAL
  Filled 2020-01-27: qty 1

## 2020-01-27 MED ORDER — POLYETHYLENE GLYCOL 3350 17 G PO PACK
17.0000 g | PACK | Freq: Every day | ORAL | Status: DC | PRN
  Administered 2020-01-29: 17 g via ORAL
  Filled 2020-01-27: qty 1

## 2020-01-27 MED ORDER — CARVEDILOL 6.25 MG PO TABS
6.2500 mg | ORAL_TABLET | Freq: Two times a day (BID) | ORAL | Status: DC
  Administered 2020-01-27 – 2020-02-01 (×10): 6.25 mg via ORAL
  Filled 2020-01-27 (×10): qty 1

## 2020-01-27 MED ORDER — AMPICILLIN IV (FOR PTA / DISCHARGE USE ONLY): 2.0000 g | INTRAVENOUS | Status: DC

## 2020-01-27 MED ORDER — FUROSEMIDE 10 MG/ML IJ SOLN
40.0000 mg | Freq: Two times a day (BID) | INTRAMUSCULAR | Status: DC
  Administered 2020-01-27 – 2020-01-30 (×6): 40 mg via INTRAVENOUS
  Filled 2020-01-27 (×7): qty 4

## 2020-01-27 MED ORDER — TRAMADOL HCL 50 MG PO TABS
50.0000 mg | ORAL_TABLET | Freq: Four times a day (QID) | ORAL | Status: DC | PRN
  Administered 2020-01-30 – 2020-01-31 (×2): 50 mg via ORAL
  Filled 2020-01-27 (×2): qty 1

## 2020-01-27 NOTE — TOC Initial Note (Signed)
Transition of Care [facility]) - Initial/Assessment Note    Patient Details  Name: Jobanny Mavis MRN: [id number] Date of Birth: 09-06-1940  Transition of Care [facility]) CM/SW Contact:    Landa Cellar, RN Phone Number: 01/27/2020, 10:38 AM  Clinical Narrative:                 Patient is current resident of [facility] and plan is currently for patient to return once medically stable.         Patient Goals and CMS Choice        Expected Discharge Plan and Services                                                Prior Living Arrangements/Services                       Activities of Daily Living      Permission Sought/Granted                  Emotional Assessment              Admission diagnosis:  Acute on chronic diastolic CHF (congestive heart failure) (HCC) [I50.33] Patient Active Problem List   Diagnosis Date Noted  . Acute on chronic diastolic CHF (congestive heart failure) (HCC) 01/27/2020  . Acute-on-chronic kidney injury (HCC) 01/27/2020  . Diverticulosis of colon without hemorrhage   . Intestinal bypass or anastomosis status   . Melena 01/10/2020  . Symptomatic anemia 01/10/2020  . Acute blood loss anemia 01/10/2020  . Suspect Septic arthritis (HCC) 01/10/2020  . Chronic anticoagulation 01/10/2020  . 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 Chronic kidney disease, stage 3a 01/10/2020  . Acute on chronic congestive heart failure (HCC) 01/10/2020  . Elevated troponin 01/10/2020  . Chest pain 06/26/2019  . CAD, multiple vessel 06/26/2019  . 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 Atrial fibrillation status post cardioversion 10/22/19 (HCC) 06/26/2019  . Essential hypertension 06/26/2019  . DM (diabetes mellitus), type 2 (HCC) 06/26/2019  . Anginal equivalent (HCC) 06/26/2019   PCP:  Elspeth Cho., MD Pharmacy:   [facility], [place] - [address] [place] [address] Phone:  [phone] Fax: [phone]  [facility] - [place], [place] - [address] [place] [address] Phone: [phone] Fax: [phone]     Social Determinants of Health (SDOH) Interventions    Readmission Risk Interventions No flowsheet data found.

## 2020-01-27 NOTE — ED Notes (Signed)
Pt given warm blankets.

## 2020-01-27 NOTE — ED Provider Notes (Signed)
[facility] Emergency Department Provider Note ____________________________________________   First MD Initiated Contact with Patient 01/27/20 [phone]     (approximate)  I have reviewed the triage vital signs and the nursing notes.   HISTORY  Chief Complaint Shortness of Breath    HPI Randall Vincent is a 79 y.o. male with PMH as noted below including a history of CAD, CHF, hypertension, atrial fibrillation, diabetes, and currently being treated for left knee septic arthritis with IV antibiotics via PICC line; he presents with shortness of breath, acute onset 2 days ago, intermittent since then and worse with any exertion.  The patient states that he feels pressure in his upper abdomen and lower chest as if he has fluid.  He denies any acute leg pain or swelling.  The patient is not normally on any oxygen, but has required oxygen for any exertion over the last few days.  EMS placed him on 6 L of O2 by nasal cannula.  Past Medical History:  Diagnosis Date  . A-fib (HCC)   . Diabetes (HCC)   . Hyperlipidemia   . Hypertension     Patient Active Problem List   Diagnosis Date Noted  . Diverticulosis of colon without hemorrhage   . Intestinal bypass or anastomosis status   . Melena 01/10/2020  . Symptomatic anemia 01/10/2020  . Acute blood loss anemia 01/10/2020  . Suspect Septic arthritis (HCC) 01/10/2020  . Chronic anticoagulation 01/10/2020  . 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 Chronic kidney disease, stage 3a 01/10/2020  . Acute on chronic congestive heart failure (HCC) 01/10/2020  . Elevated troponin 01/10/2020  . Chest pain 06/26/2019  . CAD, multiple vessel 06/26/2019  . 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 Atrial fibrillation status post cardioversion 10/22/19 (HCC) 06/26/2019  . Essential hypertension 06/26/2019  . DM (diabetes mellitus), type 2 (HCC) 06/26/2019  . Anginal equivalent (HCC) 06/26/2019    Past Surgical History:  Procedure  Laterality Date  . BLADDER SURGERY    . BYPASS GRAFT    . CARDIOVERSION    . COLON SURGERY    . COLONOSCOPY N/A 01/15/2020   Procedure: COLONOSCOPY;  Surgeon: Pasty Spillersahiliani, Varnita B, MD;  [facility];  Service: Endoscopy;  Laterality: N/A;  . COLONOSCOPY N/A 01/16/2020   Procedure: COLONOSCOPY;  Surgeon: Pasty Spillersahiliani, Varnita B, MD;  Location: [facility] ENDOSCOPY;  Service: Endoscopy;  Laterality: N/A;  . ESOPHAGOGASTRODUODENOSCOPY N/A 01/13/2020   Procedure: ESOPHAGOGASTRODUODENOSCOPY (EGD);  Surgeon: Pasty Spillersahiliani, Varnita B, MD;  [facility];  Service: Endoscopy;  Laterality: N/A;  . REPLACEMENT TOTAL KNEE BILATERAL    . STENT PLACEMENT VASCULAR ([facility] HX)    . TOTAL KNEE REVISION Left 01/16/2020   Procedure: TOTAL KNEE REVISION;  Surgeon: Lyndle HerrlichBowers, James R, MD;  Location: [facility] ORS;  Service: Orthopedics;  Laterality: Left;    Prior to Admission medications   Medication Sig Start Date End Date Taking? Authorizing Provider  acetaminophen (TYLENOL) 500 MG tablet Take 500-1,000 mg by mouth every 6 (six) hours as needed for mild pain or fever.    [provider]  albuterol (VENTOLIN HFA) 108 (90 Base) MCG/ACT inhaler Inhale 2 puffs into the lungs every 6 (six) hours as needed for wheezing.    [provider]  amiodarone (PACERONE) 200 MG tablet Take 200 mg by mouth daily. 10/29/19   [provider]  ampicillin IVPB Inject 2 g into the vein every 4 (four) hours. Indication: E. Faecalis PJI of knee Last Day of Therapy:  02/22/2020 Labs - Once weekly:  CBC/D, CRP and CMP, 01/22/20 02/22/20  Lurene Shadow, MD  aspirin EC 81 MG tablet Take 1 tablet (81 mg total) by mouth daily. Swallow whole. 01/22/20 02/21/20  Lurene Shadow, MD  atorvastatin (LIPITOR) 40 MG tablet Take 40 mg by mouth daily. 06/18/19   [provider]  carvedilol (COREG) 6.25 MG tablet Take 1 tablet (6.25 mg total) by mouth 2 (two) times daily with a meal. 01/22/20   Lurene Shadow, MD  diltiazem  (CARDIZEM CD) 180 MG 24 hr capsule Take 1 capsule (180 mg total) by mouth daily. 01/22/20 01/21/21  Lurene Shadow, MD  fluticasone (FLONASE) 50 MCG/ACT nasal spray Place 2 sprays into both nostrils daily as needed for allergies or rhinitis.    [provider]  mupirocin ointment (BACTROBAN) 2 % Apply 1 application topically daily as needed (leg sores).  11/16/19   [provider]  polyethylene glycol (MIRALAX / GLYCOLAX) 17 g packet Take 17 g by mouth daily as needed for up to 7 days for mild constipation. 01/22/20 01/29/20  Lurene Shadow, MD  Semaglutide, 1 MG/DOSE, 2 MG/1.5ML SOPN Inject 1 mg into the skin every Thursday.    [provider]  sildenafil (VIAGRA) 100 MG tablet Take 100 mg by mouth daily as needed for erectile dysfunction. 06/24/19   [provider]  spironolactone (ALDACTONE) 25 MG tablet Take 25 mg by mouth daily. 06/18/19   [provider]  traMADol (ULTRAM) 50 MG tablet Take 1 tablet (50 mg total) by mouth every 6 (six) hours as needed for up to 5 days for moderate pain. 01/22/20 01/27/20  Lurene Shadow, MD  vitamin B-12 (CYANOCOBALAMIN) 1000 MCG tablet Take 1,000 mcg by mouth daily.    [provider]    Allergies Patient has no known allergies.  History reviewed. No pertinent family history.  Social History Social History   Tobacco Use  . Smoking status: Former Smoker    Quit date: 1991    Years since quitting: 30.7  . Smokeless tobacco: Never Used  Substance Use Topics  . Alcohol use: Not on file  . Drug use: Not on file    Review of Systems  Constitutional: No fever/chills. Eyes: No redness. ENT: No sore throat. Cardiovascular: Denies chest pain. Respiratory: Positive for shortness of breath. Gastrointestinal: No vomiting or diarrhea.  Genitourinary: Negative for flank pain.  Musculoskeletal: Negative for back pain. Skin: Negative for rash. Neurological: Negative for  headache.   ____________________________________________   PHYSICAL EXAM:  VITAL SIGNS: ED Triage Vitals  Enc Vitals Group     BP --      Pulse --      Resp --      Temp --      Temp src --      SpO2 --      Weight 01/27/20 0805 (!) 306 lb 5.6 oz (139 kg)     Height 01/27/20 0805 5\' 8"  (1.727 m)     Head Circumference --      Peak Flow --      Pain Score 01/27/20 0806 0     Pain Loc --      Pain Edu? --      Excl. in GC? --     Constitutional: Alert and oriented.  Somewhat chronically ill-appearing with increased work of breathing but in no acute distress. Eyes: Conjunctivae are normal.  Head: Atraumatic. Nose: No congestion/rhinnorhea. Mouth/Throat: Mucous membranes are moist.   Neck: Normal range  of motion.  Cardiovascular: Normal rate, regular rhythm. Grossly normal heart sounds.  Good peripheral circulation. Respiratory: Increased respiratory effort with accessory muscle use.  Speaking in short sentences.  Faint rales bilaterally with scattered wheezes. Gastrointestinal: Soft and nontender. No distention.  Genitourinary: No flank tenderness. Musculoskeletal: No lower extremity edema.  Extremities warm and well perfused.  (Left lower extremity in a knee immobilizer). Neurologic:  Normal speech and language. No gross focal neurologic deficits are appreciated.  Skin:  Skin is warm and dry. No rash noted. Psychiatric: Mood and affect are normal. Speech and behavior are normal.  ____________________________________________   LABS (all labs ordered are listed, but only abnormal results are displayed)  Labs Reviewed  COMPREHENSIVE METABOLIC PANEL - Abnormal; Notable for the following components:      Result Value   Glucose, Bld 182 (*)    BUN 44 (*)    Creatinine, Ser 2.93 (*)    Calcium 8.1 (*)    Albumin 2.5 (*)    GFR calc non Af Amer 20 (*)    GFR calc Af Amer 23 (*)    All other components within normal limits  CBC WITH DIFFERENTIAL/PLATELET - Abnormal;  Notable for the following components:   WBC 12.0 (*)    RBC 3.11 (*)    Hemoglobin 8.4 (*)    HCT 27.7 (*)    RDW 16.9 (*)    Platelets 433 (*)    Neutro Abs 10.7 (*)    Lymphs Abs 0.4 (*)    Abs Immature Granulocytes 0.12 (*)    All other components within normal limits  BRAIN NATRIURETIC PEPTIDE - Abnormal; Notable for the following components:   B Natriuretic Peptide 1,344.1 (*)    All other components within normal limits  PROTIME-INR - Abnormal; Notable for the following components:   Prothrombin Time 17.2 (*)    INR 1.5 (*)    All other components within normal limits  TROPONIN I (HIGH SENSITIVITY) - Abnormal; Notable for the following components:   Troponin I (High Sensitivity) 49 (*)    All other components within normal limits  SARS CORONAVIRUS 2 BY RT PCR (HOSPITAL ORDER, PERFORMED IN [facility])  TROPONIN I (HIGH SENSITIVITY)   ____________________________________________  EKG  ED ECG REPORT I, Dionne Bucy, the attending physician, personally viewed and interpreted this ECG.  Date: 01/27/2020 EKG Time: 0807 Rate: 77 Rhythm: normal sinus rhythm QRS Axis: normal Intervals: Nonspecific IVCD ST/T Wave abnormalities: Nonspecific abnormalities Narrative Interpretation: Nonspecific abnormalities with no evidence of acute ischemia (EKG interpretation limited due to poor baseline in several leads)  ____________________________________________  RADIOLOGY  CXR: Bilateral pulmonary edema and small pleural effusions  ____________________________________________   PROCEDURES  Procedure(s) performed: No  Procedures  Critical Care performed: Yes  CRITICAL CARE Performed by: Dionne Bucy   Total critical care time: 30 minutes  Critical care time was exclusive of separately billable procedures and treating other patients.  Critical care was necessary to treat or prevent imminent or life-threatening deterioration.  Critical care  was time spent personally by me on the following activities: development of treatment plan with patient and/or surrogate as well as nursing, discussions with consultants, evaluation of patient's response to treatment, examination of patient, obtaining history from patient or surrogate, ordering and performing treatments and interventions, ordering and review of laboratory studies, ordering and review of radiographic studies, pulse oximetry and re-evaluation of patient's condition. ____________________________________________   INITIAL IMPRESSION / ASSESSMENT AND PLAN / ED COURSE  Pertinent labs &  imaging results that were available during my care of the patient were reviewed by me and considered in my medical decision making (see chart for details).  79 year old male with PMH as noted above including CAD, CHF, diabetes, hypertension, atrial fibrillation (previously on Eliquis and Pradaxa, now no longer on anticoagulation), CKD, and recent admission for upper GI bleed and left knee septic arthritis status post total knee revision presents with shortness of breath over the last few days.  The patient normally does not require oxygen, but has required with exertion over the last few days.  I reviewed the past medical records in epic.  I confirmed a recent admission with discharge on 9/1 during which she was treated for the septic arthritis and had total knee revision, explant of his knee replacement, and placement of a spacer.  He is currently on IV ampicillin via a PICC line until October.  He was also treated with Lasix for acute on chronic diastolic CHF.  He discontinued anticoagulation after the GI bleed.  He is on aspirin only at this time.  On exam, patient has increased work of breathing and is speaking in short sentences.   Vital signs are normal except for tachypnea and hypertension. He has faint rales bilaterally with some scattered wheezing.  Oxygen was turned down to 2 L by nasal cannula and he  is doing well with this.  He has no significant peripheral edema.  The exam is otherwise as described above.  Differential includes acute on chronic CHF, asthma/bronchitis, COVID-19, pneumonia, ACS, or less likely possible PE.  We will obtain a chest x-ray, lab work-up, and reassess.   ----------------------------------------- 10:18 AM on 01/27/2020 -----------------------------------------  Chest x-ray and lab work-up are consistent with acute CHF exacerbation.  There is no clinical evidence of PE.  Given the patient's new oxygen requirement, we will admit him.  His Covid swab is negative.  Lab work-up also reveals acute on chronic renal insufficiency.  The patient has gotten IV Lasix, and is still somewhat tachypneic but comfortable appearing on nasal cannula.  I discussed the case with Dr. Joylene Igo from the hospitalist service for admission.  ____________________________________________   FINAL CLINICAL IMPRESSION(S) / ED DIAGNOSES  Final diagnoses:  Acute on chronic respiratory failure with hypoxia (HCC)  Acute on chronic congestive heart failure, unspecified heart failure type (HCC)      NEW MEDICATIONS STARTED DURING THIS VISIT:  New Prescriptions   No medications on file     Note:  This document was prepared using Dragon voice recognition software and may include unintentional dictation errors.    Dionne Bucy, MD 01/27/20 1020

## 2020-01-27 NOTE — ED Notes (Addendum)
Pt repositioned in the bed to help assist with eating. Pt lunch given, son at bedside assisting with eating.

## 2020-01-27 NOTE — ED Notes (Signed)
Pharmacy working to verify meds.

## 2020-01-27 NOTE — ED Triage Notes (Signed)
Pt was having SHOB this AM. [facility]  Arrived EMS on O2.  Pt on 2 L at this time, turned down from 6 L with EMS.  sats 100% on arrival.

## 2020-01-27 NOTE — ED Notes (Signed)
Pt wheezy and mildly SOB with movement. Duoneb given

## 2020-01-27 NOTE — Progress Notes (Signed)
PHARMACY NOTE:  ANTIMICROBIAL RENAL DOSAGE ADJUSTMENT  Current antimicrobial regimen includes a mismatch between antimicrobial dosage and estimated renal function.  As per policy approved by the Pharmacy & Therapeutics and Medical Executive Committees, the antimicrobial dosage will be adjusted accordingly.  Current antimicrobial dosage:  ampicillin 2 grams IV every 4 hours  Indication: enterococcal infection  Renal Function:  Estimated Creatinine Clearance: 27.2 mL/min (A) (by C-G formula based on SCr of 2.93 mg/dL (H)). []      On intermittent HD, scheduled: []      On CRRT    Antimicrobial dosage has been changed to:  Ampicillin 2 grams IV every 8 hours   Thank you for allowing pharmacy to be a part of this patient's care.  , [facility] 01/27/2020 2:05 PM

## 2020-01-27 NOTE — H&P (Addendum)
History and Physical    Randall Vincent ULA:[id number] DOB: [birth year] DOA: 01/27/2020  PCP: Elspeth Cho., MD   Patient coming from: Skilled nursing facility  I have personally briefly reviewed patient's old medical records in [facility]  Chief Complaint: Shortness of breath  HPI: Randall Vincent is a 79 y.o. 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  Patient presents to the ER from the skilled nursing facility where he was discharged to following his last hospitalization for left septic arthritis to complete long-term IV antibiotic therapy for evaluation of shortness of breath which started about 2 days ago mostly with exertion but now he is short of breath at rest.  He complains of pressure in his chest and upper abdomen but has no lower extremity swelling.  He denies having any orthopnea or paroxysmal nocturnal dyspnea.  Patient does not wear oxygen normally but for the last 2 days has required oxygen supplementation.  Patient states that on 2 L his pulse oximetry was down to 84% and so the increase his oxygen to about 5 to 6 L.  He arrived to the ER on 6 L but has been weaned down to 2 L of oxygen. He denies having any abdominal pain, no nausea, no vomiting, no diaphoresis, no palpitations, no urinary symptoms or changes in his bowel habits. Labs reveal sodium, potassium 4.1 chloride 105, bicarb 25 BUN 44, creatinine 2.93 << 1.3, calcium 8.7, AST 19, ALT 25, BNP 1344, white count 12, hemoglobin 8.4, hematocrit 27.7, MCV 89.1, platelet count 433. Chest x-ray reviewed by me shows new mild pulmonary edema and probable small bilateral pleural effusions consistent with congestive heart failure. Twelve-lead EKG  reviewed by me shows sinus rhythm with multiple PVCs   ED Course: Patient is a 79 year old male who was sent to the ER from the skilled nursing facility for evaluation of worsening shortness of breath with an increased oxygen requirement.  Patient's COVID-19 PCR test is negative but chest x-ray shows pulmonary edema.  Labs also show worsening of his renal function from baseline 1.3 >> 2.93 He received a dose of Lasix in the ER and will be admitted to the hospital for further evaluation  Review of Systems: As per HPI otherwise 10 point review of systems negative.    Past Medical History:  Diagnosis Date  . A-fib (HCC)   . Diabetes (HCC)   . Hyperlipidemia   . Hypertension     Past Surgical History:  Procedure Laterality Date  . BLADDER SURGERY    . BYPASS GRAFT    . CARDIOVERSION    . COLON SURGERY    . COLONOSCOPY N/A 01/15/2020   Procedure: COLONOSCOPY;  Surgeon: Pasty Spillers, MD;  Location: [facility] ENDOSCOPY;  Service: Endoscopy;  Laterality: N/A;  . COLONOSCOPY N/A 01/16/2020   Procedure: COLONOSCOPY;  Surgeon: Pasty Spillers, MD;  Location: [facility] ENDOSCOPY;  Service: Endoscopy;  Laterality: N/A;  . ESOPHAGOGASTRODUODENOSCOPY N/A 01/13/2020   Procedure: ESOPHAGOGASTRODUODENOSCOPY (EGD);  Surgeon: Pasty Spillers, MD;  Location: [facility] ENDOSCOPY;  Service: Endoscopy;  Laterality: N/A;  . REPLACEMENT TOTAL KNEE BILATERAL    . STENT PLACEMENT VASCULAR ([facility] HX)    . TOTAL KNEE REVISION Left 01/16/2020   Procedure: TOTAL KNEE REVISION;  Surgeon: Lyndle Herrlich,  MD;  Location: [facility] ORS;  Service: Orthopedics;  Laterality: Left;     reports that he quit smoking about 30 years ago. He has never used smokeless tobacco. No history on file for alcohol use and drug use.  No Known Allergies  History reviewed. No pertinent family history.   Prior to Admission medications   Medication Sig Start Date End Date Taking? Authorizing Provider  acetaminophen (TYLENOL) 500 MG  tablet Take 500-1,000 mg by mouth every 6 (six) hours as needed for mild pain or fever.    [provider]  albuterol (VENTOLIN HFA) 108 (90 Base) MCG/ACT inhaler Inhale 2 puffs into the lungs every 6 (six) hours as needed for wheezing.    [provider]  amiodarone (PACERONE) 200 MG tablet Take 200 mg by mouth daily. 10/29/19   [provider]  ampicillin IVPB Inject 2 g into the vein every 4 (four) hours. Indication: E. Faecalis PJI of knee Last Day of Therapy:  02/22/2020 Labs - Once weekly:  CBC/D, CRP and CMP, 01/22/20 02/22/20  Lurene Shadow, MD  aspirin EC 81 MG tablet Take 1 tablet (81 mg total) by mouth daily. Swallow whole. 01/22/20 02/21/20  Lurene Shadow, MD  atorvastatin (LIPITOR) 40 MG tablet Take 40 mg by mouth daily. 06/18/19   [provider]  carvedilol (COREG) 6.25 MG tablet Take 1 tablet (6.25 mg total) by mouth 2 (two) times daily with a meal. 01/22/20   Lurene Shadow, MD  diltiazem (CARDIZEM CD) 180 MG 24 hr capsule Take 1 capsule (180 mg total) by mouth daily. 01/22/20 01/21/21  Lurene Shadow, MD  fluticasone (FLONASE) 50 MCG/ACT nasal spray Place 2 sprays into both nostrils daily as needed for allergies or rhinitis.    [provider]  mupirocin ointment (BACTROBAN) 2 % Apply 1 application topically daily as needed (leg sores).  11/16/19   [provider]  polyethylene glycol (MIRALAX / GLYCOLAX) 17 g packet Take 17 g by mouth daily as needed for up to 7 days for mild constipation. 01/22/20 01/29/20  Lurene Shadow, MD  Semaglutide, 1 MG/DOSE, 2 MG/1.5ML SOPN Inject 1 mg into the skin every Thursday.    [provider]  sildenafil (VIAGRA) 100 MG tablet Take 100 mg by mouth daily as needed for erectile dysfunction. 06/24/19   [provider]  spironolactone (ALDACTONE) 25 MG tablet Take 25 mg by mouth daily. 06/18/19   [provider]  traMADol (ULTRAM) 50 MG tablet Take 1 tablet (50 mg total) by mouth every 6 (six)  hours as needed for up to 5 days for moderate pain. 01/22/20 01/27/20  Lurene Shadow, MD  vitamin B-12 (CYANOCOBALAMIN) 1000 MCG tablet Take 1,000 mcg by mouth daily.    [provider]    Physical Exam: Vitals:   01/27/20 1230 01/27/20 1300 01/27/20 1330 01/27/20 1400  BP: (!) 180/67 (!) 154/64 (!) 166/67 (!) 175/61  Pulse: 82 73 69 80  Resp: (!) 31 (!) 24 (!) 23 (!) 28  Temp:      TempSrc:      SpO2: 94% 100% 98% 97%  Weight:      Height:         Vitals:   01/27/20 1230 01/27/20 1300 01/27/20 1330 01/27/20 1400  BP: (!) 180/67 (!) 154/64 (!) 166/67 (!) 175/61  Pulse: 82 73 69 80  Resp: (!) 31 (!) 24 (!) 23 (!) 28  Temp:      TempSrc:      SpO2: 94% 100%  98% 97%  Weight:      Height:        Constitutional: NAD, alert and oriented x 3.  Morbidly obese.  Mild respiratory distress with conversational dyspnea.  Anasarca Eyes: PERRL, lids and conjunctivae normal ENMT: Mucous membranes are moist.  Neck: normal, supple, no masses, no thyromegaly Respiratory: Crackles at the bases, scattered wheezing. Normal respiratory effort. No accessory muscle use.  Cardiovascular: Regular rate and rhythm, no murmurs / rubs / gallops. No extremity edema. 2+ pedal pulses. No carotid bruits.  Abdomen: no tenderness, no masses palpated. No hepatosplenomegaly. Bowel sounds positive.  Musculoskeletal: no clubbing / cyanosis. No joint deformity upper and lower extremities.  PICC line in the right antecubital fossa Skin: no rashes, lesions, ulcers.  Neurologic: No gross focal neurologic deficit. Psychiatric: Normal mood and affect.   Labs on Admission: I have personally reviewed following labs and imaging studies  CBC: Recent Labs  Lab 01/21/20 0352 01/21/20 0352 01/21/20 0531 01/21/20 1814 01/22/20 0205 01/22/20 1010 01/27/20 0811  WBC 10.1  --   --   --   --   --  12.0*  NEUTROABS  --   --   --   --   --   --  10.7*  HGB 7.0*   < > 7.5* 7.3* 6.9* 8.8* 8.4*  HCT 22.5*   < >  23.9* 22.9* 22.4* 27.4* 27.7*  MCV 87.5  --   --   --   --   --  89.1  PLT 407*  --   --   --   --   --  433*   < > = values in this interval not displayed.   Basic Metabolic Panel: Recent Labs  Lab 01/21/20 0352 01/22/20 0350 01/27/20 0811  NA 138 136 139  K 4.0 4.3 4.8  CL 106 103 104  CO2 GLUCOSE 147* 167* 182*  BUN 14 16 44*  CREATININE 1.20 1.32* 2.93*  CALCIUM 7.7* 8.1* 8.1*  MG 2.2 1.9  --    GFR: Estimated Creatinine Clearance: 27.2 mL/min (A) (by C-G formula based on SCr of 2.93 mg/dL (H)). Liver Function Tests: Recent Labs  Lab 01/27/20 0811  AST 19  ALT 25  ALKPHOS 99  BILITOT 1.0  PROT 6.5  ALBUMIN 2.5*   No results for input(s): LIPASE, AMYLASE in the last 168 hours. No results for input(s): AMMONIA in the last 168 hours. Coagulation Profile: Recent Labs  Lab 01/27/20 0811  INR 1.5*   Cardiac Enzymes: No results for input(s): CKTOTAL, CKMB, CKMBINDEX, TROPONINI in the last 168 hours. BNP (last 3 results) No results for input(s): PROBNP in the last 8760 hours. HbA1C: No results for input(s): HGBA1C in the last 72 hours. CBG: Recent Labs  Lab 01/21/20 2357 01/22/20 0459 01/22/20 0750 01/22/20 1150 01/27/20 1156  GLUCAP 169* 146* 167* 145* 156*   Lipid Profile: No results for input(s): CHOL, HDL, LDLCALC, TRIG, CHOLHDL, LDLDIRECT in the last 72 hours. Thyroid Function Tests: No results for input(s): TSH, T4TOTAL, FREET4, T3FREE, THYROIDAB in the last 72 hours. Anemia Panel: No results for input(s): VITAMINB12, FOLATE, FERRITIN, TIBC, IRON, RETICCTPCT in the last 72 hours. Urine analysis: No results found for: COLORURINE, APPEARANCEUR, LABSPEC, PHURINE, GLUCOSEU, HGBUR, BILIRUBINUR, KETONESUR, PROTEINUR, UROBILINOGEN, NITRITE, LEUKOCYTESUR  Radiological Exams on Admission: DG Chest Portable 1 View  Result Date: 01/27/2020 CLINICAL DATA:  Shortness of breath. History of diabetes, atrial fibrillation and hypertension. EXAM:  PORTABLE CHEST 1 VIEW COMPARISON:  Radiographs 01/10/2020 and 06/26/2019. FINDINGS: 0814 hours. Two views obtained. There is stable cardiac enlargement post median sternotomy and CABG. There is aortic atherosclerosis. There is new mild pulmonary edema and probable small bilateral pleural effusions. No confluent airspace opacity or pneumothorax. Apparent right arm PICC not visualized beyond the level of the right sternoclavicular joint, likely in the right brachiocephalic vein. No acute osseous findings. IMPRESSION: 1. New mild pulmonary edema and probable small bilateral pleural effusions consistent with congestive heart failure. 2. Apparent right arm PICC tip is likely in the right brachiocephalic vein. Electronically Signed   By: Carey Bullocks M.D.   On: 01/27/2020 08:24    EKG: Independently reviewed.  Sinus rhythm with PVCs  Assessment/Plan Principal Problem:   Acute on chronic diastolic CHF (congestive heart failure) (HCC) Active Problems:   Essential hypertension   DM (diabetes mellitus), type 2 (HCC)   Chronic kidney disease, stage 3a   Acute-on-chronic kidney injury (HCC)   A-fib (HCC)   Obesity, Class III, BMI 40-49.9 (morbid obesity) (HCC)     Acute on chronic diastolic dysfunction CHF  Patient presented for evaluation ofshortness of breathabove baseline with a new oxygen requirement He was initially on 6 L of oxygen but has been weaned down to 2 L Chest x-ray shows evidence of pulmonary vascular congestion and BNP is elevated at 1344 compared to 400 during his last hospitalization Echo on 8/21 showed EF 50-55%, diastolic parameters not evaluated.   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     Diagnosed during his last hospitalization  Operative tissue cultures grew Enterococcus faecalis. Had surgery 8/26, removed L knee prosthesis and placed antibiotic spacer Continue IV ampicillin 2 g every 4 hours Patient completed antibiotic therapy on 10/02    CAD, multiple vessel, S/P CABG x 2 -  Stable.   Continue statin, carvedilol and aspirin    Atrial fibrillation  Status post cardioversion 10/22/19.   Currently in sinus rhythm.   Continue amiodarone, carvedilol and diltiazem Patient was on long-term anticoagulation with Pradaxa as primary prophylaxis for an acute stroke anticoagulation was discontinued following his last hospitalization for acute blood loss anemia related to GI bleed  Patient declines further anticoagulation therapy and wishes to be placed on only aspirin     Essential hypertension  Chronic.   Continue carvedilol and diltiazem    Type 2 Diabetes mellitus with complications of chronic kidney disease stage III Well controlled, A1C is 6.7%.   Place patient on consistent carbohydrate diet Glycemic control with sliding scale insulin   Acute kidney injury  At baseline patient has a serum creatinine of 1.3 and today on admission it is 2.9 Unclear etiology for acute kidney injury Obtain renal ultrasound Hold Aldactone Avoid nephrotoxins We will consult nephrology if no improvement    Obesity (BMI 43) Body mass index is 46.56 kg/m.   Complicates overall prognosis and care      DVT prophylaxis: Lovenox Code Status: Full code Family Communication: Greater than 50% of time was spent discussing plan of care with patient at the bedside.  He verbalizes understanding and agrees with the plan. Disposition Plan: Back to previous home environment Consults called: Please consult Dr Odis Luster in am    Chanice Brenton  Kamree Wiens MD [facility]     01/27/2020, 2:48 PM

## 2020-01-28 LAB — BASIC METABOLIC PANEL
Anion gap: 7 (ref 5–15)
BUN: 48 mg/dL — ABNORMAL HIGH (ref 8–23)
CO2: 28 mmol/L (ref 22–32)
Calcium: 8.1 mg/dL — ABNORMAL LOW (ref 8.9–10.3)
Chloride: 103 mmol/L (ref 98–111)
Creatinine, Ser: 2.56 mg/dL — ABNORMAL HIGH (ref 0.61–1.24)
GFR calc Af Amer: 27 mL/min — ABNORMAL LOW (ref >60)
GFR calc non Af Amer: 23 mL/min — ABNORMAL LOW (ref >60)
Glucose, Bld: 153 mg/dL — ABNORMAL HIGH (ref 70–99)
Potassium: 4.7 mmol/L (ref 3.5–5.1)
Sodium: 138 mmol/L (ref 135–145)

## 2020-01-28 LAB — GLUCOSE, CAPILLARY
Glucose-Capillary: 141 mg/dL — ABNORMAL HIGH (ref 70–99)
Glucose-Capillary: 153 mg/dL — ABNORMAL HIGH (ref 70–99)
Glucose-Capillary: 178 mg/dL — ABNORMAL HIGH (ref 70–99)
Glucose-Capillary: 212 mg/dL — ABNORMAL HIGH (ref 70–99)

## 2020-01-28 NOTE — Progress Notes (Signed)
Progress Note    Randall Vincent  FUX:[id number] DOB: 1940-06-01  DOA: 01/27/2020 PCP: Elspeth Cho., MD      Brief Narrative:    Medical records reviewed and are as summarized below:  Randall Vincent is a 79 y.o. 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   He was recently discharged from the hospital on 01/22/2020 after hospitalization for acute blood loss anemia and left septic knee arthritis.  He presented to the hospital because of chest pressure and shortness of breath.  He was admitted to the hospital for acute exacerbation of chronic diastolic CHF.      Assessment/Plan:   Principal Problem:   Acute on chronic diastolic CHF (congestive heart failure) (HCC) Active Problems:   Essential hypertension   DM (diabetes mellitus), type 2 (HCC)   Chronic kidney disease, stage 3a   Acute-on-chronic kidney injury (HCC)   A-fib (HCC)   Obesity, Class III, BMI 40-49.9 (morbid obesity) (HCC)   Acute on chronic diastolic dysfunction CHF  Continue IV Lasix.  Monitor BMP, daily weight and urine output.  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  Diagnosed during his last hospitalization Operative tissue cultures grew Enterococcus faecalis. Had surgery 8/26, removed L knee prosthesis and placed antibiotic spacer Continue IV ampicillin 2 g every 4 hours through 02/22/2020.   CAD, multiple vessel, S/P CABG x 2-  Stable.  Continue statin, carvedilol and aspirin    Atrial fibrillation Status post cardioversion 10/22/19.  Currently in sinus rhythm.  Continue amiodarone, carvedilol  and diltiazem Patient was on long-term anticoagulation with Pradaxa as primary prophylaxis for an acute stroke anticoagulation was discontinued following his last hospitalization for acute blood loss anemia related to GI bleed  Patient declines further anticoagulation therapy and wishes to be placed on only aspirin     Essential hypertension Chronic.  Continue carvedilol and diltiazem    Type 2 Diabetesmellitus with complications of chronic kidney disease stage III Well controlled, A1C is 6.7%.  Place patient on consistent carbohydrate diet Glycemic control with sliding scale insulin   Acute kidney injury  This is probably due to cardiorenal syndrome.  At baseline patient has a serum creatinine of 1.3.  Creatinine is trending down.  Monitor BMP closely. No acute abnormality on renal ultrasound. Aldactone is on hold. Avoid nephrotoxins         Diet Order            Diet heart healthy/carb modified Room service appropriate? Yes; Fluid consistency: Thin  Diet effective now                    Consultants:  None  Procedures:  None    Medications:   . amiodarone  200 mg Oral Daily  . aspirin EC  81 mg Oral Daily  . atorvastatin  40 mg Oral QPM  . carvedilol  6.25 mg Oral BID WC  . Chlorhexidine Gluconate Cloth  6 each Topical Daily  . diltiazem  180 mg Oral Daily  . furosemide  40 mg Intravenous BID  . heparin injection (subcutaneous)  5,000 Units Subcutaneous Q8H  . insulin aspart  0-15 Units Subcutaneous TID WC  . sodium chloride flush  10-40 mL Intracatheter Q12H  . sodium chloride flush  3 mL Intravenous Q12H  . vitamin B-12  1,000 mcg Oral Daily   Continuous Infusions: . sodium chloride    . ampicillin (OMNIPEN) IV 2 g (01/28/20 5681)     Anti-infectives (From admission, onward)   Start     Dose/Rate Route Frequency Ordered Stop   01/27/20 2200  ampicillin (OMNIPEN) 2 g in sodium chloride 0.9 % 100 mL IVPB        2 g 300 mL/hr  over 20 Minutes Intravenous Every 8 hours 01/27/20 1409 02/22/20 2359   01/27/20 1200  ampicillin (OMNIPEN) 2 g in sodium chloride 0.9 % 100 mL IVPB  Status:  Discontinued        2 g 300 mL/hr over 20 Minutes Intravenous Every 4 hours 01/27/20 1132 01/27/20 1409   01/27/20 1030  ampicillin  Status:  Discontinued       Note to Pharmacy: Indication: E. Faecalis PJI of knee Last Day of Therapy:  02/22/2020 Labs - Once weekly:  CBC/D, CRP and CMP,     2 g Intravenous Every 4 hours 01/27/20 1025 01/27/20 1129             Family Communication/Anticipated D/C date and plan/Vincent Status   DVT prophylaxis: heparin injection 5,000 Units Start: 01/27/20 2200     Vincent Status: Full Vincent  Family Communication: Plan discussed with patient Disposition Plan:    Status is: Inpatient  Remains inpatient appropriate because:IV treatments appropriate due to intensity of illness or inability to take PO and Inpatient level of care appropriate due to severity of illness   Dispo: The patient is from: SNF              Anticipated d/c is to: SNF              Anticipated d/c date is: 1 day              Patient currently is not medically stable to d/c.           Subjective:   C/o shortness of breath and leg swelling.  Objective:    Vitals:   01/28/20 0308 01/28/20 0756 01/28/20 1150 01/28/20 1635  BP: (!) 154/63 (!) 162/61 136/61 (!) 158/65  Pulse: 65 66 (!) 56 64  Resp: 18 18 18 18   Temp: 98.2 F (36.8 C) 98 F (36.7 C) 98 F (36.7 C) 97.8 F (36.6 C)  TempSrc: Oral Oral Oral Oral  SpO2: 97% 99% 96% 96%  Weight: 84.5 kg     Height:       No data found.   Intake/Output Summary (Last 24 hours) at 01/28/2020 1656 Last data filed at 01/28/2020 1340 Gross per 24 hour  Intake 580 ml  Output 3300 ml  Net -2720 ml   Filed Weights   01/27/20 0805 01/27/20 0817 01/28/20 0308  Weight: (!) 139 kg 128.6 kg 84.5 kg    Exam:  GEN: NAD SKIN: Warm and dry EYES: No pallor or  icterus ENT: MMM CV: RRR PULM: CTA B ABD: soft, obese, NT, +BS CNS: AAO x 3, non focal EXT: Left leg is in an immobilizer.  Right leg with mild edema but no tenderness.  Chronic skin changes/brownish discoloration on right leg.   Data Reviewed:   I have personally reviewed following labs and imaging studies:  Labs: Labs show the following:   Basic Metabolic Panel:  Recent Labs  Lab 01/22/20 0350 01/22/20 0350 01/27/20 0811 01/28/20 0536  NA 136  --  139 138  K 4.3   < > 4.8 4.7  CL 103  --  104 103  CO2 25  --  26 28  GLUCOSE 167*  --  182* 153*  BUN 16  --  44* 48*  CREATININE 1.32*  --  2.93* 2.56*  CALCIUM 8.1*  --  8.1* 8.1*  MG 1.9  --   --   --    < > = values in this interval not displayed.   GFR Estimated Creatinine Clearance: 25.2 mL/min (A) (by C-G formula based on SCr of 2.56 mg/dL (H)). Liver Function Tests: Recent Labs  Lab 01/27/20 0811  AST 19  ALT 25  ALKPHOS 99  BILITOT 1.0  PROT 6.5  ALBUMIN 2.5*   No results for input(s): LIPASE, AMYLASE in the last 168 hours. No results for input(s): AMMONIA in the last 168 hours. Coagulation profile Recent Labs  Lab 01/27/20 0811  INR 1.5*    CBC: Recent Labs  Lab 01/21/20 1814 01/22/20 0205 01/22/20 1010 01/27/20 0811  WBC  --   --   --  12.0*  NEUTROABS  --   --   --  10.7*  HGB 7.3* 6.9* 8.8* 8.4*  HCT 22.9* 22.4* 27.4* 27.7*  MCV  --   --   --  89.1  PLT  --   --   --  433*   Cardiac Enzymes: No results for input(s): CKTOTAL, CKMB, CKMBINDEX, TROPONINI in the last 168 hours. BNP (last 3 results) No results for input(s): PROBNP in the last 8760 hours. CBG: Recent Labs  Lab 01/27/20 1813 01/27/20 2131 01/28/20 0757 01/28/20 1156 01/28/20 1636  GLUCAP 157* 233* 141* 153* 178*   D-Dimer: No results for input(s): DDIMER in the last 72 hours. Hgb A1c: No results for input(s): HGBA1C in the last 72 hours. Lipid Profile: No results for input(s): CHOL, HDL, LDLCALC, TRIG, CHOLHDL,  LDLDIRECT in the last 72 hours. Thyroid function studies: No results for input(s): TSH, T4TOTAL, T3FREE, THYROIDAB in the last 72 hours.  Invalid input(s): FREET3 Anemia work up: No results for input(s): VITAMINB12, FOLATE, FERRITIN, TIBC, IRON, RETICCTPCT in the last 72 hours. Sepsis Labs: Recent Labs  Lab 01/27/20 0811  WBC 12.0*    Microbiology Recent Results (from the past 240 hour(s))  Respiratory Panel by RT PCR (Flu A&B, Covid) - Nasopharyngeal Swab     Status: None   Collection Time: 01/21/20  5:14 PM   Specimen: Nasopharyngeal Swab  Result Value Ref Range Status   SARS Coronavirus 2 by RT PCR NEGATIVE NEGATIVE Final    Comment: (NOTE) SARS-CoV-2 target nucleic acids are NOT DETECTED.  The SARS-CoV-2 RNA is generally detectable in upper respiratoy specimens during the acute phase of infection. The lowest concentration of SARS-CoV-2 viral copies this assay can detect is 131 copies/mL. A negative result does not preclude SARS-Cov-2 infection and should not be used as the sole basis for treatment or other patient management decisions. A negative result may occur with  improper specimen collection/handling, submission of specimen other than nasopharyngeal swab, presence of viral mutation(s) within the areas targeted by this assay, and inadequate number of viral copies (<131 copies/mL). A negative result must be combined with clinical observations, patient history, and epidemiological information. The expected result is Negative.  Fact Sheet for Patients:  https://www.moore.com/  Fact Sheet for Healthcare Providers:  https://www.young.biz/  This test  is no t yet approved or cleared by the [place] and  has been authorized for detection and/or diagnosis of SARS-CoV-2 by FDA under an Emergency Use Authorization (EUA). This EUA will remain  in effect (meaning this test can be used) for the duration of the COVID-19  declaration under Section 564(b)(1) of the Act, 21 U.S.C. section 360bbb-3(b)(1), unless the authorization is terminated or revoked sooner.     Influenza A by PCR NEGATIVE NEGATIVE Final   Influenza B by PCR NEGATIVE NEGATIVE Final    Comment: (NOTE) The Xpert Xpress SARS-CoV-2/FLU/RSV assay is intended as an aid in  the diagnosis of influenza from Nasopharyngeal swab specimens and  should not be used as a sole basis for treatment. Nasal washings and  aspirates are unacceptable for Xpert Xpress SARS-CoV-2/FLU/RSV  testing.  Fact Sheet for Patients: https://www.moore.com/  Fact Sheet for Healthcare Providers: https://www.young.biz/  This test is not yet approved or cleared by the [place] FDA and  has been authorized for detection and/or diagnosis of SARS-CoV-2 by  FDA under an Emergency Use Authorization (EUA). This EUA will remain  in effect (meaning this test can be used) for the duration of the  Covid-19 declaration under Section 564(b)(1) of the Act, 21  U.S.C. section 360bbb-3(b)(1), unless the authorization is  terminated or revoked. Performed at [facility], [address] [place], [place] [address]   SARS Coronavirus 2 by RT PCR (hospital order, performed in [facility] hospital lab) Nasopharyngeal Nasopharyngeal Swab     Status: None   Collection Time: 02/26/20  8:11 AM   Specimen: Nasopharyngeal Swab  Result Value Ref Range Status   SARS Coronavirus 2 NEGATIVE NEGATIVE Final    Comment: (NOTE) SARS-CoV-2 target nucleic acids are NOT DETECTED.  The SARS-CoV-2 RNA is generally detectable in upper and lower respiratory specimens during the acute phase of infection. The lowest concentration of SARS-CoV-2 viral copies this assay can detect is 250 copies / mL. A negative result does not preclude SARS-CoV-2 infection and should not be used as the sole basis for treatment or other patient management decisions.  A  negative result may occur with improper specimen collection / handling, submission of specimen other than nasopharyngeal swab, presence of viral mutation(s) within the areas targeted by this assay, and inadequate number of viral copies (<250 copies / mL). A negative result must be combined with clinical observations, patient history, and epidemiological information.  Fact Sheet for Patients:   BoilerBrush.com.cy  Fact Sheet for Healthcare Providers: https://pope.com/  This test is not yet approved or  cleared by the [place] FDA and has been authorized for detection and/or diagnosis of SARS-CoV-2 by FDA under an Emergency Use Authorization (EUA).  This EUA will remain in effect (meaning this test can be used) for the duration of the COVID-19 declaration under Section 564(b)(1) of the Act, 21 U.S.C. section 360bbb-3(b)(1), unless the authorization is terminated or revoked sooner.  Performed at [facility], [address], [place], [place] [address]     Procedures and diagnostic studies:  US RENAL  Result Date: 02-26-2020 CLINICAL DATA:  Acute kidney injury.  Chronic kidney disease. EXAM: RENAL / URINARY TRACT ULTRASOUND COMPLETE COMPARISON:  Abdomen and pelvis CT dated 09/05/2016. FINDINGS: Right Kidney: Renal measurements: 10.8 x 6.1 x 5.9 cm = volume: 201 mL. Echogenicity within normal limits. No mass or hydronephrosis visualized. Left Kidney: Renal measurements: 12.2 x 6.8 x 5.8 cm = volume: 228 mL. Echogenicity within normal limits. No mass  or hydronephrosis visualized. Bladder: Not distended, with a Foley catheter in place. Other: None. IMPRESSION: Normal examination. Electronically Signed   By: Beckie SaltsSteven  Reid M.D.   On: 01/27/2020 17:15   DG Chest Portable 1 View  Result Date: 01/27/2020 CLINICAL DATA:  Shortness of breath. History of diabetes, atrial fibrillation and hypertension. EXAM: PORTABLE CHEST 1 VIEW  COMPARISON:  Radiographs 01/10/2020 and 06/26/2019. FINDINGS: 0814 hours. Two views obtained. There is stable cardiac enlargement post median sternotomy and CABG. There is aortic atherosclerosis. There is new mild pulmonary edema and probable small bilateral pleural effusions. No confluent airspace opacity or pneumothorax. Apparent right arm PICC not visualized beyond the level of the right sternoclavicular joint, likely in the right brachiocephalic vein. No acute osseous findings. IMPRESSION: 1. New mild pulmonary edema and probable small bilateral pleural effusions consistent with congestive heart failure. 2. Apparent right arm PICC tip is likely in the right brachiocephalic vein. Electronically Signed   By: Carey BullocksWilliam  Veazey M.D.   On: 01/27/2020 08:24               LOS: 1 day   Daryl Quiros  [facility]   Pager on [email or web address]. If 7PM-7AM, please contact night-coverage at [email or web address]     01/28/2020, 4:56 PM

## 2020-01-28 NOTE — Progress Notes (Addendum)
Heart Failure Nurse Navigator Note:  Initial visit with patient and his son.  He had been residing in skilled nursing care facility to complete the long term IV antibiotic therapy.  But for two days prior to admission to the hospital he was more SOB, requiring supplemental oxygen.  He also complained of his stomach being bigger and pressure in his chest and abdomen. Also had more of a cough and was worse with lying flat.  He also noted early satiety.  States" he would take a couple bites of food and would instantly feel full."  Chest x-ray revealed pulmonary edema.    When questioned he was not weighing daily and had been adding salt to foods that needed it.  Discussed not using salt, to use a supplement like  Mrs. Dash.  Cormorbidities:  Coronary artery disease with coronary artery bypass grafting.  Atrial fibrillation Diabetes CKD stage III Obesity Hypertension  Medications include:  Lasix 40 mg IV BID Pacerone 200 mg daily ASA 81 mg daily Diltiazem 180 mg daily Spironolactone 25 mg daily  Coreg 6.25 mg daily  Labs:  Sodium 138, potassiuim 4.7, Bun 48, creatinine 2.53 (2.93 on 9/6)  Question if admitting weights are correct as during his last hospitalization documented weights were 127 to 129 KG. Today's weight is 84.5 kg.  Given heart failure teaching booklet and magnet.  Will follow during this hospitalization.   Tresa Endo RN, CHFN  L:

## 2020-01-29 LAB — BASIC METABOLIC PANEL
Anion gap: 9 (ref 5–15)
BUN: 42 mg/dL — ABNORMAL HIGH (ref 8–23)
CO2: 29 mmol/L (ref 22–32)
Calcium: 8.4 mg/dL — ABNORMAL LOW (ref 8.9–10.3)
Chloride: 102 mmol/L (ref 98–111)
Creatinine, Ser: 2.26 mg/dL — ABNORMAL HIGH (ref 0.61–1.24)
GFR calc Af Amer: 31 mL/min — ABNORMAL LOW (ref >60)
GFR calc non Af Amer: 27 mL/min — ABNORMAL LOW (ref >60)
Glucose, Bld: 163 mg/dL — ABNORMAL HIGH (ref 70–99)
Potassium: 4.5 mmol/L (ref 3.5–5.1)
Sodium: 140 mmol/L (ref 135–145)

## 2020-01-29 LAB — GLUCOSE, CAPILLARY
Glucose-Capillary: 171 mg/dL — ABNORMAL HIGH (ref 70–99)
Glucose-Capillary: 223 mg/dL — ABNORMAL HIGH (ref 70–99)
Glucose-Capillary: 145 mg/dL — ABNORMAL HIGH (ref 70–99)
Glucose-Capillary: 202 mg/dL — ABNORMAL HIGH (ref 70–99)

## 2020-01-29 LAB — MRSA PCR SCREENING: MRSA by PCR: NEGATIVE

## 2020-01-29 MED ORDER — GUAIFENESIN ER 600 MG PO TB12
600.0000 mg | ORAL_TABLET | Freq: Two times a day (BID) | ORAL | Status: DC | PRN
  Administered 2020-01-29 – 2020-02-01 (×4): 600 mg via ORAL
  Filled 2020-01-29 (×4): qty 1

## 2020-01-29 MED ORDER — LACTULOSE 10 GM/15ML PO SOLN
20.0000 g | Freq: Two times a day (BID) | ORAL | Status: DC
  Filled 2020-01-29 (×5): qty 30

## 2020-01-29 MED ORDER — FUROSEMIDE 10 MG/ML IJ SOLN
60.0000 mg | Freq: Once | INTRAMUSCULAR | Status: AC
  Administered 2020-01-29: 60 mg via INTRAVENOUS
  Filled 2020-01-29: qty 6

## 2020-01-29 MED ORDER — IPRATROPIUM-ALBUTEROL 0.5-2.5 (3) MG/3ML IN SOLN
3.0000 mL | RESPIRATORY_TRACT | Status: DC
  Administered 2020-01-29 – 2020-01-30 (×4): 3 mL via RESPIRATORY_TRACT
  Filled 2020-01-29 (×5): qty 3

## 2020-01-29 MED ORDER — SODIUM CHLORIDE 0.9 % IV SOLN
2.0000 g | Freq: Four times a day (QID) | INTRAVENOUS | Status: DC
  Administered 2020-01-29 – 2020-02-01 (×12): 2 g via INTRAVENOUS
  Filled 2020-01-29 (×2): qty 2
  Filled 2020-01-29 (×4): qty 2000
  Filled 2020-01-29 (×2): qty 2
  Filled 2020-01-29: qty 2000
  Filled 2020-01-29 (×4): qty 2
  Filled 2020-01-29: qty 2000

## 2020-01-29 NOTE — Progress Notes (Signed)
PT Cancellation Note  Patient Details Name: Randall Vincent MRN: [id number] DOB: [birth year]   Cancelled Treatment:    Reason Eval/Treat Not Completed: Fatigue/lethargy limiting ability to participate.  PT consult received.  Chart reviewed.  Upon PT arrival to pt's room, pt resting in bed.  Pt reporting being "tuckered out" from recent activity (used [facility] with staff assist) and declined physical therapy; pt also appearing SOB at rest.  Nurse then arrived and checked on pt.  Will re-attempt PT evaluation at a later date/time.  Hendricks Limes, PT 01/29/20, 3:53 PM

## 2020-01-29 NOTE — Progress Notes (Signed)
  Subjective:  Patient reports pain as mild.  At LLE heel pressure ulcer only.  Objective:   VITALS:   Vitals:   01/29/20 0757 01/29/20 0824 01/29/20 1229 01/29/20 1535  BP:  (!) 185/70 (!) 132/51 (!) 139/53  Pulse:  73 (!) 57 (!) 59  Resp:  18 19 19   Temp:  97.9 F (36.6 C) 98 F (36.7 C) 98 F (36.7 C)  TempSrc:  Oral Oral Oral  SpO2: 97% 92% 97% 98%  Weight:      Height:        PHYSICAL EXAM:  Neurologically intact ABD soft Neurovascular intact Sensation intact distally Intact pulses distally Dorsiflexion/Plantar flexion intact Incision: no drainage No cellulitis present Compartment soft Dressing changed, staples removed  LABS  Results for orders placed or performed during the hospital encounter of 01/27/20 (from the past 24 hour(s))  Glucose, capillary     Status: Abnormal   Collection Time: 01/28/20  8:29 PM  Result Value Ref Range   Glucose-Capillary 212 (H) 70 - 99 mg/dL  Basic metabolic panel     Status: Abnormal   Collection Time: 01/29/20  5:08 AM  Result Value Ref Range   Sodium 140 135 - 145 mmol/L   Potassium 4.5 3.5 - 5.1 mmol/L   Chloride 102 98 - 111 mmol/L   CO2 29 22 - 32 mmol/L   Glucose, Bld 163 (H) 70 - 99 mg/dL   BUN 42 (H) 8 - 23 mg/dL   Creatinine, Ser 03/30/20 (H) 0.61 - 1.24 mg/dL   Calcium 8.4 (L) 8.9 - 10.3 mg/dL   GFR calc non Af Amer 27 (L) >60 mL/min   GFR calc Af Amer 31 (L) >60 mL/min   Anion gap 9 5 - 15  Glucose, capillary     Status: Abnormal   Collection Time: 01/29/20  8:24 AM  Result Value Ref Range   Glucose-Capillary 171 (H) 70 - 99 mg/dL  Glucose, capillary     Status: Abnormal   Collection Time: 01/29/20 12:28 PM  Result Value Ref Range   Glucose-Capillary 223 (H) 70 - 99 mg/dL    03/30/20 RENAL  Result Date: 01/27/2020 CLINICAL DATA:  Acute kidney injury.  Chronic kidney disease. EXAM: RENAL / URINARY TRACT ULTRASOUND COMPLETE COMPARISON:  Abdomen and pelvis CT dated 09/05/2016. FINDINGS: Right Kidney: Renal  measurements: 10.8 x 6.1 x 5.9 cm = volume: 201 mL. Echogenicity within normal limits. No mass or hydronephrosis visualized. Left Kidney: Renal measurements: 12.2 x 6.8 x 5.8 cm = volume: 228 mL. Echogenicity within normal limits. No mass or hydronephrosis visualized. Bladder: Not distended, with a Foley catheter in place. Other: None. IMPRESSION: Normal examination. Electronically Signed   By: 09/07/2016 M.D.   On: 01/27/2020 17:15    Assessment/Plan:     Principal Problem:   Acute on chronic diastolic CHF (congestive heart failure) (HCC) Active Problems:   Essential hypertension   DM (diabetes mellitus), type 2 (HCC)   Chronic kidney disease, stage 3a   Acute-on-chronic kidney injury (HCC)   A-fib (HCC)   Obesity, Class III, BMI 40-49.9 (morbid obesity) (HCC)   Up with therapy, TDWB when medically stable Staples removed Discharge per medicine Follow up with Dr. 03/28/2020 office in 2-3 weeks, Call office for appt. [phone]   Odis Luster , PA-C 01/29/2020, 4:44 PM

## 2020-01-29 NOTE — TOC Progression Note (Signed)
Transition of Care [facility]) - Progression Note    Patient Details  Name: Randall Vincent MRN: [id number] Date of Birth: 11-30-1940  [facility] CM/SW Contact  Shawn Route, RN Phone Number: 01/29/2020, 2:11 PM  Clinical Narrative:     Bedside nurse reached out and said patient would like to talk to me about his SNF.  Reached out to patient and he is in agreement to return to [facility] as they are giving him his antibiotic q 4 hours until Oct 2nd.  Spoke with Thayer Ohm at [facility] and he has agreed to accept patient back at discharge.  Will start insurance Auth when estimated discharge date is established.         Expected Discharge Plan and Services           Expected Discharge Date: 01/29/20                                     Social Determinants of Health (SDOH) Interventions    Readmission Risk Interventions No flowsheet data found.

## 2020-01-29 NOTE — Progress Notes (Signed)
[facility]  - [place] at [facility]   PATIENT NAME: Randall Vincent    MR#:  [id number]  DATE OF BIRTH:  1941-02-17  SUBJECTIVE:  plaintiff cough and congestion. Still feels short of breath. Has not started physical therapy yet. REVIEW OF SYSTEMS:   Review of Systems  Constitutional: Negative for chills, fever and weight loss.  HENT: Negative for ear discharge, ear pain and nosebleeds.   Eyes: Negative for blurred vision, pain and discharge.  Respiratory: Negative for sputum production, shortness of breath, wheezing and stridor.   Cardiovascular: Negative for chest pain, palpitations, orthopnea and PND.  Gastrointestinal: Negative for abdominal pain, diarrhea, nausea and vomiting.  Genitourinary: Negative for frequency and urgency.       Scrotal edema  Musculoskeletal: Negative for back pain and joint pain.  Neurological: Negative for sensory change, speech change, focal weakness and weakness.  Psychiatric/Behavioral: Negative for depression and hallucinations. The patient is not nervous/anxious.    Tolerating Diet: yes Tolerating PT: recommends rehab (from previous admission)  DRUG ALLERGIES:  No Known Allergies  VITALS:  Blood pressure (!) 139/53, pulse (!) 59, temperature 98 F (36.7 C), temperature source Oral, resp. rate 19, height 5\' 8"  (1.727 m), weight 127.3 kg, SpO2 98 %.  PHYSICAL EXAMINATION:   Physical Exam  GENERAL:  79 y.o.-year-old patient lying in the bed with no acute distress. Obese EYES: Pupils equal, round, reactive to light and accommodation. No scleral icterus.   HEENT: Head atraumatic, normocephalic. Oropharynx and nasopharynx clear.  LUNGS: decreased breath sounds bilaterally, no wheezing, rales, rhonchi. No use of accessory muscles of respiration.  CARDIOVASCULAR: S1, S2 normal. No murmurs, rubs, or gallops.  ABDOMEN: Soft, nontender, nondistended. Bowel sounds present. No organomegaly or mass. Scrotal edema EXTREMITIES:++edema b/l.     NEUROLOGIC: Cranial nerves II through XII are intact. No focal Motor or sensory deficits b/l.   PSYCHIATRIC:  patient is alert and oriented x 3.  SKIN: No obvious rash, lesion, or ulcer.  LABORATORY PANEL:  CBC Recent Labs  Lab 01/27/20 0811  WBC 12.0*  HGB 8.4*  HCT 27.7*  PLT 433*    Chemistries  Recent Labs  Lab 01/27/20 0811 01/28/20 0536 01/29/20 0508  NA 139   < > 140  K 4.8   < > 4.5  CL 104   < > 102  CO2 26   < > 29  GLUCOSE 182*   < > 163*  BUN 44*   < > 42*  CREATININE 2.93*   < > 2.26*  CALCIUM 8.1*   < > 8.4*  AST 19  --   --   ALT 25  --   --   ALKPHOS 99  --   --   BILITOT 1.0  --   --    < > = values in this interval not displayed.   Cardiac Enzymes No results for input(s): TROPONINI in the last 168 hours. RADIOLOGY:  03/30/20 RENAL  Result Date: 01/27/2020 CLINICAL DATA:  Acute kidney injury.  Chronic kidney disease. EXAM: RENAL / URINARY TRACT ULTRASOUND COMPLETE COMPARISON:  Abdomen and pelvis CT dated 09/05/2016. FINDINGS: Right Kidney: Renal measurements: 10.8 x 6.1 x 5.9 cm = volume: 201 mL. Echogenicity within normal limits. No mass or hydronephrosis visualized. Left Kidney: Renal measurements: 12.2 x 6.8 x 5.8 cm = volume: 228 mL. Echogenicity within normal limits. No mass or hydronephrosis visualized. Bladder: Not distended, with a Foley catheter in place. Other: None. IMPRESSION: Normal examination. Electronically Signed  By: Beckie Salts M.D.   On: 01/27/2020 17:15   ASSESSMENT AND PLAN:   Sircharles Holzheimer is a 79 y.o. 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  He was recently discharged from the hospital on 01/22/2020 after hospitalization for acute blood  loss anemia and left septic knee arthritis.  Acute on chronicdiastolic dysfunctionCHF -Continue IV Lasix.  Monitor BMP, daily weight and urine output.   -2D echo on 01/11/2020 showed EF estimated at 50 to 55% -UOP total 6200 cc -wean oxygen to keep sats greater than 92%. Patient recently was discharged on oxygen to the facility. -Incentive spirometer -patient follows with cardiology in [facility].  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  -Diagnosed during his last hospitalization -Operative tissue cultures grew Enterococcus faecalis. -Had surgery 8/26, removed L knee prosthesis and placed antibiotic spacer -Continue IV ampicillin 2 g every 4 hours through 02/22/2020.  CAD, multiple vessel, S/P CABG x 2- -Stable.  Continue statin,carvedilol andaspirin   Atrial fibrillation -Status post cardioversion 10/22/19.  -Currently in sinus rhythm. -Continue amiodarone, carvedilol and diltiazem -Patient was on long-term anticoagulation with Pradaxa as primary prophylaxis for an acute stroke anticoagulation was discontinued following his last hospitalization for acute blood loss anemia related to GI bleed  -Patient declines further anticoagulation therapy and wishes to be placed on only aspirin  Essential hypertension Chronic.  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 cont carbohydrate diet Glycemic control with sliding scale insulin  Acute on [place] kidney injuryIII -This is probably due to cardiorenal syndrome. -  At baseline patient has a serum creatinine of 1.3.   -Creatinine is trending down.  -No acute abnormality on renal ultrasound. -Aldactone is on hold. -Avoid nephrotoxins   Procedures:none Family communication :pt Consults :none CODE STATUS: FULL DVT Prophylaxis :Heparin  Status is:  Inpatient  Remains inpatient appropriate because:IV treatments appropriate due to intensity of illness or inability to take PO and Inpatient level of care appropriate due to severity of illness   Dispo: The patient is from: SNF              Anticipated d/c is to: SNF              Anticipated d/c date is: 2 days              Patient currently is not medically stable to d/c.  Patient is being treated for acute on chronic congestive heart failure diastolic. He still needs 1 to 2 days of IV diuresis.      TOTAL TIME TAKING CARE OF THIS PATIENT: 30 minutes.  >50% time spent on counselling and coordination of care  Note: This dictation was prepared with Dragon dictation along with smaller phrase technology. Any transcriptional errors that result from this process are unintentional.  Enedina Finner M.D    [facility]   CC: Primary care physician; Elspeth Cho., MDPatient ID: Randall Vincent, male   DOB: May 08, 1941, 79 y.o.   MRN: [id number]

## 2020-01-29 NOTE — Progress Notes (Signed)
Reassessed patient breathing.   Patient seemed very SOB again.   Wheezing and rhonchi heard.   Gave patient breathing treatment.     15-20 minutes later patient called out complaining of SOB.   O2 Sats dropped to 77% at 4 L.    O2 was increased to 6L before saturations improved.   Patient was very SOB, diaphoretic, pale with accessory muscle usage.   Patient had to sit on side of bed to be able to breath.   Assessed lungs sounds-  Crackles heard bilateral lower lungs.    Paged MD-   MD ordered 1 time dose of 60 mg lasix in place of 40 mg dose this evening; duoneb changed to scheduled; flutter valve obtain from respiratory.     After Patient's breathing settled down- was able to slowly reduce O2 back to 4 L.   Will continue to monitor

## 2020-01-29 NOTE — Progress Notes (Addendum)
  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  Patient presented from skilled nursing facility after experiencing increase SOB, PND,orthopnea and chest and abdominal pressure.   Comorbidities:  Coronary Artery Disease Diabetes CKD stage III Paroxsymal A Fib Hypertension Obesity    Medications:  Amiodarone 200 mg daily Lipitor 20 mg daily Coreg 6.25 mg BID Diltiazem CD 180 daily Lasix 40 mg IV BID   Intake and output:  In 820 ml Out 4800 ml  Labs:  Sodium 140, potsssium 4.5, BUN 42 and creatinine 2.26 (yesterday was 2.53, baesline is 1.3)   Patient states and son concurs that he is very SOB with minimal activity and is unable to lie flat.   Discussed with patient and son, reinforcing not using salt at the table.  Importance of daily weights and recording.  Also limiting fluid intake to 8-8 ounce cups in a 24 hour period.  Patient states at home he weighs between 245 and 250 #.  By today's weight he is 280#.  By documentation he is down 24 #.  Also offered heart failure videos but son had to leave for work and patient would like to view them with his son, will discuss with patients nurse to see if they could view them this evening when he has returned to the hospital.  Will continue to follow during this hospitalization.  Tresa Endo RN,CHFN

## 2020-01-29 NOTE — Progress Notes (Signed)
Mobility Specialist - Progress Note   01/29/20 1340  Mobility  Activity Transferred to/from [facility]  Level of Assistance Moderate assist, patient does 50-74%  Assistive Device Front wheel walker  Distance Ambulated (ft) 6 ft  Mobility Response Tolerated well  Mobility performed by Mobility specialist  $Mobility charge 1 Mobility    Pre-mobility: 60 HR, 141/55 BP, 97% SpO2 During mobility: 63 HR, 87% SpO2 Post-mobility: 61 HR, 154/52 BP, 99% SpO2   Pt was lying in bed upon arrival utilizing 3L of [place] O2. Pt agreed to session. Pt denied any pain, nausea, or fatigue. Pt did c/o tiredness. Pt was maxA in sit-to-stand, and was able to transfer to [facility] with modA and VCs to use RLE for weight-bearing. Nurse entered room mid-session and assisted with transfer. Upon getting to [facility], pt's O2 desat to 87%. Per nurse approval, mobility increased O2 to 4L and oxygen levels sat at 93% after 1 min. Pt had a small loose BM. Pt transferred back to EOB with modA, where he sat EOB for 5 mins before EOB-supine transfer. VS were taken at EOB, O2 sat at 100% on 4L and O2 was taken back down to 3L. After getting pt back to supine position with maxA, pt c/o SOB, O2 levels desat to 89%. Overall, pt tolerated session well. Pt was left in bed and remains on 4L [place] O2. Nurse was notified of performance.    Filiberto Pinks Mobility Specialist 01/29/20, 2:12 PM'

## 2020-01-29 NOTE — NC FL2 (Signed)
MEDICAID FL2 LEVEL OF CARE SCREENING TOOL     IDENTIFICATION  Patient Name: Randall Vincent Birthdate: [birth year] Sex: male Admission Date (Current Location): 01/27/2020  [employer] and [place] Number:  Chiropodist and Address:  [facility], [address], [place], [place] [address]      Provider Number: [id number]  Attending Physician Name and Address:  Enedina Finner, MD  Relative Name and Phone Number:  Murel Shenberger [phone]    Current Level of Care: SNF Recommended Level of Care: Skilled Nursing Facility Prior Approval Number:    Date Approved/Denied:   PASRR Number: [id number]  Discharge Plan: SNF    Current Diagnoses: Patient Active Problem List   Diagnosis Date Noted  . Acute on chronic diastolic CHF (congestive heart failure) (HCC) 01/27/2020  . Acute-on-chronic kidney injury (HCC) 01/27/2020  . A-fib (HCC)   . Obesity, Class III, BMI 40-49.9 (morbid obesity) (HCC)   . Diverticulosis of colon without hemorrhage   . Intestinal bypass or anastomosis status   . Melena 01/10/2020  . Symptomatic anemia 01/10/2020  . Acute blood loss anemia 01/10/2020  . Suspect Septic arthritis (HCC) 01/10/2020  . Chronic anticoagulation 01/10/2020  . 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 Chronic kidney disease, stage 3a 01/10/2020  . Acute on chronic congestive heart failure (HCC) 01/10/2020  . Elevated troponin 01/10/2020  . Chest pain 06/26/2019  . CAD, multiple vessel 06/26/2019  . 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 Atrial fibrillation status post cardioversion 10/22/19 (HCC) 06/26/2019  . Essential hypertension 06/26/2019  . DM (diabetes mellitus), type 2 (HCC) 06/26/2019  . Anginal equivalent (HCC) 06/26/2019    Orientation RESPIRATION BLADDER Height & Weight     Self, Time, Situation, Place  Normal Continent Weight: 127.3 kg Height:  5\' 8"  (172.7 cm)  BEHAVIORAL SYMPTOMS/MOOD NEUROLOGICAL BOWEL NUTRITION STATUS       Continent Diet  AMBULATORY STATUS COMMUNICATION OF NEEDS Skin   Extensive Assist Verbally Normal                       Personal Care Assistance Level of Assistance  Bathing, Dressing Bathing Assistance: Limited assistance Feeding assistance: Independent Dressing Assistance: Limited assistance     Functional Limitations Info             SPECIAL CARE FACTORS FREQUENCY  PT (By licensed PT), OT (By licensed OT)     PT Frequency: 5x a week OT Frequency: 5x a week            Contractures Contractures Info: Not present    Additional Factors Info  Code Status, Allergies Code Status Info: Full Allergies Info: No Known Drug Allergies           Current Medications (01/29/2020):  This is the current hospital active medication list Current Facility-Administered Medications  Medication Dose Route Frequency Provider Last Rate Last Admin  . 0.9 %  sodium chloride infusion  250 mL Intravenous PRN Agbata, Tochukwu, MD   Stopped at 01/29/20 1311  . acetaminophen (TYLENOL) tablet 500 mg  500 mg Oral Q6H PRN Agbata, Tochukwu, MD      . amiodarone (PACERONE) tablet 200 mg  200 mg Oral Daily Agbata, Tochukwu, MD   200 mg at 01/29/20 0931  . ampicillin (OMNIPEN) 2 g in sodium chloride 0.9 % 100 mL IVPB  2 g Intravenous Q6H 03/30/20, RPH      . aspirin EC tablet 81 mg  81  mg Oral Daily Agbata, Tochukwu, MD   81 mg at 01/29/20 0927  . atorvastatin (LIPITOR) tablet 40 mg  40 mg Oral QPM Agbata, Tochukwu, MD   40 mg at 01/28/20 1859  . carvedilol (COREG) tablet 6.25 mg  6.25 mg Oral BID WC Agbata, Tochukwu, MD   6.25 mg at 01/29/20 0925  . Chlorhexidine Gluconate Cloth 2 % PADS 6 each  6 each Topical Daily Agbata, Tochukwu, MD   6 each at 01/28/20 0849  . diltiazem (CARDIZEM CD) 24 hr capsule 180 mg  180 mg Oral Daily Agbata, Tochukwu, MD   180 mg at 01/29/20 0925  . fluticasone (FLONASE) 50 MCG/ACT nasal spray 2 spray  2 spray Each Nare Daily PRN Agbata, Tochukwu, MD      .  furosemide (LASIX) injection 40 mg  40 mg Intravenous BID Agbata, Tochukwu, MD   40 mg at 01/29/20 0928  . guaiFENesin (MUCINEX) 12 hr tablet 600 mg  600 mg Oral BID PRN Enedina Finner, MD   600 mg at 01/29/20 0927  . heparin injection 5,000 Units  5,000 Units Subcutaneous Q8H Agbata, Tochukwu, MD   5,000 Units at 01/29/20 1307  . insulin aspart (novoLOG) injection 0-15 Units  0-15 Units Subcutaneous TID WC Agbata, Tochukwu, MD   5 Units at 01/29/20 1308  . ipratropium-albuterol (DUONEB) 0.5-2.5 (3) MG/3ML nebulizer solution 3 mL  3 mL Nebulization Q6H PRN Agbata, Tochukwu, MD   3 mL at 01/29/20 0757  . lactulose (CHRONULAC) 10 GM/15ML solution 20 g  20 g Oral BID Enedina Finner, MD      . mupirocin ointment (BACTROBAN) 2 % 1 application  1 application Topical Daily PRN Agbata, Tochukwu, MD      . ondansetron (ZOFRAN) injection 4 mg  4 mg Intravenous Q6H PRN Agbata, Tochukwu, MD      . polyethylene glycol (MIRALAX / GLYCOLAX) packet 17 g  17 g Oral Daily PRN Agbata, Tochukwu, MD   17 g at 01/29/20 0928  . sodium chloride flush (NS) 0.9 % injection 10-40 mL  10-40 mL Intracatheter Q12H Agbata, Tochukwu, MD   10 mL at 01/29/20 0932  . sodium chloride flush (NS) 0.9 % injection 10-40 mL  10-40 mL Intracatheter PRN Agbata, Tochukwu, MD      . sodium chloride flush (NS) 0.9 % injection 3 mL  3 mL Intravenous Q12H Agbata, Tochukwu, MD      . sodium chloride flush (NS) 0.9 % injection 3 mL  3 mL Intravenous PRN Agbata, Tochukwu, MD      . traMADol (ULTRAM) tablet 50 mg  50 mg Oral Q6H PRN Agbata, Tochukwu, MD      . vitamin B-12 (CYANOCOBALAMIN) tablet 1,000 mcg  1,000 mcg Oral Daily Agbata, Tochukwu, MD   1,000 mcg at 01/29/20 0867     Discharge Medications: Please see discharge summary for a list of discharge medications.  Relevant Imaging Results:  Relevant Lab Results:   Additional Information [id number]  Shawn Route, RN

## 2020-01-29 NOTE — Progress Notes (Signed)
Patient ambulated to bedside commode with Mobility tech.   Patient desats to 89% on 3 L O2.   Increased O2 to 4 L.   O2 improved to 95%.   Patient complained of worsening SOB while ambulating.     MD aware

## 2020-01-30 DIAGNOSIS — L8990 Pressure ulcer of unspecified site, unspecified stage: Secondary | ICD-10-CM | POA: Insufficient documentation

## 2020-01-30 LAB — BASIC METABOLIC PANEL
Anion gap: 8 (ref 5–15)
BUN: 40 mg/dL — ABNORMAL HIGH (ref 8–23)
CO2: 32 mmol/L (ref 22–32)
Calcium: 8.4 mg/dL — ABNORMAL LOW (ref 8.9–10.3)
Chloride: 101 mmol/L (ref 98–111)
Creatinine, Ser: 1.87 mg/dL — ABNORMAL HIGH (ref 0.61–1.24)
GFR calc Af Amer: 39 mL/min — ABNORMAL LOW (ref >60)
GFR calc non Af Amer: 34 mL/min — ABNORMAL LOW (ref >60)
Glucose, Bld: 169 mg/dL — ABNORMAL HIGH (ref 70–99)
Potassium: 4.4 mmol/L (ref 3.5–5.1)
Sodium: 141 mmol/L (ref 135–145)

## 2020-01-30 LAB — GLUCOSE, CAPILLARY
Glucose-Capillary: 143 mg/dL — ABNORMAL HIGH (ref 70–99)
Glucose-Capillary: 211 mg/dL — ABNORMAL HIGH (ref 70–99)
Glucose-Capillary: 187 mg/dL — ABNORMAL HIGH (ref 70–99)

## 2020-01-30 MED ORDER — IPRATROPIUM-ALBUTEROL 0.5-2.5 (3) MG/3ML IN SOLN
3.0000 mL | Freq: Three times a day (TID) | RESPIRATORY_TRACT | Status: DC
  Administered 2020-01-30 – 2020-02-01 (×5): 3 mL via RESPIRATORY_TRACT
  Filled 2020-01-30 (×7): qty 3

## 2020-01-30 MED ORDER — RAMIPRIL 10 MG PO CAPS
10.0000 mg | ORAL_CAPSULE | Freq: Every day | ORAL | Status: DC
  Administered 2020-01-30 – 2020-02-01 (×3): 10 mg via ORAL
  Filled 2020-01-30 (×3): qty 1

## 2020-01-30 NOTE — Plan of Care (Signed)
  Problem: Education: Goal: Knowledge of General Education information will improve Description Including pain rating scale, medication(s)/side effects and non-pharmacologic comfort measures Outcome: Progressing   

## 2020-01-30 NOTE — Progress Notes (Signed)
Mobility Specialist - Progress Note   01/30/20 1652  Mobility  Activity  (Bed exercises)  Range of Motion/Exercises Right leg;Left leg  Level of Assistance Minimal assist, patient does 75% or more  Distance Ambulated (ft) 0 ft  Mobility Response Tolerated well  Mobility performed by Mobility specialist  $Mobility charge 1 Mobility    Pre-mobility: 59 HR, 136/57 BP, 99% SpO2 Post-mobility: 60 HR, 146/61 BP, 99% SpO2   Pt was lying in bed upon arrival. Pt agreed to session. Pt washed face, mobility tech assisted with set up. Pt was able to perform bed exercise independently on R LE: ankle pumps, quad sets, straight leg raises, hip abd/add. Pt performed bed exercises on L LE with minA: straight leg raises, and hip abd/add. Overall, pt tolerated session well. Pt remains in bed with phone/call bell in reach.    Filiberto Pinks Mobility Specialist 01/30/20, 4:56 PM

## 2020-01-30 NOTE — Progress Notes (Signed)
  Heart Failure Nurse Navigator Note  HFpEF-EF 50-55%  Patient presented from skilled facility with complaints of increasing SOB, abdominal enlargement, lower extremity edema, chest and abdominal pressure.   Comorbidities:  Coronary artery disease Atrial fibrillation Diabetes CKD stage III Obesity Hypertension  Medications:  Lasix 40 mg IV q 12 hours Amiodarone 200 mg daily ASA 81 mg daily Diltiazem 180 mg Coreg 6.25 mg BID Spironolactone 25 mg daily is on hold  Labs:  Sodium 141, potassium 4.4, BUN 40, creatinine 1.87( yesterday 2.26)   Weight is down 2 kg from yesterday's weight, today 125.4.  He states that is SOB is much improved and feels that the edema is improving also.   Unable to locate iPad for viewing videos on heart failure.  Patient and son did read heart failure teaching booklet and today reviewed information on magnet.  Will continue to follow through this hospitalization.    Tresa Endo RN, CHFN

## 2020-01-30 NOTE — Evaluation (Signed)
Physical Therapy Evaluation Patient Details Name: Randall Vincent MRN: [id number] DOB: [birth year] Today's Date: 01/30/2020   History of Present Illness  Pt is a 79 y.o. male presenting to hospital from [facility] 9/6 with SOB x2 days.  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  Pt now admitted with acute on chronic diastolic CHF.  PMH includes CAD, CHF, htn, a-fib, DM, h/o B TKR.  Heel pressure ulcer on L noted in chart review.  Clinical Impression  Prior to hospital admission, pt was at rehab receiving physical therapy.  Currently pt is mod to max assist with bed mobility and pt unable to stand from elevated bed with 1 assist and use of walker.  Pt requiring intermittent rest breaks with each activity d/t SOB (pt's O2 sats 92% or greater on 3 L O2 via nasal cannula during sessions activities).  Pt would benefit from skilled PT to address noted impairments and functional limitations (see below for any additional details).  Upon hospital discharge, pt would benefit from STR.    Follow Up Recommendations SNF    Equipment Recommendations   (TBD at next facility)    Recommendations for Other Services OT consult     Precautions / Restrictions Precautions Precautions: Fall Precaution Comments: High Fall Risk; R PICC Required Braces or Orthoses: Knee Immobilizer - Left Knee Immobilizer - Left: On at all times Restrictions Weight Bearing Restrictions: Yes LLE Weight Bearing: Non weight bearing      Mobility  Bed Mobility Overal bed mobility: Needs Assistance Bed Mobility: Supine to Sit;Sit to Supine     Supine to sit: Mod assist;HOB elevated;Max assist (assist for L LE and trunk) Sit to supine: Mod assist;Max assist (assist for B LE's and trunk)   General bed mobility comments: vc's for technique  Transfers Overall transfer level: Needs  assistance Equipment used: Rolling walker (2 wheeled) Transfers: Sit to/from Stand Sit to Stand: From elevated surface         General transfer comment: pt unable to stand up to RW with bed height elevated, vc's for technique, and max assist of therapist (x2 trials)  Ambulation/Gait             General Gait Details: unable to stand to attempt  Stairs            Wheelchair Mobility    Modified Rankin (Stroke Patients Only)       Balance Overall balance assessment: Needs assistance Sitting-balance support: No upper extremity supported;Feet supported Sitting balance-Leahy Scale: Good Sitting balance - Comments: steady sitting reaching within BOS                                     Pertinent Vitals/Pain Pain Assessment: 0-10 Faces Pain Scale: Hurts a little bit Pain Location: L knee burning where staples were removed yesterday Pain Descriptors / Indicators: Burning Pain Intervention(s): Limited activity within patient's tolerance;Monitored during session;Repositioned  HR WFL during sessions activities.    Home Living Family/patient expects to be discharged to:: Skilled nursing facility Living Arrangements: Alone Available Help at Discharge: Family;Available PRN/intermittently Type of Home:  (Camper in son's yard) Home Access: Stairs to enter Entrance Stairs-Rails: Right;Left;Can reach both Entrance Stairs-Number of Steps: 6 Home Layout: One level Home Equipment: Environmental consultant - 2 wheels;Cane - single point;Adaptive equipment  Prior Function Level of Independence: Independent with assistive device(s)         Comments: Pt using SPC or RW for functional mobility prior to recent hospitalizations.     Hand Dominance        Extremity/Trunk Assessment   Upper Extremity Assessment Upper Extremity Assessment: Generalized weakness    Lower Extremity Assessment Lower Extremity Assessment: Generalized weakness;LLE deficits/detail (L LE in  knee immobilizer) LLE Deficits / Details: L LE in knee immobilizer LLE: Unable to fully assess due to immobilization    Cervical / Trunk Assessment Cervical / Trunk Assessment: Normal  Communication   Communication: No difficulties  Cognition Arousal/Alertness: Awake/alert Behavior During Therapy: WFL for tasks assessed/performed Overall Cognitive Status: Within Functional Limits for tasks assessed                                        General Comments   Nursing cleared pt for participation in physical therapy.  Pt agreeable to PT session.    Exercises  Transfer training   Assessment/Plan    PT Assessment Patient needs continued PT services  PT Problem List Decreased strength;Decreased activity tolerance;Decreased range of motion;Decreased balance;Decreased mobility;Decreased knowledge of use of DME;Cardiopulmonary status limiting activity;Pain;Decreased knowledge of precautions       PT Treatment Interventions DME instruction;Gait training;Functional mobility training;Therapeutic activities;Therapeutic exercise;Balance training;Patient/family education    PT Goals (Current goals can be found in the Care Plan section)  Acute Rehab PT Goals Patient Stated Goal: to go to rehab and then go home PT Goal Formulation: With patient Time For Goal Achievement: 02/13/20 Potential to Achieve Goals: Good    Frequency Min 2X/week   Barriers to discharge Decreased caregiver support;Inaccessible home environment      Co-evaluation               AM-PAC PT "6 Clicks" Mobility  Outcome Measure Help needed turning from your back to your side while in a flat bed without using bedrails?: A Lot Help needed moving from lying on your back to sitting on the side of a flat bed without using bedrails?: A Lot Help needed moving to and from a bed to a chair (including a wheelchair)?: Total Help needed standing up from a chair using your arms (e.g., wheelchair or bedside  chair)?: Total Help needed to walk in hospital room?: Total Help needed climbing 3-5 steps with a railing? : Total 6 Click Score: 8    End of Session Equipment Utilized During Treatment: Gait belt Activity Tolerance: Patient limited by fatigue Patient left: in bed;with call bell/phone within reach;with bed alarm set;Other (comment) (L LE elevated via pillow with heel floating) Nurse Communication: Mobility status;Precautions;Weight bearing status PT Visit Diagnosis: Other abnormalities of gait and mobility (R26.89);Muscle weakness (generalized) (M62.81);Difficulty in walking, not elsewhere classified (R26.2);Pain Pain - Right/Left: Left Pain - part of body: Knee    Time: 1246-1320 PT Time Calculation (min) (ACUTE ONLY): 34 min   Charges:   PT Evaluation $PT Eval Low Complexity: 1 Low PT Treatments $Therapeutic Activity: 8-22 mins       Hendricks Limes, PT 01/30/20, 4:19 PM

## 2020-01-30 NOTE — Care Management Important Message (Signed)
Important Message  Patient Details  Name: Randall Vincent MRN: [id number] Date of Birth: [birth year]   Medicare Important Message Given:  Yes     [name] 01/30/2020, 2:55 PM

## 2020-01-30 NOTE — Progress Notes (Signed)
[facility]  - [place] [facility]   PATIENT NAME: Randall Vincent    MR#:  [id number]  DATE OF BIRTH:  [birth year]  SUBJECTIVE:  Feels better than yday Good UOP  REVIEW OF SYSTEMS:   Review of Systems  Constitutional: Negative for chills, fever and weight loss.  HENT: Negative for ear discharge, ear pain and nosebleeds.   Eyes: Negative for blurred vision, pain and discharge.  Respiratory: Negative for sputum production, shortness of breath, wheezing and stridor.   Cardiovascular: Negative for chest pain, palpitations, orthopnea and PND.  Gastrointestinal: Negative for abdominal pain, diarrhea, nausea and vomiting.  Genitourinary: Negative for frequency and urgency.        Mild Scrotal edema  Musculoskeletal: Negative for back pain and joint pain.  Neurological: Negative for sensory change, speech change, focal weakness and weakness.  Psychiatric/Behavioral: Negative for depression and hallucinations. The patient is not nervous/anxious.    Tolerating Diet: yes Tolerating PT: recommends rehab (from previous admission)  DRUG ALLERGIES:  No Known Allergies  VITALS:  Blood pressure (!) 155/63, pulse 62, temperature 98.1 F (36.7 C), resp. rate 18, height 5\' 8"  (1.727 m), weight 125.4 kg, SpO2 99 %.  PHYSICAL EXAMINATION:   Physical Exam  GENERAL:  79 y.o.-year-old patient lying in the bed with no acute distress. Obese EYES: Pupils equal, round, reactive to light and accommodation. No scleral icterus.   HEENT: Head atraumatic, normocephalic. Oropharynx and nasopharynx clear.  LUNGS: decreased breath sounds bilaterally, no wheezing, rales, rhonchi. No use of accessory muscles of respiration.  CARDIOVASCULAR: S1, S2 normal. No murmurs, rubs, or gallops.  ABDOMEN: Soft, nontender, nondistended. Bowel sounds present. No organomegaly or mass. + Scrotal edema EXTREMITIES: +edema b/l.    NEUROLOGIC: Cranial nerves II through XII are intact. No focal Motor or sensory  deficits b/l.   PSYCHIATRIC:  patient is alert and oriented x 3.  SKIN: No obvious rash, lesion, or ulcer.  LABORATORY PANEL:  CBC Recent Labs  Lab 01/27/20 0811  WBC 12.0*  HGB 8.4*  HCT 27.7*  PLT 433*    Chemistries  Recent Labs  Lab 01/27/20 0811 01/28/20 0536 01/30/20 0503  NA 139   < > 141  K 4.8   < > 4.4  CL 104   < > 101  CO2 26   < > 32  GLUCOSE 182*   < > 169*  BUN 44*   < > 40*  CREATININE 2.93*   < > 1.87*  CALCIUM 8.1*   < > 8.4*  AST 19  --   --   ALT 25  --   --   ALKPHOS 99  --   --   BILITOT 1.0  --   --    < > = values in this interval not displayed.   Cardiac Enzymes No results for input(s): TROPONINI in the last 168 hours. RADIOLOGY:  No results found. ASSESSMENT AND PLAN:   Randall Vincent is a 79 y.o. 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  He was recently discharged from the hospital on 01/22/2020 after hospitalization for acute blood loss anemia and left septic knee arthritis.  Acute on chronicdiastolic dysfunctionCHF -Continue IV Lasix.  Monitor BMP, daily weight and urine output.   -2D echo  on 01/11/2020 showed EF estimated at 50 to 55% -UOP total 7800 cc, weight trending down -wean oxygen to keep sats greater than 92%. Patient recently was discharged on oxygen to the facility. -Incentive spirometer -patient follows with cardiology in [facility].  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  -Diagnosed during his last hospitalization -Operative tissue cultures grew Enterococcus faecalis. -Had surgery 8/26, removed L knee prosthesis and placed antibiotic spacer -Continue IV ampicillin 2 g every 4  hours through 02/22/2020.  CAD, multiple vessel, S/P CABG x 2 -Stable.  Continue statin,carvedilol andaspirin   Atrial fibrillation -Status post cardioversion 10/22/19.  -Currently in sinus rhythm. -Continue amiodarone, carvedilol and diltiazem -Patient was on long-term anticoagulation with Pradaxa as primary prophylaxis for an acute stroke anticoagulation was discontinued following his last hospitalization for acute blood loss anemia related to GI bleed  -Patient declines further anticoagulation therapy and wishes to be placed on only aspirin  Essential hypertension Chronic.  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 cont carbohydrate diet Glycemic control with sliding scale insulin  Acute on Chronic kidney injuryIII -This is probably due to cardiorenal syndrome. - At baseline patient has a serum creatinine of 1.3.   -Creatinine is trending down to 1.8 -No acute abnormality on renal ultrasound. -Aldactone is on hold. -Avoid nephrotoxins   Procedures:none Family communication :pt Consults :none CODE STATUS: FULL DVT Prophylaxis :Heparin  Status is: Inpatient  Remains inpatient appropriate because:IV treatments appropriate due to intensity of illness or inability to take PO and Inpatient level of care appropriate due to severity of illness   Dispo: The patient is from: SNF              Anticipated d/c is to: SNF              Anticipated d/c date is: 2 days              Patient currently is not medically stable to d/c.  Patient is being treated for acute on chronic congestive heart failure diastolic. He still needs 1 more day  IV diuresis.      TOTAL TIME TAKING CARE OF THIS PATIENT: 25 minutes.  >50% time spent on counselling and coordination of care  Note: This dictation was prepared with Dragon dictation along with smaller phrase  technology. Any transcriptional errors that result from this process are unintentional.  Enedina Finner M.D    [facility]   CC: Primary care physician; Elspeth Cho., MDPatient ID: Randall Vincent, male   DOB: 1941/01/04, 79 y.o.   MRN: [id number]

## 2020-01-31 LAB — BASIC METABOLIC PANEL
Anion gap: 8 (ref 5–15)
BUN: 35 mg/dL — ABNORMAL HIGH (ref 8–23)
CO2: 33 mmol/L — ABNORMAL HIGH (ref 22–32)
Calcium: 8.2 mg/dL — ABNORMAL LOW (ref 8.9–10.3)
Chloride: 100 mmol/L (ref 98–111)
Creatinine, Ser: 1.73 mg/dL — ABNORMAL HIGH (ref 0.61–1.24)
GFR calc Af Amer: 43 mL/min — ABNORMAL LOW (ref >60)
GFR calc non Af Amer: 37 mL/min — ABNORMAL LOW (ref >60)
Glucose, Bld: 168 mg/dL — ABNORMAL HIGH (ref 70–99)
Potassium: 4.5 mmol/L (ref 3.5–5.1)
Sodium: 141 mmol/L (ref 135–145)

## 2020-01-31 LAB — GLUCOSE, CAPILLARY
Glucose-Capillary: 149 mg/dL — ABNORMAL HIGH (ref 70–99)
Glucose-Capillary: 150 mg/dL — ABNORMAL HIGH (ref 70–99)
Glucose-Capillary: 157 mg/dL — ABNORMAL HIGH (ref 70–99)
Glucose-Capillary: 144 mg/dL — ABNORMAL HIGH (ref 70–99)

## 2020-01-31 LAB — SARS CORONAVIRUS 2 BY RT PCR (HOSPITAL ORDER, PERFORMED IN ~~LOC~~ HOSPITAL LAB): SARS Coronavirus 2: NEGATIVE

## 2020-01-31 MED ORDER — GUAIFENESIN ER 600 MG PO TB12: 600.0000 mg | ORAL_TABLET | Freq: Two times a day (BID) | ORAL | 0 refills | Status: DC | PRN

## 2020-01-31 MED ORDER — AMPICILLIN IV (FOR PTA / DISCHARGE USE ONLY): 2.0000 g | Freq: Four times a day (QID) | INTRAVENOUS | 0 refills | Status: DC

## 2020-01-31 MED ORDER — FUROSEMIDE 40 MG PO TABS
40.0000 mg | ORAL_TABLET | Freq: Two times a day (BID) | ORAL | Status: DC
  Administered 2020-01-31 – 2020-02-01 (×3): 40 mg via ORAL
  Filled 2020-01-31 (×3): qty 1

## 2020-01-31 MED ORDER — TRAMADOL HCL 50 MG PO TABS
50.0000 mg | ORAL_TABLET | Freq: Three times a day (TID) | ORAL | 0 refills | Status: DC | PRN
Start: 2020-01-31 — End: 2021-12-24

## 2020-01-31 MED ORDER — FUROSEMIDE 40 MG PO TABS: 40.0000 mg | ORAL_TABLET | Freq: Every day | ORAL | 1 refills | Status: DC

## 2020-01-31 MED ORDER — RAMIPRIL 10 MG PO CAPS: 10.0000 mg | ORAL_CAPSULE | Freq: Every day | ORAL | 1 refills | Status: DC

## 2020-01-31 NOTE — Progress Notes (Signed)
Patient ID: Randall Vincent, male   DOB: 13-Mar-1941, 79 y.o.   MRN: [id number]  PER TOC they do not have bed today for pt. Confirmed bed availability for  9/11 at 1 pm.  COVID ordered.

## 2020-01-31 NOTE — Discharge Summary (Addendum)
[facility] - [place] [facility]   PATIENT NAME: Randall Vincent    MR#:  [id number]  DATE OF BIRTH:  1940/10/29  DATE OF ADMISSION:  01/27/2020 ADMITTING PHYSICIAN: Lucile Shutters, MD  DATE OF DISCHARGE: 01/31/2020  PRIMARY CARE PHYSICIAN: Elspeth Cho., MD    ADMISSION DIAGNOSIS:  AKI (acute kidney injury) (HCC) [N17.9] Acute on chronic diastolic CHF (congestive heart failure) (HCC) [I50.33] Acute on chronic respiratory failure with hypoxia (HCC) [J96.21] Acute on chronic congestive heart failure, unspecified heart failure type (HCC) [I50.9]  DISCHARGE DIAGNOSIS:  Acute on Chronic respiratory with hypoxia due to CHF-diastolic Left Knee septic arthritis--on IV abxs till 02/22/2020  SECONDARY DIAGNOSIS:   Past Medical History:  Diagnosis Date  . A-fib (HCC)   . Diabetes (HCC)   . Hyperlipidemia   . 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  Acute on chronicdiastolic dysfunctionCHF -change to po lasix 40 mg qd.Monitor BMP, daily weight and urine output.  -2D echo on 01/11/2020 showed EF estimated at 50 to 55% -UOP total 9200 cc, weight trending down -wean oxygen to keep sats greater than 92%. Patient recently was discharged on oxygen to the facility. -Incentive spirometer -patient follows with cardiology in [facility]  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  -Diagnosed during his last  hospitalization -Operative tissue cultures grew Enterococcus faecalis. -Had surgery 8/26, removed L knee prosthesis and placed antibiotic spacer -Continue IV ampicillin 2 g every 6 hours through 02/22/2020--renally adjusted dosing per pharmacy  CAD, multiple vessel, S/P CABG x 2 -Stable.  -Continue statin,carvedilol andaspirin  -added ramipril -resumed aldactone  Atrial fibrillation -Status post cardioversion 10/22/19.  -Currently in sinus rhythm. -Continue amiodarone, carvedilol and diltiazem -Patient was on long-term anticoagulation with Pradaxa as primary prophylaxis for an acute stroke anticoagulation was discontinued following his last hospitalization for acute blood loss anemia related to GI bleed  -Patient declines further anticoagulation therapy and wishes to be placed on only aspirin  Essential hypertension Chronic.  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 cont carbohydrate diet, home meds Glycemic control with sliding scale insulin  Acute on Chronic kidney injuryIII -This is probably due to cardiorenal syndrome. -At baseline patient has a serum creatinine of 1.3. -Creatinine is trending down to 1.8 -No acute abnormality on renal ultrasound. -Aldactone is now resumed -Avoid nephrotoxins   Procedures:none Family communication :pt Consults :none CODE STATUS: FULL DVT Prophylaxis :Heparin  Status is: Inpatient  Dispo: The patient is from: SNF  Anticipated d/c is to: [facility]  Anticipated d/c date is: today  Patient currently is medically best at near baseline stable to d/c.   CONSULTS OBTAINED:  Treatment Team:  Lyndle Herrlich, MD  DRUG ALLERGIES:  No Known Allergies  DISCHARGE MEDICATIONS:   Allergies as of 01/31/2020   No Known Allergies     Medication List    STOP  taking these medications   dabigatran 75 MG Caps capsule Commonly known as: PRADAXA   polyethylene glycol 17 g  packet Commonly known as: MIRALAX / GLYCOLAX   sildenafil 100 MG tablet Commonly known as: VIAGRA     TAKE these medications   acetaminophen 500 MG tablet Commonly known as: TYLENOL Take 500-1,000 mg by mouth every 6 (six) hours as needed for mild pain or fever.   albuterol 108 (90 Base) MCG/ACT inhaler Commonly known as: VENTOLIN HFA Inhale 2 puffs into the lungs every 6 (six) hours as needed for wheezing.   amiodarone 200 MG tablet Commonly known as: PACERONE Take 200 mg by mouth daily.   ampicillin  IVPB Inject 2 g into the vein every 6 (six) hours. Indication:  E. Faecalis PJI of knee First Dose: No Last Day of Therapy:  02/22/2020 Labs - Once weekly:  CBC/D, CMP, CRP Method of administration: Ambulatory Pump (Continuous Infusion) Method of administration may be changed at the discretion of home infusion pharmacist based upon assessment of the patient and/or caregiver's ability to self-administer the medication ordered. What changed:   when to take this  additional instructions   aspirin EC 81 MG tablet Take 1 tablet (81 mg total) by mouth daily. Swallow whole.   atorvastatin 40 MG tablet Commonly known as: LIPITOR Take 40 mg by mouth every evening.   carvedilol 6.25 MG tablet Commonly known as: COREG Take 1 tablet (6.25 mg total) by mouth 2 (two) times daily with a meal.   diltiazem 180 MG 24 hr capsule Commonly known as: Cardizem CD Take 1 capsule (180 mg total) by mouth daily.   fluticasone 50 MCG/ACT nasal spray Commonly known as: FLONASE Place 2 sprays into both nostrils daily as needed for allergies or rhinitis.   furosemide 40 MG tablet Commonly known as: LASIX Take 1 tablet (40 mg total) by mouth daily. What changed:   medication strength  how much to take   guaiFENesin 600 MG 12 hr tablet Commonly known as: MUCINEX Take 1 tablet  (600 mg total) by mouth 2 (two) times daily as needed for to loosen phlegm.   mupirocin ointment 2 % Commonly known as: BACTROBAN Apply 1 application topically daily as needed (leg sores).   ramipril 10 MG capsule Commonly known as: ALTACE Take 1 capsule (10 mg total) by mouth daily.   Semaglutide (1 MG/DOSE) 2 MG/1.5ML Sopn Inject 1 mg into the skin every Thursday.   spironolactone 25 MG tablet Commonly known as: ALDACTONE Take 25 mg by mouth daily.   traMADol 50 MG tablet Commonly known as: ULTRAM Take 1 tablet (50 mg total) by mouth every 8 (eight) hours as needed for moderate pain or severe pain. What changed:   when to take this  reasons to take this   vitamin B-12 1000 MCG tablet Commonly known as: CYANOCOBALAMIN Take 1,000 mcg by mouth daily.            Discharge Care Instructions  (From admission, onward)         Start     Ordered   01/31/20 0000  Discharge wound care:       Comments: Foam padding on left heel stage 1   01/31/20 0756   01/31/20 0000  Change dressing on IV access line weekly and PRN  (Home infusion instructions - [facility] )        01/31/20 0908          If you experience worsening of your admission symptoms, develop shortness of breath, life threatening emergency, suicidal or homicidal thoughts you must seek medical attention immediately by calling  911 or calling your MD immediately  if symptoms less severe.  You Must read complete instructions/literature along with all the possible adverse reactions/side effects for all the Medicines you take and that have been prescribed to you. Take any new Medicines after you have completely understood and accept all the possible adverse reactions/side effects.   Please note  You were cared for by a hospitalist during your hospital stay. If you have any questions about your discharge medications or the care you received while you were in the hospital after you are discharged, you can  call the unit and asked to speak with the hospitalist on call if the hospitalist that took care of you is not available. Once you are discharged, your primary care physician will handle any further medical issues. Please note that NO REFILLS for any discharge medications will be authorized once you are discharged, as it is imperative that you return to your primary care physician (or establish a relationship with a primary care physician if you do not have one) for your aftercare needs so that they can reassess your need for medications and monitor your lab values. Today   SUBJECTIVE   Feels better Now on 2L [place]--sats 100%  VITAL SIGNS:  Blood pressure (!) 152/57, pulse 64, temperature 97.9 F (36.6 C), resp. rate 19, height 5\' 8"  (1.727 m), weight 124.8 kg, SpO2 100 %.  I/O:    Intake/Output Summary (Last 24 hours) at 01/31/2020 0909 Last data filed at 01/31/2020 0129 Gross per 24 hour  Intake 720 ml  Output 1475 ml  Net -755 ml    PHYSICAL EXAMINATION:  GENERAL:  79 y.o.-year-old patient lying in the bed with no acute distress. Obese EYES: Pupils equal, round, reactive to light and accommodation. No scleral icterus.   HEENT: Head atraumatic, normocephalic. Oropharynx and nasopharynx clear.  LUNGS: decreased breath sounds bilaterally, no wheezing, rales, rhonchi. No use of accessory muscles of respiration.  CARDIOVASCULAR: S1, S2 normal. No murmurs, rubs, or gallops.  ABDOMEN: Soft, nontender, nondistended. Bowel sounds present. No organomegaly or mass. + Scrotal edema EXTREMITIES: +edema b/l.  Left knee immobiliser   NEUROLOGIC: Cranial nerves II through XII are intact. No focal Motor or sensory deficits b/l.   PSYCHIATRIC:  patient is alert and oriented x 3.  SKIN: Pressure Injury 01/29/20 Heel Left Stage 1 -  Intact skin with non-blanchable redness of a localized area usually over a bony prominence. due to knee mobilizer (Active)  01/29/20 0930  Location: Heel  Location  Orientation: Left  Staging: Stage 1 -  Intact skin with non-blanchable redness of a localized area usually over a bony prominence.  Wound Description (Comments): due to knee mobilizer  Present on Admission: Yes        DATA REVIEW:   CBC  Recent Labs  Lab 01/27/20 0811  WBC 12.0*  HGB 8.4*  HCT 27.7*  PLT 433*    Chemistries  Recent Labs  Lab 01/27/20 0811 01/28/20 0536 01/31/20 0644  NA 139   < > 141  K 4.8   < > 4.5  CL 104   < > 100  CO2 26   < > 33*  GLUCOSE 182*   < > 168*  BUN 44*   < > 35*  CREATININE 2.93*   < > 1.73*  CALCIUM 8.1*   < > 8.2*  AST 19  --   --   ALT 25  --   --   ALKPHOS 99  --   --  BILITOT 1.0  --   --    < > = values in this interval not displayed.    Microbiology Results   Recent Results (from the past 240 hour(s))  Respiratory Panel by RT PCR (Flu A&B, Covid) - Nasopharyngeal Swab     Status: None   Collection Time: 01/21/20  5:14 PM   Specimen: Nasopharyngeal Swab  Result Value Ref Range Status   SARS Coronavirus 2 by RT PCR NEGATIVE NEGATIVE Final    Comment: (NOTE) SARS-CoV-2 target nucleic acids are NOT DETECTED.  The SARS-CoV-2 RNA is generally detectable in upper respiratoy specimens during the acute phase of infection. The lowest concentration of SARS-CoV-2 viral copies this assay can detect is 131 copies/mL. A negative result does not preclude SARS-Cov-2 infection and should not be used as the sole basis for treatment or other patient management decisions. A negative result may occur with  improper specimen collection/handling, submission of specimen other than nasopharyngeal swab, presence of viral mutation(s) within the areas targeted by this assay, and inadequate number of viral copies (<131 copies/mL). A negative result must be combined with clinical observations, patient history, and epidemiological information. The expected result is Negative.  Fact Sheet for Patients:   https://www.moore.com/  Fact Sheet for Healthcare Providers:  https://www.young.biz/  This test is no t yet approved or cleared by the [place] FDA and  has been authorized for detection and/or diagnosis of SARS-CoV-2 by FDA under an Emergency Use Authorization (EUA). This EUA will remain  in effect (meaning this test can be used) for the duration of the COVID-19 declaration under Section 564(b)(1) of the Act, 21 U.S.C. section 360bbb-3(b)(1), unless the authorization is terminated or revoked sooner.     Influenza A by PCR NEGATIVE NEGATIVE Final   Influenza B by PCR NEGATIVE NEGATIVE Final    Comment: (NOTE) The Xpert Xpress SARS-CoV-2/FLU/RSV assay is intended as an aid in  the diagnosis of influenza from Nasopharyngeal swab specimens and  should not be used as a sole basis for treatment. Nasal washings and  aspirates are unacceptable for Xpert Xpress SARS-CoV-2/FLU/RSV  testing.  Fact Sheet for Patients: https://www.moore.com/  Fact Sheet for Healthcare Providers: https://www.young.biz/  This test is not yet approved or cleared by the [place] FDA and  has been authorized for detection and/or diagnosis of SARS-CoV-2 by  FDA under an Emergency Use Authorization (EUA). This EUA will remain  in effect (meaning this test can be used) for the duration of the  Covid-19 declaration under Section 564(b)(1) of the Act, 21  U.S.C. section 360bbb-3(b)(1), unless the authorization is  terminated or revoked. Performed at [facility], [address] [place], [place] [address]   SARS Coronavirus 2 by RT PCR (hospital order, performed in [facility] hospital lab) Nasopharyngeal Nasopharyngeal Swab     Status: None   Collection Time: 01/27/20  8:11 AM   Specimen: Nasopharyngeal Swab  Result Value Ref Range Status   SARS Coronavirus 2 NEGATIVE NEGATIVE Final    Comment: (NOTE) SARS-CoV-2  target nucleic acids are NOT DETECTED.  The SARS-CoV-2 RNA is generally detectable in upper and lower respiratory specimens during the acute phase of infection. The lowest concentration of SARS-CoV-2 viral copies this assay can detect is 250 copies / mL. A negative result does not preclude SARS-CoV-2 infection and should not be used as the sole basis for treatment or other patient management decisions.  A negative result may occur with improper specimen collection / handling, submission of specimen other  than nasopharyngeal swab, presence of viral mutation(s) within the areas targeted by this assay, and inadequate number of viral copies (<250 copies / mL). A negative result must be combined with clinical observations, patient history, and epidemiological information.  Fact Sheet for Patients:   BoilerBrush.com.cy  Fact Sheet for Healthcare Providers: https://pope.com/  This test is not yet approved or  cleared by the [place] FDA and has been authorized for detection and/or diagnosis of SARS-CoV-2 by FDA under an Emergency Use Authorization (EUA).  This EUA will remain in effect (meaning this test can be used) for the duration of the COVID-19 declaration under Section 564(b)(1) of the Act, 21 U.S.C. section 360bbb-3(b)(1), unless the authorization is terminated or revoked sooner.  Performed at [facility], [address] [place], [place] [address]   MRSA PCR Screening     Status: None   Collection Time: 01/29/20  8:17 PM   Specimen: Nasal Mucosa; Nasopharyngeal  Result Value Ref Range Status   MRSA by PCR NEGATIVE NEGATIVE Final    Comment:        The GeneXpert MRSA Assay (FDA approved for NASAL specimens only), is one component of a comprehensive MRSA colonization surveillance program. It is not intended to diagnose MRSA infection nor to guide or monitor treatment for MRSA infections. Performed at [facility], [address], [place], [place] [address]     RADIOLOGY:  No results found.   CODE STATUS:     Code Status Orders  (From admission, onward)         Start     Ordered   01/27/20 1024  Full code  Continuous        01/27/20 1025        Code Status History    Date Active Date Inactive Code Status Order ID Comments User Context   01/10/2020 2310 01/22/2020 2005 Full Code [id number]  Andris Baumann, MD ED   06/26/2019 1516 06/27/2019 1910 Full Code [id number]  Pieter Partridge, MD Inpatient   Advance Care Planning Activity       TOTAL TIME TAKING CARE OF THIS PATIENT: *35* minutes.    Enedina Finner M.D  [facility]    CC: Primary care physician; Elspeth Cho., MD

## 2020-01-31 NOTE — Progress Notes (Signed)
°  Heart Failure Nurse Navigator Note   HFpEF- ( EF 50-55%)  Patient presented form skilled care facility with complaints of increasing SOB, orthopnea , abdominal enlargement, chest and abdominal pressure.   Comorbidities:  Coronary artery disease Atrial fibrillation Diabetes CKD stage III Obesity Hypertension  Medications:  Lasix 40 mg daily  Po Amiodarone 200 mg daily Coreg 6.25 mg BID Diltiazem CD 180 mg daily Lipitor 40 mg daily  To resume Spironolactone 25 mg daily  New medication:  Ramipril 10 mg daily   Labs:  Sodium 141, potassium 4.5,BUN 35, creatinine 1.73.  Intake-730 ml Output-1475 ml   Weight-124.8 kg, yesterday 125.4 kg  Discussed today with the patient how he is going to change some of his habits using the teach back method, he offered not adding salt at the table, eating foods that are low in sodium and to limit his fluid intake to 8-8 oz cups daily and to weigh himself daily.   Tresa Endo RN, CHFN

## 2020-01-31 NOTE — TOC Progression Note (Signed)
Transition of Care [facility]) - Progression Note    Patient Details  Name: Randall Vincent MRN: [id number] Date of Birth: 1940-08-17  [facility]) CM/SW Contact  Shawn Route, RN Phone Number:  01/31/2020, 9:25 AM  Clinical Narrative:       Expected Discharge Plan: Skilled Nursing Facility Barriers to Discharge: Continued Medical Work up  Expected Discharge Plan and Services Expected Discharge Plan: Skilled Nursing Facility     Post Acute Care Choice: Skilled Nursing Facility Living arrangements for the past 2 months: Single Family Home Expected Discharge Date: 01/31/20                                     Social Determinants of Health (SDOH) Interventions    Readmission Risk Interventions No flowsheet data found.

## 2020-01-31 NOTE — Progress Notes (Signed)
PHARMACY CONSULT NOTE FOR:  OUTPATIENT  PARENTERAL ANTIBIOTIC THERAPY (OPAT)  Indication: Knee PJI with E. faecalis Regimen: ampicillin 2gm IV q6h (updated frequency from previous OPAT per current CrCl) End date: 02/22/2020  IV antibiotic discharge orders are pended. To discharging provider:  please sign these orders via discharge navigator,  Select New Orders & click on the button choice - Manage This Unsigned Work.     Thank you for allowing pharmacy to be a part of this patient's care.  Juliette Alcide, PharmD, BCPS.   Work Cell: [phone] 01/31/2020 9:23 AM

## 2020-01-31 NOTE — Progress Notes (Signed)
Mobility Specialist - Progress Note   01/31/20 1245  Mobility  Activity  (Bed exercises)  Range of Motion/Exercises Right leg;Left leg  Level of Assistance Minimal assist, patient does 75% or more  Assistive Device None  Distance Ambulated (ft) 0 ft  Mobility Response Tolerated well  Mobility performed by Mobility specialist  $Mobility charge 1 Mobility    Pre-mobility: 58 HR, 126/48 BP, 99% SpO2 Post-mobility: 60 HR, 142/53 BP, 98% SpO2   Pt was lying in bed upon arrival. Pt agreed to session. Pt denied any pain, dizziness, and fatigue. Pt did c/o feeling weak, but was not limited for mobility. Pt was able to perform bed exercises: ankle pumps (15x/leg), straight leg raises, hip isometrics, hip abd/add (10x/leg) with minA on L LE and independently on R LE. Overall, pt tolerated session well. Pt stated that his RPE was a 7 this session. Overall, pt tolerated session well. Pt remains in bed with all needs met at this time.    Kathee Delton Mobility Specialist 01/31/20, 12:51 PM

## 2020-01-31 NOTE — Progress Notes (Signed)
  Subjective:  Patient reports pain as mild.    Objective:   VITALS:   Vitals:   01/31/20 0301 01/31/20 0307 01/31/20 0749 01/31/20 0758  BP:  (!) 142/59  (!) 152/57  Pulse:  (!) 58  64  Resp:    19  Temp:  97.7 F (36.5 C)  97.9 F (36.6 C)  TempSrc:  Oral    SpO2:  100% 91% 100%  Weight: 124.8 kg     Height:        PHYSICAL EXAM:  Neurologically intact ABD soft Neurovascular intact Sensation intact distally Intact pulses distally Dorsiflexion/Plantar flexion intact Incision: dressing C/D/I No cellulitis present Compartment soft  LABS  Results for orders placed or performed during the hospital encounter of 01/27/20 (from the past 24 hour(s))  Glucose, capillary     Status: Abnormal   Collection Time: 01/30/20 12:07 PM  Result Value Ref Range   Glucose-Capillary 211 (H) 70 - 99 mg/dL  Glucose, capillary     Status: Abnormal   Collection Time: 01/30/20  4:36 PM  Result Value Ref Range   Glucose-Capillary 187 (H) 70 - 99 mg/dL  Basic metabolic panel     Status: Abnormal   Collection Time: 01/31/20  6:44 AM  Result Value Ref Range   Sodium 141 135 - 145 mmol/L   Potassium 4.5 3.5 - 5.1 mmol/L   Chloride 100 98 - 111 mmol/L   CO2 33 (H) 22 - 32 mmol/L   Glucose, Bld 168 (H) 70 - 99 mg/dL   BUN 35 (H) 8 - 23 mg/dL   Creatinine, Ser 5.00 (H) 0.61 - 1.24 mg/dL   Calcium 8.2 (L) 8.9 - 10.3 mg/dL   GFR calc non Af Amer 37 (L) >60 mL/min   GFR calc Af Amer 43 (L) >60 mL/min   Anion gap 8 5 - 15  Glucose, capillary     Status: Abnormal   Collection Time: 01/31/20  8:03 AM  Result Value Ref Range   Glucose-Capillary 149 (H) 70 - 99 mg/dL    No results found.  Assessment/Plan:     Principal Problem:   Acute on chronic diastolic CHF (congestive heart failure) (HCC) Active Problems:   Essential hypertension   DM (diabetes mellitus), type 2 (HCC)   Chronic kidney disease, stage 3a   Acute-on-chronic kidney injury (HCC)   A-fib (HCC)   Obesity, Class III,  BMI 40-49.9 (morbid obesity) (HCC)   Pressure injury of skin   Advance diet Up with therapy, TDWB when medically stable Staples removed Discharge per medicine Follow up with Dr. Odis Luster office in 2-3 weeks, Call office for appt. [phone]   Altamese Cabal , PA-C 01/31/2020, 8:25 AM

## 2020-02-01 LAB — GLUCOSE, CAPILLARY
Glucose-Capillary: 159 mg/dL — ABNORMAL HIGH (ref 70–99)
Glucose-Capillary: 177 mg/dL — ABNORMAL HIGH (ref 70–99)

## 2020-02-01 MED ORDER — IPRATROPIUM-ALBUTEROL 0.5-2.5 (3) MG/3ML IN SOLN: 3.0000 mL | Freq: Four times a day (QID) | RESPIRATORY_TRACT | 0 refills | Status: DC | PRN

## 2020-02-01 NOTE — TOC Transition Note (Signed)
Transition of Care [facility]) - CM/SW Discharge Note   Patient Details  Name: Randall Vincent MRN: [id number] Date of Birth: March 12, 1941  Transition of Care [facility]) CM/SW Contact:  Luvenia Redden, RN Phone Number: 02/01/2020, 12:26 PM   Clinical Narrative:    Verified pt can return to [facility] (SNF) today room 704 after 1:00 PM via [facility]. Spoke with Tammy ([facility]) and provided Navi authorization and d/c summary faxed accordingly. Pt will be equipped with oxygen and return with the presented polar ice medical device. Facility is aware. RN spoke with son, daughter in law Randall Vincent) and the pt concerning the planned discharge today. No other issues or concerns at this time.    Final next level of care: Skilled Nursing Facility Barriers to Discharge: No Barriers Identified   Patient Goals and CMS Choice     Choice offered to / list presented to : Patient  Discharge Placement              Patient chooses bed at: [facility] [place] Patient to be transferred to facility by: [facility] Name of family member notified: Randall Vincent Patient and family notified of of transfer: 02/01/20 (Unable to speak with family member, left message)  Discharge Plan and Services     Post Acute Care Choice: Skilled Nursing Facility                               Social Determinants of Health (SDOH) Interventions     Readmission Risk Interventions No flowsheet data found.

## 2020-02-01 NOTE — Discharge Summary (Addendum)
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 heart failure) (HCC) [I50.33] Acute on chronic respiratory failure with hypoxia (HCC) [J96.21] Acute on chronic congestive heart failure, unspecified heart failure type (HCC) [I50.9]  DISCHARGE DIAGNOSIS:  Acute on Chronic respiratory with hypoxia due to CHF-diastolic Left Knee septic arthritis--on IV abxs till 02/22/2020  SECONDARY DIAGNOSIS:   Past Medical History:  Diagnosis Date   A-fib (HCC)    Diabetes (HCC)    Hyperlipidemia    Hypertension     HOSPITAL COURSE:   Randall Vincent a 79 y.o.malewith medical history significant forcoronary artery disease status post CABG,[place] CHF,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  Acute on chronicdiastolic dysfunctionCHF -change to po lasix 40 mg qd.Monitor BMP, daily weight and urine output.  -2D echo on 01/11/2020 showed EF estimated at 50 to 55% -UOP total 10,200 cc, weight trending down -wean oxygen to keep sats greater than 92%. Patient recently was discharged on oxygen to the facility. -Incentive spirometer -patient follows with cardiology in [facility].  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  -Diagnosed during his last  hospitalization -Operative tissue cultures grew Enterococcus faecalis. -Had surgery 8/26, removed L knee prosthesis and placed antibiotic spacer -Continue IV ampicillin 2 g every 6 hours through 02/22/2020--renally adjusted dosing per pharmacy  CAD, multiple vessel, S/P CABG x 2 -Stable.  -Continue statin,carvedilol andaspirin  -added ramipril -resumed aldactone  Atrial fibrillation -Status post cardioversion 10/22/19.  -Currently in sinus rhythm. -Continue amiodarone, carvedilol and diltiazem -Patient was on long-term anticoagulation with Pradaxa as primary prophylaxis for an acute stroke anticoagulation was discontinued following his last hospitalization for acute blood loss anemia related to GI bleed  -Patient declines further anticoagulation therapy and wishes to be placed on only aspirin  Essential hypertension Chronic.  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 cont carbohydrate diet, home meds Glycemic control with sliding scale insulin  Acute on Chronic kidney injuryIII -This is probably due to cardiorenal syndrome. -At baseline patient has a serum creatinine of 1.3. -Creatinine is trending down to 1.8 -No acute abnormality on renal ultrasound. -Aldactone is now resumed -Avoid nephrotoxins  Foley removed couple days ago. Procedures:none Family communication :pt and son in the room today Consults :none CODE STATUS: FULL DVT Prophylaxis :Heparin  Status is: Inpatient  Dispo: The patient is from: SNF  Anticipated d/c is to: [facility]  Anticipated d/c date is: today  Patient currently is medically best at near baseline stable to d/c.   CONSULTS OBTAINED:  Treatment Team:  Lyndle Herrlich, MD  DRUG ALLERGIES:  No Known Allergies  DISCHARGE MEDICATIONS:   Allergies as of 02/01/2020    No Known Allergies     Medication List    STOP taking these medications   dabigatran 75 MG Caps capsule  Commonly known as: PRADAXA   polyethylene glycol 17 g packet Commonly known as: MIRALAX / GLYCOLAX   sildenafil 100 MG tablet Commonly known as: VIAGRA     TAKE these medications   acetaminophen 500 MG tablet Commonly known as: TYLENOL Take 500-1,000 mg by mouth every 6 (six) hours as needed for mild pain or fever.   albuterol 108 (90 Base) MCG/ACT inhaler Commonly known as: VENTOLIN HFA Inhale 2 puffs into the lungs every 6 (six) hours as needed for wheezing.   amiodarone 200 MG tablet Commonly known as: PACERONE Take 200 mg by mouth daily.   ampicillin  IVPB Inject 2 g into the vein every 6 (six) hours. Indication:  E. Faecalis PJI of knee Last Day of Therapy:  02/22/2020 Labs - Once weekly:  CBC/D, CMP, CRP What changed:   when to take this  additional instructions   aspirin EC 81 MG tablet Take 1 tablet (81 mg total) by mouth daily. Swallow whole.   atorvastatin 40 MG tablet Commonly known as: LIPITOR Take 40 mg by mouth every evening.   carvedilol 6.25 MG tablet Commonly known as: COREG Take 1 tablet (6.25 mg total) by mouth 2 (two) times daily with a meal.   diltiazem 180 MG 24 hr capsule Commonly known as: Cardizem CD Take 1 capsule (180 mg total) by mouth daily.   fluticasone 50 MCG/ACT nasal spray Commonly known as: FLONASE Place 2 sprays into both nostrils daily as needed for allergies or rhinitis.   furosemide 40 MG tablet Commonly known as: LASIX Take 1 tablet (40 mg total) by mouth daily. What changed:   medication strength  how much to take   guaiFENesin 600 MG 12 hr tablet Commonly known as: MUCINEX Take 1 tablet (600 mg total) by mouth 2 (two) times daily as needed for to loosen phlegm.   ipratropium-albuterol 0.5-2.5 (3) MG/3ML Soln Commonly known as: DUONEB Take 3 mLs by nebulization every 6 (six) hours as needed.    mupirocin ointment 2 % Commonly known as: BACTROBAN Apply 1 application topically daily as needed (leg sores).   ramipril 10 MG capsule Commonly known as: ALTACE Take 10 mg by mouth daily. What changed: Another medication with the same name was added. Make sure you understand how and when to take each.   ramipril 10 MG capsule Commonly known as: ALTACE Take 1 capsule (10 mg total) by mouth daily. What changed: You were already taking a medication with the same name, and this prescription was added. Make sure you understand how and when to take each.   Semaglutide (1 MG/DOSE) 2 MG/1.5ML Sopn Inject 1 mg into the skin every Thursday.   spironolactone 25 MG tablet Commonly known as: ALDACTONE Take 25 mg by mouth daily.   traMADol 50 MG tablet Commonly known as: ULTRAM Take 1 tablet (50 mg total) by mouth every 8 (eight) hours as needed for moderate pain or severe pain. What changed:   when to take this  reasons to take this   vitamin B-12 1000 MCG tablet Commonly known as: CYANOCOBALAMIN Take 1,000 mcg by mouth daily.            Discharge Care Instructions  (From admission, onward)         Start     Ordered   01/31/20 0000  Discharge wound care:       Comments: Foam padding on left heel stage 1   01/31/20 0756   01/31/20 0000  Change dressing on IV access  line weekly and PRN  (Home infusion instructions - [facility] )        01/31/20 0908          If you experience worsening of your admission symptoms, develop shortness of breath, life threatening emergency, suicidal or homicidal thoughts you must seek medical attention immediately by calling 911 or calling your MD immediately  if symptoms less severe.  You Must read complete instructions/literature along with all the possible adverse reactions/side effects for all the Medicines you take and that have been prescribed to you. Take any new Medicines after you have completely understood and accept all  the possible adverse reactions/side effects.   Please note  You were cared for by a hospitalist during your hospital stay. If you have any questions about your discharge medications or the care you received while you were in the hospital after you are discharged, you can call the unit and asked to speak with the hospitalist on call if the hospitalist that took care of you is not available. Once you are discharged, your primary care physician will handle any further medical issues. Please note that NO REFILLS for any discharge medications will be authorized once you are discharged, as it is imperative that you return to your primary care physician (or establish a relationship with a primary care physician if you do not have one) for your aftercare needs so that they can reassess your need for medications and monitor your lab values. Today   SUBJECTIVE   Feels better Now on 2L [place]--sats 100%  VITAL SIGNS:  Blood pressure (!) 158/62, pulse 71, temperature 97.8 F (36.6 C), temperature source Oral, resp. rate 19, height  (1.727 m), weight 124.8 kg, SpO2 93 %.  I/O:    Intake/Output Summary (Last 24 hours) at 02/01/2020 1016 Last data filed at 01/31/2020 2248 Gross per 24 hour  Intake 120 ml  Output 1100 ml  Net -980 ml    PHYSICAL EXAMINATION:  GENERAL:  79 y.o.-year-old patient lying in the bed with no acute distress. Obese EYES: Pupils equal, round, reactive to light and accommodation. No scleral icterus.   HEENT: Head atraumatic, normocephalic. Oropharynx and nasopharynx clear.  LUNGS: decreased breath sounds bilaterally, no wheezing, rales, rhonchi. No use of accessory muscles of respiration.  CARDIOVASCULAR: S1, S2 normal. No murmurs, rubs, or gallops.  ABDOMEN: Soft, nontender, nondistended. Bowel sounds present. No organomegaly or mass.  EXTREMITIES: +edema b/l.  Left knee immobiliser   NEUROLOGIC: Cranial nerves II through XII are intact. No focal Motor or sensory deficits  b/l.   PSYCHIATRIC:  patient is alert and oriented x 3.  SKIN: Pressure Injury 01/29/20 Heel Left Stage 1 -  Intact skin with non-blanchable redness of a localized area usually over a bony prominence. due to knee mobilizer (Active)  01/29/20 0930  Location: Heel  Location Orientation: Left  Staging: Stage 1 -  Intact skin with non-blanchable redness of a localized area usually over a bony prominence.  Wound Description (Comments): due to knee mobilizer  Present on Admission: Yes        DATA REVIEW:   CBC  Recent Labs  Lab 01/27/20 0811  WBC 12.0*  HGB 8.4*  HCT 27.7*  PLT 433*    Chemistries  Recent Labs  Lab 01/27/20 0811 01/28/20 0536 01/31/20 0644  NA 139   < > 141  K 4.8   < > 4.5  CL 104   < > 100  CO2 26   < >  33*  GLUCOSE 182*   < > 168*  BUN 44*   < > 35*  CREATININE 2.93*   < > 1.73*  CALCIUM 8.1*   < > 8.2*  AST 19  --   --   ALT 25  --   --   ALKPHOS 99  --   --   BILITOT 1.0  --   --    < > = values in this interval not displayed.    Microbiology Results   Recent Results (from the past 240 hour(s))  SARS Coronavirus 2 by RT PCR (hospital order, performed in [facility] hospital lab) Nasopharyngeal Nasopharyngeal Swab     Status: None   Collection Time: 01/27/20  8:11 AM   Specimen: Nasopharyngeal Swab  Result Value Ref Range Status   SARS Coronavirus 2 NEGATIVE NEGATIVE Final    Comment: (NOTE) SARS-CoV-2 target nucleic acids are NOT DETECTED.  The SARS-CoV-2 RNA is generally detectable in upper and lower respiratory specimens during the acute phase of infection. The lowest concentration of SARS-CoV-2 viral copies this assay can detect is 250 copies / mL. A negative result does not preclude SARS-CoV-2 infection and should not be used as the sole basis for treatment or other patient management decisions.  A negative result may occur with improper specimen collection / handling, submission of specimen other than nasopharyngeal swab,  presence of viral mutation(s) within the areas targeted by this assay, and inadequate number of viral copies (<250 copies / mL). A negative result must be combined with clinical observations, patient history, and epidemiological information.  Fact Sheet for Patients:   BoilerBrush.com.cy  Fact Sheet for Healthcare Providers: https://pope.com/  This test is not yet approved or  cleared by the [place] FDA and has been authorized for detection and/or diagnosis of SARS-CoV-2 by FDA under an Emergency Use Authorization (EUA).  This EUA will remain in effect (meaning this test can be used) for the duration of the COVID-19 declaration under Section 564(b)(1) of the Act, 21 U.S.C. section 360bbb-3(b)(1), unless the authorization is terminated or revoked sooner.  Performed at [facility], [address] [place], [place] [address]   MRSA PCR Screening     Status: None   Collection Time: 01/29/20  8:17 PM   Specimen: Nasal Mucosa; Nasopharyngeal  Result Value Ref Range Status   MRSA by PCR NEGATIVE NEGATIVE Final    Comment:        The GeneXpert MRSA Assay (FDA approved for NASAL specimens only), is one component of a comprehensive MRSA colonization surveillance program. It is not intended to diagnose MRSA infection nor to guide or monitor treatment for MRSA infections. Performed at [facility], [address] [place], [place] [address]   SARS Coronavirus 2 by RT PCR (hospital order, performed in [facility] hospital lab) Nasopharyngeal Nasopharyngeal Swab     Status: None   Collection Time: 01/31/20 10:34 AM   Specimen: Nasopharyngeal Swab  Result Value Ref Range Status   SARS Coronavirus 2 NEGATIVE NEGATIVE Final    Comment: (NOTE) SARS-CoV-2 target nucleic acids are NOT DETECTED.  The SARS-CoV-2 RNA is generally detectable in upper and lower respiratory specimens during the acute phase of infection.  The lowest concentration of SARS-CoV-2 viral copies this assay can detect is 250 copies / mL. A negative result does not preclude SARS-CoV-2 infection and should not be used as the sole basis for treatment or other patient management decisions.  A negative result may occur with improper  specimen collection / handling, submission of specimen other than nasopharyngeal swab, presence of viral mutation(s) within the areas targeted by this assay, and inadequate number of viral copies (<250 copies / mL). A negative result must be combined with clinical observations, patient history, and epidemiological information.  Fact Sheet for Patients:   BoilerBrush.com.cy  Fact Sheet for Healthcare Providers: https://pope.com/  This test is not yet approved or  cleared by the [place] FDA and has been authorized for detection and/or diagnosis of SARS-CoV-2 by FDA under an Emergency Use Authorization (EUA).  This EUA will remain in effect (meaning this test can be used) for the duration of the COVID-19 declaration under Section 564(b)(1) of the Act, 21 U.S.C. section 360bbb-3(b)(1), unless the authorization is terminated or revoked sooner.  Performed at [facility], [address] [place], [place] [address]     RADIOLOGY:  No results found.   CODE STATUS:     Code Status Orders  (From admission, onward)         Start     Ordered   01/27/20 1024  Full code  Continuous        01/27/20 1025        Code Status History    Date Active Date Inactive Code Status Order ID Comments User Context   01/10/2020 2310 01/22/2020 2005 Full Code [id number]  Andris Baumann, MD ED   06/26/2019 1516 06/27/2019 1910 Full Code [id number]  Pieter Partridge, MD Inpatient   Advance Care Planning Activity       TOTAL TIME TAKING CARE OF THIS PATIENT: *35* minutes.    Enedina Finner M.D  [facility]    CC: Primary care physician;  Elspeth Cho., MD

## 2020-02-01 NOTE — Progress Notes (Signed)
Patient discharged to [facility] via EMS.  Report given to [facility].

## 2020-02-02 ENCOUNTER — Emergency Department: Payer: Medicare Other

## 2020-02-02 ENCOUNTER — Inpatient Hospital Stay
Admission: EM | Admit: 2020-02-02 | Discharge: 2020-02-09 | DRG: 291 | Disposition: A | Discharge status: Home Health | Payer: Medicare Other | Attending: Obstetrics and Gynecology | Admitting: Obstetrics and Gynecology

## 2020-02-02 ENCOUNTER — Other Ambulatory Visit: Payer: Self-pay

## 2020-02-02 DIAGNOSIS — I5033 Acute on chronic diastolic (congestive) heart failure: Secondary | ICD-10-CM | POA: Diagnosis present

## 2020-02-02 DIAGNOSIS — E785 Hyperlipidemia, unspecified: Secondary | ICD-10-CM | POA: Diagnosis present

## 2020-02-02 DIAGNOSIS — Z951 Presence of aortocoronary bypass graft: Secondary | ICD-10-CM

## 2020-02-02 DIAGNOSIS — E119 Type 2 diabetes mellitus without complications: Secondary | ICD-10-CM

## 2020-02-02 DIAGNOSIS — I480 Paroxysmal atrial fibrillation: Secondary | ICD-10-CM | POA: Diagnosis not present

## 2020-02-02 DIAGNOSIS — R0602 Shortness of breath: Secondary | ICD-10-CM

## 2020-02-02 DIAGNOSIS — N1832 Chronic kidney disease, stage 3b: Secondary | ICD-10-CM | POA: Diagnosis present

## 2020-02-02 DIAGNOSIS — R040 Epistaxis: Secondary | ICD-10-CM | POA: Diagnosis not present

## 2020-02-02 DIAGNOSIS — I4891 Unspecified atrial fibrillation: Secondary | ICD-10-CM | POA: Diagnosis present

## 2020-02-02 DIAGNOSIS — Z6841 Body Mass Index (BMI) 40.0 and over, adult: Secondary | ICD-10-CM

## 2020-02-02 DIAGNOSIS — D631 Anemia in chronic kidney disease: Secondary | ICD-10-CM | POA: Diagnosis present

## 2020-02-02 DIAGNOSIS — Z79899 Other long term (current) drug therapy: Secondary | ICD-10-CM

## 2020-02-02 DIAGNOSIS — I509 Heart failure, unspecified: Secondary | ICD-10-CM

## 2020-02-02 DIAGNOSIS — E1122 Type 2 diabetes mellitus with diabetic chronic kidney disease: Secondary | ICD-10-CM | POA: Diagnosis present

## 2020-02-02 DIAGNOSIS — I10 Essential (primary) hypertension: Secondary | ICD-10-CM

## 2020-02-02 DIAGNOSIS — Z87891 Personal history of nicotine dependence: Secondary | ICD-10-CM

## 2020-02-02 DIAGNOSIS — Z20822 Contact with and (suspected) exposure to covid-19: Secondary | ICD-10-CM | POA: Diagnosis present

## 2020-02-02 DIAGNOSIS — M009 Pyogenic arthritis, unspecified: Secondary | ICD-10-CM | POA: Diagnosis present

## 2020-02-02 DIAGNOSIS — I5043 Acute on chronic combined systolic (congestive) and diastolic (congestive) heart failure: Secondary | ICD-10-CM

## 2020-02-02 DIAGNOSIS — I130 Hypertensive heart and chronic kidney disease with heart failure and stage 1 through stage 4 chronic kidney disease, or unspecified chronic kidney disease: Secondary | ICD-10-CM | POA: Diagnosis not present

## 2020-02-02 DIAGNOSIS — J810 Acute pulmonary edema: Secondary | ICD-10-CM | POA: Diagnosis not present

## 2020-02-02 DIAGNOSIS — D649 Anemia, unspecified: Secondary | ICD-10-CM | POA: Diagnosis present

## 2020-02-02 DIAGNOSIS — R0600 Dyspnea, unspecified: Secondary | ICD-10-CM

## 2020-02-02 DIAGNOSIS — N179 Acute kidney failure, unspecified: Secondary | ICD-10-CM | POA: Diagnosis present

## 2020-02-02 DIAGNOSIS — N1831 Chronic kidney disease, stage 3a: Secondary | ICD-10-CM | POA: Diagnosis present

## 2020-02-02 DIAGNOSIS — I2510 Atherosclerotic heart disease of native coronary artery without angina pectoris: Secondary | ICD-10-CM | POA: Diagnosis present

## 2020-02-02 DIAGNOSIS — J9601 Acute respiratory failure with hypoxia: Secondary | ICD-10-CM | POA: Diagnosis present

## 2020-02-02 DIAGNOSIS — Z7982 Long term (current) use of aspirin: Secondary | ICD-10-CM

## 2020-02-02 DIAGNOSIS — Z96653 Presence of artificial knee joint, bilateral: Secondary | ICD-10-CM | POA: Diagnosis present

## 2020-02-02 LAB — CBC
HCT: 23.2 % — ABNORMAL LOW (ref 39.0–52.0)
Hemoglobin: 7 g/dL — ABNORMAL LOW (ref 13.0–17.0)
MCH: 26.4 pg (ref 26.0–34.0)
MCHC: 30.2 g/dL (ref 30.0–36.0)
MCV: 87.5 fL (ref 80.0–100.0)
Platelets: 251 10*3/uL (ref 150–400)
RBC: 2.65 MIL/uL — ABNORMAL LOW (ref 4.22–5.81)
RDW: 17 % — ABNORMAL HIGH (ref 11.5–15.5)
WBC: 8.6 10*3/uL (ref 4.0–10.5)
nRBC: 0 % (ref 0.0–0.2)

## 2020-02-02 LAB — COMPREHENSIVE METABOLIC PANEL
ALT: 22 U/L (ref 0–44)
AST: 18 U/L (ref 15–41)
Albumin: 2.4 g/dL — ABNORMAL LOW (ref 3.5–5.0)
Alkaline Phosphatase: 96 U/L (ref 38–126)
Anion gap: 8 (ref 5–15)
BUN: 32 mg/dL — ABNORMAL HIGH (ref 8–23)
CO2: 35 mmol/L — ABNORMAL HIGH (ref 22–32)
Calcium: 8.5 mg/dL — ABNORMAL LOW (ref 8.9–10.3)
Chloride: 98 mmol/L (ref 98–111)
Creatinine, Ser: 1.57 mg/dL — ABNORMAL HIGH (ref 0.61–1.24)
GFR calc Af Amer: 48 mL/min — ABNORMAL LOW (ref >60)
GFR calc non Af Amer: 42 mL/min — ABNORMAL LOW (ref >60)
Glucose, Bld: 230 mg/dL — ABNORMAL HIGH (ref 70–99)
Potassium: 4 mmol/L (ref 3.5–5.1)
Sodium: 141 mmol/L (ref 135–145)
Total Bilirubin: 0.7 mg/dL (ref 0.3–1.2)
Total Protein: 5.5 g/dL — ABNORMAL LOW (ref 6.5–8.1)

## 2020-02-02 LAB — SARS CORONAVIRUS 2 BY RT PCR (HOSPITAL ORDER, PERFORMED IN ~~LOC~~ HOSPITAL LAB): SARS Coronavirus 2: NEGATIVE

## 2020-02-02 LAB — BRAIN NATRIURETIC PEPTIDE: B Natriuretic Peptide: 1387 pg/mL — ABNORMAL HIGH (ref 0.0–100.0)

## 2020-02-02 LAB — TROPONIN I (HIGH SENSITIVITY): Troponin I (High Sensitivity): 52 ng/L — ABNORMAL HIGH (ref <18)

## 2020-02-02 MED ORDER — FUROSEMIDE 10 MG/ML IJ SOLN
40.0000 mg | Freq: Once | INTRAMUSCULAR | Status: AC
  Administered 2020-02-02: 40 mg via INTRAVENOUS
  Filled 2020-02-02: qty 4

## 2020-02-02 NOTE — H&P (Signed)
History and Physical   Randall Vincent BDZ:[id number] DOB: Aug 20, 1940 DOA: 02/02/2020  Referring MD/NP/PA: Dr. Cyril Loosen  PCP: Elspeth Cho., MD   Patient coming from: Skilled nursing facility  Chief Complaint: Shortness of breath  HPI: Randall Vincent is a 79 y.o. 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  He had left septic arthritis and was on IV antibiotics followed by orthopedics during 2 previous hospitalizations.  Patient complained of worsening shortness of breath and swelling.  No fever or chills no nausea vomiting or diarrhea.  At discharge patient was transitioned from IV Lasix to oral Lasix 40 mg daily.  He had an echo done in August showing EF of 50 to 55%.  His weight was getting better.  Given incentive spirometer and to follow-up with his cardiologist over at [facility].  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  He had surgery in August where prosthesis was removed and antibiotic spacer was placed.  He was to stay off anticoagulation again except for aspirin due to his recent GI bleed.  Patient is now back with increasing his weight worsening shortness of breath and found to have bilateral pulmonary edema.  Is being readmitted to the hospital for possible treatment.  Additionally patient was found to have possible infiltrates bilaterally.  Repeat COVID-19 so far is negative.  Is being admitted for further treatment..  ED Course: Temperature is 98 blood pressure 150/52 pulse 50 respirate of 31 oxygen sat 91% on room air.  White count 8.6 hemoglobin 7.1 platelets 251 sodium 142 potassium 4.2 chloride 90  CO2 35 BUN 30 creatinine 1.57 calcium 8.5.  Glucose is 230.  BNP of 1387.  Chest x-ray showed bilateral pleural effusion and pulmonary edema.  Patient be readmitted for further treatment  Review of Systems: As per HPI otherwise 10 point review of systems negative.    Past Medical History:  Diagnosis Date  . A-fib (HCC)   . Diabetes (HCC)   . Hyperlipidemia   . Hypertension     Past Surgical History:  Procedure Laterality Date  . BLADDER SURGERY    . BYPASS GRAFT    . CARDIOVERSION    . COLON SURGERY    . COLONOSCOPY N/A 01/15/2020   Procedure: COLONOSCOPY;  Surgeon: Pasty Spillers, MD;  Location: [facility] ENDOSCOPY;  Service: Endoscopy;  Laterality: N/A;  . COLONOSCOPY N/A 01/16/2020   Procedure: COLONOSCOPY;  Surgeon: Pasty Spillers, MD;  Location: [facility] ENDOSCOPY;  Service: Endoscopy;  Laterality: N/A;  . ESOPHAGOGASTRODUODENOSCOPY N/A 01/13/2020   Procedure: ESOPHAGOGASTRODUODENOSCOPY (EGD);  Surgeon: Pasty Spillers, MD;  Location: [facility] ENDOSCOPY;  Service: Endoscopy;  Laterality: N/A;  . REPLACEMENT TOTAL KNEE BILATERAL    . STENT PLACEMENT VASCULAR ([facility] HX)    . TOTAL KNEE REVISION Left 01/16/2020   Procedure: TOTAL KNEE REVISION;  Surgeon: Lyndle Herrlich, MD;  Location: [facility] ORS;  Service: Orthopedics;  Laterality: Left;     reports that he quit smoking about 30 years ago. He has never used smokeless tobacco. No history on file for alcohol use and drug use.  No Known Allergies  History reviewed. No pertinent family history.   Prior  to Admission medications   Medication Sig Start Date End Date Taking? Authorizing Provider  acetaminophen (TYLENOL) 500 MG tablet Take 500-1,000 mg by mouth every 6 (six) hours as needed for mild pain or fever.    [provider]  albuterol (VENTOLIN HFA) 108 (90 Base) MCG/ACT inhaler Inhale 2 puffs into the lungs every 6 (six) hours as needed for wheezing.    [provider]  amiodarone (PACERONE) 200 MG tablet  Take 200 mg by mouth daily. 10/29/19   [provider]  ampicillin IVPB Inject 2 g into the vein every 6 (six) hours. Indication:  E. Faecalis PJI of knee Last Day of Therapy:  02/22/2020 Labs - Once weekly:  CBC/D, CMP, CRP 01/31/20   Enedina Finner, MD  aspirin EC 81 MG tablet Take 1 tablet (81 mg total) by mouth daily. Swallow whole. Patient not taking: Reported on 01/27/2020 01/22/20 02/21/20  Lurene Shadow, MD  atorvastatin (LIPITOR) 40 MG tablet Take 40 mg by mouth every evening.  06/18/19   [provider]  carvedilol (COREG) 6.25 MG tablet Take 1 tablet (6.25 mg total) by mouth 2 (two) times daily with a meal. 01/22/20   Lurene Shadow, MD  diltiazem (CARDIZEM CD) 180 MG 24 hr capsule Take 1 capsule (180 mg total) by mouth daily. 01/22/20 01/21/21  Lurene Shadow, MD  fluticasone (FLONASE) 50 MCG/ACT nasal spray Place 2 sprays into both nostrils daily as needed for allergies or rhinitis.    [provider]  furosemide (LASIX) 40 MG tablet Take 1 tablet (40 mg total) by mouth daily. 01/31/20   Enedina Finner, MD  guaiFENesin (MUCINEX) 600 MG 12 hr tablet Take 1 tablet (600 mg total) by mouth 2 (two) times daily as needed for to loosen phlegm. 01/31/20   Enedina Finner, MD  ipratropium-albuterol (DUONEB) 0.5-2.5 (3) MG/3ML SOLN Take 3 mLs by nebulization every 6 (six) hours as needed. 02/01/20   Enedina Finner, MD  mupirocin ointment (BACTROBAN) 2 % Apply 1 application topically daily as needed (leg sores).  11/16/19   [provider]  ramipril (ALTACE) 10 MG capsule Take 1 capsule (10 mg total) by mouth daily. 01/31/20   Enedina Finner, MD  ramipril (ALTACE) 10 MG capsule Take 10 mg by mouth daily.    [provider]  Semaglutide, 1 MG/DOSE, 2 MG/1.5ML SOPN Inject 1 mg into the skin every Thursday.    [provider]  spironolactone (ALDACTONE) 25 MG tablet Take 25 mg by mouth daily. 06/18/19   [provider]  traMADol (ULTRAM) 50 MG tablet Take 1 tablet (50 mg  total) by mouth every 8 (eight) hours as needed for moderate pain or severe pain. 01/31/20   Enedina Finner, MD  vitamin B-12 (CYANOCOBALAMIN) 1000 MCG tablet Take 1,000 mcg by mouth daily.    [provider]    Physical Exam: Vitals:   02/02/20 2000 02/02/20 2030 02/02/20 2100 02/02/20 2130  BP: (!) 143/54 (!) 141/58 (!) 144/57 (!) 150/52  Pulse: 62 63 (!) 59 64  Resp: (!) 26 (!) Temp:      TempSrc:      SpO2: 91% 94% 97% 91%      Constitutional: Chronically ill looking, but no significant distress Vitals:   02/02/20 2000 02/02/20 2030 02/02/20 2100 02/02/20 2130  BP: (!) 143/54 (!) 141/58 (!) 144/57 (!) 150/52  Pulse: 62 63 (!) 59 64  Resp: (!) 26 (!) Temp:  TempSrc:      SpO2: 91% 94% 97% 91%   Eyes: PERRL, lids and conjunctivae normal ENMT: Mucous membranes are moist. Posterior pharynx clear of any exudate or lesions.Normal dentition.  Neck: normal, supple, no masses, no thyromegaly Respiratory: Coarse breath sound bilaterally with significant bilateral crackles. Normal respiratory effort. No accessory muscle use.  Cardiovascular: Regular rate and rhythm, no murmurs / rubs / gallops. No extremity edema. 2+ pedal pulses. No carotid bruits.  Abdomen: no tenderness, no masses palpated. No hepatosplenomegaly. Bowel sounds positive.  Musculoskeletal: no clubbing / cyanosis. No joint deformity upper and lower extremities. Good ROM, no contractures. Normal muscle tone.  Skin: no rashes, lesions, ulcers. No induration Neurologic: CN 2-12 grossly intact. Sensation intact, DTR normal. Strength 5/5 in all 4.  Psychiatric: Normal judgment and insight. Alert and oriented x 3. Normal mood.     Labs on Admission: I have personally reviewed following labs and imaging studies  CBC: Recent Labs  Lab 01/27/20 0811 02/02/20 1918  WBC 12.0* 8.6  NEUTROABS 10.7*  --   HGB 8.4* 7.0*  HCT 27.7* 23.2*  MCV 89.1 87.5  PLT 433* 251   Basic Metabolic  Panel: Recent Labs  Lab 01/28/20 0536 01/29/20 0508 01/30/20 0503 01/31/20 0644 02/02/20 1918  NA 138 140 141 141 141  K 4.7 4.5 4.4 4.5 4.0  CL 103 102 101 100 98  CO2 28 29 32 33* 35*  GLUCOSE 153* 163* 169* 168* 230*  BUN 48* 42* 40* 35* 32*  CREATININE 2.56* 2.26* 1.87* 1.73* 1.57*  CALCIUM 8.1* 8.4* 8.4* 8.2* 8.5*   GFR: Estimated Creatinine Clearance: 49.9 mL/min (A) (by C-G formula based on SCr of 1.57 mg/dL (H)). Liver Function Tests: Recent Labs  Lab 01/27/20 0811 02/02/20 1918  AST 19 18  ALT 25 22  ALKPHOS 99 96  BILITOT 1.0 0.7  PROT 6.5 5.5*  ALBUMIN 2.5* 2.4*   No results for input(s): LIPASE, AMYLASE in the last 168 hours. No results for input(s): AMMONIA in the last 168 hours. Coagulation Profile: Recent Labs  Lab 01/27/20 0811  INR 1.5*   Cardiac Enzymes: No results for input(s): CKTOTAL, CKMB, CKMBINDEX, TROPONINI in the last 168 hours. BNP (last 3 results) No results for input(s): PROBNP in the last 8760 hours. HbA1C: No results for input(s): HGBA1C in the last 72 hours. CBG: Recent Labs  Lab 01/31/20 1147 01/31/20 1625 01/31/20 2114 02/01/20 0818 02/01/20 1117  GLUCAP 150* 157* 144* 159* 177*   Lipid Profile: No results for input(s): CHOL, HDL, LDLCALC, TRIG, CHOLHDL, LDLDIRECT in the last 72 hours. Thyroid Function Tests: No results for input(s): TSH, T4TOTAL, FREET4, T3FREE, THYROIDAB in the last 72 hours. Anemia Panel: No results for input(s): VITAMINB12, FOLATE, FERRITIN, TIBC, IRON, RETICCTPCT in the last 72 hours. Urine analysis: No results found for: COLORURINE, APPEARANCEUR, LABSPEC, PHURINE, GLUCOSEU, HGBUR, BILIRUBINUR, KETONESUR, PROTEINUR, UROBILINOGEN, NITRITE, LEUKOCYTESUR Sepsis Labs: (procalcitonin:4,lacticidven:4) ) Recent Results (from the past 240 hour(s))  SARS Coronavirus 2 by RT PCR (hospital order, performed in [facility] hospital lab) Nasopharyngeal Nasopharyngeal Swab     Status: None    Collection Time: 01/27/20  8:11 AM   Specimen: Nasopharyngeal Swab  Result Value Ref Range Status   SARS Coronavirus 2 NEGATIVE NEGATIVE Final    Comment: (NOTE) SARS-CoV-2 target nucleic acids are NOT DETECTED.  The SARS-CoV-2 RNA is generally detectable in upper and lower respiratory specimens during the acute phase of infection. The lowest concentration of SARS-CoV-2 viral copies this assay can  detect is 250 copies / mL. A negative result does not preclude SARS-CoV-2 infection and should not be used as the sole basis for treatment or other patient management decisions.  A negative result may occur with improper specimen collection / handling, submission of specimen other than nasopharyngeal swab, presence of viral mutation(s) within the areas targeted by this assay, and inadequate number of viral copies (<250 copies / mL). A negative result must be combined with clinical observations, patient history, and epidemiological information.  Fact Sheet for Patients:   BoilerBrush.com.cy  Fact Sheet for Healthcare Providers: https://pope.com/  This test is not yet approved or  cleared by the [place] FDA and has been authorized for detection and/or diagnosis of SARS-CoV-2 by FDA under an Emergency Use Authorization (EUA).  This EUA will remain in effect (meaning this test can be used) for the duration of the COVID-19 declaration under Section 564(b)(1) of the Act, 21 U.S.C. section 360bbb-3(b)(1), unless the authorization is terminated or revoked sooner.  Performed at [facility], [address] [place], [place] [address]   MRSA PCR Screening     Status: None   Collection Time: 01/29/20  8:17 PM   Specimen: Nasal Mucosa; Nasopharyngeal  Result Value Ref Range Status   MRSA by PCR NEGATIVE NEGATIVE Final    Comment:        The GeneXpert MRSA Assay (FDA approved for NASAL specimens only), is one component of  a comprehensive MRSA colonization surveillance program. It is not intended to diagnose MRSA infection nor to guide or monitor treatment for MRSA infections. Performed at [facility], [address] [place], [place] [address]   SARS Coronavirus 2 by RT PCR (hospital order, performed in [facility] hospital lab) Nasopharyngeal Nasopharyngeal Swab     Status: None   Collection Time: 01/31/20 10:34 AM   Specimen: Nasopharyngeal Swab  Result Value Ref Range Status   SARS Coronavirus 2 NEGATIVE NEGATIVE Final    Comment: (NOTE) SARS-CoV-2 target nucleic acids are NOT DETECTED.  The SARS-CoV-2 RNA is generally detectable in upper and lower respiratory specimens during the acute phase of infection. The lowest concentration of SARS-CoV-2 viral copies this assay can detect is 250 copies / mL. A negative result does not preclude SARS-CoV-2 infection and should not be used as the sole basis for treatment or other patient management decisions.  A negative result may occur with improper specimen collection / handling, submission of specimen other than nasopharyngeal swab, presence of viral mutation(s) within the areas targeted by this assay, and inadequate number of viral copies (<250 copies / mL). A negative result must be combined with clinical observations, patient history, and epidemiological information.  Fact Sheet for Patients:   BoilerBrush.com.cy  Fact Sheet for Healthcare Providers: https://pope.com/  This test is not yet approved or  cleared by the [place] FDA and has been authorized for detection and/or diagnosis of SARS-CoV-2 by FDA under an Emergency Use Authorization (EUA).  This EUA will remain in effect (meaning this test can be used) for the duration of the COVID-19 declaration under Section 564(b)(1) of the Act, 21 U.S.C. section 360bbb-3(b)(1), unless the authorization is terminated or revoked  sooner.  Performed at [facility], [address] [place], [place] [address]   SARS Coronavirus 2 by RT PCR (hospital order, performed in [facility] hospital lab) Nasopharyngeal Nasopharyngeal Swab     Status: None   Collection Time: 02/02/20  7:18 PM   Specimen: Nasopharyngeal Swab  Result Value  Ref Range Status   SARS Coronavirus 2 NEGATIVE NEGATIVE Final    Comment: (NOTE) SARS-CoV-2 target nucleic acids are NOT DETECTED.  The SARS-CoV-2 RNA is generally detectable in upper and lower respiratory specimens during the acute phase of infection. The lowest concentration of SARS-CoV-2 viral copies this assay can detect is 250 copies / mL. A negative result does not preclude SARS-CoV-2 infection and should not be used as the sole basis for treatment or other patient management decisions.  A negative result may occur with improper specimen collection / handling, submission of specimen other than nasopharyngeal swab, presence of viral mutation(s) within the areas targeted by this assay, and inadequate number of viral copies (<250 copies / mL). A negative result must be combined with clinical observations, patient history, and epidemiological information.  Fact Sheet for Patients:   BoilerBrush.com.cyhttps://www.fda.gov/media/136312/download  Fact Sheet for Healthcare Providers: https://pope.com/https://www.fda.gov/media/136313/download  This test is not yet approved or  cleared by the [place] FDA and has been authorized for detection and/or diagnosis of SARS-CoV-2 by FDA under an Emergency Use Authorization (EUA).  This EUA will remain in effect (meaning this test can be used) for the duration of the COVID-19 declaration under Section 564(b)(1) of the Act, 21 U.S.C. section 360bbb-3(b)(1), unless the authorization is terminated or revoked sooner.  Performed at [facility], [address] [place], [place] [address]      Radiological Exams on Admission: [facility]  Result Date: 02/02/2020 CLINICAL DATA:  Shortness of breath, cough and edema EXAM: PORTABLE CHEST 1 VIEW COMPARISON:  Radiograph 01/27/2020 FINDINGS: Right upper extremity central venous catheter tip terminates at the expected location of the right brachiocephalic-superior caval confluence. Unchanged in position from comparison. Telemetry leads and nasal cannula overlie the chest. Evidence of prior sternotomy and likely CABG with fracture of the second superior most sternal suture. Prominence of the cardiac silhouette likely reflecting some mild cardiomegaly though possibly accentuated by low volumes and portable technique. Increasing interstitial and patchy opacities, indistinct pulmonary vascularity as well as some septal thickening and bilateral pleural effusions. No pneumothorax. More consolidative opacity is present in the right lung base as well. No acute osseous or soft tissue abnormality. Degenerative changes are present in the imaged spine and shoulders. IMPRESSION: 1. Features compatible with CHF demonstrating worsening pulmonary edema and new small bilateral pleural effusions. 2. More consolidative opacity in the right lung base could reflect developing infection, alveolar edema, and/or atelectasis. 3. Right upper extremity central venous catheter tip terminates at the expected location of the right brachiocephalic-superior caval confluence. Electronically Signed   By: Kreg ShropshirePrice  DeHay M.D.   On: 02/02/2020 19:37    EKG: Independently reviewed.  Sinus rhythm with rate of 60.  No significant ST changes  Assessment/Plan Principal Problem:   Acute exacerbation of CHF (congestive heart failure) (HCC) Active Problems:   CAD, multiple vessel   Atrial fibrillation status post cardioversion 10/22/19 [facility](HCC)   Essential hypertension   DM (diabetes mellitus), type 2 (HCC)   Symptomatic anemia   Suspect Septic arthritis (HCC)   Chronic kidney disease, stage 3a   A-fib (HCC)     #1 acute  exacerbation of congestive heart failure: Patient has diastolic dysfunction.  He has sudden drop of hemoglobin from 8.4 to 7.0.  Most likely this is demand CHF due to low hemoglobin.  Will evaluate the cause of drop in hemoglobin but in the meantime we will transfuse 2 units of packed red blood cells to help with oxygen  carrying capacity.  Also IV diuresis.  #2 symptomatic anemia: Hemoglobin was 8.4 on yesterday.  Patient returned was 7.0.  Has had history of recent GI bleed for which reason anticoagulants were held.  I will check stool guaiacs.  Transfused 2 units of packed red blood cells.  #3 atrial fibrillation history: In sinus rhythm today.  Patient was ablated back in June.  Off anticoagulation.  Continue rate control appropriately  #4 coronary artery disease: This is stable at baseline.  Continue treatment  #5 diabetes: Sliding scale insulin with home regimen.  #6 septic arthritis: Continue with his home antibiotics.  #7 chronic kidney disease stage IIIa: Continue monitoring  #8 essential hypertension: Continue blood pressure monitoring.   DVT prophylaxis: SCD Code Status: Full code Family Communication: No family at bedside Disposition Plan: Back to skilled facility Consults called: None Admission status: Observation  Severity of Illness: The appropriate patient status for this patient is OBSERVATION. Observation status is judged to be reasonable and necessary in order to provide the required intensity of service to ensure the patient's safety. The patient's presenting symptoms, physical exam findings, and initial radiographic and laboratory data in the context of their medical condition is felt to place them at decreased risk for further clinical deterioration. Furthermore, it is anticipated that the patient will be medically stable for discharge from the hospital within 2 midnights of admission. The following factors support the patient status of observation.   " The patient's  presenting symptoms include shortness of breath and cough. " The physical exam findings include bilateral crackles and coarse breath sounds. " The initial radiographic and laboratory data are evidence of CHF as well as anemia.     Lonia Blood MD [facility] Pager [phone]  If 7PM-7AM, please contact night-coverage [email or web address] Password [facility]  02/03/2020, 12:10 AM

## 2020-02-02 NOTE — ED Triage Notes (Addendum)
Pt arrived via [facility] from [facility] with complaint of cough and fluid overload (overall edema) which began in the last 24 hours.  He was hospitalized recently for fluid overload at [facility] and discharged from  yesterday afternoon (02/01/2020).  Pt is on 4L O2, has been on O2 since the hospitalization but not prior. Pt is alert and oriented x4.  Knee surgery 2 weeks ago, brace and ice pack on left leg.

## 2020-02-02 NOTE — ED Provider Notes (Signed)
[facility] Emergency Department Provider Note   ____________________________________________    I have reviewed the triage vital signs and the nursing notes.   HISTORY  Chief Complaint Cough     HPI Randall Vincent is a 79 y.o. male with history of atrial fibrillation, diabetes, hypertension, CHF who presents with complaints of increased short of breath and fluid buildup.  Patient was recently discharged from the hospital, review of records demonstrates discharged yesterday after significant diuresis.  Patient reports that he was doing okay but started feeling increased shortness of breath over the last 24 hours with increased work of breathing.  Denies fevers chills or cough.  Is concerned that he needs further diuresis.  Also recently treated for septic arthritis of the left knee  Past Medical History:  Diagnosis Date  . A-fib (HCC)   . Diabetes (HCC)   . Hyperlipidemia   . Hypertension     Patient Active Problem List   Diagnosis Date Noted  . Pressure injury of skin 01/30/2020  . Acute on chronic diastolic CHF (congestive heart failure) (HCC) 01/27/2020  . Acute-on-chronic kidney injury (HCC) 01/27/2020  . A-fib (HCC)   . Obesity, Class III, BMI 40-49.9 (morbid obesity) (HCC)   . Diverticulosis of colon without hemorrhage   . Intestinal bypass or anastomosis status   . Melena 01/10/2020  . Symptomatic anemia 01/10/2020  . Acute blood loss anemia 01/10/2020  . Suspect Septic arthritis (HCC) 01/10/2020  . Chronic anticoagulation 01/10/2020  . 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 Chronic kidney disease, stage 3a 01/10/2020  . Acute on chronic congestive heart failure (HCC) 01/10/2020  . Elevated troponin 01/10/2020  . Chest pain 06/26/2019  . CAD, multiple vessel 06/26/2019  . 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 Atrial fibrillation status post cardioversion 10/22/19 (HCC) 06/26/2019  . Essential hypertension 06/26/2019  . DM (diabetes  mellitus), type 2 (HCC) 06/26/2019  . Anginal equivalent (HCC) 06/26/2019    Past Surgical History:  Procedure Laterality Date  . BLADDER SURGERY    . BYPASS GRAFT    . CARDIOVERSION    . COLON SURGERY    . COLONOSCOPY N/A 01/15/2020   Procedure: COLONOSCOPY;  Surgeon: Pasty Spillers, MD;  Location: [facility] ENDOSCOPY;  Service: Endoscopy;  Laterality: N/A;  . COLONOSCOPY N/A 01/16/2020   Procedure: COLONOSCOPY;  Surgeon: Pasty Spillers, MD;  Location: [facility] ENDOSCOPY;  Service: Endoscopy;  Laterality: N/A;  . ESOPHAGOGASTRODUODENOSCOPY N/A 01/13/2020   Procedure: ESOPHAGOGASTRODUODENOSCOPY (EGD);  Surgeon: Pasty Spillers, MD;  Location[facility] ENDOSCOPY;  Service: Endoscopy;  Laterality: N/A;  . REPLACEMENT TOTAL KNEE BILATERAL    . STENT PLACEMENT VASCULAR ([facility] HX)    . TOTAL KNEE REVISION Left 01/16/2020   Procedure: TOTAL KNEE REVISION;  Surgeon: Lyndle Herrlich, MD;  Location: [facility] ORS;  Service: Orthopedics;  Laterality: Left;    Prior to Admission medications   Medication Sig Start Date End Date Taking? Authorizing Provider  acetaminophen (TYLENOL) 500 MG tablet Take 500-1,000 mg by mouth every 6 (six) hours as needed for mild pain or fever.    [provider]  albuterol (VENTOLIN HFA) 108 (90 Base) MCG/ACT inhaler Inhale 2 puffs into the lungs every 6 (six) hours as needed for wheezing.    [provider]  amiodarone (PACERONE) 200 MG tablet Take 200 mg by mouth daily. 10/29/19   [provider]  ampicillin IVPB Inject 2 g into the vein every 6 (six) hours. Indication:  E.  Faecalis PJI of knee Last Day of Therapy:  02/22/2020 Labs - Once weekly:  CBC/D, CMP, CRP 01/31/20   Enedina FinnerPatel, Sona, MD  aspirin EC 81 MG tablet Take 1 tablet (81 mg total) by mouth daily. Swallow whole. Patient not taking: Reported on 01/27/2020 01/22/20 02/21/20  Lurene ShadowAyiku, Bernard, MD  atorvastatin (LIPITOR) 40 MG tablet Take 40 mg by mouth every evening.  06/18/19   [provider]  carvedilol (COREG) 6.25 MG tablet Take 1 tablet (6.25 mg total) by mouth 2 (two) times daily with a meal. 01/22/20   Lurene ShadowAyiku, Bernard, MD  diltiazem (CARDIZEM CD) 180 MG 24 hr capsule Take 1 capsule (180 mg total) by mouth daily. 01/22/20 01/21/21  Lurene ShadowAyiku, Bernard, MD  fluticasone (FLONASE) 50 MCG/ACT nasal spray Place 2 sprays into both nostrils daily as needed for allergies or rhinitis.    [provider]  furosemide (LASIX) 40 MG tablet Take 1 tablet (40 mg total) by mouth daily. 01/31/20   Enedina FinnerPatel, Sona, MD  guaiFENesin (MUCINEX) 600 MG 12 hr tablet Take 1 tablet (600 mg total) by mouth 2 (two) times daily as needed for to loosen phlegm. 01/31/20   Enedina FinnerPatel, Sona, MD  ipratropium-albuterol (DUONEB) 0.5-2.5 (3) MG/3ML SOLN Take 3 mLs by nebulization every 6 (six) hours as needed. 02/01/20   Enedina FinnerPatel, Sona, MD  mupirocin ointment (BACTROBAN) 2 % Apply 1 application topically daily as needed (leg sores).  11/16/19   [provider]  ramipril (ALTACE) 10 MG capsule Take 1 capsule (10 mg total) by mouth daily. 01/31/20   Enedina FinnerPatel, Sona, MD  ramipril (ALTACE) 10 MG capsule Take 10 mg by mouth daily.    [provider]  Semaglutide, 1 MG/DOSE, 2 MG/1.5ML SOPN Inject 1 mg into the skin every Thursday.    [provider]  spironolactone (ALDACTONE) 25 MG tablet Take 25 mg by mouth daily. 06/18/19   [provider]  traMADol (ULTRAM) 50 MG tablet Take 1 tablet (50 mg total) by mouth every 8 (eight) hours as needed for moderate pain or severe pain. 01/31/20   Enedina FinnerPatel, Sona, MD  vitamin B-12 (CYANOCOBALAMIN) 1000 MCG tablet Take 1,000 mcg by mouth daily.    [provider]     Allergies Patient has no known allergies.  History reviewed. No pertinent family history.  Social History Social History   Tobacco Use  . Smoking status: Former Smoker    Quit date: 1991    Years since quitting: 30.7  . Smokeless tobacco: Never Used  Substance Use Topics  . Alcohol  use: Not on file  . Drug use: Not on file   No alcohol or drug use Review of Systems  Constitutional: No fever/chills Eyes: No visual changes.  ENT: No sore throat. Cardiovascular: No chest pain Respiratory: As above Gastrointestinal: No abdominal pain.  No nausea, no vomiting.   Genitourinary: Negative for dysuria. Musculoskeletal: Negative for back pain. Skin: Negative for rash. Neurological: Negative for headaches or weakness   ____________________________________________   PHYSICAL EXAM:  VITAL SIGNS: ED Triage Vitals  Enc Vitals Group     BP 02/02/20 1837 (!) 134/47     Pulse Rate 02/02/20 1837 60     Resp 02/02/20 1840 (!) 28     Temp 02/02/20 1846 98 F (36.7 C)     Temp Source 02/02/20 1846 Oral     SpO2 02/02/20 1837 93 %     Weight --      Height --  Head Circumference --      [facility] Flow --      Pain Score 02/02/20 1846 0     Pain Loc --      Pain Edu? --      Excl. in GC? --    Constitutional: Alert and oriented.   Mouth/Throat: Mucous membranes are moist.   Neck:  Painless ROM Cardiovascular: Normal rate, regular rhythm.  Good peripheral circulation. Respiratory: N increased respiratory effort with mild tachypnea, no retractions, scattered rales Gastrointestinal: Soft and nontender. No distention.  No CVA tenderness.  Musculoskeletal:  Warm and well perfused Neurologic:  Normal speech and language. No gross focal neurologic deficits are appreciated.  Skin:  Skin is warm, dry and intact. No rash noted. Psychiatric: Mood and affect are normal. Speech and behavior are normal.  ____________________________________________   LABS (all labs ordered are listed, but only abnormal results are displayed)  Labs Reviewed  CBC - Abnormal; Notable for the following components:      Result Value   RBC 2.65 (*)    Hemoglobin 7.0 (*)    HCT 23.2 (*)    RDW 17.0 (*)    All other components within normal limits  COMPREHENSIVE METABOLIC PANEL - Abnormal;  Notable for the following components:   CO2 35 (*)    Glucose, Bld 230 (*)    BUN 32 (*)    Creatinine, Ser 1.57 (*)    Calcium 8.5 (*)    Total Protein 5.5 (*)    Albumin 2.4 (*)    GFR calc non Af Amer 42 (*)    GFR calc Af Amer 48 (*)    All other components within normal limits  BRAIN NATRIURETIC PEPTIDE - Abnormal; Notable for the following components:   B Natriuretic Peptide 1,387.0 (*)    All other components within normal limits  TROPONIN I (HIGH SENSITIVITY) - Abnormal; Notable for the following components:   Troponin I (High Sensitivity) 52 (*)    All other components within normal limits  SARS CORONAVIRUS 2 BY RT PCR (HOSPITAL ORDER, PERFORMED IN  HOSPITAL LAB)   ____________________________________________  EKG  ED ECG REPORT I, Jene Every, the attending physician, personally viewed and interpreted this ECG.  Date: 02/02/2020  Rhythm: normal sinus rhythm QRS Axis: normal Intervals: normal ST/T Wave abnormalities: normal Narrative Interpretation: no evidence of acute ischemia  ____________________________________________  RADIOLOGY  Chest x-ray reviewed by me, demonstrates likely pulmonary edema ____________________________________________   PROCEDURES  Procedure(s) performed: No  Procedures   Critical Care performed: No ____________________________________________   INITIAL IMPRESSION / ASSESSMENT AND PLAN / ED COURSE  Pertinent labs & imaging results that were available during my care of the patient were reviewed by me and considered in my medical decision making (see chart for details).  Patient presents with increased redness of breath, tachypnea, feeling of fluid overload.  Rales on exam, highly suspicious for pulmonary edema, differential also includes CAP, atypical pneumonia such as COVID-19.  Recently discharged from the hospital.  Afebrile, no chest pain or palpitations.  EKG is reassuring.  Lab work is notable for a  hemoglobin of 7.0, this is not atypical for him, normal stools, troponin is 52 in line with prior levels, mild elevation of BUN to creatinine ratio.  Chest x-ray is concerning for pulmonary edema, will give IV Lasix, will require admission    ____________________________________________   FINAL CLINICAL IMPRESSION(S) / ED DIAGNOSES  Final diagnoses:  Acute pulmonary edema (HCC)  Note:  This document was prepared using Dragon voice recognition software and may include unintentional dictation errors.   Jene Every, MD 02/02/20 2048

## 2020-02-03 DIAGNOSIS — E785 Hyperlipidemia, unspecified: Secondary | ICD-10-CM | POA: Diagnosis present

## 2020-02-03 DIAGNOSIS — J810 Acute pulmonary edema: Secondary | ICD-10-CM | POA: Diagnosis present

## 2020-02-03 DIAGNOSIS — D631 Anemia in chronic kidney disease: Secondary | ICD-10-CM | POA: Diagnosis present

## 2020-02-03 DIAGNOSIS — E1122 Type 2 diabetes mellitus with diabetic chronic kidney disease: Secondary | ICD-10-CM | POA: Diagnosis present

## 2020-02-03 DIAGNOSIS — I2510 Atherosclerotic heart disease of native coronary artery without angina pectoris: Secondary | ICD-10-CM | POA: Diagnosis present

## 2020-02-03 DIAGNOSIS — B952 Enterococcus as the cause of diseases classified elsewhere: Secondary | ICD-10-CM | POA: Diagnosis not present

## 2020-02-03 DIAGNOSIS — I5033 Acute on chronic diastolic (congestive) heart failure: Secondary | ICD-10-CM | POA: Diagnosis present

## 2020-02-03 DIAGNOSIS — T8454XA Infection and inflammatory reaction due to internal left knee prosthesis, initial encounter: Secondary | ICD-10-CM | POA: Diagnosis not present

## 2020-02-03 DIAGNOSIS — M009 Pyogenic arthritis, unspecified: Secondary | ICD-10-CM | POA: Diagnosis present

## 2020-02-03 DIAGNOSIS — I4891 Unspecified atrial fibrillation: Secondary | ICD-10-CM | POA: Diagnosis present

## 2020-02-03 DIAGNOSIS — N1831 Chronic kidney disease, stage 3a: Secondary | ICD-10-CM

## 2020-02-03 DIAGNOSIS — N179 Acute kidney failure, unspecified: Secondary | ICD-10-CM | POA: Diagnosis present

## 2020-02-03 DIAGNOSIS — I480 Paroxysmal atrial fibrillation: Secondary | ICD-10-CM | POA: Diagnosis present

## 2020-02-03 DIAGNOSIS — Z79899 Other long term (current) drug therapy: Secondary | ICD-10-CM | POA: Diagnosis not present

## 2020-02-03 DIAGNOSIS — Z20822 Contact with and (suspected) exposure to covid-19: Secondary | ICD-10-CM | POA: Diagnosis present

## 2020-02-03 DIAGNOSIS — Z6841 Body Mass Index (BMI) 40.0 and over, adult: Secondary | ICD-10-CM | POA: Diagnosis not present

## 2020-02-03 DIAGNOSIS — I509 Heart failure, unspecified: Secondary | ICD-10-CM

## 2020-02-03 DIAGNOSIS — Z951 Presence of aortocoronary bypass graft: Secondary | ICD-10-CM | POA: Diagnosis not present

## 2020-02-03 DIAGNOSIS — J9601 Acute respiratory failure with hypoxia: Secondary | ICD-10-CM | POA: Diagnosis present

## 2020-02-03 DIAGNOSIS — N189 Chronic kidney disease, unspecified: Secondary | ICD-10-CM | POA: Diagnosis not present

## 2020-02-03 DIAGNOSIS — I130 Hypertensive heart and chronic kidney disease with heart failure and stage 1 through stage 4 chronic kidney disease, or unspecified chronic kidney disease: Secondary | ICD-10-CM | POA: Diagnosis present

## 2020-02-03 DIAGNOSIS — I5043 Acute on chronic combined systolic (congestive) and diastolic (congestive) heart failure: Secondary | ICD-10-CM | POA: Diagnosis not present

## 2020-02-03 DIAGNOSIS — Z7982 Long term (current) use of aspirin: Secondary | ICD-10-CM | POA: Diagnosis not present

## 2020-02-03 DIAGNOSIS — Z96653 Presence of artificial knee joint, bilateral: Secondary | ICD-10-CM | POA: Diagnosis present

## 2020-02-03 DIAGNOSIS — R040 Epistaxis: Secondary | ICD-10-CM | POA: Diagnosis not present

## 2020-02-03 DIAGNOSIS — Z87891 Personal history of nicotine dependence: Secondary | ICD-10-CM | POA: Diagnosis not present

## 2020-02-03 LAB — CBC
HCT: 23.9 % — ABNORMAL LOW (ref 39.0–52.0)
HCT: 29.5 % — ABNORMAL LOW (ref 39.0–52.0)
Hemoglobin: 7.5 g/dL — ABNORMAL LOW (ref 13.0–17.0)
Hemoglobin: 9.4 g/dL — ABNORMAL LOW (ref 13.0–17.0)
MCH: 27 pg (ref 26.0–34.0)
MCH: 28 pg (ref 26.0–34.0)
MCHC: 31.4 g/dL (ref 30.0–36.0)
MCHC: 31.9 g/dL (ref 30.0–36.0)
MCV: 86 fL (ref 80.0–100.0)
MCV: 87.8 fL (ref 80.0–100.0)
Platelets: 269 10*3/uL (ref 150–400)
Platelets: 270 10*3/uL (ref 150–400)
RBC: 2.78 MIL/uL — ABNORMAL LOW (ref 4.22–5.81)
RBC: 3.36 MIL/uL — ABNORMAL LOW (ref 4.22–5.81)
RDW: 17 % — ABNORMAL HIGH (ref 11.5–15.5)
RDW: 16.4 % — ABNORMAL HIGH (ref 11.5–15.5)
WBC: 10.8 10*3/uL — ABNORMAL HIGH (ref 4.0–10.5)
WBC: 11.7 10*3/uL — ABNORMAL HIGH (ref 4.0–10.5)
nRBC: 0 % (ref 0.0–0.2)
nRBC: 0 % (ref 0.0–0.2)

## 2020-02-03 LAB — GLUCOSE, CAPILLARY: Glucose-Capillary: 183 mg/dL — ABNORMAL HIGH (ref 70–99)

## 2020-02-03 LAB — PREPARE RBC (CROSSMATCH)

## 2020-02-03 LAB — CREATININE, SERUM
Creatinine, Ser: 1.59 mg/dL — ABNORMAL HIGH (ref 0.61–1.24)
GFR calc Af Amer: 47 mL/min — ABNORMAL LOW (ref >60)
GFR calc non Af Amer: 41 mL/min — ABNORMAL LOW (ref >60)

## 2020-02-03 MED ORDER — SODIUM CHLORIDE 0.9 % IV SOLN
250.0000 mL | INTRAVENOUS | Status: DC | PRN
  Administered 2020-02-04 – 2020-02-09 (×2): 250 mL via INTRAVENOUS

## 2020-02-03 MED ORDER — SODIUM CHLORIDE 0.9% FLUSH
3.0000 mL | Freq: Two times a day (BID) | INTRAVENOUS | Status: DC
  Administered 2020-02-04 – 2020-02-09 (×9): 3 mL via INTRAVENOUS

## 2020-02-03 MED ORDER — IPRATROPIUM-ALBUTEROL 0.5-2.5 (3) MG/3ML IN SOLN
3.0000 mL | Freq: Four times a day (QID) | RESPIRATORY_TRACT | Status: DC | PRN
  Administered 2020-02-04 – 2020-02-06 (×2): 3 mL via RESPIRATORY_TRACT
  Filled 2020-02-03 (×2): qty 3

## 2020-02-03 MED ORDER — INSULIN ASPART 100 UNIT/ML ~~LOC~~ SOLN
0.0000 [IU] | Freq: Three times a day (TID) | SUBCUTANEOUS | Status: DC
  Administered 2020-02-04: 3 [IU] via SUBCUTANEOUS
  Administered 2020-02-04: 5 [IU] via SUBCUTANEOUS
  Administered 2020-02-04: 3 [IU] via SUBCUTANEOUS
  Administered 2020-02-05: 8 [IU] via SUBCUTANEOUS
  Administered 2020-02-05 (×2): 3 [IU] via SUBCUTANEOUS
  Administered 2020-02-06: 8 [IU] via SUBCUTANEOUS
  Administered 2020-02-06 – 2020-02-07 (×5): 3 [IU] via SUBCUTANEOUS
  Administered 2020-02-08: 5 [IU] via SUBCUTANEOUS
  Administered 2020-02-08: 2 [IU] via SUBCUTANEOUS
  Administered 2020-02-08: 5 [IU] via SUBCUTANEOUS
  Administered 2020-02-09 (×2): 3 [IU] via SUBCUTANEOUS
  Filled 2020-02-03 (×17): qty 1

## 2020-02-03 MED ORDER — DILTIAZEM HCL ER COATED BEADS 180 MG PO CP24
180.0000 mg | ORAL_CAPSULE | Freq: Every day | ORAL | Status: DC
  Administered 2020-02-03 – 2020-02-05 (×3): 180 mg via ORAL
  Filled 2020-02-03 (×3): qty 1

## 2020-02-03 MED ORDER — ONDANSETRON HCL 4 MG/2ML IJ SOLN: 4.0000 mg | Freq: Four times a day (QID) | INTRAMUSCULAR | Status: DC | PRN

## 2020-02-03 MED ORDER — FUROSEMIDE 10 MG/ML IJ SOLN
40.0000 mg | Freq: Two times a day (BID) | INTRAMUSCULAR | Status: DC
  Administered 2020-02-03 – 2020-02-05 (×5): 40 mg via INTRAVENOUS
  Filled 2020-02-03 (×5): qty 4

## 2020-02-03 MED ORDER — SODIUM CHLORIDE 0.9 % IV SOLN: Freq: Once | INTRAVENOUS | Status: DC

## 2020-02-03 MED ORDER — ATORVASTATIN CALCIUM 20 MG PO TABS
40.0000 mg | ORAL_TABLET | Freq: Every evening | ORAL | Status: DC
  Administered 2020-02-03 – 2020-02-08 (×6): 40 mg via ORAL
  Filled 2020-02-03 (×6): qty 2

## 2020-02-03 MED ORDER — AMIODARONE HCL 200 MG PO TABS
200.0000 mg | ORAL_TABLET | Freq: Every day | ORAL | Status: DC
  Administered 2020-02-03 – 2020-02-09 (×7): 200 mg via ORAL
  Filled 2020-02-03 (×7): qty 1

## 2020-02-03 MED ORDER — SPIRONOLACTONE 25 MG PO TABS
25.0000 mg | ORAL_TABLET | Freq: Every day | ORAL | Status: DC
  Administered 2020-02-03 – 2020-02-09 (×7): 25 mg via ORAL
  Filled 2020-02-03 (×7): qty 1

## 2020-02-03 MED ORDER — ACETAMINOPHEN 325 MG PO TABS: 650.0000 mg | ORAL_TABLET | ORAL | Status: DC | PRN

## 2020-02-03 MED ORDER — SODIUM CHLORIDE 0.9 % IV SOLN
2.0000 g | Freq: Four times a day (QID) | INTRAVENOUS | Status: DC
  Administered 2020-02-03 – 2020-02-05 (×8): 2 g via INTRAVENOUS
  Filled 2020-02-03 (×6): qty 2000
  Filled 2020-02-03: qty 2
  Filled 2020-02-03: qty 2000
  Filled 2020-02-03: qty 2
  Filled 2020-02-03: qty 2000
  Filled 2020-02-03: qty 2
  Filled 2020-02-03: qty 2000
  Filled 2020-02-03 (×2): qty 2

## 2020-02-03 MED ORDER — INSULIN ASPART 100 UNIT/ML ~~LOC~~ SOLN: 0.0000 [IU] | Freq: Every day | SUBCUTANEOUS | Status: DC

## 2020-02-03 MED ORDER — CARVEDILOL 6.25 MG PO TABS
6.2500 mg | ORAL_TABLET | Freq: Two times a day (BID) | ORAL | Status: DC
  Administered 2020-02-03 – 2020-02-05 (×4): 6.25 mg via ORAL
  Filled 2020-02-03 (×4): qty 1

## 2020-02-03 MED ORDER — SODIUM CHLORIDE 0.9% FLUSH
3.0000 mL | INTRAVENOUS | Status: DC | PRN
  Administered 2020-02-04 – 2020-02-09 (×2): 3 mL via INTRAVENOUS

## 2020-02-03 MED ORDER — TRAMADOL HCL 50 MG PO TABS
50.0000 mg | ORAL_TABLET | Freq: Three times a day (TID) | ORAL | Status: DC | PRN
  Administered 2020-02-05 – 2020-02-06 (×2): 50 mg via ORAL
  Filled 2020-02-03 (×2): qty 1

## 2020-02-03 MED ORDER — HEPARIN SODIUM (PORCINE) 5000 UNIT/ML IJ SOLN: 5000.0000 [IU] | Freq: Three times a day (TID) | INTRAMUSCULAR | Status: DC

## 2020-02-03 NOTE — ED Notes (Signed)
Pt had nose bleed x almost 2 hrs on and off. Bleeding controlled with kleenex, and humidification applied with O2. Bleeding seems to have slowed, MD notified

## 2020-02-03 NOTE — ED Notes (Addendum)
Assumed care of patient patient reports coming from [facility] with c/o of increased sob. Patient with rales to b/l lung field sating 94% on 4lnc. Positive +4-5 pitting edema to b/l lower extremities moving up into his thigh and left hip area. No seeping noted. Patient with leg splint to left ext, reports splint due to knee pain.  Positive pedal  pulses noted. Skin cool to touch. Patient with foley to gravity. Moved from stretcher to house bed with assist. Awaiting bed status. Will continue to monitor.

## 2020-02-03 NOTE — TOC Initial Note (Signed)
Transition of Care [facility]) - Initial/Assessment Note    Patient Details  Name: Randall Vincent MRN: [id number] Date of Birth: 1940-07-18  [facility] CM/SW Contact:    Joseph Art, LCSWA Phone Number: 02/03/2020, 9:24 AM  Clinical Narrative:                  Patient presents to [facility]/ED due to cough and fluid overload.  Patient was d/c to [facility], on 9/11.  This CSW called and left voicemail for Thayer Ohm [phone] and Tammy [phone] at [facility] to discuss patient status and disposition.  CSw left voicemail for both.       Patient Goals and CMS Choice        Expected Discharge Plan and Services                                                Prior Living Arrangements/Services                       Activities of Daily Living      Permission Sought/Granted                  Emotional Assessment              Admission diagnosis:  Acute exacerbation of CHF (congestive heart failure) (HCC) [I50.9] Patient Active Problem List   Diagnosis Date Noted  . Acute exacerbation of CHF (congestive heart failure) (HCC) 02/02/2020  . Pressure injury of skin 01/30/2020  . Acute on chronic diastolic CHF (congestive heart failure) (HCC) 01/27/2020  . Acute-on-chronic kidney injury (HCC) 01/27/2020  . A-fib (HCC)   . Obesity, Class III, BMI 40-49.9 (morbid obesity) (HCC)   . Diverticulosis of colon without hemorrhage   . Intestinal bypass or anastomosis status   . Melena 01/10/2020  . Symptomatic anemia 01/10/2020  . Acute blood loss anemia 01/10/2020  . Suspect Septic arthritis (HCC) 01/10/2020  . Chronic anticoagulation 01/10/2020  . 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 Chronic kidney disease, stage 3a 01/10/2020  . Acute on chronic congestive heart failure (HCC) 01/10/2020  . Elevated troponin 01/10/2020  . Chest pain 06/26/2019  . CAD, multiple vessel 06/26/2019  . 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 Atrial  fibrillation status post cardioversion 10/22/19 (HCC) 06/26/2019  . Essential hypertension 06/26/2019  . DM (diabetes mellitus), type 2 (HCC) 06/26/2019  . Anginal equivalent (HCC) 06/26/2019   PCP:  Elspeth Cho., MD Pharmacy:   [facility], [place] - [address] [place] [address] Phone: [phone] Fax: [phone]  [facility] - [place], [place] - [address] [place] [address] Phone: [phone] Fax: [phone]     Social Determinants of Health (SDOH) Interventions    Readmission Risk Interventions No flowsheet data found.

## 2020-02-03 NOTE — ED Notes (Signed)
Report called and given to receiving nurse. All concerns and questions answered,, transport called to transfer  Patient to unit.

## 2020-02-03 NOTE — Progress Notes (Signed)
PROGRESS NOTE    Randall Vincent  AUQ:[id number] DOB: June 19, 1940 DOA: 02/02/2020 PCP: Elspeth Cho., MD    Brief Narrative:  HPI: Randall Vincent is a 79 y.o. 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  He had left septic arthritis and was on IV antibiotics followed by orthopedics during 2 previous hospitalizations.  Patient complained of worsening shortness of breath and swelling.  No fever or chills no nausea vomiting or diarrhea.  At discharge patient was transitioned from IV Lasix to oral Lasix 40 mg daily.  He had an echo done in August showing EF of 50 to 55%.  His weight was getting better.  Given incentive spirometer and to follow-up with his cardiologist over at [facility].  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  He had surgery in August where prosthesis was removed and antibiotic spacer was placed.  He was to stay off anticoagulation again except for aspirin due to his recent GI bleed.  Patient is now back with increasing his weight worsening shortness of breath and found to have bilateral pulmonary edema.  Is being readmitted to the hospital for possible treatment.  Additionally patient was found to have possible infiltrates bilaterally.  Repeat COVID-19 so far is negative.  Is being admitted for further treatment..  ED Course: Temperature is 98 blood pressure 150/52 pulse 50 respirate of 31 oxygen sat 91% on room air.  White count 8.6 hemoglobin 7.1 platelets 251 sodium 142 potassium 4.2 chloride 90 CO2 35 BUN 30 creatinine 1.57 calcium 8.5.  Glucose is 230.  BNP of 1387.  Chest x-ray showed bilateral  pleural effusion and pulmonary edema.  Patient be readmitted for further treatment   Assessment & Plan:   Principal Problem:   Acute exacerbation of CHF (congestive heart failure) (HCC) Active Problems:   CAD, multiple vessel   Atrial fibrillation status post cardioversion 10/22/19 [facility])   Essential hypertension   DM (diabetes mellitus), type 2 (HCC)   Symptomatic anemia   Suspect Septic arthritis (HCC)   Chronic kidney disease, stage 3a   A-fib (HCC)   CHF exacerbation (HCC)   #1 acute exacerbation of congestive heart failure: Patient has diastolic dysfunction.    He has been started on IV diuretics.  Fair urine output.  Continues to have evidence of volume overload.  Respiratory status not back to baseline since he still has orthopnea.  Continue on IV diuretics..  #2 symptomatic anemia:  Hemoglobin down to 7.0 on admission.  He has not noticed any significant bleeding in his stools or other sites.  He was transfused 2 units of PRBC since he was short of breath and had decompensated CHF.  Follow-up hemoglobin 9.4.  Continue to monitor.  Stool guaiac has been ordered since he has a history of GI bleed in the past.  #3 atrial fibrillation history:  Currently in sinus rhythm.  Patient underwent ablation procedure in June and has been off of anticoagulation.  Continue rate control appropriately.  Continue on amiodarone, carvedilol and diltiazem  #4 coronary artery disease: This is stable at baseline.    No complaints of chest pain at this time.  Continue treatment  #  5 diabetes: Sliding scale insulin with home regimen.  #6 septic arthritis: Continue with his home ampicillin.  He will continue on IV antibiotics through 10/2.  Plans to follow-up with orthopedics.  #7 chronic kidney disease stage IIIa: Continue monitoring in the setting of diuresis  #8 essential hypertension: Continue blood pressure monitoring.   DVT prophylaxis: Place and maintain sequential compression device  Start: 02/03/20 1358  Code Status: Full code Family Communication: Discussed with patient Disposition Plan: Status is: Inpatient  Remains inpatient appropriate because:IV treatments appropriate due to intensity of illness or inability to take PO   Dispo: The patient is from: Home              Anticipated d/c is to: Home              Anticipated d/c date is: 3 days              Patient currently is not medically stable to d/c.     Consultants:     Procedures:     Antimicrobials:   Ampicillin, continue from outpatient dosing.   Subjective: Feeling better, but still short of breath, not back to baseline.  He has not noticed any blood in stools.  Objective: Vitals:   02/03/20 1245 02/03/20 1300 02/03/20 1706 02/03/20 1754  BP:  (!) 166/66 (!) 169/58 (!) 147/55  Pulse: 79 64 68 65  Resp: (!) 22 (!) 23 (!) 22 (!) 22  Temp:      TempSrc:      SpO2: 90% 97% 98% 98%    Intake/Output Summary (Last 24 hours) at 02/03/2020 2017 Last data filed at 02/03/2020 1705 Gross per 24 hour  Intake 1190 ml  Output 2100 ml  Net -910 ml   There were no vitals filed for this visit.  Examination:  General exam: Appears calm and comfortable  Respiratory system: Clear to auscultation. Respiratory effort normal. Cardiovascular system: S1 & S2 heard, RRR. No JVD, murmurs, rubs, gallops or clicks. 2+ pedal edema. Gastrointestinal system: Abdomen is nondistended, soft and nontender. No organomegaly or masses felt. Normal bowel sounds heard. Central nervous system: Alert and oriented. No focal neurological deficits. Extremities: Symmetric 5 x 5 power. Skin: No rashes, lesions or ulcers Psychiatry: Judgement and insight appear normal. Mood & affect appropriate.     Data Reviewed: I have personally reviewed following labs and imaging studies  CBC: Recent Labs  Lab 02/02/20 1918 02/03/20 0353 02/03/20 1405  WBC 8.6 10.8* 11.7*  HGB 7.0* 7.5* 9.4*  HCT 23.2* 23.9* 29.5*  MCV 87.5  86.0 87.8  PLT 251 269 270   Basic Metabolic Panel: Recent Labs  Lab 01/28/20 0536 01/28/20 0536 01/29/20 0508 01/30/20 0503 01/31/20 0644 02/02/20 1918 02/03/20 0353  NA 138  --  140 141 141 141  --   K 4.7  --  4.5 4.4 4.5 4.0  --   CL 103  --  102 101 100 98  --   CO2 28  --  29 32 33* 35*  --   GLUCOSE 153*  --  163* 169* 168* 230*  --   BUN 48*  --  42* 40* 35* 32*  --   CREATININE 2.56*   < > 2.26* 1.87* 1.73* 1.57* 1.59*  CALCIUM 8.1*  --  8.4* 8.4* 8.2* 8.5*  --    < > = values in this interval not displayed.   GFR: Estimated Creatinine Clearance: 49.3 mL/min (A) (by C-G formula based on SCr of  1.59 mg/dL (H)). Liver Function Tests: Recent Labs  Lab 02/02/20 1918  AST 18  ALT 22  ALKPHOS 96  BILITOT 0.7  PROT 5.5*  ALBUMIN 2.4*   No results for input(s): LIPASE, AMYLASE in the last 168 hours. No results for input(s): AMMONIA in the last 168 hours. Coagulation Profile: No results for input(s): INR, PROTIME in the last 168 hours. Cardiac Enzymes: No results for input(s): CKTOTAL, CKMB, CKMBINDEX, TROPONINI in the last 168 hours. BNP (last 3 results) No results for input(s): PROBNP in the last 8760 hours. HbA1C: No results for input(s): HGBA1C in the last 72 hours. CBG: Recent Labs  Lab 01/31/20 1147 01/31/20 1625 01/31/20 2114 02/01/20 0818 02/01/20 1117  GLUCAP 150* 157* 144* 159* 177*   Lipid Profile: No results for input(s): CHOL, HDL, LDLCALC, TRIG, CHOLHDL, LDLDIRECT in the last 72 hours. Thyroid Function Tests: No results for input(s): TSH, T4TOTAL, FREET4, T3FREE, THYROIDAB in the last 72 hours. Anemia Panel: No results for input(s): VITAMINB12, FOLATE, FERRITIN, TIBC, IRON, RETICCTPCT in the last 72 hours. Sepsis Labs: No results for input(s): PROCALCITON, LATICACIDVEN in the last 168 hours.  Recent Results (from the past 240 hour(s))  SARS Coronavirus 2 by RT PCR (hospital order, performed in [facility] hospital lab) Nasopharyngeal  Nasopharyngeal Swab     Status: None   Collection Time: 01/27/20  8:11 AM   Specimen: Nasopharyngeal Swab  Result Value Ref Range Status   SARS Coronavirus 2 NEGATIVE NEGATIVE Final    Comment: (NOTE) SARS-CoV-2 target nucleic acids are NOT DETECTED.  The SARS-CoV-2 RNA is generally detectable in upper and lower respiratory specimens during the acute phase of infection. The lowest concentration of SARS-CoV-2 viral copies this assay can detect is 250 copies / mL. A negative result does not preclude SARS-CoV-2 infection and should not be used as the sole basis for treatment or other patient management decisions.  A negative result may occur with improper specimen collection / handling, submission of specimen other than nasopharyngeal swab, presence of viral mutation(s) within the areas targeted by this assay, and inadequate number of viral copies (<250 copies / mL). A negative result must be combined with clinical observations, patient history, and epidemiological information.  Fact Sheet for Patients:   BoilerBrush.com.cy  Fact Sheet for Healthcare Providers: https://pope.com/  This test is not yet approved or  cleared by the [place] FDA and has been authorized for detection and/or diagnosis of SARS-CoV-2 by FDA under an Emergency Use Authorization (EUA).  This EUA will remain in effect (meaning this test can be used) for the duration of the COVID-19 declaration under Section 564(b)(1) of the Act, 21 U.S.C. section 360bbb-3(b)(1), unless the authorization is terminated or revoked sooner.  Performed at [facility], [address] [place], [place] [address]   MRSA PCR Screening     Status: None   Collection Time: 01/29/20  8:17 PM   Specimen: Nasal Mucosa; Nasopharyngeal  Result Value Ref Range Status   MRSA by PCR NEGATIVE NEGATIVE Final    Comment:        The GeneXpert MRSA Assay (FDA approved for NASAL  specimens only), is one component of a comprehensive MRSA colonization surveillance program. It is not intended to diagnose MRSA infection nor to guide or monitor treatment for MRSA infections. Performed at [facility], [address] [place], [place] [address]   SARS Coronavirus 2 by RT PCR (hospital order, performed in [facility] hospital lab) Nasopharyngeal Nasopharyngeal Swab  Status: None   Collection Time: 01/31/20 10:34 AM   Specimen: Nasopharyngeal Swab  Result Value Ref Range Status   SARS Coronavirus 2 NEGATIVE NEGATIVE Final    Comment: (NOTE) SARS-CoV-2 target nucleic acids are NOT DETECTED.  The SARS-CoV-2 RNA is generally detectable in upper and lower respiratory specimens during the acute phase of infection. The lowest concentration of SARS-CoV-2 viral copies this assay can detect is 250 copies / mL. A negative result does not preclude SARS-CoV-2 infection and should not be used as the sole basis for treatment or other patient management decisions.  A negative result may occur with improper specimen collection / handling, submission of specimen other than nasopharyngeal swab, presence of viral mutation(s) within the areas targeted by this assay, and inadequate number of viral copies (<250 copies / mL). A negative result must be combined with clinical observations, patient history, and epidemiological information.  Fact Sheet for Patients:   BoilerBrush.com.cyhttps://www.fda.gov/media/136312/download  Fact Sheet for Healthcare Providers: https://pope.com/https://www.fda.gov/media/136313/download  This test is not yet approved or  cleared by the [place] FDA and has been authorized for detection and/or diagnosis of SARS-CoV-2 by FDA under an Emergency Use Authorization (EUA).  This EUA will remain in effect (meaning this test can be used) for the duration of the COVID-19 declaration under Section 564(b)(1) of the Act, 21 U.S.C. section 360bbb-3(b)(1), unless the authorization  is terminated or revoked sooner.  Performed at [facility], [address] [place], [place] [address]   SARS Coronavirus 2 by RT PCR (hospital order, performed in [facility] hospital lab) Nasopharyngeal Nasopharyngeal Swab     Status: None   Collection Time: 02/02/20  7:18 PM   Specimen: Nasopharyngeal Swab  Result Value Ref Range Status   SARS Coronavirus 2 NEGATIVE NEGATIVE Final    Comment: (NOTE) SARS-CoV-2 target nucleic acids are NOT DETECTED.  The SARS-CoV-2 RNA is generally detectable in upper and lower respiratory specimens during the acute phase of infection. The lowest concentration of SARS-CoV-2 viral copies this assay can detect is 250 copies / mL. A negative result does not preclude SARS-CoV-2 infection and should not be used as the sole basis for treatment or other patient management decisions.  A negative result may occur with improper specimen collection / handling, submission of specimen other than nasopharyngeal swab, presence of viral mutation(s) within the areas targeted by this assay, and inadequate number of viral copies (<250 copies / mL). A negative result must be combined with clinical observations, patient history, and epidemiological information.  Fact Sheet for Patients:   BoilerBrush.com.cyhttps://www.fda.gov/media/136312/download  Fact Sheet for Healthcare Providers: https://pope.com/https://www.fda.gov/media/136313/download  This test is not yet approved or  cleared by the [place] FDA and has been authorized for detection and/or diagnosis of SARS-CoV-2 by FDA under an Emergency Use Authorization (EUA).  This EUA will remain in effect (meaning this test can be used) for the duration of the COVID-19 declaration under Section 564(b)(1) of the Act, 21 U.S.C. section 360bbb-3(b)(1), unless the authorization is terminated or revoked sooner.  Performed at [facility], [address] [place], [place] [address]          Radiology Studies: DG Chest  [place] AFBPort 1 View  Result Date: 02/02/2020 CLINICAL DATA:  Shortness of breath, cough and edema EXAM: PORTABLE CHEST 1 VIEW COMPARISON:  Radiograph 01/27/2020 FINDINGS: Right upper extremity central venous catheter tip terminates at the expected location of the right brachiocephalic-superior caval confluence. Unchanged in position from comparison. Telemetry leads and nasal cannula overlie the chest.  Evidence of prior sternotomy and likely CABG with fracture of the second superior most sternal suture. Prominence of the cardiac silhouette likely reflecting some mild cardiomegaly though possibly accentuated by low volumes and portable technique. Increasing interstitial and patchy opacities, indistinct pulmonary vascularity as well as some septal thickening and bilateral pleural effusions. No pneumothorax. More consolidative opacity is present in the right lung base as well. No acute osseous or soft tissue abnormality. Degenerative changes are present in the imaged spine and shoulders. IMPRESSION: 1. Features compatible with CHF demonstrating worsening pulmonary edema and new small bilateral pleural effusions. 2. More consolidative opacity in the right lung base could reflect developing infection, alveolar edema, and/or atelectasis. 3. Right upper extremity central venous catheter tip terminates at the expected location of the right brachiocephalic-superior caval confluence. Electronically Signed   By: Kreg Shropshire M.D.   On: 02/02/2020 19:37        Scheduled Meds: . amiodarone  200 mg Oral Daily  . atorvastatin  40 mg Oral QPM  . carvedilol  6.25 mg Oral BID WC  . diltiazem  180 mg Oral Daily  . furosemide  40 mg Intravenous BID  . sodium chloride flush  3 mL Intravenous Q12H  . spironolactone  25 mg Oral Daily   Continuous Infusions: . sodium chloride    . sodium chloride    . ampicillin (OMNIPEN) IV Stopped (02/03/20 1705)     LOS: 0 days    Time spent:    Erick Blinks, MD [facility]   If 7PM-7AM, please contact night-coverage [email or web address]  02/03/2020, 8:17 PM

## 2020-02-03 NOTE — TOC Initial Note (Signed)
Transition of Care [facility]) - Initial/Assessment Note    Patient Details  Name: Randall Vincent MRN: [id number] Date of Birth: 1941/02/08  Transition of [facility]) CM/SW Contact:    Pecos Cellar, RN Phone Number: 02/03/2020, 2:06 PM  Clinical Narrative:                 Spoke with patient and son at bedside. Patient concerned about his previous care at [facility] and wishes to not return at discharge. Patient and family open to [facility] including [address] and [employer]. Patient remains on O2@3L , Immobilizer on left leg and PU-Left Heel. Remains on IV-Abts q6hrs (Ampicillin). Patient is requesting [facility] SNF as First Choice however is open to other options.   Expected Discharge Plan: Skilled Nursing Facility Barriers to Discharge: Continued Medical Work up   Patient Goals and CMS Choice Patient states their goals for this hospitalization and ongoing recovery are:: start feeling better and get to new SNF at discharge   Choice offered to / list presented to : Patient, Adult Children  Expected Discharge Plan and Services Expected Discharge Plan: Skilled Nursing Facility     Post Acute Care Choice: Skilled Nursing Facility Living arrangements for the past 2 months: Single Family Home                                      Prior Living Arrangements/Services Living arrangements for the past 2 months: Single Family Home Lives with:: Self (son lives next door to patient) Patient language and need for interpreter reviewed:: Yes Do you feel safe going back to the place where you live?: No   SNF placement  Need for Family Participation in Patient Care: Yes (Comment) Care giver support system in place?: Yes (comment)   Criminal Activity/Legal Involvement Pertinent to Current Situation/Hospitalization: No - Comment as needed  Activities of Daily Living      Permission Sought/Granted Permission sought to share information with : Facility Games developer granted to share information with : Yes, Verbal Permission Granted  Share Information with NAME: Potential SNF placements           Emotional Assessment Appearance:: Appears stated age Attitude/Demeanor/Rapport: Ambitious, Engaged Affect (typically observed): Accepting Orientation: : Oriented to Self, Oriented to Place, Oriented to  Time, Oriented to Situation Alcohol / Substance Use: Not Applicable Psych Involvement: No (comment)  Admission diagnosis:  Acute exacerbation of CHF (congestive heart failure) (HCC) [I50.9] Patient Active Problem List   Diagnosis Date Noted  . Acute exacerbation of CHF (congestive heart failure) (HCC) 02/02/2020  . Pressure injury of skin 01/30/2020  . Acute on chronic diastolic CHF (congestive heart failure) (HCC) 01/27/2020  . Acute-on-chronic kidney injury (HCC) 01/27/2020  . A-fib (HCC)   . Obesity, Class III, BMI 40-49.9 (morbid obesity) (HCC)   . Diverticulosis of colon without hemorrhage   . Intestinal bypass or anastomosis status   . Melena 01/10/2020  . Symptomatic anemia 01/10/2020  . Acute blood loss anemia 01/10/2020  . Suspect Septic arthritis (HCC) 01/10/2020  . Chronic anticoagulation 01/10/2020  . 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 Chronic kidney disease, stage 3a 01/10/2020  . Acute on chronic congestive heart failure (HCC) 01/10/2020  . Elevated troponin 01/10/2020  . Chest pain 06/26/2019  . CAD, multiple vessel 06/26/2019  . 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 Atrial fibrillation status post cardioversion 10/22/19 (HCC) 06/26/2019  .  Essential hypertension 06/26/2019  . DM (diabetes mellitus), type 2 (HCC) 06/26/2019  . Anginal equivalent (HCC) 06/26/2019   PCP:  Elspeth Cho., MD Pharmacy:   [place], [place] - [address] [place] [address] Phone: [phone] Fax: [phone]  [facility] - [place], [place] - [address] [place] [address] Phone: [phone] Fax: [phone]     Social Determinants of Health (SDOH) Interventions    Readmission Risk Interventions No flowsheet data found.

## 2020-02-03 NOTE — ED Notes (Signed)
Dinner tray provided

## 2020-02-04 ENCOUNTER — Inpatient Hospital Stay: Payer: Medicare Other | Admitting: Infectious Diseases

## 2020-02-04 ENCOUNTER — Inpatient Hospital Stay: Payer: Medicare Other

## 2020-02-04 DIAGNOSIS — I4891 Unspecified atrial fibrillation: Secondary | ICD-10-CM

## 2020-02-04 DIAGNOSIS — I2510 Atherosclerotic heart disease of native coronary artery without angina pectoris: Secondary | ICD-10-CM

## 2020-02-04 DIAGNOSIS — E119 Type 2 diabetes mellitus without complications: Secondary | ICD-10-CM

## 2020-02-04 DIAGNOSIS — I5033 Acute on chronic diastolic (congestive) heart failure: Secondary | ICD-10-CM

## 2020-02-04 DIAGNOSIS — Z794 Long term (current) use of insulin: Secondary | ICD-10-CM

## 2020-02-04 LAB — BPAM RBC
Blood Product Expiration Date: 202110082359
Blood Product Expiration Date: 202110092359
ISSUE DATE / TIME: 202109130408
ISSUE DATE / TIME: 202109130727
Unit Type and Rh: 5100
Unit Type and Rh: 5100

## 2020-02-04 LAB — BASIC METABOLIC PANEL
Anion gap: 8 (ref 5–15)
BUN: 27 mg/dL — ABNORMAL HIGH (ref 8–23)
CO2: 36 mmol/L — ABNORMAL HIGH (ref 22–32)
Calcium: 8.6 mg/dL — ABNORMAL LOW (ref 8.9–10.3)
Chloride: 98 mmol/L (ref 98–111)
Creatinine, Ser: 1.56 mg/dL — ABNORMAL HIGH (ref 0.61–1.24)
GFR calc Af Amer: 49 mL/min — ABNORMAL LOW (ref >60)
GFR calc non Af Amer: 42 mL/min — ABNORMAL LOW (ref >60)
Glucose, Bld: 182 mg/dL — ABNORMAL HIGH (ref 70–99)
Potassium: 4 mmol/L (ref 3.5–5.1)
Sodium: 142 mmol/L (ref 135–145)

## 2020-02-04 LAB — CBC
HCT: 30.2 % — ABNORMAL LOW (ref 39.0–52.0)
Hemoglobin: 9.5 g/dL — ABNORMAL LOW (ref 13.0–17.0)
MCH: 28.5 pg (ref 26.0–34.0)
MCHC: 31.5 g/dL (ref 30.0–36.0)
MCV: 90.7 fL (ref 80.0–100.0)
Platelets: 238 10*3/uL (ref 150–400)
RBC: 3.33 MIL/uL — ABNORMAL LOW (ref 4.22–5.81)
RDW: 17.1 % — ABNORMAL HIGH (ref 11.5–15.5)
WBC: 10 10*3/uL (ref 4.0–10.5)
nRBC: 0 % (ref 0.0–0.2)

## 2020-02-04 LAB — TYPE AND SCREEN
ABO/RH(D): B POS
Antibody Screen: NEGATIVE
Unit division: 0
Unit division: 0

## 2020-02-04 LAB — GLUCOSE, CAPILLARY
Glucose-Capillary: 175 mg/dL — ABNORMAL HIGH (ref 70–99)
Glucose-Capillary: 227 mg/dL — ABNORMAL HIGH (ref 70–99)
Glucose-Capillary: 184 mg/dL — ABNORMAL HIGH (ref 70–99)
Glucose-Capillary: 144 mg/dL — ABNORMAL HIGH (ref 70–99)

## 2020-02-04 NOTE — Consult Note (Signed)
CARDIOLOGY CONSULT NOTE               Patient ID: Randall HaysGeorge Vincent MRN: [id number] DOB/AGE: 78/09/1940 78 y.o.  Admit date: 02/02/2020 Referring Physician Marland McalpineSheikh, MD Primary Physician Terance HartBronstein, MD Primary Cardiologist Beverely Paceheek, MD Reason for Consultation acute on chronic diastolic CHF  HPI: 79 year old with a history of coronary artery disease, status post CABG x2, type 2 diabetes, morbid obesity, hypertension, atrial fibrillation, status post cardioversion 10/22/2019, not currently anticoagulated due to history of GI bleed, on amiodarone, and CKD stage III. The patient was in his usual state of health until he developed left septic knee arthritis, was seen by orthopedics on 01/07/2020, at which time routine labs were drawn and the patient was noted to be anemic with a recent episode of melena. [facility] was [facility] to [facility] for acute blood loss anemia, received blood transfusions; endoscopy was unremarkable, but colonoscopy was inconclusive due to poor colon prep. He underwent total knee revision on 01/16/2020, and has been on IV antibiotics since then. His hemoglobin and hematocrit stabilized, and anticoagulant was discontinued per patient request, though colonoscopy was recommended as soon as possible. The patient was admitted 9/6-9/03/2020 for acute on chronic diastolic CHF with hypoxemia. 2D echocardiogram 01/11/2020 revealed normal left ventricular function with LVEF 50-55%. The patient diuresed well with IV Lasix, with weight down about 30 pounds. He was discharged on supplemental oxygen and oral Lasix 40 mg daily. The patient then began feeling short of breath over 24 hours with 7 pound weight gain,  increased work of breathing, and orthopnea, without chest pain. He returned to the hospital on 02/02/2020. Admission labs notable for hemoglobin 7.1 (hemoglobin was 8.4 eight days prior), BNP 1387, troponin 52, creatinine 1.56, BUN 27.   Chest xray revealed small bilateral pleural effusion and possible left lung  pneumonia. ECG revealed sinus rhythm at a rate of 60 bpm with old inferior infarct, similar to previous ECGs. The patient received 2 units of PRBCs, and IV Lasix. Repeat labs today show hemoglobin 9.5. At this time, the patient reports feeling better today than he has in 2 weeks. He still has increased work of breathing. He denies chest pain.  Review of systems complete and found to be negative unless listed above     Past Medical History:  Diagnosis Date  . A-fib (HCC)   . Diabetes (HCC)   . Hyperlipidemia   . Hypertension     Past Surgical History:  Procedure Laterality Date  . BLADDER SURGERY    . BYPASS GRAFT    . CARDIOVERSION    . COLON SURGERY    . COLONOSCOPY N/A 01/15/2020   Procedure: COLONOSCOPY;  Surgeon: Pasty Spillersahiliani, Varnita B, MD;  Location[facility] ENDOSCOPY;  Service: Endoscopy;  Laterality: N/A;  . COLONOSCOPY N/A 01/16/2020   Procedure: COLONOSCOPY;  Surgeon: Pasty Spillersahiliani, Varnita B, MD;  Location: [facility] ENDOSCOPY;  Service: Endoscopy;  Laterality: N/A;  . ESOPHAGOGASTRODUODENOSCOPY N/A 01/13/2020   Procedure: ESOPHAGOGASTRODUODENOSCOPY (EGD);  Surgeon: Pasty Spillersahiliani, Varnita B, MD;  [facility] END[facility]COPY;  Service: Endoscopy;  Laterality: N/A;  . REPLACEMENT TOTAL KNEE BILATERAL    . STENT PLACEMENT VASCULAR ([facility] HX)    . TOTAL KNEE REVISION Left 01/16/2020   Procedure: TOTAL KNEE REVISION;  Surgeon: Lyndle HerrlichBowers, James R, MD;  Location: [facility] ORS;  Service: Orthopedics;  Laterality: Left;    Medications Prior to Admission  Medication Sig Dispense Refill Last Dose  . acetaminophen (TYLENOL) 500 MG tablet Take 500-1,000 mg by mouth every 6 (six) hours as needed  for mild pain or fever.   prn at prn  . albuterol (VENTOLIN HFA) 108 (90 Base) MCG/ACT inhaler Inhale 2 puffs into the lungs every 6 (six) hours as needed for wheezing.   prn at prn  . amiodarone (PACERONE) 200 MG tablet Take 200 mg by mouth daily.   Unknown at Unknown  . ampicillin IVPB Inject 2 g into the vein every 6 (six)  hours. Indication:  E. Faecalis PJI of knee Last Day of Therapy:  02/22/2020 Labs - Once weekly:  CBC/D, CMP, CRP 88 Units 0 Unknown at Unknown  . aspirin EC 81 MG tablet Take 1 tablet (81 mg total) by mouth daily. Swallow whole.   Unknown at Unknown  . atorvastatin (LIPITOR) 40 MG tablet Take 40 mg by mouth every evening.    Unknown at Unknown  . carvedilol (COREG) 6.25 MG tablet Take 1 tablet (6.25 mg total) by mouth 2 (two) times daily with a meal.   Unknown at Unknown  . diltiazem (CARDIZEM CD) 180 MG 24 hr capsule Take 1 capsule (180 mg total) by mouth daily.   Unknown at Unknown  . fluticasone (FLONASE) 50 MCG/ACT nasal spray Place 2 sprays into both nostrils daily as needed for allergies or rhinitis.   prn at prn  . furosemide (LASIX) 40 MG tablet Take 1 tablet (40 mg total) by mouth daily. 30 tablet 1 Unknown at Unknown  . guaiFENesin (MUCINEX) 600 MG 12 hr tablet Take 1 tablet (600 mg total) by mouth 2 (two) times daily as needed for to loosen phlegm. 10 tablet 0 Unknown at Unknown  . ipratropium-albuterol (DUONEB) 0.5-2.5 (3) MG/3ML SOLN Take 3 mLs by nebulization every 6 (six) hours as needed. 360 mL 0 prn at prn  . mupirocin ointment (BACTROBAN) 2 % Apply 1 application topically daily as needed (leg sores).    prn at prn  . ramipril (ALTACE) 10 MG capsule Take 1 capsule (10 mg total) by mouth daily. 30 capsule 1 Unknown at Unknown  . Semaglutide, 1 MG/DOSE, 2 MG/1.5ML SOPN Inject 1 mg into the skin every Thursday.   Unknown at Unknown  . spironolactone (ALDACTONE) 25 MG tablet Take 25 mg by mouth daily.   Unknown at Unknown  . traMADol (ULTRAM) 50 MG tablet Take 1 tablet (50 mg total) by mouth every 8 (eight) hours as needed for moderate pain or severe pain. 10 tablet 0 prn at prn  . vitamin B-12 (CYANOCOBALAMIN) 1000 MCG tablet Take 1,000 mcg by mouth daily.   Unknown at Unknown   Social History   Socioeconomic History  . Marital status: Widowed    Spouse name: Not on file  .  Number of children: Not on file  . Years of education: Not on file  . Highest education level: Not on file  Occupational History  . Not on file  Tobacco Use  . Smoking status: Former Smoker    Quit date: 1991    Years since quitting: 30.7  . Smokeless tobacco: Never Used  Substance and Sexual Activity  . Alcohol use: Not on file  . Drug use: Not on file  . Sexual activity: Not on file  Other Topics Concern  . Not on file  Social History Narrative  . Not on file   Social Determinants of Health   Financial Resource Strain:   . Difficulty of Paying Living Expenses: Not on file  Food Insecurity:   . Worried About Programme researcher, broadcasting/film/video in the Last Year: Not on  file  . Ran Out of Food in the Last Year: Not on file  Transportation Needs:   . Lack of Transportation (Medical): Not on file  . Lack of Transportation (Non-Medical): Not on file  Physical Activity:   . Days of Exercise per Week: Not on file  . Minutes of Exercise per Session: Not on file  Stress:   . Feeling of Stress : Not on file  Social Connections:   . Frequency of Communication with Friends and Family: Not on file  . Frequency of Social Gatherings with Friends and Family: Not on file  . Attends Religious Services: Not on file  . Active Member of Clubs or Organizations: Not on file  . Attends Banker Meetings: Not on file  . Marital Status: Not on file  Intimate Partner Violence:   . Fear of Current or Ex-Partner: Not on file  . Emotionally Abused: Not on file  . Physically Abused: Not on file  . Sexually Abused: Not on file    History reviewed. No pertinent family history.    Review of systems complete and found to be negative unless listed above      PHYSICAL EXAM  General: elderly gentleman lying in bed in mild respiratory distress HEENT:  Normocephalic and atramatic Neck:  No JVD.  Lungs: increased effort of breathing on supplemental O2 via [place], diminished breath sounds throughout, some  wheezing Heart: HRRR . Normal S1 and S2 without gallops or murmurs.  Abdomen: soft, nontender, nondistended Msk:  Back normal, gait not assessed. Left knee brace. Extremities: venous stasis skin changes, bilateral pedal edema.   Neuro: Alert and oriented X 3. Psych:  Good affect, responds appropriately  Labs:   Lab Results  Component Value Date   WBC 10.0 02/04/2020   HGB 9.5 (L) 02/04/2020   HCT 30.2 (L) 02/04/2020   MCV 90.7 02/04/2020   PLT 238 02/04/2020    Recent Labs  Lab 02/02/20 1918 02/03/20 0353 02/04/20 0535  NA 141  --  142  K 4.0  --  4.0  CL 98  --  98  CO2 35*  --  36*  BUN 32*  --  27*  CREATININE 1.57*   < > 1.56*  CALCIUM 8.5*  --  8.6*  PROT 5.5*  --   --   BILITOT 0.7  --   --   ALKPHOS 96  --   --   ALT 22  --   --   AST 18  --   --   GLUCOSE 230*  --  182*   < > = values in this interval not displayed.   No results found for: CKTOTAL, CKMB, CKMBINDEX, TROPONINI No results found for: CHOL No results found for: HDL No results found for: LDLCALC No results found for: TRIG No results found for: CHOLHDL No results found for: LDLDIRECT    Radiology: DG Chest 1 View  Result Date: 02/04/2020 CLINICAL DATA:  Shortness of breath EXAM: CHEST  1 VIEW COMPARISON:  02/02/2020 FINDINGS: Cardiomegaly. Prior CABG. Diffuse interstitial and alveolar opacities throughout the lungs, similar to prior study. More consolidated appearance in the left lower lobe, similar to prior study. Some improvement in aeration at the right lung base. Small bilateral effusions. No acute bony abnormality. IMPRESSION: Interstitial and airspace opacities diffusely likely reflect edema/CHF. More confluent opacity at the left lung base could reflect pneumonia. Small effusions. Electronically Signed   By: Charlett Nose M.D.   On: 02/04/2020 10:41  DG Chest 2 View  Result Date: 01/10/2020 CLINICAL DATA:  Weakness EXAM: CHEST - 2 VIEW COMPARISON:  06/26/2019 FINDINGS: Hazy interstitial  opacities with a mid to lower lung predominance with some slight vascular cephalization/congestion. No pneumothorax. No effusion. No focal consolidation. Cardiomegaly appearance is similar to priors with evidence of prior sternotomy and CABG and a calcified, tortuous aorta. Cardiomediastinal contours are otherwise unremarkable. No acute osseous or soft tissue abnormality. Degenerative changes are present in the imaged spine and shoulders. IMPRESSION: Features of CHF/volume overload with cardiomegaly and findings suggesting mild interstitial pulmonary edema. Electronically Signed   By: Kreg Shropshire M.D.   On: 01/10/2020 19:19   DG Knee 1-2 Views Left  Result Date: 01/11/2020 CLINICAL DATA:  Bilateral knee replacements.  Pain and swelling. EXAM: LEFT KNEE - 1-2 VIEW COMPARISON:  None. FINDINGS: Left total knee arthroplasty without perihardware lucency. Intermediate sized suprapatellar joint effusion. No evidence of arthropathy or other focal bone abnormality. Soft tissues are unremarkable. IMPRESSION: 1. Intermediate sized suprapatellar joint effusion. 2. Uncomplicated left total knee arthroplasty. Electronically Signed   By: Deatra Robinson M.D.   On: 01/11/2020 04:34   DG Knee 1-2 Views Right  Result Date: 01/11/2020 CLINICAL DATA:  Knee replacements.  Swelling and pain. EXAM: RIGHT KNEE - 1-2 VIEW COMPARISON:  None. FINDINGS: There is a right total knee arthroplasty. No perihardware lucency. No evidence of arthropathy or other focal bone abnormality. Soft tissues are unremarkable. 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 Electronically Signed   By: Deatra Robinson M.D.   On: 01/11/2020 04:35   US RENAL  Result Date: 01/27/2020 CLINICAL DATA:  Acute kidney injury.  Chronic kidney disease. EXAM: RENAL / URINARY TRACT ULTRASOUND COMPLETE COMPARISON:  Abdomen and pelvis CT dated 09/05/2016. FINDINGS: Right Kidney: Renal measurements: 10.8 x 6.1 x 5.9 cm = volume: 201 mL. Echogenicity  within normal limits. No mass or hydronephrosis visualized. Left Kidney: Renal measurements: 12.2 x 6.8 x 5.8 cm = volume: 228 mL. Echogenicity within normal limits. No mass or hydronephrosis visualized. Bladder: Not distended, with a Foley catheter in place. Other: None. IMPRESSION: Normal examination. Electronically Signed   By: Beckie Salts M.D.   On: 01/27/2020 17:15   DG Chest Port 1 View  Result Date: 02/02/2020 CLINICAL DATA:  Shortness of breath, cough and edema EXAM: PORTABLE CHEST 1 VIEW COMPARISON:  Radiograph 01/27/2020 FINDINGS: Right upper extremity central venous catheter tip terminates at the expected location of the right brachiocephalic-superior caval confluence. Unchanged in position from comparison. Telemetry leads and nasal cannula overlie the chest. Evidence of prior sternotomy and likely CABG with fracture of the second superior most sternal suture. Prominence of the cardiac silhouette likely reflecting some mild cardiomegaly though possibly accentuated by low volumes and portable technique. Increasing interstitial and patchy opacities, indistinct pulmonary vascularity as well as some septal thickening and bilateral pleural effusions. No pneumothorax. More consolidative opacity is present in the right lung base as well. No acute osseous or soft tissue abnormality. Degenerative changes are present in the imaged spine and shoulders. IMPRESSION: 1. Features compatible with CHF demonstrating worsening pulmonary edema and new small bilateral pleural effusions. 2. More consolidative opacity in the right lung base could reflect developing infection, alveolar edema, and/or atelectasis. 3. Right upper extremity central venous catheter tip terminates at the expected location of the right brachiocephalic-superior caval confluence. Electronically Signed   By: Kreg Shropshire M.D.   On: 02/02/2020 19:37   DG Chest Portable 1 View  Result Date:  01/27/2020 CLINICAL DATA:  Shortness of breath. History of  diabetes, atrial fibrillation and hypertension. EXAM: PORTABLE CHEST 1 VIEW COMPARISON:  Radiographs 01/10/2020 and 06/26/2019. FINDINGS: 0814 hours. Two views obtained. There is stable cardiac enlargement post median sternotomy and CABG. There is aortic atherosclerosis. There is new mild pulmonary edema and probable small bilateral pleural effusions. No confluent airspace opacity or pneumothorax. Apparent right arm PICC not visualized beyond the level of the right sternoclavicular joint, likely in the right brachiocephalic vein. No acute osseous findings. IMPRESSION: 1. New mild pulmonary edema and probable small bilateral pleural effusions consistent with congestive heart failure. 2. Apparent right arm PICC tip is likely in the right brachiocephalic vein. Electronically Signed   By: Carey Bullocks M.D.   On: 01/27/2020 08:24   ECHOCARDIOGRAM COMPLETE  Result Date: 01/11/2020    ECHOCARDIOGRAM REPORT   Patient Name:   Randall Vincent Date of Exam: 01/11/2020 Medical Rec #:  [id number]     Height:       70.0 in Accession #:    [id number]    Weight:       240.0 lb Date of Birth:  Aug 24, 1940      BSA:          2.255 m Patient Age:    79 years      BP:           134/48 mmHg Patient Gender: M             HR:           74 bpm. Exam Location:  [facility] Procedure: 2D Echo, Cardiac Doppler and Color Doppler Indications:     CHF- acute diastolic 428.31  History:         Patient has no prior history of Echocardiogram examinations.                  Arrythmias:Atrial Fibrillation; Risk Factors:Hypertension,                  Diabetes and Dyslipidemia.  Sonographer:     Cristela Blue RDCS (AE) Referring Phys:  [id number] Andris Baumann Diagnosing Phys: Lorine Bears MD  Sonographer Comments: No apical window. IMPRESSIONS  1. Left ventricular ejection fraction, by estimation, is 50 to 55%. The left ventricle has low normal function. Left ventricular endocardial border not optimally defined to evaluate regional wall motion. Left ventricular  diastolic function could not be evaluated.  2. Right ventricular systolic function is normal. The right ventricular size is normal. Tricuspid regurgitation signal is inadequate for assessing PA pressure.  3. Left atrial size was mildly dilated.  4. The mitral valve is normal in structure. No evidence of mitral valve regurgitation. No evidence of mitral stenosis.  5. The aortic valve is normal in structure. Aortic valve regurgitation is not visualized. Mild aortic valve sclerosis is present, with no evidence of aortic valve stenosis.  6. Mildly dilated pulmonary artery.  7. The inferior vena cava is normal in size with greater than 50% respiratory variability, suggesting right atrial pressure of 3 mmHg. FINDINGS  Left Ventricle: Left ventricular ejection fraction, by estimation, is 50 to 55%. The left ventricle has low normal function. Left ventricular endocardial border not optimally defined to evaluate regional wall motion. The left ventricular internal cavity  size was normal in size. There is borderline left ventricular hypertrophy. Left ventricular diastolic function could not be evaluated. Right Ventricle: The right ventricular size is normal. No increase in right ventricular wall thickness. Right ventricular  systolic function is normal. Tricuspid regurgitation signal is inadequate for assessing PA pressure. The tricuspid regurgitant velocity is 2.67 m/s, and with an assumed right atrial pressure of 5 mmHg, the estimated right ventricular systolic pressure is 33.5 mmHg. Left Atrium: Left atrial size was mildly dilated. Right Atrium: Right atrial size was normal in size. Pericardium: There is no evidence of pericardial effusion. Mitral Valve: The mitral valve is normal in structure. Normal mobility of the mitral valve leaflets. Mild mitral annular calcification. No evidence of mitral valve regurgitation. No evidence of mitral valve stenosis. Tricuspid Valve: The tricuspid valve is normal in structure. Tricuspid  valve regurgitation is trivial. No evidence of tricuspid stenosis. Aortic Valve: The aortic valve is normal in structure. Aortic valve regurgitation is not visualized. Mild aortic valve sclerosis is present, with no evidence of aortic valve stenosis. Pulmonic Valve: The pulmonic valve was normal in structure. Pulmonic valve regurgitation is trivial. No evidence of pulmonic stenosis. Aorta: The aortic root is normal in size and structure. Pulmonary Artery: The pulmonary artery is mildly dilated. Venous: The inferior vena cava is normal in size with greater than 50% respiratory variability, suggesting right atrial pressure of 3 mmHg. IAS/Shunts: No atrial level shunt detected by color flow Doppler.  LEFT VENTRICLE PLAX 2D LVIDd:         5.30 cm LVIDs:         3.95 cm LV PW:         0.68 cm LV IVS:        1.77 cm LVOT diam:     2.00 cm LVOT Area:     3.14 cm  LEFT ATRIUM         Index LA diam:    4.70 cm 2.08 cm/m                        PULMONIC VALVE AORTA                 PV Vmax:        0.73 m/s Ao Root diam: 3.20 cm PV Peak grad:   2.1 mmHg                       RVOT Peak grad: 7 mmHg  TRICUSPID VALVE TR Peak grad:   28.5 mmHg TR Vmax:        267.00 cm/s  SHUNTS Systemic Diam: 2.00 cm Lorine Bears MD Electronically signed by Lorine Bears MD Signature Date/Time: 01/11/2020/5:11:53 PM    Final    [place] EKG SITE RITE  Result Date: 01/20/2020 If Site Rite image not attached, placement could not be confirmed due to current cardiac rhythm.   EKG: sinus rhythm  ASSESSMENT AND PLAN:  1. Acute on chronic diastolic CHF, recently discharged 02/01/2020 for the same, with recurrent weight gain, shortness of breath, peripheral edema, and orthopnea. BNP 1387, troponin flat. Chest xray with pulmonary edema and small bilateral effusions. Receiving IV Lasix and spironolactone. Symptoms improving status post blood transfusions. Complicated by anemia and CKD stage III. Unlikely caused by ischemia as troponin is flat, ECG is  without evidence of ischemia, and patient denies chest pain. 2. Atrial fibrillation, status post cardioversion 10/2019, currently in sinus rhythm, on amiodarone for rhythm control, diltiazem and carvedilol for rate control, not anticoagulated due to anemia and recent history of GI bleed. 3. Anemia, hemoglobin dropped from 8.4 to 7.0 eight days ago, status post blood transfusions. 4. CKD stage III, creatinine  1.56, BUN 27, GFR 42. 5. Morbid obesity, BMI 42 6. 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 Coronary artery disease, status post CABG, without chest pain.  Recommendations: 1. Management of GI bleed per GI and hospitalist 2. Continue amiodarone 200 mg for rhythm control and carvedilol and diltiazem for rate control 3. Defer chronic anticoagulation in light of GI bleed, and patient request 4. Continue IV Lasix 40 mg BID 5. Continue daily weights, fluid restriction, monitoring I&Os and renal function    Signed: Leanora Ivanoff PA-C 02/04/2020, 1:13 PM  The patient was also evaluated by Dr. Darrold Junker who agrees with the above plan.

## 2020-02-04 NOTE — Progress Notes (Signed)
   Heart Failure Nurse Navigator Note  HFpEF (50 -55%)  Initial Visit with patient and son.  Patient presented on 02/02/2020 after he had been discharged to skill facility on 01/31/2020 with  continued antibiotic treatment for septic knee.  Shortly after his admission at the facility he noted increasing SOB ,feeling full in the abdomen, lower extremity edema and orthopnea.  CXR in the  ED revealed bilateral pulmonary edema. BNP 1,387.  Comorbidities:  Coronary artery disease Atrial fibrillation Diabetes CKD stage III Obesity Hypertension Anemia   Medications:  Amiodarone 200 mg daily Lipitor 40 mg daily Cardizem CD 180 mg daily Spironolactone 25 mg daily Coreg 6.25 mg BID Lasix 40 mg IV BID  (On discharge he was placed on Ramipril 10 mg daily)   Labs:  Sodium 142,potassium 4.0,Bun 27, WBC 10,Hemoglobin 9.5 (on admission 7.0) BNP-1,387.  Weight:  128 kg (his discharge weight was 124.8 kg)   Intake- 910 ml Output-2500 ml   Had lengthy discussion with patient and son.  He had only been back to the care facility 30 hours. Both state that he had been compliant with low sodium diet and fluid restriction.  He denied any chest pain or pressure.  He did note worsening SOB,orthopnea, abdominal fullness and lower extremity edema.  Weight on discharge on 09/10 was 124.8 kg, last evening weight is documented at 128 kg.   He was seen today by Dr. Marland Mcalpine, have asked that he get  cardiology involved.  The patient remains on 5 liters 02 by nasal cannula, states that he is feeling somewhat improved but is not to baseline.  He has a foley in place.  Discussed with son and patient that they monitor his intake and make sure his nurse know what he has been drinking.  Instructed to please contact me if they have any further questions.  Will continue to follow through this hospitalization.  Tresa Endo RN, CHFN

## 2020-02-04 NOTE — Progress Notes (Signed)
PROGRESS NOTE    Randall Vincent  WFU:[id number] DOB: [birth year] DOA: 02/02/2020 PCP: Elspeth Cho., MD   Brief Narrative:  HPI per Dr. Earlie Lou on 02/02/20 Randall Vincent is a 79 y.o. 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  He had left septic arthritis and was on IV antibiotics followed by orthopedics during 2 previous hospitalizations.  Patient complained of worsening shortness of breath and swelling.  No fever or chills no nausea vomiting or diarrhea.  At discharge patient was transitioned from IV Lasix to oral Lasix 40 mg daily.  He had an echo done in August showing EF of 50 to 55%.  His weight was getting better.  Given incentive spirometer and to follow-up with his cardiologist over at [facility].  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  He had surgery in August where prosthesis was removed and antibiotic spacer was placed.  He was to stay off anticoagulation again except for aspirin due to his recent GI bleed.  Patient is now back with increasing his weight worsening shortness of breath and found to have bilateral pulmonary edema.  Is being readmitted to the hospital for possible treatment.  Additionally patient was found to have possible infiltrates bilaterally.  Repeat COVID-19 so far is negative.  Is being admitted for further treatment..  ED Course: Temperature is 98 blood pressure 150/52 pulse 50 respirate of 31 oxygen sat 91% on room air.  White count 8.6 hemoglobin 7.1 platelets 251 sodium 142 potassium 4.2 chloride 90 CO2 35 BUN 30 creatinine 1.57 calcium 8.5.  Glucose is 230.  BNP of 1387.   Chest x-ray showed bilateral pleural effusion and pulmonary edema.  Patient be readmitted for further treatment  **Interim History Cardiology was consulted given his recurrent CHF exacerbation recommending continue IV Lasix 40 g p.o. twice daily and chronic amiodarone control on carvedilol as well as diltiazem for rate control of his atrial fibrillation.  Currently not anticoagulated due to his recent anemia and history of GI bleed.  He was given 2 more units this admission  Assessment & Plan:   Principal Problem:   Acute exacerbation of CHF (congestive heart failure) (HCC) Active Problems:   CAD, multiple vessel   Atrial fibrillation status post cardioversion 10/22/19 [facility])   Essential hypertension   DM (diabetes mellitus), type 2 (HCC)   Symptomatic anemia   Suspect Septic arthritis (HCC)   Chronic kidney disease, stage 3a   A-fib (HCC)   CHF exacerbation (HCC)  Acute Respiratory Failure with Hypoxia in the setting of Acute on Chronic Diastolic CHF Acute exacerbation of congestive heart failure -Patient has diastolic dysfunction.    -BNP on Admission was 1,387.0 -He has been started on IV diuretics with IV Lasix 40 mg.  -Fair urine output.  Continues to have evidence of volume overload.   -Respiratory status not back to baseline since he still has orthopnea.   -Continue on IV diuretics. -Strict I's and O's and daily weights -Cardiology was consulted for further evaluation recommendations and appreciate their management and evaluation -Recent echocardiogram done -CXR showed "Interstitial and airspace opacities diffusely likely reflect edema/CHF. More confluent opacity at the left lung  base could reflect pneumonia. Small effusions.." -SpO2: 98 % O2 Flow Rate (L/min): 6 L/min -Continuous pulse oximetry maintain O2 saturation greater than 90% -Continue supplemental oxygen via nasal cannula and wean as tolerated -Will need an ambulatory home O2 screen prior to  discharge  Symptomatic Anemia Anemia of Chronic Kidney Disease -Hemoglobin down to 7.0 on admission.   -He has not noticed any significant bleeding in his stools or other sites.  He was transfused 2 units of PRBC since he was short of breath and had decompensated CHF.   -Follow-up hemoglobin 9.4 yesterday and today is 9.5.   -Continue to monitor closely.  Stool guaiac has been ordered since he has a history of GI bleed in the past.  Paroxysmal Atrial Fibrillation -Currently in sinus rhythm.   -atient underwent ablation procedure in June and has been off of anticoagulation.  -Continue rate control appropriately.  Continue on amiodarone, carvedilol and diltiazem -Cardiology consulted for further evaluation recommendations and appreciate their management  CAD -This is stable at baseline.  -No complaints of chest pain at this time.  -Continue treatment with carvedilol 6.25 mg p.o. twice daily, and atorvastatin 40 g p.o. daily  Diabetes Mellitus Type 2 -Continue with moderate NovoLog sliding scale insulin AC and at bedtime  Septic arthritis of the Left Leg -Continue with his home ampicillin.   -He will continue on IV antibiotics through 10/2.   -Plans to follow-up with orthopedics.  AKI on Chronic kidney disease stage IIIa -Continue monitoring in the setting of diuresis -BUN/Creatinine is now 27/1.56-continue monitor and trend renal function and avoid nephrotoxic medications, contrast dyes, hypotension and renally dose medications Repeat CMP in a.m.  Essential Hypertension -C/w Carvedilol 6.25 mg po BID and Diltiazem 180 mg po Daily -C/w Furosemide 40 mg IV BID -Continue blood pressure monitoring per Protocol  -Last BP was 148/64  Leukocytosis -Patient's WBC has gone from 8.6 -> 10.8 -> 11.7 -> 10.0 -Continue to Monitor and Trend -Repeat CBC in the AM   Morbid Obesity -Estimated body mass index is 42.91 kg/m as calculated from the following:   Height as of this  encounter: 5\' 8"  (1.727 m).   Weight as of this encounter: 128 kg. -Weight Loss and Dietary Counseling   DVT prophylaxis: SCDs Code Status: FULL COE Family Communication: Discussed with Son at Bedside  Disposition Plan: Pending further diuresis back to dry weight and evaluation by PT and OT and cardiac clearance  Status is: Inpatient  Remains inpatient appropriate because:Unsafe d/c plan, IV treatments appropriate due to intensity of illness or inability to take PO and Inpatient level of care appropriate due to severity of illness   Dispo: The patient is from: Home              Anticipated d/c is to: Home              Anticipated d/c date is: 2 days              Patient currently is not medically stable to d/c.  Consultants:   Cardiology   Procedures: None  Antimicrobials:  Anti-infectives (From admission, onward)   Start     Dose/Rate Route Frequency Ordered Stop   02/03/20 1200  ampicillin (OMNIPEN) 2 g in sodium chloride 0.9 % 100 mL IVPB        2 g 300 mL/hr over 20 Minutes Intravenous Every 6 hours 02/03/20 1008          Subjective: Seen and examined at bedside and  thinks he is breathing was a little bit better but still not back to baseline.  Denies any chest pain, lightheadedness or dizziness.  No nausea or vomiting.  No other concerns or complaints at this time.  Objective: Vitals:   02/03/20 1754 02/03/20 1930 02/04/20 0505 02/04/20 0750  BP: (!) 147/55 (!) 137/58 (!) 170/50 (!) 168/71  Pulse: 65 (!) 58 60 65  Resp: (!) Temp:  97.6 F (36.4 C) 98 F (36.7 C) 97.6 F (36.4 C)  TempSrc:  Oral Oral Oral  SpO2: 98% 99% 99% 94%  Weight:  128 kg    Height:   (1.727 m)      Intake/Output Summary (Last 24 hours) at 02/04/2020 0843 Last data filed at 02/04/2020 0439 Gross per 24 hour  Intake 540 ml  Output 2500 ml  Net -1960 ml   Filed Weights   02/03/20 1930  Weight: 128 kg   Examination: Physical Exam:  Constitutional: WN/WD  morbidly obese Caucasian male currently in NAD and appears calm and comfortable Eyes: Lids and conjunctivae normal, sclerae anicteric  ENMT: External Ears, Nose appear normal. Grossly normal hearing.  Neck: Appears normal, supple, no cervical masses, normal ROM, no appreciable thyromegaly: Mild JVD Respiratory: Diminished to auscultation bilaterally with coarse breath sounds, no wheezing, rales, rhonchi or crackles. Normal respiratory effort and patient is not tachypenic. No accessory muscle use.  Unlabored breathing but is wearing supplemental oxygen via nasal Cardiovascular: RRR, no murmurs / rubs / gallops.  1+ lower extremity edema Abdomen: Soft, non-tender, distended secondary by habitus.  Bowel sounds positive.  GU: Deferred. Musculoskeletal: No clubbing / cyanosis of digits/nails.  Left leg is immobilized from his septic arthritis Skin: No rashes, lesions, ulcers on limited skin evaluation. No induration; Warm and dry.  Neurologic: CN 2-12 grossly intact with no focal deficits. Romberg sign cerebellar reflexes not assessed.  Psychiatric: Normal judgment and insight. Alert and oriented x 3. Normal mood and appropriate affect.   Data Reviewed: I have personally reviewed following labs and imaging studies  CBC: Recent Labs  Lab 02/02/20 1918 02/03/20 0353 02/03/20 1405 02/04/20 0535  WBC 8.6 10.8* 11.7* 10.0  HGB 7.0* 7.5* 9.4* 9.5*  HCT 23.2* 23.9* 29.5* 30.2*  MCV 87.5 86.0 87.8 90.7  PLT 251 269 270 238   Basic Metabolic Panel: Recent Labs  Lab 01/29/20 0508 01/29/20 0508 01/30/20 0503 01/31/20 0644 02/02/20 1918 02/03/20 0353 02/04/20 0535  NA 140  --  141 141 141  --  142  K 4.5  --  4.4 4.5 4.0  --  4.0  CL 102  --  101 100 98  --  98  CO2 29  --  32 33* 35*  --  36*  GLUCOSE 163*  --  169* 168* 230*  --  182*  BUN 42*  --  40* 35* 32*  --  27*  CREATININE 2.26*   < > 1.87* 1.73* 1.57* 1.59* 1.56*  CALCIUM 8.4*  --  8.4* 8.2* 8.5*  --  8.6*   < > = values in  this interval not displayed.   GFR: Estimated Creatinine Clearance: 50.9 mL/min (A) (by C-G formula based on SCr of 1.56 mg/dL (H)). Liver Function Tests: Recent Labs  Lab 02/02/20 1918  AST 18  ALT 22  ALKPHOS 96  BILITOT 0.7  PROT 5.5*  ALBUMIN 2.4*   No results for input(s): LIPASE, AMYLASE in the last 168 hours. No results for input(s): AMMONIA  in the last 168 hours. Coagulation Profile: No results for input(s): INR, PROTIME in the last 168 hours. Cardiac Enzymes: No results for input(s): CKTOTAL, CKMB, CKMBINDEX, TROPONINI in the last 168 hours. BNP (last 3 results) No results for input(s): PROBNP in the last 8760 hours. HbA1C: No results for input(s): HGBA1C in the last 72 hours. CBG: Recent Labs  Lab 01/31/20 2114 02/01/20 0818 02/01/20 1117 02/03/20 2147 02/04/20 0749  GLUCAP 144* 159* 177* 183* 175*   Lipid Profile: No results for input(s): CHOL, HDL, LDLCALC, TRIG, CHOLHDL, LDLDIRECT in the last 72 hours. Thyroid Function Tests: No results for input(s): TSH, T4TOTAL, FREET4, T3FREE, THYROIDAB in the last 72 hours. Anemia Panel: No results for input(s): VITAMINB12, FOLATE, FERRITIN, TIBC, IRON, RETICCTPCT in the last 72 hours. Sepsis Labs: No results for input(s): PROCALCITON, LATICACIDVEN in the last 168 hours.  Recent Results (from the past 240 hour(s))  SARS Coronavirus 2 by RT PCR (hospital order, performed in [facility] hospital lab) Nasopharyngeal Nasopharyngeal Swab     Status: None   Collection Time: 01/27/20  8:11 AM   Specimen: Nasopharyngeal Swab  Result Value Ref Range Status   SARS Coronavirus 2 NEGATIVE NEGATIVE Final    Comment: (NOTE) SARS-CoV-2 target nucleic acids are NOT DETECTED.  The SARS-CoV-2 RNA is generally detectable in upper and lower respiratory specimens during the acute phase of infection. The lowest concentration of SARS-CoV-2 viral copies this assay can detect is 250 copies / mL. A negative result does not preclude  SARS-CoV-2 infection and should not be used as the sole basis for treatment or other patient management decisions.  A negative result may occur with improper specimen collection / handling, submission of specimen other than nasopharyngeal swab, presence of viral mutation(s) within the areas targeted by this assay, and inadequate number of viral copies (<250 copies / mL). A negative result must be combined with clinical observations, patient history, and epidemiological information.  Fact Sheet for Patients:   BoilerBrush.com.cy  Fact Sheet for Healthcare Providers: https://pope.com/  This test is not yet approved or  cleared by the [place] FDA and has been authorized for detection and/or diagnosis of SARS-CoV-2 by FDA under an Emergency Use Authorization (EUA).  This EUA will remain in effect (meaning this test can be used) for the duration of the COVID-19 declaration under Section 564(b)(1) of the Act, 21 U.S.C. section 360bbb-3(b)(1), unless the authorization is terminated or revoked sooner.  Performed at [facility], [address] [place], [place] [address]   MRSA PCR Screening     Status: None   Collection Time: 01/29/20  8:17 PM   Specimen: Nasal Mucosa; Nasopharyngeal  Result Value Ref Range Status   MRSA by PCR NEGATIVE NEGATIVE Final    Comment:        The GeneXpert MRSA Assay (FDA approved for NASAL specimens only), is one component of a comprehensive MRSA colonization surveillance program. It is not intended to diagnose MRSA infection nor to guide or monitor treatment for MRSA infections. Performed at [facility], [address] [place], [place] [address]   SARS Coronavirus 2 by RT PCR (hospital order, performed in [facility] hospital lab) Nasopharyngeal Nasopharyngeal Swab     Status: None   Collection Time: 01/31/20 10:34 AM   Specimen: Nasopharyngeal Swab  Result Value Ref Range  Status   SARS Coronavirus 2 NEGATIVE NEGATIVE Final    Comment: (NOTE) SARS-CoV-2 target nucleic acids are NOT DETECTED.  The SARS-CoV-2 RNA is generally detectable  in upper and lower respiratory specimens during the acute phase of infection. The lowest concentration of SARS-CoV-2 viral copies this assay can detect is 250 copies / mL. A negative result does not preclude SARS-CoV-2 infection and should not be used as the sole basis for treatment or other patient management decisions.  A negative result may occur with improper specimen collection / handling, submission of specimen other than nasopharyngeal swab, presence of viral mutation(s) within the areas targeted by this assay, and inadequate number of viral copies (<250 copies / mL). A negative result must be combined with clinical observations, patient history, and epidemiological information.  Fact Sheet for Patients:   BoilerBrush.com.cy  Fact Sheet for Healthcare Providers: https://pope.com/  This test is not yet approved or  cleared by the [place] FDA and has been authorized for detection and/or diagnosis of SARS-CoV-2 by FDA under an Emergency Use Authorization (EUA).  This EUA will remain in effect (meaning this test can be used) for the duration of the COVID-19 declaration under Section 564(b)(1) of the Act, 21 U.S.C. section 360bbb-3(b)(1), unless the authorization is terminated or revoked sooner.  Performed at [facility], [address] [place], [place] [address]   SARS Coronavirus 2 by RT PCR (hospital order, performed in [facility] hospital lab) Nasopharyngeal Nasopharyngeal Swab     Status: None   Collection Time: 02/02/20  7:18 PM   Specimen: Nasopharyngeal Swab  Result Value Ref Range Status   SARS Coronavirus 2 NEGATIVE NEGATIVE Final    Comment: (NOTE) SARS-CoV-2 target nucleic acids are NOT DETECTED.  The SARS-CoV-2 RNA is generally  detectable in upper and lower respiratory specimens during the acute phase of infection. The lowest concentration of SARS-CoV-2 viral copies this assay can detect is 250 copies / mL. A negative result does not preclude SARS-CoV-2 infection and should not be used as the sole basis for treatment or other patient management decisions.  A negative result may occur with improper specimen collection / handling, submission of specimen other than nasopharyngeal swab, presence of viral mutation(s) within the areas targeted by this assay, and inadequate number of viral copies (<250 copies / mL). A negative result must be combined with clinical observations, patient history, and epidemiological information.  Fact Sheet for Patients:   BoilerBrush.com.cy  Fact Sheet for Healthcare Providers: https://pope.com/  This test is not yet approved or  cleared by the [place] FDA and has been authorized for detection and/or diagnosis of SARS-CoV-2 by FDA under an Emergency Use Authorization (EUA).  This EUA will remain in effect (meaning this test can be used) for the duration of the COVID-19 declaration under Section 564(b)(1) of the Act, 21 U.S.C. section 360bbb-3(b)(1), unless the authorization is terminated or revoked sooner.  Performed at [facility], [address] [place], [place] [address]      RN Pressure Injury Documentation: Pressure Injury 01/29/20 Heel Left Stage 1 -  Intact skin with non-blanchable redness of a localized area usually over a bony prominence. due to knee mobilizer (Active)  01/29/20 0930  Location: Heel  Location Orientation: Left  Staging: Stage 1 -  Intact skin with non-blanchable redness of a localized area usually over a bony prominence.  Wound Description (Comments): due to knee mobilizer  Present on Admission: Yes     Estimated body mass index is 42.91 kg/m as calculated from the following:    Height as of this encounter:  (1.727 m).   Weight as of this encounter: 128 kg.  Malnutrition Type:      Malnutrition Characteristics:      Nutrition Interventions:    Radiology Studies: DG Chest Port 1 View  Result Date: 02/02/2020 CLINICAL DATA:  Shortness of breath, cough and edema EXAM: PORTABLE CHEST 1 VIEW COMPARISON:  Radiograph 01/27/2020 FINDINGS: Right upper extremity central venous catheter tip terminates at the expected location of the right brachiocephalic-superior caval confluence. Unchanged in position from comparison. Telemetry leads and nasal cannula overlie the chest. Evidence of prior sternotomy and likely CABG with fracture of the second superior most sternal suture. Prominence of the cardiac silhouette likely reflecting some mild cardiomegaly though possibly accentuated by low volumes and portable technique. Increasing interstitial and patchy opacities, indistinct pulmonary vascularity as well as some septal thickening and bilateral pleural effusions. No pneumothorax. More consolidative opacity is present in the right lung base as well. No acute osseous or soft tissue abnormality. Degenerative changes are present in the imaged spine and shoulders. IMPRESSION: 1. Features compatible with CHF demonstrating worsening pulmonary edema and new small bilateral pleural effusions. 2. More consolidative opacity in the right lung base could reflect developing infection, alveolar edema, and/or atelectasis. 3. Right upper extremity central venous catheter tip terminates at the expected location of the right brachiocephalic-superior caval confluence. Electronically Signed   By: Kreg ShropshirePrice  DeHay M.D.   On: 02/02/2020 19:37   Scheduled Meds: . amiodarone  200 mg Oral Daily  . atorvastatin  40 mg Oral QPM  . carvedilol  6.25 mg Oral BID WC  . diltiazem  180 mg Oral Daily  . furosemide  40 mg Intravenous BID  . insulin aspart  0-15 Units Subcutaneous TID WC  . insulin aspart  0-5  Units Subcutaneous QHS  . sodium chloride flush  3 mL Intravenous Q12H  . spironolactone  25 mg Oral Daily   Continuous Infusions: . sodium chloride    . sodium chloride    . ampicillin (OMNIPEN) IV 2 g (02/04/20 0436)    LOS: 1 day   Merlene Laughtermair Latif Erhardt Dada, DO [facility] PAGER is on AMION  If 7PM-7AM, please contact night-coverage [email or web address]

## 2020-02-05 ENCOUNTER — Inpatient Hospital Stay: Payer: Medicare Other

## 2020-02-05 LAB — CBC WITH DIFFERENTIAL/PLATELET
Abs Immature Granulocytes: 0.17 10*3/uL — ABNORMAL HIGH (ref 0.00–0.07)
Basophils Absolute: 0 10*3/uL (ref 0.0–0.1)
Basophils Relative: 1 %
Eosinophils Absolute: 0.2 10*3/uL (ref 0.0–0.5)
Eosinophils Relative: 2 %
HCT: 29.5 % — ABNORMAL LOW (ref 39.0–52.0)
Hemoglobin: 9.2 g/dL — ABNORMAL LOW (ref 13.0–17.0)
Immature Granulocytes: 2 %
Lymphocytes Relative: 7 %
Lymphs Abs: 0.6 10*3/uL — ABNORMAL LOW (ref 0.7–4.0)
MCH: 28.2 pg (ref 26.0–34.0)
MCHC: 31.2 g/dL (ref 30.0–36.0)
MCV: 90.5 fL (ref 80.0–100.0)
Monocytes Absolute: 0.7 10*3/uL (ref 0.1–1.0)
Monocytes Relative: 9 %
Neutro Abs: 6.8 10*3/uL (ref 1.7–7.7)
Neutrophils Relative %: 79 %
Platelets: 204 10*3/uL (ref 150–400)
RBC: 3.26 MIL/uL — ABNORMAL LOW (ref 4.22–5.81)
RDW: 17.2 % — ABNORMAL HIGH (ref 11.5–15.5)
WBC: 8.5 10*3/uL (ref 4.0–10.5)
nRBC: 0 % (ref 0.0–0.2)

## 2020-02-05 LAB — COMPREHENSIVE METABOLIC PANEL
ALT: 27 U/L (ref 0–44)
AST: 22 U/L (ref 15–41)
Albumin: 2.5 g/dL — ABNORMAL LOW (ref 3.5–5.0)
Alkaline Phosphatase: 101 U/L (ref 38–126)
Anion gap: 9 (ref 5–15)
BUN: 28 mg/dL — ABNORMAL HIGH (ref 8–23)
CO2: 36 mmol/L — ABNORMAL HIGH (ref 22–32)
Calcium: 8.2 mg/dL — ABNORMAL LOW (ref 8.9–10.3)
Chloride: 97 mmol/L — ABNORMAL LOW (ref 98–111)
Creatinine, Ser: 1.36 mg/dL — ABNORMAL HIGH (ref 0.61–1.24)
GFR calc Af Amer: 57 mL/min — ABNORMAL LOW (ref >60)
GFR calc non Af Amer: 49 mL/min — ABNORMAL LOW (ref >60)
Glucose, Bld: 190 mg/dL — ABNORMAL HIGH (ref 70–99)
Potassium: 3.8 mmol/L (ref 3.5–5.1)
Sodium: 142 mmol/L (ref 135–145)
Total Bilirubin: 0.8 mg/dL (ref 0.3–1.2)
Total Protein: 5.5 g/dL — ABNORMAL LOW (ref 6.5–8.1)

## 2020-02-05 LAB — PHOSPHORUS: Phosphorus: 3.3 mg/dL (ref 2.5–4.6)

## 2020-02-05 LAB — GLUCOSE, CAPILLARY
Glucose-Capillary: 183 mg/dL — ABNORMAL HIGH (ref 70–99)
Glucose-Capillary: 275 mg/dL — ABNORMAL HIGH (ref 70–99)
Glucose-Capillary: 194 mg/dL — ABNORMAL HIGH (ref 70–99)

## 2020-02-05 LAB — OCCULT BLOOD X 1 CARD TO LAB, STOOL: Fecal Occult Bld: NEGATIVE

## 2020-02-05 LAB — MAGNESIUM: Magnesium: 1.9 mg/dL (ref 1.7–2.4)

## 2020-02-05 MED ORDER — SODIUM CHLORIDE 0.9 % IV SOLN
2.0000 g | INTRAVENOUS | Status: DC
  Administered 2020-02-05 – 2020-02-09 (×23): 2 g via INTRAVENOUS
  Filled 2020-02-05 (×2): qty 2000
  Filled 2020-02-05 (×2): qty 2
  Filled 2020-02-05 (×5): qty 2000
  Filled 2020-02-05 (×2): qty 2
  Filled 2020-02-05 (×3): qty 2000
  Filled 2020-02-05: qty 2
  Filled 2020-02-05 (×7): qty 2000
  Filled 2020-02-05: qty 2
  Filled 2020-02-05: qty 2000
  Filled 2020-02-05: qty 2
  Filled 2020-02-05 (×2): qty 2000
  Filled 2020-02-05: qty 2
  Filled 2020-02-05 (×2): qty 2000
  Filled 2020-02-05 (×2): qty 2

## 2020-02-05 MED ORDER — CARVEDILOL 3.125 MG PO TABS
3.1250 mg | ORAL_TABLET | Freq: Two times a day (BID) | ORAL | Status: DC
  Administered 2020-02-06 – 2020-02-09 (×6): 3.125 mg via ORAL
  Filled 2020-02-05 (×7): qty 1

## 2020-02-05 MED ORDER — FUROSEMIDE 10 MG/ML IJ SOLN
80.0000 mg | Freq: Two times a day (BID) | INTRAMUSCULAR | Status: DC
  Administered 2020-02-05 – 2020-02-06 (×2): 80 mg via INTRAVENOUS
  Filled 2020-02-05 (×2): qty 8

## 2020-02-05 NOTE — Progress Notes (Signed)
Heart Failure Nurse Navigator Note  HFpEF 50-55%  Visit today with patient and son.  He presented on 02/02/2020 after he had been discharged to a skill facility on 01/31/2020 for continued IV antibiotic  therapy for septic knee.  Shortly after admission there he complained of increasing SOB, feeling full in his abdomen, lower extremity edema and orthopnea.  CXR in the ED revealed bilateral pulmonary edema. BNP 1,387.  Comorbidities:  Coronary artery disease Atrial fibrillation Diabetes CKD stage III Obesity Hypertension Anemia  Medications:  Amiodarone 200 mg daily Lipitor 40 mg daily Cardizem CD 180 daily Spironolactone 25 mg daily Coreg 6.25 mg BID Lasix 80 mg IV  BID   On previous hospitalization he was started on Ramipril 10 mg daily.  LABS:  Sodium 142, potassium 3.8,BUN 28, creatinine 1.36(1.56yesterday),Magnesium 1.9, Hemoglobin 9.2.  Weight  122.7 kg( yesterdays 128 kg)  Intake 1160 ml Output 2150 ml  Discussion today with son and patient about the medications(GDMT) he is currently on and why the ramipril was added on the last admission.  They voice understanding.  Patient states today his breathing has improved but is not back to baseline.   He also states he does not feel as bloated in his abdomen and less swelling in his legs.  He has a good appetite and is wanting the foley removed.  He also had a bowel movement today after getting up to commode, do not know the results of guaiac.  Pharmacy is recommending SGLT-2 inhibitor  Be started at time of discharge.  Son is also wanting to take his father back to his home rather than going to care facility.  Care manager aware.   Will continue to follow.  Tresa Endo RN, CHFN

## 2020-02-05 NOTE — Evaluation (Signed)
Occupational Therapy Evaluation Patient Details Name: Randall Vincent MRN: [id number] DOB: 07-Oct-1940 Today's Date: 02/05/2020    History of Present Illness Randall Vincent is a 79 y.o. male with history of atrial fibrillation, diabetes, hypertension, CHF who presents with complaints of increased short of breath and fluid buildup.  Patient was recently discharged from the hospital, review of records demonstrates discharged yesterday after significant diuresis.  Patient reports that he was doing okay but started feeling increased shortness of breath over the last 24 hours with increased work of breathing.  Denies fevers chills or cough.  Is concerned that he needs further diuresis.  Also recently treated for septic arthritis of the left knee.   Clinical Impression   Randall Vincent was seen for OT evaluation this date. Prior to hospital admission, pt was receiving care in a skilled nursing facility. He plans to DC home to live with his son where he will have family support 24/7. Pt adamantly declines return to STR. Currently pt demonstrates impairments as described below (See OT problem list) which functionally limit his ability to perform ADL/self-care tasks. Pt currently requires MIN A for STS attempts from a low surface, as well as supervision for safety during functional mobility with cueing for adherence to WB precautions. He requires MOD A for LB ADL management due to limited LB access 2/2 knee immobilizer and limited AROM of his L knee.  Pt would benefit from skilled OT services to address noted impairments and functional limitations (see below for any additional details) in order to maximize safety and independence while minimizing falls risk and caregiver burden. Upon hospital discharge, recommend HHOT to maximize pt safety and return to functional independence during meaningful occupations of daily life.      Follow Up Recommendations  Home health OT;Supervision/Assistance - 24 hour    Equipment  Recommendations  Hospital bed    Recommendations for Other Services       Precautions / Restrictions Precautions Precautions: Fall Precaution Comments: High Fall Risk; R PICC Required Braces or Orthoses: Knee Immobilizer - Left Knee Immobilizer - Left: On at all times Restrictions Weight Bearing Restrictions: Yes LLE Weight Bearing: Touchdown weight bearing LLE Partial Weight Bearing Percentage or Pounds: Per notes from past admission, 20% TDWB OK for transfers.      Mobility Bed Mobility Overal bed mobility: Needs Assistance Bed Mobility: Sit to Supine     Supine to sit: HOB elevated;Supervision Sit to supine: Mod assist   General bed mobility comments: MOD A to bring BLE over EOB during sit>sup.  Transfers Overall transfer level: Needs assistance Equipment used: Rolling walker (2 wheeled) Transfers: Sit to/from Stand Sit to Stand: Min assist         General transfer comment: Min A for lift off from low surface.    Balance Overall balance assessment: Needs assistance Sitting-balance support: No upper extremity supported;Feet supported Sitting balance-Leahy Scale: Good Sitting balance - Comments: steady sitting reaching within BOS   Standing balance support: During functional activity;Bilateral upper extremity supported Standing balance-Leahy Scale: Fair Standing balance comment: No LOB and reported NWB, but 20% allowed on LLE. Heavy reliance on RW for support.                           ADL either performed or assessed with clinical judgement   ADL Overall ADL's : Needs assistance/impaired Eating/Feeding: Set up;Sitting   Grooming: Set up;Sitting   Upper Body Bathing: Sitting;Set up   Lower Body Bathing:  Moderate assistance;Sitting/lateral leans   Upper Body Dressing : Sitting;Set up   Lower Body Dressing: Sit to/from stand;Minimal assistance   Toilet Transfer: RW;BSC;Minimal assistance   Toileting- Clothing Manipulation and Hygiene:  Moderate assistance;Sit to/from stand       Functional mobility during ADLs: Minimal assistance;Rolling walker General ADL Comments: MIN-MOD A for LB ADL in seated position. MIN A for STS from low surface such as recliner. Supervision with cueing for safety/WB during functional mobility using a RW.     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     Perception     Praxis      Pertinent Vitals/Pain Pain Assessment: No/denies pain     Hand Dominance     Extremity/Trunk Assessment Upper Extremity Assessment Upper Extremity Assessment: Generalized weakness   Lower Extremity Assessment Lower Extremity Assessment: Generalized weakness;LLE deficits/detail;Defer to PT evaluation LLE Deficits / Details: L LE in knee immobilizer LLE: Unable to fully assess due to immobilization   Cervical / Trunk Assessment Cervical / Trunk Assessment: Normal   Communication Communication Communication: No difficulties   Cognition Arousal/Alertness: Awake/alert Behavior During Therapy: WFL for tasks assessed/performed Overall Cognitive Status: Within Functional Limits for tasks assessed                                 General Comments: Pt is A and O x 4   General Comments  Pt on 6L  t/o session. spO2 remains WFL >94% t/o session.    Exercises Total Joint Exercises Ankle Circles/Pumps: AROM;Strengthening;Both;10 reps;Supine Marching in Standing: AROM;Strengthening;Both;10 reps;Seated Other Exercises Other Exercises: Pt educated on role of PT in acute setting and services provided during hospital stay. Other Exercises: Pt educated on importance of keeping weight bearing restrictions. Other Exercises: Pt/caregiver (son at bedside) educated on role of OT in acute, vs. rehab, vs HH setting, falls prevention strategies, safe  use of AE/DME for ADL management, and routines modifications to support safety and functional indep upon hospital DC.   Shoulder Instructions      Home Living Family/patient expects to be discharged to:: Private residence Living Arrangements: Children Available Help at Discharge: Family;Available 24 hours/day Type of Home: House Home Access: Stairs to enter (Son voices plans to build a ramp) Secretary/administrator of Steps: 4   Home Layout: One level     Bathroom Shower/Tub: Producer, television/film/video: Handicapped height Bathroom Accessibility: Yes How Accessible: Accessible via wheelchair Home Equipment: Environmental consultant - 2 wheels;Cane - single point;Adaptive equipment Adaptive Equipment: Reacher        Prior Functioning/Environment Level of Independence: Independent with assistive device(s)        Comments: Prior to inital admission ~1 month ago, pt was using a SPC or RW for functional mobility, living alone, independent for ADL management. Pt plans to live with son upon DC from this hospitalization.        OT Problem List: Decreased strength;Decreased coordination;Pain;Decreased range of motion;Decreased activity tolerance;Decreased safety awareness;Impaired balance (sitting and/or standing);Decreased knowledge of use of DME or AE      OT Treatment/Interventions: Self-care/ADL training;Therapeutic exercise;Therapeutic activities;DME and/or AE instruction;Patient/family education;Balance training;Energy conservation    OT Goals(Current goals can be found in the care plan section) Acute Rehab OT Goals Patient Stated Goal: To go home with son. OT Goal Formulation: With  patient Time For Goal Achievement: 02/19/20 Potential to Achieve Goals: Good ADL Goals Pt Will Perform Grooming: sitting;with modified independence Pt Will Perform Lower Body Dressing: with set-up;with supervision;sit to/from stand (LRAD PRN for improved safety and functional independence.) Pt Will Transfer  to Toilet: bedside commode;ambulating;with modified independence (LRAD PRN for improved safety and functional independence.) Pt Will Perform Toileting - Clothing Manipulation and hygiene: sit to/from stand;with set-up;with supervision;with adaptive equipment (LRAD PRN for improved safety and functional independence.)  OT Frequency: Min 2X/week   Barriers to D/C:            Co-evaluation              AM-PAC OT "6 Clicks" Daily Activity     Outcome Measure Help from another person eating meals?: A Little Help from another person taking care of personal grooming?: A Little Help from another person toileting, which includes using toliet, bedpan, or urinal?: A Lot Help from another person bathing (including washing, rinsing, drying)?: A Lot Help from another person to put on and taking off regular upper body clothing?: A Little Help from another person to put on and taking off regular lower body clothing?: A Lot 6 Click Score: 15   End of Session Equipment Utilized During Treatment: Gait belt;Rolling walker Nurse Communication: Mobility status  Activity Tolerance: Patient tolerated treatment well Patient left: in bed;with call bell/phone within reach;with bed alarm set;with family/visitor present  OT Visit Diagnosis: Other abnormalities of gait and mobility (R26.89);Muscle weakness (generalized) (M62.81);Pain Pain - Right/Left: Left Pain - part of body: Knee                Time: 1357-1430 OT Time Calculation (min): 33 min Charges:  OT General Charges $OT Visit: 1 Visit OT Evaluation $OT Eval Moderate Complexity: 1 Mod OT Treatments $Self Care/Home Management : 8-22 mins  Rockney Ghee, M.S., OTR/L Ascom: [phone] 02/05/20, 4:24 PM

## 2020-02-05 NOTE — Progress Notes (Signed)
HR consistently in 50's. Cardiology notified. Coreg dose to be adjusted in half. Verbal order to hold evening dose.

## 2020-02-05 NOTE — Progress Notes (Signed)
[facility] Cardiology    SUBJECTIVE: The patient reports feeling about the same as yesterday. He still has shortness of breath while talking and when adjusting his position in bed. He denies chest pain. He thinks he lower extremity edema is improved.   Vitals:   02/04/20 1748 02/04/20 2014 02/05/20 0547 02/05/20 0746  BP:  (!) 125/56 (!) 147/55 (!) 161/62  Pulse:  (!) 53 (!) 57 (!) 57  Resp:  20 20 17   Temp:  97.9 F (36.6 C) 97.7 F (36.5 C) 97.8 F (36.6 C)  TempSrc:  Oral Oral Oral  SpO2: 96% 100% 99% 94%  Weight:   122.7 kg   Height:         Intake/Output Summary (Last 24 hours) at 02/05/2020 02/07/2020 Last data filed at 02/05/2020 0551 Gross per 24 hour  Intake 1060 ml  Output 1350 ml  Net -290 ml      PHYSICAL EXAM  General: elderly gentleman lying in bed at an incline in no acute distress HEENT:  Normocephalic and atramatic Neck:   No JVD.  Lungs: increased effort of breathing on supplemental O2 via [place] when talking and adjusting his position in bed, diminished breath sounds throughout especially bases, some wheezing Heart: HRRR . Normal S1 and S2 without gallops or murmurs.  Abdomen: soft, nontender, nondistended Msk:  Back normal, gait not assessed. Left knee brace. Extremities: venous stasis skin changes, trace right pedal edema, mild left pedal edema.   Neuro: Alert and oriented X 3. Psych:  Good affect, responds appropriately   LABS: Basic Metabolic Panel: Recent Labs    02/04/20 0535 02/05/20 0435  NA 142 142  K 4.0 3.8  CL 98 97*  CO2 36* 36*  GLUCOSE 182* 190*  BUN 27* 28*  CREATININE 1.56* 1.36*  CALCIUM 8.6* 8.2*  MG  --  1.9  PHOS  --  3.3   Liver Function Tests: Recent Labs    02/02/20 1918 02/05/20 0435  AST 18 22  ALT 22 27  ALKPHOS 96 101  BILITOT 0.7 0.8  PROT 5.5* 5.5*  ALBUMIN 2.4* 2.5*   No results for input(s): LIPASE, AMYLASE in the last 72 hours. CBC: Recent Labs    02/04/20 0535 02/05/20 0435  WBC 10.0 8.5  NEUTROABS  --   6.8  HGB 9.5* 9.2*  HCT 30.2* 29.5*  MCV 90.7 90.5  PLT 238 204   Cardiac Enzymes: No results for input(s): CKTOTAL, CKMB, CKMBINDEX, TROPONINI in the last 72 hours. BNP: Invalid input(s): POCBNP D-Dimer: No results for input(s): DDIMER in the last 72 hours. Hemoglobin A1C: No results for input(s): HGBA1C in the last 72 hours. Fasting Lipid Panel: No results for input(s): CHOL, HDL, LDLCALC, TRIG, CHOLHDL, LDLDIRECT in the last 72 hours. Thyroid Function Tests: No results for input(s): TSH, T4TOTAL, T3FREE, THYROIDAB in the last 72 hours.  Invalid input(s): FREET3 Anemia Panel: No results for input(s): VITAMINB12, FOLATE, FERRITIN, TIBC, IRON, RETICCTPCT in the last 72 hours.  DG Chest 1 View  Result Date: 02/05/2020 CLINICAL DATA:  Shortness of breath EXAM: CHEST  1 VIEW COMPARISON:  02/04/2020, 02/02/2020, 01/27/2020 FINDINGS: Post sternotomy changes. Right upper extremity central venous catheter tip projects over the brachiocephalic region. Cardiomegaly with vascular congestion and diffuse bilateral interstitial and ground-glass opacity most likely reflecting pulmonary edema. Bilateral effusions and basilar consolidation without change. Aortic atherosclerosis. No pneumothorax. IMPRESSION: No significant interval change in appearance of the chest with cardiomegaly, vascular congestion, pulmonary edema and bilateral effusions with basilar consolidation.  Electronically Signed   By: Jasmine Pang M.D.   On: 02/05/2020 02:12   DG Chest 1 View  Result Date: 02/04/2020 CLINICAL DATA:  Shortness of breath EXAM: CHEST  1 VIEW COMPARISON:  02/02/2020 FINDINGS: Cardiomegaly. Prior CABG. Diffuse interstitial and alveolar opacities throughout the lungs, similar to prior study. More consolidated appearance in the left lower lobe, similar to prior study. Some improvement in aeration at the right lung base. Small bilateral effusions. No acute bony abnormality. IMPRESSION: Interstitial and airspace  opacities diffusely likely reflect edema/CHF. More confluent opacity at the left lung base could reflect pneumonia. Small effusions. Electronically Signed   By: Charlett Nose M.D.   On: 02/04/2020 10:41     Echo LVEF 50-55%   TELEMETRY: sinus rhythm, 60s  ASSESSMENT AND PLAN:  Principal Problem:   Acute exacerbation of CHF (congestive heart failure) (HCC) Active Problems:   CAD, multiple vessel   Atrial fibrillation status post cardioversion 10/22/19 [facility])   Essential hypertension   DM (diabetes mellitus), type 2 (HCC)   Symptomatic anemia   Suspect Septic arthritis (HCC)   Chronic kidney disease, stage 3a   A-fib (HCC)   CHF exacerbation (HCC)    1. Acute on chronic diastolic CHF, recently discharged 02/01/2020 for the same, with recurrent weight gain, shortness of breath, peripheral edema, and orthopnea. BNP 1387, troponin flat. Chest xray with pulmonary edema and small bilateral effusions, with no interval changes on chest xray today. Receiving IV Lasix and spironolactone. Symptoms improving status post blood transfusions. Complicated by anemia and CKD stage III. Unlikely caused by ischemia as troponin is flat, ECG is without evidence of ischemia, and patient denies chest pain. 2. Atrial fibrillation, status post cardioversion 10/2019, currently in sinus rhythm, on amiodarone for rhythm control, diltiazem and carvedilol for rate control, not anticoagulated due to anemia and recent history of GI bleed. 3. Anemia, hemoglobin dropped from 8.4 to 7.0 eight days ago, status post blood transfusions. Hemoglobin 9.2 today 4. CKD stage III, creatinine 1.36, BUN 28, GFR 49. 5. Morbid obesity, BMI 41 6. 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 Coronary artery disease, status post CABG, without chest pain.  Recommendations: 1. Management of anemia per hospitalist 2. Continue amiodarone 200 mg for rhythm control and carvedilol and  diltiazem for rate control 3. Defer chronic anticoagulation in light of GI bleed, and patient request 4. Recommend increasing IV Lasix to 80 mg BID 5. Continue daily weights, fluid restriction, monitoring I&Os and renal function    Leanora Ivanoff, PA-C 02/05/2020 8:29 AM

## 2020-02-05 NOTE — Progress Notes (Signed)
PHARMACY NOTE:  ANTIMICROBIAL RENAL DOSAGE ADJUSTMENT  Current antimicrobial regimen includes a mismatch between antimicrobial dosage and estimated renal function.  As per policy approved by the Pharmacy & Therapeutics and Medical Executive Committees, the antimicrobial dosage will be adjusted accordingly.  Current antimicrobial dosage:  Ampicillin 2g IV q6hrs  Indication: osteomyelitis  Renal Function:  Estimated Creatinine Clearance: 57 mL/min (A) (by C-G formula based on SCr of 1.36 mg/dL (H)).    Antimicrobial dosage has been changed to:  Ampicillin 2g IV q4hrs  Additional comments: Scr 1.59>1.56>1.36   Thank you for allowing pharmacy to be a part of this patient's care.  Raiford Noble, PharmD Pharmacy Resident  02/05/2020 10:04 AM

## 2020-02-05 NOTE — Evaluation (Signed)
Physical Therapy Evaluation Patient Details Name: Randall Vincent MRN: [id number] DOB: March 15, 1941 Today's Date: 02/05/2020   History of Present Illness  Abdulai Blaylock is a 79 y.o. male with history of atrial fibrillation, diabetes, hypertension, CHF who presents with complaints of increased short of breath and fluid buildup.  Patient was recently discharged from the hospital, review of records demonstrates discharged yesterday after significant diuresis.  Patient reports that he was doing okay but started feeling increased shortness of breath over the last 24 hours with increased work of breathing.  Denies fevers chills or cough.  Is concerned that he needs further diuresis.  Also recently treated for septic arthritis of the left knee.      Clinical Impression  Pt was in rehab prior to hospital admission and reports not having adequate therapy while in facility.  Pt currently requires the need for encouragement in order to come to standing position from seated EOB.  Pt attempted with use of FWW the first attempt and was unsuccessful with use of supervision.  When pt received CGA with therapist under arm, pt was able to come into standing without much difficulty.  Pt then able to transfer to seated in recliner with minimal weight bearing through the LLE.  Pt reports he uses for balance when in standing, but is not placing more than 20% of weight through LE.  Pt left in recliner with all needs met and son present in room.  Pt will benefit from PT services in a SNF setting upon discharge to safely address deficits listed in patient problem list for decreased caregiver assistance and eventual return to PLOF.  Due to pt and son refusing STR placement, HHPT would be appropriate at this time to assist in navigation of home.        Follow Up Recommendations SNF;Other (comment);Home health PT;Supervision for mobility/OOB (Discussed d/c options with pt and son and both refused to be sent to SNF and would like  him to come home with son.)    Equipment Recommendations   (TBD at next facility)    Recommendations for Other Services OT consult     Precautions / Restrictions Precautions Precautions: Fall Precaution Comments: High Fall Risk; R PICC Required Braces or Orthoses: Knee Immobilizer - Left Knee Immobilizer - Left: On at all times Restrictions Weight Bearing Restrictions: Yes LLE Weight Bearing: Touchdown weight bearing LLE Partial Weight Bearing Percentage or Pounds: Noted 20% is ok for touchdown weight bearing      Mobility  Bed Mobility Overal bed mobility: Needs Assistance Bed Mobility: Supine to Sit     Supine to sit: HOB elevated;Supervision     General bed mobility comments: verbal cues for proper hand placement.  Transfers Overall transfer level: Needs assistance Equipment used: Rolling walker (2 wheeled) Transfers: Sit to/from Stand Sit to Stand: From elevated surface;Min guard         General transfer comment: pt unable to stand up to RW with bed height elevated, vc's for technique, and encouragement from therapist.  Ambulation/Gait Ambulation/Gait assistance: Min assist Gait Distance (Feet): 4 Feet Assistive device: Rolling walker (2 wheeled) Gait Pattern/deviations: Step-to pattern;Trunk flexed;Decreased stride length;Shuffle Gait velocity: decreased   General Gait Details: pt utilizes scooting method to transfer to chair.  Stairs            Wheelchair Mobility    Modified Rankin (Stroke Patients Only)       Balance Overall balance assessment: Needs assistance Sitting-balance support: No upper extremity supported;Feet supported Sitting balance-Leahy Scale:  Good       Standing balance-Leahy Scale: Fair Standing balance comment: No LOB and reported NWB, but 20% allowed on LLE. Heavy reliance on RW for support.                             Pertinent Vitals/Pain Pain Assessment: No/denies pain    Home Living  Family/patient expects to be discharged to:: Private residence Living Arrangements: Children Available Help at Discharge: Family;Available 24 hours/day Type of Home: House       Home Layout: One level Home Equipment: [place] - 2 wheels;Cane - single point;Adaptive equipment      Prior Function Level of Independence: Independent with assistive device(s)         Comments: Pt using SPC or RW for functional mobility prior to recent hospitalizations.     Hand Dominance        Extremity/Trunk Assessment   Upper Extremity Assessment Upper Extremity Assessment: Defer to OT evaluation    Lower Extremity Assessment Lower Extremity Assessment: Generalized weakness;LLE deficits/detail LLE Deficits / Details: L LE in knee immobilizer LLE: Unable to fully assess due to immobilization    Cervical / Trunk Assessment Cervical / Trunk Assessment: Normal  Communication   Communication: No difficulties  Cognition Arousal/Alertness: Awake/alert Behavior During Therapy: WFL for tasks assessed/performed Overall Cognitive Status: Within Functional Limits for tasks assessed                                 General Comments: Pt is A and O x 4      General Comments      Exercises Total Joint Exercises Ankle Circles/Pumps: AROM;Strengthening;Both;10 reps;Supine Marching in Standing: AROM;Strengthening;Both;10 reps;Seated Other Exercises Other Exercises: Pt educated on role of PT in acute setting and services provided during hospital stay. Other Exercises: Pt educated on importance of keeping weight bearing restrictions.   Assessment/Plan    PT Assessment Patient needs continued PT services  PT Problem List Decreased strength;Decreased activity tolerance;Decreased range of motion;Decreased balance;Decreased mobility;Decreased knowledge of use of DME;Cardiopulmonary status limiting activity;Pain;Decreased knowledge of precautions       PT Treatment Interventions DME  instruction;Gait training;Functional mobility training;Therapeutic activities;Therapeutic exercise;Balance training;Patient/family education    PT Goals (Current goals can be found in the Care Plan section)  Acute Rehab PT Goals Patient Stated Goal: To go home with son. PT Goal Formulation: With patient/family Time For Goal Achievement: 02/19/20 Potential to Achieve Goals: Fair    Frequency Min 2X/week   Barriers to discharge Decreased caregiver support;Inaccessible home environment      Co-evaluation               AM-PAC PT "6 Clicks" Mobility  Outcome Measure Help needed turning from your back to your side while in a flat bed without using bedrails?: A Little Help needed moving from lying on your back to sitting on the side of a flat bed without using bedrails?: A Little Help needed moving to and from a bed to a chair (including a wheelchair)?: A Little Help needed standing up from a chair using your arms (e.g., wheelchair or bedside chair)?: A Little Help needed to walk in hospital room?: A Lot Help needed climbing 3-5 steps with a railing? : A Lot 6 Click Score: 16    End of Session Equipment Utilized During Treatment: Gait belt Activity Tolerance: Patient tolerated treatment well;No increased pain Patient  left: with call bell/phone within reach;in chair;with chair alarm set;with family/visitor present (L LE elevated via pillow with heel floating) Nurse Communication: Mobility status;Precautions;Weight bearing status PT Visit Diagnosis: Other abnormalities of gait and mobility (R26.89);Muscle weakness (generalized) (M62.81);Difficulty in walking, not elsewhere classified (R26.2);Pain Pain - Right/Left: Left Pain - part of body: Knee    Time: 3474-2595 PT Time Calculation (min) (ACUTE ONLY): 35 min   Charges:   PT Evaluation $PT Eval Low Complexity: 1 Low PT Treatments $Gait Training: 8-22 mins $Therapeutic Exercise: 8-22 mins        Gwenlyn Saran, PT,  DPT 02/05/20, 1:58 PM

## 2020-02-05 NOTE — Progress Notes (Addendum)
PROGRESS NOTE    Randall Vincent  BMW:[id number] DOB: 1940/07/24 DOA: 02/02/2020 PCP: Elspeth Cho., MD   Brief Narrative:  HPI per Dr. Earlie Lou on 02/02/20 Randall Vincent is a 79 y.o. 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  He had left septic arthritis and was on IV antibiotics followed by orthopedics during 2 previous hospitalizations.  Patient complained of worsening shortness of breath and swelling.  No fever or chills no nausea vomiting or diarrhea.  At discharge patient was transitioned from IV Lasix to oral Lasix 40 mg daily.  He had an echo done in August showing EF of 50 to 55%.  His weight was getting better.  Given incentive spirometer and to follow-up with his cardiologist over at [facility].  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  He had surgery in August where prosthesis was removed and antibiotic spacer was placed.  He was to stay off anticoagulation again except for aspirin due to his recent GI bleed.  Patient is now back with increasing his weight worsening shortness of breath and found to have bilateral pulmonary edema.  Is being readmitted to the hospital for possible treatment.  Additionally patient was found to have possible infiltrates bilaterally.  Repeat COVID-19 so far is negative.  Is being admitted for further treatment..  ED Course: Temperature is 98 blood pressure 150/52 pulse 50 respirate of 31 oxygen sat 91% on room air.  White count 8.6 hemoglobin 7.1 platelets 251 sodium 142 potassium 4.2 chloride 90 CO2 35 BUN 30 creatinine 1.57 calcium 8.5.  Glucose is 230.  BNP of 1387.   Chest x-ray showed bilateral pleural effusion and pulmonary edema.  Patient be readmitted for further treatment  **Interim History Cardiology was consulted given his recurrent CHF exacerbation recommending continue IV Lasix and chronic amiodarone control on carvedilol as well as diltiazem for rate control of his atrial fibrillation.  Currently not anticoagulated due to his new recent anemia and concern for possible GI bleed.[name]  He was given 2 more units this admission. H/H stable 9/15.   Assessment & Plan:   Principal Problem:   Acute exacerbation of CHF (congestive heart failure) (HCC) Active Problems:   CAD, multiple vessel   Atrial fibrillation status post cardioversion 10/22/19 [facility])   Essential hypertension   DM (diabetes mellitus), type 2 (HCC)   Symptomatic anemia   Suspect Septic arthritis (HCC)   Chronic kidney disease, stage 3a   A-fib (HCC)   CHF exacerbation (HCC)  Acute Respiratory Failure with Hypoxia in the setting of Acute on Chronic Diastolic CHF Acute exacerbation of congestive heart failure -Patient has diastolic dysfunction.    -BNP on Admission was 1,387.0 -He has been started on IV diuretics and today cardiology increased to 80 IV bid -Fair urine output, 1350 yesterday.  Continues to have evidence of volume overload.   -Respiratory status not back to baseline but improving -Strict I's and O's and daily weights - discontinue foley today (inserted yesterday per patient preference) -CXR showed "Interstitial and airspace opacities diffusely likely reflect edema/CHF. More confluent opacity at the left lung base could reflect pneumonia. Small effusions.." -SpO2: 94 % O2 Flow  Rate (L/min): 5 L/min -Continuous pulse oximetry maintain O2 saturation greater than 90% -Continue supplemental oxygen via nasal cannula and wean as tolerated -Will need an ambulatory home O2 screen prior to discharge  Symptomatic Anemia Anemia of Chronic Kidney Disease -Hemoglobin down to  7.0 on admission.   -He has not noticed any significant bleeding in his stools or other sites.  He was transfused 2 units of PRBC since he was short of breath and had decompensated CHF.   -Follow-up hemoglobins stable in the 9s -Continue to monitor closely.  Stool guaiac ordered today, negative.  Paroxysmal Atrial Fibrillation -Currently in sinus rhythm.   -atient underwent ablation procedure in June and has been off of anticoagulation.  -Continue rate control appropriately.  Continue on amiodarone, carvedilol and diltiazem -Cardiology consulted for further evaluation recommendations and appreciate their management  CAD -This is stable at baseline.  -No complaints of chest pain at this time.  -Continue treatment with carvedilol 6.25 mg p.o. twice daily, and atorvastatin 40 g p.o. daily  Diabetes Mellitus Type 2  - controlled - Continue with moderate NovoLog sliding scale insulin AC and at bedtime  Septic arthritis of the Left Leg -Continue with his home ampicillin.   -He will continue on IV antibiotics through 10/2.   -Plans to follow-up with orthopedics.  AKI on Chronic kidney disease stage IIIa - kidney function now back to baseline with IV diuresis  Essential Hypertension -C/w Carvedilol 6.25 mg po BID and Diltiazem 180 mg po Daily -C/w Furosemide 40 mg IV BID -Continue blood pressure monitoring per Protocol  -Last BP was 148/64  Leukocytosis -resolved  Morbid Obesity -Estimated body mass index is 41.13 kg/m as calculated from the following:   Height as of this encounter:  (1.727 m).   Weight as of this encounter: 122.7 kg. -Weight Loss and Dietary Counseling   DVT prophylaxis: SCDs Code Status: FULL COE Family Communication: Discussed with Son at Bedside  Disposition Plan: Pending further diuresis back to dry weight and evaluation by PT and OT and cardiac clearance  Status is: Inpatient  Remains inpatient appropriate because:Unsafe d/c plan, IV  treatments appropriate due to intensity of illness or inability to take PO and Inpatient level of care appropriate due to severity of illness   Dispo: The patient is from: Home              Anticipated d/c is to: Home              Anticipated d/c date is: 2 days              Patient currently is not medically stable to d/c.  Consultants:   Cardiology   Procedures: None  Antimicrobials:  Anti-infectives (From admission, onward)   Start     Dose/Rate Route Frequency Ordered Stop   02/05/20 1200  ampicillin (OMNIPEN) 2 g in sodium chloride 0.9 % 100 mL IVPB        2 g 300 mL/hr over 20 Minutes Intravenous Every 4 hours 02/05/20 0956 02/22/20 2359   02/03/20 1200  ampicillin (OMNIPEN) 2 g in sodium chloride 0.9 % 100 mL IVPB  Status:  Discontinued        2 g 300 mL/hr over 20 Minutes Intravenous Every 6 hours 02/03/20 1008 02/05/20 0956        Subjective: Seen and examined at bedside and thinks he is breathing was a little bit better but still not back to baseline.  Denies any chest pain, lightheadedness or  dizziness.  No nausea or vomiting.  No other concerns or complaints at this time. Did have a BM this morning, no melena or blood. Denies hematuria. Denied to me bleeding from elsewhere but RN says she noted a small brief nose bleed this morning.  Objective: Vitals:   02/04/20 1748 02/04/20 2014 02/05/20 0547 02/05/20 0746  BP:  (!) 125/56 (!) 147/55 (!) 161/62  Pulse:  (!) 53 (!) 57 (!) 57  Resp:  20 20 17   Temp:  97.9 F (36.6 C) 97.7 F (36.5 C) 97.8 F (36.6 C)  TempSrc:  Oral Oral Oral  SpO2: 96% 100% 99% 94%  Weight:   122.7 kg   Height:        Intake/Output Summary (Last 24 hours) at 02/05/2020 1120 Last data filed at 02/05/2020 1008 Gross per 24 hour  Intake 940 ml  Output 1350 ml  Net -410 ml   Filed Weights   02/03/20 1930 02/05/20 0547  Weight: 128 kg 122.7 kg   Examination: Physical Exam:  Constitutional: WN/WD morbidly obese Caucasian male  currently in NAD and appears calm and comfortable Eyes: Lids and conjunctivae normal, sclerae anicteric  ENMT: External Ears, Nose appear normal. Grossly normal hearing.  Respiratory: Diminished to auscultation bilaterally with faint breath sounds, no wheezing, rales, rhonchi or crackles. Normal respiratory effort and patient is not tachypenic. No accessory muscle use.  Cardiovascular: RRR, no murmurs / rubs / gallops.  1+ lower extremity edema Abdomen: Soft, non-tender, distended secondary by habitus.  Bowel sounds positive.  GU: Deferred. Musculoskeletal: No clubbing / cyanosis of digits/nails.  Left leg is immobilized from his septic arthritis Skin: No rashes, lesions, ulcers on limited skin evaluation. No induration; Warm and dry.  Neurologic: symmetric movements  Psychiatric: Normal judgment and insight. Alert and oriented x 3. Normal mood and appropriate affect.   Data Reviewed: I have personally reviewed following labs and imaging studies  CBC: Recent Labs  Lab 02/02/20 1918 02/03/20 0353 02/03/20 1405 02/04/20 0535 02/05/20 0435  WBC 8.6 10.8* 11.7* 10.0 8.5  NEUTROABS  --   --   --   --  6.8  HGB 7.0* 7.5* 9.4* 9.5* 9.2*  HCT 23.2* 23.9* 29.5* 30.2* 29.5*  MCV 87.5 86.0 87.8 90.7 90.5  PLT 251 269 270 238 204   Basic Metabolic Panel: Recent Labs  Lab 01/30/20 0503 01/30/20 0503 01/31/20 0644 02/02/20 1918 02/03/20 0353 02/04/20 0535 02/05/20 0435  NA 141  --  141 141  --  142 142  K 4.4  --  4.5 4.0  --  4.0 3.8  CL 101  --  100 98  --  98 97*  CO2 32  --  33* 35*  --  36* 36*  GLUCOSE 169*  --  168* 230*  --  182* 190*  BUN 40*  --  35* 32*  --  27* 28*  CREATININE 1.87*   < > 1.73* 1.57* 1.59* 1.56* 1.36*  CALCIUM 8.4*  --  8.2* 8.5*  --  8.6* 8.2*  MG  --   --   --   --   --   --  1.9  PHOS  --   --   --   --   --   --  3.3   < > = values in this interval not displayed.   GFR: Estimated Creatinine Clearance: 57 mL/min (A) (by C-G formula based on SCr of  1.36 mg/dL (H)). Liver Function Tests: Recent Labs  Lab  02/02/20 1918 02/05/20 0435  AST 18 22  ALT 22 27  ALKPHOS 96 101  BILITOT 0.7 0.8  PROT 5.5* 5.5*  ALBUMIN 2.4* 2.5*   No results for input(s): LIPASE, AMYLASE in the last 168 hours. No results for input(s): AMMONIA in the last 168 hours. Coagulation Profile: No results for input(s): INR, PROTIME in the last 168 hours. Cardiac Enzymes: No results for input(s): CKTOTAL, CKMB, CKMBINDEX, TROPONINI in the last 168 hours. BNP (last 3 results) No results for input(s): PROBNP in the last 8760 hours. HbA1C: No results for input(s): HGBA1C in the last 72 hours. CBG: Recent Labs  Lab 02/04/20 0749 02/04/20 1156 02/04/20 1646 02/04/20 2058 02/05/20 0746  GLUCAP 175* 227* 184* 144* 183*   Lipid Profile: No results for input(s): CHOL, HDL, LDLCALC, TRIG, CHOLHDL, LDLDIRECT in the last 72 hours. Thyroid Function Tests: No results for input(s): TSH, T4TOTAL, FREET4, T3FREE, THYROIDAB in the last 72 hours. Anemia Panel: No results for input(s): VITAMINB12, FOLATE, FERRITIN, TIBC, IRON, RETICCTPCT in the last 72 hours. Sepsis Labs: No results for input(s): PROCALCITON, LATICACIDVEN in the last 168 hours.  Recent Results (from the past 240 hour(s))  SARS Coronavirus 2 by RT PCR (hospital order, performed in [facility] hospital lab) Nasopharyngeal Nasopharyngeal Swab     Status: None   Collection Time: 01/27/20  8:11 AM   Specimen: Nasopharyngeal Swab  Result Value Ref Range Status   SARS Coronavirus 2 NEGATIVE NEGATIVE Final    Comment: (NOTE) SARS-CoV-2 target nucleic acids are NOT DETECTED.  The SARS-CoV-2 RNA is generally detectable in upper and lower respiratory specimens during the acute phase of infection. The lowest concentration of SARS-CoV-2 viral copies this assay can detect is 250 copies / mL. A negative result does not preclude SARS-CoV-2 infection and should not be used as the sole basis for treatment or  other patient management decisions.  A negative result may occur with improper specimen collection / handling, submission of specimen other than nasopharyngeal swab, presence of viral mutation(s) within the areas targeted by this assay, and inadequate number of viral copies (<250 copies / mL). A negative result must be combined with clinical observations, patient history, and epidemiological information.  Fact Sheet for Patients:   BoilerBrush.com.cy  Fact Sheet for Healthcare Providers: https://pope.com/  This test is not yet approved or  cleared by the [place] FDA and has been authorized for detection and/or diagnosis of SARS-CoV-2 by FDA under an Emergency Use Authorization (EUA).  This EUA will remain in effect (meaning this test can be used) for the duration of the COVID-19 declaration under Section 564(b)(1) of the Act, 21 U.S.C. section 360bbb-3(b)(1), unless the authorization is terminated or revoked sooner.  Performed at [facility], [address] [place], [place] [address]   MRSA PCR Screening     Status: None   Collection Time: 01/29/20  8:17 PM   Specimen: Nasal Mucosa; Nasopharyngeal  Result Value Ref Range Status   MRSA by PCR NEGATIVE NEGATIVE Final    Comment:        The GeneXpert MRSA Assay (FDA approved for NASAL specimens only), is one component of a comprehensive MRSA colonization surveillance program. It is not intended to diagnose MRSA infection nor to guide or monitor treatment for MRSA infections. Performed at [facility], [address] [place], [place] [address]   SARS Coronavirus 2 by RT PCR (hospital order, performed in [facility] hospital lab) Nasopharyngeal Nasopharyngeal Swab     Status: None  Collection Time: 01/31/20 10:34 AM   Specimen: Nasopharyngeal Swab  Result Value Ref Range Status   SARS Coronavirus 2 NEGATIVE NEGATIVE Final    Comment:  (NOTE) SARS-CoV-2 target nucleic acids are NOT DETECTED.  The SARS-CoV-2 RNA is generally detectable in upper and lower respiratory specimens during the acute phase of infection. The lowest concentration of SARS-CoV-2 viral copies this assay can detect is 250 copies / mL. A negative result does not preclude SARS-CoV-2 infection and should not be used as the sole basis for treatment or other patient management decisions.  A negative result may occur with improper specimen collection / handling, submission of specimen other than nasopharyngeal swab, presence of viral mutation(s) within the areas targeted by this assay, and inadequate number of viral copies (<250 copies / mL). A negative result must be combined with clinical observations, patient history, and epidemiological information.  Fact Sheet for Patients:   BoilerBrush.com.cy  Fact Sheet for Healthcare Providers: https://pope.com/  This test is not yet approved or  cleared by the [place] FDA and has been authorized for detection and/or diagnosis of SARS-CoV-2 by FDA under an Emergency Use Authorization (EUA).  This EUA will remain in effect (meaning this test can be used) for the duration of the COVID-19 declaration under Section 564(b)(1) of the Act, 21 U.S.C. section 360bbb-3(b)(1), unless the authorization is terminated or revoked sooner.  Performed at [facility], [address] [place], [place] [address]   SARS Coronavirus 2 by RT PCR (hospital order, performed in [facility] hospital lab) Nasopharyngeal Nasopharyngeal Swab     Status: None   Collection Time: 02/02/20  7:18 PM   Specimen: Nasopharyngeal Swab  Result Value Ref Range Status   SARS Coronavirus 2 NEGATIVE NEGATIVE Final    Comment: (NOTE) SARS-CoV-2 target nucleic acids are NOT DETECTED.  The SARS-CoV-2 RNA is generally detectable in upper and lower respiratory specimens during the acute  phase of infection. The lowest concentration of SARS-CoV-2 viral copies this assay can detect is 250 copies / mL. A negative result does not preclude SARS-CoV-2 infection and should not be used as the sole basis for treatment or other patient management decisions.  A negative result may occur with improper specimen collection / handling, submission of specimen other than nasopharyngeal swab, presence of viral mutation(s) within the areas targeted by this assay, and inadequate number of viral copies (<250 copies / mL). A negative result must be combined with clinical observations, patient history, and epidemiological information.  Fact Sheet for Patients:   BoilerBrush.com.cy  Fact Sheet for Healthcare Providers: https://pope.com/  This test is not yet approved or  cleared by the [place] FDA and has been authorized for detection and/or diagnosis of SARS-CoV-2 by FDA under an Emergency Use Authorization (EUA).  This EUA will remain in effect (meaning this test can be used) for the duration of the COVID-19 declaration under Section 564(b)(1) of the Act, 21 U.S.C. section 360bbb-3(b)(1), unless the authorization is terminated or revoked sooner.  Performed at [facility], [address] [place], [place] [address]      RN Pressure Injury Documentation: Pressure Injury 01/29/20 Heel Left Stage 1 -  Intact skin with non-blanchable redness of a localized area usually over a bony prominence. due to knee mobilizer (Active)  01/29/20 0930  Location: Heel  Location Orientation: Left  Staging: Stage 1 -  Intact skin with non-blanchable redness of a localized area usually over a bony prominence.  Wound Description (Comments): due to knee  mobilizer  Present on Admission: Yes     Estimated body mass index is 41.13 kg/m as calculated from the following:   Height as of this encounter:  (1.727 m).   Weight as of this  encounter: 122.7 kg.  Malnutrition Type:      Malnutrition Characteristics:      Nutrition Interventions:    Radiology Studies: DG Chest 1 View  Result Date: 02/05/2020 CLINICAL DATA:  Shortness of breath EXAM: CHEST  1 VIEW COMPARISON:  02/04/2020, 02/02/2020, 01/27/2020 FINDINGS: Post sternotomy changes. Right upper extremity central venous catheter tip projects over the brachiocephalic region. Cardiomegaly with vascular congestion and diffuse bilateral interstitial and ground-glass opacity most likely reflecting pulmonary edema. Bilateral effusions and basilar consolidation without change. Aortic atherosclerosis. No pneumothorax. IMPRESSION: No significant interval change in appearance of the chest with cardiomegaly, vascular congestion, pulmonary edema and bilateral effusions with basilar consolidation. Electronically Signed   By: Jasmine Pang M.D.   On: 02/05/2020 02:12   DG Chest 1 View  Result Date: 02/04/2020 CLINICAL DATA:  Shortness of breath EXAM: CHEST  1 VIEW COMPARISON:  02/02/2020 FINDINGS: Cardiomegaly. Prior CABG. Diffuse interstitial and alveolar opacities throughout the lungs, similar to prior study. More consolidated appearance in the left lower lobe, similar to prior study. Some improvement in aeration at the right lung base. Small bilateral effusions. No acute bony abnormality. IMPRESSION: Interstitial and airspace opacities diffusely likely reflect edema/CHF. More confluent opacity at the left lung base could reflect pneumonia. Small effusions. Electronically Signed   By: Charlett Nose M.D.   On: 02/04/2020 10:41   Scheduled Meds: . amiodarone  200 mg Oral Daily  . atorvastatin  40 mg Oral QPM  . carvedilol  6.25 mg Oral BID WC  . diltiazem  180 mg Oral Daily  . furosemide  80 mg Intravenous BID  . insulin aspart  0-15 Units Subcutaneous TID WC  . insulin aspart  0-5 Units Subcutaneous QHS  . sodium chloride flush  3 mL Intravenous Q12H  . spironolactone  25 mg  Oral Daily   Continuous Infusions: . sodium chloride 250 mL (02/04/20 0900)  . sodium chloride    . ampicillin (OMNIPEN) IV      LOS: 2 days   Silvano Bilis, MD [facility] PAGER is on AMION  If 7PM-7AM, please contact night-coverage [email or web address]

## 2020-02-05 NOTE — Progress Notes (Signed)
Foley catheter removed per order. Tolerated well. Instructed patient to call for assistance when needing to void.

## 2020-02-06 DIAGNOSIS — T8454XA Infection and inflammatory reaction due to internal left knee prosthesis, initial encounter: Secondary | ICD-10-CM

## 2020-02-06 DIAGNOSIS — I509 Heart failure, unspecified: Secondary | ICD-10-CM

## 2020-02-06 DIAGNOSIS — B952 Enterococcus as the cause of diseases classified elsewhere: Secondary | ICD-10-CM

## 2020-02-06 DIAGNOSIS — N189 Chronic kidney disease, unspecified: Secondary | ICD-10-CM

## 2020-02-06 LAB — BASIC METABOLIC PANEL
Anion gap: 11 (ref 5–15)
BUN: 25 mg/dL — ABNORMAL HIGH (ref 8–23)
CO2: 34 mmol/L — ABNORMAL HIGH (ref 22–32)
Calcium: 8.2 mg/dL — ABNORMAL LOW (ref 8.9–10.3)
Chloride: 96 mmol/L — ABNORMAL LOW (ref 98–111)
Creatinine, Ser: 1.46 mg/dL — ABNORMAL HIGH (ref 0.61–1.24)
GFR calc Af Amer: 53 mL/min — ABNORMAL LOW (ref >60)
GFR calc non Af Amer: 45 mL/min — ABNORMAL LOW (ref >60)
Glucose, Bld: 191 mg/dL — ABNORMAL HIGH (ref 70–99)
Potassium: 3.9 mmol/L (ref 3.5–5.1)
Sodium: 141 mmol/L (ref 135–145)

## 2020-02-06 LAB — GLUCOSE, CAPILLARY
Glucose-Capillary: 178 mg/dL — ABNORMAL HIGH (ref 70–99)
Glucose-Capillary: 181 mg/dL — ABNORMAL HIGH (ref 70–99)
Glucose-Capillary: 255 mg/dL — ABNORMAL HIGH (ref 70–99)
Glucose-Capillary: 171 mg/dL — ABNORMAL HIGH (ref 70–99)

## 2020-02-06 LAB — CBC
HCT: 30.7 % — ABNORMAL LOW (ref 39.0–52.0)
Hemoglobin: 9.6 g/dL — ABNORMAL LOW (ref 13.0–17.0)
MCH: 28.2 pg (ref 26.0–34.0)
MCHC: 31.3 g/dL (ref 30.0–36.0)
MCV: 90 fL (ref 80.0–100.0)
Platelets: 214 10*3/uL (ref 150–400)
RBC: 3.41 MIL/uL — ABNORMAL LOW (ref 4.22–5.81)
RDW: 17.4 % — ABNORMAL HIGH (ref 11.5–15.5)
WBC: 8.5 10*3/uL (ref 4.0–10.5)
nRBC: 0 % (ref 0.0–0.2)

## 2020-02-06 MED ORDER — ENOXAPARIN SODIUM 40 MG/0.4ML ~~LOC~~ SOLN
40.0000 mg | Freq: Two times a day (BID) | SUBCUTANEOUS | Status: DC
  Administered 2020-02-06 – 2020-02-09 (×6): 40 mg via SUBCUTANEOUS
  Filled 2020-02-06 (×6): qty 0.4

## 2020-02-06 MED ORDER — ENOXAPARIN SODIUM 40 MG/0.4ML ~~LOC~~ SOLN: 40.0000 mg | SUBCUTANEOUS | Status: DC

## 2020-02-06 MED ORDER — FUROSEMIDE 10 MG/ML IJ SOLN
80.0000 mg | Freq: Every day | INTRAMUSCULAR | Status: DC
  Administered 2020-02-07 – 2020-02-08 (×2): 80 mg via INTRAVENOUS
  Filled 2020-02-06 (×2): qty 8

## 2020-02-06 MED ORDER — DILTIAZEM HCL ER COATED BEADS 120 MG PO CP24
120.0000 mg | ORAL_CAPSULE | Freq: Every day | ORAL | Status: DC
  Administered 2020-02-06 – 2020-02-09 (×4): 120 mg via ORAL
  Filled 2020-02-06 (×4): qty 1

## 2020-02-06 MED ORDER — FUROSEMIDE 10 MG/ML IJ SOLN
40.0000 mg | Freq: Every day | INTRAMUSCULAR | Status: DC
  Administered 2020-02-06 – 2020-02-07 (×2): 40 mg via INTRAVENOUS
  Filled 2020-02-06 (×2): qty 4

## 2020-02-06 NOTE — TOC Progression Note (Signed)
Transition of Care [facility]) - Progression Note    Patient Details  Name: Randall Vincent MRN: [id number] Date of Birth: 10-07-1940  Transition of Care [facility] CM/SW Contact  Shawn Route, RN Phone Number: 02/06/2020, 1:03 PM  Clinical Narrative:     Spoken with patient and son regarding discharge planning, they have changed their minds and would like patient to come home after discharge.  Equipment needed is hospital bed, wheelchair and possibly oxygen which [facility] at [facility] is prepared to deliver once oxygen amount is determined.     Patient will be discharged to son's home at :  [address] [place], [place] [address]   Expected Discharge Plan: Skilled Nursing Facility Barriers to Discharge: Continued Medical Work up  Expected Discharge Plan and Services Expected Discharge Plan: Skilled Nursing Facility     Post Acute Care Choice: Home Health Living arrangements for the past 2 months: Single Family Home                                       Social Determinants of Health (SDOH) Interventions    Readmission Risk Interventions No flowsheet data found.

## 2020-02-06 NOTE — Consult Note (Signed)
NAME: Randall Vincent  DOB: Sep 24, 1940  MRN: [id number]  Date/Time: 02/06/2020 5:00 PM  REQUESTING PROVIDER: Dr.Wouk Subjective:  REASON FOR CONSULT: PJI with enterococcus ? Randall Vincent is a 79 y.o. 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 He also had GI bleed so anticoagulation was stopped- he underwent upper and lower scope and it was essentially okay with no obvious source of bleeding  He was readmitted between 01/27/20-01/31/20 for AKI and CHF Echo showed EF of 50-55% He was readmitted on 02/02/20 with sob and found to have pulmonary edema Vitals were temp of 98, BP 150.52, HR 50, RR 31, sats 91%. Hb 7.1, WBC 8.6, plt 251, NA 142, cr 1.57, BNP of 1387 He was transfused 2 units of PRBC   He had both knees replaced in 2008 , 3 months apart. He had staph aureus infection 3 months after the surgery in the rt knee- He had capsule exchange and got 6 weeks of iV antibiotic and was on keflex for many years. The knee healed. Past Medical History:  Diagnosis Date  . A-fib (HCC)   . Diabetes (HCC)   . Hyperlipidemia   . Hypertension     Past Surgical History:  Procedure Laterality Date  . BLADDER SURGERY    . BYPASS GRAFT    . CARDIOVERSION    . COLON SURGERY    . COLONOSCOPY N/A 01/15/2020   Procedure: COLONOSCOPY;  Surgeon: Pasty Spillers, MD;  Location: [facility] ENDOSCOPY;  Service: Endoscopy;  Laterality: N/A;  . COLONOSCOPY N/A 01/16/2020   Procedure: COLONOSCOPY;  Surgeon: Pasty Spillers, MD;  Location: [facility] ENDOSCOPY;  Service: Endoscopy;  Laterality: N/A;  . ESOPHAGOGASTRODUODENOSCOPY N/A 01/13/2020   Procedure: ESOPHAGOGASTRODUODENOSCOPY (EGD);  Surgeon: Pasty Spillers, MD;  Location: [facility] ENDOSCOPY;  Service:  Endoscopy;  Laterality: N/A;  . REPLACEMENT TOTAL KNEE BILATERAL    . STENT PLACEMENT VASCULAR ([facility] HX)    . TOTAL KNEE REVISION Left 01/16/2020   Procedure: TOTAL KNEE REVISION;  Surgeon: Lyndle Herrlich, MD;  Location: [facility] ORS;  Service: Orthopedics;  Laterality: Left;    Social History   Socioeconomic History  . Marital status: Widowed    Spouse name: Not on file  . Number of children: Not on file  . Years of education: Not on file  . Highest education level: Not on file  Occupational History  . Not on file  Tobacco Use  . Smoking status: Former Smoker    Quit date: 1991    Years since quitting: 30.7  . Smokeless tobacco: Never Used  Substance and Sexual Activity  . Alcohol use: Not on file  . Drug use: Not on file  . Sexual activity: Not on file  Other Topics Concern  . Not on file  Social History Narrative  . Not on file   Social Determinants of Health   Financial Resource Strain:   . Difficulty of Paying Living Expenses: Not on file  Food Insecurity:   . Worried About Programme researcher, broadcasting/film/video in the Last Year: Not on file  . Ran Out of Food in the Last Year: Not on file  Transportation Needs:   . Lack of Transportation (Medical): Not on file  . Lack of Transportation (  Non-Medical): Not on file  Physical Activity:   . Days of Exercise per Week: Not on file  . Minutes of Exercise per Session: Not on file  Stress:   . Feeling of Stress : Not on file  Social Connections:   . Frequency of Communication with Friends and Family: Not on file  . Frequency of Social Gatherings with Friends and Family: Not on file  . Attends Religious Services: Not on file  . Active Member of Clubs or Organizations: Not on file  . Attends Banker Meetings: Not on file  . Marital Status: Not on file  Intimate Partner Violence:   . Fear of Current or Ex-Partner: Not on file  . Emotionally Abused: Not on file  . Physically Abused: Not on file  . Sexually Abused: Not on file     History reviewed. No pertinent family history. No Known Allergies ID  Recent  Procedure Surgery Injections Trauma Sick contacts Travel Antibiotic use Food- raw/exotic Steroid/immune suppressants/splenectomy/Hardware Animal bites Tick exposure Water sports Fishing/hunting/animal bird exposure ? Current Facility-Administered Medications  Medication Dose Route Frequency Provider Last Rate Last Admin  . 0.9 %  sodium chloride infusion  250 mL Intravenous PRN Earlie Lou L, MD 10 mL/hr at 02/04/20 0900 250 mL at 02/04/20 0900  . 0.9 %  sodium chloride infusion   Intravenous Once Earlie Lou L, MD      . acetaminophen (TYLENOL) tablet 650 mg  650 mg Oral Q4H PRN Rometta Emery, MD      . amiodarone (PACERONE) tablet 200 mg  200 mg Oral Daily Erick Blinks, MD   200 mg at 02/06/20 0912  . ampicillin (OMNIPEN) 2 g in sodium chloride 0.9 % 100 mL IVPB  2 g Intravenous Q4H Ronnald Ramp, RPH 300 mL/hr at 02/06/20 1559 2 g at 02/06/20 1559  . atorvastatin (LIPITOR) tablet 40 mg  40 mg Oral QPM Erick Blinks, MD   40 mg at 02/06/20 1641  . carvedilol (COREG) tablet 3.125 mg  3.125 mg Oral BID WC Leanora Ivanoff, PA-C   3.125 mg at 02/06/20 0912  . diltiazem (CARDIZEM CD) 24 hr capsule 120 mg  120 mg Oral Daily Leanora Ivanoff, PA-C   120 mg at 02/06/20 0913  . enoxaparin (LOVENOX) injection 40 mg  40 mg Subcutaneous Q12H Wouk, Wilfred Curtis, MD   40 mg at 02/06/20 1641  . furosemide (LASIX) injection 40 mg  40 mg Intravenous Q1500 Kathrynn Running, MD   40 mg at 02/06/20 1557  . [START ON 02/07/2020] furosemide (LASIX) injection 80 mg  80 mg Intravenous Daily Wouk, Wilfred Curtis, MD      . insulin aspart (novoLOG) injection 0-15 Units  0-15 Units Subcutaneous TID WC Erick Blinks, MD   8 Units at 02/06/20 1641  . insulin aspart (novoLOG) injection 0-5 Units  0-5 Units Subcutaneous QHS Memon, Durward Mallard, MD      . ipratropium-albuterol (DUONEB) 0.5-2.5 (3) MG/3ML nebulizer solution 3 mL   3 mL Nebulization Q6H PRN Erick Blinks, MD   3 mL at 02/06/20 0108  . ondansetron (ZOFRAN) injection 4 mg  4 mg Intravenous Q6H PRN Mikeal Hawthorne, Mohammad L, MD      . sodium chloride flush (NS) 0.9 % injection 3 mL  3 mL Intravenous Q12H Rometta Emery, MD   3 mL at 02/06/20 0913  . sodium chloride flush (NS) 0.9 % injection 3 mL  3 mL Intravenous PRN Rometta Emery, MD   3 mL  at 02/04/20 0901  . spironolactone (ALDACTONE) tablet 25 mg  25 mg Oral Daily Erick Blinks, MD   25 mg at 02/06/20 0912  . traMADol (ULTRAM) tablet 50 mg  50 mg Oral Q8H PRN Erick Blinks, MD   50 mg at 02/06/20 6283     Abtx:  Anti-infectives (From admission, onward)   Start     Dose/Rate Route Frequency Ordered Stop   02/05/20 1200  ampicillin (OMNIPEN) 2 g in sodium chloride 0.9 % 100 mL IVPB        2 g 300 mL/hr over 20 Minutes Intravenous Every 4 hours 02/05/20 0956 02/22/20 2359   02/03/20 1200  ampicillin (OMNIPEN) 2 g in sodium chloride 0.9 % 100 mL IVPB  Status:  Discontinued        2 g 300 mL/hr over 20 Minutes Intravenous Every 6 hours 02/03/20 1008 02/05/20 0956      REVIEW OF SYSTEMS:  Const: negative fever, negative chills, ++ weight gain due to fluid Eyes: left eye strabismus ENT: negative coryza, negative sore throat Resp: cough, , dyspnea Cards: negative for chest pain, palpitations, has lower extremity edema GU: negative for frequency, dysuria and hematuria GI: Negative for abdominal pain, diarrhea, bleeding, constipation Skin: negative for rash and pruritus Heme: negative for easy bruising and gum/nose bleeding MS:  Weakness, left leg pain Neurolo:negative for headaches, dizziness, vertigo, memory problems  Psych: negative for feelings of anxiety, depression  llergy/Immunology- negative for any medication or food allergies ? Pertinent Positives include : Objective:  VITALS:  BP (!) 151/60 (BP Location: Left Arm)   Pulse (!) 57   Temp 97.8 F (36.6 C) (Oral)   Resp 19   Ht  5\' 8"  (1.727 m)   Wt 122.7 kg   SpO2 90%   BMI 41.13 kg/m  PHYSICAL EXAM:  General: Alert, cooperative, no distress, appears stated age.  Head: Normocephalic, without obvious abnormality, atraumatic. Eyes:left eye covered with dark glass ENT Nares normal. No drainage or sinus tenderness. Lips, mucosa, and tongue normal. No Thrush Neck: Supple, symmetrical, no adenopathy, thyroid: non tender no carotid bruit and no JVD. Back: No CVA tenderness. Lungs: b/la ir netry crepts bases Sternal scar. Heart: Regular rate and rhythm, no murmur, rub or gallop. Abdomen: Soft, non-tender,not distended. Bowel sounds normal. No masses Extremities: left leg splint Skin: No rashes or lesions. Or bruising Lymph: Cervical, supraclavicular normal. Neurologic: Grossly non-focal Pertinent Labs Lab Results CBC    Component Value Date/Time   WBC 8.5 02/06/2020 0635   RBC 3.41 (L) 02/06/2020 0635   HGB 9.6 (L) 02/06/2020 0635   HCT 30.7 (L) 02/06/2020 0635   PLT 214 02/06/2020 0635   MCV 90.0 02/06/2020 0635   MCH 28.2 02/06/2020 0635   MCHC 31.3 02/06/2020 0635   RDW 17.4 (H) 02/06/2020 0635   LYMPHSABS 0.6 (L) 02/05/2020 0435   MONOABS 0.7 02/05/2020 0435   EOSABS 0.2 02/05/2020 0435   BASOSABS 0.0 02/05/2020 0435    CMP Latest Ref Rng & Units 02/06/2020 02/05/2020 02/04/2020  Glucose 70 - 99 mg/dL 02/06/2020) 151(V) 616(W)  BUN 8 - 23 mg/dL 737(T) 06(Y) 69(S)  Creatinine 0.61 - 1.24 mg/dL 85(I) 6.27(O) 3.50(K)  Sodium 135 - 145 mmol/L 141 142 142  Potassium 3.5 - 5.1 mmol/L 3.9 3.8 4.0  Chloride 98 - 111 mmol/L 96(L) 97(L) 98  CO2 22 - 32 mmol/L 34(H) 36(H) 36(H)  Calcium 8.9 - 10.3 mg/dL 8.2(L) 8.2(L) 8.6(L)  Total Protein 6.5 - 8.1 g/dL - 5.5(L) -  Total Bilirubin 0.3 - 1.2 mg/dL - 0.8 -  Alkaline Phos 38 - 126 U/L - 101 -  AST 15 - 41 U/L - 22 -  ALT 0 - 44 U/L - 27 -      Microbiology: Recent Results (from the past 240 hour(s))  MRSA PCR Screening     Status: None   Collection  Time: 01/29/20  8:17 PM   Specimen: Nasal Mucosa; Nasopharyngeal  Result Value Ref Range Status   MRSA by PCR NEGATIVE NEGATIVE Final    Comment:        The GeneXpert MRSA Assay (FDA approved for NASAL specimens only), is one component of a comprehensive MRSA colonization surveillance program. It is not intended to diagnose MRSA infection nor to guide or monitor treatment for MRSA infections. Performed at [facility], [address] [place], [place] [address]   SARS Coronavirus 2 by RT PCR (hospital order, performed in [facility] hospital lab) Nasopharyngeal Nasopharyngeal Swab     Status: None   Collection Time: 01/31/20 10:34 AM   Specimen: Nasopharyngeal Swab  Result Value Ref Range Status   SARS Coronavirus 2 NEGATIVE NEGATIVE Final    Comment: (NOTE) SARS-CoV-2 target nucleic acids are NOT DETECTED.  The SARS-CoV-2 RNA is generally detectable in upper and lower respiratory specimens during the acute phase of infection. The lowest concentration of SARS-CoV-2 viral copies this assay can detect is 250 copies / mL. A negative result does not preclude SARS-CoV-2 infection and should not be used as the sole basis for treatment or other patient management decisions.  A negative result may occur with improper specimen collection / handling, submission of specimen other than nasopharyngeal swab, presence of viral mutation(s) within the areas targeted by this assay, and inadequate number of viral copies (<250 copies / mL). A negative result must be combined with clinical observations, patient history, and epidemiological information.  Fact Sheet for Patients:   BoilerBrush.com.cy  Fact Sheet for Healthcare Providers: https://pope.com/  This test is not yet approved or  cleared by the [place] FDA and has been authorized for detection and/or diagnosis of SARS-CoV-2 by FDA under an Emergency Use Authorization (EUA).   This EUA will remain in effect (meaning this test can be used) for the duration of the COVID-19 declaration under Section 564(b)(1) of the Act, 21 U.S.C. section 360bbb-3(b)(1), unless the authorization is terminated or revoked sooner.  Performed at [facility], [address] [place], [place] [address]   SARS Coronavirus 2 by RT PCR (hospital order, performed in [facility] hospital lab) Nasopharyngeal Nasopharyngeal Swab     Status: None   Collection Time: 02/02/20  7:18 PM   Specimen: Nasopharyngeal Swab  Result Value Ref Range Status   SARS Coronavirus 2 NEGATIVE NEGATIVE Final    Comment: (NOTE) SARS-CoV-2 target nucleic acids are NOT DETECTED.  The SARS-CoV-2 RNA is generally detectable in upper and lower respiratory specimens during the acute phase of infection. The lowest concentration of SARS-CoV-2 viral copies this assay can detect is 250 copies / mL. A negative result does not preclude SARS-CoV-2 infection and should not be used as the sole basis for treatment or other patient management decisions.  A negative result may occur with improper specimen collection / handling, submission of specimen other than nasopharyngeal swab, presence of viral mutation(s) within the areas targeted by this assay, and inadequate number of viral copies (<250 copies / mL). A negative result must be combined with clinical observations, patient history, and  epidemiological information.  Fact Sheet for Patients:   BoilerBrush.com.cyhttps://www.fda.gov/media/136312/download  Fact Sheet for Healthcare Providers: https://pope.com/https://www.fda.gov/media/136313/download  This test is not yet approved or  cleared by the [place] FDA and has been authorized for detection and/or diagnosis of SARS-CoV-2 by FDA under an Emergency Use Authorization (EUA).  This EUA will remain in effect (meaning this test can be used) for the duration of the COVID-19 declaration under Section 564(b)(1) of the Act, 21  U.S.C. section 360bbb-3(b)(1), unless the authorization is terminated or revoked sooner.  Performed at [facility], [address] [place], [place] [address]     IMAGING RESULTS:   I have personally reviewed the films ? Impression/Recommendation ? 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 On amiodarone, carvedilol and diltiazem Off anticoagulation  CAD s/p CABG   ___________________________________________________ Discussed with patient, requesting provider Note:  This document was prepared using Dragon voice recognition software and may include unintentional dictation errors.

## 2020-02-06 NOTE — Progress Notes (Signed)
PHARMACIST - PHYSICIAN COMMUNICATION  CONCERNING:  Enoxaparin (Lovenox) for DVT Prophylaxis    RECOMMENDATION: Patient was prescribed enoxaprin 40mg  q24 hours for VTE prophylaxis.   Filed Weights   02/03/20 1930 02/05/20 0547  Weight: 128 kg (282 lb 3 oz) 122.7 kg (270 lb 8 oz)    Body mass index is 41.13 kg/m.  Estimated Creatinine Clearance: 53.1 mL/min (A) (by C-G formula based on SCr of 1.46 mg/dL (H)).   Based on [facility] policy patient is candidate for enoxaparin 40mg  every 12 hours due to BMI being >40.  DESCRIPTION: Pharmacy has adjusted enoxaparin dose per [facility] policy.  Patient is now receiving enoxaparin 40mg  mg every 12 hours    , PharmD Clinical Pharmacist  02/06/2020 1:10 PM

## 2020-02-06 NOTE — Progress Notes (Signed)
PROGRESS NOTE    Randall Vincent  PXT:[id number] DOB: 16-Jan-1941 DOA: 02/02/2020 PCP: Elspeth Cho., MD   Brief Narrative:  HPI per Dr. Earlie Lou on 02/02/20 Randall Vincent is a 79 y.o. 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  He had left septic arthritis and was on IV antibiotics followed by orthopedics during 2 previous hospitalizations.  Patient complained of worsening shortness of breath and swelling.  No fever or chills no nausea vomiting or diarrhea.  At discharge patient was transitioned from IV Lasix to oral Lasix 40 mg daily.  He had an echo done in August showing EF of 50 to 55%.  His weight was getting better.  Given incentive spirometer and to follow-up with his cardiologist over at [facility].  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  He had surgery in August where prosthesis was removed and antibiotic spacer was placed.  He was to stay off anticoagulation again except for aspirin due to his recent GI bleed.  Patient is now back with increasing his weight worsening shortness of breath and found to have bilateral pulmonary edema.  Is being readmitted to the hospital for possible treatment.  Additionally patient was found to have possible infiltrates bilaterally.  Repeat COVID-19 so far is negative.  Is being admitted for further treatment..  ED Course: Temperature is 98 blood pressure 150/52 pulse 50 respirate of 31 oxygen sat 91% on room air.  White count 8.6 hemoglobin 7.1 platelets 251 sodium 142 potassium 4.2 chloride 90 CO2 35 BUN 30 creatinine 1.57 calcium 8.5.  Glucose is 230.  BNP of 1387.   Chest x-ray showed bilateral pleural effusion and pulmonary edema.  Patient be readmitted for further treatment  **Interim History Cardiology was consulted given his recurrent CHF exacerbation recommending continue IV Lasix and chronic amiodarone control on carvedilol as well as diltiazem for rate control of his atrial fibrillation.  Currently not anticoagulated due to his new recent anemia and concern for possible GI bleed.[name]  He was given 2 more units this admission. H/H stable 9/15. Admission continued for continued IV diuresis  Assessment & Plan:   Principal Problem:   Acute exacerbation of CHF (congestive heart failure) (HCC) Active Problems:   CAD, multiple vessel   Atrial fibrillation status post cardioversion 10/22/19 [facility])   Essential hypertension   DM (diabetes mellitus), type 2 (HCC)   Symptomatic anemia   Suspect Septic arthritis (HCC)   Chronic kidney disease, stage 3a   A-fib (HCC)   CHF exacerbation (HCC)  Acute Respiratory Failure with Hypoxia in the setting of Acute on Chronic Diastolic CHF Acute exacerbation of congestive heart failure -Patient has diastolic dysfunction.    -BNP on Admission was 1,387.0 -CXR showed "Interstitial and airspace opacities diffusely likely reflect edema/CHF. More confluent opacity at the left lung base could reflect pneumonia. Small effusions.." -He has been started on IV diuretics, today given mild bump in cr cardiology decreasing IV lasix to 80 AM and 40 pm -Fair urine output, 1350 yesterday.  Continues to have evidence of volume overload.   -Respiratory status not back to baseline but improving. On 5 L currently, was discharged on O2  last hospitalization -Strict I's and O's and daily weights, is net negative -Continuous pulse oximetry maintain O2 saturation greater than 90% -Continue supplemental oxygen via nasal cannula and wean as tolerated -Will need an ambulatory home O2 screen prior to discharge  Symptomatic Anemia Anemia of  Chronic Kidney Disease -Hemoglobin down to 7.0 on admission.   -He has not noticed any significant bleeding in his stools or other sites.  He was transfused 2 units of PRBC since he was short of breath and had decompensated CHF.   -Follow-up hemoglobins stable in the 9s -Continue to monitor closely.  Stool guaiac negative  Paroxysmal Atrial Fibrillation -Currently in sinus rhythm.   -atient underwent ablation procedure in June and has been off of anticoagulation.  -Continue rate control appropriately.  Continue on amiodarone, carvedilol and diltiazem -Cardiology consulted for further evaluation recommendations and appreciate their management  CAD -This is stable at baseline.  -No complaints of chest pain at this time.  -Continue treatment with carvedilol 3.125 mg p.o. twice daily, and atorvastatin 40 g p.o. daily  Diabetes Mellitus Type 2  - controlled - Continue with moderate NovoLog sliding scale insulin AC and at bedtime  Septic arthritis of the Left Leg -Continue with his home ampicillin.   -He will continue on IV antibiotics through 10/2.   -Plans to follow-up with orthopedics.  AKI on Chronic kidney disease stage IIIa - kidney function now back to baseline with IV diuresis  Essential Hypertension - moderately well controlled -C/w Carvedilol 3.125 mg po BID and Diltiazem 120 mg po Daily (reduced by cardiology -lasix as above -Continue blood pressure monitoring per Protocol   Leukocytosis -resolved  Morbid Obesity -Estimated body mass index is 41.13 kg/m as calculated from the following:   Height as of this encounter:  (1.727 m).   Weight as of this encounter: 122.7 kg. -Weight Loss and Dietary Counseling   DVT prophylaxis: lovenox Code Status: FULL COE Family Communication: Discussed with Son at Bedside  Disposition Plan: home with home health  Status is: Inpatient  Remains inpatient appropriate because:Unsafe d/c plan, IV treatments appropriate  due to intensity of illness or inability to take PO and Inpatient level of care appropriate due to severity of illness   Dispo: The patient is from: Home              Anticipated d/c is to: Home              Anticipated d/c date is: 2 days              Patient currently is not medically stable to d/c.  Consultants:   Cardiology   Procedures: None  Antimicrobials:  Anti-infectives (From admission, onward)   Start     Dose/Rate Route Frequency Ordered Stop   02/05/20 1200  ampicillin (OMNIPEN) 2 g in sodium chloride 0.9 % 100 mL IVPB        2 g 300 mL/hr over 20 Minutes Intravenous Every 4 hours 02/05/20 0956 02/22/20 2359   02/03/20 1200  ampicillin (OMNIPEN) 2 g in sodium chloride 0.9 % 100 mL IVPB  Status:  Discontinued        2 g 300 mL/hr over 20 Minutes Intravenous Every 6 hours 02/03/20 1008 02/05/20 0956        Subjective: Seen and examined at bedside and thinks he says his breathing continues to improve but still not back to baseline.  Denies any chest pain, lightheadedness or dizziness.  No nausea or vomiting.  No other concerns or complaints at this time. No bleeding  Objective: Vitals:   02/05/20 1947 02/06/20 0543 02/06/20 0829 02/06/20 1223  BP: (!) 141/55 (!) 143/59 (!) 154/62 (!) 148/66  Pulse: (!) 59 (!) 59 62 (!) 55  Resp: 20 20 19 19   Temp: 97.6 F (36.4 C) 97.8 F (36.6 C) 97.7 F (36.5 C) 97.9 F (36.6 C)  TempSrc: Oral Oral  Oral  SpO2: 97% 100% 99% 100%  Weight:      Height:        Intake/Output Summary (Last 24 hours) at 02/06/2020 1256 Last data filed at 02/06/2020 1211 Gross per 24 hour  Intake 790 ml  Output 900 ml  Net -110 ml   Filed Weights   02/03/20 1930 02/05/20 0547  Weight: 128 kg 122.7 kg   Examination: Physical Exam:  Constitutional: WN/WD morbidly obese Caucasian male currently in NAD and appears calm and comfortable Eyes: Lids and conjunctivae normal, sclerae anicteric  ENMT: External Ears, Nose appear normal. Grossly  normal hearing.  Respiratory: Diminished to auscultation bilaterally with faint breath sounds, rales at bases.  Normal respiratory effort and patient is not tachypenic. No accessory muscle use.  Cardiovascular: RRR, no murmurs / rubs / gallops.  1+ lower extremity edema Abdomen: Soft, non-tender, distended secondary by habitus.  Bowel sounds positive.  GU: Deferred. Musculoskeletal: No clubbing / cyanosis of digits/nails.  Left leg is immobilized from his septic arthritis Skin: No rashes, lesions, ulcers on limited skin evaluation. No induration; Warm and dry.  Neurologic: symmetric movements  Psychiatric: Normal judgment and insight. Alert and oriented x 3. Normal mood and appropriate affect.   Data Reviewed: I have personally reviewed following labs and imaging studies  CBC: Recent Labs  Lab 02/03/20 0353 02/03/20 1405 02/04/20 0535 02/05/20 0435 02/06/20 0635  WBC 10.8* 11.7* 10.0 8.5 8.5  NEUTROABS  --   --   --  6.8  --   HGB 7.5* 9.4* 9.5* 9.2* 9.6*  HCT 23.9* 29.5* 30.2* 29.5* 30.7*  MCV 86.0 87.8 90.7 90.5 90.0  PLT 269 270 238 204 214   Basic Metabolic Panel: Recent Labs  Lab 01/31/20 0644 01/31/20 0644 02/02/20 1918 02/03/20 0353 02/04/20 0535 02/05/20 0435 02/06/20 0635  NA 141  --  141  --  142 142 141  K 4.5  --  4.0  --  4.0 3.8 3.9  CL 100  --  98  --  98 97* 96*  CO2 33*  --  35*  --  36* 36* 34*  GLUCOSE 168*  --  230*  --  182* 190* 191*  BUN 35*  --  32*  --  27* 28* 25*  CREATININE 1.73*   < > 1.57* 1.59* 1.56* 1.36* 1.46*  CALCIUM 8.2*  --  8.5*  --  8.6* 8.2* 8.2*  MG  --   --   --   --   --  1.9  --   PHOS  --   --   --   --   --  3.3  --    < > = values in this interval not displayed.   GFR: Estimated Creatinine Clearance: 53.1 mL/min (A) (by C-G formula based on SCr of 1.46 mg/dL (H)). Liver Function Tests: Recent Labs  Lab 02/02/20 1918 02/05/20 0435  AST 18 22  ALT 22 27  ALKPHOS 96 101  BILITOT 0.7 0.8  PROT 5.5* 5.5*  ALBUMIN  2.4* 2.5*   No results for input(s):  LIPASE, AMYLASE in the last 168 hours. No results for input(s): AMMONIA in the last 168 hours. Coagulation Profile: No results for input(s): INR, PROTIME in the last 168 hours. Cardiac Enzymes: No results for input(s): CKTOTAL, CKMB, CKMBINDEX, TROPONINI in the last 168 hours. BNP (last 3 results) No results for input(s): PROBNP in the last 8760 hours. HbA1C: No results for input(s): HGBA1C in the last 72 hours. CBG: Recent Labs  Lab 02/05/20 0746 02/05/20 1141 02/05/20 1625 02/06/20 0826 02/06/20 1210  GLUCAP 183* 275* 194* 178* 181*   Lipid Profile: No results for input(s): CHOL, HDL, LDLCALC, TRIG, CHOLHDL, LDLDIRECT in the last 72 hours. Thyroid Function Tests: No results for input(s): TSH, T4TOTAL, FREET4, T3FREE, THYROIDAB in the last 72 hours. Anemia Panel: No results for input(s): VITAMINB12, FOLATE, FERRITIN, TIBC, IRON, RETICCTPCT in the last 72 hours. Sepsis Labs: No results for input(s): PROCALCITON, LATICACIDVEN in the last 168 hours.  Recent Results (from the past 240 hour(s))  MRSA PCR Screening     Status: None   Collection Time: 01/29/20  8:17 PM   Specimen: Nasal Mucosa; Nasopharyngeal  Result Value Ref Range Status   MRSA by PCR NEGATIVE NEGATIVE Final    Comment:        The GeneXpert MRSA Assay (FDA approved for NASAL specimens only), is one component of a comprehensive MRSA colonization surveillance program. It is not intended to diagnose MRSA infection nor to guide or monitor treatment for MRSA infections. Performed at [facility], [address] [place], [place] [address]   SARS Coronavirus 2 by RT PCR (hospital order, performed in [facility] hospital lab) Nasopharyngeal Nasopharyngeal Swab     Status: None   Collection Time: 01/31/20 10:34 AM   Specimen: Nasopharyngeal Swab  Result Value Ref Range Status   SARS Coronavirus 2 NEGATIVE NEGATIVE Final    Comment: (NOTE) SARS-CoV-2 target  nucleic acids are NOT DETECTED.  The SARS-CoV-2 RNA is generally detectable in upper and lower respiratory specimens during the acute phase of infection. The lowest concentration of SARS-CoV-2 viral copies this assay can detect is 250 copies / mL. A negative result does not preclude SARS-CoV-2 infection and should not be used as the sole basis for treatment or other patient management decisions.  A negative result may occur with improper specimen collection / handling, submission of specimen other than nasopharyngeal swab, presence of viral mutation(s) within the areas targeted by this assay, and inadequate number of viral copies (<250 copies / mL). A negative result must be combined with clinical observations, patient history, and epidemiological information.  Fact Sheet for Patients:   BoilerBrush.com.cyhttps://www.fda.gov/media/136312/download  Fact Sheet for Healthcare Providers: https://pope.com/https://www.fda.gov/media/136313/download  This test is not yet approved or  cleared by the [place] FDA and has been authorized for detection and/or diagnosis of SARS-CoV-2 by FDA under an Emergency Use Authorization (EUA).  This EUA will remain in effect (meaning this test can be used) for the duration of the COVID-19 declaration under Section 564(b)(1) of the Act, 21 U.S.C. section 360bbb-3(b)(1), unless the authorization is terminated or revoked sooner.  Performed at [facility], [address] [place], [place] [address]   SARS Coronavirus 2 by RT PCR (hospital order, performed in [facility] hospital lab) Nasopharyngeal Nasopharyngeal Swab     Status: None   Collection Time: 02/02/20  7:18 PM   Specimen: Nasopharyngeal Swab  Result Value Ref Range Status   SARS Coronavirus 2 NEGATIVE NEGATIVE Final    Comment: (NOTE) SARS-CoV-2 target nucleic  acids are NOT DETECTED.  The SARS-CoV-2 RNA is generally detectable in upper and lower respiratory specimens during the acute phase of infection. The  lowest concentration of SARS-CoV-2 viral copies this assay can detect is 250 copies / mL. A negative result does not preclude SARS-CoV-2 infection and should not be used as the sole basis for treatment or other patient management decisions.  A negative result may occur with improper specimen collection / handling, submission of specimen other than nasopharyngeal swab, presence of viral mutation(s) within the areas targeted by this assay, and inadequate number of viral copies (<250 copies / mL). A negative result must be combined with clinical observations, patient history, and epidemiological information.  Fact Sheet for Patients:   BoilerBrush.com.cy  Fact Sheet for Healthcare Providers: https://pope.com/  This test is not yet approved or  cleared by the [place] FDA and has been authorized for detection and/or diagnosis of SARS-CoV-2 by FDA under an Emergency Use Authorization (EUA).  This EUA will remain in effect (meaning this test can be used) for the duration of the COVID-19 declaration under Section 564(b)(1) of the Act, 21 U.S.C. section 360bbb-3(b)(1), unless the authorization is terminated or revoked sooner.  Performed at [facility], [address] [place], [place] [address]      RN Pressure Injury Documentation: Pressure Injury 01/29/20 Heel Left Stage 1 -  Intact skin with non-blanchable redness of a localized area usually over a bony prominence. due to knee mobilizer (Active)  01/29/20 0930  Location: Heel  Location Orientation: Left  Staging: Stage 1 -  Intact skin with non-blanchable redness of a localized area usually over a bony prominence.  Wound Description (Comments): due to knee mobilizer  Present on Admission: Yes     Estimated body mass index is 41.13 kg/m as calculated from the following:   Height as of this encounter:  (1.727 m).   Weight as of this encounter: 122.7  kg.  Malnutrition Type:      Malnutrition Characteristics:      Nutrition Interventions:    Radiology Studies: DG Chest 1 View  Result Date: 02/05/2020 CLINICAL DATA:  Shortness of breath EXAM: CHEST  1 VIEW COMPARISON:  02/04/2020, 02/02/2020, 01/27/2020 FINDINGS: Post sternotomy changes. Right upper extremity central venous catheter tip projects over the brachiocephalic region. Cardiomegaly with vascular congestion and diffuse bilateral interstitial and ground-glass opacity most likely reflecting pulmonary edema. Bilateral effusions and basilar consolidation without change. Aortic atherosclerosis. No pneumothorax. IMPRESSION: No significant interval change in appearance of the chest with cardiomegaly, vascular congestion, pulmonary edema and bilateral effusions with basilar consolidation. Electronically Signed   By: Jasmine Pang M.D.   On: 02/05/2020 02:12   Scheduled Meds:  amiodarone  200 mg Oral Daily   atorvastatin  40 mg Oral QPM   carvedilol  3.125 mg Oral BID WC   diltiazem  120 mg Oral Daily   furosemide  40 mg Intravenous Q1500   [START ON 02/07/2020] furosemide  80 mg Intravenous Daily   insulin aspart  0-15 Units Subcutaneous TID WC   insulin aspart  0-5 Units Subcutaneous QHS   sodium chloride flush  3 mL Intravenous Q12H   spironolactone  25 mg Oral Daily   Continuous Infusions:  sodium chloride 250 mL (02/04/20 0900)   sodium chloride     ampicillin (OMNIPEN) IV 2 g (02/06/20 1236)    LOS: 3 days   Silvano Bilis, MD [facility] PAGER is on AMION  If 7PM-7AM, please contact  night-coverage [email or web address]

## 2020-02-06 NOTE — Progress Notes (Signed)
   Heart Failure Nurse Navigator Note  HFpEF 50-55%  Visit today with patient and son.  Patient had presented on 02/02/2020, had just been discharged to skilled facility on 01/31/2020.  On admission he complained of increasing SOB, lower extremity edema and orthopnea.   CXR in the ED revealed bilateral pulmonary edema.  BNP 1,387.  He was also noted to be anemic with hemoglobin of 7.0.   Comorbidities:  Morbid obesity Atrial fibrillation Diabetes CKD stage III Obesity Hypertension Anemia   Medications:  Amiodarone 200 mg daily Lipitor 40 mg daily Cardizem CD 180mg  daily Spironolactone 25 mg daily Coreg 6.25 mg BID Lasix 80 mg IV BID  On previous hospitalization he was discharged on Ramipril 10 mg daily.  Labs:  Sodium 141, potassium 3.9, BUN 25, Creatinine 1.45 (yesterday 1.56), hemoglobin 9.6.  Weight-not completed today.  Intake-910 ml Output-1000 ml  Discussed with patient and son the importance of keeping on top of his heart failure symptoms.  The importance of daily weights and recording, report weight gain or change in symptoms such as increasing SOB, PND, orthopnea,early satiety and increasing lower extremity edema.  Also cautioned on use of salt substitute that contains potassium chloride.  Eat fresh and frozen fruits and vegetables, abstaining from convenience or processed  foods.  Also to limit fluid intake.   Patient states that his breathing is better, still has a cough and the phlegm still seems to get stuck in his throat.    Will continue to follow.  RN, CHFN

## 2020-02-06 NOTE — Progress Notes (Signed)
[facility] Cardiology    SUBJECTIVE: The patient reports feeling better and stronger this morning. He reports that his breathing is better, and feels like he is improving. He denies chest pain or palpitations.   Vitals:   02/05/20 1624 02/05/20 1947 02/06/20 0543 02/06/20 0829  BP: (!) 145/63 (!) 141/55 (!) 143/59 (!) 154/62  Pulse: (!) 54 (!) 59 (!) 59 62  Resp: 17 20 20 19   Temp: 98.5 F (36.9 C) 97.6 F (36.4 C) 97.8 F (36.6 C) 97.7 F (36.5 C)  TempSrc:  Oral Oral   SpO2: 100% 97% 100% 99%  Weight:      Height:         Intake/Output Summary (Last 24 hours) at 02/06/2020 0843 Last data filed at 02/06/2020 0421 Gross per 24 hour  Intake 910 ml  Output 1000 ml  Net -90 ml      PHYSICAL EXAM  General:elderly obese gentleman sitting on side of bed in no acute distress HEENT:Normocephalic and atramatic Neck: No JVD.  Lungs:slight increased effort of breathing on supplemental O2 via [place] when talking. Diminished breath sounds throughout especially bases, no crackles Heart:HRRR . Normal S1 and S2 without gallops or murmurs.  Abdomen:soft, nontender, nondistended Msk: Back normal,gait not assessed. Left knee brace. Extremities:venous stasis skin changes, trace right pedaledema, mild left pedal edema.  Neuro: Alert and oriented X 3. Psych: Good affect, responds appropriately  LABS: Basic Metabolic Panel: Recent Labs    02/05/20 0435 02/06/20 0635  NA 142 141  K 3.8 3.9  CL 97* 96*  CO2 36* 34*  GLUCOSE 190* 191*  BUN 28* 25*  CREATININE 1.36* 1.46*  CALCIUM 8.2* 8.2*  MG 1.9  --   PHOS 3.3  --    Liver Function Tests: Recent Labs    02/05/20 0435  AST 22  ALT 27  ALKPHOS 101  BILITOT 0.8  PROT 5.5*  ALBUMIN 2.5*   No results for input(s): LIPASE, AMYLASE in the last 72 hours. CBC: Recent Labs    02/05/20 0435 02/06/20 0635  WBC 8.5 8.5  NEUTROABS 6.8  --   HGB 9.2* 9.6*  HCT 29.5* 30.7*  MCV 90.5 90.0  PLT 204 214   Cardiac  Enzymes: No results for input(s): CKTOTAL, CKMB, CKMBINDEX, TROPONINI in the last 72 hours. BNP: Invalid input(s): POCBNP D-Dimer: No results for input(s): DDIMER in the last 72 hours. Hemoglobin A1C: No results for input(s): HGBA1C in the last 72 hours. Fasting Lipid Panel: No results for input(s): CHOL, HDL, LDLCALC, TRIG, CHOLHDL, LDLDIRECT in the last 72 hours. Thyroid Function Tests: No results for input(s): TSH, T4TOTAL, T3FREE, THYROIDAB in the last 72 hours.  Invalid input(s): FREET3 Anemia Panel: No results for input(s): VITAMINB12, FOLATE, FERRITIN, TIBC, IRON, RETICCTPCT in the last 72 hours.  DG Chest 1 View  Result Date: 02/05/2020 CLINICAL DATA:  Shortness of breath EXAM: CHEST  1 VIEW COMPARISON:  02/04/2020, 02/02/2020, 01/27/2020 FINDINGS: Post sternotomy changes. Right upper extremity central venous catheter tip projects over the brachiocephalic region. Cardiomegaly with vascular congestion and diffuse bilateral interstitial and ground-glass opacity most likely reflecting pulmonary edema. Bilateral effusions and basilar consolidation without change. Aortic atherosclerosis. No pneumothorax. IMPRESSION: No significant interval change in appearance of the chest with cardiomegaly, vascular congestion, pulmonary edema and bilateral effusions with basilar consolidation. Electronically Signed   By: 03/28/2020 M.D.   On: 02/05/2020 02:12   DG Chest 1 View  Result Date: 02/04/2020 CLINICAL DATA:  Shortness of breath EXAM: CHEST  1 VIEW COMPARISON:  02/02/2020 FINDINGS: Cardiomegaly. Prior CABG. Diffuse interstitial and alveolar opacities throughout the lungs, similar to prior study. More consolidated appearance in the left lower lobe, similar to prior study. Some improvement in aeration at the right lung base. Small bilateral effusions. No acute bony abnormality. IMPRESSION: Interstitial and airspace opacities diffusely likely reflect edema/CHF. More confluent opacity at the left  lung base could reflect pneumonia. Small effusions. Electronically Signed   By: Charlett Nose M.D.   On: 02/04/2020 10:41     Echo normal LV function with LVEF 50-55%  TELEMETRY: sinus rhythm, 60s  ASSESSMENT AND PLAN:  Principal Problem:   Acute exacerbation of CHF (congestive heart failure) (HCC) Active Problems:   CAD, multiple vessel   Atrial fibrillation status post cardioversion 10/22/19 [facility])   Essential hypertension   DM (diabetes mellitus), type 2 (HCC)   Symptomatic anemia   Suspect Septic arthritis (HCC)   Chronic kidney disease, stage 3a   A-fib (HCC)   CHF exacerbation (HCC)   1. Acute on chronic diastolic CHF, recently discharged 02/01/2020 for the same, with recurrent weight gain, shortness of breath, peripheral edema, and orthopnea. BNP 1387, troponin flat. Chest xray with pulmonary edema and small bilateral effusions, with no interval changes on chest xray. Receiving IV Lasix and spironolactone. Symptoms improving status post blood transfusions and diuresis. Complicated by anemia and CKD stage III. Unlikely caused by ischemia as troponin is flat, ECG is without evidence of ischemia, and patient denies chest pain. Patient's weight not documented today or yesterday, though clinically improving on IV Lasix 80 mg BID. 2. Atrial fibrillation, status post cardioversion 10/2019, currently in sinus rhythm, on amiodarone for rhythm control, diltiazem and carvedilol for rate control, not anticoagulated due to anemia and recent history of GI bleed. 3. Anemia,hemoglobin dropped from 8.4 to 7.0 eight days prior, status post blood transfusions. Hemoglobin 9.6 today 4. CKD stage III, creatinine slightly worse at 1.46, BUN 25, GFR 45. 5. Morbid obesity, BMI 41 6. 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 Coronary artery disease,status post CABG, without chest pain. 8. Bradycardia, asymptomatic, while on Cardizem CD 180 mg and  carvedilol 6.25 mg BID  Recommendations: 1. Management of anemia per hospitalist 2. Continue amiodarone 200 mg for rhythm control and carvedilol and diltiazem for rate control 3. Reduce Cardizem CD to 120 mg daily and carvedilol to 3.125 mg. Continue both for now for HTN and to prevent reflex tachycardia. 4. Defer chronic anticoagulation in light of GI bleed, and patient's request 5. Decrease IV Lasix to 80 mg AM and 40 mg PM due to worsening renal function; would recommend discharging on torsemide. 6. Daily weights, fluid restriction, monitoring I&Os and renal function 7. No further cardiac diagnostics recommended at this time.    Leanora Ivanoff, PA-C 02/06/2020 8:43 AM

## 2020-02-06 NOTE — Care Management Important Message (Signed)
Important Message  Patient Details  Name: Randall Vincent MRN: [id number] Date of Birth: 17-Aug-1940   Medicare Important Message Given:  Yes     [name] 02/06/2020, 1:16 PM

## 2020-02-06 NOTE — Treatment Plan (Signed)
Diagnosis: PJI left knee with enterococcus Baseline Creatinine 1.46    No Known Allergies  OPAT Orders Discharge antibiotics: Ampicillin 12 grams every 24 hours as a continuous infusion  Or Penicillin 24 million units as a continuous infusion every 24 hours Depends on copay and convenience and least fluid  End Date: 02/22/20  [facility] Care Per Protocol:  Labs weekly while on IV antibiotics: __X CBC with differential  _X_ CMP  _X_ ESR _  _X_ Please pull PIC at completion of IV antibiotics  Fax weekly labs to [place] [phone]   Call [phone] with any questions

## 2020-02-06 NOTE — Progress Notes (Signed)
Mobility Specialist - Progress Note   02/06/20 1313  Mobility  Activity Dangled on edge of bed (seated exercises)  Level of Assistance Minimal assist, patient does 75% or more  Mobility Response Tolerated well  Mobility performed by Mobility specialist  $Mobility charge 1 Mobility    Pre-mobility: 55 HR, 1357/56 BP, 97% SpO2 During mobility: 64 HR, 90% SpO2 Post-mobility: 57 HR, 99% SpO2   Pt laying in bed w/ nurse and son present in room upon arrival. Pt agreed to session. Pt able to transition supine to sitting EOB w/ min. Assist. Pt performed seated exercises w/ SBA. The seated exercises consisted of: ankle pumps x 20, seated marches x 20, and seated kicks x 20. Overall, pt tolerated session well. Pt left sitting EOB, waiting for lunch w/ son present in room. Bed alarm set. Call bell and phone placed in reach. Nurse was notified.    Ceazia Harb Mobility Specialist  02/06/20, 1:26 PM

## 2020-02-07 LAB — BASIC METABOLIC PANEL
Anion gap: 10 (ref 5–15)
BUN: 28 mg/dL — ABNORMAL HIGH (ref 8–23)
CO2: 36 mmol/L — ABNORMAL HIGH (ref 22–32)
Calcium: 8.3 mg/dL — ABNORMAL LOW (ref 8.9–10.3)
Chloride: 96 mmol/L — ABNORMAL LOW (ref 98–111)
Creatinine, Ser: 1.47 mg/dL — ABNORMAL HIGH (ref 0.61–1.24)
GFR calc Af Amer: 52 mL/min — ABNORMAL LOW (ref >60)
GFR calc non Af Amer: 45 mL/min — ABNORMAL LOW (ref >60)
Glucose, Bld: 166 mg/dL — ABNORMAL HIGH (ref 70–99)
Potassium: 3.7 mmol/L (ref 3.5–5.1)
Sodium: 142 mmol/L (ref 135–145)

## 2020-02-07 LAB — CBC
HCT: 32.2 % — ABNORMAL LOW (ref 39.0–52.0)
Hemoglobin: 9.8 g/dL — ABNORMAL LOW (ref 13.0–17.0)
MCH: 27.8 pg (ref 26.0–34.0)
MCHC: 30.4 g/dL (ref 30.0–36.0)
MCV: 91.5 fL (ref 80.0–100.0)
Platelets: 211 10*3/uL (ref 150–400)
RBC: 3.52 MIL/uL — ABNORMAL LOW (ref 4.22–5.81)
RDW: 17.7 % — ABNORMAL HIGH (ref 11.5–15.5)
WBC: 9.2 10*3/uL (ref 4.0–10.5)
nRBC: 0 % (ref 0.0–0.2)

## 2020-02-07 LAB — GLUCOSE, CAPILLARY
Glucose-Capillary: 142 mg/dL — ABNORMAL HIGH (ref 70–99)
Glucose-Capillary: 241 mg/dL — ABNORMAL HIGH (ref 70–99)
Glucose-Capillary: 181 mg/dL — ABNORMAL HIGH (ref 70–99)
Glucose-Capillary: 198 mg/dL — ABNORMAL HIGH (ref 70–99)

## 2020-02-07 NOTE — Progress Notes (Signed)
Occupational Therapy Treatment Patient Details Name: Randall Vincent MRN: [id number] DOB: Dec 15, 1940 Today's Date: 02/07/2020    History of present illness Randall Vincent is a 79 y.o. male with history of atrial fibrillation, diabetes, hypertension, CHF who presents with complaints of increased short of breath and fluid buildup.  Patient was recently discharged from the hospital, review of records demonstrates discharged yesterday after significant diuresis.  Patient reports that he was doing okay but started feeling increased shortness of breath over the last 24 hours with increased work of breathing.  Denies fevers chills or cough.  Is concerned that he needs further diuresis.  Also recently treated for septic arthritis of the left knee.   OT comments  Pt seen for OT treatment on this date. Upon arrival to room pt seated in bed, HOB elevated, with son and daughter-in-law present. Pt A&O x 4, reporting no pain. Pt and family report that pt had just had a near-fall while transferring from [facility] to bed, required +3 A to help pt into bed. Provided educ on toileting management, EC, HEP, proper use of DME. Pt/family verbalized understanding /return demonstrated of instruction provided. Pt making progress toward goals. Pt continues to benefit from skilled OT services to maximize return to PLOF. Pt and family state that they will not consider return to SNF. If going to son's home, pt and family will require considerable skilled OT and home health supports to minimize risk of falls, injury, caregiver burden, and readmission.    Follow Up Recommendations  Home health OT;Supervision/Assistance - 24 hour    Equipment Recommendations  Hospital bed (reacher)    Recommendations for Other Services      Precautions / Restrictions Precautions Precautions: Fall Precaution Comments: High Fall Risk; R PICC Required Braces or Orthoses: Knee Immobilizer - Left Knee Immobilizer - Left: On at all  times Restrictions Weight Bearing Restrictions: Yes LLE Weight Bearing: Touchdown weight bearing LLE Partial Weight Bearing Percentage or Pounds: 20%       Mobility Bed Mobility Overal bed mobility: Needs Assistance       Supine to sit: HOB elevated;Supervision Sit to supine: Mod assist   General bed mobility comments: not performed as pt sitting on BSC upon arrival to room  Transfers Overall transfer level: Needs assistance Equipment used: Rolling walker (2 wheeled) Transfers: Sit to/from Stand Sit to Stand: Min assist;+2 physical assistance         General transfer comment: Required +3 A to transfer from [facility] to bed, following pt becoming "stuck" in rails of BSC and then losing balance.    Balance Overall balance assessment: Needs assistance Sitting-balance support: Feet supported;No upper extremity supported Sitting balance-Leahy Scale: Good Sitting balance - Comments: steady sitting reaching within BOS   Standing balance support: Bilateral upper extremity supported Standing balance-Leahy Scale: Fair Standing balance comment: +2 min A for steadying while pericare performed; pt utilized RW with BUE to maintain TDWB on LLE                           ADL either performed or assessed with clinical judgement   ADL Overall ADL's : Needs assistance/impaired     Grooming: Set up;Sitting   Upper Body Bathing: Sitting;Set up               Toilet Transfer: RW;BSC;Maximal assistance;+2 for safety/equipment Toilet Transfer Details (indicate cue type and reason): As OT came into room, pt was just getting back into bed following a near-fall.  Pt had become "stuck" on rails of BSC and began to fall, was able to land partially on bed. Required +3 A (nurse and 2 family members) to get pt into bed.         Functional mobility during ADLs: Rolling walker;Minimal assistance       Vision       Perception     Praxis      Cognition Arousal/Alertness:  Awake/alert Behavior During Therapy: WFL for tasks assessed/performed Overall Cognitive Status: Within Functional Limits for tasks assessed                                 General Comments: Pt is A and O x 4        Exercises Exercises: Other exercises Other Exercises Other Exercises: Pt, son, and DIL educated on role of OT in acute, vs. rehab, vs HH setting, falls prevention strategies, safe use of AE/DME for ADL management, safe transfer techniques, and routines modifications to support safety and functional indep upon hospital DC. Other Exercises: RLE: hip ab/add, quat sets with 5 second hold, and heels slides x 10, SLR x 5; LLE: min A for elevating knee immobilizer hip ab/add and SLR x 10   Shoulder Instructions       General Comments      Pertinent Vitals/ Pain       Pain Assessment: No/denies pain  Home Living                                          Prior Functioning/Environment              Frequency  Min 2X/week        Progress Toward Goals  OT Goals(current goals can now be found in the care plan section)  Progress towards OT goals: Progressing toward goals  Acute Rehab OT Goals Patient Stated Goal: To go home with son. OT Goal Formulation: With patient/family Time For Goal Achievement: 02/19/20 Potential to Achieve Goals: Good  Plan Discharge plan remains appropriate    Co-evaluation                 AM-PAC OT "6 Clicks" Daily Activity     Outcome Measure   Help from another person eating meals?: A Little Help from another person taking care of personal grooming?: A Little Help from another person toileting, which includes using toliet, bedpan, or urinal?: A Lot Help from another person bathing (including washing, rinsing, drying)?: A Lot Help from another person to put on and taking off regular upper body clothing?: A Little Help from another person to put on and taking off regular lower body clothing?: A  Lot 6 Click Score: 15    End of Session    OT Visit Diagnosis: Other abnormalities of gait and mobility (R26.89);Muscle weakness (generalized) (M62.81);Pain Pain - Right/Left: Left Pain - part of body: Knee   Activity Tolerance Patient tolerated treatment well   Patient Left in bed;with call bell/phone within reach;with bed alarm set;with family/visitor present   Nurse Communication          Time: 5397-6734 OT Time Calculation (min): 25 min  Charges: OT Treatments $Self Care/Home Management : 8-22 mins $Therapeutic Activity: 8-22 mins  Latina Craver, PhD, MS, OTR/L ascom [phone] 02/07/20, 4:18 PM

## 2020-02-07 NOTE — Progress Notes (Signed)
[facility] Cardiology  SUBJECTIVE: Patient laying in bed, reports feeling somewhat better with improved breathing   Vitals:   02/06/20 1621 02/06/20 1923 02/07/20 0438 02/07/20 0732  BP: (!) 151/60 (!) 143/56 (!) 159/67 (!) 180/71  Pulse: (!) 57 (!) 57 63 70  Resp: 19 19 19 17   Temp: 97.8 F (36.6 C) 97.7 F (36.5 C) 98.2 F (36.8 C) 97.6 F (36.4 C)  TempSrc: Oral Oral Oral Oral  SpO2: 90% 96% 94% 93%  Weight:   120.6 kg   Height:         Intake/Output Summary (Last 24 hours) at 02/07/2020 02/09/2020 Last data filed at 02/07/2020 0448 Gross per 24 hour  Intake 1446.97 ml  Output 1350 ml  Net 96.97 ml      PHYSICAL EXAM  General: Well developed, well nourished, in no acute distress HEENT:  Normocephalic and atramatic Neck:  No JVD.  Lungs: Clear bilaterally to auscultation and percussion. Heart: HRRR . Normal S1 and S2 without gallops or murmurs.  Abdomen: Bowel sounds are positive, abdomen soft and non-tender  Msk:  Back normal, normal gait. Normal strength and tone for age. Extremities: No clubbing, cyanosis or edema.   Neuro: Alert and oriented X 3. Psych:  Good affect, responds appropriately   LABS: Basic Metabolic Panel: Recent Labs    02/05/20 0435 02/05/20 0435 02/06/20 0635 02/07/20 0655  NA 142   < > 141 142  K 3.8   < > 3.9 3.7  CL 97*   < > 96* 96*  CO2 36*   < > 34* 36*  GLUCOSE 190*   < > 191* 166*  BUN 28*   < > 25* 28*  CREATININE 1.36*   < > 1.46* 1.47*  CALCIUM 8.2*   < > 8.2* 8.3*  MG 1.9  --   --   --   PHOS 3.3  --   --   --    < > = values in this interval not displayed.   Liver Function Tests: Recent Labs    02/05/20 0435  AST 22  ALT 27  ALKPHOS 101  BILITOT 0.8  PROT 5.5*  ALBUMIN 2.5*   No results for input(s): LIPASE, AMYLASE in the last 72 hours. CBC: Recent Labs    02/05/20 0435 02/05/20 0435 02/06/20 0635 02/07/20 0655  WBC 8.5   < > 8.5 9.2  NEUTROABS 6.8  --   --   --   HGB 9.2*   < > 9.6* 9.8*  HCT 29.5*   < >  30.7* 32.2*  MCV 90.5   < > 90.0 91.5  PLT 204   < > 214 211   < > = values in this interval not displayed.   Cardiac Enzymes: No results for input(s): CKTOTAL, CKMB, CKMBINDEX, TROPONINI in the last 72 hours. BNP: Invalid input(s): POCBNP D-Dimer: No results for input(s): DDIMER in the last 72 hours. Hemoglobin A1C: No results for input(s): HGBA1C in the last 72 hours. Fasting Lipid Panel: No results for input(s): CHOL, HDL, LDLCALC, TRIG, CHOLHDL, LDLDIRECT in the last 72 hours. Thyroid Function Tests: No results for input(s): TSH, T4TOTAL, T3FREE, THYROIDAB in the last 72 hours.  Invalid input(s): FREET3 Anemia Panel: No results for input(s): VITAMINB12, FOLATE, FERRITIN, TIBC, IRON, RETICCTPCT in the last 72 hours.  No results found.   Echo LVEF 50 to 55%  TELEMETRY: Sinus rhythm:  ASSESSMENT AND PLAN:  Principal Problem:   Acute exacerbation of CHF (congestive heart failure) (HCC)  Active Problems:   CAD, multiple vessel   Atrial fibrillation status post cardioversion 10/22/19 [facility])   Essential hypertension   DM (diabetes mellitus), type 2 (HCC)   Symptomatic anemia   Suspect Septic arthritis (HCC)   Chronic kidney disease, stage 3a   A-fib (HCC)   CHF exacerbation (HCC)    1. Acute on chronic diastolic CHF, recently discharged 02/01/2020 for the same, with recurrent weight gain, shortness of breath, peripheral edema, and orthopnea. BNP 1387, troponin flat. Chest xray with pulmonary edema and small bilateral effusions, with no interval changes on chest xray. Receiving IV Lasix and spironolactone. Symptoms improving status post blood transfusions and diuresis. Complicated by anemia and CKD stage III. Unlikely caused by ischemia as troponin is flat, ECG is without evidence of ischemia, and patient denies chest pain.   Patient currently on torsemide 80 mg IV every morning and 40 mg every afternoon. 2. Atrial fibrillation, status post cardioversion 10/2019, currently in  sinus rhythm, on amiodarone for rhythm control, diltiazem and carvedilol for rate control, not anticoagulated due to anemia and recent history of GI bleed. 3. Anemia,hemoglobin dropped from 8.4 to 7.0 eight days prior, status post blood transfusions.Hemoglobin 9.8 today 4. CKD stage III, creatinine slightly worse at 1.47, BUN28, GFR 45. 5. Morbid obesity, BMI 41 6. 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 Coronary artery disease,status post CABG, without chest pain. 8. Bradycardia, asymptomatic, while on Cardizem CD 180 mg and carvedilol 6.25 mg BID  Recommendations  1.  Agree with current therapy 2.  Continue amiodarone for rate and rhythm control 3.  Continue carvedilol and diltiazem 4.  Defer chronic anticoagulation in light of recent GI bleed and anemia 5.  Continue diuresis with furosemide 80 mg morning and 40 mg afternoon 6.  Carefully monitor renal status 7.  Monitor daily weights and I&O's 8.  Defer further cardiac diagnostics at this time 9.  Follow-up with Dr. Heide Scales after discharge   Marcina Millard, MD, PhD, [facility] 02/07/2020 8:12 AM

## 2020-02-07 NOTE — Progress Notes (Signed)
PHARMACY CONSULT NOTE FOR:  OUTPATIENT  PARENTERAL ANTIBIOTIC THERAPY (OPAT)  Indication: enterococcal knee PJI Regimen: PCN G 24 million units q24h as continuous infusion End date: 02/22/2020  IV antibiotic discharge orders are pended. To discharging provider:  please sign these orders via discharge navigator,  Select New Orders & click on the button choice - Manage This Unsigned Work.     Thank you for allowing pharmacy to be a part of this patient's care.  Juliette Alcide, PharmD, BCPS.   Work Cell: [phone] 02/07/2020 11:38 AM

## 2020-02-07 NOTE — Progress Notes (Signed)
Mobility Specialist - Progress Note   02/07/20 1346  Mobility  Activity Contraindicated/medical hold  Mobility performed by Mobility specialist    Pt sitting on recliner w/ 2 family members and nurse present in room upon arrival. Pt agreed to session. VS taken. Pt's resting O2 desat to 82% on RA. Per nurse, 2L O2 placed via [place], O2 sat up to 95% after ~5 minutes. Pt's BP measured and was 197/61. BP measured a second attempt per pt's request, pt's BP was 202/61. Pt's current SBP falls out of safety guidelines for mobility. Deferred session. Will re-attempt at a later date/time when medically appropriate.     Babetta Paterson Mobility Specialist  02/07/20, 1:55 PM

## 2020-02-07 NOTE — Progress Notes (Signed)
PROGRESS NOTE    Randall Vincent  IRC:[id number] DOB: 1940/09/02 DOA: 02/02/2020 PCP: Elspeth Cho., MD   Brief Narrative:  HPI per Dr. Earlie Lou on 02/02/20 Randall Vincent is a 79 y.o. 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  He had left septic arthritis and was on IV antibiotics followed by orthopedics during 2 previous hospitalizations.  Patient complained of worsening shortness of breath and swelling.  No fever or chills no nausea vomiting or diarrhea.  At discharge patient was transitioned from IV Lasix to oral Lasix 40 mg daily.  He had an echo done in August showing EF of 50 to 55%.  His weight was getting better.  Given incentive spirometer and to follow-up with his cardiologist over at [facility].  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  He had surgery in August where prosthesis was removed and antibiotic spacer was placed.  He was to stay off anticoagulation again except for aspirin due to his recent GI bleed.  Patient is now back with increasing his weight worsening shortness of breath and found to have bilateral pulmonary edema.  Is being readmitted to the hospital for possible treatment.  Additionally patient was found to have possible infiltrates bilaterally.  Repeat COVID-19 so far is negative.  Is being admitted for further treatment..  ED Course: Temperature is 98 blood pressure 150/52 pulse 50 respirate of 31 oxygen sat 91% on room air.  White count 8.6 hemoglobin 7.1 platelets 251 sodium 142 potassium 4.2 chloride 90 CO2 35 BUN 30 creatinine 1.57 calcium 8.5.  Glucose is 230.  BNP of 1387.   Chest x-ray showed bilateral pleural effusion and pulmonary edema.  Patient be readmitted for further treatment  **Interim History Cardiology was consulted given his recurrent CHF exacerbation recommending continue IV Lasix and chronic amiodarone control on carvedilol as well as diltiazem for rate control of his atrial fibrillation.  Currently not anticoagulated due to his new recent anemia and concern for possible GI bleed.Randall Vincent  He was given 2 more units this admission. H/H stable 9/15. Admission continued for continued IV diuresis  Assessment & Plan:   Principal Problem:   Acute exacerbation of CHF (congestive heart failure) (HCC) Active Problems:   CAD, multiple vessel   Atrial fibrillation status post cardioversion 10/22/19 [facility])   Essential hypertension   DM (diabetes mellitus), type 2 (HCC)   Symptomatic anemia   Suspect Septic arthritis (HCC)   Chronic kidney disease, stage 3a   A-fib (HCC)   CHF exacerbation (HCC)  Acute Respiratory Failure with Hypoxia in the setting of Acute on Chronic Diastolic CHF Acute exacerbation of congestive heart failure -Patient has diastolic dysfunction.    -BNP on Admission was 1,387.0 -CXR showed "Interstitial and airspace opacities diffusely likely reflect edema/CHF. More confluent opacity at the left lung base could reflect pneumonia. Small effusions.." -He has been started on IV diuretics, today given mild bump in cr cardiology decreasing IV lasix to 80 AM and 40 pm, outpatient cardiology advises torsemide 20 po qd - pt asks meds be sent to [facility] -Fair urine output, 1350 yesterday.    -Respiratory status not back to baseline but  improving. On 3 L currently from 5 yesterday, was discharged on O2 last hospitalization -Strict I's and O's and daily weights, is net negative -Continuous pulse oximetry maintain O2 saturation greater than 90% -Continue supplemental oxygen via nasal cannula and wean as tolerated -Will need an ambulatory home  O2 screen prior to discharge  Symptomatic Anemia Anemia of Chronic Kidney Disease -Hemoglobin down to 7.0 on admission.   -He has not noticed any significant bleeding in his stools or other sites.  He was transfused 2 units of PRBC since he was short of breath and had decompensated CHF.   -Follow-up hemoglobins stable in the 9s -Continue to monitor closely.  Stool guaiac negative  Paroxysmal Atrial Fibrillation -Currently in sinus rhythm.   -atient underwent ablation procedure in June and has been off of anticoagulation.  -Continue rate control appropriately.  Continue on amiodarone, carvedilol and diltiazem -Cardiology consulted for further evaluation recommendations and appreciate their management  CAD -This is stable at baseline.  -No complaints of chest pain at this time.  -Continue treatment with carvedilol 3.125 mg p.o. twice daily, and atorvastatin 40 g p.o. daily  Diabetes Mellitus Type 2  - controlled - Continue with moderate NovoLog sliding scale insulin AC and at bedtime  Septic arthritis of the Left Leg -Continue with his home ampicillin.  ID has consulted and has outpt recs -He will continue on IV antibiotics through 10/2.   -Plans to follow-up with orthopedics.  AKI on Chronic kidney disease stage IIIa - kidney function now back to baseline with IV diuresis  Essential Hypertension - moderately well controlled -C/w Carvedilol 3.125 mg po BID and Diltiazem 120 mg po Daily (reduced by cardiology -lasix as above -Continue blood pressure monitoring per Protocol   Leukocytosis -resolved  Morbid Obesity -Estimated body mass index is 40.43 kg/m as calculated from the following:   Height as of this encounter: 5\' 8"  (1.727 m).   Weight as of this encounter: 120.6 kg. -Weight Loss and Dietary Counseling   DVT prophylaxis: lovenox Code Status: FULL COE Family Communication: Discussed with Son at Bedside  Disposition Plan: home with home health  Status  is: Inpatient  Remains inpatient appropriate because:Unsafe d/c plan, IV treatments appropriate due to intensity of illness or inability to take PO and Inpatient level of care appropriate due to severity of illness   Dispo: The patient is from: Home              Anticipated d/c is to: Home              Anticipated d/c date is: 1 day              Patient currently is not medically stable to d/c.  Consultants:   Cardiology, ID   Procedures: None  Antimicrobials:  Anti-infectives (From admission, onward)   Start     Dose/Rate Route Frequency Ordered Stop   02/05/20 1200  ampicillin (OMNIPEN) 2 g in sodium chloride 0.9 % 100 mL IVPB        2 g 300 mL/hr over 20 Minutes Intravenous Every 4 hours 02/05/20 0956 02/22/20 2359   02/03/20 1200  ampicillin (OMNIPEN) 2 g in sodium chloride 0.9 % 100 mL IVPB  Status:  Discontinued        2 g 300 mL/hr over 20 Minutes Intravenous Every 6 hours 02/03/20 1008 02/05/20 0956        Subjective: Seen and examined at bedside and thinks he says his breathing continues to improve but  still not back to baseline.  Denies any chest pain, lightheadedness or dizziness.  No nausea or vomiting.  No other concerns or complaints at this time. No bleeding. Appetite improving.  Objective: Vitals:   02/06/20 1923 02/07/20 0438 02/07/20 0732 02/07/20 1147  BP: (!) 143/56 (!) 159/67 (!) 180/71 (!) 148/51  Pulse: (!) 57 63 70 68  Resp: 19 19 17 17   Temp: 97.7 F (36.5 C) 98.2 F (36.8 C) 97.6 F (36.4 C) (!) 97.5 F (36.4 C)  TempSrc: Oral Oral Oral Oral  SpO2: 96% 94% 93% 90%  Weight:  120.6 kg    Height:        Intake/Output Summary (Last 24 hours) at 02/07/2020 1503 Last data filed at 02/07/2020 1330 Gross per 24 hour  Intake 1260 ml  Output 850 ml  Net 410 ml   Filed Weights   02/03/20 1930 02/05/20 0547 02/07/20 0438  Weight: 128 kg 122.7 kg 120.6 kg   Examination: Physical Exam:  Constitutional: WN/WD morbidly obese Caucasian male  currently in NAD and appears calm and comfortable Eyes: Lids and conjunctivae normal, sclerae anicteric  ENMT: External Ears, Nose appear normal. Grossly normal hearing.  Respiratory: Diminished to auscultation bilaterally with faint breath sounds, rales at bases.  Normal respiratory effort and patient is not tachypenic. No accessory muscle use.  Cardiovascular: RRR, no murmurs / rubs / gallops.  1+ lower extremity edema Abdomen: Soft, non-tender, distended secondary by habitus.  Bowel sounds positive.  GU: Deferred. Musculoskeletal: No clubbing / cyanosis of digits/nails.  Left leg is immobilized from his septic arthritis Skin: No rashes, lesions, ulcers on limited skin evaluation. No induration; Warm and dry.  Neurologic: symmetric movements  Psychiatric: Normal judgment and insight. Alert and oriented x 3. Normal mood and appropriate affect.   Data Reviewed: I have personally reviewed following labs and imaging studies  CBC: Recent Labs  Lab 02/03/20 1405 02/04/20 0535 02/05/20 0435 02/06/20 0635 02/07/20 0655  WBC 11.7* 10.0 8.5 8.5 9.2  NEUTROABS  --   --  6.8  --   --   HGB 9.4* 9.5* 9.2* 9.6* 9.8*  HCT 29.5* 30.2* 29.5* 30.7* 32.2*  MCV 87.8 90.7 90.5 90.0 91.5  PLT 270 238 204 214 211   Basic Metabolic Panel: Recent Labs  Lab 02/02/20 1918 02/02/20 1918 02/03/20 0353 02/04/20 0535 02/05/20 0435 02/06/20 0635 02/07/20 0655  NA 141  --   --  142 142 141 142  K 4.0  --   --  4.0 3.8 3.9 3.7  CL 98  --   --  98 97* 96* 96*  CO2 35*  --   --  36* 36* 34* 36*  GLUCOSE 230*  --   --  182* 190* 191* 166*  BUN 32*  --   --  27* 28* 25* 28*  CREATININE 1.57*   < > 1.59* 1.56* 1.36* 1.46* 1.47*  CALCIUM 8.5*  --   --  8.6* 8.2* 8.2* 8.3*  MG  --   --   --   --  1.9  --   --   PHOS  --   --   --   --  3.3  --   --    < > = values in this interval not displayed.   GFR: Estimated Creatinine Clearance: 52.3 mL/min (A) (by C-G formula based on SCr of 1.47 mg/dL  (H)). Liver Function Tests: Recent Labs  Lab 02/02/20 1918 02/05/20 0435  AST 18 22  ALT 22 27  ALKPHOS 96 101  BILITOT 0.7 0.8  PROT 5.5* 5.5*  ALBUMIN 2.4* 2.5*   No results for input(s): LIPASE, AMYLASE in the last 168 hours. No results for input(s): AMMONIA in the last 168 hours. Coagulation Profile: No results for input(s): INR, PROTIME in the last 168 hours. Cardiac Enzymes: No results for input(s): CKTOTAL, CKMB, CKMBINDEX, TROPONINI in the last 168 hours. BNP (last 3 results) No results for input(s): PROBNP in the last 8760 hours. HbA1C: No results for input(s): HGBA1C in the last 72 hours. CBG: Recent Labs  Lab 02/06/20 1210 02/06/20 1619 02/06/20 2117 02/07/20 0733 02/07/20 1148  GLUCAP 181* 255* 171* 142* 241*   Lipid Profile: No results for input(s): CHOL, HDL, LDLCALC, TRIG, CHOLHDL, LDLDIRECT in the last 72 hours. Thyroid Function Tests: No results for input(s): TSH, T4TOTAL, FREET4, T3FREE, THYROIDAB in the last 72 hours. Anemia Panel: No results for input(s): VITAMINB12, FOLATE, FERRITIN, TIBC, IRON, RETICCTPCT in the last 72 hours. Sepsis Labs: No results for input(s): PROCALCITON, LATICACIDVEN in the last 168 hours.  Recent Results (from the past 240 hour(s))  MRSA PCR Screening     Status: None   Collection Time: 01/29/20  8:17 PM   Specimen: Nasal Mucosa; Nasopharyngeal  Result Value Ref Range Status   MRSA by PCR NEGATIVE NEGATIVE Final    Comment:        The GeneXpert MRSA Assay (FDA approved for NASAL specimens only), is one component of a comprehensive MRSA colonization surveillance program. It is not intended to diagnose MRSA infection nor to guide or monitor treatment for MRSA infections. Performed at [facility], [address] [place], [place] [address]   SARS Coronavirus 2 by RT PCR (hospital order, performed in [facility] hospital lab) Nasopharyngeal Nasopharyngeal Swab     Status: None   Collection Time: 01/31/20  10:34 AM   Specimen: Nasopharyngeal Swab  Result Value Ref Range Status   SARS Coronavirus 2 NEGATIVE NEGATIVE Final    Comment: (NOTE) SARS-CoV-2 target nucleic acids are NOT DETECTED.  The SARS-CoV-2 RNA is generally detectable in upper and lower respiratory specimens during the acute phase of infection. The lowest concentration of SARS-CoV-2 viral copies this assay can detect is 250 copies / mL. A negative result does not preclude SARS-CoV-2 infection and should not be used as the sole basis for treatment or other patient management decisions.  A negative result may occur with improper specimen collection / handling, submission of specimen other than nasopharyngeal swab, presence of viral mutation(s) within the areas targeted by this assay, and inadequate number of viral copies (<250 copies / mL). A negative result must be combined with clinical observations, patient history, and epidemiological information.  Fact Sheet for Patients:   BoilerBrush.com.cy  Fact Sheet for Healthcare Providers: https://pope.com/  This test is not yet approved or  cleared by the [place] FDA and has been authorized for detection and/or diagnosis of SARS-CoV-2 by FDA under an Emergency Use Authorization (EUA).  This EUA will remain in effect (meaning this test can be used) for the duration of the COVID-19 declaration under Section 564(b)(1) of the Act, 21 U.S.C. section 360bbb-3(b)(1), unless the authorization is terminated or revoked sooner.  Performed at [facility], [address] [place], [place] [address]   SARS Coronavirus 2 by RT PCR (hospital order, performed in [facility] hospital lab) Nasopharyngeal Nasopharyngeal Swab     Status: None   Collection Time: 02/02/20  7:18 PM  Specimen: Nasopharyngeal Swab  Result Value Ref Range Status   SARS Coronavirus 2 NEGATIVE NEGATIVE Final    Comment: (NOTE) SARS-CoV-2 target  nucleic acids are NOT DETECTED.  The SARS-CoV-2 RNA is generally detectable in upper and lower respiratory specimens during the acute phase of infection. The lowest concentration of SARS-CoV-2 viral copies this assay can detect is 250 copies / mL. A negative result does not preclude SARS-CoV-2 infection and should not be used as the sole basis for treatment or other patient management decisions.  A negative result may occur with improper specimen collection / handling, submission of specimen other than nasopharyngeal swab, presence of viral mutation(s) within the areas targeted by this assay, and inadequate number of viral copies (<250 copies / mL). A negative result must be combined with clinical observations, patient history, and epidemiological information.  Fact Sheet for Patients:   BoilerBrush.com.cyhttps://www.fda.gov/media/136312/download  Fact Sheet for Healthcare Providers: https://pope.com/https://www.fda.gov/media/136313/download  This test is not yet approved or  cleared by the [place] FDA and has been authorized for detection and/or diagnosis of SARS-CoV-2 by FDA under an Emergency Use Authorization (EUA).  This EUA will remain in effect (meaning this test can be used) for the duration of the COVID-19 declaration under Section 564(b)(1) of the Act, 21 U.S.C. section 360bbb-3(b)(1), unless the authorization is terminated or revoked sooner.  Performed at [facility], [address] [place], [place] [address]      RN Pressure Injury Documentation: Pressure Injury 01/29/20 Heel Left Stage 1 -  Intact skin with non-blanchable redness of a localized area usually over a bony prominence. due to knee mobilizer (Active)  01/29/20 0930  Location: Heel  Location Orientation: Left  Staging: Stage 1 -  Intact skin with non-blanchable redness of a localized area usually over a bony prominence.  Wound Description (Comments): due to knee mobilizer  Present on Admission: Yes     Estimated body  mass index is 40.43 kg/m as calculated from the following:   Height as of this encounter: 5\' 8"  (1.727 m).   Weight as of this encounter: 120.6 kg.  Malnutrition Type:      Malnutrition Characteristics:      Nutrition Interventions:    Radiology Studies: No results found. Scheduled Meds: . amiodarone  200 mg Oral Daily  . atorvastatin  40 mg Oral QPM  . carvedilol  3.125 mg Oral BID WC  . diltiazem  120 mg Oral Daily  . enoxaparin (LOVENOX) injection  40 mg Subcutaneous Q12H  . furosemide  40 mg Intravenous Q1500  . furosemide  80 mg Intravenous Daily  . insulin aspart  0-15 Units Subcutaneous TID WC  . insulin aspart  0-5 Units Subcutaneous QHS  . sodium chloride flush  3 mL Intravenous Q12H  . spironolactone  25 mg Oral Daily   Continuous Infusions: . sodium chloride 250 mL (02/04/20 0900)  . sodium chloride    . ampicillin (OMNIPEN) IV 2 g (02/07/20 1338)    LOS: 4 days   Silvano BilisNoah B Chandria Rookstool, MD [facility] PAGER is on AMION  If 7PM-7AM, please contact night-coverage [email or web address]

## 2020-02-07 NOTE — Progress Notes (Signed)
Physical Therapy Treatment Patient Details Name: Randall Vincent MRN: [id number] DOB: [birth year] Today's Date: 02/07/2020    History of Present Illness Randall Vincent is a 79 y.o. male with history of atrial fibrillation, diabetes, hypertension, CHF who presents with complaints of increased short of breath and fluid buildup.  Patient was recently discharged from the hospital, review of records demonstrates discharged yesterday after significant diuresis.  Patient reports that he was doing okay but started feeling increased shortness of breath over the last 24 hours with increased work of breathing.  Denies fevers chills or cough.  Is concerned that he needs further diuresis.  Also recently treated for septic arthritis of the left knee.    PT Comments    Pt seated upon BSC upon arrival to room with son present. Pt reported needing +2 assistance with thrid person for device/chair placement. Prior to standing, pt able to correctly state weightbearing precautions on LLE. Pt assisted to standing with min+2 A for boosting hips from [facility] and required min+2 A for static standing balance while pericare performed. Pt instructed on remaining TDWB on LLE during all standing activities. Pt then ambulated 2 feet using RW with min+2 A (for line management) and appeared to maintain no more than 20% wb'ing on LLE. Increased verbal cues for sequencing of tasks. Deferred further ambulation distance at this time secondary to pt current level of fatigue and feel pt would have difficulty maintaining TDWB for prolonged ambulation distance. Pt required min A for eccentric control on descent to recliner chair. Pt performed BLE therex for promotion of muscle strengthening for carryover to improved functional mobility. At this point, continue to recommend STR at discharge secondary to current level of functional mobility. Pt remained on 3L O2 via nasal cannula and knee immobilizer donned throughout session.     Follow Up  Recommendations  SNF;Other (comment);Home health PT;Supervision for mobility/OOB (HHPT if pt refuses SNF)     Equipment Recommendations  Other (comment) (TBD at next facility)    Recommendations for Other Services       Precautions / Restrictions Precautions Precautions: Fall Precaution Comments: High Fall Risk; R PICC Required Braces or Orthoses: Knee Immobilizer - Left Knee Immobilizer - Left: On at all times Restrictions Weight Bearing Restrictions: Yes LLE Weight Bearing: Touchdown weight bearing LLE Partial Weight Bearing Percentage or Pounds: 20    Mobility  Bed Mobility               General bed mobility comments: not performed as pt sitting on BSC upon arrival to room  Transfers Overall transfer level: Needs assistance Equipment used: Rolling walker (2 wheeled) Transfers: Sit to/from Stand Sit to Stand: Min assist;+2 physical assistance         General transfer comment: Min+2 A for sit to stand from [facility] for boost into standing and balance. Min A for eccentric control on descent.  Ambulation/Gait Ambulation/Gait assistance: Min assist;+2 safety/equipment Gait Distance (Feet): 2 Feet Assistive device: Rolling walker (2 wheeled) Gait Pattern/deviations: Step-to pattern;Trunk flexed;Decreased stride length;Shuffle Gait velocity: decreased   General Gait Details: Pt required verbal cues for sequencing of RW and stepping to allow BUE to offset weight to maintain TDWB on LLE. Pt with decreased step-to pattern.   Stairs             Wheelchair Mobility    Modified Rankin (Stroke Patients Only)       Balance Overall balance assessment: Needs assistance Sitting-balance support: Feet supported;No upper extremity supported Sitting balance-Leahy Scale: Good Sitting  balance - Comments: steady sitting reaching within BOS   Standing balance support: Bilateral upper extremity supported Standing balance-Leahy Scale: Fair Standing balance comment: +2  min A for steadying while pericare performed; pt utilized RW with BUE to maintain TDWB on LLE                            Cognition Arousal/Alertness: Awake/alert Behavior During Therapy: WFL for tasks assessed/performed Overall Cognitive Status: Within Functional Limits for tasks assessed                                        Exercises Other Exercises Other Exercises: RLE: hip ab/add, quat sets with 5 second hold, and heels slides x 10, SLR x 5; LLE: min A for elevating knee immobilizer hip ab/add and SLR x 10    General Comments        Pertinent Vitals/Pain Pain Assessment: No/denies pain    Home Living                      Prior Function            PT Goals (current goals can now be found in the care plan section) Acute Rehab PT Goals Patient Stated Goal: To go home with son. PT Goal Formulation: With patient/family Time For Goal Achievement: 02/19/20 Potential to Achieve Goals: Fair Progress towards PT goals: Progressing toward goals    Frequency    Min 2X/week      PT Plan Current plan remains appropriate    Co-evaluation              AM-PAC PT "6 Clicks" Mobility   Outcome Measure  Help needed turning from your back to your side while in a flat bed without using bedrails?: A Little Help needed moving from lying on your back to sitting on the side of a flat bed without using bedrails?: A Little Help needed moving to and from a bed to a chair (including a wheelchair)?: A Little Help needed standing up from a chair using your arms (e.g., wheelchair or bedside chair)?: A Little Help needed to walk in hospital room?: A Lot Help needed climbing 3-5 steps with a railing? : A Lot 6 Click Score: 16    End of Session Equipment Utilized During Treatment: Gait belt;Oxygen;Other (comment) (3L O2 via nasal cannula) Activity Tolerance: Patient tolerated treatment well Patient left: in chair;with call bell/phone within  reach;with chair alarm set;with family/visitor present Nurse Communication: Mobility status;Precautions;Weight bearing status PT Visit Diagnosis: Other abnormalities of gait and mobility (R26.89);Muscle weakness (generalized) (M62.81);Difficulty in walking, not elsewhere classified (R26.2);Pain     Time: 1010-1040 PT Time Calculation (min) (ACUTE ONLY): 30 min  Charges:                       Frederich Chick, SPT   Frederich Chick 02/07/2020, 12:53 PM

## 2020-02-07 NOTE — Treatment Plan (Signed)
Diagnosis: Enterococus PJI left knee Baseline Creatinine 1.47   No Known Allergies  OPAT Orders Discharge antibiotics:penicilln 24 million units as a continuous infusion every 24 hours End Date: 02/22/20  [facility] Care Per Protocol:  Labs weekly while on IV antibiotics: _X_ CBC with differential X CMP  _X_ ESR   _X_ Please pull PIC at completion of IV antibiotics _  Fax weekly labs to [place]  [phone]  Clinic Follow Up Appt: 02/18/20 at 11.30 am   Call [phone] with any questions or critical value

## 2020-02-07 NOTE — Progress Notes (Addendum)
   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  On admission he complained of increasing SOB, lower extremity edema and orthopnea.  CXR in ED revealed bilateral pulmonary edema.  BNP 1.387.  He was also noted to be anemic with hemoglobin of 7.0.   Comorbidities:  Morbid obesity Atrial fibrillation Diabetes CKD stage III Obesity Hypertension Anemia  Medications:  Amiodarone 200 mg daily Lipitor 40 mg daily Coreg 3.125 mg BID Cardizem CD 120 mg daily Lasix 40 mg IV daily Spironolactone 25 mg daily  On last admission he was discharged on Ramipril 10 mg daily.  Labs:  Sodium 142, potassium 3.7, BUN 28 ,(yesterday 25) creatinine 1.47 (yesterday 1.46)  Weight-120.6 kg (previous day 122.7)  Intake 1447 ml Output 1350 ml  Today he is sitting up in the chair with legs elevated.  He states that he is to about 90% of him feeling normal.  He is anxious to be going home.  Still on 02 per nasal cannula.  Discussed with son, ways to weigh him safely with using walker.  Stressed the importance of the daily weights.  Daughter in-law that patient will be living with, in room discussed 2 gram sodium diet,2 liter fluid restriction, also discussed using a 2 liter bottle to fill with water and each time he drinks a liquid remove that amount from the bottle. And again stressed the importance of weighing daily.  Tresa Endo RN, CHFN

## 2020-02-08 LAB — CBC
HCT: 30.6 % — ABNORMAL LOW (ref 39.0–52.0)
Hemoglobin: 9.3 g/dL — ABNORMAL LOW (ref 13.0–17.0)
MCH: 27.6 pg (ref 26.0–34.0)
MCHC: 30.4 g/dL (ref 30.0–36.0)
MCV: 90.8 fL (ref 80.0–100.0)
Platelets: 171 10*3/uL (ref 150–400)
RBC: 3.37 MIL/uL — ABNORMAL LOW (ref 4.22–5.81)
RDW: 17.3 % — ABNORMAL HIGH (ref 11.5–15.5)
WBC: 7.4 10*3/uL (ref 4.0–10.5)
nRBC: 0 % (ref 0.0–0.2)

## 2020-02-08 LAB — BASIC METABOLIC PANEL
Anion gap: 7 (ref 5–15)
BUN: 24 mg/dL — ABNORMAL HIGH (ref 8–23)
CO2: 36 mmol/L — ABNORMAL HIGH (ref 22–32)
Calcium: 8.2 mg/dL — ABNORMAL LOW (ref 8.9–10.3)
Chloride: 98 mmol/L (ref 98–111)
Creatinine, Ser: 1.47 mg/dL — ABNORMAL HIGH (ref 0.61–1.24)
GFR calc Af Amer: 52 mL/min — ABNORMAL LOW (ref >60)
GFR calc non Af Amer: 45 mL/min — ABNORMAL LOW (ref >60)
Glucose, Bld: 190 mg/dL — ABNORMAL HIGH (ref 70–99)
Potassium: 3.8 mmol/L (ref 3.5–5.1)
Sodium: 141 mmol/L (ref 135–145)

## 2020-02-08 LAB — GLUCOSE, CAPILLARY
Glucose-Capillary: 237 mg/dL — ABNORMAL HIGH (ref 70–99)
Glucose-Capillary: 202 mg/dL — ABNORMAL HIGH (ref 70–99)
Glucose-Capillary: 148 mg/dL — ABNORMAL HIGH (ref 70–99)
Glucose-Capillary: 167 mg/dL — ABNORMAL HIGH (ref 70–99)

## 2020-02-08 MED ORDER — FUROSEMIDE 40 MG PO TABS: 40.0000 mg | ORAL_TABLET | Freq: Two times a day (BID) | ORAL | Status: DC

## 2020-02-08 MED ORDER — TORSEMIDE 20 MG PO TABS: 20.0000 mg | ORAL_TABLET | Freq: Two times a day (BID) | ORAL | Status: DC

## 2020-02-08 MED ORDER — TORSEMIDE 20 MG PO TABS
40.0000 mg | ORAL_TABLET | Freq: Two times a day (BID) | ORAL | Status: DC
  Administered 2020-02-08 – 2020-02-09 (×2): 40 mg via ORAL
  Filled 2020-02-08 (×2): qty 2

## 2020-02-08 NOTE — TOC Transition Note (Addendum)
Transition of Care [facility]) - CM/SW Discharge Note   Patient Details  Name: Jackson Coffield MRN: [id number] Date of Birth: Mar 30, 1941  [facility] of [facility]) CM/SW Contact:  Eilleen Kempf, LCSW Phone Number: 02/08/2020, 4:43 PM   Clinical Narrative:    Patient is discharging home with son and daughter-inlaw. DME delivered by [facility] with [employer].  Patient is also recommended for HH/PT, CSW has called all agencies and none are available to accept this patient.   Final next level of care: Home w Home Health Services Barriers to Discharge: Continued Medical Work up   Patient Goals and CMS Choice Patient states their goals for this hospitalization and ongoing recovery are:: to go home with son to rehab CMS Medicare.gov Compare Post Acute Care list provided to:: Patient Choice offered to / list presented to : Patient  Discharge Placement                       Discharge Plan and Services     Post Acute Care Choice: Home Health              Date DME Agency Contacted: 02/07/20 Time DME Agency Contacted: [phone] Representative spoke with at DME Agency: Vaughan Basta            Social Determinants of Health (SDOH) Interventions     Readmission Risk Interventions No flowsheet data found.

## 2020-02-08 NOTE — Progress Notes (Signed)
PROGRESS NOTE    Randall Vincent  TIR:[id number] DOB: [birth year] DOA: 02/02/2020 PCP: Elspeth Cho., MD   Brief Narrative:  HPI per Dr. Earlie Lou on 02/02/20 Randall Vincent is a 79 y.o. 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  He had left septic arthritis and was on IV antibiotics followed by orthopedics during 2 previous hospitalizations.  Patient complained of worsening shortness of breath and swelling.  No fever or chills no nausea vomiting or diarrhea.  At discharge patient was transitioned from IV Lasix to oral Lasix 40 mg daily.  He had an echo done in August showing EF of 50 to 55%.  His weight was getting better.  Given incentive spirometer and to follow-up with his cardiologist over at [facility].  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  He had surgery in August where prosthesis was removed and antibiotic spacer was placed.  He was to stay off anticoagulation again except for aspirin due to his recent GI bleed.  Patient is now back with increasing his weight worsening shortness of breath and found to have bilateral pulmonary edema.  Is being readmitted to the hospital for possible treatment.  Additionally patient was found to have possible infiltrates bilaterally.  Repeat COVID-19 so far is negative.  Is being admitted for further treatment..  ED Course: Temperature is 98 blood pressure 150/52 pulse 50 respirate of 31 oxygen sat 91% on room air.  White count 8.6 hemoglobin 7.1 platelets 251 sodium 142 potassium 4.2 chloride 90 CO2 35 BUN 30 creatinine 1.57 calcium 8.5.  Glucose is 230.  BNP of 1387.   Chest x-ray showed bilateral pleural effusion and pulmonary edema.  Patient be readmitted for further treatment  **Interim History Cardiology was consulted given his recurrent CHF exacerbation recommending continue IV Lasix and chronic amiodarone control on carvedilol as well as diltiazem for rate control of his atrial fibrillation.  Currently not anticoagulated due to his new recent anemia and concern for possible GI bleed.[name]  He was given 2 more units this admission. H/H stable 9/15. Admission continued for continued IV diuresis  Assessment & Plan:   Principal Problem:   Acute exacerbation of CHF (congestive heart failure) (HCC) Active Problems:   CAD, multiple vessel   Atrial fibrillation status post cardioversion 10/22/19 [facility])   Essential hypertension   DM (diabetes mellitus), type 2 (HCC)   Symptomatic anemia   Suspect Septic arthritis (HCC)   Chronic kidney disease, stage 3a   A-fib (HCC)   CHF exacerbation (HCC)  Acute Respiratory Failure with Hypoxia in the setting of Acute on Chronic Diastolic CHF Acute exacerbation of congestive heart failure -Patient has diastolic dysfunction.    -BNP on Admission was 1,387.0 -CXR showed "Interstitial and airspace opacities diffusely likely reflect edema/CHF. More confluent opacity at the left lung base could reflect pneumonia. Small effusions.." -He was treated with IV furosemide, diuretics now transitioned to torsemide 20 mg po bid in preparation for discharge - pt asks meds be sent to [facility] -Fair urine output, 625yesterday.    -Respiratory status not back to baseline but improving. On 3 L currently, was discharged on O2 last hospitalization -Strict  I's and O's and daily weights, is net negative -Continuous pulse oximetry maintain O2 saturation greater than 90% -Continue supplemental oxygen via nasal cannula and wean as tolerated -Will need an ambulatory home O2 screen prior to discharge  Symptomatic Anemia Anemia of  Chronic Kidney Disease -Hemoglobin down to 7.0 on admission.   -He has not noticed any significant bleeding in his stools or other sites.  He was transfused 2 units of PRBC since he was short of breath and had decompensated CHF.   -Follow-up hemoglobins stable in the 9s -Continue to monitor closely.  Stool guaiac negative  Paroxysmal Atrial Fibrillation -Currently in sinus rhythm.   -atient underwent ablation procedure in June and has been off of anticoagulation.  -Continue rate control appropriately.  Continue on amiodarone, carvedilol and diltiazem -Cardiology consulted for further evaluation recommendations and appreciate their management  CAD -This is stable at baseline.  -No complaints of chest pain at this time.  -Continue treatment with carvedilol 3.125 mg p.o. twice daily, and atorvastatin 40 g p.o. daily  Diabetes Mellitus Type 2  - controlled - Continue with moderate NovoLog sliding scale insulin AC and at bedtime  Septic arthritis of the Left Leg -Continue with his home ampicillin.  ID has consulted and has outpt recs -He will continue on IV antibiotics through 10/2.   -Plans to follow-up with orthopedics.  AKI on Chronic kidney disease stage IIIa - kidney function now back to baseline with IV diuresis  Essential Hypertension - moderately well controlled -C/w Carvedilol 3.125 mg po BID and Diltiazem 120 mg po Daily (reduced by cardiology) -diuretics as above  Morbid Obesity -Estimated body mass index is 40.95 kg/m as calculated from the following:   Height as of this encounter: 5\' 8"  (1.727 m).   Weight as of this encounter: 122.2 kg.  DVT prophylaxis: lovenox Code Status: FULL COE Family Communication: Discussed with Son at Bedside  Disposition Plan: home with home health  Status is: Inpatient  Remains inpatient appropriate because:optimizing diuretics   Dispo: The patient is from: Home              Anticipated d/c is to: Home               Anticipated d/c date is: today or tomorrow              Patient currently is not medically stable to d/c.  Consultants:   Cardiology, ID   Procedures: None  Antimicrobials:  Anti-infectives (From admission, onward)   Start     Dose/Rate Route Frequency Ordered Stop   02/05/20 1200  ampicillin (OMNIPEN) 2 g in sodium chloride 0.9 % 100 mL IVPB        2 g 300 mL/hr over 20 Minutes Intravenous Every 4 hours 02/05/20 0956 02/22/20 2359   02/03/20 1200  ampicillin (OMNIPEN) 2 g in sodium chloride 0.9 % 100 mL IVPB  Status:  Discontinued        2 g 300 mL/hr over 20 Minutes Intravenous Every 6 hours 02/03/20 1008 02/05/20 0956        Subjective: Seen and examined at bedside and thinks he says his breathing continues to improve but still not back to baseline.  Denies any chest pain, lightheadedness or dizziness.  No nausea or vomiting.  No other concerns or complaints at this time. Appetite is good. Remains SOB when moves about, unchanged.  Objective: Vitals:   02/07/20 1949 02/08/20 0413 02/08/20 0848 02/08/20 1204  BP: (!) 149/55 (!) 167/62 [name](!)  166/64 (!) 159/62  Pulse: (!) 59 65 69 (!) 58  Resp: Temp: 97.7 F (36.5 C) 97.7 F (36.5 C) (!) 97.5 F (36.4 C) 97.6 F (36.4 C)  TempSrc: Oral Oral Oral Oral  SpO2: 98% 98% 98% 96%  Weight:  122.2 kg    Height:        Intake/Output Summary (Last 24 hours) at 02/08/2020 1244 Last data filed at 02/08/2020 0800 Gross per 24 hour  Intake 720 ml  Output 1025 ml  Net -305 ml   Filed Weights   02/05/20 0547 02/07/20 0438 02/08/20 0413  Weight: 122.7 kg 120.6 kg 122.2 kg   Examination: Physical Exam:  Constitutional: WN/WD morbidly obese Caucasian male currently in NAD and appears calm and comfortable Eyes: Lids and conjunctivae normal, sclerae anicteric  ENMT: External Ears, Nose appear normal. Grossly normal hearing.  Respiratory: Diminished to auscultation bilaterally with faint breath sounds, rales at bases.   Normal respiratory effort and patient is not tachypenic. No accessory muscle use.  Cardiovascular: RRR, no murmurs / rubs / gallops.  1+ lower extremity edema Abdomen: Soft, non-tender, distended secondary by habitus.  Bowel sounds positive.  GU: Deferred. Musculoskeletal: No clubbing / cyanosis of digits/nails.  Left leg is immobilized from his septic arthritis Skin: hyperpigmentation LEs, No induration; Warm and dry.  Neurologic: symmetric movements  Psychiatric: Normal judgment and insight. Alert and oriented x 3. Normal mood and appropriate affect.   Data Reviewed: I have personally reviewed following labs and imaging studies  CBC: Recent Labs  Lab 02/04/20 0535 02/05/20 0435 02/06/20 0635 02/07/20 0655 02/08/20 0645  WBC 10.0 8.5 8.5 9.2 7.4  NEUTROABS  --  6.8  --   --   --   HGB 9.5* 9.2* 9.6* 9.8* 9.3*  HCT 30.2* 29.5* 30.7* 32.2* 30.6*  MCV 90.7 90.5 90.0 91.5 90.8  PLT 238 204 214 211 171   Basic Metabolic Panel: Recent Labs  Lab 02/04/20 0535 02/05/20 0435 02/06/20 0635 02/07/20 0655 02/08/20 0645  NA 142 142 141 142 141  K 4.0 3.8 3.9 3.7 3.8  CL 98 97* 96* 96* 98  CO2 36* 36* 34* 36* 36*  GLUCOSE 182* 190* 191* 166* 190*  BUN 27* 28* 25* 28* 24*  CREATININE 1.56* 1.36* 1.46* 1.47* 1.47*  CALCIUM 8.6* 8.2* 8.2* 8.3* 8.2*  MG  --  1.9  --   --   --   PHOS  --  3.3  --   --   --    GFR: Estimated Creatinine Clearance: 52.7 mL/min (A) (by C-G formula based on SCr of 1.47 mg/dL (H)). Liver Function Tests: Recent Labs  Lab 02/02/20 1918 02/05/20 0435  AST 18 22  ALT 22 27  ALKPHOS 96 101  BILITOT 0.7 0.8  PROT 5.5* 5.5*  ALBUMIN 2.4* 2.5*   No results for input(s): LIPASE, AMYLASE in the last 168 hours. No results for input(s): AMMONIA in the last 168 hours. Coagulation Profile: No results for input(s): INR, PROTIME in the last 168 hours. Cardiac Enzymes: No results for input(s): CKTOTAL, CKMB, CKMBINDEX, TROPONINI in the last 168 hours. BNP  (last 3 results) No results for input(s): PROBNP in the last 8760 hours. HbA1C: No results for input(s): HGBA1C in the last 72 hours. CBG: Recent Labs  Lab 02/07/20 1148 02/07/20 1629 02/07/20 2111 02/08/20 0848 02/08/20 1206  GLUCAP 241* 181* 198* 237* 202*   Lipid Profile: No results for input(s): CHOL, HDL, LDLCALC, TRIG, CHOLHDL,  LDLDIRECT in the last 72 hours. Thyroid Function Tests: No results for input(s): TSH, T4TOTAL, FREET4, T3FREE, THYROIDAB in the last 72 hours. Anemia Panel: No results for input(s): VITAMINB12, FOLATE, FERRITIN, TIBC, IRON, RETICCTPCT in the last 72 hours. Sepsis Labs: No results for input(s): PROCALCITON, LATICACIDVEN in the last 168 hours.  Recent Results (from the past 240 hour(s))  MRSA PCR Screening     Status: None   Collection Time: 01/29/20  8:17 PM   Specimen: Nasal Mucosa; Nasopharyngeal  Result Value Ref Range Status   MRSA by PCR NEGATIVE NEGATIVE Final    Comment:        The GeneXpert MRSA Assay (FDA approved for NASAL specimens only), is one component of a comprehensive MRSA colonization surveillance program. It is not intended to diagnose MRSA infection nor to guide or monitor treatment for MRSA infections. Performed at [facility], [address] [place], [place] [address]   SARS Coronavirus 2 by RT PCR (hospital order, performed in [facility] hospital lab) Nasopharyngeal Nasopharyngeal Swab     Status: None   Collection Time: 01/31/20 10:34 AM   Specimen: Nasopharyngeal Swab  Result Value Ref Range Status   SARS Coronavirus 2 NEGATIVE NEGATIVE Final    Comment: (NOTE) SARS-CoV-2 target nucleic acids are NOT DETECTED.  The SARS-CoV-2 RNA is generally detectable in upper and lower respiratory specimens during the acute phase of infection. The lowest concentration of SARS-CoV-2 viral copies this assay can detect is 250 copies / mL. A negative result does not preclude SARS-CoV-2 infection and should not be  used as the sole basis for treatment or other patient management decisions.  A negative result may occur with improper specimen collection / handling, submission of specimen other than nasopharyngeal swab, presence of viral mutation(s) within the areas targeted by this assay, and inadequate number of viral copies (<250 copies / mL). A negative result must be combined with clinical observations, patient history, and epidemiological information.  Fact Sheet for Patients:   BoilerBrush.com.cy  Fact Sheet for Healthcare Providers: https://pope.com/  This test is not yet approved or  cleared by the [place] FDA and has been authorized for detection and/or diagnosis of SARS-CoV-2 by FDA under an Emergency Use Authorization (EUA).  This EUA will remain in effect (meaning this test can be used) for the duration of the COVID-19 declaration under Section 564(b)(1) of the Act, 21 U.S.C. section 360bbb-3(b)(1), unless the authorization is terminated or revoked sooner.  Performed at [facility], [address] [place], [place] [address]   SARS Coronavirus 2 by RT PCR (hospital order, performed in [facility] hospital lab) Nasopharyngeal Nasopharyngeal Swab     Status: None   Collection Time: 02/02/20  7:18 PM   Specimen: Nasopharyngeal Swab  Result Value Ref Range Status   SARS Coronavirus 2 NEGATIVE NEGATIVE Final    Comment: (NOTE) SARS-CoV-2 target nucleic acids are NOT DETECTED.  The SARS-CoV-2 RNA is generally detectable in upper and lower respiratory specimens during the acute phase of infection. The lowest concentration of SARS-CoV-2 viral copies this assay can detect is 250 copies / mL. A negative result does not preclude SARS-CoV-2 infection and should not be used as the sole basis for treatment or other patient management decisions.  A negative result may occur with improper specimen collection / handling, submission  of specimen other than nasopharyngeal swab, presence of viral mutation(s) within the areas targeted by this assay, and inadequate number of viral copies (<250 copies / mL). A  negative result must be combined with clinical observations, patient history, and epidemiological information.  Fact Sheet for Patients:   BoilerBrush.com.cy  Fact Sheet for Healthcare Providers: https://pope.com/  This test is not yet approved or  cleared by the [place] FDA and has been authorized for detection and/or diagnosis of SARS-CoV-2 by FDA under an Emergency Use Authorization (EUA).  This EUA will remain in effect (meaning this test can be used) for the duration of the COVID-19 declaration under Section 564(b)(1) of the Act, 21 U.S.C. section 360bbb-3(b)(1), unless the authorization is terminated or revoked sooner.  Performed at [facility], [address] [place], [place] [address]      RN Pressure Injury Documentation: Pressure Injury 01/29/20 Heel Left Stage 1 -  Intact skin with non-blanchable redness of a localized area usually over a bony prominence. due to knee mobilizer (Active)  01/29/20 0930  Location: Heel  Location Orientation: Left  Staging: Stage 1 -  Intact skin with non-blanchable redness of a localized area usually over a bony prominence.  Wound Description (Comments): due to knee mobilizer  Present on Admission: Yes     Estimated body mass index is 40.95 kg/m as calculated from the following:   Height as of this encounter: 5\' 8"  (1.727 m).   Weight as of this encounter: 122.2 kg.  Malnutrition Type:      Malnutrition Characteristics:      Nutrition Interventions:    Radiology Studies: No results found. Scheduled Meds: . amiodarone  200 mg Oral Daily  . atorvastatin  40 mg Oral QPM  . carvedilol  3.125 mg Oral BID WC  . diltiazem  120 mg Oral Daily  . enoxaparin (LOVENOX) injection  40 mg  Subcutaneous Q12H  . insulin aspart  0-15 Units Subcutaneous TID WC  . insulin aspart  0-5 Units Subcutaneous QHS  . sodium chloride flush  3 mL Intravenous Q12H  . spironolactone  25 mg Oral Daily  . torsemide  20 mg Oral BID   Continuous Infusions: . sodium chloride 250 mL (02/04/20 0900)  . sodium chloride    . ampicillin (OMNIPEN) IV 2 g (02/08/20 0912)    LOS: 5 days   02/10/20, MD [facility] PAGER is on AMION  If 7PM-7AM, please contact night-coverage [email or web address]

## 2020-02-08 NOTE — Progress Notes (Signed)
Patient Name: Randall HaysGeorge Vincent Date of Encounter: 02/08/2020  Hospital Problem List     Principal Problem:   Acute exacerbation of CHF (congestive heart failure) (HCC) Active Problems:   CAD, multiple vessel   Atrial fibrillation status post cardioversion 10/22/19 [facility](HCC)   Essential hypertension   DM (diabetes mellitus), type 2 (HCC)   Symptomatic anemia   Suspect Septic arthritis (HCC)   Chronic kidney disease, stage 3a   A-fib (HCC)   CHF exacerbation (HCC)    Patient Profile    79 y.o.malewith medical history significant ofcoronary artery disease status post carotid bypass graft, diabetes type 2 with chronic kidney disease stage III, atrial fibrillation status post cardioversion in June this year previously on Pradaxa, no history of GI bleed, hypertension among other things who was just discharged yesterday after admission from September 6 with congestive heart failure as well as acute kidney injury.He is being treated for chf.  Subjective    Patient laying in bed, reports feeling somewhat better with improved breathing  Inpatient Medications    . amiodarone  200 mg Oral Daily  . atorvastatin  40 mg Oral QPM  . carvedilol  3.125 mg Oral BID WC  . diltiazem  120 mg Oral Daily  . enoxaparin (LOVENOX) injection  40 mg Subcutaneous Q12H  . furosemide  40 mg Intravenous Q1500  . furosemide  80 mg Intravenous Daily  . insulin aspart  0-15 Units Subcutaneous TID WC  . insulin aspart  0-5 Units Subcutaneous QHS  . sodium chloride flush  3 mL Intravenous Q12H  . spironolactone  25 mg Oral Daily    Vital Signs    Vitals:   02/07/20 1658 02/07/20 1949 02/08/20 0413 02/08/20 0848  BP: (!) 188/61 (!) 149/55 (!) 167/62 (!) 166/64  Pulse: 65 (!) 59 65 69  Resp: 17 20 18 19   Temp: 97.9 F (36.6 C) 97.7 F (36.5 C) 97.7 F (36.5 C) (!) 97.5 F (36.4 C)  TempSrc:  Oral Oral Oral  SpO2: 100% 98% 98% 98%  Weight:   122.2 kg   Height:        Intake/Output Summary (Last 24  hours) at 02/08/2020 1133 Last data filed at 02/08/2020 0800 Gross per 24 hour  Intake 720 ml  Output 1025 ml  Net -305 ml   Filed Weights   02/05/20 0547 02/07/20 0438 02/08/20 0413  Weight: 122.7 kg 120.6 kg 122.2 kg    Physical Exam    GEN: Well nourished, well developed, in no acute distress.  HEENT: normal.  Neck: Supple, no JVD, carotid bruits, or masses. Cardiac: RRR, no murmurs, rubs, or gallops. No clubbing, cyanosis, edema.  Radials/DP/PT 2+ and equal bilaterally.  Respiratory:  Respirations regular and unlabored, clear to auscultation bilaterally. GI: Soft, nontender, nondistended, BS + x 4. MS: no deformity or atrophy. Skin: warm and dry, no rash. Neuro:  Strength and sensation are intact. Psych: Normal affect.  Labs    CBC Recent Labs    02/07/20 0655 02/08/20 0645  WBC 9.2 7.4  HGB 9.8* 9.3*  HCT 32.2* 30.6*  MCV 91.5 90.8  PLT 211 171   Basic Metabolic Panel Recent Labs    96/08/5407/17/21 0655 02/08/20 0645  NA 142 141  K 3.7 3.8  CL 96* 98  CO2 36* 36*  GLUCOSE 166* 190*  BUN 28* 24*  CREATININE 1.47* 1.47*  CALCIUM 8.3* 8.2*   Liver Function Tests No results for input(s): AST, ALT, ALKPHOS, BILITOT, PROT, ALBUMIN in the  last 72 hours. No results for input(s): LIPASE, AMYLASE in the last 72 hours. Cardiac Enzymes No results for input(s): CKTOTAL, CKMB, CKMBINDEX, TROPONINI in the last 72 hours. BNP No results for input(s): BNP in the last 72 hours. D-Dimer No results for input(s): DDIMER in the last 72 hours. Hemoglobin A1C No results for input(s): HGBA1C in the last 72 hours. Fasting Lipid Panel No results for input(s): CHOL, HDL, LDLCALC, TRIG, CHOLHDL, LDLDIRECT in the last 72 hours. Thyroid Function Tests No results for input(s): TSH, T4TOTAL, T3FREE, THYROIDAB in the last 72 hours.  Invalid input(s): FREET3  Telemetry    nsr  ECG    nsr  Radiology    DG Chest 1 View  Result Date: 02/05/2020 CLINICAL DATA:  Shortness of  breath EXAM: CHEST  1 VIEW COMPARISON:  02/04/2020, 02/02/2020, 01/27/2020 FINDINGS: Post sternotomy changes. Right upper extremity central venous catheter tip projects over the brachiocephalic region. Cardiomegaly with vascular congestion and diffuse bilateral interstitial and ground-glass opacity most likely reflecting pulmonary edema. Bilateral effusions and basilar consolidation without change. Aortic atherosclerosis. No pneumothorax. IMPRESSION: No significant interval change in appearance of the chest with cardiomegaly, vascular congestion, pulmonary edema and bilateral effusions with basilar consolidation. Electronically Signed   By: Jasmine Pang M.D.   On: 02/05/2020 02:12   DG Chest 1 View  Result Date: 02/04/2020 CLINICAL DATA:  Shortness of breath EXAM: CHEST  1 VIEW COMPARISON:  02/02/2020 FINDINGS: Cardiomegaly. Prior CABG. Diffuse interstitial and alveolar opacities throughout the lungs, similar to prior study. More consolidated appearance in the left lower lobe, similar to prior study. Some improvement in aeration at the right lung base. Small bilateral effusions. No acute bony abnormality. IMPRESSION: Interstitial and airspace opacities diffusely likely reflect edema/CHF. More confluent opacity at the left lung base could reflect pneumonia. Small effusions. Electronically Signed   By: Charlett Nose M.D.   On: 02/04/2020 10:41   DG Chest 2 View  Result Date: 01/10/2020 CLINICAL DATA:  Weakness EXAM: CHEST - 2 VIEW COMPARISON:  06/26/2019 FINDINGS: Hazy interstitial opacities with a mid to lower lung predominance with some slight vascular cephalization/congestion. No pneumothorax. No effusion. No focal consolidation. Cardiomegaly appearance is similar to priors with evidence of prior sternotomy and CABG and a calcified, tortuous aorta. Cardiomediastinal contours are otherwise unremarkable. No acute osseous or soft tissue abnormality. Degenerative changes are present in the imaged spine and  shoulders. IMPRESSION: Features of CHF/volume overload with cardiomegaly and findings suggesting mild interstitial pulmonary edema. Electronically Signed   By: Kreg Shropshire M.D.   On: 01/10/2020 19:19   DG Knee 1-2 Views Left  Result Date: 01/11/2020 CLINICAL DATA:  Bilateral knee replacements.  Pain and swelling. EXAM: LEFT KNEE - 1-2 VIEW COMPARISON:  None. FINDINGS: Left total knee arthroplasty without perihardware lucency. Intermediate sized suprapatellar joint effusion. No evidence of arthropathy or other focal bone abnormality. Soft tissues are unremarkable. IMPRESSION: 1. Intermediate sized suprapatellar joint effusion. 2. Uncomplicated left total knee arthroplasty. Electronically Signed   By: Deatra Robinson M.D.   On: 01/11/2020 04:34   DG Knee 1-2 Views Right  Result Date: 01/11/2020 CLINICAL DATA:  Knee replacements.  Swelling and pain. EXAM: RIGHT KNEE - 1-2 VIEW COMPARISON:  None. FINDINGS: There is a right total knee arthroplasty. No perihardware lucency. No evidence of arthropathy or other focal bone abnormality. Soft tissues are unremarkable. 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 Electronically Signed   By: Deatra Robinson M.D.   On: 01/11/2020 04:35  US RENAL  Result Date: 01/27/2020 CLINICAL DATA:  Acute kidney injury.  Chronic kidney disease. EXAM: RENAL / URINARY TRACT ULTRASOUND COMPLETE COMPARISON:  Abdomen and pelvis CT dated 09/05/2016. FINDINGS: Right Kidney: Renal measurements: 10.8 x 6.1 x 5.9 cm = volume: 201 mL. Echogenicity within normal limits. No mass or hydronephrosis visualized. Left Kidney: Renal measurements: 12.2 x 6.8 x 5.8 cm = volume: 228 mL. Echogenicity within normal limits. No mass or hydronephrosis visualized. Bladder: Not distended, with a Foley catheter in place. Other: None. IMPRESSION: Normal examination. Electronically Signed   By: Beckie Salts M.D.   On: 01/27/2020 17:15   DG Chest Port 1 View  Result Date:  02/02/2020 CLINICAL DATA:  Shortness of breath, cough and edema EXAM: PORTABLE CHEST 1 VIEW COMPARISON:  Radiograph 01/27/2020 FINDINGS: Right upper extremity central venous catheter tip terminates at the expected location of the right brachiocephalic-superior caval confluence. Unchanged in position from comparison. Telemetry leads and nasal cannula overlie the chest. Evidence of prior sternotomy and likely CABG with fracture of the second superior most sternal suture. Prominence of the cardiac silhouette likely reflecting some mild cardiomegaly though possibly accentuated by low volumes and portable technique. Increasing interstitial and patchy opacities, indistinct pulmonary vascularity as well as some septal thickening and bilateral pleural effusions. No pneumothorax. More consolidative opacity is present in the right lung base as well. No acute osseous or soft tissue abnormality. Degenerative changes are present in the imaged spine and shoulders. IMPRESSION: 1. Features compatible with CHF demonstrating worsening pulmonary edema and new small bilateral pleural effusions. 2. More consolidative opacity in the right lung base could reflect developing infection, alveolar edema, and/or atelectasis. 3. Right upper extremity central venous catheter tip terminates at the expected location of the right brachiocephalic-superior caval confluence. Electronically Signed   By: Kreg Shropshire M.D.   On: 02/02/2020 19:37   DG Chest Portable 1 View  Result Date: 01/27/2020 CLINICAL DATA:  Shortness of breath. History of diabetes, atrial fibrillation and hypertension. EXAM: PORTABLE CHEST 1 VIEW COMPARISON:  Radiographs 01/10/2020 and 06/26/2019. FINDINGS: 0814 hours. Two views obtained. There is stable cardiac enlargement post median sternotomy and CABG. There is aortic atherosclerosis. There is new mild pulmonary edema and probable small bilateral pleural effusions. No confluent airspace opacity or pneumothorax. Apparent right  arm PICC not visualized beyond the level of the right sternoclavicular joint, likely in the right brachiocephalic vein. No acute osseous findings. IMPRESSION: 1. New mild pulmonary edema and probable small bilateral pleural effusions consistent with congestive heart failure. 2. Apparent right arm PICC tip is likely in the right brachiocephalic vein. Electronically Signed   By: Carey Bullocks M.D.   On: 01/27/2020 08:24   ECHOCARDIOGRAM COMPLETE  Result Date: 01/11/2020    ECHOCARDIOGRAM REPORT   Patient Name:   ADMIR CANDELAS Date of Exam: 01/11/2020 Medical Rec #:  [id number]     Height:       70.0 in Accession #:    [id number]    Weight:       240.0 lb Date of Birth:  1940/11/17      BSA:          2.255 m Patient Age:    79 years      BP:           134/48 mmHg Patient Gender: M             HR:           74 bpm. Exam Location:  [facility]  Procedure: 2D Echo, Cardiac Doppler and Color Doppler Indications:     CHF- acute diastolic 428.31  History:         Patient has no prior history of Echocardiogram examinations.                  Arrythmias:Atrial Fibrillation; Risk Factors:Hypertension,                  Diabetes and Dyslipidemia.  Sonographer:     Cristela Blue RDCS (AE) Referring Phys:  [id number] Andris Baumann Diagnosing Phys: Lorine Bears MD  Sonographer Comments: No apical window. IMPRESSIONS  1. Left ventricular ejection fraction, by estimation, is 50 to 55%. The left ventricle has low normal function. Left ventricular endocardial border not optimally defined to evaluate regional wall motion. Left ventricular diastolic function could not be evaluated.  2. Right ventricular systolic function is normal. The right ventricular size is normal. Tricuspid regurgitation signal is inadequate for assessing PA pressure.  3. Left atrial size was mildly dilated.  4. The mitral valve is normal in structure. No evidence of mitral valve regurgitation. No evidence of mitral stenosis.  5. The aortic valve is normal in structure.  Aortic valve regurgitation is not visualized. Mild aortic valve sclerosis is present, with no evidence of aortic valve stenosis.  6. Mildly dilated pulmonary artery.  7. The inferior vena cava is normal in size with greater than 50% respiratory variability, suggesting right atrial pressure of 3 mmHg. FINDINGS  Left Ventricle: Left ventricular ejection fraction, by estimation, is 50 to 55%. The left ventricle has low normal function. Left ventricular endocardial border not optimally defined to evaluate regional wall motion. The left ventricular internal cavity  size was normal in size. There is borderline left ventricular hypertrophy. Left ventricular diastolic function could not be evaluated. Right Ventricle: The right ventricular size is normal. No increase in right ventricular wall thickness. Right ventricular systolic function is normal. Tricuspid regurgitation signal is inadequate for assessing PA pressure. The tricuspid regurgitant velocity is 2.67 m/s, and with an assumed right atrial pressure of 5 mmHg, the estimated right ventricular systolic pressure is 33.5 mmHg. Left Atrium: Left atrial size was mildly dilated. Right Atrium: Right atrial size was normal in size. Pericardium: There is no evidence of pericardial effusion. Mitral Valve: The mitral valve is normal in structure. Normal mobility of the mitral valve leaflets. Mild mitral annular calcification. No evidence of mitral valve regurgitation. No evidence of mitral valve stenosis. Tricuspid Valve: The tricuspid valve is normal in structure. Tricuspid valve regurgitation is trivial. No evidence of tricuspid stenosis. Aortic Valve: The aortic valve is normal in structure. Aortic valve regurgitation is not visualized. Mild aortic valve sclerosis is present, with no evidence of aortic valve stenosis. Pulmonic Valve: The pulmonic valve was normal in structure. Pulmonic valve regurgitation is trivial. No evidence of pulmonic stenosis. Aorta: The aortic root  is normal in size and structure. Pulmonary Artery: The pulmonary artery is mildly dilated. Venous: The inferior vena cava is normal in size with greater than 50% respiratory variability, suggesting right atrial pressure of 3 mmHg. IAS/Shunts: No atrial level shunt detected by color flow Doppler.  LEFT VENTRICLE PLAX 2D LVIDd:         5.30 cm LVIDs:         3.95 cm LV PW:         0.68 cm LV IVS:        1.77 cm LVOT diam:     2.00 cm LVOT  Area:     3.14 cm  LEFT ATRIUM         Index LA diam:    4.70 cm 2.08 cm/m                        PULMONIC VALVE AORTA                 PV Vmax:        0.73 m/s Ao Root diam: 3.20 cm PV Peak grad:   2.1 mmHg                       RVOT Peak grad: 7 mmHg  TRICUSPID VALVE TR Peak grad:   28.5 mmHg TR Vmax:        267.00 cm/s  SHUNTS Systemic Diam: 2.00 cm Lorine Bears MD Electronically signed by Lorine Bears MD Signature Date/Time: 01/11/2020/5:11:53 PM    Final    [place] EKG SITE RITE  Result Date: 01/20/2020 If Site Rite image not attached, placement could not be confirmed due to current cardiac rhythm.   Assessment & Plan    Principal Problem:   Acute exacerbation of CHF (congestive heart failure) (HCC) Active Problems:   CAD, multiple vessel   Atrial fibrillation status post cardioversion 10/22/19 [facility])   Essential hypertension   DM (diabetes mellitus), type 2 (HCC)   Symptomatic anemia   Suspect Septic arthritis (HCC)   Chronic kidney disease, stage 3a   A-fib (HCC)   CHF exacerbation (HCC)    1. Acute on chronic diastolic CHF, recently discharged 02/01/2020 for the same, with recurrent weight gain, shortness of breath, peripheral edema, and orthopnea. BNP 1387, troponin flat. Chest xray with pulmonary edema and small bilateral effusions, with no interval changes on chest xray. Receiving IV Lasix and spironolactone. Symptoms improving status post blood transfusionsand diuresis. Complicated by anemia and CKD stage III. Unlikely caused by ischemia as troponin is  flat, ECG is without evidence of ischemia, and patient denies chest pain.  Patient currently on torsemide 80 mg IV every morning and 40 mg every afternoon. 2. Atrial fibrillation, status post cardioversion 10/2019, currently in sinus rhythm, on amiodarone for rhythm control, diltiazem and carvedilol for rate control, not anticoagulated due to anemia and recent history of GI bleed. 3. Anemia,hemoglobin dropped from 8.4 to 7.0 eight daysprior, status post blood transfusions.Hemoglobin 9.8today 4. CKD stage III, creatinineslightly worse at1.47, BUN28, GFR 45. 5. Morbid obesity,BMI 41 6. 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 Coronary artery disease,status post CABG, without chest pain. 8. Bradycardia, asymptomatic, while on Cardizem CD 180 mg and carvedilol 6.25 mg BID  Recommendations  1.  Agree with current therapy 2.  Continue amiodarone at 200 mg for rate and rhythm control 3.  Continue carvedilol and diltiazem at 3.125 mg bid and 120 mg daily respectively 4.  Defer chronic anticoagulation in light of recent GI bleed and anemia 5.  Continue diuresis but will convert to po torsemide at 40 mg daily in preparation for possible discharge.  6.  Carefully monitor renal status 7.  Monitor daily weights and I&O's 8.  Defer further cardiac diagnostics at this time 9.  Follow-up with Dr. Heide Scales after discharge  Signed, Darlin Priestly. Annella Prowell MD 02/08/2020, 11:33 AM  Pager: [phone]

## 2020-02-08 NOTE — Progress Notes (Signed)
Mobility Specialist - Progress Note   02/08/20 1207  Mobility  Activity Transferred to/from [facility]  Level of Assistance Moderate assist, patient does 50-74%  Assistive Device Front wheel walker  Distance Ambulated (ft) 8 ft  Mobility Response Tolerated well  Mobility performed by Mobility specialist  $Mobility charge 1 Mobility    Pre-mobility: 64 HR, 157/56 BP, 95% SpO2 Post-mobility: 74 HR, 93% SpO2   Pt laying in bed w/ son present in room upon arrival. Pt currently utilizing 2L O2 [place]. Pt agreed to mobility session. Pt requested to go to [facility]. Pt able to transition from supine to sit w/ min. Assist. Pt needed mod. Assist to S2S and to transfer from bed to [facility]. Pt had a BM and urinary output. Mobility specialist assisted pt w/ S2S, while son assisted w/ hygiene. Pt able to transfer from [facility] to recliner w/ mod. Assist. Noted pt had SOB t/o session during each transfer. Pt able to manage SOB by utilizing PLB. O2 sat >88% during session. Overall, pt tolerated session well. Pt left in recliner w/ son present in room and all needs placed in reach. Doctor entered room at the end of session. Nurse and NT was notified.     Gwyn Mehring Mobility Specialist  02/08/20, 12:17 PM

## 2020-02-08 NOTE — TOC Transition Note (Signed)
Transition of Care [facility]) - CM/SW Discharge Note   Patient Details  Name: Jorian Willhoite MRN: [id number] Date of Birth: 05-03-1941  Transition of Care [facility]) CM/SW Contact:  Eilleen Kempf, LCSW Phone Number: 02/08/2020, 12:08 PM   Clinical Narrative:    Patient scheduled for discharge today. Son called wanting to know when the DME will be delivered. CSW called Jermaine with [employer] and confirmed the hospital bed and wheelchair will be delivered today.   Final next level of care: Home w Home Health Services Barriers to Discharge: Continued Medical Work up   Patient Goals and CMS Choice Patient states their goals for this hospitalization and ongoing recovery are:: to go home with son to rehab CMS Medicare.gov Compare Post Acute Care list provided to:: Patient Choice offered to / list presented to : Patient  Discharge Placement                       Discharge Plan and Services     Post Acute Care Choice: Home Health              Date DME Agency Contacted: 02/07/20 Time DME Agency Contacted: 1318 Representative spoke with at DME Agency: Vaughan Basta            Social Determinants of Health (SDOH) Interventions     Readmission Risk Interventions No flowsheet data found.

## 2020-02-09 LAB — BASIC METABOLIC PANEL
Anion gap: 10 (ref 5–15)
BUN: 26 mg/dL — ABNORMAL HIGH (ref 8–23)
CO2: 34 mmol/L — ABNORMAL HIGH (ref 22–32)
Calcium: 8 mg/dL — ABNORMAL LOW (ref 8.9–10.3)
Chloride: 97 mmol/L — ABNORMAL LOW (ref 98–111)
Creatinine, Ser: 1.69 mg/dL — ABNORMAL HIGH (ref 0.61–1.24)
GFR calc Af Amer: 44 mL/min — ABNORMAL LOW (ref >60)
GFR calc non Af Amer: 38 mL/min — ABNORMAL LOW (ref >60)
Glucose, Bld: 169 mg/dL — ABNORMAL HIGH (ref 70–99)
Potassium: 3.7 mmol/L (ref 3.5–5.1)
Sodium: 141 mmol/L (ref 135–145)

## 2020-02-09 LAB — CBC
HCT: 31.1 % — ABNORMAL LOW (ref 39.0–52.0)
Hemoglobin: 9.5 g/dL — ABNORMAL LOW (ref 13.0–17.0)
MCH: 27.9 pg (ref 26.0–34.0)
MCHC: 30.5 g/dL (ref 30.0–36.0)
MCV: 91.5 fL (ref 80.0–100.0)
Platelets: 182 10*3/uL (ref 150–400)
RBC: 3.4 MIL/uL — ABNORMAL LOW (ref 4.22–5.81)
RDW: 17.2 % — ABNORMAL HIGH (ref 11.5–15.5)
WBC: 8.4 10*3/uL (ref 4.0–10.5)
nRBC: 0 % (ref 0.0–0.2)

## 2020-02-09 LAB — GLUCOSE, CAPILLARY
Glucose-Capillary: 154 mg/dL — ABNORMAL HIGH (ref 70–99)
Glucose-Capillary: 198 mg/dL — ABNORMAL HIGH (ref 70–99)

## 2020-02-09 MED ORDER — PENICILLIN G POTASSIUM IV (FOR PTA / DISCHARGE USE ONLY): 24.0000 10*6.[IU] | INTRAVENOUS | 0 refills | Status: AC

## 2020-02-09 MED ORDER — COMPRESSOR/NEBULIZER MISC: 1.0000 [IU] | 0 refills | Status: DC

## 2020-02-09 MED ORDER — TORSEMIDE 20 MG PO TABS: 20.0000 mg | ORAL_TABLET | Freq: Two times a day (BID) | ORAL | Status: DC

## 2020-02-09 MED ORDER — TORSEMIDE 20 MG PO TABS: 20.0000 mg | ORAL_TABLET | Freq: Two times a day (BID) | ORAL | 1 refills | Status: DC

## 2020-02-09 MED ORDER — DILTIAZEM HCL ER COATED BEADS 120 MG PO CP24: 120.0000 mg | ORAL_CAPSULE | Freq: Every day | ORAL | 1 refills | Status: DC

## 2020-02-09 MED ORDER — CARVEDILOL 3.125 MG PO TABS
3.1250 mg | ORAL_TABLET | Freq: Two times a day (BID) | ORAL | 1 refills | Status: DC
Start: 2020-02-09 — End: 2022-06-11

## 2020-02-09 NOTE — Plan of Care (Signed)
  Problem: Education: Goal: Knowledge of General Education information will improve Description: Including pain rating scale, medication(s)/side effects and non-pharmacologic comfort measures Outcome: Progressing   Problem: Clinical Measurements: Goal: Respiratory complications will improve Outcome: Progressing   Problem: Safety: Goal: Ability to remain free from injury will improve Outcome: Progressing   

## 2020-02-09 NOTE — Plan of Care (Signed)

## 2020-02-09 NOTE — Discharge Summary (Signed)
Kaydn Kumpf XNA:[id number] DOB: [birth year] DOA: 02/02/2020  PCP: Dionne Bucy., MD  Admit date: 02/02/2020 Discharge date: 02/09/2020  Time spent: 45 minutes  Recommendations for Outpatient Follow-up:  1. Close monitoring of fluid status and kidney function (will have weekly labs including kidney function while receiving IV antibiotics)  2. Has f/u in one week with the [facility] and in one week with his PCP, as well as general cardiology follow-up scheduled in a few weeks   Discharge Diagnoses:  Principal Problem:   Acute exacerbation of CHF (congestive heart failure) ([place]) Active Problems:   CAD, multiple vessel   Atrial fibrillation status post cardioversion 10/22/19 [facility])   Essential hypertension   DM (diabetes mellitus), type 2 ([place])   Symptomatic anemia   Suspect Septic arthritis ([place])   Chronic kidney disease, stage 3a   A-fib ([place])   CHF exacerbation ([place])   Discharge Condition: fair  Diet recommendation: heart healthy  Filed Weights   02/07/20 0438 02/08/20 0413 02/09/20 0532  Weight: 120.6 kg 122.2 kg 120.5 kg    History of present illness:  Harlin Mazzoni a 79 y.o.malewith medical history significant ofcoronary artery disease status post carotid bypass graft, diabetes type 2 with chronic kidney disease stage III, atrial fibrillation status post cardioversion in June this year previously on Pradaxa, no history of GI bleed, hypertension among other things who was just discharged yesterday after admission from September 6 with congestive heart failure as well as acute kidney injury. He had left septic arthritis and was on IV antibiotics followed by orthopedics during 2 previous hospitalizations. Patient complained of worsening shortness of breath and swelling. No fever or chills no nausea vomiting or diarrhea. At discharge patient was transitioned from IV Lasix to oral Lasix 40 mg daily. He had an echo done in August showing EF of 50 to  55%. His weight was getting better. Given incentive spirometer and to follow-up with his cardiologist over at [facility]. 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 He had surgery in August where prosthesis was removed and antibiotic spacer was placed. He was to stay off anticoagulation again except for aspirin due to his recent GI bleed. Patient is now back with increasing his weight worsening shortness of breath and found to have bilateral pulmonary edema. Is being readmitted to the hospital for possible treatment. Additionally patient was found to have possible infiltrates bilaterally. Repeat COVID-19 so far is negative. Is being admitted for further treatment..  ED Course:Temperature is 98 blood pressure 150/52 pulse 50 respirate of 31 oxygen sat 91% on room air. White count 8.6 hemoglobin 7.1 platelets 251 sodium 142 potassium 4.2 chloride 90 CO2 35 BUN 30 creatinine 1.57 calcium 8.5. Glucose is 230. BNP of 1387. Chest x-ray showed bilateral pleural effusion and pulmonary edema. Patient be readmitted for further treatment  **Interim History Cardiology was consulted given his recurrent CHF exacerbation recommending continue IV Lasix and chronic amiodarone control on carvedilol as well as diltiazem for rate control of his atrial fibrillation.  Currently not anticoagulated due to his new recent anemia and concern for possible GI bleed.[name]  He was given 2 more units this admission. H/H stable 9/15. Admission continued for continued IV diuresis  Hospital Course:  Acute Respiratory Failure with Hypoxia in the setting of Acute on Chronic Diastolic CHF Acute exacerbation of congestive heart failure -Patient has diastolic dysfunction. -BNP on Admission was 1,387.0 -CXR showed "Interstitial and airspace opacities diffusely likely reflect edema/CHF. More confluent opacity  at the left lung base could reflect pneumonia. Small effusions.." -He was treated with IV furosemide, diuretics  now transitioned to torsemide 20 mg po bid in preparation for discharge -Respiratory status not back to baseline but improving. On 3 L currently, was discharged on O2 last hospitalization, will be discharged on 02  Symptomatic Anemia Anemia of Chronic Kidney Disease -Hemoglobin down to 7.0 on admission.  -He has not noticed any significant bleeding in his stools or other sites. He was transfused 2 units of PRBC since he was short of breath and had decompensated CHF.  -Follow-up hemoglobins stable in the 9s -Continue to monitor closely. Stool guaiac negative  Paroxysmal Atrial Fibrillation -Currently in sinus rhythm. -atient underwent ablation procedure in June and has been off of anticoagulation.  -Continue rate control appropriately. Continue on amiodarone, carvedilol and diltiazem (cardiology reduced doses of carvedilol and diltiazem)  CAD -This is stable at baseline.  Diabetes Mellitus Type 2  - controlled - Continue with moderate NovoLog sliding scale insulin AC and at bedtime  Septic arthritis of the Left Leg -Continued with his homeampicillin. ID has consulted and has outpt recs. Switching to continuous infusion of penicillin through 10/2. [place] arranged. -Plans to follow-up with orthopedics. - has right PICC that will need to be removed after completion of abx  AKI on Chronic kidney disease stage IIIa - kidney function now back to baseline with IV diuresis  Essential Hypertension - moderately well controlled -C/w Carvedilol 3.125 mg po BID and Diltiazem 120 mg po Daily (reduced by cardiology)  Procedures: None  Consultations:  cardiology  Discharge Exam: Vitals:   02/09/20 0744 02/09/20 1147  BP: (!) 187/83 (!) 163/68  Pulse: 76 62  Resp: 17 16  Temp: 97.7 F (36.5 C) 97.9 F (36.6 C)  SpO2: 95% 98%    Constitutional: WN/WD morbidly obese Caucasian male currently in NAD and appears calm and comfortable Eyes: Lids and conjunctivae normal,  sclerae anicteric  ENMT: External Ears, Nose appear normal. Grossly normal hearing.  Respiratory: Diminished to auscultation bilaterally with faint breath sounds, rales at bases.  Normal respiratory effort and patient is not tachypenic. No accessory muscle use.  Cardiovascular: RRR, no murmurs / rubs / gallops.  1+ lower extremity edema Abdomen: Soft, non-tender, distended secondary by habitus.  Bowel sounds positive.  GU: Deferred. Musculoskeletal: No clubbing / cyanosis of digits/nails.  Left leg is immobilized from his septic arthritis Skin: hyperpigmentation LEs, No induration; Warm and dry.  Neurologic: symmetric movements  Psychiatric: Normal judgment and insight. Alert and oriented x 3. Normal mood and appropriate affect.   Discharge Instructions   Discharge Instructions    [facility] pharmacist to adjust dose for Vancomycin, Aminoglycosides and other anti-infective therapies as requested by physician.   Complete by: As directed    [facility] to provide Cath Flo 34m   Complete by: As directed    Administer for PICC line occlusion and as ordered by physician for other access device issues.   Anaphylaxis Kit: Provided to treat any anaphylactic reaction to the medication being provided to the patient if First Dose or when requested by physician   Complete by: As directed    Epinephrine 139mml vial / amp: Administer 0.25m225m0.25ml55mubcutaneously once for moderate to severe anaphylaxis, nurse to call physician and pharmacy when reaction occurs and call 911 if needed for immediate care   Diphenhydramine 50mg12mIV vial: Administer 25-50mg 74mM PRN for first dose reaction, rash, itching, mild reaction, nurse to call  physician and pharmacy when reaction occurs   Sodium Chloride 0.9% NS 562m IV: Administer if needed for hypovolemic blood pressure drop or as ordered by physician after call to physician with anaphylactic reaction   Call MD for:  difficulty breathing,  headache or visual disturbances   Complete by: As directed    Call MD for:  extreme fatigue   Complete by: As directed    Call MD for:  persistant dizziness or light-headedness   Complete by: As directed    Call MD for:  persistant nausea and vomiting   Complete by: As directed    Call MD for:  redness, tenderness, or signs of infection (pain, swelling, redness, odor or green/yellow discharge around incision site)   Complete by: As directed    Call MD for:  temperature >100.4   Complete by: As directed    Change dressing on IV access line weekly and PRN   Complete by: As directed    Diet - low sodium heart healthy   Complete by: As directed    Flush IV access with Sodium Chloride 0.9% and Heparin 10 units/ml or 100 units/ml   Complete by: As directed    Home infusion instructions - [facility]   Complete by: As directed    Instructions: Flush IV access with Sodium Chloride 0.9% and Heparin 10units/ml or 100units/ml   Change dressing on IV access line: Weekly and PRN   Instructions Cath Flo 251m Administer for PICC Line occlusion and as ordered by physician for other access device   [facility] pharmacist to adjust dose for: Vancomycin, Aminoglycosides and other anti-infective therapies as requested by physician   Increase activity slowly   Complete by: As directed    Method of administration may be changed at the discretion of home infusion pharmacist based upon assessment of the patient and/or caregiver's ability to self-administer the medication ordered   Complete by: As directed    No wound care   Complete by: As directed      Allergies as of 02/09/2020   No Known Allergies     Medication List    STOP taking these medications   ampicillin  IVPB   furosemide 40 MG tablet Commonly known as: LASIX     TAKE these medications   acetaminophen 500 MG tablet Commonly known as: TYLENOL Take 500-1,000 mg by mouth every 6 (six) hours as needed for mild  pain or fever.   albuterol 108 (90 Base) MCG/ACT inhaler Commonly known as: VENTOLIN HFA Inhale 2 puffs into the lungs every 6 (six) hours as needed for wheezing.   amiodarone 200 MG tablet Commonly known as: PACERONE Take 200 mg by mouth daily.   aspirin EC 81 MG tablet Take 1 tablet (81 mg total) by mouth daily. Swallow whole.   atorvastatin 40 MG tablet Commonly known as: LIPITOR Take 40 mg by mouth every evening.   carvedilol 3.125 MG tablet Commonly known as: COREG Take 1 tablet (3.125 mg total) by mouth 2 (two) times daily with a meal. What changed:   medication strength  how much to take   Compressor/Nebulizer Misc 1 Units by Does not apply route as directed.   diltiazem 120 MG 24 hr capsule Commonly known as: CARDIZEM CD Take 1 capsule (120 mg total) by mouth daily. Start taking on: February 10, 2020 What changed:   medication strength  how much to take   fluticasone 50 MCG/ACT nasal spray Commonly known as: FLONASE Place 2 sprays  into both nostrils daily as needed for allergies or rhinitis.   guaiFENesin 600 MG 12 hr tablet Commonly known as: MUCINEX Take 1 tablet (600 mg total) by mouth 2 (two) times daily as needed for to loosen phlegm.   ipratropium-albuterol 0.5-2.5 (3) MG/3ML Soln Commonly known as: DUONEB Take 3 mLs by nebulization every 6 (six) hours as needed.   mupirocin ointment 2 % Commonly known as: BACTROBAN Apply 1 application topically daily as needed (leg sores).   penicillin G  IVPB Inject 24 Million Units into the vein continuous for 15 days. PCN G 24 million units q24h as continuous infusion Indication:  Enterococcal knee PJI First Dose: Yes Last Day of Therapy: 02/22/2020 Labs - Once weekly:  CBC/d, CMP, ESR Method of administration: Elastomeric (Continuous infusion) Method of administration may be changed at the discretion of home infusion pharmacist based upon assessment of the patient and/or caregiver's ability to  self-administer the medication ordered.   ramipril 10 MG capsule Commonly known as: ALTACE Take 1 capsule (10 mg total) by mouth daily.   Semaglutide (1 MG/DOSE) 2 MG/1.5ML Sopn Inject 1 mg into the skin every Thursday.   spironolactone 25 MG tablet Commonly known as: ALDACTONE Take 25 mg by mouth daily.   torsemide 20 MG tablet Commonly known as: DEMADEX Take 1 tablet (20 mg total) by mouth 2 (two) times daily.   traMADol 50 MG tablet Commonly known as: ULTRAM Take 1 tablet (50 mg total) by mouth every 8 (eight) hours as needed for moderate pain or severe pain.   vitamin B-12 1000 MCG tablet Commonly known as: CYANOCOBALAMIN Take 1,000 mcg by mouth daily.            Durable Medical Equipment  (From admission, onward)         Start     Ordered   02/09/20 1231  DME Oxygen  Once       Question Answer Comment  Length of Need Lifetime   Oxygen delivery system Gas      02/09/20 1232   02/08/20 1443  For home use only DME oxygen  Once       Question Answer Comment  Length of Need Lifetime   Mode or (Route) Nasal cannula   Liters per Minute 2   Oxygen delivery system Gas      02/08/20 1443   02/06/20 1258  For home use only DME standard manual wheelchair with seat cushion  Once       Comments: Patient suffers from septic kneewhich impairs their ability to perform daily activities like weightbearing on legin the home.  A walker will not resolve issue with performing activities of daily living. A wheelchair will allow patient to safely perform daily activities. Patient can safely propel the wheelchair in the home or has a caregiver who can provide assistance.  Accessories: elevating leg rests (ELRs), wheel locks, extensions and anti-tippers.   02/06/20 1259   02/06/20 1256  For home use only DME Hospital bed  Once       Question Answer Comment  Length of Need 6 Months   The above medical condition requires: Patient requires the ability to reposition immediately   Head  must be elevated greater than: 45 degrees   Bed type Semi-electric      02/06/20 1259           Discharge Care Instructions  (From admission, onward)         Start     Ordered  02/09/20 0000  Change dressing on IV access line weekly and PRN  (Home infusion instructions - [facility] )        02/09/20 1232         No Known Allergies  Follow-up Information    [facility] Follow up on 02/14/2020.   Specialty: Cardiology Why: at 12:00pm. Enter through the [place] entrance Contact information: [place] [phone]               The results of significant diagnostics from this hospitalization (including imaging, microbiology, ancillary and laboratory) are listed below for reference.    Significant Diagnostic Studies: DG Chest 1 View  Result Date: 02/05/2020 CLINICAL DATA:  Shortness of breath EXAM: CHEST  1 VIEW COMPARISON:  02/04/2020, 02/02/2020, 01/27/2020 FINDINGS: Post sternotomy changes. Right upper extremity central venous catheter tip projects over the brachiocephalic region. Cardiomegaly with vascular congestion and diffuse bilateral interstitial and ground-glass opacity most likely reflecting pulmonary edema. Bilateral effusions and basilar consolidation without change. Aortic atherosclerosis. No pneumothorax. IMPRESSION: No significant interval change in appearance of the chest with cardiomegaly, vascular congestion, pulmonary edema and bilateral effusions with basilar consolidation. Electronically Signed   By: Donavan Foil M.D.   On: 02/05/2020 02:12   DG Chest 1 View  Result Date: 02/04/2020 CLINICAL DATA:  Shortness of breath EXAM: CHEST  1 VIEW COMPARISON:  02/02/2020 FINDINGS: Cardiomegaly. Prior CABG. Diffuse interstitial and alveolar opacities throughout the lungs, similar to prior study. More consolidated appearance in the left lower lobe,  similar to prior study. Some improvement in aeration at the right lung base. Small bilateral effusions. No acute bony abnormality. IMPRESSION: Interstitial and airspace opacities diffusely likely reflect edema/CHF. More confluent opacity at the left lung base could reflect pneumonia. Small effusions. Electronically Signed   By: Rolm Baptise M.D.   On: 02/04/2020 10:41   DG Chest 2 View  Result Date: 01/10/2020 CLINICAL DATA:  Weakness EXAM: CHEST - 2 VIEW COMPARISON:  06/26/2019 FINDINGS: Hazy interstitial opacities with a mid to lower lung predominance with some slight vascular cephalization/congestion. No pneumothorax. No effusion. No focal consolidation. Cardiomegaly appearance is similar to priors with evidence of prior sternotomy and CABG and a calcified, tortuous aorta. Cardiomediastinal contours are otherwise unremarkable. No acute osseous or soft tissue abnormality. Degenerative changes are present in the imaged spine and shoulders. IMPRESSION: Features of CHF/volume overload with cardiomegaly and findings suggesting mild interstitial pulmonary edema. Electronically Signed   By: Lovena Le M.D.   On: 01/10/2020 19:19   DG Knee 1-2 Views Left  Result Date: 01/11/2020 CLINICAL DATA:  Bilateral knee replacements.  Pain and swelling. EXAM: LEFT KNEE - 1-2 VIEW COMPARISON:  None. FINDINGS: Left total knee arthroplasty without perihardware lucency. Intermediate sized suprapatellar joint effusion. No evidence of arthropathy or other focal bone abnormality. Soft tissues are unremarkable. IMPRESSION: 1. Intermediate sized suprapatellar joint effusion. 2. Uncomplicated left total knee arthroplasty. Electronically Signed   By: Ulyses Jarred M.D.   On: 01/11/2020 04:34   DG Knee 1-2 Views Right  Result Date: 01/11/2020 CLINICAL DATA:  Knee replacements.  Swelling and pain. EXAM: RIGHT KNEE - 1-2 VIEW COMPARISON:  None. FINDINGS: There is a right total knee arthroplasty. No perihardware lucency. No evidence  of arthropathy or other focal bone abnormality. Soft tissues are unremarkable. 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 Electronically Signed   By: Cletus Gash.D.  On: 01/11/2020 04:35   US RENAL  Result Date: 01/27/2020 CLINICAL DATA:  Acute kidney injury.  Chronic kidney disease. EXAM: RENAL / URINARY TRACT ULTRASOUND COMPLETE COMPARISON:  Abdomen and pelvis CT dated 09/05/2016. FINDINGS: Right Kidney: Renal measurements: 10.8 x 6.1 x 5.9 cm = volume: 201 mL. Echogenicity within normal limits. No mass or hydronephrosis visualized. Left Kidney: Renal measurements: 12.2 x 6.8 x 5.8 cm = volume: 228 mL. Echogenicity within normal limits. No mass or hydronephrosis visualized. Bladder: Not distended, with a Foley catheter in place. Other: None. IMPRESSION: Normal examination. Electronically Signed   By: Claudie Revering M.D.   On: 01/27/2020 17:15   DG Chest Port 1 View  Result Date: 02/02/2020 CLINICAL DATA:  Shortness of breath, cough and edema EXAM: PORTABLE CHEST 1 VIEW COMPARISON:  Radiograph 01/27/2020 FINDINGS: Right upper extremity central venous catheter tip terminates at the expected location of the right brachiocephalic-superior caval confluence. Unchanged in position from comparison. Telemetry leads and nasal cannula overlie the chest. Evidence of prior sternotomy and likely CABG with fracture of the second superior most sternal suture. Prominence of the cardiac silhouette likely reflecting some mild cardiomegaly though possibly accentuated by low volumes and portable technique. Increasing interstitial and patchy opacities, indistinct pulmonary vascularity as well as some septal thickening and bilateral pleural effusions. No pneumothorax. More consolidative opacity is present in the right lung base as well. No acute osseous or soft tissue abnormality. Degenerative changes are present in the imaged spine and shoulders. IMPRESSION: 1. Features compatible with CHF  demonstrating worsening pulmonary edema and new small bilateral pleural effusions. 2. More consolidative opacity in the right lung base could reflect developing infection, alveolar edema, and/or atelectasis. 3. Right upper extremity central venous catheter tip terminates at the expected location of the right brachiocephalic-superior caval confluence. Electronically Signed   By: Lovena Le M.D.   On: 02/02/2020 19:37   DG Chest Portable 1 View  Result Date: 01/27/2020 CLINICAL DATA:  Shortness of breath. History of diabetes, atrial fibrillation and hypertension. EXAM: PORTABLE CHEST 1 VIEW COMPARISON:  Radiographs 01/10/2020 and 06/26/2019. FINDINGS: 0814 hours. Two views obtained. There is stable cardiac enlargement post median sternotomy and CABG. There is aortic atherosclerosis. There is new mild pulmonary edema and probable small bilateral pleural effusions. No confluent airspace opacity or pneumothorax. Apparent right arm PICC not visualized beyond the level of the right sternoclavicular joint, likely in the right brachiocephalic vein. No acute osseous findings. IMPRESSION: 1. New mild pulmonary edema and probable small bilateral pleural effusions consistent with congestive heart failure. 2. Apparent right arm PICC tip is likely in the right brachiocephalic vein. Electronically Signed   By: Richardean Sale M.D.   On: 01/27/2020 08:24   ECHOCARDIOGRAM COMPLETE  Result Date: 01/11/2020    ECHOCARDIOGRAM REPORT   Patient Name:   EARLIN SWEEDEN Date of Exam: 01/11/2020 Medical Rec #:  [id number]     Height:       70.0 in Accession #:    [id number]    Weight:       240.0 lb Date of Birth:  15-Aug-1940      BSA:          2.255 m Patient Age:    89 years      BP:           134/48 mmHg Patient Gender: M             HR:           74 bpm.  Exam Location:  [facility] Procedure: 2D Echo, Cardiac Doppler and Color Doppler Indications:     CHF- acute diastolic 016.01  History:         Patient has no prior history of  Echocardiogram examinations.                  Arrythmias:Atrial Fibrillation; Risk Factors:Hypertension,                  Diabetes and Dyslipidemia.  Sonographer:     Sherrie Sport RDCS (AE) Referring Phys:  [id number] Athena Masse Diagnosing Phys: Kathlyn Sacramento MD  Sonographer Comments: No apical window. IMPRESSIONS  1. Left ventricular ejection fraction, by estimation, is 50 to 55%. The left ventricle has low normal function. Left ventricular endocardial border not optimally defined to evaluate regional wall motion. Left ventricular diastolic function could not be evaluated.  2. Right ventricular systolic function is normal. The right ventricular size is normal. Tricuspid regurgitation signal is inadequate for assessing PA pressure.  3. Left atrial size was mildly dilated.  4. The mitral valve is normal in structure. No evidence of mitral valve regurgitation. No evidence of mitral stenosis.  5. The aortic valve is normal in structure. Aortic valve regurgitation is not visualized. Mild aortic valve sclerosis is present, with no evidence of aortic valve stenosis.  6. Mildly dilated pulmonary artery.  7. The inferior vena cava is normal in size with greater than 50% respiratory variability, suggesting right atrial pressure of 3 mmHg. FINDINGS  Left Ventricle: Left ventricular ejection fraction, by estimation, is 50 to 55%. The left ventricle has low normal function. Left ventricular endocardial border not optimally defined to evaluate regional wall motion. The left ventricular internal cavity  size was normal in size. There is borderline left ventricular hypertrophy. Left ventricular diastolic function could not be evaluated. Right Ventricle: The right ventricular size is normal. No increase in right ventricular wall thickness. Right ventricular systolic function is normal. Tricuspid regurgitation signal is inadequate for assessing PA pressure. The tricuspid regurgitant velocity is 2.67 m/s, and with an assumed right  atrial pressure of 5 mmHg, the estimated right ventricular systolic pressure is 73.2 mmHg. Left Atrium: Left atrial size was mildly dilated. Right Atrium: Right atrial size was normal in size. Pericardium: There is no evidence of pericardial effusion. Mitral Valve: The mitral valve is normal in structure. Normal mobility of the mitral valve leaflets. Mild mitral annular calcification. No evidence of mitral valve regurgitation. No evidence of mitral valve stenosis. Tricuspid Valve: The tricuspid valve is normal in structure. Tricuspid valve regurgitation is trivial. No evidence of tricuspid stenosis. Aortic Valve: The aortic valve is normal in structure. Aortic valve regurgitation is not visualized. Mild aortic valve sclerosis is present, with no evidence of aortic valve stenosis. Pulmonic Valve: The pulmonic valve was normal in structure. Pulmonic valve regurgitation is trivial. No evidence of pulmonic stenosis. Aorta: The aortic root is normal in size and structure. Pulmonary Artery: The pulmonary artery is mildly dilated. Venous: The inferior vena cava is normal in size with greater than 50% respiratory variability, suggesting right atrial pressure of 3 mmHg. IAS/Shunts: No atrial level shunt detected by color flow Doppler.  LEFT VENTRICLE PLAX 2D LVIDd:         5.30 cm LVIDs:         3.95 cm LV PW:         0.68 cm LV IVS:        1.77 cm LVOT diam:  2.00 cm LVOT Area:     3.14 cm  LEFT ATRIUM         Index LA diam:    4.70 cm 2.08 cm/m                        PULMONIC VALVE AORTA                 PV Vmax:        0.73 m/s Ao Root diam: 3.20 cm PV Peak grad:   2.1 mmHg                       RVOT Peak grad: 7 mmHg  TRICUSPID VALVE TR Peak grad:   28.5 mmHg TR Vmax:        267.00 cm/s  SHUNTS Systemic Diam: 2.00 cm Kathlyn Sacramento MD Electronically signed by Kathlyn Sacramento MD Signature Date/Time: 01/11/2020/5:11:53 PM    Final    [place] EKG SITE RITE  Result Date: 01/20/2020 If Site Rite image not attached, placement  could not be confirmed due to current cardiac rhythm.   Microbiology: Recent Results (from the past 240 hour(s))  SARS Coronavirus 2 by RT PCR (hospital order, performed in [facility] hospital lab) Nasopharyngeal Nasopharyngeal Swab     Status: None   Collection Time: 01/31/20 10:34 AM   Specimen: Nasopharyngeal Swab  Result Value Ref Range Status   SARS Coronavirus 2 NEGATIVE NEGATIVE Final    Comment: (NOTE) SARS-CoV-2 target nucleic acids are NOT DETECTED.  The SARS-CoV-2 RNA is generally detectable in upper and lower respiratory specimens during the acute phase of infection. The lowest concentration of SARS-CoV-2 viral copies this assay can detect is 250 copies / mL. A negative result does not preclude SARS-CoV-2 infection and should not be used as the sole basis for treatment or other patient management decisions.  A negative result may occur with improper specimen collection / handling, submission of specimen other than nasopharyngeal swab, presence of viral mutation(s) within the areas targeted by this assay, and inadequate number of viral copies (<250 copies / mL). A negative result must be combined with clinical observations, patient history, and epidemiological information.  Fact Sheet for Patients:   StrictlyIdeas.no  Fact Sheet for Healthcare Providers: BankingDealers.co.za  This test is not yet approved or  cleared by the [place] FDA and has been authorized for detection and/or diagnosis of SARS-CoV-2 by FDA under an Emergency Use Authorization (EUA).  This EUA will remain in effect (meaning this test can be used) for the duration of the COVID-19 declaration under Section 564(b)(1) of the Act, 21 U.S.C. section 360bbb-3(b)(1), unless the authorization is terminated or revoked sooner.  Performed at [facility], [address] [place], [place] [address]   SARS Coronavirus 2 by RT PCR (hospital order,  performed in [facility] hospital lab) Nasopharyngeal Nasopharyngeal Swab     Status: None   Collection Time: 02/02/20  7:18 PM   Specimen: Nasopharyngeal Swab  Result Value Ref Range Status   SARS Coronavirus 2 NEGATIVE NEGATIVE Final    Comment: (NOTE) SARS-CoV-2 target nucleic acids are NOT DETECTED.  The SARS-CoV-2 RNA is generally detectable in upper and lower respiratory specimens during the acute phase of infection. The lowest concentration of SARS-CoV-2 viral copies this assay can detect is 250 copies / mL. A negative result does not preclude SARS-CoV-2 infection and should not be used as the sole basis for treatment or other  patient management decisions.  A negative result may occur with improper specimen collection / handling, submission of specimen other than nasopharyngeal swab, presence of viral mutation(s) within the areas targeted by this assay, and inadequate number of viral copies (<250 copies / mL). A negative result must be combined with clinical observations, patient history, and epidemiological information.  Fact Sheet for Patients:   StrictlyIdeas.no  Fact Sheet for Healthcare Providers: BankingDealers.co.za  This test is not yet approved or  cleared by the [place] FDA and has been authorized for detection and/or diagnosis of SARS-CoV-2 by FDA under an Emergency Use Authorization (EUA).  This EUA will remain in effect (meaning this test can be used) for the duration of the COVID-19 declaration under Section 564(b)(1) of the Act, 21 U.S.C. section 360bbb-3(b)(1), unless the authorization is terminated or revoked sooner.  Performed at [facility], [address] [place], [place] [address]      Labs: Basic Metabolic Panel: Recent Labs  Lab 02/05/20 0435 02/06/20 0635 02/07/20 0655 02/08/20 0645 02/09/20 0500  NA 142 141 142 141 141  K 3.8 3.9 3.7 3.8 3.7  CL 97* 96* 96* 98 97*  CO2  36* 34* 36* 36* 34*  GLUCOSE 190* 191* 166* 190* 169*  BUN 28* 25* 28* 24* 26*  CREATININE 1.36* 1.46* 1.47* 1.47* 1.69*  CALCIUM 8.2* 8.2* 8.3* 8.2* 8.0*  MG 1.9  --   --   --   --   PHOS 3.3  --   --   --   --    Liver Function Tests: Recent Labs  Lab 02/02/20 1918 02/05/20 0435  AST 18 22  ALT 22 27  ALKPHOS 96 101  BILITOT 0.7 0.8  PROT 5.5* 5.5*  ALBUMIN 2.4* 2.5*   No results for input(s): LIPASE, AMYLASE in the last 168 hours. No results for input(s): AMMONIA in the last 168 hours. CBC: Recent Labs  Lab 02/05/20 0435 02/06/20 0635 02/07/20 0655 02/08/20 0645 02/09/20 0500  WBC 8.5 8.5 9.2 7.4 8.4  NEUTROABS 6.8  --   --   --   --   HGB 9.2* 9.6* 9.8* 9.3* 9.5*  HCT 29.5* 30.7* 32.2* 30.6* 31.1*  MCV 90.5 90.0 91.5 90.8 91.5  PLT 204 214 211 171 182   Cardiac Enzymes: No results for input(s): CKTOTAL, CKMB, CKMBINDEX, TROPONINI in the last 168 hours. BNP: BNP (last 3 results) Recent Labs    01/10/20 1837 01/27/20 0811 02/02/20 1918  BNP 401.2* 1,344.1* 1,387.0*    ProBNP (last 3 results) No results for input(s): PROBNP in the last 8760 hours.  CBG: Recent Labs  Lab 02/08/20 1206 02/08/20 1725 02/08/20 2055 02/09/20 0746 02/09/20 1149  GLUCAP 202* 148* 167* 154* 198*       Signed:  Desma Maxim MD.  [facility] 02/09/2020, 12:33 PM

## 2020-02-09 NOTE — Progress Notes (Signed)
Patient Name: Randall Vincent Date of Encounter: 02/09/2020  Hospital Problem List     Principal Problem:   Acute exacerbation of CHF (congestive heart failure) ([place]) Active Problems:   CAD, multiple vessel   Atrial fibrillation status post cardioversion 10/22/19 [facility])   Essential hypertension   DM (diabetes mellitus), type 2 (HCC)   Symptomatic anemia   Suspect Septic arthritis ([place])   Chronic kidney disease, stage 3a   A-fib ([place])   CHF exacerbation ([place])    Patient Profile      79 y.o.malewith medical history significant ofcoronary artery disease status post carotid bypass graft, diabetes type 2 with chronic kidney disease stage III, atrial fibrillation status post cardioversion in June this year previously on Pradaxa, no history of GI bleed, hypertension among other things who was just discharged yesterday after admission from September 6 with congestive heart failure as well as acute kidney injury.He is being treated for chf.  Subjective   Has hospital bed and supplies at home. Anxious to leave. Has visiting nurse set up for tomorrow. Will be able to get labs drawn to follow up renal function. Desires an order for a home nebulizer.   Inpatient Medications    . amiodarone  200 mg Oral Daily  . atorvastatin  40 mg Oral QPM  . carvedilol  3.125 mg Oral BID WC  . diltiazem  120 mg Oral Daily  . enoxaparin (LOVENOX) injection  40 mg Subcutaneous Q12H  . insulin aspart  0-15 Units Subcutaneous TID WC  . insulin aspart  0-5 Units Subcutaneous QHS  . sodium chloride flush  3 mL Intravenous Q12H  . spironolactone  25 mg Oral Daily  . torsemide  40 mg Oral BID    Vital Signs    Vitals:   02/09/20 0345 02/09/20 0450 02/09/20 0532 02/09/20 0744  BP: (!) 171/71 (!) 181/74  (!) 187/83  Pulse: 67 68  76  Resp: _0 Temp: 97.8 F (36.6 C) 97.7 F (36.5 C)  97.7 F (36.5 C)  TempSrc: Oral   Oral  SpO2: 96% 97%  95%  Weight:   120.5 kg   Height:         Intake/Output Summary (Last 24 hours) at 02/09/2020 1016 Last data filed at 02/09/2020 0744 Gross per 24 hour  Intake 100 ml  Output 1900 ml  Net -1800 ml   Filed Weights   02/07/20 0438 02/08/20 0413 02/09/20 0532  Weight: 120.6 kg 122.2 kg 120.5 kg    Physical Exam    GEN: Well nourished, well developed, in no acute distress.  HEENT: normal.  Neck: Supple, no JVD, carotid bruits, or masses. Cardiac: RRR, no murmurs, rubs, or gallops. 2+ edema Radials/DP/PT 2+ and equal bilaterally.  Respiratory:  Respirations regular and unlabored, scattered rhonchi bilaterally GI: Soft, nontender, nondistended, BS + x 4. MS: no deformity or atrophy. Skin: warm and dry, no rash. Neuro:  Strength and sensation are intact. Psych: Normal affect.  Labs    CBC Recent Labs    02/08/20 0645 02/09/20 0500  WBC 7.4 8.4  HGB 9.3* 9.5*  HCT 30.6* 31.1*  MCV 90.8 91.5  PLT 171 469   Basic Metabolic Panel Recent Labs    02/08/20 0645 02/09/20 0500  NA 141 141  K 3.8 3.7  CL 98 97*  CO2 36* 34*  GLUCOSE 190* 169*  BUN 24* 26*  CREATININE 1.47* 1.69*  CALCIUM 8.2* 8.0*   Liver Function Tests No results for  input(s): AST, ALT, ALKPHOS, BILITOT, PROT, ALBUMIN in the last 72 hours. No results for input(s): LIPASE, AMYLASE in the last 72 hours. Cardiac Enzymes No results for input(s): CKTOTAL, CKMB, CKMBINDEX, TROPONINI in the last 72 hours. BNP No results for input(s): BNP in the last 72 hours. D-Dimer No results for input(s): DDIMER in the last 72 hours. Hemoglobin A1C No results for input(s): HGBA1C in the last 72 hours. Fasting Lipid Panel No results for input(s): CHOL, HDL, LDLCALC, TRIG, CHOLHDL, LDLDIRECT in the last 72 hours. Thyroid Function Tests No results for input(s): TSH, T4TOTAL, T3FREE, THYROIDAB in the last 72 hours.  Invalid input(s): FREET3  Telemetry    nsr  ECG    nsr  Radiology    DG Chest 1 View  Result Date: 02/05/2020 CLINICAL DATA:   Shortness of breath EXAM: CHEST  1 VIEW COMPARISON:  02/04/2020, 02/02/2020, 01/27/2020 FINDINGS: Post sternotomy changes. Right upper extremity central venous catheter tip projects over the brachiocephalic region. Cardiomegaly with vascular congestion and diffuse bilateral interstitial and ground-glass opacity most likely reflecting pulmonary edema. Bilateral effusions and basilar consolidation without change. Aortic atherosclerosis. No pneumothorax. IMPRESSION: No significant interval change in appearance of the chest with cardiomegaly, vascular congestion, pulmonary edema and bilateral effusions with basilar consolidation. Electronically Signed   By: Donavan Foil M.D.   On: 02/05/2020 02:12   DG Chest 1 View  Result Date: 02/04/2020 CLINICAL DATA:  Shortness of breath EXAM: CHEST  1 VIEW COMPARISON:  02/02/2020 FINDINGS: Cardiomegaly. Prior CABG. Diffuse interstitial and alveolar opacities throughout the lungs, similar to prior study. More consolidated appearance in the left lower lobe, similar to prior study. Some improvement in aeration at the right lung base. Small bilateral effusions. No acute bony abnormality. IMPRESSION: Interstitial and airspace opacities diffusely likely reflect edema/CHF. More confluent opacity at the left lung base could reflect pneumonia. Small effusions. Electronically Signed   By: Rolm Baptise M.D.   On: 02/04/2020 10:41   DG Chest 2 View  Result Date: 01/10/2020 CLINICAL DATA:  Weakness EXAM: CHEST - 2 VIEW COMPARISON:  06/26/2019 FINDINGS: Hazy interstitial opacities with a mid to lower lung predominance with some slight vascular cephalization/congestion. No pneumothorax. No effusion. No focal consolidation. Cardiomegaly appearance is similar to priors with evidence of prior sternotomy and CABG and a calcified, tortuous aorta. Cardiomediastinal contours are otherwise unremarkable. No acute osseous or soft tissue abnormality. Degenerative changes are present in the imaged  spine and shoulders. IMPRESSION: Features of CHF/volume overload with cardiomegaly and findings suggesting mild interstitial pulmonary edema. Electronically Signed   By: Lovena Le M.D.   On: 01/10/2020 19:19   DG Knee 1-2 Views Left  Result Date: 01/11/2020 CLINICAL DATA:  Bilateral knee replacements.  Pain and swelling. EXAM: LEFT KNEE - 1-2 VIEW COMPARISON:  None. FINDINGS: Left total knee arthroplasty without perihardware lucency. Intermediate sized suprapatellar joint effusion. No evidence of arthropathy or other focal bone abnormality. Soft tissues are unremarkable. IMPRESSION: 1. Intermediate sized suprapatellar joint effusion. 2. Uncomplicated left total knee arthroplasty. Electronically Signed   By: Ulyses Jarred M.D.   On: 01/11/2020 04:34   DG Knee 1-2 Views Right  Result Date: 01/11/2020 CLINICAL DATA:  Knee replacements.  Swelling and pain. EXAM: RIGHT KNEE - 1-2 VIEW COMPARISON:  None. FINDINGS: There is a right total knee arthroplasty. No perihardware lucency. No evidence of arthropathy or other focal bone abnormality. Soft tissues are unremarkable. 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 Electronically Signed   By: Lennette Bihari  [place] M.D.   On: 01/11/2020 04:35   US RENAL  Result Date: 01/27/2020 CLINICAL DATA:  Acute kidney injury.  Chronic kidney disease. EXAM: RENAL / URINARY TRACT ULTRASOUND COMPLETE COMPARISON:  Abdomen and pelvis CT dated 09/05/2016. FINDINGS: Right Kidney: Renal measurements: 10.8 x 6.1 x 5.9 cm = volume: 201 mL. Echogenicity within normal limits. No mass or hydronephrosis visualized. Left Kidney: Renal measurements: 12.2 x 6.8 x 5.8 cm = volume: 228 mL. Echogenicity within normal limits. No mass or hydronephrosis visualized. Bladder: Not distended, with a Foley catheter in place. Other: None. IMPRESSION: Normal examination. Electronically Signed   By: Claudie Revering M.D.   On: 01/27/2020 17:15   DG Chest Port 1 View  Result Date:  02/02/2020 CLINICAL DATA:  Shortness of breath, cough and edema EXAM: PORTABLE CHEST 1 VIEW COMPARISON:  Radiograph 01/27/2020 FINDINGS: Right upper extremity central venous catheter tip terminates at the expected location of the right brachiocephalic-superior caval confluence. Unchanged in position from comparison. Telemetry leads and nasal cannula overlie the chest. Evidence of prior sternotomy and likely CABG with fracture of the second superior most sternal suture. Prominence of the cardiac silhouette likely reflecting some mild cardiomegaly though possibly accentuated by low volumes and portable technique. Increasing interstitial and patchy opacities, indistinct pulmonary vascularity as well as some septal thickening and bilateral pleural effusions. No pneumothorax. More consolidative opacity is present in the right lung base as well. No acute osseous or soft tissue abnormality. Degenerative changes are present in the imaged spine and shoulders. IMPRESSION: 1. Features compatible with CHF demonstrating worsening pulmonary edema and new small bilateral pleural effusions. 2. More consolidative opacity in the right lung base could reflect developing infection, alveolar edema, and/or atelectasis. 3. Right upper extremity central venous catheter tip terminates at the expected location of the right brachiocephalic-superior caval confluence. Electronically Signed   By: Lovena Le M.D.   On: 02/02/2020 19:37   DG Chest Portable 1 View  Result Date: 01/27/2020 CLINICAL DATA:  Shortness of breath. History of diabetes, atrial fibrillation and hypertension. EXAM: PORTABLE CHEST 1 VIEW COMPARISON:  Radiographs 01/10/2020 and 06/26/2019. FINDINGS: 0814 hours. Two views obtained. There is stable cardiac enlargement post median sternotomy and CABG. There is aortic atherosclerosis. There is new mild pulmonary edema and probable small bilateral pleural effusions. No confluent airspace opacity or pneumothorax. Apparent right  arm PICC not visualized beyond the level of the right sternoclavicular joint, likely in the right brachiocephalic vein. No acute osseous findings. IMPRESSION: 1. New mild pulmonary edema and probable small bilateral pleural effusions consistent with congestive heart failure. 2. Apparent right arm PICC tip is likely in the right brachiocephalic vein. Electronically Signed   By: Richardean Sale M.D.   On: 01/27/2020 08:24   ECHOCARDIOGRAM COMPLETE  Result Date: 01/11/2020    ECHOCARDIOGRAM REPORT   Patient Name:   JOHNATHON OLDEN Date of Exam: 01/11/2020 Medical Rec #:  [id number]     Height:       70.0 in Accession #:    [id number]    Weight:       240.0 lb Date of Birth:  10/07/1940      BSA:          2.255 m Patient Age:    24 years      BP:           134/48 mmHg Patient Gender: M             HR:  74 bpm. Exam Location:  [facility] Procedure: 2D Echo, Cardiac Doppler and Color Doppler Indications:     CHF- acute diastolic 831.51  History:         Patient has no prior history of Echocardiogram examinations.                  Arrythmias:Atrial Fibrillation; Risk Factors:Hypertension,                  Diabetes and Dyslipidemia.  Sonographer:     Sherrie Sport RDCS (AE) Referring Phys:  [id number] Athena Masse Diagnosing Phys: Kathlyn Sacramento MD  Sonographer Comments: No apical window. IMPRESSIONS  1. Left ventricular ejection fraction, by estimation, is 50 to 55%. The left ventricle has low normal function. Left ventricular endocardial border not optimally defined to evaluate regional wall motion. Left ventricular diastolic function could not be evaluated.  2. Right ventricular systolic function is normal. The right ventricular size is normal. Tricuspid regurgitation signal is inadequate for assessing PA pressure.  3. Left atrial size was mildly dilated.  4. The mitral valve is normal in structure. No evidence of mitral valve regurgitation. No evidence of mitral stenosis.  5. The aortic valve is normal in structure.  Aortic valve regurgitation is not visualized. Mild aortic valve sclerosis is present, with no evidence of aortic valve stenosis.  6. Mildly dilated pulmonary artery.  7. The inferior vena cava is normal in size with greater than 50% respiratory variability, suggesting right atrial pressure of 3 mmHg. FINDINGS  Left Ventricle: Left ventricular ejection fraction, by estimation, is 50 to 55%. The left ventricle has low normal function. Left ventricular endocardial border not optimally defined to evaluate regional wall motion. The left ventricular internal cavity  size was normal in size. There is borderline left ventricular hypertrophy. Left ventricular diastolic function could not be evaluated. Right Ventricle: The right ventricular size is normal. No increase in right ventricular wall thickness. Right ventricular systolic function is normal. Tricuspid regurgitation signal is inadequate for assessing PA pressure. The tricuspid regurgitant velocity is 2.67 m/s, and with an assumed right atrial pressure of 5 mmHg, the estimated right ventricular systolic pressure is 71.0 mmHg. Left Atrium: Left atrial size was mildly dilated. Right Atrium: Right atrial size was normal in size. Pericardium: There is no evidence of pericardial effusion. Mitral Valve: The mitral valve is normal in structure. Normal mobility of the mitral valve leaflets. Mild mitral annular calcification. No evidence of mitral valve regurgitation. No evidence of mitral valve stenosis. Tricuspid Valve: The tricuspid valve is normal in structure. Tricuspid valve regurgitation is trivial. No evidence of tricuspid stenosis. Aortic Valve: The aortic valve is normal in structure. Aortic valve regurgitation is not visualized. Mild aortic valve sclerosis is present, with no evidence of aortic valve stenosis. Pulmonic Valve: The pulmonic valve was normal in structure. Pulmonic valve regurgitation is trivial. No evidence of pulmonic stenosis. Aorta: The aortic root  is normal in size and structure. Pulmonary Artery: The pulmonary artery is mildly dilated. Venous: The inferior vena cava is normal in size with greater than 50% respiratory variability, suggesting right atrial pressure of 3 mmHg. IAS/Shunts: No atrial level shunt detected by color flow Doppler.  LEFT VENTRICLE PLAX 2D LVIDd:         5.30 cm LVIDs:         3.95 cm LV PW:         0.68 cm LV IVS:        1.77 cm LVOT diam:  2.00 cm LVOT Area:     3.14 cm  LEFT ATRIUM         Index LA diam:    4.70 cm 2.08 cm/m                        PULMONIC VALVE AORTA                 PV Vmax:        0.73 m/s Ao Root diam: 3.20 cm PV Peak grad:   2.1 mmHg                       RVOT Peak grad: 7 mmHg  TRICUSPID VALVE TR Peak grad:   28.5 mmHg TR Vmax:        267.00 cm/s  SHUNTS Systemic Diam: 2.00 cm Kathlyn Sacramento MD Electronically signed by Kathlyn Sacramento MD Signature Date/Time: 01/11/2020/5:11:53 PM    Final    [place] EKG SITE RITE  Result Date: 01/20/2020 If Site Rite image not attached, placement could not be confirmed due to current cardiac rhythm.   Assessment & Plan    Principal Problem: Acute exacerbation of CHF (congestive heart failure) (HCC) Active Problems: CAD, multiple vessel Atrial fibrillation status post cardioversion 10/22/[facility]) Essential hypertension DM (diabetes mellitus), type 2 (HCC) Symptomatic anemia Suspect Septic arthritis (HCC) Chronic kidney disease, stage 3a A-fib (HCC) CHF exacerbation ([place])   1.Acute on chronic diastolic CHF, recently discharged 02/01/2020 for the same, with recurrent weight gain, shortness of breath, peripheral edema, and orthopnea. BNP 1387, troponin flat. Chest xray with pulmonary edema and small bilateral effusions, with no interval changes on chest xray. Receiving IV Lasix and spironolactone. Symptoms improving status post blood transfusionsand diuresis. Complicated by anemia and CKD stage III. Unlikely caused by ischemia as troponin is  flat, ECG is without evidence of ischemia, and patient denies chest pain.  2. Atrial fibrillation, status post cardioversion 10/2019, currently in sinus rhythm, on amiodarone for rhythm control, diltiazem and carvedilol for rate control, not anticoagulated due to anemia and recent history of GI bleed.  3. Anemia,hemoglobin dropped from 8.4 to 7.0 eight daysprior, status post blood transfusions.Hemoglobin 9.5today  4. CKD stage III, creatinineslightly worse at1.69, BUN26, GFR 38 . 5. Morbid obesity,BMI 41  6. 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 He still has lower extremety edema but has diuresed well on torsemide po. WIll decrease to torsemide 20 mg bid and will need met b early next week to follow up. ALso should be discharged on amiodarone 200 daily, carvedilol 3.125 mg bid, diltiazem 120 mg daily, spironolactone 25 mg daily,  Pt desires home nebulizer if this can also be ordered. Follow up with his pcp and cardilogist in [facility].   7. Coronary artery disease,status post CABG, without chest pain.  8. Bradycardia, asymptomatic, while on Cardizem CD 180 mg and carvedilol 6.25 mg BID  9. Hypertension-Blood pressure elevated today. Will need to attempt to resume ramipril when creatinine improves.   Recommendations  1.Agree with current therapy 2.Continue amiodarone at 200 mg for rate and rhythm control 3.Continue carvedilol and diltiazem at 3.125 mg bid and 120 mg daily respectively 4.Defer chronic anticoagulation due to recent GI bleed and anemia 5.Continue diuresis but with worsening renal function, will need to reduce torsemide to 20 mg bid.  6.Carefully monitor renal status 7.Monitor daily weights and I&O's  8.Defer further cardiac diagnostics at this time 9.Follow-up with Dr. Gabrielle Dare after discharge   Signed, Javier Docker. Arion Morgan MD 02/09/2020,  10:16 AM  Pager: [phone]

## 2020-02-09 NOTE — Plan of Care (Deleted)
  Problem: Clinical Measurements: Goal: Will remain free from infection Outcome: Progressing   Problem: Clinical Measurements: Goal: Respiratory complications will improve 02/09/2020 0109 by Myles Gip, RN Outcome: Progressing 02/09/2020 0100 by Myles Gip, RN Outcome: Progressing   Problem: Safety: Goal: Ability to remain free from injury will improve 02/09/2020 0109 by Myles Gip, RN Outcome: Progressing 02/09/2020 0100 by Myles Gip, RN Outcome: Progressing

## 2020-02-09 NOTE — Progress Notes (Addendum)
Pt BP at 181/74 MAP 106 HR 68. Res of pt vitals were stable and pt was asymptomatic. Notify [facility] NP. Will continue to monitor.  Update 0532: Talked to [facility] NP but no new order was place. Notify incoming shift. Will continue to monitor.

## 2020-02-09 NOTE — Discharge Instructions (Signed)
Pulmonary Edema Pulmonary edema is unusual buildup of fluid in the lungs. It makes it hard for a person to breathe. This condition is an emergency. Follow these instructions at home: Medicines  Take over-the-counter and prescription medicines only as told by your doctor.  If you were prescribed an antibiotic medicine, take it as told by your doctor. Do not stop taking the antibiotic even if you start to feel better.  Ask your doctor to help you write a plan with information about each medicine you take. This may include: ? Why you are taking it. ? Possible side effects. ? Best time of day to take it. ? Foods to take with it, or foods to avoid. ? When to stop taking it.  Make a list of each medicine, vitamin, or herbal supplement you take. ? Keep the list with you at all times. ? Show the list to your doctor at each visit and before starting a new medicine. ? Keep the list up to date. Lifestyle   Do regular exercise as told by your doctor. It is important to do it safely. You can do this by: ? Asking your doctor what exercises and activities are good and safe for you to do. ? Pacing your activities. This will prevent shortness of breath or chest pain. ? Resting for at least 1 hour before and after meals. ? Asking about cardiac rehabilitation programs. These may include:  Education.  Exercise plans.  Counseling.  Eat a heart-healthy diet that is low in salt, saturated fat, and cholesterol. Your doctor may suggest foods that are high in fiber, such as: ? Fresh fruits and vegetables. ? Whole grains. ? Beans.  Do not use any products that contain nicotine or tobacco, such as cigarettes and e-cigarettes. If you need help quitting, ask your doctor. General instructions  Keep a record of your weight. ? Record your hospital or clinic weight. When you get home, compare it to your scale and record your weight. ? Weigh yourself first thing in the morning each day, and record the  weights. You should weigh yourself every morning after you pee and before you eat breakfast. Wear the same amount of clothing each time you weigh yourself. ? Share your weight record with your doctor. These can help your doctor see if your body is holding extra fluid. ? Tell your doctor right away if you have gained weight quickly, or if you have gained weight as told by your doctor. Your medicines may need to be adjusted.  Check your blood pressure as often as told by your doctor. ? Buy a home blood pressure cuff at your drugstore. ? Record your blood pressure readings. Bring them with you for your clinic visits.  Stay at a healthy weight. Ask your doctor what weight is healthy for you.  Think about doing therapy or being a part of a support group.  Keep all follow-up visits as told by your doctor. This is important. Contact a doctor if:  You have questions about your medicines.  You miss a dose of your medicine. Get help right away if:  You have very bad chest pain, especially if the pain is crushing or pressure-like and spreads to the arms, back, neck, or jaw.  You have more swelling in your hands, feet, ankles, or belly (abdomen).  You feel sick to your stomach (nauseous).  You have strange sweating.  Your skin turns blue or pale.  Your shortness of breath gets worse.  You feel dizzy or   unsteady.  Your vision is blurry.  You have a headache.  You cough up bloody split.  You cannot sleep because it is hard to breathe.  You start to feel a "jumping" or "fluttering" sensation (palpitations) in the chest that is unusual for you.  You feel like you cannot get enough air.  You gain weight rapidly. These symptoms may be an emergency. Do not wait to see if the symptoms will go away. Get medical help right away. Call your local emergency services (911 in the [place] Do not drive yourself to the hospital. Summary  Pulmonary edema is unusual fluid buildup in the lungs that  can make it hard to breathe.  This condition is an emergency.  Keep a record of your weight. Call your doctor if you gain weight rapidly. This information is not intended to replace advice given to you by your health care provider. Make sure you discuss any questions you have with your health care provider. Document Revised: 04/21/2017 Document Reviewed: 08/09/2016 Elsevier Patient Education  2020 [employer].   Shortness of Breath, Adult Shortness of breath means you have trouble breathing. Shortness of breath could be a sign of a medical problem. Follow these instructions at home:   Watch for any changes in your symptoms.  Do not use any products that contain nicotine or tobacco, such as cigarettes, e-cigarettes, and chewing tobacco.  Do not smoke. Smoking can cause shortness of breath. If you need help to quit smoking, ask your doctor.  Avoid things that can make it harder to breathe, such as: ? Mold. ? Dust. ? Air pollution. ? Chemical smells. ? Things that can cause allergy symptoms (allergens), if you have allergies.  Keep your living space clean. Use products that help remove mold and dust.  Rest as needed. Slowly return to your normal activities.  Take over-the-counter and prescription medicines only as told by your doctor. This includes oxygen therapy and inhaled medicines.  Keep all follow-up visits as told by your doctor. This is important. Contact a doctor if:  Your condition does not get better as soon as expected.  You have a hard time doing your normal activities, even after you rest.  You have new symptoms. Get help right away if:  Your shortness of breath gets worse.  You have trouble breathing when you are resting.  You feel light-headed or you pass out (faint).  You have a cough that is not helped by medicines.  You cough up blood.  You have pain with breathing.  You have pain in your chest, arms, shoulders, or belly (abdomen).  You have  a fever.  You cannot walk up stairs.  You cannot exercise the way you normally do. These symptoms may represent a serious problem that is an emergency. Do not wait to see if the symptoms will go away. Get medical help right away. Call your local emergency services (911 in the [place] Do not drive yourself to the hospital. Summary  Shortness of breath is when you have trouble breathing enough air. It can be a sign of a medical problem.  Avoid things that make it hard for you to breathe, such as smoking, pollution, mold, and dust.  Watch for any changes in your symptoms. Contact your doctor if you do not get better or you get worse. This information is not intended to replace advice given to you by your health care provider. Make sure you discuss any questions you have with your health care provider. Document  Revised: 10/09/2017 Document Reviewed: 10/09/2017 Elsevier Patient Education  2020 [employer].

## 2020-02-10 ENCOUNTER — Telehealth: Payer: Self-pay

## 2020-02-10 DIAGNOSIS — H5440 Blindness, one eye, unspecified eye: Secondary | ICD-10-CM | POA: Insufficient documentation

## 2020-02-10 DIAGNOSIS — I509 Heart failure, unspecified: Secondary | ICD-10-CM | POA: Insufficient documentation

## 2020-02-10 NOTE — TOC Transition Note (Addendum)
Transition of Care [facility]) - CM/SW Discharge Note   Patient Details  Name: Randall Vincent MRN: [id number] Date of Birth: Oct 22, 1940  [facility] CM/SW Contact:  Shawn Route, RN Phone Number: 02/10/2020, 9:03 AM   Clinical Narrative:      Patient discharged home with son.  DME delivered by [employer].  Home health nursing set up by [facility].  PT and OT has been set up through [facility].  Called son Renae Fickle and verified equipment has been delivered.      Final next level of care: Home w Home Health Services Barriers to Discharge: Continued Medical Work up   Patient Goals and CMS Choice Patient states their goals for this hospitalization and ongoing recovery are:: to go home with son to rehab CMS Medicare.gov Compare Post Acute Care list provided to:: Patient Choice offered to / list presented to : Patient  Discharge Placement                       Discharge Plan and Services     Post Acute Care Choice: Home Health              Date DME Agency Contacted: 02/07/20 Time DME Agency Contacted: 1318 Representative spoke with at DME Agency: Vaughan Basta HH Arranged: PT, OT, RN   Date HH Agency Contacted: 02/06/20 Time HH Agency Contacted: [phone] Representative spoke with at [facility] Agency: Jeri Modena  Social Determinants of Health (SDOH) Interventions     Readmission Risk Interventions No flowsheet data found.

## 2020-02-10 NOTE — Telephone Encounter (Signed)
Patient discharged home with son.  DME delivered by [employer].  Home health nursing set up by [facility].  PT and OT has been set up through [facility].  Called son Renae Fickle and verified equipment has been delivered.

## 2020-02-11 ENCOUNTER — Telehealth (INDEPENDENT_AMBULATORY_CARE_PROVIDER_SITE_OTHER): Payer: Medicare Other | Admitting: Gastroenterology

## 2020-02-11 ENCOUNTER — Encounter: Payer: Self-pay | Admitting: Gastroenterology

## 2020-02-11 DIAGNOSIS — K253 Acute gastric ulcer without hemorrhage or perforation: Secondary | ICD-10-CM | POA: Diagnosis not present

## 2020-02-12 ENCOUNTER — Ambulatory Visit: Payer: Medicare Other | Admitting: Family

## 2020-02-12 NOTE — Progress Notes (Signed)
Randall Antigua, MD [address]  [place]  [place], [place] [address]  Main: [phone]  Fax: [phone]   Primary Care Physician: Dionne Bucy., MD  Virtual Visit via Telephone Note  I connected with patient on 02/12/20 at  1:30 PM EDT by telephone and verified that I am speaking with the correct person using two identifiers.   I discussed the limitations, risks, security and privacy concerns of performing an evaluation and management service by telephone and the availability of in person appointments. I also discussed with the patient that there may be a patient responsible charge related to this service. The patient expressed understanding and agreed to proceed.  Location of Patient: Home Location of Provider: Home Persons involved: Patient and provider only during the visit (nursing staff and front desk staff was involved in communicating with the patient prior to the appointment, reviewing medications and checking them in)   History of Present Illness: Chief Complaint  Patient presents with  . Melena    Patient stated that he no longer has melena.     HPI: Randall Vincent is a 79 y.o. male recently seen in the hospital for melena.  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  Biopsies not done due to recent melena and anemia during that visit.  Colonoscopy showed previous anastomosis site, diverticulosis, otherwise normal.  Since his hospitalization for this, patient has also been admitted for fluid overload and is now home.  Denies any melena.  No abdominal pain.  No nausea or vomiting.  Current Outpatient Medications  Medication Sig Dispense Refill  . albuterol (VENTOLIN HFA) 108 (90 Base) MCG/ACT inhaler Inhale 2 puffs into the lungs every 6 (six) hours as needed for wheezing.    [name] amiodarone (PACERONE) 200 MG tablet Take 200 mg by mouth daily.    [name] aspirin EC 81 MG tablet Take  1 tablet (81 mg total) by mouth daily. Swallow whole.    [name] atorvastatin (LIPITOR) 40 MG tablet Take 40 mg by mouth every evening.     . carvedilol (COREG) 3.125 MG tablet Take 1 tablet (3.125 mg total) by mouth 2 (two) times daily with a meal. 60 tablet 1  . diltiazem (CARDIZEM CD) 120 MG 24 hr capsule Take 1 capsule (120 mg total) by mouth daily. 30 capsule 1  . ipratropium-albuterol (DUONEB) 0.5-2.5 (3) MG/3ML SOLN Take 3 mLs by nebulization every 6 (six) hours as needed. 360 mL 0  . mupirocin ointment (BACTROBAN) 2 % Apply 1 application topically daily as needed (leg sores).     . Nebulizers (COMPRESSOR/NEBULIZER) MISC 1 Units by Does not apply route as directed. 1 each 0  . penicillin G IVPB Inject 24 Million Units into the vein continuous for 15 days. PCN G 24 million units q24h as continuous infusion Indication:  Enterococcal knee PJI First Dose: Yes Last Day of Therapy: 02/22/2020 Labs - Once weekly:  CBC/d, CMP, ESR Method of administration: Elastomeric (Continuous infusion) Method of administration may be changed at the discretion of home infusion pharmacist based upon assessment of the patient and/or caregiver's ability to self-administer the medication ordered. 16 Units 0  . ramipril (ALTACE) 10 MG capsule Take 1 capsule (10 mg total) by mouth daily. 30 capsule 1  . Semaglutide, 1 MG/DOSE, 2 MG/1.5ML SOPN Inject 1 mg into the skin every Thursday.    [name] spironolactone (ALDACTONE) 25 MG tablet Take 25 mg by mouth daily.    [name]  torsemide (DEMADEX) 20 MG tablet Take 1 tablet (20 mg total) by mouth 2 (two) times daily. 60 tablet 1  . traMADol (ULTRAM) 50 MG tablet Take 1 tablet (50 mg total) by mouth every 8 (eight) hours as needed for moderate pain or severe pain. 10 tablet 0  . vitamin B-12 (CYANOCOBALAMIN) 1000 MCG tablet Take 1,000 mcg by mouth daily.    [name] acetaminophen (TYLENOL) 500 MG tablet Take 500-1,000 mg by mouth every 6 (six) hours as needed for mild pain or fever. (Patient not  taking: Reported on 02/11/2020)    . fluticasone (FLONASE) 50 MCG/ACT nasal spray Place 2 sprays into both nostrils daily as needed for allergies or rhinitis. (Patient not taking: Reported on 02/11/2020)    . guaiFENesin (MUCINEX) 600 MG 12 hr tablet Take 1 tablet (600 mg total) by mouth 2 (two) times daily as needed for to loosen phlegm. (Patient not taking: Reported on 02/11/2020) 10 tablet 0   No current facility-administered medications for this visit.    Allergies as of 02/11/2020  . (No Known Allergies)    Review of Systems:    All systems reviewed and negative except where noted in HPI.   Observations/Objective:  Labs: CMP     Component Value Date/Time   NA 141 02/09/2020 0500   K 3.7 02/09/2020 0500   CL 97 (L) 02/09/2020 0500   CO2 34 (H) 02/09/2020 0500   GLUCOSE 169 (H) 02/09/2020 0500   BUN 26 (H) 02/09/2020 0500   CREATININE 1.69 (H) 02/09/2020 0500   CALCIUM 8.0 (L) 02/09/2020 0500   PROT 5.5 (L) 02/05/2020 0435   ALBUMIN 2.5 (L) 02/05/2020 0435   AST 22 02/05/2020 0435   ALT 27 02/05/2020 0435   ALKPHOS 101 02/05/2020 0435   BILITOT 0.8 02/05/2020 0435   GFRNONAA 38 (L) 02/09/2020 0500   GFRAA 44 (L) 02/09/2020 0500   Lab Results  Component Value Date   WBC 8.4 02/09/2020   HGB 9.5 (L) 02/09/2020   HCT 31.1 (L) 02/09/2020   MCV 91.5 02/09/2020   PLT 182 02/09/2020    Imaging Studies: DG Chest 1 View  Result Date: 02/05/2020 CLINICAL DATA:  Shortness of breath EXAM: CHEST  1 VIEW COMPARISON:  02/04/2020, 02/02/2020, 01/27/2020 FINDINGS: Post sternotomy changes. Right upper extremity central venous catheter tip projects over the brachiocephalic region. Cardiomegaly with vascular congestion and diffuse bilateral interstitial and ground-glass opacity most likely reflecting pulmonary edema. Bilateral effusions and basilar consolidation without change. Aortic atherosclerosis. No pneumothorax. IMPRESSION: No significant interval change in appearance of the chest  with cardiomegaly, vascular congestion, pulmonary edema and bilateral effusions with basilar consolidation. Electronically Signed   By: Donavan Foil M.D.   On: 02/05/2020 02:12   DG Chest 1 View  Result Date: 02/04/2020 CLINICAL DATA:  Shortness of breath EXAM: CHEST  1 VIEW COMPARISON:  02/02/2020 FINDINGS: Cardiomegaly. Prior CABG. Diffuse interstitial and alveolar opacities throughout the lungs, similar to prior study. More consolidated appearance in the left lower lobe, similar to prior study. Some improvement in aeration at the right lung base. Small bilateral effusions. No acute bony abnormality. IMPRESSION: Interstitial and airspace opacities diffusely likely reflect edema/CHF. More confluent opacity at the left lung base could reflect pneumonia. Small effusions. Electronically Signed   By: Rolm Baptise M.D.   On: 02/04/2020 10:41   US RENAL  Result Date: 01/27/2020 CLINICAL DATA:  Acute kidney injury.  Chronic kidney disease. EXAM: RENAL / URINARY TRACT ULTRASOUND COMPLETE COMPARISON:  Abdomen and pelvis  CT dated 09/05/2016. FINDINGS: Right Kidney: Renal measurements: 10.8 x 6.1 x 5.9 cm = volume: 201 mL. Echogenicity within normal limits. No mass or hydronephrosis visualized. Left Kidney: Renal measurements: 12.2 x 6.8 x 5.8 cm = volume: 228 mL. Echogenicity within normal limits. No mass or hydronephrosis visualized. Bladder: Not distended, with a Foley catheter in place. Other: None. IMPRESSION: Normal examination. Electronically Signed   By: Claudie Revering M.D.   On: 01/27/2020 17:15   DG Chest Port 1 View  Result Date: 02/02/2020 CLINICAL DATA:  Shortness of breath, cough and edema EXAM: PORTABLE CHEST 1 VIEW COMPARISON:  Radiograph 01/27/2020 FINDINGS: Right upper extremity central venous catheter tip terminates at the expected location of the right brachiocephalic-superior caval confluence. Unchanged in position from comparison. Telemetry leads and nasal cannula overlie the chest. Evidence of  prior sternotomy and likely CABG with fracture of the second superior most sternal suture. Prominence of the cardiac silhouette likely reflecting some mild cardiomegaly though possibly accentuated by low volumes and portable technique. Increasing interstitial and patchy opacities, indistinct pulmonary vascularity as well as some septal thickening and bilateral pleural effusions. No pneumothorax. More consolidative opacity is present in the right lung base as well. No acute osseous or soft tissue abnormality. Degenerative changes are present in the imaged spine and shoulders. IMPRESSION: 1. Features compatible with CHF demonstrating worsening pulmonary edema and new small bilateral pleural effusions. 2. More consolidative opacity in the right lung base could reflect developing infection, alveolar edema, and/or atelectasis. 3. Right upper extremity central venous catheter tip terminates at the expected location of the right brachiocephalic-superior caval confluence. Electronically Signed   By: Lovena Le M.D.   On: 02/02/2020 19:37   DG Chest Portable 1 View  Result Date: 01/27/2020 CLINICAL DATA:  Shortness of breath. History of diabetes, atrial fibrillation and hypertension. EXAM: PORTABLE CHEST 1 VIEW COMPARISON:  Radiographs 01/10/2020 and 06/26/2019. FINDINGS: 0814 hours. Two views obtained. There is stable cardiac enlargement post median sternotomy and CABG. There is aortic atherosclerosis. There is new mild pulmonary edema and probable small bilateral pleural effusions. No confluent airspace opacity or pneumothorax. Apparent right arm PICC not visualized beyond the level of the right sternoclavicular joint, likely in the right brachiocephalic vein. No acute osseous findings. IMPRESSION: 1. New mild pulmonary edema and probable small bilateral pleural effusions consistent with congestive heart failure. 2. Apparent right arm PICC tip is likely in the right brachiocephalic vein. Electronically Signed   By:  Richardean Sale M.D.   On: 01/27/2020 08:24   [place] EKG SITE RITE  Result Date: 01/20/2020 If Site Rite image not attached, placement could not be confirmed due to current cardiac rhythm.   Assessment and Plan:   Ameet Sandy is a 79 y.o. y/o male with recent hospitalization for melena and anemia  Assessment and Plan: Melena has completely resolved  However, we would need to ensure hemoglobin is improving.  Patient states he had blood work done last night by home nurse, that was ordered by infectious disease.  I do not see the results.  Also states he is seeing his primary care doctor soon and will have blood work done by them as well.  We will check in his chart in another week and if no up-to-date blood work results available, repeat hemoglobin at that time.  If hemoglobin not improving as expected, patient would benefit from capsule study.  We also discussed that once he is further out from his recent acute issues including recent admission for  fluid overload, upper endoscopy for salmon-colored mucosa may be beneficial to evaluate for Barrett's.  This would be best discussed in clinic after ensuring his clinical status allows for repeat procedures and discussed risks and benefits of repeat upper endoscopy and colonoscopy at that time  Patient agreeable with above plan  Follow Up Instructions:    I discussed the assessment and treatment plan with the patient. The patient was provided an opportunity to ask questions and all were answered. The patient agreed with the plan and demonstrated an understanding of the instructions.   The patient was advised to call back or seek an in-person evaluation if the symptoms worsen or if the condition fails to improve as anticipated.  I provided 15 minutes of non-face-to-face time during this encounter. Additional time was spent in reviewing patient's chart, placing orders etc.   Virgel Manifold, MD  Speech recognition software was used to  dictate this note.

## 2020-02-13 NOTE — Progress Notes (Signed)
Patient ID: Randall Vincent, male    DOB: [birth year], 79 y.o.   MRN: [id number]  HPI  Randall Vincent is a 79 y/o male with a history of DM, hyperlipidemia, HTN, atrial fibrillation, CAD, previous tobacco use and chronic heart failure.   Echo report from 01/11/20 reviewed and showed an EF of 50-55% along with mild LAE.  Admitted 02/02/20 due to shortness of breath due to HF exacerbation. Cardiology and ID consults obtained. Initially given IV lasix with transition to oral diuretics. Given 2 units of PRBC's due to anemia. Continued on oxygen. IV antibiotics given for septic arthritis of left leg. Discharged after 7 days. Admitted 01/27/20 due to acute on chronic HF along with septic arthritis of left knee. Initially given IV lasix with transition to oral diuretics. Given IV antibiotics. Discharged after 5 days.   He presents today for his initial visit with a chief complaint of minimal shortness of breath upon moderate exertion. He describes this as having been present for several years. He has associated fatigue, cough, pedal edema, easy bruising and left leg pain along with this. He denies any difficulty sleeping, dizziness, abdominal distention, palpitations, chest pain or wheezing.   Currently has PICC line in place and receiving continuous PCN infusion for septic arthritis in left leg. Currently has left leg wrapped with velcro wrap from his ankle all the way to his thigh. Unable to stand without great assist so is not able to weigh safely at home.   Past Medical History:  Diagnosis Date  . A-fib ([place])   . CHF (congestive heart failure) ([place])   . Coronary artery disease   . Diabetes ([place])   . Hyperlipidemia   . Hypertension    Past Surgical History:  Procedure Laterality Date  . BLADDER SURGERY    . BYPASS GRAFT    . CARDIOVERSION    . COLON SURGERY    . COLONOSCOPY N/A 01/15/2020   Procedure: COLONOSCOPY;  Surgeon: Virgel Manifold, MD;  Location: [facility] ENDOSCOPY;  Service: Endoscopy;   Laterality: N/A;  . COLONOSCOPY N/A 01/16/2020   Procedure: COLONOSCOPY;  Surgeon: Virgel Manifold, MD;  Location: [facility] ENDOSCOPY;  Service: Endoscopy;  Laterality: N/A;  . ESOPHAGOGASTRODUODENOSCOPY N/A 01/13/2020   Procedure: ESOPHAGOGASTRODUODENOSCOPY (EGD);  Surgeon: Virgel Manifold, MD;  Location: [facility] ENDOSCOPY;  Service: Endoscopy;  Laterality: N/A;  . REPLACEMENT TOTAL KNEE BILATERAL    . STENT PLACEMENT VASCULAR ([place] HX)    . TOTAL KNEE REVISION Left 01/16/2020   Procedure: TOTAL KNEE REVISION;  Surgeon: Lovell Sheehan, MD;  Location: [facility] ORS;  Service: Orthopedics;  Laterality: Left;   History reviewed. No pertinent family history. Social History   Tobacco Use  . Smoking status: Former Smoker    Quit date: 1991    Years since quitting: 30.7  . Smokeless tobacco: Never Used  Substance Use Topics  . Alcohol use: Not on file   No Known Allergies Prior to Admission medications   Medication Sig Start Date End Date Taking? Authorizing Provider  amiodarone (PACERONE) 200 MG tablet Take 200 mg by mouth daily. 10/29/19  Yes [provider]  atorvastatin (LIPITOR) 40 MG tablet Take 40 mg by mouth every evening.  06/18/19  Yes [provider]  carvedilol (COREG) 3.125 MG tablet Take 1 tablet (3.125 mg total) by mouth 2 (two) times daily with a meal. 02/09/20  Yes Wouk, Ailene Rud, MD  diltiazem (CARDIZEM CD) 120 MG 24 hr capsule Take 1 capsule (120 mg total) by  mouth daily. 02/10/20  Yes Wouk, Ailene Rud, MD  fluticasone [facility]) 50 MCG/ACT nasal spray Place 2 sprays into both nostrils daily as needed for allergies or rhinitis.    Yes [provider]  guaiFENesin (MUCINEX) 600 MG 12 hr tablet Take 1 tablet (600 mg total) by mouth 2 (two) times daily as needed for to loosen phlegm. 01/31/20  Yes Fritzi Mandes, MD  ipratropium-albuterol (DUONEB) 0.5-2.5 (3) MG/3ML SOLN Take 3 mLs by nebulization every 6 (six) hours as needed. 02/01/20  Yes Fritzi Mandes, MD   mupirocin ointment (BACTROBAN) 2 % Apply 1 application topically daily as needed (leg sores).  11/16/19  Yes [provider]  Nebulizers (COMPRESSOR/NEBULIZER) MISC 1 Units by Does not apply route as directed. 02/09/20  Yes Wouk, Ailene Rud, MD  penicillin G IVPB Inject 24 Million Units into the vein continuous for 15 days. PCN G 24 million units q24h as continuous infusion Indication:  Enterococcal knee PJI First Dose: Yes Last Day of Therapy: 02/22/2020 Labs - Once weekly:  CBC/d, CMP, ESR Method of administration: Elastomeric (Continuous infusion) Method of administration may be changed at the discretion of home infusion pharmacist based upon assessment of the patient and/or caregiver's ability to self-administer the medication ordered. 02/09/20 02/24/20 Yes Wouk, Ailene Rud, MD  ramipril (ALTACE) 10 MG capsule Take 1 capsule (10 mg total) by mouth daily. 01/31/20  Yes Fritzi Mandes, MD  Semaglutide, 1 MG/DOSE, 2 MG/1.5ML SOPN Inject 1 mg into the skin every Thursday.   Yes [provider]  spironolactone (ALDACTONE) 25 MG tablet Take 25 mg by mouth daily. 06/18/19  Yes [provider]  torsemide (DEMADEX) 20 MG tablet Take 1 tablet (20 mg total) by mouth 2 (two) times daily. 02/09/20  Yes Wouk, Ailene Rud, MD  traMADol (ULTRAM) 50 MG tablet Take 1 tablet (50 mg total) by mouth every 8 (eight) hours as needed for moderate pain or severe pain. 01/31/20  Yes Fritzi Mandes, MD  vitamin B-12 (CYANOCOBALAMIN) 1000 MCG tablet Take 1,000 mcg by mouth daily.   Yes [provider]    Review of Systems  Constitutional: Positive for fatigue. Negative for appetite change.  HENT: Positive for nosebleeds. Negative for congestion, postnasal drip and sore throat.   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  Respiratory: Positive for cough and shortness of breath. Negative for wheezing.   Cardiovascular: Positive for leg swelling. Negative for chest pain and  palpitations.  Gastrointestinal: Negative for abdominal distention and abdominal pain.  Endocrine: Negative.   Genitourinary: Negative.   Musculoskeletal: Positive for arthralgias (left leg). Negative for back pain.  Skin: Negative.   Allergic/Immunologic: Negative.   Neurological: Negative for dizziness and light-headedness.  Hematological: Negative for adenopathy. Bruises/bleeds easily.  Psychiatric/Behavioral: Negative for dysphoric mood and sleep disturbance (sleeping in recliner with oxygen). The patient is not nervous/anxious.    Vitals:   02/14/20 1207  BP: (!) 127/46  Pulse: (!) 58  Resp: 18  SpO2: 100%  Weight: 263 lb (119.3 kg)  Height: _0  (1.727 m)   Wt Readings from Last 3 Encounters:  02/14/20 263 lb (119.3 kg)  02/09/20 265 lb 10.5 oz (120.5 kg)  01/31/20 275 lb 1.6 oz (124.8 kg)   Lab Results  Component Value Date   CREATININE 1.69 (H) 02/09/2020   CREATININE 1.47 (H) 02/08/2020   CREATININE 1.47 (H) 02/07/2020    Physical Exam Vitals and nursing note reviewed.  Constitutional:      Appearance: He is  well-developed.  HENT:     Head: Normocephalic and atraumatic.  Cardiovascular:     Rate and Rhythm: Regular rhythm. Bradycardia present.  Pulmonary:     Effort: Pulmonary effort is normal. No respiratory distress.     Breath sounds: No wheezing or rales.  Abdominal:     Palpations: Abdomen is soft.     Tenderness: There is no abdominal tenderness.  Musculoskeletal:     Cervical back: Normal range of motion and neck supple.     Right lower leg: No tenderness. Edema (1+ pitting) present.     Left lower leg: Edema present.  Skin:    General: Skin is warm and dry.     Findings: Ecchymosis (bilateral arms) present.  Neurological:     General: No focal deficit present.     Mental Status: He is alert.  Psychiatric:        Mood and Affect: Mood normal.        Behavior: Behavior normal.    Assessment & Plan:  1: Chronic heart failure with preserved  ejection fraction with structural changes (LAE)- - NYHA class II - euvolemic today - unable to safely weigh at home as he can't stand on his legs for any extended period of time; needs great assist in transfer - keeping up with urine output and encouraged him to also measure how much fluids he drinks so we can make sure he's not getting dehydrated - wants to get established with local cardiology and asks for Dr. Bethanne Ginger number as that who saw him during recent admission; his office number was put on his AVS - has home health nursing and PT coming - BNP 02/02/20 was 1387.0 - reports receiving both covid vaccines  2: HTN- - BP looks good today - saw PCP Vertell Limber) 01/10/20 - BMP 02/09/20 reviewed and showed sodium 141, potassium 3.7, creatinine 1.69 and GFR 38  3: DM- - A1c 01/10/20 was 6.7% - glucose at home was 210  4: Atrial fibrillation- - saw cardiology ([place]) 02/12/20   5: Septic arthritis of left knee- - currently has a PICC line and getting PCN infusion until 02/22/20   Patient did not bring his medications nor a list. Each medication was verbally reviewed with the patient and he was encouraged to bring the bottles to every visit to confirm accuracy of list.  Return in 2 months or sooner for any questions/problems before then.

## 2020-02-14 ENCOUNTER — Other Ambulatory Visit: Payer: Self-pay

## 2020-02-14 ENCOUNTER — Ambulatory Visit: Payer: Medicare Other | Attending: Family | Admitting: Family

## 2020-02-14 ENCOUNTER — Encounter: Payer: Self-pay | Admitting: Family

## 2020-02-14 VITALS — BP 127/46 | HR 58 | Resp 18 | Ht 68.0 in | Wt 263.0 lb

## 2020-02-14 DIAGNOSIS — Z96653 Presence of artificial knee joint, bilateral: Secondary | ICD-10-CM | POA: Diagnosis not present

## 2020-02-14 DIAGNOSIS — D649 Anemia, unspecified: Secondary | ICD-10-CM | POA: Insufficient documentation

## 2020-02-14 DIAGNOSIS — I5032 Chronic diastolic (congestive) heart failure: Secondary | ICD-10-CM

## 2020-02-14 DIAGNOSIS — I10 Essential (primary) hypertension: Secondary | ICD-10-CM

## 2020-02-14 DIAGNOSIS — I4891 Unspecified atrial fibrillation: Secondary | ICD-10-CM | POA: Diagnosis not present

## 2020-02-14 DIAGNOSIS — Z79899 Other long term (current) drug therapy: Secondary | ICD-10-CM | POA: Diagnosis not present

## 2020-02-14 DIAGNOSIS — Z7984 Long term (current) use of oral hypoglycemic drugs: Secondary | ICD-10-CM | POA: Insufficient documentation

## 2020-02-14 DIAGNOSIS — Z7901 Long term (current) use of anticoagulants: Secondary | ICD-10-CM | POA: Insufficient documentation

## 2020-02-14 DIAGNOSIS — M009 Pyogenic arthritis, unspecified: Secondary | ICD-10-CM

## 2020-02-14 DIAGNOSIS — E1122 Type 2 diabetes mellitus with diabetic chronic kidney disease: Secondary | ICD-10-CM

## 2020-02-14 DIAGNOSIS — I110 Hypertensive heart disease with heart failure: Secondary | ICD-10-CM | POA: Insufficient documentation

## 2020-02-14 DIAGNOSIS — E119 Type 2 diabetes mellitus without complications: Secondary | ICD-10-CM | POA: Insufficient documentation

## 2020-02-14 DIAGNOSIS — E785 Hyperlipidemia, unspecified: Secondary | ICD-10-CM | POA: Insufficient documentation

## 2020-02-14 DIAGNOSIS — Z87891 Personal history of nicotine dependence: Secondary | ICD-10-CM | POA: Diagnosis not present

## 2020-02-14 DIAGNOSIS — I2510 Atherosclerotic heart disease of native coronary artery without angina pectoris: Secondary | ICD-10-CM | POA: Diagnosis not present

## 2020-02-14 NOTE — Patient Instructions (Addendum)
Call Dr. America Brown office at [phone] to schedule an appointment.

## 2020-02-18 ENCOUNTER — Telehealth: Payer: Self-pay

## 2020-02-18 ENCOUNTER — Other Ambulatory Visit: Payer: Self-pay

## 2020-02-18 ENCOUNTER — Encounter: Payer: Self-pay | Admitting: Infectious Diseases

## 2020-02-18 ENCOUNTER — Ambulatory Visit: Payer: Medicare Other | Attending: Infectious Diseases | Admitting: Infectious Diseases

## 2020-02-18 VITALS — BP 128/57 | HR 60 | Temp 98.6°F | Resp 16 | Ht 68.0 in | Wt 263.0 lb

## 2020-02-18 DIAGNOSIS — E1122 Type 2 diabetes mellitus with diabetic chronic kidney disease: Secondary | ICD-10-CM | POA: Insufficient documentation

## 2020-02-18 DIAGNOSIS — I509 Heart failure, unspecified: Secondary | ICD-10-CM | POA: Diagnosis not present

## 2020-02-18 DIAGNOSIS — B952 Enterococcus as the cause of diseases classified elsewhere: Secondary | ICD-10-CM | POA: Diagnosis not present

## 2020-02-18 DIAGNOSIS — T8454XD Infection and inflammatory reaction due to internal left knee prosthesis, subsequent encounter: Secondary | ICD-10-CM | POA: Insufficient documentation

## 2020-02-18 DIAGNOSIS — Z96653 Presence of artificial knee joint, bilateral: Secondary | ICD-10-CM | POA: Diagnosis not present

## 2020-02-18 DIAGNOSIS — X58XXXD Exposure to other specified factors, subsequent encounter: Secondary | ICD-10-CM | POA: Diagnosis not present

## 2020-02-18 DIAGNOSIS — Z951 Presence of aortocoronary bypass graft: Secondary | ICD-10-CM | POA: Insufficient documentation

## 2020-02-18 DIAGNOSIS — N189 Chronic kidney disease, unspecified: Secondary | ICD-10-CM | POA: Diagnosis not present

## 2020-02-18 DIAGNOSIS — Z09 Encounter for follow-up examination after completed treatment for conditions other than malignant neoplasm: Secondary | ICD-10-CM | POA: Diagnosis present

## 2020-02-18 DIAGNOSIS — I130 Hypertensive heart and chronic kidney disease with heart failure and stage 1 through stage 4 chronic kidney disease, or unspecified chronic kidney disease: Secondary | ICD-10-CM | POA: Diagnosis not present

## 2020-02-18 DIAGNOSIS — Z87891 Personal history of nicotine dependence: Secondary | ICD-10-CM | POA: Diagnosis not present

## 2020-02-18 DIAGNOSIS — I2510 Atherosclerotic heart disease of native coronary artery without angina pectoris: Secondary | ICD-10-CM | POA: Insufficient documentation

## 2020-02-18 DIAGNOSIS — E785 Hyperlipidemia, unspecified: Secondary | ICD-10-CM | POA: Insufficient documentation

## 2020-02-18 DIAGNOSIS — Z79899 Other long term (current) drug therapy: Secondary | ICD-10-CM | POA: Diagnosis not present

## 2020-02-18 DIAGNOSIS — I480 Paroxysmal atrial fibrillation: Secondary | ICD-10-CM | POA: Diagnosis not present

## 2020-02-18 NOTE — Telephone Encounter (Signed)
-----   Message from Pasty Spillers, MD sent at 02/18/2020  2:07 PM EDT ----- I checked his chart and he had a hemoglobin done on 02/16/2020, 2 days ago in Care Everywhere that is 8.5.  It is lower than his previous level.  I recommend small bowel capsule study at this time.  Please schedule please ensure he has follow-up with me in 6 to 8 weeks ----- Message ----- From: Pasty Spillers, MD Sent: 02/18/2020 To: Pasty Spillers, MD  Check on labs

## 2020-02-18 NOTE — Telephone Encounter (Signed)
Called patient and he stated that he had been at his infectious disease doctor and told him that his hemoglobin was good. Therefore, he stated that he was going to speak to him and his PCP in order to decide if he would schedule the capsule study as recommended by Dr. Maximino Greenland. He stated that he would call us back once he had spoken to his two doctors.

## 2020-02-18 NOTE — Progress Notes (Signed)
NAME: Randall Vincent  DOB: 08-14-1940  MRN: [id number]  Date/Time: 02/18/2020 11:23 AM   Subjective:   Follow up visit for left knee PJI  ? 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 He also had GI bleed so anticoagulation was stopped- he underwent upper and lower scope and it was essentially okay with no obvious source of bleeding  He was readmitted between 01/27/20-01/31/20 for AKI and CHF Echo showed EF of 50-55% He was readmitted on 02/02/20 with sob and found to have pulmonary edema He was discharged on 02/09/20 home to complete penicillin infusion 24 million units q 24 until 02/22/20 He is here for follow up and doing well No fever, chills, diarrhea  No sob Has lost fluid weight Pt now has a pressure sore on his left heel from the splint He has seen ortho and splint removed and has a follow up next week Past Medical History:  Diagnosis Date  . A-fib ([place])   . CHF (congestive heart failure) ([place])   . Coronary artery disease   . Diabetes ([place])   . Hyperlipidemia   . Hypertension     Past Surgical History:  Procedure Laterality Date  . BLADDER SURGERY    . BYPASS GRAFT    . CARDIOVERSION    . COLON SURGERY    . COLONOSCOPY N/A 01/15/2020   Procedure: COLONOSCOPY;  Surgeon: Virgel Manifold, MD;  Location: [facility] ENDOSCOPY;  Service: Endoscopy;  Laterality: N/A;  . COLONOSCOPY N/A 01/16/2020   Procedure: COLONOSCOPY;  Surgeon: Virgel Manifold, MD;  Location: [facility] ENDOSCOPY;  Service: Endoscopy;  Laterality: N/A;  . ESOPHAGOGASTRODUODENOSCOPY N/A 01/13/2020   Procedure: ESOPHAGOGASTRODUODENOSCOPY (EGD);  Surgeon: Virgel Manifold, MD;  Location: [facility] ENDOSCOPY;  Service: Endoscopy;  Laterality: N/A;  . REPLACEMENT TOTAL KNEE BILATERAL     . STENT PLACEMENT VASCULAR ([place] HX)    . TOTAL KNEE REVISION Left 01/16/2020   Procedure: TOTAL KNEE REVISION;  Surgeon: Lovell Sheehan, MD;  Location: [facility] ORS;  Service: Orthopedics;  Laterality: Left;    Social History   Socioeconomic History  . Marital status: Widowed    Spouse name: Not on file  . Number of children: Not on file  . Years of education: Not on file  . Highest education level: Not on file  Occupational History  . Not on file  Tobacco Use  . Smoking status: Former Smoker    Quit date: 1991    Years since quitting: 30.7  . Smokeless tobacco: Never Used  Substance and Sexual Activity  . Alcohol use: Not on file  . Drug use: Not on file  . Sexual activity: Not on file  Other Topics Concern  . Not on file  Social History Narrative  . Not on file   Social Determinants of Health   Financial Resource Strain:   . Difficulty of Paying Living Expenses: Not on file  Food Insecurity:   . Worried About Charity fundraiser in the Last Year: Not on file  . Ran Out of Food in the Last Year: Not on file  Transportation Needs:   . Lack of Transportation (Medical): Not on file  . Lack of Transportation (Non-Medical): Not on file  Physical Activity:   .  Days of Exercise per Week: Not on file  . Minutes of Exercise per Session: Not on file  Stress:   . Feeling of Stress : Not on file  Social Connections:   . Frequency of Communication with Friends and Family: Not on file  . Frequency of Social Gatherings with Friends and Family: Not on file  . Attends Religious Services: Not on file  . Active Member of Clubs or Organizations: Not on file  . Attends Archivist Meetings: Not on file  . Marital Status: Not on file  Intimate Partner Violence:   . Fear of Current or Ex-Partner: Not on file  . Emotionally Abused: Not on file  . Physically Abused: Not on file  . Sexually Abused: Not on file    No family history on file. No Known  Allergies  Fishing/hunting/animal bird exposure ? Current Outpatient Medications  Medication Sig Dispense Refill  . amiodarone (PACERONE) 200 MG tablet Take 200 mg by mouth daily.    [name] atorvastatin (LIPITOR) 40 MG tablet Take 40 mg by mouth every evening.     . carvedilol (COREG) 3.125 MG tablet Take 1 tablet (3.125 mg total) by mouth 2 (two) times daily with a meal. 60 tablet 1  . diltiazem (CARDIZEM CD) 120 MG 24 hr capsule Take 1 capsule (120 mg total) by mouth daily. 30 capsule 1  . fluticasone (FLONASE) 50 MCG/ACT nasal spray Place 2 sprays into both nostrils daily as needed for allergies or rhinitis.     [name] guaiFENesin (MUCINEX) 600 MG 12 hr tablet Take 1 tablet (600 mg total) by mouth 2 (two) times daily as needed for to loosen phlegm. 10 tablet 0  . ipratropium-albuterol (DUONEB) 0.5-2.5 (3) MG/3ML SOLN Take 3 mLs by nebulization every 6 (six) hours as needed. 360 mL 0  . mupirocin ointment (BACTROBAN) 2 % Apply 1 application topically daily as needed (leg sores).     . Nebulizers (COMPRESSOR/NEBULIZER) MISC 1 Units by Does not apply route as directed. 1 each 0  . penicillin G IVPB Inject 24 Million Units into the vein continuous for 15 days. PCN G 24 million units q24h as continuous infusion Indication:  Enterococcal knee PJI First Dose: Yes Last Day of Therapy: 02/22/2020 Labs - Once weekly:  CBC/d, CMP, ESR Method of administration: Elastomeric (Continuous infusion) Method of administration may be changed at the discretion of home infusion pharmacist based upon assessment of the patient and/or caregiver's ability to self-administer the medication ordered. 16 Units 0  . ramipril (ALTACE) 10 MG capsule Take 1 capsule (10 mg total) by mouth daily. 30 capsule 1  . Semaglutide, 1 MG/DOSE, 2 MG/1.5ML SOPN Inject 1 mg into the skin every Thursday.    [name] spironolactone (ALDACTONE) 25 MG tablet Take 25 mg by mouth daily.    [name] torsemide (DEMADEX) 20 MG tablet Take 1 tablet (20 mg total) by  mouth 2 (two) times daily. 60 tablet 1  . traMADol (ULTRAM) 50 MG tablet Take 1 tablet (50 mg total) by mouth every 8 (eight) hours as needed for moderate pain or severe pain. 10 tablet 0  . vitamin B-12 (CYANOCOBALAMIN) 1000 MCG tablet Take 1,000 mcg by mouth daily.     No current facility-administered medications for this visit.     Abtx:  Anti-infectives (From admission, onward)   None      REVIEW OF SYSTEMS:  Const: negative fever, negative chills, significant weight loss ( fluid) Eyes: left eye -divergent ENT: negative coryza, negative sore  throat Resp: negative cough, hemoptysis, dyspnea Cards: negative for chest pain, palpitations, lower extremity edema GU: negative for frequency, dysuria and hematuria GI: Negative for abdominal pain, diarrhea, bleeding, constipation Skin: negative for rash and pruritus Heme: negative for easy bruising and gum/nose bleeding MS: general weakness Neurolo:negative for headaches, dizziness, vertigo, memory problems  Psych: negative for feelings of anxiety, depression  Endocrine:has diabetes Allergy/Immunology- negative for any medication or food allergies ?  Objective:  VITALS:  BP (!) 128/57   Pulse 60   Temp 98.6 F (37 C)   Resp 16   Ht 5' 8" (1.727 m)   Wt 263 lb (119.3 kg)   SpO2 92%   BMI 39.99 kg/m PHYSICAL EXAM:  General: Alert, cooperative, no distress, appears stated age.  Head: Normocephalic, without obvious abnormality, atraumatic. Eyes: left eye divergent squint ENT Nares normal. No drainage or sinus tenderness. Lips, mucosa, and tongue normal. No Thrush Neck: Supple, symmetrical, no adenopathy, thyroid: non tender no carotid bruit and no JVD. Back: No CVA tenderness. Lungs: Clear to auscultation bilaterally. No Wheezing or Rhonchi. No rales. Heart: Regular rate and rhythm, no murmur, rub or gallop. Abdomen: Soft, non-tender,not distended. Bowel sounds normal. No masses Extremities: left knee Honey comb dressing-  steristrips underneath- looks good. No erythema    Left heel small ulcer- could not see it but reviewed picture   Rt arm PICC sit- okay Skin: No rashes or lesions. Or bruising Lymph: Cervical, supraclavicular normal. Neurologic: Grossly non-focal Pertinent Labs Lab Results CBC  Recent labs reviewed Cr 1.16 ESR 26    ? 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 Joint aspiration and culture will be done ahead of surgery  CKD -stable- last cr 1.17  Paroxysmal Afib- - underwent cardioversion June 2021. On amiodarone, carvedilol and diltiazem Off anticoagulation because of GI bleed  GI bleed- resolved - Hb around 10  CAD s/p CABG DM- on semaglutide ? ?CHF- on spirinolactone+ frusemide, ACEI ___________________________________________________ Discussed with patient and his son and with Dr.Bowers requesting provider Note:  This document was prepared using Dragon voice recognition software and may include unintentional dictation errors.

## 2020-02-18 NOTE — Patient Instructions (Signed)
You are finishing 6 weeks of Iv pencillin for the left knee prosthetic  Infection. The hardware has been removed- You complete on 02/22/20- will discuss with Dr.Bowers regarding further plan

## 2020-02-19 NOTE — Telephone Encounter (Signed)
Randall Vincent, CMA from infection disease contacted me to let me know that the patient's daughter, patient and family will not move forward with the capsule study at this time as they want to just worry about his knee problem at this time. Dr. Maximino Greenland will be notified.

## 2020-02-25 ENCOUNTER — Telehealth: Payer: Medicare Other | Admitting: Gastroenterology

## 2020-04-05 DIAGNOSIS — Z6833 Body mass index (BMI) 33.0-33.9, adult: Secondary | ICD-10-CM | POA: Insufficient documentation

## 2020-04-15 ENCOUNTER — Ambulatory Visit: Payer: Medicare Other | Attending: Family | Admitting: Family

## 2020-04-15 ENCOUNTER — Encounter: Payer: Self-pay | Admitting: Family

## 2020-04-15 ENCOUNTER — Other Ambulatory Visit: Payer: Self-pay

## 2020-04-15 VITALS — BP 154/56 | HR 65 | Resp 18 | Ht 68.0 in | Wt 241.0 lb

## 2020-04-15 DIAGNOSIS — N1832 Chronic kidney disease, stage 3b: Secondary | ICD-10-CM | POA: Insufficient documentation

## 2020-04-15 DIAGNOSIS — I4891 Unspecified atrial fibrillation: Secondary | ICD-10-CM | POA: Insufficient documentation

## 2020-04-15 DIAGNOSIS — Z87891 Personal history of nicotine dependence: Secondary | ICD-10-CM | POA: Insufficient documentation

## 2020-04-15 DIAGNOSIS — Z7984 Long term (current) use of oral hypoglycemic drugs: Secondary | ICD-10-CM | POA: Diagnosis not present

## 2020-04-15 DIAGNOSIS — E1122 Type 2 diabetes mellitus with diabetic chronic kidney disease: Secondary | ICD-10-CM | POA: Insufficient documentation

## 2020-04-15 DIAGNOSIS — Z7901 Long term (current) use of anticoagulants: Secondary | ICD-10-CM | POA: Diagnosis not present

## 2020-04-15 DIAGNOSIS — E119 Type 2 diabetes mellitus without complications: Secondary | ICD-10-CM | POA: Insufficient documentation

## 2020-04-15 DIAGNOSIS — Z79899 Other long term (current) drug therapy: Secondary | ICD-10-CM | POA: Diagnosis not present

## 2020-04-15 DIAGNOSIS — I509 Heart failure, unspecified: Secondary | ICD-10-CM | POA: Diagnosis not present

## 2020-04-15 DIAGNOSIS — I5032 Chronic diastolic (congestive) heart failure: Secondary | ICD-10-CM

## 2020-04-15 DIAGNOSIS — I130 Hypertensive heart and chronic kidney disease with heart failure and stage 1 through stage 4 chronic kidney disease, or unspecified chronic kidney disease: Secondary | ICD-10-CM | POA: Diagnosis not present

## 2020-04-15 DIAGNOSIS — I10 Essential (primary) hypertension: Secondary | ICD-10-CM

## 2020-04-15 LAB — GLUCOSE, CAPILLARY: Glucose-Capillary: 118 mg/dL — ABNORMAL HIGH (ref 70–99)

## 2020-04-15 NOTE — Patient Instructions (Addendum)
Continue weighing daily and call for an overnight weight gain of > 2 pounds or a weekly weight gain of >5 pounds.   Call us in the future if you'd like to schedule another appointment 

## 2020-04-15 NOTE — Progress Notes (Signed)
Patient ID: Randall Vincent, male    DOB: [birth year], 79 y.o.   MRN: [id number]  HPI  Randall Vincent is a 79 y/o male with a history of DM, hyperlipidemia, HTN, atrial fibrillation, CAD, previous tobacco use and chronic heart failure.   Echo report from 01/11/20 reviewed and showed an EF of 50-55% along with mild LAE.  Admitted 02/02/20 due to shortness of breath due to HF exacerbation. Cardiology and ID consults obtained. Initially given IV lasix with transition to oral diuretics. Given 2 units of PRBC's due to anemia. Continued on oxygen. IV antibiotics given for septic arthritis of left leg. Discharged after 7 days. Admitted 01/27/20 due to acute on chronic HF along with septic arthritis of left knee. Initially given IV lasix with transition to oral diuretics. Given IV antibiotics. Discharged after 5 days.   He presents today for a follow-up visit with a chief complaint of minimal fatigue upon moderate exertion. He describes this as chronic in nature having been present for several years although he does feel like his strength is improving. He has associated pedal edema, easy bruising and left knee pain along with this. He denies any difficulty sleeping, dizziness, abdominal distention, palpitations, chest pain, shortness of breath, cough or weight gain.   Overall he says that he feels much better than the last time he was here.    Past Medical History:  Diagnosis Date  . A-fib (HCC)   . CHF (congestive heart failure) (HCC)   . Coronary artery disease   . Diabetes (HCC)   . Hyperlipidemia   . Hypertension    Past Surgical History:  Procedure Laterality Date  . BLADDER SURGERY    . BYPASS GRAFT    . CARDIOVERSION    . COLON SURGERY    . COLONOSCOPY N/A 01/15/2020   Procedure: COLONOSCOPY;  Surgeon: Pasty Spillers, MD;  Location: [facility] ENDOSCOPY;  Service: Endoscopy;  Laterality: N/A;  . COLONOSCOPY N/A 01/16/2020   Procedure: COLONOSCOPY;  Surgeon: Pasty Spillers, MD;  Location: [facility]  ENDOSCOPY;  Service: Endoscopy;  Laterality: N/A;  . ESOPHAGOGASTRODUODENOSCOPY N/A 01/13/2020   Procedure: ESOPHAGOGASTRODUODENOSCOPY (EGD);  Surgeon: Pasty Spillers, MD;  Location: [facility] ENDOSCOPY;  Service: Endoscopy;  Laterality: N/A;  . REPLACEMENT TOTAL KNEE BILATERAL    . STENT PLACEMENT VASCULAR ([facility] HX)    . TOTAL KNEE REVISION Left 01/16/2020   Procedure: TOTAL KNEE REVISION;  Surgeon: Lyndle Herrlich, MD;  Location: [facility] ORS;  Service: Orthopedics;  Laterality: Left;   No family history on file. Social History   Tobacco Use  . Smoking status: Former Smoker    Quit date: 1991    Years since quitting: 30.9  . Smokeless tobacco: Never Used  Substance Use Topics  . Alcohol use: Not on file   No Known Allergies  Prior to Admission medications   Medication Sig Start Date End Date Taking? Authorizing Provider  amiodarone (PACERONE) 200 MG tablet Take 200 mg by mouth daily. 10/29/19  Yes [provider]  atorvastatin (LIPITOR) 40 MG tablet Take 40 mg by mouth every evening.  06/18/19  Yes [provider]  carvedilol (COREG) 3.125 MG tablet Take 1 tablet (3.125 mg total) by mouth 2 (two) times daily with a meal. 02/09/20  Yes Wouk, Wilfred Curtis, MD  dabigatran (PRADAXA) 75 MG CAPS capsule Take 75 mg by mouth 2 (two) times daily.   Yes [provider]  diltiazem (CARDIZEM CD) 120 MG 24 hr capsule Take 1 capsule (120 mg  total) by mouth daily. 02/10/20  Yes Wouk, Wilfred Curtis, MD  ferrous sulfate 325 (65 FE) MG tablet Take 325 mg by mouth 2 (two) times daily with a meal.    Yes [provider]  metFORMIN (GLUCOPHAGE) 1000 MG tablet Take 1,000 mg by mouth 2 (two) times daily with a meal.   Yes [provider]  mupirocin ointment (BACTROBAN) 2 % Apply 1 application topically daily as needed (leg sores).  11/16/19  Yes [provider]  ramipril (ALTACE) 10 MG capsule Take 1 capsule (10 mg total) by mouth daily. 01/31/20  Yes Randall Finner,  MD  Semaglutide, 1 MG/DOSE, 2 MG/1.5ML SOPN Inject 1 mg into the skin every Thursday.   Yes [provider]  spironolactone (ALDACTONE) 25 MG tablet Take 25 mg by mouth daily. 06/18/19  Yes [provider]  torsemide (DEMADEX) 20 MG tablet Take 1 tablet (20 mg total) by mouth 2 (two) times daily. Patient taking differently: Take 10 mg by mouth daily.  02/09/20  Yes Wouk, Wilfred Curtis, MD  traMADol (ULTRAM) 50 MG tablet Take 1 tablet (50 mg total) by mouth every 8 (eight) hours as needed for moderate pain or severe pain. 01/31/20  Yes Randall Finner, MD  vitamin B-12 (CYANOCOBALAMIN) 1000 MCG tablet Take 1,000 mcg by mouth daily.   Yes [provider]    Review of Systems  Constitutional: Positive for fatigue. Negative for appetite change.  HENT: Negative for congestion, nosebleeds, postnasal drip and sore throat.   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  Respiratory: Negative for cough, shortness of breath and wheezing.   Cardiovascular: Positive for leg swelling. Negative for chest pain and palpitations.  Gastrointestinal: Negative for abdominal distention and abdominal pain.  Endocrine: Negative.   Genitourinary: Negative.   Musculoskeletal: Positive for arthralgias (left leg upon movement). Negative for back pain.  Skin: Negative.   Allergic/Immunologic: Negative.   Neurological: Negative for dizziness and light-headedness.  Hematological: Negative for adenopathy. Bruises/bleeds easily.  Psychiatric/Behavioral: Negative for dysphoric mood and sleep disturbance (sleeping in recliner ). The patient is not nervous/anxious.    Vitals:   04/15/20 1003  BP: (!) 154/56  Pulse: 65  Resp: 18  SpO2: 99%  Weight: 241 lb (109.3 kg)  Height: 5\' 8"  (1.727 m)   Wt Readings from Last 3 Encounters:  04/15/20 241 lb (109.3 kg)  02/18/20 263 lb (119.3 kg)  02/14/20 263 lb (119.3 kg)   Lab Results  Component Value Date   CREATININE 1.69 (H) 02/09/2020    CREATININE 1.47 (H) 02/08/2020   CREATININE 1.47 (H) 02/07/2020    Physical Exam Vitals and nursing note reviewed.  Constitutional:      Appearance: He is well-developed.  HENT:     Head: Normocephalic and atraumatic.  Cardiovascular:     Rate and Rhythm: Normal rate and regular rhythm.  Pulmonary:     Effort: Pulmonary effort is normal. No respiratory distress.     Breath sounds: No wheezing or rales.  Abdominal:     Palpations: Abdomen is soft.     Tenderness: There is no abdominal tenderness.  Musculoskeletal:     Cervical back: Normal range of motion and neck supple.     Right lower leg: No tenderness. Edema (1+ pitting) present.     Left lower leg: Edema (1+ pitting) present.  Skin:    General: Skin is warm and dry.     Findings: Ecchymosis (bilateral arms) present.  Neurological:  General: No focal deficit present.     Mental Status: He is alert.  Psychiatric:        Mood and Affect: Mood normal.        Behavior: Behavior normal.    Assessment & Plan:  1: Chronic heart failure with preserved ejection fraction with structural changes (LAE)- - NYHA class II - euvolemic today - weighing at home and says that his weight ranges from 230-234 pounds - weight today in clinic was 241 pounds - saw cardiology (Fath) 03/16/20 & returns December  - participating in outpatient PT - BNP 02/02/20 was 1387.0 - reports receiving both covid vaccines and booster vaccine - received his flu vaccine for this season  2: HTN- - BP mildly elevated today but patient walked to our office from [facility] - saw PCP Landry Mellow) 04/05/20 - BMP 04/05/20 reviewed and showed sodium 139, potassium 5.4, creatinine 2.28 and GFR 26 - patient says labs are getting repeated next week  3: DM- - A1c 04/05/20 was 6.1% - glucose in clinic today was 118  4: Atrial fibrillation- - saw cardiology (Cheek) 02/12/20    Patient did not bring his medications nor a list. Each medication  was verbally reviewed with the patient and he was encouraged to bring the bottles to every visit to confirm accuracy of list.  Due to HF stability, will not make a return appointment for patient at this time. Advised patient that he could call back at anytime to schedule another appointment and he was comfortable with this plan.

## 2020-06-24 ENCOUNTER — Ambulatory Visit: Payer: Medicare Other | Admitting: Gastroenterology

## 2020-07-29 DIAGNOSIS — L84 Corns and callosities: Secondary | ICD-10-CM | POA: Insufficient documentation

## 2020-07-29 DIAGNOSIS — M2041 Other hammer toe(s) (acquired), right foot: Secondary | ICD-10-CM | POA: Insufficient documentation

## 2020-07-29 DIAGNOSIS — B351 Tinea unguium: Secondary | ICD-10-CM | POA: Insufficient documentation

## 2020-09-01 ENCOUNTER — Emergency Department
Admission: EM | Admit: 2020-09-01 | Discharge: 2020-09-01 | Disposition: A | Discharge status: Home / Self Care | Payer: Medicare Other | Attending: Emergency Medicine | Admitting: Emergency Medicine

## 2020-09-01 ENCOUNTER — Other Ambulatory Visit: Payer: Self-pay

## 2020-09-01 DIAGNOSIS — Z794 Long term (current) use of insulin: Secondary | ICD-10-CM | POA: Diagnosis not present

## 2020-09-01 DIAGNOSIS — Z7901 Long term (current) use of anticoagulants: Secondary | ICD-10-CM | POA: Diagnosis not present

## 2020-09-01 DIAGNOSIS — I2510 Atherosclerotic heart disease of native coronary artery without angina pectoris: Secondary | ICD-10-CM | POA: Diagnosis not present

## 2020-09-01 DIAGNOSIS — I130 Hypertensive heart and chronic kidney disease with heart failure and stage 1 through stage 4 chronic kidney disease, or unspecified chronic kidney disease: Secondary | ICD-10-CM | POA: Diagnosis not present

## 2020-09-01 DIAGNOSIS — N1831 Chronic kidney disease, stage 3a: Secondary | ICD-10-CM | POA: Insufficient documentation

## 2020-09-01 DIAGNOSIS — Z87891 Personal history of nicotine dependence: Secondary | ICD-10-CM | POA: Diagnosis not present

## 2020-09-01 DIAGNOSIS — Z96653 Presence of artificial knee joint, bilateral: Secondary | ICD-10-CM | POA: Diagnosis not present

## 2020-09-01 DIAGNOSIS — S71001A Unspecified open wound, right hip, initial encounter: Secondary | ICD-10-CM | POA: Diagnosis not present

## 2020-09-01 DIAGNOSIS — X58XXXA Exposure to other specified factors, initial encounter: Secondary | ICD-10-CM | POA: Diagnosis not present

## 2020-09-01 DIAGNOSIS — I4891 Unspecified atrial fibrillation: Secondary | ICD-10-CM | POA: Insufficient documentation

## 2020-09-01 DIAGNOSIS — I5033 Acute on chronic diastolic (congestive) heart failure: Secondary | ICD-10-CM | POA: Diagnosis not present

## 2020-09-01 DIAGNOSIS — S79911A Unspecified injury of right hip, initial encounter: Secondary | ICD-10-CM | POA: Diagnosis present

## 2020-09-01 DIAGNOSIS — Z79899 Other long term (current) drug therapy: Secondary | ICD-10-CM | POA: Insufficient documentation

## 2020-09-01 DIAGNOSIS — E1122 Type 2 diabetes mellitus with diabetic chronic kidney disease: Secondary | ICD-10-CM | POA: Insufficient documentation

## 2020-09-01 LAB — BASIC METABOLIC PANEL
Anion gap: 11 (ref 5–15)
BUN: 46 mg/dL — ABNORMAL HIGH (ref 8–23)
CO2: 23 mmol/L (ref 22–32)
Calcium: 9.1 mg/dL (ref 8.9–10.3)
Chloride: 101 mmol/L (ref 98–111)
Creatinine, Ser: 2.27 mg/dL — ABNORMAL HIGH (ref 0.61–1.24)
GFR, Estimated: 29 mL/min — ABNORMAL LOW (ref >60)
Glucose, Bld: 108 mg/dL — ABNORMAL HIGH (ref 70–99)
Potassium: 4.5 mmol/L (ref 3.5–5.1)
Sodium: 135 mmol/L (ref 135–145)

## 2020-09-01 LAB — CBC
HCT: 31.5 % — ABNORMAL LOW (ref 39.0–52.0)
Hemoglobin: 9.7 g/dL — ABNORMAL LOW (ref 13.0–17.0)
MCH: 26.1 pg (ref 26.0–34.0)
MCHC: 30.8 g/dL (ref 30.0–36.0)
MCV: 84.7 fL (ref 80.0–100.0)
Platelets: 223 10*3/uL (ref 150–400)
RBC: 3.72 MIL/uL — ABNORMAL LOW (ref 4.22–5.81)
RDW: 16.6 % — ABNORMAL HIGH (ref 11.5–15.5)
WBC: 8.6 10*3/uL (ref 4.0–10.5)
nRBC: 0 % (ref 0.0–0.2)

## 2020-09-01 NOTE — ED Notes (Signed)
Dr. Bradler at bedside 

## 2020-09-01 NOTE — ED Triage Notes (Signed)
Pt via POV from home. Pt states that he has a wound on his sacrum that has been bleeding since 9:00am. Pt is on a blood thinner. Denies pain. Pt is A&Ox4 and NAD.

## 2020-09-01 NOTE — ED Provider Notes (Signed)
[facility] Emergency Department Provider Note   ____________________________________________   Event Date/Time   First MD Initiated Contact with Patient 09/01/20 1500     (approximate)  I have reviewed the triage vital signs and the nursing notes.   HISTORY  Chief Complaint Wound Check    HPI Morgan Rennert is a 80 y.o. male with a past medical history of A. fib, CHF, CAD, diabetes, and hypertension on blood thinners who presents for a bleeding right buttock wound that started bleeding earlier today and has not stopped since onset.  Patient has tried using heavy gauze and a Band-Aid but states because of his blood thinner that it will not stop bleeding.  Patient states that this is happened to him before and he has had to use clotting solution in the emergency department.  Patient currently denies any vision changes, tinnitus, difficulty speaking, facial droop, sore throat, chest pain, shortness of breath, abdominal pain, nausea/vomiting/diarrhea, dysuria, or weakness/numbness/paresthesias in any extremity         Past Medical History:  Diagnosis Date  . A-fib (HCC)   . CHF (congestive heart failure) (HCC)   . Coronary artery disease   . Diabetes (HCC)   . Hyperlipidemia   . Hypertension     Patient Active Problem List   Diagnosis Date Noted  . 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 CHF (congestive heart failure) (HCC) 02/10/2020  . CHF exacerbation (HCC) 02/03/2020  . Acute exacerbation of CHF (congestive heart failure) (HCC) 02/02/2020  . Pressure injury of skin 01/30/2020  . Acute on chronic diastolic CHF (congestive heart failure) (HCC) 01/27/2020  . Acute-on-chronic kidney injury (HCC) 01/27/2020  . A-fib (HCC)   . Obesity, Class III, BMI 40-49.9 (morbid obesity) (HCC)   . Diverticulosis of colon without hemorrhage   . Intestinal bypass or anastomosis status   . Melena 01/10/2020  . Symptomatic anemia 01/10/2020  . Acute blood loss anemia  01/10/2020  . Suspect Septic arthritis (HCC) 01/10/2020  . Chronic anticoagulation 01/10/2020  . 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 Chronic kidney disease, stage 3a (HCC) 01/10/2020  . Acute on chronic congestive heart failure (HCC) 01/10/2020  . Elevated troponin 01/10/2020  . Hyperlipidemia associated with type 2 diabetes mellitus (HCC) 07/04/2019  . Chest pain 06/26/2019  . CAD, multiple vessel 06/26/2019  . 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 Atrial fibrillation status post cardioversion 10/22/19 (HCC) 06/26/2019  . Essential hypertension 06/26/2019  . DM (diabetes mellitus), type 2 (HCC) 06/26/2019  . Anginal equivalent (HCC) 06/26/2019  . Drug-induced constipation 11/02/2018  . Low serum vitamin B12 11/02/2018  . Venous stasis dermatitis of both lower extremities 11/17/2017  . Ventral hernia without obstruction or gangrene 09/08/2016  . Peripheral edema 04/22/2016  . Hay fever 08/13/2015  . Impacted cerumen of both ears 08/13/2015  . Seasonal allergic rhinitis due to pollen 08/13/2015  . Bilateral edema of lower extremity 08/05/2015  . Localized edema 06/17/2015  . DOE (dyspnea on exertion) 05/19/2015  . Hyperlipidemia 05/19/2015  . 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 Obstructive sleep apnea 05/03/2015    Past Surgical History:  Procedure Laterality Date  . BLADDER SURGERY    . BYPASS GRAFT    . CARDIOVERSION    . COLON SURGERY    . COLONOSCOPY N/A 01/15/2020   Procedure: COLONOSCOPY;  Surgeon: Pasty Spillers, MD;  Location: [facility] ENDOSCOPY;  Service: Endoscopy;  Laterality: N/A;  . COLONOSCOPY N/A 01/16/2020   Procedure:  COLONOSCOPY;  Surgeon: Pasty Spillers, MD;  Location: [facility] ENDOSCOPY;  Service: Endoscopy;  Laterality: N/A;  . ESOPHAGOGASTRODUODENOSCOPY N/A 01/13/2020   Procedure: ESOPHAGOGASTRODUODENOSCOPY (EGD);  Surgeon: Pasty Spillers, MD;  Location: [facility] ENDOSCOPY;  Service: Endoscopy;  Laterality: N/A;  . REPLACEMENT TOTAL  KNEE BILATERAL    . STENT PLACEMENT VASCULAR ([facility] HX)    . TOTAL KNEE REVISION Left 01/16/2020   Procedure: TOTAL KNEE REVISION;  Surgeon: Lyndle Herrlich, MD;  Location: [facility] ORS;  Service: Orthopedics;  Laterality: Left;    Prior to Admission medications   Medication Sig Start Date End Date Taking? Authorizing Provider  amiodarone (PACERONE) 200 MG tablet Take 200 mg by mouth daily. 10/29/19   [provider]  atorvastatin (LIPITOR) 40 MG tablet Take 40 mg by mouth every evening.  06/18/19   [provider]  carvedilol (COREG) 3.125 MG tablet Take 1 tablet (3.125 mg total) by mouth 2 (two) times daily with a meal. 02/09/20   Wouk, Wilfred Curtis, MD  dabigatran (PRADAXA) 75 MG CAPS capsule Take 75 mg by mouth 2 (two) times daily.    [provider]  diltiazem (CARDIZEM CD) 120 MG 24 hr capsule Take 1 capsule (120 mg total) by mouth daily. 02/10/20   Wouk, Wilfred Curtis, MD  ferrous sulfate 325 (65 FE) MG tablet Take 325 mg by mouth 2 (two) times daily with a meal.     [provider]  metFORMIN (GLUCOPHAGE) 1000 MG tablet Take 1,000 mg by mouth 2 (two) times daily with a meal.    [provider]  mupirocin ointment (BACTROBAN) 2 % Apply 1 application topically daily as needed (leg sores).  11/16/19   [provider]  ramipril (ALTACE) 10 MG capsule Take 1 capsule (10 mg total) by mouth daily. 01/31/20   Enedina Finner, MD  Semaglutide, 1 MG/DOSE, 2 MG/1.5ML SOPN Inject 1 mg into the skin every Thursday.    [provider]  spironolactone (ALDACTONE) 25 MG tablet Take 25 mg by mouth daily. 06/18/19   [provider]  torsemide (DEMADEX) 20 MG tablet Take 1 tablet (20 mg total) by mouth 2 (two) times daily. Patient taking differently: Take 10 mg by mouth daily.  02/09/20   Wouk, Wilfred Curtis, MD  traMADol (ULTRAM) 50 MG tablet Take 1 tablet (50 mg total) by mouth every 8 (eight) hours as needed for moderate pain or severe pain. 01/31/20    Enedina Finner, MD  vitamin B-12 (CYANOCOBALAMIN) 1000 MCG tablet Take 1,000 mcg by mouth daily.    [provider]    Allergies Patient has no known allergies.  No family history on file.  Social History Social History   Tobacco Use  . Smoking status: Former Smoker    Quit date: 1991    Years since quitting: 31.2  . Smokeless tobacco: Never Used    Review of Systems Constitutional: No fever/chills Eyes: No visual changes. ENT: No sore throat. Cardiovascular: Denies chest pain. Respiratory: Denies shortness of breath. Gastrointestinal: No abdominal pain.  No nausea, no vomiting.  No diarrhea. Genitourinary: Negative for dysuria. Musculoskeletal: Negative for acute arthralgias Skin: Negative for rash.  Endorses a bleeding wound to the right buttock Neurological: Negative for headaches, weakness/numbness/paresthesias in any extremity Psychiatric: Negative for suicidal ideation/homicidal ideation   ____________________________________________   PHYSICAL EXAM:  VITAL SIGNS: ED Triage Vitals  Enc Vitals Group     BP 09/01/20 1402 (!) 167/66     Pulse Rate 09/01/20 1347 86  Resp 09/01/20 1347 20     Temp 09/01/20 1347 98.3 F (36.8 C)     Temp src --      SpO2 09/01/20 1347 97 %     Weight 09/01/20 1347 230 lb (104.3 kg)     Height 09/01/20 1347 5\' 8"  (1.727 m)     Head Circumference --      Peak Flow --      Pain Score 09/01/20 1347 0     Pain Loc --      Pain Edu? --      Excl. in GC? --    Constitutional: Alert and oriented. Well appearing and in no acute distress. Eyes: Conjunctivae are normal. PERRL. Head: Atraumatic. Nose: No congestion/rhinnorhea. Mouth/Throat: Mucous membranes are moist. Neck: No stridor Cardiovascular: Grossly normal heart sounds.  Good peripheral circulation. Respiratory: Normal respiratory effort.  No retractions. Gastrointestinal: Soft and nontender. No distention. Musculoskeletal: No obvious deformities Neurologic:   Normal speech and language. No gross focal neurologic deficits are appreciated. Skin:  Skin is warm and dry.  Erythema with punctate bleeding areas over the right buttock that is oozing Psychiatric: Mood and affect are normal. Speech and behavior are normal.  ____________________________________________   LABS (all labs ordered are listed, but only abnormal results are displayed)  Labs Reviewed  CBC - Abnormal; Notable for the following components:      Result Value   RBC 3.72 (*)    Hemoglobin 9.7 (*)    HCT 31.5 (*)    RDW 16.6 (*)    All other components within normal limits  BASIC METABOLIC PANEL - Abnormal; Notable for the following components:   Glucose, Bld 108 (*)    BUN 46 (*)    Creatinine, Ser 2.27 (*)    GFR, Estimated 29 (*)    All other components within normal limits   PROCEDURES  Procedure(s) performed (including Critical Care):  Procedures   ____________________________________________   INITIAL IMPRESSION / ASSESSMENT AND PLAN / ED COURSE  As part of my medical decision making, I reviewed the following data within the electronic MEDICAL RECORD NUMBER Nursing notes reviewed and incorporated, Old chart reviewed, and Notes from prior ED visits reviewed and incorporated        Patient presents with a bleeding wound on anticoagulation for A. fib that has not stopped bleeding since onset today.  Patient's wound was dressed with hemostatic gauze and monitored in the emergency department with no evidence of bleeding through this gauze.  Patient was provided with further wound care supplies prior to discharge.  All questions were answered.  The patient has been reexamined and is ready to be discharged.   Care plan has been outlined and the patient/family understands all current diagnoses, results, and treatment plans.  There are no new complaints, changes, or physical findings at this time.  All questions have been addressed and answered.  Patient was instructed to, and  agrees to follow-up with their primary care physician as well as return to the emergency department if any new or worsening symptoms develop.      ____________________________________________   FINAL CLINICAL IMPRESSION(S) / ED DIAGNOSES  Final diagnoses:  Bleeding from right hip wound, initial encounter     ED Discharge Orders    None       Note:  This document was prepared using Dragon voice recognition software and may include unintentional dictation errors.   11/01/20, MD 09/01/20 [phone]

## 2020-09-01 NOTE — ED Triage Notes (Signed)
Emergency Medicine Provider Triage Evaluation Note  Randall Vincent , a 80 y.o. male  was evaluated in room 2.  Pt complains of bleeding sacral wound.  Review of Systems  Positive: Soft tissue bleeding Negative: Fever, pain, trauma  Physical Exam  BP (!) 167/66   Pulse 86   Temp 98.3 F (36.8 C)   Resp 20   Ht 5\' 8"  (1.727 m)   Wt 104.3 kg   SpO2 97%   BMI 34.97 kg/m  Gen:   Awake, no distress   HEENT:  Atraumatic  Resp:  Normal effort  Cardiac:  Normal rate    Medical Decision Making  Medically screening exam initiated at 2:43 PM.  Appropriate orders placed.  Randall Vincent was informed that the remainder of the evaluation will be completed by another provider, this initial triage assessment does not replace that evaluation, and the importance of remaining in the ED until their evaluation is complete.  Clinical Impression  Sacral wound   [name], MD 09/01/20 1444

## 2020-09-01 NOTE — ED Notes (Signed)
Lt green, blue, and lav sent to lab

## 2021-02-24 DIAGNOSIS — M25511 Pain in right shoulder: Secondary | ICD-10-CM | POA: Insufficient documentation

## 2021-02-24 DIAGNOSIS — M19011 Primary osteoarthritis, right shoulder: Secondary | ICD-10-CM | POA: Insufficient documentation

## 2021-02-24 DIAGNOSIS — S52514D Nondisplaced fracture of right radial styloid process, subsequent encounter for closed fracture with routine healing: Secondary | ICD-10-CM | POA: Insufficient documentation

## 2021-05-05 ENCOUNTER — Encounter: Payer: Self-pay | Admitting: Orthopedic Surgery

## 2021-05-05 ENCOUNTER — Other Ambulatory Visit: Payer: Self-pay | Admitting: Orthopedic Surgery

## 2021-05-05 DIAGNOSIS — Z01818 Encounter for other preprocedural examination: Secondary | ICD-10-CM

## 2021-05-05 NOTE — H&P (Deleted)
  The note originally documented on this encounter has been moved the the encounter in which it belongs.  

## 2021-05-05 NOTE — H&P (Signed)
Randall Vincent MRN:  [id number] DOB/SEX:  [birth year]/male  CHIEF COMPLAINT:  Painful left Knee  HISTORY: Patient is a 80 y.o. male presented with a history of pain in the left knee. Onset of symptoms was gradual starting several months ago with gradually worsening course since that time. Prior procedures on the knee include arthroplasty. Patient has been treated conservatively with over-the-counter NSAIDs and activity modification. Patient currently rates pain in the knee at 10 out of 10 with activity. There is pain at night.  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   CHF (congestive heart failure) (HCC) 02/10/2020   CHF exacerbation (HCC) 02/03/2020   Acute exacerbation of CHF (congestive heart failure) (HCC) 02/02/2020   Pressure injury of skin 01/30/2020   Acute on chronic diastolic CHF (congestive heart failure) (HCC) 01/27/2020   Acute-on-chronic kidney injury (HCC) 01/27/2020   A-fib (HCC)    Obesity, Class III, BMI 40-49.9 (morbid obesity) (HCC)    Diverticulosis of colon without hemorrhage    Intestinal bypass or anastomosis status    Melena 01/10/2020   Symptomatic anemia 01/10/2020   Acute blood loss anemia 01/10/2020   Suspect Septic arthritis (HCC) 01/10/2020   Chronic anticoagulation 01/10/2020   History of bilateral knee replacement 01/10/2020   Chronic kidney disease, stage 3a (HCC) 01/10/2020   Acute on chronic congestive heart failure (HCC) 01/10/2020   Elevated troponin 01/10/2020   Hyperlipidemia associated with type 2 diabetes mellitus (HCC) 07/04/2019   Chest pain 06/26/2019   CAD, multiple vessel 06/26/2019   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/26/2019   Atrial fibrillation status post cardioversion 10/22/19 (HCC) 06/26/2019   Essential hypertension 06/26/2019   DM (diabetes mellitus), type 2 (HCC) 06/26/2019   Anginal equivalent (HCC) 06/26/2019   Drug-induced constipation 11/02/2018   Low serum vitamin B12 11/02/2018    Venous stasis dermatitis of both lower extremities 11/17/2017   Ventral hernia without obstruction or gangrene 09/08/2016   Peripheral edema 04/22/2016   Hay fever 08/13/2015   Impacted cerumen of both ears 08/13/2015   Seasonal allergic rhinitis due to pollen 08/13/2015   Bilateral edema of lower extremity 08/05/2015   Localized edema 06/17/2015   DOE (dyspnea on exertion) 05/19/2015   Hyperlipidemia 05/19/2015   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 05/03/2015   Obstructive sleep apnea 05/03/2015   Past Medical History:  Diagnosis Date   A-fib [facility])    CHF (congestive heart failure) (HCC)    Coronary artery disease    Diabetes (HCC)    Hyperlipidemia    Hypertension    Past Surgical History:  Procedure Laterality Date   BLADDER SURGERY     BYPASS GRAFT     CARDIOVERSION     COLON SURGERY     COLONOSCOPY N/A 01/15/2020   Procedure: COLONOSCOPY;  Surgeon: Pasty Spillers, MD;  Location: [facility] ENDOSCOPY;  Service: Endoscopy;  Laterality: N/A;   COLONOSCOPY N/A 01/16/2020   Procedure: COLONOSCOPY;  Surgeon: Pasty Spillers, MD;  Location: [facility] ENDOSCOPY;  Service: Endoscopy;  Laterality: N/A;   ESOPHAGOGASTRODUODENOSCOPY N/A 01/13/2020   Procedure: ESOPHAGOGASTRODUODENOSCOPY (EGD);  Surgeon: Pasty Spillers, MD;  Location: [facility] ENDOSCOPY;  Service: Endoscopy;  Laterality: N/A;   REPLACEMENT TOTAL KNEE BILATERAL     STENT PLACEMENT VASCULAR ([facility] HX)     TOTAL KNEE REVISION Left 01/16/2020   Procedure: TOTAL KNEE REVISION;  Surgeon: Lyndle Herrlich, MD;  Location: [facility] ORS;  Service: Orthopedics;  Laterality: Left;  MEDICATIONS:  (Not in a hospital admission)   ALLERGIES:  No Known Allergies  REVIEW OF SYSTEMS:  Pertinent items are noted in HPI.   FAMILY HISTORY:  No family history on file.  SOCIAL HISTORY:   Social History   Tobacco Use   Smoking status: Former    Types: Cigarettes    Quit date: 1991    Years since quitting: 31.9   Smokeless  tobacco: Never  Substance Use Topics   Alcohol use: Not on file     EXAMINATION:  Vital signs in last 24 hours: @VSRANGES @  General appearance: alert, cooperative, and no distress Neck: no JVD and supple, symmetrical, trachea midline Lungs: clear to auscultation bilaterally Heart: regular rate and rhythm, S1, S2 normal, no murmur, click, rub or gallop Abdomen: soft, non-tender; bowel sounds normal; no masses,  no organomegaly Extremities: extremities normal, atraumatic, no cyanosis or edema and Homans sign is negative, no sign of DVT Pulses: 2+ and symmetric Skin: Skin color, texture, turgor normal. No rashes or lesions Neurologic: Alert and oriented X 3, normal strength and tone. Normal symmetric reflexes. Normal coordination and gait  Musculoskeletal:  ROM 0-90, Ligaments intact,  Imaging Review Plain radiographs demonstrate components in alignment of the left knee. The overall alignment is neutral. The bone quality appears to be good for age and reported activity level.  Assessment/Plan: Left painful knee   The patient has failed conservative treatment.  The clearance notes were reviewed.  After discussion with the patient it was felt that Total Knee Revision was indicated. The procedure,  risks, and benefits of total knee arthroplasty were presented and reviewed. The risks including but not limited to aseptic loosening, infection, blood clots, vascular injury, stiffness, patella tracking problems complications among others were discussed. The patient acknowledged the explanation, agreed to proceed with the plan.  Carlynn Spry 05/05/2021, 2:54 PM

## 2021-05-20 ENCOUNTER — Other Ambulatory Visit: Payer: Self-pay

## 2021-05-20 ENCOUNTER — Encounter: Payer: Self-pay | Admitting: Orthopedic Surgery

## 2021-05-20 ENCOUNTER — Encounter
Admission: RE | Admit: 2021-05-20 | Discharge: 2021-05-20 | Disposition: A | Discharge status: Home / Self Care | Payer: Medicare Other | Source: Ambulatory Visit | Attending: Orthopedic Surgery | Admitting: Orthopedic Surgery

## 2021-05-20 VITALS — BP 133/79 | HR 65 | Temp 97.7°F | Resp 20 | Ht 68.0 in | Wt 218.7 lb

## 2021-05-20 DIAGNOSIS — Z01818 Encounter for other preprocedural examination: Secondary | ICD-10-CM

## 2021-05-20 DIAGNOSIS — Z01812 Encounter for preprocedural laboratory examination: Secondary | ICD-10-CM | POA: Diagnosis present

## 2021-05-20 DIAGNOSIS — Z01810 Encounter for preprocedural cardiovascular examination: Secondary | ICD-10-CM

## 2021-05-20 HISTORY — DX: Unspecified osteoarthritis, unspecified site: M19.90

## 2021-05-20 LAB — PROTIME-INR
INR: 1.5 — ABNORMAL HIGH (ref 0.8–1.2)
Prothrombin Time: 18.4 seconds — ABNORMAL HIGH (ref 11.4–15.2)

## 2021-05-20 LAB — CBC
HCT: 34.3 % — ABNORMAL LOW (ref 39.0–52.0)
Hemoglobin: 10.5 g/dL — ABNORMAL LOW (ref 13.0–17.0)
MCH: 26.3 pg (ref 26.0–34.0)
MCHC: 30.6 g/dL (ref 30.0–36.0)
MCV: 85.8 fL (ref 80.0–100.0)
RBC: 4 MIL/uL — ABNORMAL LOW (ref 4.22–5.81)
RDW: 17.9 % — ABNORMAL HIGH (ref 11.5–15.5)
WBC: 7.6 10*3/uL (ref 4.0–10.5)
nRBC: 0 % (ref 0.0–0.2)

## 2021-05-20 LAB — BASIC METABOLIC PANEL
Anion gap: 7 (ref 5–15)
BUN: 37 mg/dL — ABNORMAL HIGH (ref 8–23)
CO2: 26 mmol/L (ref 22–32)
Calcium: 8.5 mg/dL — ABNORMAL LOW (ref 8.9–10.3)
Chloride: 103 mmol/L (ref 98–111)
Creatinine, Ser: 1.92 mg/dL — ABNORMAL HIGH (ref 0.61–1.24)
GFR, Estimated: 35 mL/min — ABNORMAL LOW (ref >60)
Glucose, Bld: 130 mg/dL — ABNORMAL HIGH (ref 70–99)
Potassium: 4.1 mmol/L (ref 3.5–5.1)
Sodium: 136 mmol/L (ref 135–145)

## 2021-05-20 LAB — URINALYSIS, ROUTINE W REFLEX MICROSCOPIC
Bacteria, UA: NONE SEEN
Bilirubin Urine: NEGATIVE
Glucose, UA: NEGATIVE mg/dL
Ketones, ur: NEGATIVE mg/dL
Leukocytes,Ua: NEGATIVE
Nitrite: NEGATIVE
Protein, ur: 30 mg/dL — AB
Specific Gravity, Urine: 1.006 (ref 1.005–1.030)
pH: 5 (ref 5.0–8.0)

## 2021-05-20 LAB — APTT: aPTT: 77 seconds — ABNORMAL HIGH (ref 24–36)

## 2021-05-20 LAB — SURGICAL PCR SCREEN
MRSA, PCR: NEGATIVE
Staphylococcus aureus: NEGATIVE

## 2021-05-20 NOTE — Patient Instructions (Addendum)
Your procedure is scheduled on: Monday May 31, 2020. Report to Day Surgery inside [facility] 2nd floor. To find out your arrival time please call [phone] between 1PM - 3PM on Friday May 28, 2020.  Remember: Instructions that are not followed completely may result in serious medical risk,  up to and including death, or upon the discretion of your surgeon and anesthesiologist your  surgery may need to be rescheduled.     _X__ 1. Do not eat food or drink fluids after midnight the night before your procedure.                 No chewing gum or hard candies.  __X__2.  On the morning of surgery brush your teeth with toothpaste and water, you                may rinse your mouth with mouthwash if you wish.  Do not swallow any toothpaste of mouthwash.     _X__ 3.  No Alcohol for 24 hours before or after surgery.   _X__ 4.  Do Not Smoke or use e-cigarettes For 24 Hours Prior to Your Surgery.                 Do not use any chewable tobacco products for at least 6 hours prior to                 Surgery.  _X__  5.  Do not use any recreational drugs (marijuana, cocaine, heroin, ecstasy, MDMA or other)                For at least one week prior to your surgery.  Combination of these drugs with anesthesia                May have life threatening results.  __X__ 6.  Notify your doctor if there is any change in your medical condition      (cold, fever, infections).     Do not wear jewelry, make-up, hairpins, clips or nail polish. Do not wear lotions, powders, or perfumes. You may wear deodorant. Do not shave 48 hours prior to surgery. Men may shave face and neck. Do not bring valuables to the hospital.    [facility] is not responsible for any belongings or valuables.  Contacts, dentures or bridgework may not be worn into surgery. Leave your suitcase in the car. After surgery it may be brought to your room. For patients admitted to the hospital, discharge time  is determined by your treatment team.   Patients discharged the day of surgery will not be allowed to drive home.   Make arrangements for someone to be with you for the first 24 hours of your Same Day Discharge.   __X__ Take these medicines the morning of surgery with A SIP OF WATER:    1. amiodarone (PACERONE) 200 MG  2. carvedilol (COREG) 3.125 MG   3. diltiazem (TIAZAC) 240 MG  4.  5.  6.  ____ Fleet Enema (as directed)   __X__ Use CHG Soap (or wipes) as directed  ____ Use Benzoyl Peroxide Gel as instructed  ____ Use inhalers on the day of surgery  __X__ Stop metFORMIN (GLUCOPHAGE-XR) 500 MG 24 2 days prior to surgery (take last dose January 6)    ____ Take 1/2 of usual insulin dose the night before surgery. No insulin the morning          of surgery.   __X__ Call your  Doctor about dabigatran (PRADAXA) 75 MG  and ask when to stop before your surgery.   __X__ One Week prior to surgery- Stop Anti-inflammatories such as Ibuprofen, Aleve, Advil, Motrin, meloxicam (MOBIC), diclofenac, etodolac, ketorolac, Toradol, Daypro, piroxicam, Goody's or BC powders. OK TO USE TYLENOL IF NEEDED   __X__ Stop ALL supplements until after surgery.    ____ Bring C-Pap to the hospital.    If you have any questions regarding your pre-procedure instructions,  Please call Pre-admit Testing at [phone]

## 2021-05-25 ENCOUNTER — Encounter: Payer: Self-pay | Admitting: Orthopedic Surgery

## 2021-05-25 NOTE — Progress Notes (Signed)
Perioperative Services  Pre-Admission/Anesthesia Testing Clinical Review  Date: 05/25/21  Patient Demographics:  Name: Randall Vincent DOB:   [birth year] MRN:   [id number]  Planned Surgical Procedure(s):    Case: [id number] Date/Time: 05/31/21 0715   Procedure: TOTAL KNEE REVISION (Left: Knee)   Anesthesia type: Spinal   Pre-op diagnosis: Z96.652 Presence of left artificial knee joint   Location: [facility] OR ROOM 02 / [place] [facility]S FOR ANESTHESIA GROUP   Surgeons: Lovell Sheehan, MD   NOTE: Available PAT nursing documentation and vital signs have been reviewed. Clinical nursing staff has updated patient's PMH/PSHx, current medication list, and drug allergies/intolerances to ensure comprehensive history available to assist in medical decision making as it pertains to the aforementioned surgical procedure and anticipated anesthetic course. Extensive review of available clinical information performed. Randall Vincent PMH and PSHx updated with any diagnoses/procedures that  may have been inadvertently omitted during his intake with the pre-admission testing department's nursing staff.  Clinical Discussion:  Randall Vincent is a 81 y.o. male who is submitted for pre-surgical anesthesia review and clearance prior to him undergoing the above procedure. Patient is a Former Smoker (quit 05/1989). Pertinent PMH includes: CAD (s/p CABG), MI, atrial fibrillation, CHF, HTN, HLD, T2DM, CKD-III, OSAH, OA.  Patient is followed by cardiology Randall Glassing, MD). He was last seen in the cardiology clinic on 03/17/2021; notes reviewed.  At the time of his clinic visit, patient doing well overall from a cardiovascular perspective.  He denied any episodes of chest pain, shortness breath, PND, orthopnea, palpitations, significant peripheral edema, vertiginous symptoms, or presyncope/syncope.  PMH significant for cardiovascular diagnoses.  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  He underwent diagnostic left heart catheterization and  found to have sequential 50% and 90% lesions in the LAD that were not amenable to PCI. He ultimately underwent a two-vessel CABG procedure on 01/02/1990.  LIMA-LAD and SVG-RCA bypass grafts were placed.  Patient underwent diagnostic left heart catheterization on 06/07/2006 revealing multivessel CAD; 99% distal RCA and 90% mid LCx.  LIMA-LAD and SVG-RCA bypass grafts were patent.  Patient underwent PCI whereby vision stents were placed to both the RCA and LCx lesions resulting in 0% residual stenosis.  Patient underwent DCCV procedures x 3 for refractory atrial fibrillation; 07/10/2015, 08/08/2019, and 10/22/2019.  Last TTE was performed on 01/11/2020 revealing a low normal left ventricular systolic function with an EF of 50-55%.  Left atrium was mildly dilated.  Aortic valve sclerotic with no evidence of stenosis.  Pulmonary artery mildly dilated.  Patient with a history of persistent atrial fibrillation; CHA2DS2-VASc Score = 6 (age x 2, CHF, HTN, previous MI, T2DM).  Rate and rhythm maintained on daily amiodarone + diltiazem + carvedilol.  Patient chronically anticoagulated on daily dabigatran; compliant with therapy with no reports or evidence of GI bleeding.  Blood pressure well controlled at 130/72 on currently prescribed beta-blocker, CCB, ACEi, and diuretic therapies.  Patient is on a statin for his HLD. Patient has an OSAH diagnosis and is compliant with prescribed nocturnal PAP therapy.  Functional capacity, as defined by DASI, is documented as being >/= 4 METS.  No changes were made to his medication regimen.  Patient to follow-up with outpatient cardiology in 4 months or sooner if needed.  Randall Vincent is scheduled for an elective LEFT TOTAL KNEE REVISION on 05/31/2021 with Dr. [name], MD.  Given patient's past medical history significant for cardiovascular diagnoses, presurgical cardiac clearance was sought by the PAT team. Per cardiology, "this patient is optimized  for surgery and may  proceed with the planned procedural course with a LOW risk of significant perioperative cardiovascular complications".  Again, this patient is on daily anticoagulation therapy.  He has been instructed on recommendations from his cardiologist for holding his dabigatran for 5 days prior to his procedure with plans to restart as soon as postoperative bleeding risk felt to be minimized by his primary attending surgeon.  Patient is aware that his last dose of dabigatran will be on 05/25/2021.  Patient denies previous perioperative complications with anesthesia in the past. In review of the available records, it is noted that patient underwent a combined general + neuraxial anesthetic course here (ASA III) in 12/2019 without documented complications.   Vitals with BMI 05/20/2021 09/01/2020 04/15/2020  Height 5\' 8"  5\' 8"  5\' 8"   Weight 218 lbs 11 oz 230 lbs 241 lbs  BMI 33.26 [id number] Q000111Q  Systolic Q000111Q [id number]  Diastolic 79 66 56  Pulse 65 86 65    Providers/Specialists:   NOTE: Primary physician provider listed below. Patient may have been seen by APP or partner within same practice.   PROVIDER ROLE / SPECIALTY LAST Jayme Cloud, MD Orthopedic Surgery 05/05/2021  Dionne Bucy., MD Primary Care Provider 05/02/2021  Bartholome Bill, MD Cardiology 03/17/2021   Allergies:  Patient has no known allergies.  Current Home Medications:   No current facility-administered medications for this encounter.    amiodarone (PACERONE) 200 MG tablet   atorvastatin (LIPITOR) 40 MG tablet   carvedilol (COREG) 3.125 MG tablet   dabigatran (PRADAXA) 75 MG CAPS capsule   diltiazem (TIAZAC) 240 MG 24 hr capsule   ferrous sulfate 325 (65 FE) MG tablet   metFORMIN (GLUCOPHAGE-XR) 500 MG 24 hr tablet   mupirocin ointment (BACTROBAN) 2 %   ramipril (ALTACE) 10 MG capsule   Semaglutide, 1 MG/DOSE, 2 MG/1.5ML SOPN   torsemide (DEMADEX) 20 MG tablet   traMADol (ULTRAM) 50 MG tablet   vitamin B-12  (CYANOCOBALAMIN) 1000 MCG tablet   diltiazem (CARDIZEM CD) 120 MG 24 hr capsule   History:   Past Medical History:  Diagnosis Date   A-fib (HCC)    a.) CHA2DS2-VASc = 6 (age x 2, CHF, HTN, previous MI, T2DM). b.) Rate/rhythm maintained on amiodarone + diltiazem + carvedilol; chronically anticoagulated using dabigatran. c.) DCCV 07/10/2015, 08/08/2019, 10/22/2019.   Arthritis    CHF (congestive heart failure) (HCC)    CKD (chronic kidney disease), stage III ([place])    Coronary artery disease 1991   a.) 2v CABG in 1991. b.) PCI 06/07/2006 --> Vision stents to native LCx and RCA   Hyperlipidemia    Hypertension    Long term current use of anticoagulant    a.) dabigatran   Myocardial infarction ([place]) 1991   a.) LHC --> 50% and 90% LAD lesions; referred to CVTS. 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   a.) LIMA-LAD, SVG-RCA   T2DM (type 2 diabetes mellitus) ([place])    Past Surgical History:  Procedure Laterality Date   BACK SURGERY  1970   L5-L6 ruptured disk   BLADDER SURGERY  2000   CARDIOVERSION N/A 07/10/2015   CARDIOVERSION N/A 08/08/2019   CARDIOVERSION N/A 10/22/2019   COLON SURGERY     COLONOSCOPY N/A 01/15/2020   Procedure: COLONOSCOPY;  Surgeon: Virgel Manifold, MD;  Location: [facility] ENDOSCOPY;  Service: Endoscopy;  Laterality: N/A;   COLONOSCOPY N/A  01/16/2020   Procedure: COLONOSCOPY;  Surgeon: Virgel Manifold, MD;  Location: [facility] ENDOSCOPY;  Service: Endoscopy;  Laterality: N/A;   CORONARY ARTERY BYPASS GRAFT N/A 01/02/1990   Procedure: 2v CORONARY ARTERY BYPASS GRAFT (LIMA-LAD, SVG-RCA)   ESOPHAGOGASTRODUODENOSCOPY N/A 01/13/2020   Procedure: ESOPHAGOGASTRODUODENOSCOPY (EGD);  Surgeon: Virgel Manifold, MD;  Location: [facility] ENDOSCOPY;  Service: Endoscopy;  Laterality: N/A;   REPLACEMENT TOTAL KNEE BILATERAL Bilateral 2008   STENT PLACEMENT VASCULAR ([place] HX)  2008   TOTAL KNEE REVISION Left 01/16/2020    Procedure: TOTAL KNEE REVISION;  Surgeon: Lovell Sheehan, MD;  Location: [facility] ORS;  Service: Orthopedics;  Laterality: Left;   No family history on file. Social History   Tobacco Use   Smoking status: Former    Types: Cigarettes    Quit date: 1991    Years since quitting: 32.0   Smokeless tobacco: Never  Vaping Use   Vaping Use: Never used  Substance Use Topics   Alcohol use: Yes    Alcohol/week: 1.0 standard drink    Types: 1 Cans of beer per week    Comment: very rarely    Pertinent Clinical Results:  LABS: Labs reviewed: Acceptable for surgery.  Hospital Outpatient Visit on 05/20/2021  Component Date Value Ref Range Status   WBC 05/20/2021 7.6  4.0 - 10.5 K/uL Final   WHITE COUNT CONFIRMED ON SMEAR   RBC 05/20/2021 4.00 (L)  4.22 - 5.81 MIL/uL Final   Hemoglobin 05/20/2021 10.5 (L)  13.0 - 17.0 g/dL Final   HCT 05/20/2021 34.3 (L)  39.0 - 52.0 % Final   MCV 05/20/2021 85.8  80.0 - 100.0 fL Final   MCH 05/20/2021 26.3  26.0 - 34.0 pg Final   MCHC 05/20/2021 30.6  30.0 - 36.0 g/dL Final   RDW 05/20/2021 17.9 (H)  11.5 - 15.5 % Final   Platelets 05/20/2021 PLATELET CLUMPS NOTED ON SMEAR  150 - 400 K/uL Final   nRBC 05/20/2021 0.0  0.0 - 0.2 % Final   Performed at [facility], [address] [place], [place] [address]   Sodium 05/20/2021 136  135 - 145 mmol/L Final   Potassium 05/20/2021 4.1  3.5 - 5.1 mmol/L Final   Chloride 05/20/2021 103  98 - 111 mmol/L Final   CO2 05/20/2021 26  22 - 32 mmol/L Final   Glucose, Bld 05/20/2021 130 (H)  70 - 99 mg/dL Final   Glucose reference range applies only to samples taken after fasting for at least 8 hours.   BUN 05/20/2021 37 (H)  8 - 23 mg/dL Final   Creatinine, Ser 05/20/2021 1.92 (H)  0.61 - 1.24 mg/dL Final   Calcium 05/20/2021 8.5 (L)  8.9 - 10.3 mg/dL Final   GFR, Estimated 05/20/2021 35 (L)  >60 mL/min Final   Comment: (NOTE) Calculated using the CKD-EPI Creatinine Equation (2021)    Anion gap  05/20/2021 7  5 - 15 Final   Performed at [facility], [address] [place], [place] [address]   Prothrombin Time 05/20/2021 18.4 (H)  11.4 - 15.2 seconds Final   INR 05/20/2021 1.5 (H)  0.8 - 1.2 Final   Comment: (NOTE) INR goal varies based on device and disease states. Performed at [facility], [address] [place], [place] [address]    aPTT 05/20/2021 77 (H)  24 - 36 seconds Final   Comment:        IF BASELINE aPTT IS ELEVATED, SUGGEST PATIENT RISK ASSESSMENT  BE USED TO DETERMINE APPROPRIATE ANTICOAGULANT THERAPY. Performed at [facility], [address], [place] [address]    Color, Urine 05/20/2021 STRAW (A)  YELLOW Final   APPearance 05/20/2021 CLEAR (A)  CLEAR Final   Specific Gravity, Urine 05/20/2021 1.006  1.005 - 1.030 Final   pH 05/20/2021 5.0  5.0 - 8.0 Final   Glucose, UA 05/20/2021 NEGATIVE  NEGATIVE mg/dL Final   Hgb urine dipstick 05/20/2021 SMALL (A)  NEGATIVE Final   Bilirubin Urine 05/20/2021 NEGATIVE  NEGATIVE Final   Ketones, ur 05/20/2021 NEGATIVE  NEGATIVE mg/dL Final   Protein, ur 05/20/2021 30 (A)  NEGATIVE mg/dL Final   Nitrite 05/20/2021 NEGATIVE  NEGATIVE Final   Leukocytes,Ua 05/20/2021 NEGATIVE  NEGATIVE Final   RBC / HPF 05/20/2021 0-5  0 - 5 RBC/hpf Final   WBC, UA 05/20/2021 0-5  0 - 5 WBC/hpf Final   Bacteria, UA 05/20/2021 NONE SEEN  NONE SEEN Final   Squamous Epithelial / LPF 05/20/2021 0-5  0 - 5 Final   Mucus 05/20/2021 PRESENT   Final   Hyaline Casts, UA 05/20/2021 PRESENT   Final   Performed at [facility], [address] [place],  [address]   MRSA, PCR 05/20/2021 NEGATIVE  NEGATIVE Final   Staphylococcus aureus 05/20/2021 NEGATIVE  NEGATIVE Final   Comment: (NOTE) The Xpert SA Assay (FDA approved for NASAL specimens in patients 35 years of age and older), is one component of a comprehensive surveillance program. It is not intended to diagnose infection nor to guide or  monitor treatment. Performed at [facility], [address] [place],  [address]     ECG: Date: 03/17/2021 Rate: 65 bpm Rhythm:  Sinus rhythm with PACs Axis (leads I and aVF): Normal Intervals: PR 166 ms. QRS 138 ms. QTc 522 ms. ST segment and T wave changes: No evidence of acute ST segment elevation or depression.  Evidence of an age undetermined inferior infarct present Comparison: Similar to previous tracing obtained on 02/02/2020   IMAGING / PROCEDURES: TRANSTHORACIC ECHOCARDIOGRAM performed on 01/11/2020 Left ventricular ejection fraction, by estimation, is 50 to 55%. The left ventricle has low normal function. Left ventricular endocardial  border not optimally defined to evaluate regional wall motion. Left  ventricular diastolic function could not be evaluated. Right ventricular systolic function is normal. The right ventricular size is normal. Tricuspid regurgitation signal is inadequate for assessing PA pressure.  Left atrial size was mildly dilated. The mitral valve is normal in structure. No evidence of mitral valve regurgitation. No evidence of mitral stenosis.  The aortic valve is normal in structure. Aortic valve regurgitation is not visualized. Mild aortic valve sclerosis is present, with no evidence  of aortic valve stenosis.  Mildly dilated pulmonary artery.  The inferior vena cava is normal in size with greater than 50% respiratory variability, suggesting right atrial pressure of 3 mmHg.   Impression and Plan:  Randall Vincent has been referred for pre-anesthesia review and clearance prior to him undergoing the planned anesthetic and procedural courses. Available labs, pertinent testing, and imaging results were personally reviewed by me. This patient has been appropriately cleared by cardiology with an overall LOW risk of significant perioperative cardiovascular complications.  Based on clinical review performed today (05/25/21), barring any significant  acute changes in the patient's overall condition, it is anticipated that he will be able to proceed with the planned surgical intervention. Any acute changes in clinical condition may necessitate his procedure being postponed and/or cancelled.  Patient will meet with anesthesia team (MD and/or CRNA) on the day of his procedure for preoperative evaluation/assessment. Questions regarding anesthetic course will be fielded at that time.   Pre-surgical instructions were reviewed with the patient during his PAT appointment and questions were fielded by PAT clinical staff. Patient was advised that if any questions or concerns arise prior to his procedure then he should return a call to PAT and/or his surgeon's office to discuss.  Honor Loh, MSN, APRN, FNP-C, CEN [facility]  Peri-operative Services Nurse Practitioner Phone: [phone] Fax: [phone] 05/25/21 9:53 AM  NOTE: This note has been prepared using Dragon dictation software. Despite my best ability to proofread, there is always the potential that unintentional transcriptional errors may still occur from this process.

## 2021-05-27 ENCOUNTER — Other Ambulatory Visit
Admission: RE | Admit: 2021-05-27 | Discharge: 2021-05-27 | Disposition: A | Discharge status: Home / Self Care | Payer: Medicare Other | Source: Ambulatory Visit | Attending: Orthopedic Surgery | Admitting: Orthopedic Surgery

## 2021-05-27 ENCOUNTER — Other Ambulatory Visit: Payer: Self-pay

## 2021-05-27 DIAGNOSIS — Z20822 Contact with and (suspected) exposure to covid-19: Secondary | ICD-10-CM | POA: Insufficient documentation

## 2021-05-27 LAB — SARS CORONAVIRUS 2 (TAT 6-24 HRS): SARS Coronavirus 2: NEGATIVE

## 2021-05-31 ENCOUNTER — Encounter
Admission: RE | Disposition: A | Discharge status: Home Health | Payer: Self-pay | Source: Ambulatory Visit | Attending: Orthopedic Surgery

## 2021-05-31 ENCOUNTER — Inpatient Hospital Stay: Payer: Medicare Other

## 2021-05-31 ENCOUNTER — Encounter: Payer: Self-pay | Admitting: Orthopedic Surgery

## 2021-05-31 ENCOUNTER — Inpatient Hospital Stay: Payer: Medicare Other | Admitting: Urgent Care

## 2021-05-31 ENCOUNTER — Other Ambulatory Visit: Payer: Self-pay

## 2021-05-31 ENCOUNTER — Inpatient Hospital Stay
Admission: RE | Admit: 2021-05-31 | Discharge: 2021-06-03 | DRG: 467 | Disposition: A | Discharge status: Home Health | Payer: Medicare Other | Source: Ambulatory Visit | Attending: Orthopedic Surgery | Admitting: Orthopedic Surgery

## 2021-05-31 DIAGNOSIS — E669 Obesity, unspecified: Secondary | ICD-10-CM | POA: Diagnosis present

## 2021-05-31 DIAGNOSIS — I4819 Other persistent atrial fibrillation: Secondary | ICD-10-CM | POA: Diagnosis present

## 2021-05-31 DIAGNOSIS — I5032 Chronic diastolic (congestive) heart failure: Secondary | ICD-10-CM | POA: Diagnosis present

## 2021-05-31 DIAGNOSIS — I2510 Atherosclerotic heart disease of native coronary artery without angina pectoris: Secondary | ICD-10-CM | POA: Diagnosis present

## 2021-05-31 DIAGNOSIS — E1122 Type 2 diabetes mellitus with diabetic chronic kidney disease: Secondary | ICD-10-CM | POA: Diagnosis present

## 2021-05-31 DIAGNOSIS — Z87891 Personal history of nicotine dependence: Secondary | ICD-10-CM

## 2021-05-31 DIAGNOSIS — Z951 Presence of aortocoronary bypass graft: Secondary | ICD-10-CM

## 2021-05-31 DIAGNOSIS — N1831 Chronic kidney disease, stage 3a: Secondary | ICD-10-CM | POA: Diagnosis present

## 2021-05-31 DIAGNOSIS — Z96651 Presence of right artificial knee joint: Secondary | ICD-10-CM | POA: Diagnosis present

## 2021-05-31 DIAGNOSIS — Y831 Surgical operation with implant of artificial internal device as the cause of abnormal reaction of the patient, or of later complication, without mention of misadventure at the time of the procedure: Secondary | ICD-10-CM | POA: Diagnosis present

## 2021-05-31 DIAGNOSIS — M1990 Unspecified osteoarthritis, unspecified site: Secondary | ICD-10-CM | POA: Diagnosis present

## 2021-05-31 DIAGNOSIS — Z7984 Long term (current) use of oral hypoglycemic drugs: Secondary | ICD-10-CM | POA: Diagnosis not present

## 2021-05-31 DIAGNOSIS — G4733 Obstructive sleep apnea (adult) (pediatric): Secondary | ICD-10-CM | POA: Diagnosis present

## 2021-05-31 DIAGNOSIS — Z7985 Long-term (current) use of injectable non-insulin antidiabetic drugs: Secondary | ICD-10-CM

## 2021-05-31 DIAGNOSIS — I130 Hypertensive heart and chronic kidney disease with heart failure and stage 1 through stage 4 chronic kidney disease, or unspecified chronic kidney disease: Secondary | ICD-10-CM | POA: Diagnosis present

## 2021-05-31 DIAGNOSIS — Z6833 Body mass index (BMI) 33.0-33.9, adult: Secondary | ICD-10-CM

## 2021-05-31 DIAGNOSIS — E785 Hyperlipidemia, unspecified: Secondary | ICD-10-CM | POA: Diagnosis present

## 2021-05-31 DIAGNOSIS — H5462 Unqualified visual loss, left eye, normal vision right eye: Secondary | ICD-10-CM | POA: Diagnosis present

## 2021-05-31 DIAGNOSIS — T8454XA Infection and inflammatory reaction due to internal left knee prosthesis, initial encounter: Secondary | ICD-10-CM | POA: Diagnosis present

## 2021-05-31 DIAGNOSIS — I252 Old myocardial infarction: Secondary | ICD-10-CM | POA: Diagnosis not present

## 2021-05-31 DIAGNOSIS — G8918 Other acute postprocedural pain: Secondary | ICD-10-CM | POA: Diagnosis not present

## 2021-05-31 DIAGNOSIS — E1165 Type 2 diabetes mellitus with hyperglycemia: Secondary | ICD-10-CM | POA: Diagnosis not present

## 2021-05-31 DIAGNOSIS — Z79899 Other long term (current) drug therapy: Secondary | ICD-10-CM

## 2021-05-31 DIAGNOSIS — Z7901 Long term (current) use of anticoagulants: Secondary | ICD-10-CM

## 2021-05-31 DIAGNOSIS — Z96652 Presence of left artificial knee joint: Secondary | ICD-10-CM

## 2021-05-31 HISTORY — DX: Long term (current) use of anticoagulants: Z79.01

## 2021-05-31 HISTORY — DX: Obstructive sleep apnea (adult) (pediatric): G47.33

## 2021-05-31 HISTORY — DX: Type 2 diabetes mellitus without complications: E11.9

## 2021-05-31 HISTORY — PX: TOTAL KNEE REVISION: SHX996

## 2021-05-31 HISTORY — DX: Chronic kidney disease, stage 3 unspecified: N18.30

## 2021-05-31 LAB — TYPE AND SCREEN
ABO/RH(D): B POS
Antibody Screen: NEGATIVE

## 2021-05-31 LAB — GLUCOSE, CAPILLARY
Glucose-Capillary: 141 mg/dL — ABNORMAL HIGH (ref 70–99)
Glucose-Capillary: 129 mg/dL — ABNORMAL HIGH (ref 70–99)

## 2021-05-31 SURGERY — TOTAL KNEE REVISION
Anesthesia: General | Site: Knee | Laterality: Left

## 2021-05-31 MED ORDER — METOCLOPRAMIDE HCL 5 MG PO TABS
5.0000 mg | ORAL_TABLET | Freq: Three times a day (TID) | ORAL | Status: DC | PRN
  Filled 2021-05-31: qty 2

## 2021-05-31 MED ORDER — CARVEDILOL 3.125 MG PO TABS
3.1250 mg | ORAL_TABLET | Freq: Two times a day (BID) | ORAL | Status: DC
  Administered 2021-05-31 – 2021-06-03 (×6): 3.125 mg via ORAL
  Filled 2021-05-31 (×6): qty 1

## 2021-05-31 MED ORDER — AMIODARONE HCL 200 MG PO TABS
200.0000 mg | ORAL_TABLET | Freq: Every day | ORAL | Status: DC
  Administered 2021-06-01 – 2021-06-03 (×3): 200 mg via ORAL
  Filled 2021-05-31 (×3): qty 1

## 2021-05-31 MED ORDER — CEFAZOLIN SODIUM-DEXTROSE 2-4 GM/100ML-% IV SOLN
2.0000 g | Freq: Four times a day (QID) | INTRAVENOUS | Status: AC
  Administered 2021-05-31 (×2): 2 g via INTRAVENOUS
  Filled 2021-05-31 (×4): qty 100

## 2021-05-31 MED ORDER — MUPIROCIN 2 % EX OINT
1.0000 "application " | TOPICAL_OINTMENT | Freq: Every day | CUTANEOUS | Status: DC | PRN
  Filled 2021-05-31: qty 22

## 2021-05-31 MED ORDER — SODIUM CHLORIDE FLUSH 0.9 % IV SOLN
INTRAVENOUS | Status: AC
  Filled 2021-05-31: qty 40

## 2021-05-31 MED ORDER — ASPIRIN 81 MG PO CHEW
81.0000 mg | CHEWABLE_TABLET | Freq: Two times a day (BID) | ORAL | Status: DC
  Administered 2021-05-31 – 2021-06-02 (×5): 81 mg via ORAL
  Filled 2021-05-31 (×6): qty 1

## 2021-05-31 MED ORDER — KETOROLAC TROMETHAMINE 15 MG/ML IJ SOLN
INTRAMUSCULAR | Status: AC
  Filled 2021-05-31: qty 1

## 2021-05-31 MED ORDER — METFORMIN HCL ER 500 MG PO TB24
500.0000 mg | ORAL_TABLET | Freq: Two times a day (BID) | ORAL | Status: DC
  Administered 2021-05-31 – 2021-06-03 (×7): 500 mg via ORAL
  Filled 2021-05-31 (×8): qty 1

## 2021-05-31 MED ORDER — ACETAMINOPHEN 325 MG PO TABS
325.0000 mg | ORAL_TABLET | Freq: Four times a day (QID) | ORAL | Status: DC | PRN
  Filled 2021-05-31 (×2): qty 2

## 2021-05-31 MED ORDER — CHLORHEXIDINE GLUCONATE 0.12 % MT SOLN
OROMUCOSAL | Status: AC
  Administered 2021-05-31: 15 mL via OROMUCOSAL
  Filled 2021-05-31: qty 15

## 2021-05-31 MED ORDER — ACETAMINOPHEN 10 MG/ML IV SOLN
INTRAVENOUS | Status: AC
  Filled 2021-05-31: qty 100

## 2021-05-31 MED ORDER — TRANEXAMIC ACID-NACL 1000-0.7 MG/100ML-% IV SOLN: 1000.0000 mg | INTRAVENOUS | Status: DC

## 2021-05-31 MED ORDER — PROPOFOL 10 MG/ML IV BOLUS
INTRAVENOUS | Status: AC
  Filled 2021-05-31: qty 60

## 2021-05-31 MED ORDER — PROPOFOL 1000 MG/100ML IV EMUL
INTRAVENOUS | Status: AC
  Filled 2021-05-31: qty 100

## 2021-05-31 MED ORDER — TRANEXAMIC ACID 1000 MG/10ML IV SOLN
INTRAVENOUS | Status: DC | PRN
  Administered 2021-05-31: 1 mg via TOPICAL

## 2021-05-31 MED ORDER — ACETAMINOPHEN 10 MG/ML IV SOLN
INTRAVENOUS | Status: DC | PRN
  Administered 2021-05-31: 1000 mg via INTRAVENOUS

## 2021-05-31 MED ORDER — VITAMIN B-12 1000 MCG PO TABS
1000.0000 ug | ORAL_TABLET | Freq: Every day | ORAL | Status: DC
  Administered 2021-05-31 – 2021-06-03 (×4): 1000 ug via ORAL
  Filled 2021-05-31 (×4): qty 1

## 2021-05-31 MED ORDER — MENTHOL 3 MG MT LOZG
1.0000 | LOZENGE | OROMUCOSAL | Status: DC | PRN
  Filled 2021-05-31: qty 9

## 2021-05-31 MED ORDER — GLYCOPYRROLATE 0.2 MG/ML IJ SOLN
INTRAMUSCULAR | Status: DC | PRN
  Administered 2021-05-31: .2 mg via INTRAVENOUS

## 2021-05-31 MED ORDER — ROCURONIUM BROMIDE 100 MG/10ML IV SOLN
INTRAVENOUS | Status: DC | PRN
  Administered 2021-05-31: 50 mg via INTRAVENOUS
  Administered 2021-05-31 (×2): 10 mg via INTRAVENOUS

## 2021-05-31 MED ORDER — PROPOFOL 10 MG/ML IV BOLUS
INTRAVENOUS | Status: AC
  Filled 2021-05-31: qty 40

## 2021-05-31 MED ORDER — SODIUM CHLORIDE 0.9 % IR SOLN
Status: DC | PRN
  Administered 2021-05-31: 1004 mL

## 2021-05-31 MED ORDER — ONDANSETRON HCL 4 MG/2ML IJ SOLN
INTRAMUSCULAR | Status: DC | PRN
  Administered 2021-05-31: 4 mg via INTRAVENOUS

## 2021-05-31 MED ORDER — SUGAMMADEX SODIUM 200 MG/2ML IV SOLN
INTRAVENOUS | Status: DC | PRN
  Administered 2021-05-31: 200 mg via INTRAVENOUS

## 2021-05-31 MED ORDER — FERROUS SULFATE 325 (65 FE) MG PO TABS
325.0000 mg | ORAL_TABLET | Freq: Every day | ORAL | Status: DC
  Administered 2021-05-31 – 2021-06-03 (×4): 325 mg via ORAL
  Filled 2021-05-31 (×4): qty 1

## 2021-05-31 MED ORDER — FENTANYL CITRATE (PF) 100 MCG/2ML IJ SOLN
INTRAMUSCULAR | Status: DC | PRN
  Administered 2021-05-31: 50 ug via INTRAVENOUS
  Administered 2021-05-31: 25 ug via INTRAVENOUS
  Administered 2021-05-31: 50 ug via INTRAVENOUS

## 2021-05-31 MED ORDER — FENTANYL CITRATE (PF) 100 MCG/2ML IJ SOLN
INTRAMUSCULAR | Status: AC
  Filled 2021-05-31: qty 2

## 2021-05-31 MED ORDER — ONDANSETRON HCL 4 MG/2ML IJ SOLN: 4.0000 mg | Freq: Once | INTRAMUSCULAR | Status: DC | PRN

## 2021-05-31 MED ORDER — TRANEXAMIC ACID 1000 MG/10ML IV SOLN
INTRAVENOUS | Status: AC
  Filled 2021-05-31: qty 10

## 2021-05-31 MED ORDER — EPHEDRINE SULFATE 50 MG/ML IJ SOLN
INTRAMUSCULAR | Status: DC | PRN
  Administered 2021-05-31: 5 mg via INTRAVENOUS
  Administered 2021-05-31 (×2): 10 mg via INTRAVENOUS

## 2021-05-31 MED ORDER — PHENOL 1.4 % MT LIQD
1.0000 | OROMUCOSAL | Status: DC | PRN
  Filled 2021-05-31: qty 177

## 2021-05-31 MED ORDER — BUPIVACAINE LIPOSOME 1.3 % IJ SUSP
INTRAMUSCULAR | Status: AC
  Filled 2021-05-31: qty 20

## 2021-05-31 MED ORDER — TORSEMIDE 20 MG PO TABS
10.0000 mg | ORAL_TABLET | Freq: Every evening | ORAL | Status: DC
  Administered 2021-05-31 – 2021-06-02 (×3): 10 mg via ORAL
  Filled 2021-05-31 (×3): qty 1

## 2021-05-31 MED ORDER — KETOROLAC TROMETHAMINE 15 MG/ML IJ SOLN
7.5000 mg | Freq: Four times a day (QID) | INTRAMUSCULAR | Status: AC
  Administered 2021-05-31 – 2021-06-01 (×4): 7.5 mg via INTRAVENOUS
  Filled 2021-05-31 (×3): qty 1

## 2021-05-31 MED ORDER — ATORVASTATIN CALCIUM 20 MG PO TABS
40.0000 mg | ORAL_TABLET | Freq: Every evening | ORAL | Status: DC
  Administered 2021-05-31 – 2021-06-02 (×3): 40 mg via ORAL
  Filled 2021-05-31 (×4): qty 2

## 2021-05-31 MED ORDER — TORSEMIDE 10 MG PO TABS
20.0000 mg | ORAL_TABLET | Freq: Every day | ORAL | Status: DC
  Administered 2021-06-01 – 2021-06-02 (×2): 20 mg via ORAL
  Filled 2021-05-31 (×3): qty 2

## 2021-05-31 MED ORDER — NEOMYCIN-POLYMYXIN B GU 40-200000 IR SOLN
Status: AC
  Filled 2021-05-31: qty 4

## 2021-05-31 MED ORDER — LACTATED RINGERS IV SOLN: INTRAVENOUS | Status: DC

## 2021-05-31 MED ORDER — DABIGATRAN ETEXILATE MESYLATE 75 MG PO CAPS: 75.0000 mg | ORAL_CAPSULE | Freq: Two times a day (BID) | ORAL | Status: DC

## 2021-05-31 MED ORDER — DILTIAZEM HCL ER COATED BEADS 120 MG PO CP24: 120.0000 mg | ORAL_CAPSULE | Freq: Every day | ORAL | Status: DC

## 2021-05-31 MED ORDER — METOCLOPRAMIDE HCL 5 MG/ML IJ SOLN: 5.0000 mg | Freq: Three times a day (TID) | INTRAMUSCULAR | Status: DC | PRN

## 2021-05-31 MED ORDER — DOCUSATE SODIUM 100 MG PO CAPS
100.0000 mg | ORAL_CAPSULE | Freq: Two times a day (BID) | ORAL | Status: DC
  Administered 2021-05-31 – 2021-06-02 (×6): 100 mg via ORAL
  Filled 2021-05-31 (×7): qty 1

## 2021-05-31 MED ORDER — ALUM & MAG HYDROXIDE-SIMETH 200-200-20 MG/5ML PO SUSP: 30.0000 mL | ORAL | Status: DC | PRN

## 2021-05-31 MED ORDER — FENTANYL CITRATE (PF) 100 MCG/2ML IJ SOLN
25.0000 ug | INTRAMUSCULAR | Status: DC | PRN
  Administered 2021-05-31 (×4): 25 ug via INTRAVENOUS

## 2021-05-31 MED ORDER — PHENYLEPHRINE HCL-NACL 20-0.9 MG/250ML-% IV SOLN
INTRAVENOUS | Status: DC | PRN
  Administered 2021-05-31: 50 ug/min via INTRAVENOUS

## 2021-05-31 MED ORDER — CEFAZOLIN SODIUM-DEXTROSE 2-4 GM/100ML-% IV SOLN
INTRAVENOUS | Status: AC
  Filled 2021-05-31: qty 100

## 2021-05-31 MED ORDER — PRONTOSAN WOUND IRRIGATION OPTIME
TOPICAL | Status: DC | PRN
Start: 2021-05-31 — End: 2021-05-31
  Administered 2021-05-31: 1

## 2021-05-31 MED ORDER — RAMIPRIL 10 MG PO CAPS
10.0000 mg | ORAL_CAPSULE | Freq: Every day | ORAL | Status: DC
  Administered 2021-05-31 – 2021-06-02 (×3): 10 mg via ORAL
  Filled 2021-05-31 (×4): qty 1

## 2021-05-31 MED ORDER — DIPHENHYDRAMINE HCL 12.5 MG/5ML PO ELIX
12.5000 mg | ORAL_SOLUTION | ORAL | Status: DC | PRN
  Filled 2021-05-31: qty 10

## 2021-05-31 MED ORDER — CHLORHEXIDINE GLUCONATE 0.12 % MT SOLN: 15.0000 mL | Freq: Once | OROMUCOSAL | Status: AC

## 2021-05-31 MED ORDER — LIDOCAINE HCL (CARDIAC) PF 100 MG/5ML IV SOSY
PREFILLED_SYRINGE | INTRAVENOUS | Status: DC | PRN
  Administered 2021-05-31: 60 mg via INTRAVENOUS

## 2021-05-31 MED ORDER — SEMAGLUTIDE (1 MG/DOSE) 2 MG/1.5ML ~~LOC~~ SOPN: 1.0000 mg | PEN_INJECTOR | SUBCUTANEOUS | Status: DC

## 2021-05-31 MED ORDER — BUPIVACAINE-EPINEPHRINE (PF) 0.5% -1:200000 IJ SOLN
INTRAMUSCULAR | Status: AC
  Filled 2021-05-31: qty 30

## 2021-05-31 MED ORDER — CEFAZOLIN SODIUM-DEXTROSE 2-4 GM/100ML-% IV SOLN
2.0000 g | INTRAVENOUS | Status: AC
  Administered 2021-05-31: 2 g via INTRAVENOUS

## 2021-05-31 MED ORDER — MORPHINE SULFATE (PF) 4 MG/ML IV SOLN
0.5000 mg | INTRAVENOUS | Status: DC | PRN
  Administered 2021-06-01 – 2021-06-03 (×6): 1 mg via INTRAVENOUS
  Filled 2021-05-31 (×7): qty 1

## 2021-05-31 MED ORDER — SODIUM CHLORIDE (PF) 0.9 % IJ SOLN
INTRAMUSCULAR | Status: DC | PRN
  Administered 2021-05-31: 90 mL via TOPICAL

## 2021-05-31 MED ORDER — ONDANSETRON HCL 4 MG/2ML IJ SOLN: 4.0000 mg | Freq: Four times a day (QID) | INTRAMUSCULAR | Status: DC | PRN

## 2021-05-31 MED ORDER — DABIGATRAN ETEXILATE MESYLATE 75 MG PO CAPS
75.0000 mg | ORAL_CAPSULE | Freq: Two times a day (BID) | ORAL | Status: DC
  Filled 2021-05-31 (×6): qty 1

## 2021-05-31 MED ORDER — POVIDONE-IODINE 10 % EX SWAB: 2.0000 "application " | Freq: Once | CUTANEOUS | Status: DC

## 2021-05-31 MED ORDER — ONDANSETRON HCL 4 MG PO TABS
4.0000 mg | ORAL_TABLET | Freq: Four times a day (QID) | ORAL | Status: DC | PRN
  Filled 2021-05-31: qty 1

## 2021-05-31 MED ORDER — ORAL CARE MOUTH RINSE: 15.0000 mL | Freq: Once | OROMUCOSAL | Status: AC

## 2021-05-31 MED ORDER — BISACODYL 10 MG RE SUPP
10.0000 mg | Freq: Every day | RECTAL | Status: DC | PRN
  Administered 2021-06-02: 10 mg via RECTAL
  Filled 2021-05-31 (×2): qty 1

## 2021-05-31 MED ORDER — TRAMADOL HCL 50 MG PO TABS
50.0000 mg | ORAL_TABLET | Freq: Four times a day (QID) | ORAL | Status: DC | PRN
  Administered 2021-05-31 – 2021-06-02 (×5): 50 mg via ORAL
  Filled 2021-05-31 (×7): qty 1

## 2021-05-31 MED ORDER — PROPOFOL 10 MG/ML IV BOLUS
INTRAVENOUS | Status: DC | PRN
  Administered 2021-05-31: 20 mg via INTRAVENOUS
  Administered 2021-05-31: 100 mg via INTRAVENOUS

## 2021-05-31 MED ORDER — DILTIAZEM HCL ER COATED BEADS 240 MG PO CP24
240.0000 mg | ORAL_CAPSULE | Freq: Every day | ORAL | Status: DC
  Administered 2021-06-01 – 2021-06-03 (×3): 240 mg via ORAL
  Filled 2021-05-31 (×3): qty 1

## 2021-05-31 MED ORDER — SODIUM CHLORIDE 0.9 % IV SOLN: INTRAVENOUS | Status: DC

## 2021-05-31 SURGICAL SUPPLY — 65 items
AUGMENT TIBIAL SZ 5 LEFT (Knees) IMPLANT
BLADE SAGITTAL AGGR TOOTH XLG (BLADE) IMPLANT
BRUSH SCRUB EZ  4% CHG (MISCELLANEOUS) ×2
BRUSH SCRUB EZ 4% CHG (MISCELLANEOUS) ×2 IMPLANT
CHLORAPREP W/TINT 26 (MISCELLANEOUS) ×4 IMPLANT
COMP FEM OXINIUM LT SZ 7 (Femur) ×2 IMPLANT
COMP PATELLA FENESIS 32 OVAL (Stem) ×2 IMPLANT
COMPONENT FEM OXINIUM LT SZ 7 (Femur) IMPLANT
COMPONENT PTLLA GENS 32 OVAL (Stem) IMPLANT
CONE TIB ID LEGION  SHRT 24 (Orthopedic Implant) ×1 IMPLANT
CONE TIB ID LEGION SHRT 24 (Orthopedic Implant) IMPLANT
COOLER POLAR GLACIER W/PUMP (MISCELLANEOUS) ×2 IMPLANT
CUFF TOURN SGL QUICK 24 (TOURNIQUET CUFF)
CUFF TOURN SGL QUICK 34 (TOURNIQUET CUFF)
CUFF TRNQT CYL 24X4X16.5-23 (TOURNIQUET CUFF) IMPLANT
CUFF TRNQT CYL 34X4.125X (TOURNIQUET CUFF) IMPLANT
DRAPE 3/4 80X56 (DRAPES) ×2 IMPLANT
DRAPE INCISE IOBAN 66X60 STRL (DRAPES) ×2 IMPLANT
DRSG AQUACEL AG ADV 3.5X14 (GAUZE/BANDAGES/DRESSINGS) ×3 IMPLANT
GAUZE 4X4 16PLY ~~LOC~~+RFID DBL (SPONGE) ×2 IMPLANT
GAUZE XEROFORM 1X8 LF (GAUZE/BANDAGES/DRESSINGS) ×2 IMPLANT
GLOVE SURG ORTHO LTX SZ8 (GLOVE) ×4 IMPLANT
GLOVE SURG UNDER LTX SZ8 (GLOVE) ×4 IMPLANT
GOWN STRL REUS W/ TWL LRG LVL3 (GOWN DISPOSABLE) ×2 IMPLANT
GOWN STRL REUS W/ TWL XL LVL3 (GOWN DISPOSABLE) ×1 IMPLANT
GOWN STRL REUS W/TWL LRG LVL3 (GOWN DISPOSABLE) ×2
GOWN STRL REUS W/TWL XL LVL3 (GOWN DISPOSABLE) ×1
INSERT TIBIA CONSTRAIN 5-6 9MM (Insert) ×1 IMPLANT
IV NS 1000ML (IV SOLUTION) ×1
IV NS 1000ML BAXH (IV SOLUTION) ×1 IMPLANT
KIT TURNOVER KIT A (KITS) ×2 IMPLANT
LABEL OR SOLS (LABEL) ×2 IMPLANT
MANIFOLD NEPTUNE II (INSTRUMENTS) ×2 IMPLANT
MAT ABSORB  FLUID 56X50 GRAY (MISCELLANEOUS)
MAT ABSORB FLUID 56X50 GRAY (MISCELLANEOUS) ×1 IMPLANT
NDL SAFETY ECLIPSE 18X1.5 (NEEDLE) ×1 IMPLANT
NDL SPNL 20GX3.5 QUINCKE YW (NEEDLE) ×1 IMPLANT
NEEDLE HYPO 18GX1.5 SHARP (NEEDLE) ×1
NEEDLE SPNL 20GX3.5 QUINCKE YW (NEEDLE) ×2 IMPLANT
NS IRRIG 1000ML POUR BTL (IV SOLUTION) ×2 IMPLANT
PACK TOTAL KNEE (MISCELLANEOUS) ×2 IMPLANT
PAD ABD DERMACEA PRESS 5X9 (GAUZE/BANDAGES/DRESSINGS) ×2 IMPLANT
PAD DE MAYO PRESSURE PROTECT (MISCELLANEOUS) ×2 IMPLANT
PAD WRAPON POLAR KNEE (MISCELLANEOUS) ×2 IMPLANT
PULSAVAC PLUS IRRIG FAN TIP (DISPOSABLE) ×2
SPONGE T-LAP 18X18 ~~LOC~~+RFID (SPONGE) ×6 IMPLANT
STAPLER SKIN PROX 35W (STAPLE) ×2 IMPLANT
STEM PRESSFIT STR 13X160 KNEE (Stem) ×1 IMPLANT
STEM PRESSFIT TRT 18X160 (Stem) ×1 IMPLANT
SUCTION FRAZIER HANDLE 10FR (MISCELLANEOUS) ×1
SUCTION TUBE FRAZIER 10FR DISP (MISCELLANEOUS) ×1 IMPLANT
SUT DVC 2 QUILL PDO  T11 36X36 (SUTURE) ×1
SUT DVC 2 QUILL PDO T11 36X36 (SUTURE) ×1 IMPLANT
SUT VIC AB 2-0 CT1 18 (SUTURE) ×2 IMPLANT
SUT VIC AB 2-0 CT1 27 (SUTURE) ×1
SUT VIC AB 2-0 CT1 TAPERPNT 27 (SUTURE) ×1 IMPLANT
SUT VIC AB PLUS 45CM 1-MO-4 (SUTURE) ×2 IMPLANT
SYR 30ML LL (SYRINGE) ×4 IMPLANT
TIBIA SZ 5 LEFT (Knees) ×2 IMPLANT
TIP FAN IRRIG PULSAVAC PLUS (DISPOSABLE) ×1 IMPLANT
TOWER CARTRIDGE SMART MIX (DISPOSABLE) ×1 IMPLANT
TRAY FOLEY MTR SLVR 16FR STAT (SET/KITS/TRAYS/PACK) ×1 IMPLANT
WATER STERILE IRR 500ML POUR (IV SOLUTION) ×1 IMPLANT
WEDGE FEM SCREW-ON SZ7 5X5 (Orthopedic Implant) ×2 IMPLANT
WRAPON POLAR PAD KNEE (MISCELLANEOUS) ×4

## 2021-05-31 NOTE — Progress Notes (Signed)
Spoke with Dr Odis Luster about IV TXA and patients extensive cardiac history. Will change to topical TXA. CRNA notified. OR RN notified.

## 2021-05-31 NOTE — Anesthesia Preprocedure Evaluation (Signed)
Anesthesia Evaluation  Patient identified by MRN, date of birth, ID band Patient awake    Reviewed: Allergy & Precautions, H&P , NPO status , Patient's Chart, lab work & pertinent test results, reviewed documented beta blocker date and time   Airway Mallampati: III  TM Distance: >3 FB Neck ROM: full    Dental  (+) Teeth Intact   Pulmonary sleep apnea , former smoker,    Pulmonary exam normal        Cardiovascular Exercise Tolerance: Poor hypertension, On Medications + angina with exertion + CAD, + Past MI, +CHF and + DOE  + dysrhythmias Atrial Fibrillation  Rhythm:regular Rate:Normal  Clearance per Dr. [name] Stable status and no recent anginal equivalents   Neuro/Psych negative neurological ROS  negative psych ROS   GI/Hepatic negative GI ROS, Neg liver ROS,   Endo/Other  negative endocrine ROSdiabetes  Renal/GU Renal disease  negative genitourinary   Musculoskeletal   Abdominal   Peds  Hematology  (+) Blood dyscrasia, anemia ,   Anesthesia Other Findings Past Medical History: No date: A-fib (HCC)     Comment:  a.) CHA2DS2-VASc = 6 (age x 2, CHF, HTN, previous MI,               T2DM). b.) Rate/rhythm maintained on amiodarone +               diltiazem + carvedilol; chronically anticoagulated using               dabigatran. c.) DCCV 07/10/2015, 08/08/2019, 10/22/2019. No date: Arthritis No date: CHF (congestive heart failure) (HCC) No date: CKD (chronic kidney disease), stage III ([place]) 1991: Coronary artery disease     Comment:  a.) 2v CABG in 1991. b.) PCI 06/07/2006 --> [facility]               stents to native LCx and RCA No date: Hyperlipidemia No date: Hypertension No date: Long term current use of anticoagulant     Comment:  a.) dabigatran 1991: Myocardial infarction ([place])     Comment:  a.) LHC --> 50% and 90% LAD lesions; referred to CVTS.               b.) ultimately underwent 2v CABG (LIMA-LAD,  SVG-RCA) No date: OSA on CPAP 01/02/1990: S/P CABG x 2     Comment:  a.) LIMA-LAD, SVG-RCA No date: T2DM (type 2 diabetes mellitus) ([place]) Past Surgical History: 1970: BACK SURGERY     Comment:  L5-L6 ruptured disk 2000: BLADDER SURGERY 07/10/2015: CARDIOVERSION; N/A 08/08/2019: CARDIOVERSION; N/A 10/22/2019: CARDIOVERSION; N/A No date: COLON SURGERY 01/15/2020: COLONOSCOPY; N/A     Comment:  Procedure: COLONOSCOPY;  Surgeon: Virgel Manifold,               MD;  Location: [facility] ENDOSCOPY;  Service: Endoscopy;                Laterality: N/A; 01/16/2020: COLONOSCOPY; N/A     Comment:  Procedure: COLONOSCOPY;  Surgeon: Virgel Manifold,               MD;  Location: [facility] ENDOSCOPY;  Service: Endoscopy;                Laterality: N/A; 01/02/1990: CORONARY ARTERY BYPASS GRAFT; N/A     Comment:  Procedure: 2v CORONARY ARTERY BYPASS GRAFT (LIMA-LAD,               SVG-RCA) 01/13/2020: ESOPHAGOGASTRODUODENOSCOPY; N/A     Comment:  Procedure: ESOPHAGOGASTRODUODENOSCOPY (  EGD);  Surgeon:               Virgel Manifold, MD;  Location: [facility] ENDOSCOPY;                Service: Endoscopy;  Laterality: N/A; 2008: REPLACEMENT TOTAL KNEE BILATERAL; Bilateral 2008: STENT PLACEMENT VASCULAR ([place] HX) 01/16/2020: TOTAL KNEE REVISION; Left     Comment:  Procedure: TOTAL KNEE REVISION;  Surgeon: Lovell Sheehan, MD;  Location: [facility] ORS;  Service: Orthopedics;                Laterality: Left; BMI    Body Mass Index: 33.25 kg/m     Reproductive/Obstetrics negative OB ROS                             Anesthesia Physical Anesthesia Plan  ASA: 3  Anesthesia Plan: General ETT   Post-op Pain Management: Regional block   Induction:   PONV Risk Score and Plan: 3  Airway Management Planned:   Additional Equipment:   Intra-op Plan:   Post-operative Plan:   Informed Consent: I have reviewed the patients History and Physical, chart, labs and  discussed the procedure including the risks, benefits and alternatives for the proposed anesthesia with the patient or authorized representative who has indicated his/her understanding and acceptance.     Dental Advisory Given  Plan Discussed with: CRNA  Anesthesia Plan Comments: (Pt accepts risks and benefits of adductor canal or femoral nerve block in pacu . ja)        Anesthesia Quick Evaluation

## 2021-05-31 NOTE — Op Note (Signed)
DATE OF SURGERY:  05/31/2021 TIME: 10:32 AM  PATIENT NAME:  Randall Vincent   AGE: 81 y.o.    PRE-OPERATIVE DIAGNOSIS:  Painful left total knee with spacer for prior infection  POST-OPERATIVE DIAGNOSIS:  Same  PROCEDURE:  Procedure(s): TOTAL KNEE REVISION, LEFT  SURGEON:  Lyndle Herrlich, MD   ASSISTANT:  Altamese Cabal,  PA-C  OPERATIVE IMPLANTS: [employer], Revision tibia tray size 5, 13 mm x 160 mm non-cemented stem, 24 mm short cone. Constrained revision femur size 7 with two 5 mm x 5 mm distal/posterior augments, 18 mm x 160 mm non-cemented stem, 9 mm constrained insert and 32 mm oval dome patella   PREOPERATIVE INDICATIONS:  Randall Vincent is an 81 y.o. 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    The risks, benefits, and alternatives were discussed at length including but not limited to the risks of infection, bleeding, nerve or blood vessel injury, knee stiffness, fracture, dislocation, loosening or failure of the hardware and the need for further surgery. Medical risks include but not limited to DVT and pulmonary embolism, myocardial infarction, stroke, pneumonia, respiratory failure and death. I discussed these risks with the patient in my office prior to the date of surgery. They understood these risks and were willing to proceed.  OPERATIVE FINDINGS AND UNIQUE ASPECTS OF THE CASE:  Cement spacer along distal femur and proximal tibia was intact. Femoral and tibial components were in good condition.   OPERATIVE DESCRIPTION:  The patient was brought to the operative room and placed in a supine position after undergoing placement of a general anesthetic. IV  antibiotics were given. Patient received tranexamic acid. The lower extremity was prepped and draped in the usual sterile fashion.  A time out was performed to verify the patient's name, date of birth, medical record number, correct site of surgery and correct procedure to be performed. The timeout was also used to confirm the patient received antibiotics and that appropriate instruments, implants and radiographs studies were available in the room.  The leg was elevated and exsanguinated with an Esmarch and the tourniquet was inflated to 250 mmHg.  A midline incision was made over the left knee.. A medial parapatellar arthrotomy was then made and the patella subluxed laterally and the knee was brought into 90 of flexion. The medial and lateral gutters were debrided of extensive scar tissue with electrocautery.   The femoral component was removed with osteotomes, and bone tamp. Cement was debrided from the bone with rongeurs. The tibial component and cement spacer was removed with osteotomes and passed from the field.  Attention was then turned to preparation of the patella. Extensive scar and osteophytes were removed. The thickness of the patella was measured with a caliper, the diameter measured with the patella templates.  The patella resection was then made with an oscillating saw using the patella cutting guide.  The 32 mm button fit appropriately.  3 peg holes for the patella component were then drilled.  The tibia was reamed to size 13 mm and cut with the saw using the intramedullary guide. The tibial cone was then prepared with reamers and broached to a 24 mm short. The trail was placed and the size 5 fit appropriately with a neutral sleeve.  The femur was reamed to size 18 mm and the distal cut performed using the intramedullary guide. The 5 mm augments were used distally and posteriorly. The box was cut with an osteotome. The size 7 femur fit appropriately.  The trials were then placed. Knee  was taken through a full range of motion and deemed to be stable with the trial components. All trial components were then removed.  The joint was copiously irrigated with pulse lavage.  The final total knee revision arthroplasty components were then cemented into place. The knee was held in extension while cement was allowed to cure.The knee was taken through a range of motion and the patella tracked well and the knee was again irrigated copiously.  The knee capsule was then injected with Exparel.  The medial arthrotomy was closed with #1 Vicryl and #2 Quill. The subcutaneous tissue closed with  2-0 vicryl, and skin approximated with staples.  A dry sterile and compressive dressing was applied.  A Polar Care was applied to the operative knee.  The patient was awakened and brought to the PACU in stable and satisfactory condition.  All sharp, lap and instrument counts were correct at the conclusion the case. I spoke with the patient's family in the postop consultation room to let them know the case had been performed without complication and the patient was stable in recovery room.   Total tourniquet time was 104 minutes.

## 2021-05-31 NOTE — H&P (Signed)
The patient has been re-examined, and the chart reviewed, and there have been no interval changes to the documented history and physical.  Plan a left total knee revision today.  Anesthesia is consulted regarding a peripheral nerve block for post-operative pain.  The risks, benefits, and alternatives have been discussed at length, and the patient is willing to proceed.     

## 2021-05-31 NOTE — Transfer of Care (Signed)
Immediate Anesthesia Transfer of Care Note  Patient: Randall Vincent  Procedure(s) Performed: TOTAL KNEE REVISION (Left: Knee)  Patient Location: PACU  Anesthesia Type:General  Level of Consciousness: awake, drowsy and patient cooperative  Airway & Oxygen Therapy: Patient Spontanous Breathing and Patient connected to face mask oxygen  Post-op Assessment: Report given to RN and Post -op Vital signs reviewed and stable  Post vital signs: Reviewed and stable  Last Vitals:  Vitals Value Taken Time  BP 111/56 05/31/21 1045  Temp 36.8 C 05/31/21 1045  Pulse 58 05/31/21 1049  Resp 22 05/31/21 1049  SpO2 99 % 05/31/21 1049  Vitals shown include unvalidated device data.  Last Pain:  Vitals:   05/31/21 1045  PainSc: Asleep         Complications: No notable events documented.

## 2021-05-31 NOTE — Evaluation (Signed)
Physical Therapy Evaluation Patient Details Name: Randall Vincent MRN: [id number] DOB: [birth year] Today's Date: 05/31/2021  History of Present Illness  81 y/o male s/p left toal knee revision due to painful left total knee with spacer for prior infection. Original TKA in 2008. Spacer placed in 2022.   Clinical Impression  Pt received supine in bed, pleasant and agreeable to therapy. He lives alone in a camper however will be staying at his son's house at d/c. His son lives in a single level home with 4 STE. Pt sleeps in a recliner at baseline due to comfort. 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  Pt performed all mobility with SUP for safety. He used RW for STS and ambulation. Pt remained in room for ambulation per pt request for an ambulatory toilet transfer, 65ft each way. He also performed all supine therapeutic exercises in the TKA packet. VC provided for hand placement during STS. Otherwise safe technique demonstrated. Will perform stairs and longer distance ambulation in tomorrow's session. Pt returned to bed (per request), RN notified of mobility status and encouraged pt to sit in recliner later this date. Rec HHPT to follow up at d/c for ROM, strengthening and improved functional use of LLE.        Recommendations for follow up therapy are one component of a multi-disciplinary discharge planning process, led by the attending physician.  Recommendations may be updated based on patient status, additional functional criteria and insurance authorization.  Follow Up Recommendations Home health PT    Assistance Recommended at Discharge Intermittent Supervision/Assistance  Patient can return home with the following  A little help with walking and/or transfers;A little help with bathing/dressing/bathroom;Help with stairs or ramp for entrance    Equipment Recommendations None recommended by PT  Recommendations for Other Services       Functional Status Assessment Patient  has had a recent decline in their functional status and demonstrates the ability to make significant improvements in function in a reasonable and predictable amount of time.     Precautions / Restrictions Precautions Precautions: Fall Restrictions Weight Bearing Restrictions: Yes LLE Weight Bearing: Weight bearing as tolerated      Mobility  Bed Mobility Overal bed mobility: Needs Assistance Bed Mobility: Supine to Sit;Sit to Supine     Supine to sit: Supervision;HOB elevated Sit to supine: Supervision;HOB elevated   General bed mobility comments: increased time    Transfers Overall transfer level: Needs assistance Equipment used: Rolling walker (2 wheels) Transfers: Sit to/from Stand Sit to Stand: Supervision           General transfer comment: SUP for safety. VC on hand placement.    Ambulation/Gait Ambulation/Gait assistance: Supervision Gait Distance (Feet): 15 Feet Assistive device: Rolling walker (2 wheels) Gait Pattern/deviations: Step-through pattern;Decreased weight shift to left;Decreased stride length       General Gait Details: 76ft x2 reps for ambulatory toilet transfer. SUP for safety using RW. PT managed IV pole. Safe technique.  Stairs            Wheelchair Mobility    Modified Rankin (Stroke Patients Only)       Balance Overall balance assessment: Needs assistance   Sitting balance-Leahy Scale: Good       Standing balance-Leahy Scale: Fair Standing balance comment: Ambulates using RW however not reliant on AD during static standing  Pertinent Vitals/Pain Pain Assessment: 0-10 Pain Score: 4  Pain Descriptors / Indicators: Aching;Dull Pain Intervention(s): Limited activity within patient's tolerance;Monitored during session;Premedicated before session;Repositioned;Ice applied    Home Living Family/patient expects to be discharged to:: Private residence Living Arrangements: Alone (will  be staying at his son's house at d/c) Available Help at Discharge: Family;Available 24 hours/day Type of Home: House Home Access: Stairs to enter Entrance Stairs-Rails: Left Entrance Stairs-Number of Steps: 4   Home Layout: One level Home Equipment: Agricultural consultant (2 wheels);Cane - single point;Other (comment);Grab bars - tub/shower;BSC/3in1;Tub bench (lift chair) Additional Comments: Above information is regarding pt son's house as this is where he plans to d/c.    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 1 fall in the last 6 months - pt stepped up onto curb leading with LLE and knee buckled resulting in R wrist fx (fully healed). ADLs Comments: Reports independent. Drives and performs his own IADLs including grocery shopping.     Hand Dominance        Extremity/Trunk Assessment   Upper Extremity Assessment Upper Extremity Assessment: Overall WFL for tasks assessed    Lower Extremity Assessment Lower Extremity Assessment: Overall WFL for tasks assessed;LLE deficits/detail LLE Deficits / Details: s/p L TKA revision       Communication      Cognition Arousal/Alertness: Awake/alert Behavior During Therapy: WFL for tasks assessed/performed Overall Cognitive Status: Within Functional Limits for tasks assessed                                 General Comments: A&Ox4. Pleasant.        General Comments      Exercises Total Joint Exercises Ankle Circles/Pumps: AROM;Strengthening;Both;20 reps;Supine Quad Sets: Strengthening;15 reps;Left;Supine;AROM Short Arc Quad: AROM;Strengthening;Left;15 reps;Supine Heel Slides: AROM;Strengthening;Left;15 reps;Supine Hip ABduction/ADduction: Strengthening;Left;15 reps;Supine;AROM Straight Leg Raises: Strengthening;Left;15 reps;Supine;AROM   Assessment/Plan    PT  Assessment Patient needs continued PT services  PT Problem List Decreased strength;Decreased mobility;Decreased range of motion;Decreased balance;Decreased activity tolerance       PT Treatment Interventions DME instruction;Therapeutic activities;Gait training;Stair training;Balance training;Functional mobility training;Therapeutic exercise    PT Goals (Current goals can be found in the Care Plan section)  Acute Rehab PT Goals Patient Stated Goal: to walk without pain PT Goal Formulation: With patient Time For Goal Achievement: 06/14/21 Potential to Achieve Goals: Good    Frequency BID     Co-evaluation               AM-PAC PT "6 Clicks" Mobility  Outcome Measure Help needed turning from your back to your side while in a flat bed without using bedrails?: A Little Help needed moving from lying on your back to sitting on the side of a flat bed without using bedrails?: A Little Help needed moving to and from a bed to a chair (including a wheelchair)?: A Little Help needed standing up from a chair using your arms (e.g., wheelchair or bedside chair)?: A Little Help needed to walk in hospital room?: A Little Help needed climbing 3-5 steps with a railing? : A Little 6 Click Score: 18    End of Session Equipment Utilized During Treatment: Gait belt Activity Tolerance: Patient tolerated treatment well Patient left: in bed;with call bell/phone within reach;with bed alarm set;with SCD's  reapplied Nurse Communication: Mobility status;Weight bearing status;Precautions PT Visit Diagnosis: Other abnormalities of gait and mobility (R26.89);Difficulty in walking, not elsewhere classified (R26.2);Muscle weakness (generalized) (M62.81)    Time: [id number] PT Time Calculation (min) (ACUTE ONLY): 42 min   Charges:   PT Evaluation $PT Eval Low Complexity: 1 Low PT Treatments $Gait Training: 8-22 mins $Therapeutic Exercise: 8-22 mins       Basilia JumboKerri Harlie Ragle PT, DPT 05/31/21 4:53  PM [phone]

## 2021-05-31 NOTE — Progress Notes (Signed)
Informed daughter, pt will be going to room 148

## 2021-05-31 NOTE — Anesthesia Postprocedure Evaluation (Signed)
Anesthesia Post Note  Patient: Randall Vincent  Procedure(s) Performed: TOTAL KNEE REVISION (Left: Knee)  Patient location during evaluation: PACU Anesthesia Type: General Level of consciousness: awake and alert Pain management: pain level controlled Vital Signs Assessment: post-procedure vital signs reviewed and stable Respiratory status: spontaneous breathing, nonlabored ventilation, respiratory function stable and patient connected to nasal cannula oxygen Cardiovascular status: blood pressure returned to baseline and stable Postop Assessment: no apparent nausea or vomiting Anesthetic complications: no   No notable events documented.   Last Vitals:  Vitals:   05/31/21 1236 05/31/21 1527  BP: 109/63 (!) 112/56  Pulse: (!) 57 (!) 57  Resp: 16 18  Temp: 36.5 C 36.8 C  SpO2: 93% 95%    Last Pain:  Vitals:   05/31/21 1341  PainSc: Asleep                 Martha Clan

## 2021-05-31 NOTE — Anesthesia Procedure Notes (Signed)
Procedure Name: Intubation Date/Time: 05/31/2021 7:45 AM Performed by: Lowry Bowl, CRNA Pre-anesthesia Checklist: Patient identified, Emergency Drugs available, Suction available and Patient being monitored Patient Re-evaluated:Patient Re-evaluated prior to induction Oxygen Delivery Method: Circle system utilized Preoxygenation: Pre-oxygenation with 100% oxygen Induction Type: IV induction Ventilation: Mask ventilation without difficulty Laryngoscope Size: McGraph and 4 Grade View: Grade I Tube type: Oral Tube size: 7.5 mm Number of attempts: 1 Airway Equipment and Method: Stylet and Video-laryngoscopy Placement Confirmation: ETT inserted through vocal cords under direct vision, positive ETCO2 and breath sounds checked- equal and bilateral Secured at: 23 cm Tube secured with: Tape Dental Injury: Teeth and Oropharynx as per pre-operative assessment

## 2021-06-01 ENCOUNTER — Encounter: Payer: Self-pay | Admitting: Orthopedic Surgery

## 2021-06-01 LAB — CBC
HCT: 29.5 % — ABNORMAL LOW (ref 39.0–52.0)
Hemoglobin: 8.9 g/dL — ABNORMAL LOW (ref 13.0–17.0)
MCH: 26.1 pg (ref 26.0–34.0)
MCHC: 30.2 g/dL (ref 30.0–36.0)
MCV: 86.5 fL (ref 80.0–100.0)
Platelets: 157 10*3/uL (ref 150–400)
RBC: 3.41 MIL/uL — ABNORMAL LOW (ref 4.22–5.81)
RDW: 17.5 % — ABNORMAL HIGH (ref 11.5–15.5)
WBC: 8.8 10*3/uL (ref 4.0–10.5)
nRBC: 0 % (ref 0.0–0.2)

## 2021-06-01 LAB — BASIC METABOLIC PANEL
Anion gap: 7 (ref 5–15)
BUN: 38 mg/dL — ABNORMAL HIGH (ref 8–23)
CO2: 27 mmol/L (ref 22–32)
Calcium: 8.2 mg/dL — ABNORMAL LOW (ref 8.9–10.3)
Chloride: 101 mmol/L (ref 98–111)
Creatinine, Ser: 2.25 mg/dL — ABNORMAL HIGH (ref 0.61–1.24)
GFR, Estimated: 29 mL/min — ABNORMAL LOW (ref >60)
Glucose, Bld: 167 mg/dL — ABNORMAL HIGH (ref 70–99)
Potassium: 4.5 mmol/L (ref 3.5–5.1)
Sodium: 135 mmol/L (ref 135–145)

## 2021-06-01 LAB — HEMOGLOBIN A1C
Hgb A1c MFr Bld: 6.5 % — ABNORMAL HIGH (ref 4.8–5.6)
Mean Plasma Glucose: 139.85 mg/dL

## 2021-06-01 LAB — GLUCOSE, CAPILLARY
Glucose-Capillary: 124 mg/dL — ABNORMAL HIGH (ref 70–99)
Glucose-Capillary: 125 mg/dL — ABNORMAL HIGH (ref 70–99)

## 2021-06-01 MED ORDER — INSULIN ASPART 100 UNIT/ML IJ SOLN: 0.0000 [IU] | Freq: Every day | INTRAMUSCULAR | Status: DC

## 2021-06-01 MED ORDER — INSULIN ASPART 100 UNIT/ML IJ SOLN
0.0000 [IU] | Freq: Three times a day (TID) | INTRAMUSCULAR | Status: DC
  Administered 2021-06-01 – 2021-06-02 (×3): 1 [IU] via SUBCUTANEOUS
  Filled 2021-06-01 (×4): qty 1

## 2021-06-01 NOTE — Progress Notes (Addendum)
Met with the patient at the bedside to discuss DC plan and needs He has DME at home including a rolling walker and 3 in 1, He does not need additional DME He has had HH in the past but not sure which agency, I reached out to [facility] [place] to see if they have had the patient and check to see if they will accept the patient, awaiting a response He lives alone and drives, He will be staying with his son for at least 3 weeks His son will provide transportation He can afford his medicaiton TOC to continue to monitor for needs Plans to go home on [place] with [facility] notified me that they can accept the patient for [facility] services

## 2021-06-01 NOTE — Progress Notes (Signed)
PT Cancellation Note  Patient Details Name: Randall Vincent MRN: [id number] DOB: [birth year]   Cancelled Treatment:    Reason Eval/Treat Not Completed: Other (comment). Pt supine in bed, visitor in room. PT notices drainage in dressing of L knee that has spread to bed linens and pt gown. Pt states RN is aware and is waiting for her to return to address drainage and bring pain meds.  Pt concerned and asks PT to speak to RN who states she will be in shortly. PT will return at later time this date.    Basilia Jumbo PT, DPT 06/01/21 10:52 AM [phone]

## 2021-06-01 NOTE — Progress Notes (Addendum)
°  Subjective:  Patient reports pain as mild.    Objective:   VITALS:   Vitals:   05/31/21 2040 05/31/21 2346 06/01/21 0520 06/01/21 1238  BP: (!) 128/55 140/69  (!) 156/77  Pulse: (!) 56 70  86  Resp: 18 20  14   Temp: (!) 97.4 F (36.3 C) 97.9 F (36.6 C)  98.3 F (36.8 C)  TempSrc:  Oral    SpO2: 97% 98%  99%  Weight:      Height:   5\' 8"  (1.727 m)     PHYSICAL EXAM:  ABD soft Sensation intact distally Dorsiflexion/Plantar flexion intact Incision: moderate drainage No cellulitis present Compartment soft  LABS  Results for orders placed or performed during the hospital encounter of 05/31/21 (from the past 24 hour(s))  CBC     Status: Abnormal   Collection Time: 06/01/21  4:32 AM  Result Value Ref Range   WBC 8.8 4.0 - 10.5 K/uL   RBC 3.41 (L) 4.22 - 5.81 MIL/uL   Hemoglobin 8.9 (L) 13.0 - 17.0 g/dL   HCT 29.5 (L) 39.0 - 52.0 %   MCV 86.5 80.0 - 100.0 fL   MCH 26.1 26.0 - 34.0 pg   MCHC 30.2 30.0 - 36.0 g/dL   RDW 17.5 (H) 11.5 - 15.5 %   Platelets 157 150 - 400 K/uL   nRBC 0.0 0.0 - 0.2 %  Basic metabolic panel     Status: Abnormal   Collection Time: 06/01/21  4:32 AM  Result Value Ref Range   Sodium 135 135 - 145 mmol/L   Potassium 4.5 3.5 - 5.1 mmol/L   Chloride 101 98 - 111 mmol/L   CO2 27 22 - 32 mmol/L   Glucose, Bld 167 (H) 70 - 99 mg/dL   BUN 38 (H) 8 - 23 mg/dL   Creatinine, Ser 2.25 (H) 0.61 - 1.24 mg/dL   Calcium 8.2 (L) 8.9 - 10.3 mg/dL   GFR, Estimated 29 (L) >60 mL/min   Anion gap 7 5 - 15    DG Knee Left Port  Result Date: 05/31/2021 CLINICAL DATA:  Postop LEFT knee surgery EXAM: PORTABLE LEFT KNEE - 1-2 VIEW COMPARISON:  Portable exam 1057 hours compared 01/11/2020 FINDINGS: Interval revision of LEFT knee arthroplasty with long stemmed femoral and tibial components now identified. Diffuse osseous demineralization. No acute fracture, dislocation, or bone destruction. IMPRESSION: LEFT knee prosthesis without acute abnormalities.  Electronically Signed   By: Lavonia Dana M.D.   On: 05/31/2021 11:20    Assessment/Plan: 1 Day Post-Op   Principal Problem:   Status post revision of total knee replacement, left Obesity Chronic Diastolic Heart Failure Diabetes mellitus type II  with hyperglycemia  Advance diet Up with therapy Plan for discharge tomorrow Dressing changed Hold anticoagulation for today, OK to resume tomorrow   Lovell Sheehan , MD 06/01/2021, 12:55 PM

## 2021-06-01 NOTE — Progress Notes (Signed)
Dressing reinforced. Breakthrough bleeding.

## 2021-06-01 NOTE — Plan of Care (Signed)
°  Problem: Health Behavior/Discharge Planning: Goal: Ability to manage health-related needs will improve Outcome: Progressing   Problem: Clinical Measurements: Goal: Ability to maintain clinical measurements within normal limits will improve Outcome: Progressing Goal: Will remain free from infection Outcome: Progressing Goal: Respiratory complications will improve Outcome: Progressing   Problem: Activity: Goal: Risk for activity intolerance will decrease Outcome: Progressing   Problem: Elimination: Goal: Will not experience complications related to urinary retention Outcome: Progressing   Problem: Pain Managment: Goal: General experience of comfort will improve Outcome: Progressing   Problem: Safety: Goal: Ability to remain free from injury will improve Outcome: Progressing   Problem: Activity: Goal: Ability to avoid complications of mobility impairment will improve Outcome: Progressing   Problem: Skin Integrity: Goal: Risk for impaired skin integrity will decrease Outcome: Progressing   Problem: Pain Management: Goal: Pain level will decrease with appropriate interventions Outcome: Progressing

## 2021-06-01 NOTE — Progress Notes (Signed)
PT Cancellation Note  Patient Details Name: Randall Vincent MRN: [id number] DOB: [birth year]   Cancelled Treatment:    Reason Eval/Treat Not Completed: Medical issues which prohibited therapy. Pt supine in bed, new dressing applied to surgical site after discharge noticed this morning. Pt states MD advised no strenuous activity for the rest of the day. Secure messaged MD Dr. Odis Luster who states to wait until tomorrow. PT will follow up tomorrow as pt is appropriate.    Basilia Jumbo PT, DPT 06/01/21 4:30 PM [phone]

## 2021-06-02 LAB — CBC
HCT: 29 % — ABNORMAL LOW (ref 39.0–52.0)
Hemoglobin: 9 g/dL — ABNORMAL LOW (ref 13.0–17.0)
MCH: 26.1 pg (ref 26.0–34.0)
MCHC: 31 g/dL (ref 30.0–36.0)
MCV: 84.1 fL (ref 80.0–100.0)
Platelets: 161 10*3/uL (ref 150–400)
RBC: 3.45 MIL/uL — ABNORMAL LOW (ref 4.22–5.81)
RDW: 17.7 % — ABNORMAL HIGH (ref 11.5–15.5)
WBC: 7.5 10*3/uL (ref 4.0–10.5)
nRBC: 0 % (ref 0.0–0.2)

## 2021-06-02 LAB — GLUCOSE, CAPILLARY
Glucose-Capillary: 126 mg/dL — ABNORMAL HIGH (ref 70–99)
Glucose-Capillary: 115 mg/dL — ABNORMAL HIGH (ref 70–99)
Glucose-Capillary: 137 mg/dL — ABNORMAL HIGH (ref 70–99)
Glucose-Capillary: 149 mg/dL — ABNORMAL HIGH (ref 70–99)

## 2021-06-02 MED ORDER — HYDROCODONE-ACETAMINOPHEN 5-325 MG PO TABS
1.0000 | ORAL_TABLET | Freq: Three times a day (TID) | ORAL | Status: DC | PRN
  Administered 2021-06-02 – 2021-06-03 (×2): 1 via ORAL
  Filled 2021-06-02 (×2): qty 1

## 2021-06-02 NOTE — TOC Progression Note (Signed)
Transition of Care [facility]) - Progression Note    Patient Details  Name: Randall Vincent MRN: [id number] Date of Birth: 1941/03/15  Transition of Care [facility]) CM/SW Contact  Marlowe Sax, RN Phone Number: 06/02/2021, 10:12 AM  Clinical Narrative:   The patient has DME from [employer] delivered, his Son will provide transportation, [facility] is [facility] for [facility], ready for DC    Expected Discharge Plan: Home w Home Health Services Barriers to Discharge: Continued Medical Work up  Expected Discharge Plan and Services Expected Discharge Plan: Home w Home Health Services   Discharge Planning Services: CM Consult   Living arrangements for the past 2 months: Single Family Home                 DME Arranged: N/A         HH Arranged: PT HH Agency: [facility] Date HH Agency Contacted: 06/01/21 Time HH Agency Contacted: 1304 Representative spoke with at [facility] Agency: Barbara Cower   Social Determinants of Health (SDOH) Interventions    Readmission Risk Interventions No flowsheet data found.

## 2021-06-02 NOTE — Progress Notes (Signed)
°  Subjective:  Patient reports pain as mild to moderate.    Objective:   VITALS:   Vitals:   06/01/21 2020 06/02/21 0500 06/02/21 0544 06/02/21 1214  BP: (!) 127/57 140/63 140/63 121/60  Pulse: 71 85 88 69  Resp: 20 20 20 16   Temp: 98.6 F (37 C) 97.8 F (36.6 C) 97.8 F (36.6 C) 97.7 F (36.5 C)  TempSrc:   Oral   SpO2: 94% 92% 92% 96%  Weight:      Height:        PHYSICAL EXAM:  Neurologically intact ABD soft Neurovascular intact Sensation intact distally Intact pulses distally Dorsiflexion/Plantar flexion intact Incision: dressing C/D/I No cellulitis present Compartment soft  LABS  Results for orders placed or performed during the hospital encounter of 05/31/21 (from the past 24 hour(s))  Glucose, capillary     Status: Abnormal   Collection Time: 06/01/21  5:51 PM  Result Value Ref Range   Glucose-Capillary 124 (H) 70 - 99 mg/dL  Glucose, capillary     Status: Abnormal   Collection Time: 06/01/21 10:24 PM  Result Value Ref Range   Glucose-Capillary 125 (H) 70 - 99 mg/dL  CBC     Status: Abnormal   Collection Time: 06/02/21  5:20 AM  Result Value Ref Range   WBC 7.5 4.0 - 10.5 K/uL   RBC 3.45 (L) 4.22 - 5.81 MIL/uL   Hemoglobin 9.0 (L) 13.0 - 17.0 g/dL   HCT 07/31/21 (L) 29.5 - 28.4 %   MCV 84.1 80.0 - 100.0 fL   MCH 26.1 26.0 - 34.0 pg   MCHC 31.0 30.0 - 36.0 g/dL   RDW 13.2 (H) 44.0 - 10.2 %   Platelets 161 150 - 400 K/uL   nRBC 0.0 0.0 - 0.2 %  Glucose, capillary     Status: Abnormal   Collection Time: 06/02/21  8:29 AM  Result Value Ref Range   Glucose-Capillary 126 (H) 70 - 99 mg/dL  Glucose, capillary     Status: Abnormal   Collection Time: 06/02/21 12:17 PM  Result Value Ref Range   Glucose-Capillary 115 (H) 70 - 99 mg/dL    No results found.  Assessment/Plan: 2 Days Post-Op   Principal Problem:   Status post revision of total knee replacement, left   Up with therapy WBAT LLE Plan for D/C tomorrow   07/31/21 ,  PA-C 06/02/2021, 12:22 PM

## 2021-06-02 NOTE — Progress Notes (Signed)
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 Original TKA in 2008. Spacer placed in 2022.    PT Comments    Pt received supine in bed resting, agreeable to therapy. He sat up to EOB w/o difficulty. PT assisted with donning shoes. STS from EOB and ambulation performed with SUP. He did have more difficulty standing from lower surface of w/c requiring CGA and PT to steady w/c. Pt demonstrates ability to navigate steps required to enter and exit home. Increased safety and ease when using BUE support on L railing to ascend. Pt ended session with 152ft of ambulation prior to returning to bed. Would benefit from skilled PT to address above deficits and promote optimal return to PLOF.     Recommendations for follow up therapy are one component of a multi-disciplinary discharge planning process, led by the attending physician.  Recommendations may be updated based on patient status, additional functional criteria and insurance authorization.  Follow Up Recommendations  Home health PT     Assistance Recommended at Discharge Intermittent Supervision/Assistance  Patient can return home with the following A little help with walking and/or transfers;A little help with bathing/dressing/bathroom;Help with stairs or ramp for entrance   Equipment Recommendations  None recommended by PT    Recommendations for Other Services       Precautions / Restrictions Restrictions Weight Bearing Restrictions: Yes LLE Weight Bearing: Weight bearing as tolerated     Mobility  Bed Mobility Overal bed mobility: Modified Independent Bed Mobility: Supine to Sit           General bed mobility comments: Sup>sit mod I. Seated EOB at end of session.    Transfers Overall transfer level: Needs  assistance Equipment used: Rolling walker (2 wheels) Transfers: Sit to/from Stand Sit to Stand: Min guard           General transfer comment: CGA to stand from w/c. SUP for EOB.    Ambulation/Gait Ambulation/Gait assistance: Supervision Gait Distance (Feet): 130 Feet Assistive device: Rolling walker (2 wheels) Gait Pattern/deviations: Step-through pattern;Decreased weight shift to left;Decreased stride length       General Gait Details: 153ft with decreased cadence. Performed after stairs   Stairs Stairs: Yes Stairs assistance: Min guard Stair Management: One rail Left;Forwards;Sideways Number of Stairs: 4 General stair comments: 4 steps performed forward to ascend and laterally to descend. Pt attempted using single UE assist with CGA to steady and difficulty clearing step. VC to use BUE support - pt performed with increased ease and safety.   Wheelchair Mobility    Modified Rankin (Stroke Patients Only)       Balance Overall balance assessment: Needs assistance   Sitting balance-Leahy Scale: Good       Standing balance-Leahy Scale: Fair Standing balance comment: Ambulates using RW however not reliant on AD during static standing                            Cognition Arousal/Alertness: Awake/alert Behavior During Therapy: WFL for tasks assessed/performed Overall Cognitive Status: Within Functional Limits for tasks assessed  Exercises      General Comments        Pertinent Vitals/Pain Pain Assessment: 0-10 Pain Score: 4  Pain Location: L knee Pain Descriptors / Indicators: Aching;Dull Pain Intervention(s): Limited activity within patient's tolerance;Monitored during session;Premedicated before session;Ice applied    Home Living                          Prior Function            PT Goals (current goals can now be found in the care plan section) Acute Rehab PT  Goals Patient Stated Goal: to walk without pain PT Goal Formulation: With patient Time For Goal Achievement: 06/14/21 Potential to Achieve Goals: Good    Frequency    BID      PT Plan      Co-evaluation              AM-PAC PT "6 Clicks" Mobility   Outcome Measure  Help needed turning from your back to your side while in a flat bed without using bedrails?: None Help needed moving from lying on your back to sitting on the side of a flat bed without using bedrails?: A Little Help needed moving to and from a bed to a chair (including a wheelchair)?: A Little Help needed standing up from a chair using your arms (e.g., wheelchair or bedside chair)?: A Little Help needed to walk in hospital room?: A Little Help needed climbing 3-5 steps with a railing? : A Little 6 Click Score: 19    End of Session Equipment Utilized During Treatment: Gait belt Activity Tolerance: Patient tolerated treatment well Patient left: in bed;with call bell/phone within reach Nurse Communication: Mobility status PT Visit Diagnosis: Other abnormalities of gait and mobility (R26.89);Difficulty in walking, not elsewhere classified (R26.2);Muscle weakness (generalized) (M62.81)     Time: 4098-1191 PT Time Calculation (min) (ACUTE ONLY): 22 min  Charges:  $Therapeutic Exercise: 8-22 mins $Therapeutic Activity: 8-22 mins                     Basilia Jumbo PT, DPT 06/02/21 11:37 AM [phone]

## 2021-06-02 NOTE — Progress Notes (Signed)
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 Original TKA in 2008. Spacer placed in 2022.    PT Comments    Pt received sitting EOB, RN in room administering meds. Pt agreeable to therapy. He performs all mobility with SUP. He increased ambulation distance to 183ft, cadence decreased with decreased WB through L:LE. He reports 3-4/10 pain in L knee during ambulation. Pt reports (with RN present) that he has been walking to the restroom by himself in his room. Pt ended session sitting EOB, bed alarm off due to pt being Mod I in room. Deficits remain in strength, endurance and ambulatory function. Plan to assess stairs next visit. Would benefit from skilled PT to address above deficits and promote optimal return to PLOF.   Recommendations for follow up therapy are one component of a multi-disciplinary discharge planning process, led by the attending physician.  Recommendations may be updated based on patient status, additional functional criteria and insurance authorization.  Follow Up Recommendations  Home health PT     Assistance Recommended at Discharge Intermittent Supervision/Assistance  Patient can return home with the following A little help with walking and/or transfers;A little help with bathing/dressing/bathroom;Help with stairs or ramp for entrance   Equipment Recommendations  None recommended by PT    Recommendations for Other Services       Precautions / Restrictions Restrictions Weight Bearing Restrictions: Yes LLE Weight Bearing: Weight bearing as tolerated     Mobility  Bed Mobility               General bed mobility comments: Not observed - pt EOB at start and end of session    Transfers Overall transfer level: Needs assistance Equipment used: Rolling  walker (2 wheels) Transfers: Sit to/from Stand Sit to Stand: Supervision                Ambulation/Gait Ambulation/Gait assistance: Supervision Gait Distance (Feet): 130 Feet Assistive device: Rolling walker (2 wheels) Gait Pattern/deviations: Step-through pattern;Decreased weight shift to left;Decreased stride length       General Gait Details: 137ft with decreased cadence.   Stairs             Wheelchair Mobility    Modified Rankin (Stroke Patients Only)       Balance Overall balance assessment: Needs assistance   Sitting balance-Leahy Scale: Good       Standing balance-Leahy Scale: Fair Standing balance comment: Ambulates using RW however not reliant on AD during static standing                            Cognition Arousal/Alertness: Awake/alert Behavior During Therapy: WFL for tasks assessed/performed Overall Cognitive Status: Within Functional Limits for tasks assessed                                          Exercises      General Comments        Pertinent Vitals/Pain Pain Assessment: 0-10 Pain Score: 4  Pain Location: L knee Pain Descriptors / Indicators: Aching;Dull Pain Intervention(s): Limited activity within patient's tolerance;Monitored during session;Premedicated before session;Ice applied    Home Living  Prior Function            PT Goals (current goals can now be found in the care plan section) Acute Rehab PT Goals Patient Stated Goal: to walk without pain PT Goal Formulation: With patient Time For Goal Achievement: 06/14/21 Potential to Achieve Goals: Good    Frequency    BID      PT Plan      Co-evaluation              AM-PAC PT "6 Clicks" Mobility   Outcome Measure  Help needed turning from your back to your side while in a flat bed without using bedrails?: None Help needed moving from lying on your back to sitting on the side of a  flat bed without using bedrails?: A Little Help needed moving to and from a bed to a chair (including a wheelchair)?: A Little Help needed standing up from a chair using your arms (e.g., wheelchair or bedside chair)?: A Little Help needed to walk in hospital room?: A Little Help needed climbing 3-5 steps with a railing? : A Little 6 Click Score: 19    End of Session   Activity Tolerance: Patient tolerated treatment well Patient left: in bed;with call bell/phone within reach Nurse Communication: Mobility status PT Visit Diagnosis: Other abnormalities of gait and mobility (R26.89);Difficulty in walking, not elsewhere classified (R26.2);Muscle weakness (generalized) (M62.81)     Time: 6160-7371 PT Time Calculation (min) (ACUTE ONLY): 11 min  Charges:  $Therapeutic Exercise: 8-22 mins                     Basilia Jumbo PT, DPT 06/02/21 10:05 AM [phone]

## 2021-06-03 LAB — GLUCOSE, CAPILLARY: Glucose-Capillary: 116 mg/dL — ABNORMAL HIGH (ref 70–99)

## 2021-06-03 MED ORDER — DOCUSATE SODIUM 100 MG PO CAPS: 100.0000 mg | ORAL_CAPSULE | Freq: Two times a day (BID) | ORAL | 0 refills | Status: DC

## 2021-06-03 MED ORDER — HYDROCODONE-ACETAMINOPHEN 5-325 MG PO TABS: 1.0000 | ORAL_TABLET | ORAL | 0 refills | Status: DC | PRN

## 2021-06-03 NOTE — Plan of Care (Signed)
°  Problem: Health Behavior/Discharge Planning: Goal: Ability to manage health-related needs will improve Outcome: Adequate for Discharge   Problem: Clinical Measurements: Goal: Ability to maintain clinical measurements within normal limits will improve Outcome: Adequate for Discharge   Problem: Clinical Measurements: Goal: Will remain free from infection Outcome: Adequate for Discharge   Problem: Clinical Measurements: Goal: Respiratory complications will improve Outcome: Adequate for Discharge   Problem: Activity: Goal: Risk for activity intolerance will decrease Outcome: Adequate for Discharge   Problem: Elimination: Goal: Will not experience complications related to bowel motility Outcome: Adequate for Discharge   Problem: Elimination: Goal: Will not experience complications related to urinary retention Outcome: Adequate for Discharge

## 2021-06-03 NOTE — Plan of Care (Signed)
°  Problem: Health Behavior/Discharge Planning: Goal: Ability to manage health-related needs will improve 06/03/2021 1055 by Derrek Monaco, RN Outcome: Adequate for Discharge 06/03/2021 1050 by Derrek Monaco, RN Outcome: Adequate for Discharge 06/03/2021 1049 by Derrek Monaco, RN Outcome: Adequate for Discharge   Problem: Clinical Measurements: Goal: Ability to maintain clinical measurements within normal limits will improve 06/03/2021 1055 by Derrek Monaco, RN Outcome: Adequate for Discharge 06/03/2021 1050 by Derrek Monaco, RN Outcome: Adequate for Discharge 06/03/2021 1049 by Derrek Monaco, RN Outcome: Adequate for Discharge   Problem: Clinical Measurements: Goal: Will remain free from infection 06/03/2021 1055 by Derrek Monaco, RN Outcome: Adequate for Discharge 06/03/2021 1050 by Derrek Monaco, RN Outcome: Adequate for Discharge 06/03/2021 1049 by Derrek Monaco, RN Outcome: Adequate for Discharge   Problem: Clinical Measurements: Goal: Respiratory complications will improve 06/03/2021 1055 by Derrek Monaco, RN Outcome: Adequate for Discharge 06/03/2021 1050 by Derrek Monaco, RN Outcome: Adequate for Discharge 06/03/2021 1049 by Derrek Monaco, RN Outcome: Adequate for Discharge   Problem: Activity: Goal: Risk for activity intolerance will decrease 06/03/2021 1055 by Derrek Monaco, RN Outcome: Adequate for Discharge 06/03/2021 1050 by Derrek Monaco, RN Outcome: Adequate for Discharge 06/03/2021 1049 by Derrek Monaco, RN Outcome: Adequate for Discharge

## 2021-06-03 NOTE — Care Management Important Message (Signed)
Important Message  Patient Details  Name: Randall Vincent MRN: [id number] Date of Birth: 02-16-1941   Medicare Important Message Given:  Yes     Olegario Messier A Barron Vanloan 06/03/2021, 10:16 AM

## 2021-06-03 NOTE — Plan of Care (Signed)
°  Problem: Health Behavior/Discharge Planning: Goal: Ability to manage health-related needs will improve Outcome: Progressing   Problem: Clinical Measurements: Goal: Ability to maintain clinical measurements within normal limits will improve Outcome: Progressing Goal: Will remain free from infection Outcome: Progressing Goal: Respiratory complications will improve Outcome: Progressing   Problem: Activity: Goal: Risk for activity intolerance will decrease Outcome: Progressing   Problem: Elimination: Goal: Will not experience complications related to bowel motility Outcome: Progressing Goal: Will not experience complications related to urinary retention Outcome: Progressing   Problem: Pain Managment: Goal: General experience of comfort will improve Outcome: Progressing   Problem: Safety: Goal: Ability to remain free from injury will improve Outcome: Progressing   Problem: Skin Integrity: Goal: Risk for impaired skin integrity will decrease Outcome: Progressing   Problem: Activity: Goal: Ability to avoid complications of mobility impairment will improve Outcome: Progressing Goal: Range of joint motion will improve Outcome: Progressing   Problem: Clinical Measurements: Goal: Postoperative complications will be avoided or minimized Outcome: Progressing   Problem: Pain Management: Goal: Pain level will decrease with appropriate interventions Outcome: Progressing   Problem: Skin Integrity: Goal: Will show signs of wound healing Outcome: Progressing   Problem: Fluid Volume: Goal: Ability to maintain a balanced intake and output will improve Outcome: Progressing   Problem: Metabolic: Goal: Ability to maintain appropriate glucose levels will improve Outcome: Progressing

## 2021-06-03 NOTE — Discharge Instructions (Signed)
Continue weight bear as tolerated on the left lower extremity.    Elevate the left lower extremity whenever possible and continue the polar care while elevating the extremity. Patient may shower. No bath or submerging the wound.    Take pradaxa as directed for blood clot prevention.  Continue to work on knee range of motion exercises at home as instructed by physical therapy. Continue to use a walker for assistance with ambulation until cleared by physical therapy.  Call [phone] with any questions, such as fever > 101.5 degrees, drainage from the wound or shortness of breath.

## 2021-06-03 NOTE — Progress Notes (Signed)
°  Subjective:  Patient reports pain as mild to moderate.    Objective:   VITALS:   Vitals:   06/02/21 1214 06/02/21 1745 06/02/21 2028 06/03/21 0610  BP: 121/60 111/63 129/74 121/60  Pulse: 69 60 68 (!) 59  Resp: 16 17 20 20   Temp: 97.7 F (36.5 C) 98.3 F (36.8 C) 98.1 F (36.7 C) 97.7 F (36.5 C)  TempSrc:  Oral Oral   SpO2: 96% 98% 98% 99%  Weight:      Height:        PHYSICAL EXAM:  Neurologically intact ABD soft Neurovascular intact Sensation intact distally Intact pulses distally Dorsiflexion/Plantar flexion intact Incision: dressing C/D/I No cellulitis present Compartment soft  LABS  Results for orders placed or performed during the hospital encounter of 05/31/21 (from the past 24 hour(s))  Glucose, capillary     Status: Abnormal   Collection Time: 06/02/21  8:29 AM  Result Value Ref Range   Glucose-Capillary 126 (H) 70 - 99 mg/dL  Glucose, capillary     Status: Abnormal   Collection Time: 06/02/21 12:17 PM  Result Value Ref Range   Glucose-Capillary 115 (H) 70 - 99 mg/dL  Glucose, capillary     Status: Abnormal   Collection Time: 06/02/21  4:53 PM  Result Value Ref Range   Glucose-Capillary 137 (H) 70 - 99 mg/dL  Glucose, capillary     Status: Abnormal   Collection Time: 06/02/21  9:55 PM  Result Value Ref Range   Glucose-Capillary 149 (H) 70 - 99 mg/dL    No results found.  Assessment/Plan: 3 Days Post-Op   Principal Problem:   Status post revision of total knee replacement, left   Up with therapy Discharge home today with HHPT   07/31/21 , PA-C 06/03/2021, 6:53 AM

## 2021-06-03 NOTE — Progress Notes (Signed)
Discharge instructions reviewed with patient and son. Both voiced understanding of instructions. Son will be transporting pt home

## 2021-06-03 NOTE — Progress Notes (Signed)
Physical Therapy Treatment Patient Details Name: Randall Vincent MRN: [id number] DOB: [birth year] Today's Date: 06/03/2021   History of Present Illness 81 y/o male s/p left toal knee revision due to painful left total knee with spacer for prior infection. Original TKA in 2008. Spacer placed in 2022.    PT Comments    Pt received sitting EOB receiving meds from RN. Agreeable to PT. Denies need to review HEP stating has good understanding of exercises. Pt did require set up assist for donning shoes, and minA for donning L shoe. Pt able to stand to RW, amb, and asc/desc stairs with supervision and safe use of RW and sequencing on stairs with L hand rail use. Progressed overall ambulation to 250' with step through pattern and consistent heel strike on LLE during gait. Pt denies any questions or concerns returning to home environment. Pt returned to sitting Eob with all needs in place.  D/c recs remain appropriate.    Recommendations for follow up therapy are one component of a multi-disciplinary discharge planning process, led by the attending physician.  Recommendations may be updated based on patient status, additional functional criteria and insurance authorization.  Follow Up Recommendations  Home health PT     Assistance Recommended at Discharge Intermittent Supervision/Assistance  Patient can return home with the following A little help with walking and/or transfers;A little help with bathing/dressing/bathroom;Help with stairs or ramp for entrance   Equipment Recommendations  None recommended by PT    Recommendations for Other Services       Precautions / Restrictions Precautions Precautions: Fall Restrictions Weight Bearing Restrictions: Yes LLE Weight Bearing: Weight bearing as tolerated     Mobility  Bed Mobility Overal bed mobility: Modified Independent               Patient Response: Cooperative  Transfers Overall transfer level: Needs assistance Equipment used:  Rolling walker (2 wheels) Transfers: Sit to/from Stand Sit to Stand: Supervision           General transfer comment: increased time to attain standing    Ambulation/Gait Ambulation/Gait assistance: Supervision Gait Distance (Feet): 250 Feet Assistive device: Rolling walker (2 wheels) Gait Pattern/deviations: Step-through pattern;Decreased weight shift to left;Decreased stride length           Stairs Stairs: Yes Stairs assistance: Supervision Stair Management: One rail Left;Forwards;Sideways Number of Stairs: 4 General stair comments: ABle to asc forwards with BUE support on L hand rail and desc laterally with BUE support   Wheelchair Mobility    Modified Rankin (Stroke Patients Only)       Balance Overall balance assessment: Needs assistance Sitting-balance support: Feet supported Sitting balance-Leahy Scale: Good     Standing balance support: During functional activity Standing balance-Leahy Scale: Fair Standing balance comment: Ambulates using RW however not reliant on AD during static standing                            Cognition Arousal/Alertness: Awake/alert   Overall Cognitive Status: Within Functional Limits for tasks assessed                                          Exercises      General Comments        Pertinent Vitals/Pain Pain Assessment: 0-10 Pain Score: 4  Pain Location: L knee Pain Descriptors / Indicators: Aching;Dull Pain  Intervention(s): Limited activity within patient's tolerance;Monitored during session;Premedicated before session;Repositioned    Home Living                          Prior Function            PT Goals (current goals can now be found in the care plan section) Acute Rehab PT Goals Patient Stated Goal: to walk without pain PT Goal Formulation: With patient Time For Goal Achievement: 06/14/21 Potential to Achieve Goals: Good Progress towards PT goals: Progressing  toward goals    Frequency    BID      PT Plan Current plan remains appropriate    Co-evaluation              AM-PAC PT "6 Clicks" Mobility   Outcome Measure  Help needed turning from your back to your side while in a flat bed without using bedrails?: None Help needed moving from lying on your back to sitting on the side of a flat bed without using bedrails?: A Little Help needed moving to and from a bed to a chair (including a wheelchair)?: A Little Help needed standing up from a chair using your arms (e.g., wheelchair or bedside chair)?: A Little Help needed to walk in hospital room?: A Little Help needed climbing 3-5 steps with a railing? : A Little 6 Click Score: 19    End of Session Equipment Utilized During Treatment: Gait belt Activity Tolerance: Patient tolerated treatment well Patient left: in bed;with call bell/phone within reach Nurse Communication: Mobility status PT Visit Diagnosis: Other abnormalities of gait and mobility (R26.89);Difficulty in walking, not elsewhere classified (R26.2);Muscle weakness (generalized) (M62.81)     Time: 8101-7510 PT Time Calculation (min) (ACUTE ONLY): 16 min  Charges:  $Gait Training: 8-22 mins                     Delphia Grates. Fairly IV, PT, DPT Physical Therapist- [place]  [facility]  06/03/2021, 1:31 PM

## 2021-06-03 NOTE — Plan of Care (Signed)
°  Problem: Health Behavior/Discharge Planning: Goal: Ability to manage health-related needs will improve 06/03/2021 0459 by Ulice Bold, RN Outcome: Progressing 06/03/2021 0458 by Ulice Bold, RN Outcome: Progressing   Problem: Clinical Measurements: Goal: Ability to maintain clinical measurements within normal limits will improve 06/03/2021 0459 by Ulice Bold, RN Outcome: Progressing 06/03/2021 0458 by Ulice Bold, RN Outcome: Progressing Goal: Will remain free from infection 06/03/2021 0459 by Ulice Bold, RN Outcome: Progressing 06/03/2021 0458 by Ulice Bold, RN Outcome: Progressing Goal: Respiratory complications will improve 06/03/2021 0459 by Ulice Bold, RN Outcome: Progressing 06/03/2021 0458 by Ulice Bold, RN Outcome: Progressing   Problem: Activity: Goal: Risk for activity intolerance will decrease 06/03/2021 0459 by Ulice Bold, RN Outcome: Progressing 06/03/2021 0458 by Ulice Bold, RN Outcome: Progressing   Problem: Elimination: Goal: Will not experience complications related to bowel motility 06/03/2021 0459 by Ulice Bold, RN Outcome: Progressing 06/03/2021 0458 by Ulice Bold, RN Outcome: Progressing Goal: Will not experience complications related to urinary retention 06/03/2021 0459 by Ulice Bold, RN Outcome: Progressing 06/03/2021 0458 by Ulice Bold, RN Outcome: Progressing   Problem: Pain Managment: Goal: General experience of comfort will improve 06/03/2021 0459 by Ulice Bold, RN Outcome: Progressing 06/03/2021 0458 by Ulice Bold, RN Outcome: Progressing   Problem: Safety: Goal: Ability to remain free from injury will improve 06/03/2021 0459 by Ulice Bold, RN Outcome: Progressing 06/03/2021 0458 by Ulice Bold, RN Outcome: Progressing   Problem: Skin Integrity: Goal: Risk for impaired skin integrity will decrease 06/03/2021 0459 by Ulice Bold, RN Outcome: Progressing 06/03/2021 0458 by  Ulice Bold, RN Outcome: Progressing   Problem: Activity: Goal: Ability to avoid complications of mobility impairment will improve 06/03/2021 0459 by Ulice Bold, RN Outcome: Progressing 06/03/2021 0458 by Ulice Bold, RN Outcome: Progressing Goal: Range of joint motion will improve 06/03/2021 0459 by Ulice Bold, RN Outcome: Progressing 06/03/2021 0458 by Ulice Bold, RN Outcome: Progressing   Problem: Clinical Measurements: Goal: Postoperative complications will be avoided or minimized 06/03/2021 0459 by Ulice Bold, RN Outcome: Progressing 06/03/2021 0458 by Ulice Bold, RN Outcome: Progressing   Problem: Pain Management: Goal: Pain level will decrease with appropriate interventions 06/03/2021 0459 by Ulice Bold, RN Outcome: Progressing 06/03/2021 0458 by Ulice Bold, RN Outcome: Progressing   Problem: Skin Integrity: Goal: Will show signs of wound healing 06/03/2021 0459 by Ulice Bold, RN Outcome: Progressing 06/03/2021 0458 by Ulice Bold, RN Outcome: Progressing   Problem: Fluid Volume: Goal: Ability to maintain a balanced intake and output will improve 06/03/2021 0459 by Ulice Bold, RN Outcome: Progressing 06/03/2021 0458 by Ulice Bold, RN Outcome: Progressing   Problem: Metabolic: Goal: Ability to maintain appropriate glucose levels will improve 06/03/2021 0459 by Ulice Bold, RN Outcome: Progressing 06/03/2021 0458 by Ulice Bold, RN Outcome: Progressing

## 2021-06-03 NOTE — Discharge Summary (Signed)
Physician Discharge Summary  Patient ID: Randall Vincent MRN: [id number] DOB/AGE: 1940-12-28 81 y.o.  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 b.) Rate/rhythm maintained on amiodarone + diltiazem + carvedilol; chronically anticoagulated using dabigatran. c.) DCCV 07/10/2015, 08/08/2019, 10/22/2019.   Arthritis    CHF (congestive heart failure) (HCC)    CKD (chronic kidney disease), stage III ([place])    Coronary artery disease 1991   a.) 2v CABG in 1991. b.) PCI 06/07/2006 --> Vision stents to native LCx and RCA   Hyperlipidemia    Hypertension    Long term current use of anticoagulant    a.) dabigatran   Myocardial infarction ([place]) 1991   a.) LHC --> 50% and 90% LAD lesions; referred to CVTS. 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    He was given perioperative antibiotics:  Anti-infectives (From admission, onward)    Start     Dose/Rate Route Frequency Ordered Stop   05/31/21 1400  ceFAZolin (ANCEF) IVPB 2g/100 mL premix        2 g 200 mL/hr over 30 Minutes Intravenous Every  6 hours 05/31/21 1052 05/31/21 2115   05/31/21 0642  ceFAZolin (ANCEF) 2-4 GM/100ML-% IVPB       Note to Pharmacy: Lyman Bishop T: cabinet override      05/31/21 0642 05/31/21 0809   05/31/21 0615  ceFAZolin (ANCEF) IVPB 2g/100 mL premix        2 g 200 mL/hr over 30 Minutes Intravenous On call to O.R. 05/31/21 [id number] 05/31/21 [id number]     .  He was given sequential compression devices, early ambulation, and Pradaxa for DVT prophylaxis.  He benefited maximally from the hospital stay and there were no complications.    Recent vital signs:  Vitals:   06/02/21 2028 06/03/21 0610  BP: 129/74 121/60  Pulse: 68 (!) 59  Resp: 20 20  Temp: 98.1 F (36.7 C) 97.7 F (36.5 C)  SpO2: 98% 99%    Recent laboratory studies:  Lab Results  Component Value Date   HGB 9.0 (L) 06/02/2021   HGB 8.9 (L) 06/01/2021   HGB 10.5 (L) 05/20/2021   Lab Results  Component Value Date   WBC 7.5 06/02/2021   PLT 161 06/02/2021   Lab Results  Component Value Date   INR 1.5 (H) 05/20/2021   Lab Results  Component Value Date   NA 135 06/01/2021   K 4.5 06/01/2021   CL 101 06/01/2021   CO2 27 06/01/2021   BUN 38 (H) 06/01/2021   CREATININE 2.25 (H) 06/01/2021  GLUCOSE 167 (H) 06/01/2021    Discharge Medications:   Allergies as of 06/03/2021   No Known Allergies      Medication List     TAKE these medications    amiodarone 200 MG tablet Commonly known as: PACERONE Take 200 mg by mouth daily.   atorvastatin 40 MG tablet Commonly known as: LIPITOR Take 40 mg by mouth every evening.   carvedilol 3.125 MG tablet Commonly known as: COREG Take 1 tablet (3.125 mg total) by mouth 2 (two) times daily with a meal.   dabigatran 75 MG Caps capsule Commonly known as: PRADAXA Take 75 mg by mouth 2 (two) times daily.   diltiazem 120 MG 24 hr capsule Commonly known as: CARDIZEM CD Take 1 capsule (120 mg total) by mouth daily.   diltiazem 240 MG 24 hr capsule Commonly known as:  TIAZAC Take 240 mg by mouth daily.   docusate sodium 100 MG capsule Commonly known as: COLACE Take 1 capsule (100 mg total) by mouth 2 (two) times daily.   ferrous sulfate 325 (65 FE) MG tablet Take 325 mg by mouth daily with breakfast.   HYDROcodone-acetaminophen 5-325 MG tablet Commonly known as: NORCO/VICODIN Take 1 tablet by mouth every 4 (four) hours as needed for severe pain.   metFORMIN 500 MG 24 hr tablet Commonly known as: GLUCOPHAGE-XR Take 500 mg by mouth 2 (two) times daily.   mupirocin ointment 2 % Commonly known as: BACTROBAN Apply 1 application topically daily as needed (leg sores).   ramipril 10 MG capsule Commonly known as: ALTACE Take 1 capsule (10 mg total) by mouth daily.   Semaglutide (1 MG/DOSE) 2 MG/1.5ML Sopn Inject 1 mg into the skin every Thursday.   torsemide 20 MG tablet Commonly known as: DEMADEX Take 1 tablet (20 mg total) by mouth 2 (two) times daily. What changed:  how much to take when to take this additional instructions   traMADol 50 MG tablet Commonly known as: ULTRAM Take 1 tablet (50 mg total) by mouth every 8 (eight) hours as needed for moderate pain or severe pain. What changed: when to take this   vitamin B-12 1000 MCG tablet Commonly known as: CYANOCOBALAMIN Take 1,000 mcg by mouth daily.               Durable Medical Equipment  (From admission, onward)           Start     Ordered   06/03/21 0701  For home use only DME 3 n 1  Once        06/03/21 0700   06/03/21 0701  For home use only DME Walker rolling  Once       Question Answer Comment  Walker: With 5 Inch Wheels   Patient needs a walker to treat with the following condition S/P revision of total knee, left      06/03/21 0700            Diagnostic Studies: DG Knee Left Port  Result Date: 05/31/2021 CLINICAL DATA:  Postop LEFT knee surgery EXAM: PORTABLE LEFT KNEE - 1-2 VIEW COMPARISON:  Portable exam 1057 hours compared 01/11/2020 FINDINGS:  Interval revision of LEFT knee arthroplasty with long stemmed femoral and tibial components now identified. Diffuse osseous demineralization. No acute fracture, dislocation, or bone destruction. IMPRESSION: LEFT knee prosthesis without acute abnormalities. Electronically Signed   By: Lavonia Dana M.D.   On: 05/31/2021 11:20    Disposition: Discharge disposition: 01-Home or Self Care  Signed: Carlynn Spry ,PA-C 06/03/2021, 7:01 AM

## 2021-06-03 NOTE — Plan of Care (Signed)
°  Problem: Health Behavior/Discharge Planning: Goal: Ability to manage health-related needs will improve 06/03/2021 1050 by Leonie Douglas, RN Outcome: Adequate for Discharge 06/03/2021 1049 by Leonie Douglas, RN Outcome: Adequate for Discharge   Problem: Clinical Measurements: Goal: Ability to maintain clinical measurements within normal limits will improve 06/03/2021 1050 by Leonie Douglas, RN Outcome: Adequate for Discharge 06/03/2021 1049 by Leonie Douglas, RN Outcome: Adequate for Discharge   Problem: Clinical Measurements: Goal: Will remain free from infection 06/03/2021 1050 by Leonie Douglas, RN Outcome: Adequate for Discharge 06/03/2021 1049 by Leonie Douglas, RN Outcome: Adequate for Discharge

## 2021-06-23 NOTE — H&P (Signed)
Randall Vincent MRN:  [id number] DOB/SEX:  July 14, 1940/male  CHIEF COMPLAINT:  Painful left Knee  HISTORY: Patient is a 81 y.o. male presented with a history of pain in the left knee. Onset of symptoms was gradual starting several years ago with unchanged course since that time. Prior procedures on the knee include infection. Patient has been treated conservatively with over-the-counter NSAIDs and activity modification. Patient currently rates pain in the knee at 7 out of 10 with activity. There is no pain at night.  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   CHF (congestive heart failure) ([place]) 02/10/2020   CHF exacerbation ([place]) 02/03/2020   Acute exacerbation of CHF (congestive heart failure) ([place]) 02/02/2020   Pressure injury of skin 01/30/2020   Acute on chronic diastolic CHF (congestive heart failure) ([place]) 01/27/2020   Acute-on-chronic kidney injury ([place]) 01/27/2020   A-fib ([place])    Obesity, Class III, BMI 40-49.9 (morbid obesity) ([place])    Diverticulosis of colon without hemorrhage    Intestinal bypass or anastomosis status    Melena 01/10/2020   Symptomatic anemia 01/10/2020   Acute blood loss anemia 01/10/2020   Suspect Septic arthritis ([place]) 01/10/2020   Chronic anticoagulation 01/10/2020   History of bilateral knee replacement 01/10/2020   Chronic kidney disease, stage 3a ([place]) 01/10/2020   Acute on chronic congestive heart failure ([place]) 01/10/2020   Elevated troponin 01/10/2020   Hyperlipidemia associated with type 2 diabetes mellitus ([place]) 07/04/2019   Chest pain 06/26/2019   CAD, multiple vessel 06/26/2019   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/26/2019   Atrial fibrillation status post cardioversion 10/22/19 ([place]) 06/26/2019   Essential hypertension 06/26/2019   DM (diabetes mellitus), type 2 ([place]) 06/26/2019   Anginal equivalent ([place]) 06/26/2019   Drug-induced  constipation 11/02/2018   Low serum vitamin B12 11/02/2018   Venous stasis dermatitis of both lower extremities 11/17/2017   Ventral hernia without obstruction or gangrene 09/08/2016   Peripheral edema 04/22/2016   Hay fever 08/13/2015   Impacted cerumen of both ears 08/13/2015   Seasonal allergic rhinitis due to pollen 08/13/2015   Bilateral edema of lower extremity 08/05/2015   Localized edema 06/17/2015   DOE (dyspnea on exertion) 05/19/2015   Hyperlipidemia 05/19/2015   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 b.) Rate/rhythm maintained on amiodarone + diltiazem + carvedilol; chronically anticoagulated using dabigatran. c.) DCCV 07/10/2015, 08/08/2019, 10/22/2019.   Arthritis    CHF (congestive heart failure) (HCC)    CKD (chronic kidney disease), stage III ([place])    Coronary artery disease 1991   a.) 2v CABG in 1991. b.) PCI 06/07/2006 --> Vision stents to native LCx and RCA   Hyperlipidemia    Hypertension    Long term current use of anticoagulant    a.) dabigatran   Myocardial infarction ([place]) 1991   a.) LHC --> 50% and 90% LAD lesions; referred to CVTS. 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   a.) LIMA-LAD, SVG-RCA   T2DM (type 2 diabetes mellitus) ([place])    Past Surgical History:  Procedure Laterality Date   BACK SURGERY  1970   L5-L6 ruptured  disk   BLADDER SURGERY  2000   CARDIOVERSION N/A 07/10/2015   CARDIOVERSION N/A 08/08/2019   CARDIOVERSION N/A 10/22/2019   COLON SURGERY     COLONOSCOPY N/A 01/15/2020   Procedure: COLONOSCOPY;  Surgeon: Pasty Spillersahiliani, Varnita B, MD;  Location: [facility] ENDOSCOPY;  Service: Endoscopy;  Laterality: N/A;   COLONOSCOPY N/A 01/16/2020   Procedure: COLONOSCOPY;  Surgeon: Pasty Spillersahiliani, Varnita B, MD;  Location: [facility] ENDOSCOPY;   Service: Endoscopy;  Laterality: N/A;   CORONARY ARTERY BYPASS GRAFT N/A 01/02/1990   Procedure: 2v CORONARY ARTERY BYPASS GRAFT (LIMA-LAD, SVG-RCA)   ESOPHAGOGASTRODUODENOSCOPY N/A 01/13/2020   Procedure: ESOPHAGOGASTRODUODENOSCOPY (EGD);  Surgeon: Pasty Spillersahiliani, Varnita B, MD;  [facility];  Service: Endoscopy;  Laterality: N/A;   REPLACEMENT TOTAL KNEE BILATERAL Bilateral 2008   STENT PLACEMENT VASCULAR ([facility] HX)  2008   TOTAL KNEE REVISION Left 01/16/2020   Procedure: TOTAL KNEE REVISION;  Surgeon: Lyndle HerrlichBowers, James R, MD;  Location: [facility] ORS;  Service: Orthopedics;  Laterality: Left;   TOTAL KNEE REVISION Left 05/31/2021   Procedure: TOTAL KNEE REVISION;  Surgeon: Lyndle HerrlichBowers, James R, MD;  Location: [facility] ORS;  Service: Orthopedics;  Laterality: Left;     MEDICATIONS:   No medications prior to admission.    ALLERGIES:  No Known Allergies  REVIEW OF SYSTEMS:  Pertinent items are noted in HPI.   FAMILY HISTORY:  History reviewed. No pertinent family history.  SOCIAL HISTORY:   Social History   Tobacco Use   Smoking status: Former    Types: Cigarettes    Quit date: 1991    Years since quitting: 32.1   Smokeless tobacco: Never  Substance Use Topics   Alcohol use: Yes    Alcohol/week: 1.0 standard drink    Types: 1 Cans of beer per week    Comment: very rarely     EXAMINATION:  Vital signs in last 24 hours:    BP (!) 147/61    Pulse 61    Temp 98.3 F (36.8 C)    Resp 18    Ht 5\' 8"  (1.727 m)    Wt 99.2 kg    SpO2 100%    BMI 33.25 kg/m   General Appearance:    Alert, cooperative, no distress, appears stated age  Head:    Normocephalic, without obvious abnormality, atraumatic  Eyes:    PERRL, conjunctiva/corneas clear, EOM's intact, fundi    benign, both eyes       Ears:    Normal TM's and external ear canals, both ears  Nose:   Nares normal, septum midline, mucosa normal, no drainage    or sinus tenderness  Throat:   Lips, mucosa, and tongue normal; teeth and  gums normal  Neck:   Supple, symmetrical, trachea midline, no adenopathy;       thyroid:  No enlargement/tenderness/nodules; no carotid   bruit or JVD  Back:     Symmetric, no curvature, ROM normal, no CVA tenderness  Lungs:     Clear to auscultation bilaterally, respirations unlabored  Chest wall:    No tenderness or deformity  Heart:    Regular rate and rhythm, S1 and S2 normal, no murmur, rub   or gallop  Abdomen:     Soft, non-tender, bowel sounds active all four quadrants,    no masses, no organomegaly  Genitalia:    Normal male without lesion, discharge or tenderness  Rectal:    Normal tone, normal prostate, no masses or tenderness;   guaiac negative stool  Extremities:  Extremities normal, atraumatic, no cyanosis or edema  Pulses:   2+ and symmetric all extremities  Skin:   Skin color, texture, turgor normal, no rashes or lesions  Lymph nodes:   Cervical, supraclavicular, and axillary nodes normal  Neurologic:   CNII-XII intact. Normal strength, sensation and reflexes      throughout    Musculoskeletal:  ROM 0-90, Ligaments intact,  Imaging Review   Assessment/Plan: Left knee antibiotic spacer  Left total knee revision Carlynn Spry 06/23/2021, 7:40 AM

## 2021-06-24 IMAGING — DX DG CHEST 1V PORT
1 series · 2 of 2 positions shown · non-contrast
Comparison: Radiographs 01/10/2020 and 06/26/2019.

CLINICAL DATA: Shortness of breath. History of diabetes, atrial
fibrillation and hypertension.

EXAM:
PORTABLE CHEST 1 VIEW

[Series 1: chest ap · 0.14mm/px · 2 of 2 slices shown]
[im 1/2]
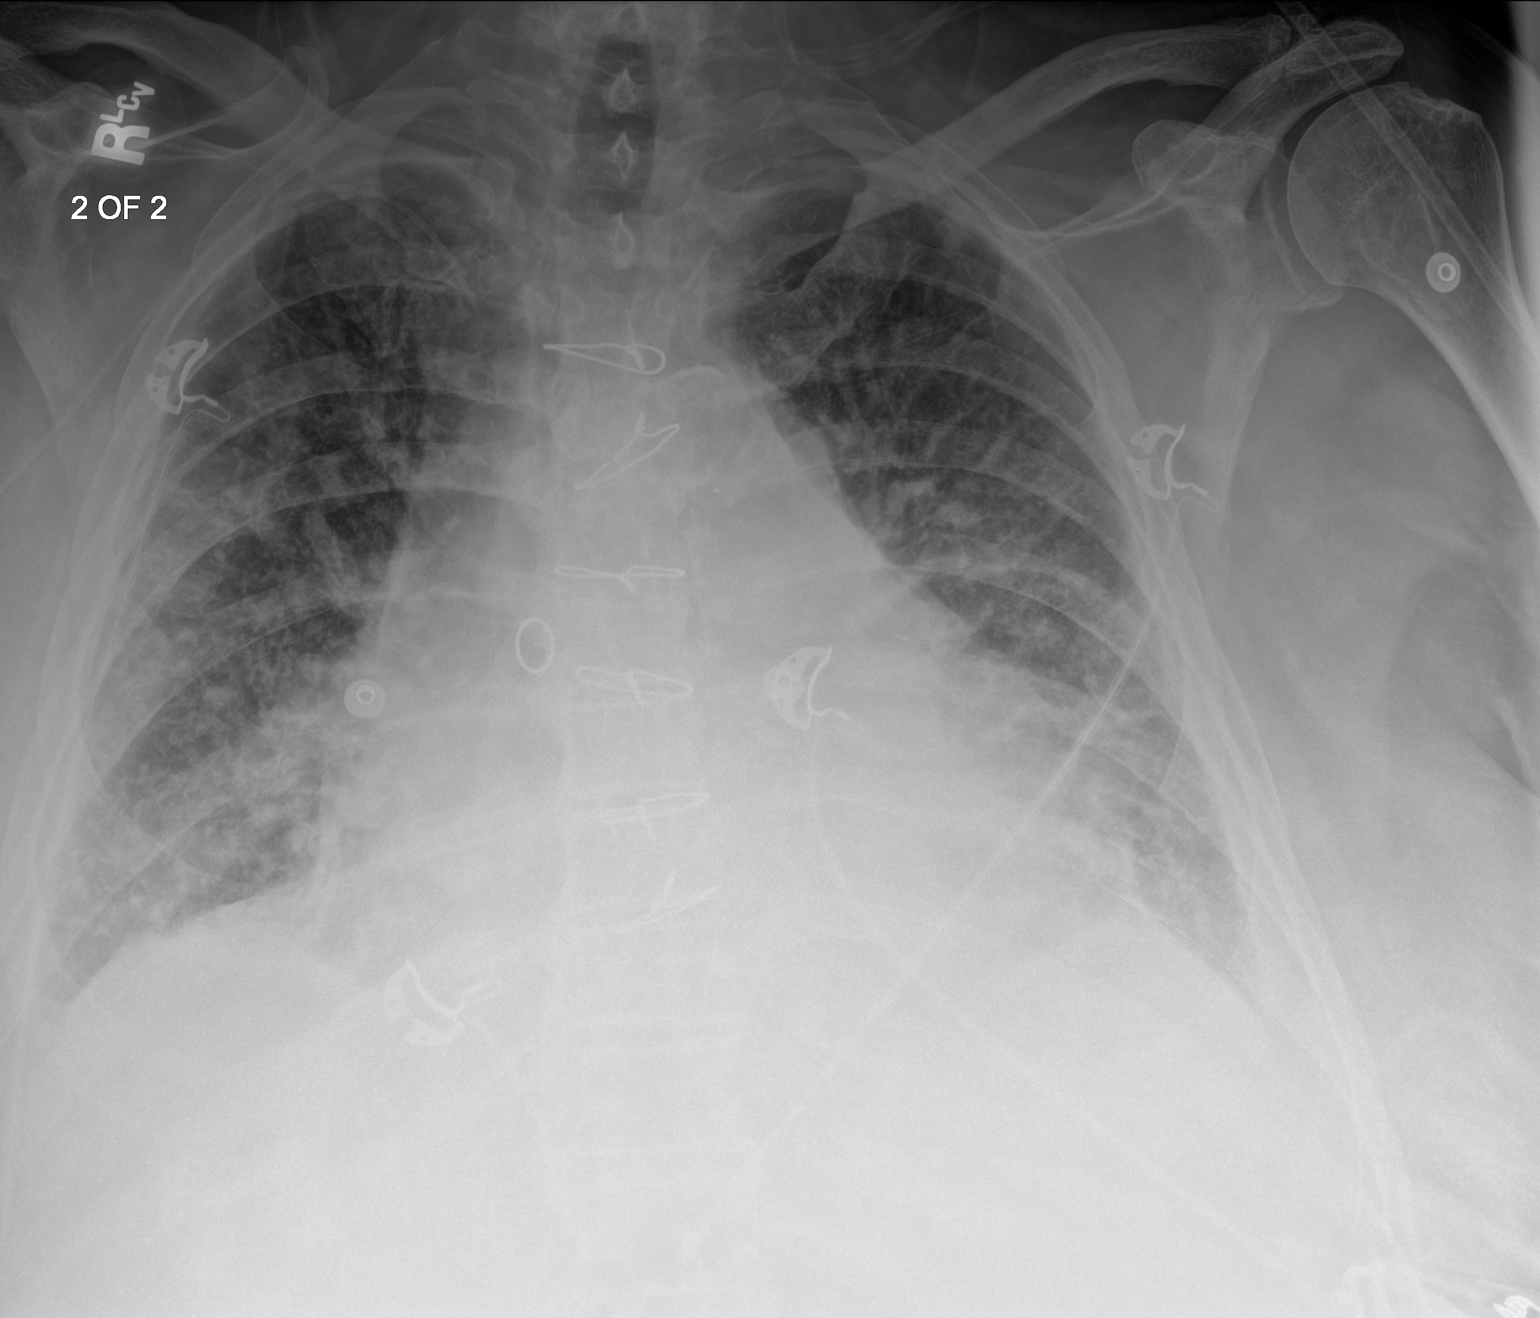
[im 2/2]
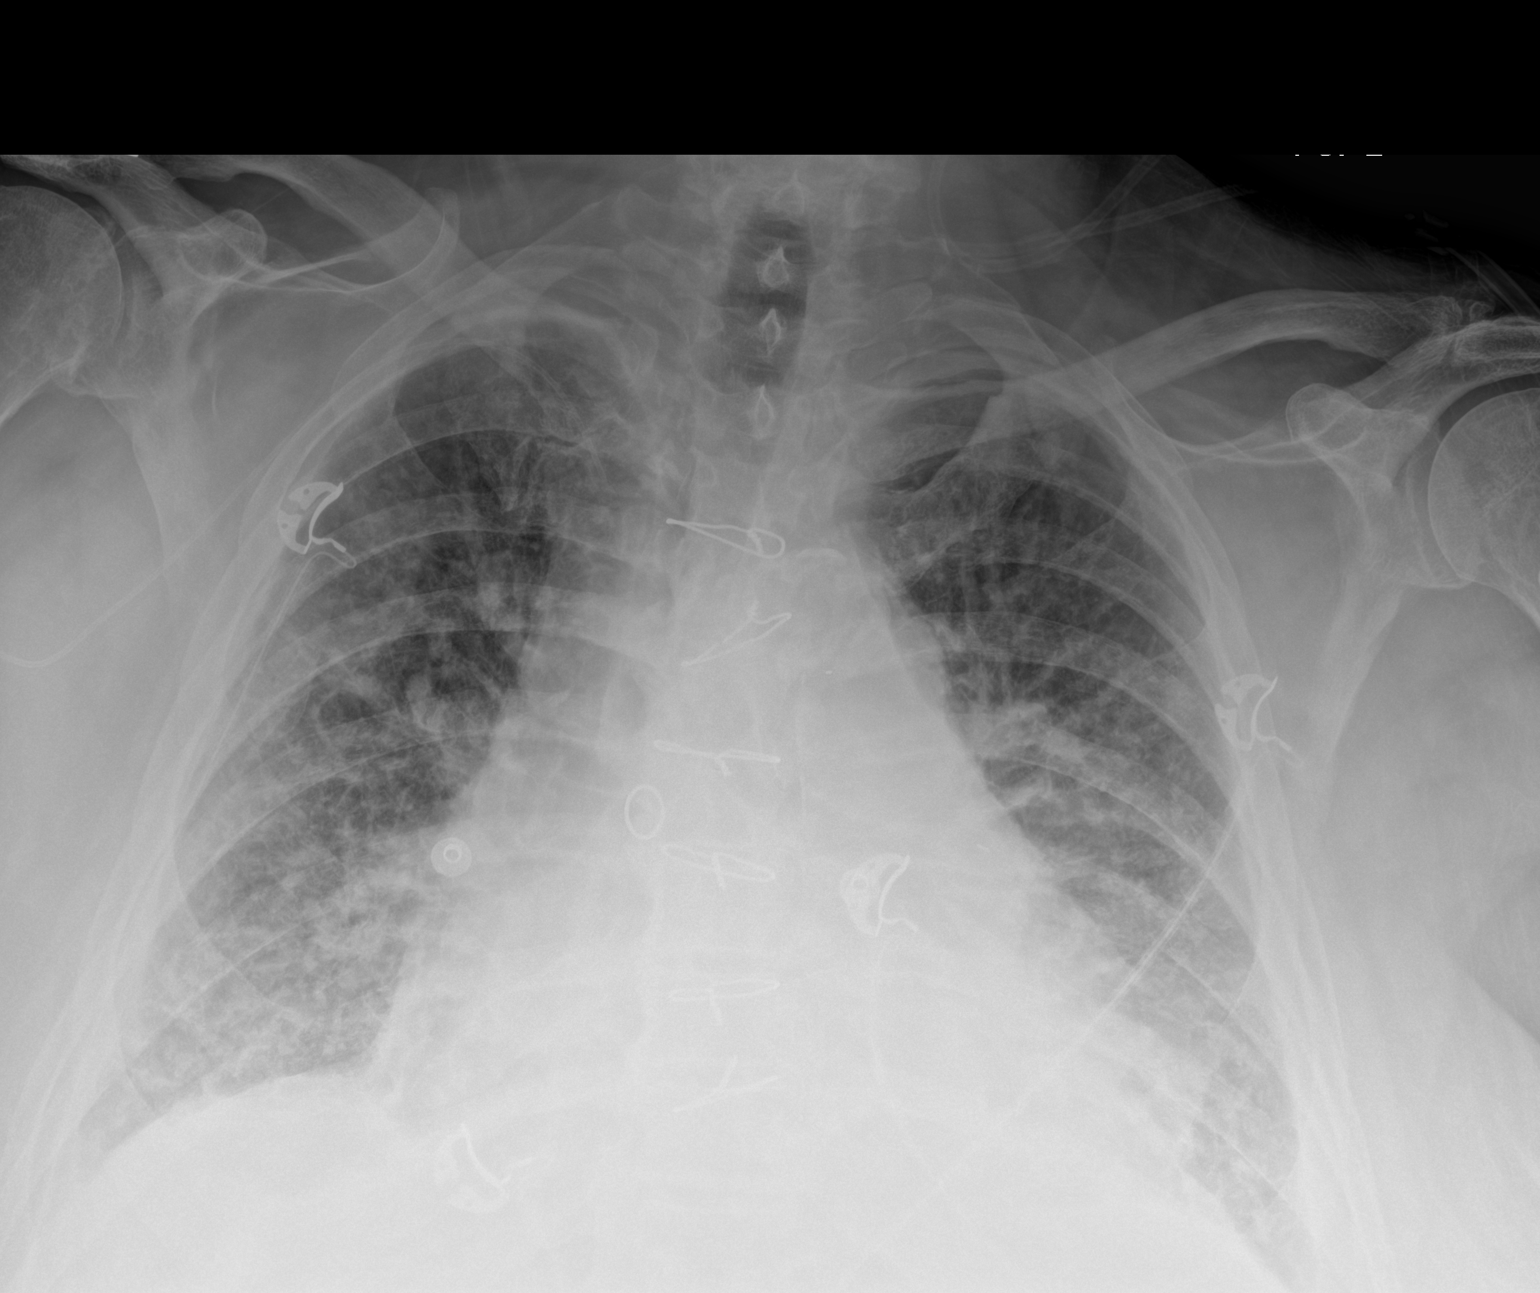

[2 of 2 positions shown; findings below may reference images not displayed]

FINDINGS: 9037 hours. Two views obtained. There is stable cardiac enlargement
post median sternotomy and CABG. There is aortic atherosclerosis.
There is new mild pulmonary edema and probable small bilateral
pleural effusions. No confluent airspace opacity or pneumothorax.
Apparent right arm PICC not visualized beyond the level of the right
sternoclavicular joint, likely in the right brachiocephalic vein. No
acute osseous findings.
IMPRESSION: 1. New mild pulmonary edema and probable small bilateral pleural
effusions consistent with congestive heart failure.
2. Apparent right arm PICC tip is likely in the right
brachiocephalic vein.

## 2021-08-27 DIAGNOSIS — M109 Gout, unspecified: Secondary | ICD-10-CM | POA: Insufficient documentation

## 2021-09-08 ENCOUNTER — Encounter: Payer: Medicare Other | Attending: Internal Medicine | Admitting: Internal Medicine

## 2021-09-08 DIAGNOSIS — I509 Heart failure, unspecified: Secondary | ICD-10-CM | POA: Diagnosis not present

## 2021-09-08 DIAGNOSIS — E1122 Type 2 diabetes mellitus with diabetic chronic kidney disease: Secondary | ICD-10-CM | POA: Diagnosis not present

## 2021-09-08 DIAGNOSIS — I2510 Atherosclerotic heart disease of native coronary artery without angina pectoris: Secondary | ICD-10-CM | POA: Diagnosis not present

## 2021-09-08 DIAGNOSIS — W19XXXA Unspecified fall, initial encounter: Secondary | ICD-10-CM | POA: Diagnosis not present

## 2021-09-08 DIAGNOSIS — I130 Hypertensive heart and chronic kidney disease with heart failure and stage 1 through stage 4 chronic kidney disease, or unspecified chronic kidney disease: Secondary | ICD-10-CM | POA: Diagnosis not present

## 2021-09-08 DIAGNOSIS — E11622 Type 2 diabetes mellitus with other skin ulcer: Secondary | ICD-10-CM | POA: Insufficient documentation

## 2021-09-08 DIAGNOSIS — L98492 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d: Secondary | ICD-10-CM | POA: Insufficient documentation

## 2021-09-08 DIAGNOSIS — S41101A Unspecified open wound of right upper arm, initial encounter: Secondary | ICD-10-CM | POA: Insufficient documentation

## 2021-09-08 DIAGNOSIS — Z951 Presence of aortocoronary bypass graft: Secondary | ICD-10-CM | POA: Diagnosis not present

## 2021-09-08 DIAGNOSIS — S40811A Abrasion of right upper arm, initial encounter: Secondary | ICD-10-CM | POA: Diagnosis not present

## 2021-09-08 DIAGNOSIS — N1831 Chronic kidney disease, stage 3a: Secondary | ICD-10-CM | POA: Diagnosis not present

## 2021-09-10 NOTE — Progress Notes (Signed)
EDY, BELT ([id number]) ?Visit Report for 09/08/2021 ?Allergy List Details ?Patient Name: Randall Vincent, Randall Vincent ?Date of Service: 09/08/2021 8:45 AM ?Medical Record Number: [id number] ?Patient Account Number: [id number] ?Date of Birth/Sex: June 13, 1940 (81 y.o. M) ?Treating RN: Yevonne Pax ?Primary Care Emmeline Winebarger: Patrina Levering Other Clinician: ?Referring Phyllip Claw: Referral, Self ?Treating Mercedes Fort/Extender: Geralyn Corwin ?Weeks in Treatment: 0 ?Allergies ?Active Allergies ?No Known Allergies ?Allergy Notes ?Electronic Signature(s) ?Signed: 09/10/2021 4:04:50 PM By: Yevonne Pax RN ?Entered By: Yevonne Pax on 09/08/2021 09:02:35 ?AMEER, SANDEN ([id number]) ?-------------------------------------------------------------------------------- ?Arrival Information Details ?Patient Name: Randall Vincent ?Date of Service: 09/08/2021 8:45 AM ?Medical Record Number: [id number] ?Patient Account Number: [id number] ?Date of Birth/Sex: [birth year] (81 y.o. M) ?Treating RN: Yevonne Pax ?Primary Care Kingsley Farace: Patrina Levering Other Clinician: ?Referring Malasia Torain: Referral, Self ?Treating Jazarah Capili/Extender: Geralyn Corwin ?Weeks in Treatment: 0 ?Visit Information ?Patient Arrived: Ambulatory ?Arrival Time: 08:49 ?Accompanied By: self ?Transfer Assistance: None ?Patient Identification Verified: Yes ?Secondary Verification Process Completed: Yes ?Patient Requires Transmission-Based Precautions: No ?Patient Has Alerts: No ?Electronic Signature(s) ?Signed: 09/10/2021 4:04:50 PM By: Yevonne Pax RN ?Entered By: Yevonne Pax on 09/08/2021 09:01:53 ?VASH, QUEZADA ([id number]) ?-------------------------------------------------------------------------------- ?Clinic Level of Care Assessment Details ?Patient Name: Randall Vincent ?Date of Service: 09/08/2021 8:45 AM ?Medical Record Number: [id number] ?Patient Account Number: [id number] ?Date of Birth/Sex: 03-23-1941 (81 y.o. M) ?Treating RN: Yevonne Pax ?Primary Care Terease Marcotte: Patrina Levering Other  Clinician: ?Referring Lenya Sterne: Referral, Self ?Treating Wanetta Funderburke/Extender: Geralyn Corwin ?Weeks in Treatment: 0 ?Clinic Level of Care Assessment Items ?TOOL 1 Quantity Score ?X - Use when EandM and Procedure is performed on INITIAL visit 1 0 ?ASSESSMENTS - Nursing Assessment / Reassessment ?X - General Physical Exam (combine w/ comprehensive assessment (listed just below) when performed on new ?1 20 ?pt. evals) ?X- 1 25 ?Comprehensive Assessment (HX, ROS, Risk Assessments, Wounds Hx, etc.) ?ASSESSMENTS - Wound and Skin Assessment / Reassessment ?[]  - Dermatologic / Skin Assessment (not related to wound area) 0 ?ASSESSMENTS - Ostomy and/or Continence Assessment and Care ?[]  - Incontinence Assessment and Management 0 ?[]  - 0 ?Ostomy Care Assessment and Management (repouching, etc.) ?PROCESS - Coordination of Care ?X - Simple Patient / Family Education for ongoing care 1 15 ?[]  - 0 ?Complex (extensive) Patient / Family Education for ongoing care ?X- 1 10 ?Staff obtains Consents, Records, Test Results / Process Orders ?[]  - 0 ?Staff telephones HHA, Nursing Homes / Clarify orders / etc ?[]  - 0 ?Routine Transfer to another Facility (non-emergent condition) ?[]  - 0 ?Routine Hospital Admission (non-emergent condition) ?X- 1 15 ?New Admissions / / Ordering NPWT, Apligraf, etc. ?[]  - 0 ?Emergency Hospital Admission (emergent condition) ?PROCESS - Special Needs ?[]  - Pediatric / Minor Patient Management 0 ?[]  - 0 ?Isolation Patient Management ?[]  - 0 ?Hearing / Language / Visual special needs ?[]  - 0 ?Assessment of Community assistance (transportation, D/C planning, etc.) ?[]  - 0 ?Additional assistance / Altered mentation ?[]  - 0 ?Support Surface(s) Assessment (bed, cushion, seat, etc.) ?INTERVENTIONS - Miscellaneous ?[]  - External ear exam 0 ?[]  - 0 ?Patient Transfer (multiple staff / / Similar devices) ?[]  - 0 ?Simple Staple / Suture removal (25 or less) ?[]  - 0 ?Complex Staple /  Suture removal (26 or more) ?[]  - 0 ?Hypo/Hyperglycemic Management (do not check if billed separately) ?[]  - 0 ?Ankle / Brachial Index (ABI) - do not check if billed separately ?Has the patient been seen at the hospital within the last three years: Yes ?Total Score: 85 ?Level Of Care: New/Established - Level ?3 ? (  [id number]) ?Electronic Signature(s) ?Signed: 09/10/2021 4:04:50 PM By: Yevonne Pax RN ?Entered By: Yevonne Pax on 09/08/2021 09:33:59 ?SHERREL, SHAFER ([id number]) ?-------------------------------------------------------------------------------- ?Encounter Discharge Information Details ?Patient Name: Randall Vincent ?Date of Service: 09/08/2021 8:45 AM ?Medical Record Number: [id number] ?Patient Account Number: [id number] ?Date of Birth/Sex: 1940-07-29 (81 y.o. M) ?Treating RN: Yevonne Pax ?Primary Care Damaris Geers: Patrina Levering Other Clinician: ?Referring Kimberley Speece: Referral, Self ?Treating Tymon Nemetz/Extender: Geralyn Corwin ?Weeks in Treatment: 0 ?Encounter Discharge Information Items Post Procedure Vitals ?Discharge Condition: Stable ?Temperature (?F): 98.3 ?Ambulatory Status: Gilmer Mor ?Pulse (bpm): 88 ?Discharge Destination: Home ?Respiratory Rate (breaths/min): 16 ?Transportation: Private Auto ?Blood Pressure (mmHg): 155/74 ?Accompanied By: self ?Schedule Follow-up Appointment: Yes ?Clinical Summary of Care: Patient Declined ?Electronic Signature(s) ?Signed: 09/10/2021 4:04:50 PM By: Yevonne Pax RN ?Entered By: Yevonne Pax on 09/08/2021 09:36:09 ?HAILEY, MILES ([id number]) ?-------------------------------------------------------------------------------- ?Lower Extremity Assessment Details ?Patient Name: Randall Vincent ?Date of Service: 09/08/2021 8:45 AM ?Medical Record Number: [id number] ?Patient Account Number: [id number] ?Date of Birth/Sex: 1941/01/22 (81 y.o. M) ?Treating RN: Yevonne Pax ?Primary Care Connor Meacham: Patrina Levering Other Clinician: ?Referring Cynithia Hakimi: Referral, Self ?Treating  Bj Morlock/Extender: Geralyn Corwin ?Weeks in Treatment: 0 ?Electronic Signature(s) ?Signed: 09/10/2021 4:04:50 PM By: Yevonne Pax RN ?Entered By: Yevonne Pax on 09/08/2021 09:04:00 ?DAWAUN, BRANCATO ([id number]) ?-------------------------------------------------------------------------------- ?Multi Wound Chart Details ?Patient Name: ASIA, FAVATA ?Date of Service: 09/08/2021 8:45 AM ?Medical Record Number: [id number] ?Patient Account Number: [id number] ?Date of Birth/Sex: 06-Dec-1940 (82 y.o. 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, Coronary Artery Disease, ?Hypertension, Type II Diabetes ?Date Acquired: 08/23/2021 N/A N/A ?Weeks of Treatment: 0 N/A N/A ?Wound Status: Open N/A N/A ?Wound Recurrence: No N/A N/A ?Measurements L x W x D (cm) 5x2x0.1 N/A N/A ?Area (cm?) : 7.854 N/A N/A ?Volume (cm?) : 0.785 N/A N/A ?Classification: Full Thickness Without Exposed N/A N/A ?Support Structures ?Exudate Amount: Medium N/A N/A ?Exudate Type: Serosanguineous N/A N/A ?Exudate Color: red, brown N/A N/A ?Granulation Amount: Medium (34-66%) N/A N/A ?Granulation Quality: Red N/A N/A ?Necrotic Amount: Medium (34-66%) N/A N/A ?Exposed Structures: ?Fascia: No N/A N/A ?Fat Layer (Subcutaneous Tissue): ?No ?Tendon: No ?Muscle: No ?Joint: No ?Bone: No ?Epithelialization: None N/A N/A ?Treatment Notes ?Electronic Signature(s) ?Signed: 09/10/2021 4:04:50 PM By: Yevonne Pax RN ?Entered By: Yevonne Pax on 09/08/2021 09:30:09 ?JAKARRI, LESKO ([id number]) ?-------------------------------------------------------------------------------- ?Multi-Disciplinary Care Plan Details ?Patient Name: KYEN, TAITE ?Date of Service: 09/08/2021 8:45 AM ?Medical Record Number: 0

## 2021-09-10 NOTE — Progress Notes (Signed)
NEELY, CECENA ([id number]) ?Visit Report for 09/08/2021 ?Abuse Risk Screen Details ?Patient Name: Randall Vincent, Randall Vincent ?Date of Service: 09/08/2021 8:45 AM ?Medical Record Number: [id number] ?Patient Account Number: [id number] ?Date of Birth/Sex: Sep 15, 1940 (81 y.o. M) ?Treating RN: Yevonne Pax ?Primary Care Jaeceon Michelin: Patrina Levering Other Clinician: ?Referring Ilynn Stauffer: Referral, Self ?Treating Orah Sonnen/Extender: Geralyn Corwin ?Weeks in Treatment: 0 ?Abuse Risk Screen Items ?Answer ?ABUSE RISK SCREEN: ?Has anyone close to you tried to hurt or harm you recentlyo No ?Do you feel uncomfortable with anyone in your familyo No ?Has anyone forced you do things that you didnot want to doo No ?Electronic Signature(s) ?Signed: 09/10/2021 4:04:50 PM By: Yevonne Pax RN ?Entered By: Yevonne Pax on 09/08/2021 09:02:54 ?MARVION, BASTIDAS ([id number]) ?-------------------------------------------------------------------------------- ?Activities of Daily Living Details ?Patient Name: Randall Vincent, Randall Vincent ?Date of Service: 09/08/2021 8:45 AM ?Medical Record Number: [id number] ?Patient Account Number: [id number] ?Date of Birth/Sex: March 20, 1941 (81 y.o. M) ?Treating RN: Yevonne Pax ?Primary Care Westin Knotts: Patrina Levering Other Clinician: ?Referring Zi Sek: Referral, Self ?Treating Deryl Ports/Extender: Geralyn Corwin ?Weeks in Treatment: 0 ?Activities of Daily Living Items ?Answer ?Activities of Daily Living (Please select one for each item) ?Drive Automobile Completely Able ?Take Medications Completely Able ?Use Telephone Completely Able ?Care for Appearance Completely Able ?Use Toilet Completely Able ?Bath / Shower Completely Able ?Dress Self Completely Able ?Feed Self Completely Able ?Walk Completely Able ?Get In / Out Bed Completely Able ?Housework Completely Able ?Prepare Meals Completely Able ?Handle Money Completely Able ?Shop for Self Completely Able ?Electronic Signature(s) ?Signed: 09/10/2021 4:04:50 PM By: Yevonne Pax RN ?Entered By: Yevonne Pax  on 09/08/2021 09:03:04 ?ESTEVAN, KERSH ([id number]) ?-------------------------------------------------------------------------------- ?Education Screening Details ?Patient Name: Randall Vincent, Randall Vincent ?Date of Service: 09/08/2021 8:45 AM ?Medical Record Number: [id number] ?Patient Account Number: [id number] ?Date of Birth/Sex: 1940/08/27 (81 y.o. M) ?Treating RN: Yevonne Pax ?Primary Care Cotina Freedman: Patrina Levering Other Clinician: ?Referring Teria Khachatryan: Referral, Self ?Treating Giomar Gusler/Extender: Geralyn Corwin ?Weeks in Treatment: 0 ?Primary Learner Assessed: Patient ?Learning Preferences/Education Level/Primary Language ?Learning Preference: Explanation ?Highest Education Level: High School ?Preferred Language: English ?Cognitive Barrier ?Language Barrier: No ?Translator Needed: No ?Memory Deficit: No ?Emotional Barrier: No ?Cultural/Religious Beliefs Affecting Medical Care: No ?Physical Barrier ?Impaired Vision: Yes Glasses ?Impaired Hearing: No ?Decreased Hand dexterity: No ?Knowledge/Comprehension ?Knowledge Level: Medium ?Comprehension Level: Medium ?Ability to understand written instructions: High ?Ability to understand verbal instructions: High ?Motivation ?Anxiety Level: Anxious ?Cooperation: Cooperative ?Education Importance: Acknowledges Need ?Interest in Health Problems: Asks Questions ?Perception: Coherent ?Willingness to Engage in Self-Management ?High ?Activities: ?Readiness to Engage in Self-Management ?High ?Activities: ?Electronic Signature(s) ?Signed: 09/10/2021 4:04:50 PM By: Yevonne Pax RN ?Entered By: Yevonne Pax on 09/08/2021 09:03:15 ?CJ, EDGELL ([id number]) ?-------------------------------------------------------------------------------- ?Fall Risk Assessment Details ?Patient Name: Randall Vincent, Randall Vincent ?Date of Service: 09/08/2021 8:45 AM ?Medical Record Number: [id number] ?Patient Account Number: [id number] ?Date of Birth/Sex: [birth year] (81 y.o. M) ?Treating RN: Yevonne Pax ?Primary Care Mitcheal Sweetin: Patrina Levering Other Clinician: ?Referring Deaire Mcwhirter: Referral, Self ?Treating Carrieanne Kleen/Extender: Geralyn Corwin ?Weeks in Treatment: 0 ?Fall Risk Assessment Items ?Have you had 2 or more falls in the last 12 monthso 0 Yes ?Have you had any fall that resulted in injury in the last 12 monthso 0 Yes ?FALLS RISK SCREEN ?History of falling - immediate or within 3 months 25 Yes ?Secondary diagnosis (Do you have 2 or more medical diagnoseso) 0 No ?Ambulatory aid ?None/bed rest/wheelchair/nurse 0 No ?Crutches/cane/walker 15 Yes ?Furniture 0 No ?Intravenous therapy Access/Saline/Heparin Lock 0 No ?Gait/Transferring ?Normal/ bed rest/ wheelchair 0 No ?Weak (short steps with or without shuffle, stooped but able to lift  head while walking, may ?0 No ?seek support from furniture) ?Impaired (short steps with shuffle, may have difficulty arising from chair, head down, impaired ?0 No ?balance) ?Mental Status ?Oriented to own ability 0 Yes ?Electronic Signature(s) ?Signed: 09/10/2021 4:04:50 PM By: Yevonne Pax RN ?Entered By: Yevonne Pax on 09/08/2021 09:03:23 ?Randall Vincent, Randall Vincent ([id number]) ?-------------------------------------------------------------------------------- ?Foot Assessment Details ?Patient Name: Randall Vincent, Randall Vincent ?Date of Service: 09/08/2021 8:45 AM ?Medical Record Number: [id number] ?Patient Account Number: [id number] ?Date of Birth/Sex: 05/17/1941 (81 y.o. M) ?Treating RN: Yevonne Pax ?Primary Care Darvin Dials: Patrina Levering Other Clinician: ?Referring Josian Lanese: Referral, Self ?Treating Saban Heinlen/Extender: Geralyn Corwin ?Weeks in Treatment: 0 ?Foot Assessment Items ?Site Locations ?+ = Sensation present, - = Sensation absent, C = Callus, U = Ulcer ?R = Redness, W = Warmth, M = Maceration, PU = Pre-ulcerative lesion ?F = Fissure, S = Swelling, D = Dryness ?Assessment ?Right: Left: ?Other Deformity: No No ?Prior Foot Ulcer: No No ?Prior Amputation: No No ?Charcot Joint: No No ?Ambulatory Status: Ambulatory Without Help ?Gait:  Steady ?Electronic Signature(s) ?Signed: 09/10/2021 4:04:50 PM By: Yevonne Pax RN ?Entered By: Yevonne Pax on 09/08/2021 09:03:37 ?Randall Vincent, Randall Vincent ([id number]) ?-------------------------------------------------------------------------------- ?Nutrition Risk Screening Details ?Patient Name: Randall Vincent, Randall Vincent ?Date of Service: 09/08/2021 8:45 AM ?Medical Record Number: [id number] ?Patient Account Number: [id number] ?Date of Birth/Sex: [birth year] (81 y.o. M) ?Treating RN: Yevonne Pax ?Primary Care Nyaira Hodgens: Patrina Levering Other Clinician: ?Referring Autum Benfer: Referral, Self ?Treating Adine Heimann/Extender: Geralyn Corwin ?Weeks in Treatment: 0 ?Height (in): 68 ?Weight (lbs): 210 ?Body Mass Index (BMI): 31.9 ?Nutrition Risk Screening Items ?Score Screening ?NUTRITION RISK SCREEN: ?I have an illness or condition that made me change the kind and/or amount of food I eat 0 No ?I eat fewer than two meals per day 0 No ?I eat few fruits and vegetables, or milk products 0 No ?I have three or more drinks of beer, liquor or wine almost every day 0 No ?I have tooth or mouth problems that make it hard for me to eat 0 No ?I don't always have enough money to buy the food I need 0 No ?I eat alone most of the time 0 No ?I take three or more different prescribed or over-the-counter drugs a day 1 Yes ?Without wanting to, I have lost or gained 10 pounds in the last six months 0 No ?I am not always physically able to shop, cook and/or feed myself 0 No ?Nutrition Protocols ?Good Risk Protocol ?Moderate Risk Protocol ?High Risk Proctocol ?Risk Level: Good Risk ?Score: 1 ?Electronic Signature(s) ?Signed: 09/10/2021 4:04:50 PM By: Yevonne Pax RN ?Entered By: Yevonne Pax on 09/08/2021 09:03:30 ?

## 2021-09-10 NOTE — Progress Notes (Signed)
RYSZARD, SOCARRAS ([id number]) ?Visit Report for 09/08/2021 ?Chief Complaint Document Details ?Patient Name: Randall Vincent, Randall Vincent ?Date of Service: 09/08/2021 8:45 AM ?Medical Record Number: [id number] ?Patient Account Number: [id number] ?Date of Birth/Sex: 1940-06-18 (81 y.o. M) ?Treating RN: Carlene Coria ?Primary Care Provider: Elio Forget Other Clinician: ?Referring Provider: Referral, Self ?Treating Provider/Extender: Kalman Shan ?Weeks in Treatment: 0 ?Information Obtained from: Patient ?Chief Complaint ?09/08/2021; wound to the right upper extremity due to trauma ?Electronic Signature(s) ?Signed: 09/08/2021 9:48:32 AM By: Kalman Shan DO ?Entered By: Kalman Shan on 09/08/2021 09:21:23 ?RAYNOR, CALCATERRA ([id number]) ?-------------------------------------------------------------------------------- ?Debridement Details ?Patient Name: Randall Vincent, Randall Vincent ?Date of Service: 09/08/2021 8:45 AM ?Medical Record Number: [id number] ?Patient Account Number: [id number] ?Date of Birth/Sex: December 23, 1940 (81 y.o. M) ?Treating RN: Carlene Coria ?Primary Care Provider: Elio Forget Other Clinician: ?Referring Provider: Referral, Self ?Treating Provider/Extender: Kalman Shan ?Weeks in Treatment: 0 ?Debridement Performed for ?Wound #1 Right Elbow ?Assessment: ?Performed By: Physician Kalman Shan, MD ?Debridement Type: Debridement ?Level of Consciousness (Pre- ?Awake and Alert ?procedure): ?Pre-procedure Verification/Time Out ?Yes - 09:28 ?Taken: ?Start Time: 09:28 ?Total Area Debrided (L x W): 3 (cm) x 2 (cm) = 6 (cm?) ?Tissue and other material ?Viable, Non-Viable, Eschar, Subcutaneous, Skin: Dermis , Skin: Epidermis ?debrided: ?Level: Skin/Subcutaneous Tissue ?Debridement Description: Excisional ?Instrument: Curette ?Bleeding: Moderate ?Hemostasis Achieved: Pressure ?End Time: 09:35 ?Procedural Pain: 0 ?Post Procedural Pain: 0 ?Response to Treatment: Procedure was tolerated well ?Level of Consciousness (Post- ?Awake and  Alert ?procedure): ?Post Debridement Measurements of Total Wound ?Length: (cm) 5 ?Width: (cm) 2 ?Depth: (cm) 0.1 ?Volume: (cm?) 0.785 ?Character of Wound/Ulcer Post Debridement: Improved ?Post Procedure Diagnosis ?Same as Pre-procedure ?Electronic Signature(s) ?Signed: 09/08/2021 9:48:32 AM By: Kalman Shan DO ?Signed: 09/10/2021 4:04:50 PM By: Carlene Coria RN ?Entered By: Carlene Coria on 09/08/2021 09:31:13 ?YARIEL, FERRARIS ([id number]) ?-------------------------------------------------------------------------------- ?HPI Details ?Patient Name: Randall Vincent, Randall Vincent ?Date of Service: 09/08/2021 8:45 AM ?Medical Record Number: [id number] ?Patient Account Number: [id number] ?Date of Birth/Sex: 1940/12/27 (81 y.o. M) ?Treating RN: Carlene Coria ?Primary Care Provider: Elio Forget Other Clinician: ?Referring Provider: Referral, Self ?Treating Provider/Extender: Kalman Shan ?Weeks in Treatment: 0 ?History of Present Illness ?HPI Description: Admission 09/08/2021 ?Mr. Lacorey Brusca is a 82 year old male with a past medical history of congestive heart failure, CKD stage III, type 2 diabetes currently ?controlled on oral medications and coronary artery disease status post CABG x2 that presents the clinic for a wound on his right arm for the past ?two weeks. He states he was walking his dog and Got tripped over the leash and fell creating an abrasion to his right arm. He states he visited ?urgent care and they helped stop the bleeding. He has not been using any wound care dressings but has been keeping the area covered with a ?Band-Aid. He currently denies signs of infection. ?Electronic Signature(s) ?Signed: 09/08/2021 9:48:32 AM By: Kalman Shan DO ?Entered By: Kalman Shan on 09/08/2021 09:45:51 ?Randall Vincent, Randall Vincent ([id number]) ?-------------------------------------------------------------------------------- ?Physical Exam Details ?Patient Name: Randall Vincent, Randall Vincent ?Date of Service: 09/08/2021 8:45 AM ?Medical Record Number:  [id number] ?Patient Account Number: [id number] ?Date of Birth/Sex: [birth year] (81 y.o. M) ?Treating RN: Carlene Coria ?Primary Care Provider: Elio Forget Other Clinician: ?Referring Provider: Referral, Self ?Treating Provider/Extender: Kalman Shan ?Weeks in Treatment: 0 ?Constitutional ?. ?Psychiatric ?[name] ?Notes ?Right upper extremity: To the posterior lateral aspect near the elbow there is an open wound with nonviable tissue and granulation tissue present. ?No surrounding signs of infection. ?Electronic Signature(s) ?Signed: 09/08/2021 9:48:32 AM By: Kalman Shan DO ?Entered By: Kalman Shan on 09/08/2021 09:46:05 ?Randall Vincent (  [id number]) ?-------------------------------------------------------------------------------- ?Physician Orders Details ?Patient Name: Randall Vincent, Randall Vincent ?Date of Service: 09/08/2021 8:45 AM ?Medical Record Number: [id number] ?Patient Account Number: [id number] ?Date of Birth/Sex: 1941-02-03 (81 y.o. M) ?Treating RN: Carlene Coria ?Primary Care Provider: Elio Forget Other Clinician: ?Referring Provider: Referral, Self ?Treating Provider/Extender: Kalman Shan ?Weeks in Treatment: 0 ?Verbal / Phone Orders: No ?Diagnosis Coding ?ICD-10 Coding ?Code Description ?L98.492 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d ?S41.101A Unspecified open wound of right upper arm, initial encounter ?H38.871L Abrasion of right upper arm, initial encounter ?I25.10 Atherosclerotic heart disease of native coronary artery without angina pectoris ?E11.622 Type 2 diabetes mellitus with other skin ulcer ?N18.31 Chronic kidney disease, stage 3a ?I50.9 Heart failure, unspecified ?Follow-up Appointments ?o Return Appointment in 1 week. ?Bathing/ Shower/ Hygiene ?o May shower; gently cleanse wound with antibacterial soap, rinse and pat dry prior to dressing wounds ?Wound Treatment ?Wound #1 - Elbow Wound Laterality: Right ?Cleanser: Byram Ancillary Kit - 15 Day Supply (DME)  (Generic) 1 x Per Day/30 Days ?Discharge Instructions: Use supplies as instructed; Kit contains: (15) Saline Bullets; (15) 3x3 Gauze; 15 pr Gloves ?Cleanser: Soap and Water 1 x Per Day/30 Days ?Discharge Instructions: Gently cleanse wound with antibacterial soap, rinse and pat dry prior to dressing wounds ?Primary Dressing: Xeroform 4x4-HBD (in/in) (DME) (Generic) 1 x Per Day/30 Days ?Discharge Instructions: Apply Xeroform 4x4-HBD (in/in) as directed ?Secondary Dressing: (NON-BORDER) Zetuvit Plus Silicone NON-BORDER 5x5 (in/in) (DME) (Generic) 1 x Per Day/30 Days ?Discharge Instructions: Please do not put silicone bordered dressings under wraps. Use non-bordered dressing only under ?wraps. ?Electronic Signature(s) ?Signed: 09/08/2021 9:48:32 AM By: Kalman Shan DO ?Signed: 09/10/2021 4:04:50 PM By: Carlene Coria RN ?Entered By: Carlene Coria on 09/08/2021 09:33:38 ?Randall Vincent, Randall Vincent ([id number]) ?-------------------------------------------------------------------------------- ?Problem List Details ?Patient Name: Randall Vincent, Randall Vincent ?Date of Service: 09/08/2021 8:45 AM ?Medical Record Number: [id number] ?Patient Account Number: [id number] ?Date of Birth/Sex: [birth year] (81 y.o. M) ?Treating RN: Carlene Coria ?Primary Care Provider: Elio Forget Other Clinician: ?Referring Provider: Referral, Self ?Treating Provider/Extender: Jeri Cos ?Weeks in Treatment: 0 ?Active Problems ?ICD-10 ?Encounter ?Code Description Active Date MDM ?Diagnosis ?K93.552 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d 09/08/2021 No Yes ?S41.101A Unspecified open wound of right upper arm, initial encounter 09/08/2021 No Yes ?Z74.715N Abrasion of right upper arm, initial encounter 09/08/2021 No Yes ?I25.10 Atherosclerotic heart disease of native coronary artery without angina 09/08/2021 No Yes ?pectoris ?E11.622 Type 2 diabetes mellitus with other skin ulcer 09/08/2021 No Yes ?N18.31 Chronic kidney disease, stage 3a 09/08/2021 No  Yes ?I50.9 Heart failure, unspecified 09/08/2021 No Yes ?Inactive Problems ?Resolved Problems ?Electronic Signature(s) ?Signed: 09/08/2021 9:48:32 AM By: Kalman Shan DO ?Signed: 09/10/2021 5:32:35 PM By: Worthy Keeler PA-C ?En

## 2021-09-15 ENCOUNTER — Encounter (HOSPITAL_BASED_OUTPATIENT_CLINIC_OR_DEPARTMENT_OTHER): Payer: Medicare Other | Admitting: Internal Medicine

## 2021-09-15 ENCOUNTER — Emergency Department
Admission: EM | Admit: 2021-09-15 | Discharge: 2021-09-15 | Disposition: A | Discharge status: Home / Self Care | Payer: Medicare Other | Source: Home / Self Care | Attending: Emergency Medicine | Admitting: Emergency Medicine

## 2021-09-15 ENCOUNTER — Emergency Department: Payer: Medicare Other

## 2021-09-15 ENCOUNTER — Encounter: Payer: Self-pay | Admitting: Emergency Medicine

## 2021-09-15 DIAGNOSIS — E11622 Type 2 diabetes mellitus with other skin ulcer: Secondary | ICD-10-CM | POA: Diagnosis not present

## 2021-09-15 DIAGNOSIS — I130 Hypertensive heart and chronic kidney disease with heart failure and stage 1 through stage 4 chronic kidney disease, or unspecified chronic kidney disease: Secondary | ICD-10-CM | POA: Diagnosis not present

## 2021-09-15 DIAGNOSIS — S51812A Laceration without foreign body of left forearm, initial encounter: Secondary | ICD-10-CM | POA: Insufficient documentation

## 2021-09-15 DIAGNOSIS — S40811A Abrasion of right upper arm, initial encounter: Secondary | ICD-10-CM | POA: Diagnosis not present

## 2021-09-15 DIAGNOSIS — E119 Type 2 diabetes mellitus without complications: Secondary | ICD-10-CM | POA: Insufficient documentation

## 2021-09-15 DIAGNOSIS — W01198A Fall on same level from slipping, tripping and stumbling with subsequent striking against other object, initial encounter: Secondary | ICD-10-CM | POA: Insufficient documentation

## 2021-09-15 DIAGNOSIS — M542 Cervicalgia: Secondary | ICD-10-CM | POA: Insufficient documentation

## 2021-09-15 DIAGNOSIS — I110 Hypertensive heart disease with heart failure: Secondary | ICD-10-CM | POA: Insufficient documentation

## 2021-09-15 DIAGNOSIS — T148XXA Other injury of unspecified body region, initial encounter: Secondary | ICD-10-CM

## 2021-09-15 DIAGNOSIS — W19XXXA Unspecified fall, initial encounter: Secondary | ICD-10-CM

## 2021-09-15 DIAGNOSIS — S41101A Unspecified open wound of right upper arm, initial encounter: Secondary | ICD-10-CM | POA: Diagnosis not present

## 2021-09-15 DIAGNOSIS — S0181XA Laceration without foreign body of other part of head, initial encounter: Secondary | ICD-10-CM

## 2021-09-15 DIAGNOSIS — L98492 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d: Secondary | ICD-10-CM | POA: Diagnosis not present

## 2021-09-15 DIAGNOSIS — J9601 Acute respiratory failure with hypoxia: Secondary | ICD-10-CM | POA: Diagnosis not present

## 2021-09-15 DIAGNOSIS — I509 Heart failure, unspecified: Secondary | ICD-10-CM | POA: Insufficient documentation

## 2021-09-15 DIAGNOSIS — S0990XA Unspecified injury of head, initial encounter: Secondary | ICD-10-CM

## 2021-09-15 MED ORDER — LIDOCAINE-EPINEPHRINE-TETRACAINE (LET) TOPICAL GEL
3.0000 mL | Freq: Once | TOPICAL | Status: AC
Start: 2021-09-15 — End: 2021-09-15
  Administered 2021-09-15: 3 mL via TOPICAL
  Filled 2021-09-15: qty 3

## 2021-09-15 MED ORDER — LIDOCAINE HCL (PF) 1 % IJ SOLN
5.0000 mL | Freq: Once | INTRAMUSCULAR | Status: AC
Start: 2021-09-15 — End: 2021-09-15
  Administered 2021-09-15: 5 mL via INTRADERMAL
  Filled 2021-09-15: qty 5

## 2021-09-15 NOTE — ED Notes (Signed)
Pt's head lac wrapped with xeroform gauze, telfa dressing, and gauze wrap.  ?

## 2021-09-15 NOTE — Progress Notes (Signed)
Hoak, Stephone ([id number]) ?Visit Report for 09/15/2021 ?Chief Complaint Document Details ?Patient Name: Randall Vincent, Randall Vincent ?Date of Service: 09/15/2021 8:00 AM ?Medical Record Number: [id number] ?Patient Account Number: [id number] ?Date of Birth/Sex: 05/17/1941 (81 y.o. M) ?Treating RN: Epps, Carrie ?Primary Care Provider: TERRELL, GRACE Other Clinician: ?Referring Provider: TERRELL, GRACE ?Treating Provider/Extender: Hoffman, Jessica ?Weeks in Treatment: 1 ?Information Obtained from: Patient ?Chief Complaint ?09/08/2021; wound to the right upper extremity due to trauma ?Electronic Signature(s) ?Signed: 09/15/2021 8:55:50 AM By: Hoffman, Jessica DO ?Entered By: Hoffman, Jessica on 09/15/2021 08:25:36 ?Damaso, Iain ([id number]) ?-------------------------------------------------------------------------------- ?HPI Details ?Patient Name: Randall Vincent, Randall Vincent ?Date of Service: 09/15/2021 8:00 AM ?Medical Record Number: [id number] ?Patient Account Number: [id number] ?Date of Birth/Sex: 07/08/1940 (81 y.o. M) ?Treating RN: Epps, Carrie ?Primary Care Provider: TERRELL, GRACE Other Clinician: ?Referring Provider: TERRELL, GRACE ?Treating Provider/Extender: Hoffman, Jessica ?Weeks in Treatment: 1 ?History of Present Illness ?HPI Description: Admission 09/08/2021 ?Randall Vincent is a 81-year-old male with a past medical history of congestive heart failure, CKD stage III, type 2 diabetes currently ?controlled on oral medications and coronary artery disease status post CABG x2 that presents the clinic for a wound on his right arm for the past ?two weeks. He states he was walking his dog and Got tripped over the leash and fell creating an abrasion to his right arm. He states he visited ?urgent care and they helped stop the bleeding. He has not been using any wound care dressings but has been keeping the area covered with a ?Band-Aid. He currently denies signs of infection. ?4/26; patient presents for follow-up. He has been using Xeroform dressings  without issues. He reports improvement in wound healing. He has no ?issues or complaints today. ?Electronic Signature(s) ?Signed: 09/15/2021 8:55:50 AM By: Hoffman, Jessica DO ?Entered By: Hoffman, Jessica on 09/15/2021 08:25:55 ?Kesinger, Deanta ([id number]) ?-------------------------------------------------------------------------------- ?Physical Exam Details ?Patient Name: Randall Vincent, Randall Vincent ?Date of Service: 09/15/2021 8:00 AM ?Medical Record Number: [id number] ?Patient Account Number: [id number] ?Date of Birth/Sex: 10/02/1940 (81 y.o. M) ?Treating RN: Epps, Carrie ?Primary Care Provider: TERRELL, GRACE Other Clinician: ?Referring Provider: TERRELL, GRACE ?Treating Provider/Extender: Hoffman, Jessica ?Weeks in Treatment: 1 ?Constitutional ?. ?Psychiatric ?. ?Notes ?Right upper extremity: To the posterior lateral aspect near the elbow there is an open wound with granulation tissue throughout. Appears well- ?healing. No surrounding signs of infection. ?Electronic Signature(s) ?Signed: 09/15/2021 8:55:50 AM By: Hoffman, Jessica DO ?Entered By: Hoffman, Jessica on 09/15/2021 08:26:22 ?Rogus, Cincere ([id number]) ?-------------------------------------------------------------------------------- ?Physician Orders Details ?Patient Name: Randall Vincent, Randall Vincent ?Date of Service: 09/15/2021 8:00 AM ?Medical Record Number: [id number] ?Patient Account Number: [id number] ?Date of Birth/Sex: 07/13/1940 (81 y.o. M) ?Treating RN: Epps, Carrie ?Primary Care Provider: TERRELL, GRACE Other Clinician: ?Referring Provider: TERRELL, GRACE ?Treating Provider/Extender: Hoffman, Jessica ?Weeks in Treatment: 1 ?Verbal / Phone Orders: No ?Diagnosis Coding ?Follow-up Appointments ?o Return Appointment in 2 weeks. ?Bathing/ Shower/ Hygiene ?o May shower; gently cleanse wound with antibacterial soap, rinse and pat dry prior to dressing wounds ?Wound Treatment ?Wound #1 - Elbow Wound Laterality: Right ?Cleanser: Byram Ancillary Kit - 15 Day Supply (Generic) 1 x Per  Day/30 Days ?Discharge Instructions: Use supplies as instructed; Kit contains: (15) Saline Bullets; (15) 3x3 Gauze; 15 pr Gloves ?Cleanser: Soap and Water 1 x Per Day/30 Days ?Discharge Instructions: Gently cleanse wound with antibacterial soap, rinse and pat dry prior to dressing wounds ?Primary Dressing: Xeroform 4x4-HBD (in/in) (Generic) 1 x Per Day/30 Days ?Discharge Instructions: Apply Xeroform 4x4-HBD (in/in) as directed ?Secondary Dressing: (SILCONE BORDER) Zetuvit Plus SILICONE BORDER Dressing 5x5 (in/in) (DME) (  Generic) 1 x Per Day/30 Days ?Discharge Instructions: Please do not put silicone bordered dressings under wraps. Use non-bordered dressing only. ?Electronic Signature(s) ?Signed: 09/15/2021 8:55:50 AM By: Kalman Shan DO ?Entered By: Kalman Shan on 09/15/2021 08:27:51 ?DUSTY, RACZKOWSKI ([id number]) ?-------------------------------------------------------------------------------- ?Problem List Details ?Patient Name: Randall Vincent, Randall Vincent ?Date of Service: 09/15/2021 8:00 AM ?Medical Record Number: [id number] ?Patient Account Number: [id number] ?Date of Birth/Sex: 1941/01/11 (81 y.o. M) ?Treating RN: Carlene Coria ?Primary Care Provider: Elio Forget Other Clinician: ?Referring Provider: Enid Derry, GRACE ?Treating Provider/Extender: Kalman Shan ?Weeks in Treatment: 1 ?Active Problems ?ICD-10 ?Encounter ?Code Description Active Date MDM ?Diagnosis ?A83.419 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d 09/08/2021 No Yes ?S41.101A Unspecified open wound of right upper arm, initial encounter 09/08/2021 No Yes ?Q22.297L Abrasion of right upper arm, initial encounter 09/08/2021 No Yes ?I25.10 Atherosclerotic heart disease of native coronary artery without angina 09/08/2021 No Yes ?pectoris ?E11.622 Type 2 diabetes mellitus with other skin ulcer 09/08/2021 No Yes ?N18.31 Chronic kidney disease, stage 3a 09/08/2021 No Yes ?I50.9 Heart failure, unspecified 09/08/2021 No Yes ?Inactive  Problems ?Resolved Problems ?Electronic Signature(s) ?Signed: 09/15/2021 8:55:50 AM By: Kalman Shan DO ?Entered By: Kalman Shan on 09/15/2021 08:25:30 ?JAYCEN, VERCHER ([id number]) ?-------------------------------------------------------------------------------- ?Progress Note Details ?Patient Name: Randall Vincent, PLAIN ?Date of Service: 09/15/2021 8:00 AM ?Medical Record Number: [id number] ?Patient Account Number: [id number] ?Date of Birth/Sex: 04-05-1941 (81 y.o. M) ?Treating RN: Carlene Coria ?Primary Care Provider: Elio Forget Other Clinician: ?Referring Provider: Enid Derry, GRACE ?Treating Provider/Extender: Kalman Shan ?Weeks in Treatment: 1 ?Subjective ?Chief Complaint ?Information obtained from Patient ?09/08/2021; wound to the right upper extremity due to trauma ?History of Present Illness (HPI) ?Admission 09/08/2021 ?Mr. Linville Decarolis is a 81 year old male with a past medical history of congestive heart failure, CKD stage III, type 2 diabetes currently ?controlled on oral medications and coronary artery disease status post CABG x2 that presents the clinic for a wound on his right arm for the past ?two weeks. He states he was walking his dog and Got tripped over the leash and fell creating an abrasion to his right arm. He states he visited ?urgent care and they helped stop the bleeding. He has not been using any wound care dressings but has been keeping the area covered with a ?Band-Aid. He currently denies signs of infection. ?4/26; patient presents for follow-up. He has been using Xeroform dressings without issues. He reports improvement in wound healing. He has no ?issues or complaints today. ?Objective ?Constitutional ?Vitals Time Taken: 8:07 AM, Height: 68 in, Weight: 210 lbs, BMI: 31.9, Temperature: 98 ??F, Pulse: 88 bpm, Respiratory Rate: 18 breaths/min, ?Blood Pressure: 155/74 mmHg. ?General Notes: Right upper extremity: To the posterior lateral aspect near the elbow there is an open wound with  granulation tissue throughout. ?Appears well-healing. No surrounding signs of infection. ?Integumentary (Hair, Skin) ?Wound #1 status is Open. Original cause of wound was Trauma. The date acquired was: 08/23/2021. Terie Purser

## 2021-09-15 NOTE — ED Triage Notes (Signed)
Fall, head injury.  Arrives, c/o fall due to tripping while outside.  States hit right forehead on edge of smoker.  Denies LOC.  Bleeding controlled.  DSD applied.  Aldo abrasions to left forearm, bandaids placed PTA. ? ?Son states patient has fallen 5 times recently. ? ?Patient is AAOx3.  Skin warm and dry.  Takes Eliquis. ?

## 2021-09-15 NOTE — ED Provider Notes (Signed)
? ?[facility] ?Provider Note ? ? ? Event Date/Time  ? First MD Initiated Contact with Patient 09/15/21 1631   ?  (approximate) ? ? ?History  ? ?Fall ? ? ?HPI ? ?Randall Vincent is a 81 y.o. male with history of multiple falls this year along with diabetes, hypertension, CHF other long-term diseases presents emergency department after a fall.  Patient fell and hit his head on a smoker.  Complaining of laceration to the forehead, some neck pain, multiple skin tears on the left forearm.  Patient's Tdap is up-to-date. ? ?  ? ? ?Physical Exam  ? ?Triage Vital Signs: ?ED Triage Vitals [09/15/21 1542]  ?Enc Vitals Group  ?   BP (!) 147/71  ?   Pulse Rate 83  ?   Resp 19  ?   Temp 98.1 ?F (36.7 ?C)  ?   Temp src   ?   SpO2 94 %  ?   Weight   ?   Height   ?   Head Circumference   ?   Peak Flow   ?   Pain Score   ?   Pain Loc   ?   Pain Edu?   ?   Excl. in GC?   ? ? ?Most recent vital signs: ?Vitals:  ? 09/15/21 1542  ?BP: (!) 147/71  ?Pulse: 83  ?Resp: 19  ?Temp: 98.1 ?F (36.7 ?C)  ?SpO2: 94%  ? ? ? ?General: Awake, no distress.   ?CV:  Good peripheral perfusion. regular rate and  rhythm ?Resp:  Normal effort.  ?Abd:  No distention.   ?Other:  Multiple skin tears noted on the left forearm, laceration noted to the right side of the forehead, active bleeding noted ? ? ?ED Results / Procedures / Treatments  ? ?Labs ?(all labs ordered are listed, but only abnormal results are displayed) ?Labs Reviewed - No data to display ? ? ?EKG ? ? ? ? ?RADIOLOGY ?CT of the head, maxillofacial, C-spine ? ? ? ?PROCEDURES: ? ? ?[name].Laceration Repair ? ?Date/Time: 09/15/2021 6:39 PM ?Performed by: Faythe Ghee, PA-C ?Authorized by: Faythe Ghee, PA-C  ? ?Consent:  ?  Consent obtained:  Verbal ?  Consent given by:  Patient ?  Risks, benefits, and alternatives were discussed: yes   ?  Risks discussed:  Infection, pain, retained foreign body, tendon damage, need for additional repair, poor cosmetic result, nerve damage, poor  wound healing and vascular damage ?  Alternatives discussed:  Delayed treatment ?Universal protocol:  ?  Procedure explained and questions answered to patient or proxy's satisfaction: yes   ?  Immediately prior to procedure, a time out was called: yes   ?  Patient identity confirmed:  Verbally with patient ?Anesthesia:  ?  Anesthesia method:  Topical application and local infiltration ?  Topical anesthetic:  LET ?  Local anesthetic:  Lidocaine 1% w/o epi ?Laceration details:  ?  Location:  Face ?  Face location:  Forehead ?  Length (cm):  4 ?Pre-procedure details:  ?  Preparation:  Patient was prepped and draped in usual sterile fashion ?Exploration:  ?  Hemostasis achieved with:  LET ?  Imaging outcome: foreign body not noted   ?  Wound exploration: wound explored through full range of motion   ?  Wound extent: no areolar tissue violation noted, no fascia violation noted, no foreign bodies/material noted, no muscle damage noted, no nerve damage noted, no tendon damage noted, no underlying fracture noted  and no vascular damage noted   ?  Contaminated: no   ?Treatment:  ?  Area cleansed with:  Povidone-iodine and saline ?  Amount of cleaning:  Standard ?  Irrigation solution:  Sterile saline ?  Irrigation method:  Tap ?Skin repair:  ?  Repair method:  Sutures ?  Suture size:  4-0 ?  Suture material:  Prolene ?  Suture technique:  Simple interrupted ?  Number of sutures:  4 ?Approximation:  ?  Approximation:  Close ?Repair type:  ?  Repair type:  Simple ?Post-procedure details:  ?  Dressing:  Non-adherent dressing ?  Procedure completion:  Tolerated well, no immediate complications ?[name].Laceration Repair ? ?Date/Time: 09/15/2021 6:40 PM ?Performed by: Faythe Ghee, PA-C ?Authorized by: Faythe Ghee, PA-C  ? ?Consent:  ?  Consent obtained:  Verbal ?  Consent given by:  Patient ?  Risks, benefits, and alternatives were discussed: yes   ?  Risks discussed:  Infection, pain, retained foreign body, need for additional  repair, nerve damage, poor wound healing, poor cosmetic result, tendon damage and vascular damage ?  Alternatives discussed:  No treatment ?Universal protocol:  ?  Procedure explained and questions answered to patient or proxy's satisfaction: yes   ?  Immediately prior to procedure, a time out was called: yes   ?  Patient identity confirmed:  Verbally with patient ?Anesthesia:  ?  Anesthesia method:  None ?Laceration details:  ?  Location:  Shoulder/arm ?  Shoulder/arm location:  L lower arm ?  Length (cm):  2 ?Exploration:  ?  Limited defect created (wound extended): no   ?  Imaging outcome: foreign body not noted   ?  Wound extent: no areolar tissue violation noted, no fascia violation noted, no foreign bodies/material noted, no muscle damage noted, no nerve damage noted, no tendon damage noted, no underlying fracture noted and no vascular damage noted   ?  Contaminated: no   ?Treatment:  ?  Area cleansed with:  Saline ?  Amount of cleaning:  Standard ?  Irrigation solution:  Sterile saline ?  Irrigation method:  Tap ?Skin repair:  ?  Repair method:  Tissue adhesive ?Approximation:  ?  Approximation:  Close ?Repair type:  ?  Repair type:  Simple ?Post-procedure details:  ?  Dressing:  Open (no dressing) ?  Procedure completion:  Tolerated well, no immediate complications ?Comments:  ?   Multiple skin tears on left forearm with largest measuring 2 cm, 3 were closed with dermabond ? ? ?MEDICATIONS ORDERED IN ED: ?Medications  ?lidocaine-EPINEPHrine-tetracaine (LET) topical gel (3 mLs Topical Given by Other 09/15/21 1806)  ?lidocaine (PF) (XYLOCAINE) 1 % injection 5 mL (5 mLs Intradermal Given by Other 09/15/21 1806)  ? ? ? ?IMPRESSION / MDM / ASSESSMENT AND PLAN / ED COURSE  ?I reviewed the triage vital signs and the nursing notes. ?             ?               ? ?Differential diagnosis includes, but is not limited to, laceration to the forehead, skin tears to the left forearm, subdural, SAH, C-spine fracture,  maxillofacial fracture ? ?CT of the head, C-spine, and maxillofacial were independently reviewed by me.  Not see any acute abnormalities.  These are confirmed by radiology. ? ?See procedure note for laceration repair. ? ?Suture instructions and tissue adhesive instructions were explained to the patient and his son.  They are to have his  regular doctor remove sutures in 1 week.  Keep the areas clean and dry as possible.  They are in agreement treatment plan.  If he develops worsening headache due to the head injury he should return back to the emergency department.  They are in agreement with treatment plan.  He was discharged stable condition. ? ? ? ? ?  ? ? ?FINAL CLINICAL IMPRESSION(S) / ED DIAGNOSES  ? ?Final diagnoses:  ?Fall, initial encounter  ?Forehead laceration, initial encounter  ?Multiple skin tears  ?Injury of head, initial encounter  ? ? ? ?Rx / DC Orders  ? ?ED Discharge Orders   ? ? None  ? ?  ? ? ? ?Note:  This document was prepared using Dragon voice recognition software and may include unintentional dictation errors. ? ?  ?Faythe GheeFisher, Dulcemaria Bula W, PA-C ?09/15/21 1843 ? ?  ?Minna AntisPaduchowski, Kevin, MD ?09/15/21 2125 ? ?

## 2021-09-15 NOTE — Progress Notes (Signed)
TYRIS, ELIOT ([id number]) ?Visit Report for 09/15/2021 ?Arrival Information Details ?Patient Name: Randall Vincent, Randall Vincent ?Date of Service: 09/15/2021 8:00 AM ?Medical Record Number: [id number] ?Patient Account Number: [id number] ?Date of Birth/Sex: 04/23/1941 (81 y.o. M) ?Treating RN: Yevonne Pax ?Primary Care Carles Florea: Patrina Levering Other Clinician: ?Referring Gerhardt Gleed: Landry Mellow, GRACE ?Treating Sarthak Rubenstein/Extender: Geralyn Corwin ?Weeks in Treatment: 1 ?Visit Information History Since Last Visit ?All ordered tests and consults were completed: No ?Patient Arrived: Gilmer Mor ?Added or deleted any medications: No ?Arrival Time: 08:05 ?Any new allergies or adverse reactions: No ?Accompanied By: self ?Had a fall or experienced change in No ?Transfer Assistance: None ?activities of daily living that may affect ?Patient Identification Verified: Yes ?risk of falls: ?Secondary Verification Process Completed: Yes ?Signs or symptoms of abuse/neglect since last visito No ?Patient Requires Transmission-Based Precautions: No ?Hospitalized since last visit: No ?Patient Has Alerts: No ?Implantable device outside of the clinic excluding No ?cellular tissue based products placed in the center ?since last visit: ?Has Dressing in Place as Prescribed: Yes ?Pain Present Now: No ?Electronic Signature(s) ?Signed: 09/15/2021 8:47:39 AM By: Yevonne Pax RN ?Entered ByYevonne Pax on 09/15/2021 08:07:22 ?Randall Vincent, Randall Vincent ([id number]) ?-------------------------------------------------------------------------------- ?Clinic Level of Care Assessment Details ?Patient Name: Randall Vincent, Randall Vincent ?Date of Service: 09/15/2021 8:00 AM ?Medical Record Number: [id number] ?Patient Account Number: [id number] ?Date of Birth/Sex: 06/07/1940 (81 y.o. M) ?Treating RN: Yevonne Pax ?Primary Care Shamikia Linskey: Patrina Levering Other Clinician: ?Referring Catelin Manthe: Landry Mellow, GRACE ?Treating Everlee Quakenbush/Extender: Geralyn Corwin ?Weeks in Treatment: 1 ?Clinic Level of Care Assessment Items ?TOOL 4  Quantity Score ?X - Use when only an EandM is performed on FOLLOW-UP visit 1 0 ?ASSESSMENTS - Nursing Assessment / Reassessment ?X - Reassessment of Co-morbidities (includes updates in patient status) 1 10 ?X- 1 5 ?Reassessment of Adherence to Treatment Plan ?ASSESSMENTS - Wound and Skin Assessment / Reassessment ?X - Simple Wound Assessment / Reassessment - one wound 1 5 ?[]  - 0 ?Complex Wound Assessment / Reassessment - multiple wounds ?[]  - 0 ?Dermatologic / Skin Assessment (not related to wound area) ?ASSESSMENTS - Focused Assessment ?[]  - Circumferential Edema Measurements - multi extremities 0 ?[]  - 0 ?Nutritional Assessment / Counseling / Intervention ?[]  - 0 ?Lower Extremity Assessment (monofilament, tuning fork, pulses) ?[]  - 0 ?Peripheral Arterial Disease Assessment (using hand held doppler) ?ASSESSMENTS - Ostomy and/or Continence Assessment and Care ?[]  - Incontinence Assessment and Management 0 ?[]  - 0 ?Ostomy Care Assessment and Management (repouching, etc.) ?PROCESS - Coordination of Care ?X - Simple Patient / Family Education for ongoing care 1 15 ?[]  - 0 ?Complex (extensive) Patient / Family Education for ongoing care ?[]  - 0 ?Staff obtains Consents, Records, Test Results / Process Orders ?[]  - 0 ?Staff telephones HHA, Nursing Homes / Clarify orders / etc ?[]  - 0 ?Routine Transfer to another Facility (non-emergent condition) ?[]  - 0 ?Routine Hospital Admission (non-emergent condition) ?[]  - 0 ?New Admissions / / Ordering NPWT, Apligraf, etc. ?[]  - 0 ?Emergency Hospital Admission (emergent condition) ?X- 1 10 ?Simple Discharge Coordination ?[]  - 0 ?Complex (extensive) Discharge Coordination ?PROCESS - Special Needs ?[]  - Pediatric / Minor Patient Management 0 ?[]  - 0 ?Isolation Patient Management ?[]  - 0 ?Hearing / Language / Visual special needs ?[]  - 0 ?Assessment of Community assistance (transportation, D/C planning, etc.) ?[]  - 0 ?Additional assistance / Altered  mentation ?[]  - 0 ?Support Surface(s) Assessment (bed, cushion, seat, etc.) ?INTERVENTIONS - Wound Cleansing / Measurement ?Randall Vincent, Randall Vincent ( ) ?X- 1 5 ?Simple Wound Cleansing - one wound ?[]  - 0 ?  Complex Wound Cleansing - multiple wounds ?[]  - 0 ?Wound Imaging (photographs - any number of wounds) ?[]  - 0 ?Wound Tracing (instead of photographs) ?X- 1 5 ?Simple Wound Measurement - one wound ?[]  - 0 ?Complex Wound Measurement - multiple wounds ?INTERVENTIONS - Wound Dressings ?X - Small Wound Dressing one or multiple wounds 1 10 ?[]  - 0 ?Medium Wound Dressing one or multiple wounds ?[]  - 0 ?Large Wound Dressing one or multiple wounds ?[]  - 0 ?Application of Medications - topical ?[]  - 0 ?Application of Medications - injection ?INTERVENTIONS - Miscellaneous ?[]  - External ear exam 0 ?[]  - 0 ?Specimen Collection (cultures, biopsies, blood, body fluids, etc.) ?[]  - 0 ?Specimen(s) / Culture(s) sent or taken to Lab for analysis ?[]  - 0 ?Patient Transfer (multiple staff / / Similar devices) ?[]  - 0 ?Simple Staple / Suture removal (25 or less) ?[]  - 0 ?Complex Staple / Suture removal (26 or more) ?[]  - 0 ?Hypo / Hyperglycemic Management (close monitor of Blood Glucose) ?[]  - 0 ?Ankle / Brachial Index (ABI) - do not check if billed separately ?X- 1 5 ?Vital Signs ?Has the patient been seen at the hospital within the last three years: Yes ?Total Score: 70 ?Level Of Care: New/Established - Level ?2 ?Electronic Signature(s) ?Signed: 09/15/2021 8:47:39 AM By: RN ?Entered By: on 09/15/2021 08:23:19 ?Randall Vincent, Randall Vincent ( ) ?-------------------------------------------------------------------------------- ?Encounter Discharge Information Details ?Patient Name: Randall Vincent, Randall Vincent ?Date of Service: 09/15/2021 8:00 AM ?Medical Record Number: ?Patient Account Number: ?Date of Birth/Sex: 26-Jan-1941 (81 y.o. M) ?Treating RN: ?Primary Care Belen Zwahlen:  Other Clinician: ?Referring Byard Carranza: , GRACE ?Treating Lattie Riege/Extender: 09/17/2021 ?Weeks in Treatment: 1 ?Encounter Discharge Information Items ?Discharge Condition: Stable ?Ambulatory Status: Ambulatory ?Discharge Destination: Home ?Transportation: Private Auto ?Accompanied By: self ?Schedule Follow-up Appointment: Yes ?Clinical Summary of Care: Patient Declined ?Electronic Signature(s) ?Signed: 09/15/2021 8:47:39 AM By: Yevonne Pax RN ?Entered By: 09/17/2021 on 09/15/2021 08:24:11 ?Randall Vincent, Randall Vincent ([name]) ?-------------------------------------------------------------------------------- ?Lower Extremity Assessment Details ?Patient Name: Randall Vincent, Randall Vincent ?Date of Service: 09/15/2021 8:00 AM ?Medical Record Number: [id number] ?Patient Account Number: 02/24/1941 ?Date of Birth/Sex: 27-Aug-1940 (81 y.o. M) ?Treating RN: Patrina Levering ?Primary Care Gerry Heaphy: Landry Mellow Other Clinician: ?Referring Tryone Kille: Geralyn Corwin, GRACE ?Treating Carroll Ranney/Extender: 09/17/2021 ?Weeks in Treatment: 1 ?Electronic Signature(s) ?Signed: 09/15/2021 8:47:39 AM By: Yevonne Pax RN ?Entered By: 09/17/2021 on 09/15/2021 08:14:36 ?Randall Vincent, Randall Vincent ([name]) ?-------------------------------------------------------------------------------- ?Multi Wound Chart Details ?Patient Name: Randall Vincent, Randall Vincent ?Date of Service: 09/15/2021 8:00 AM ?Medical Record Number: [id number] ?Patient Account Number: 02/24/1941 ?Date of Birth/Sex: 1940-09-05 (81 y.o. M) ?Treating RN: Patrina Levering ?Primary Care Reed Eifert: Landry Mellow Other Clinician: ?Referring Shatia Sindoni: Geralyn Corwin, GRACE ?Treating Latriece Anstine/Extender: 09/17/2021 ?Weeks in Treatment: 1 ?Vital Signs ?Height(in): 68 ?Pulse(bpm): 88 ?Weight(lbs): 210 ?Blood Pressure(mmHg): 155/74 ?Body Mass Index(BMI): 31.9 ?Temperature(??F): 98 ?Respiratory Rate(breaths/min): 18 ?Photos: [N/A:N/A] ?Wound Location: Right Elbow N/A N/A ?Wounding Event: Trauma N/A N/A ?Primary Etiology: Skin Tear N/A  N/A ?Comorbid History: Arrhythmia, Congestive Heart N/A N/A ?Failure, Coronary Artery Disease, ?Hypertension, Type II Diabetes ?Date Acquired: 08/23/2021 N/A N/A ?Weeks of Treatment: 1 N/A N/A ?Wound Status: Open N/A N/A ?Wo

## 2021-09-15 NOTE — Discharge Instructions (Signed)
Follow-up with your regular doctor in 1 week for suture removal.  He also may remove them yourself or return emergency department or urgent care.  Return the emergency department if worsening headache or confusion. ?Keep the dermabonded areas as dry as possible ?Take tylenol for pain as needed ?

## 2021-09-15 NOTE — ED Notes (Signed)
Pt. Discharged prior to RN assessment. 

## 2021-09-16 ENCOUNTER — Encounter: Payer: Self-pay | Admitting: Emergency Medicine

## 2021-09-16 ENCOUNTER — Emergency Department: Payer: Medicare Other

## 2021-09-16 ENCOUNTER — Other Ambulatory Visit: Payer: Self-pay

## 2021-09-16 ENCOUNTER — Inpatient Hospital Stay
Admission: EM | Admit: 2021-09-16 | Discharge: 2021-09-20 | DRG: 291 | Disposition: A | Discharge status: Home / Self Care | Payer: Medicare Other | Attending: Family Medicine | Admitting: Family Medicine

## 2021-09-16 DIAGNOSIS — E785 Hyperlipidemia, unspecified: Secondary | ICD-10-CM | POA: Diagnosis present

## 2021-09-16 DIAGNOSIS — E119 Type 2 diabetes mellitus without complications: Secondary | ICD-10-CM

## 2021-09-16 DIAGNOSIS — K761 Chronic passive congestion of liver: Secondary | ICD-10-CM | POA: Diagnosis present

## 2021-09-16 DIAGNOSIS — N1832 Chronic kidney disease, stage 3b: Secondary | ICD-10-CM | POA: Diagnosis present

## 2021-09-16 DIAGNOSIS — Z9181 History of falling: Secondary | ICD-10-CM | POA: Diagnosis not present

## 2021-09-16 DIAGNOSIS — I130 Hypertensive heart and chronic kidney disease with heart failure and stage 1 through stage 4 chronic kidney disease, or unspecified chronic kidney disease: Secondary | ICD-10-CM | POA: Diagnosis present

## 2021-09-16 DIAGNOSIS — I5033 Acute on chronic diastolic (congestive) heart failure: Secondary | ICD-10-CM | POA: Diagnosis present

## 2021-09-16 DIAGNOSIS — Z951 Presence of aortocoronary bypass graft: Secondary | ICD-10-CM | POA: Diagnosis not present

## 2021-09-16 DIAGNOSIS — Z955 Presence of coronary angioplasty implant and graft: Secondary | ICD-10-CM | POA: Diagnosis not present

## 2021-09-16 DIAGNOSIS — I480 Paroxysmal atrial fibrillation: Secondary | ICD-10-CM | POA: Diagnosis present

## 2021-09-16 DIAGNOSIS — S51812A Laceration without foreign body of left forearm, initial encounter: Secondary | ICD-10-CM | POA: Diagnosis present

## 2021-09-16 DIAGNOSIS — I081 Rheumatic disorders of both mitral and tricuspid valves: Secondary | ICD-10-CM | POA: Diagnosis present

## 2021-09-16 DIAGNOSIS — R7989 Other specified abnormal findings of blood chemistry: Secondary | ICD-10-CM | POA: Diagnosis present

## 2021-09-16 DIAGNOSIS — I248 Other forms of acute ischemic heart disease: Secondary | ICD-10-CM | POA: Diagnosis not present

## 2021-09-16 DIAGNOSIS — R079 Chest pain, unspecified: Secondary | ICD-10-CM | POA: Diagnosis present

## 2021-09-16 DIAGNOSIS — I4510 Unspecified right bundle-branch block: Secondary | ICD-10-CM | POA: Diagnosis present

## 2021-09-16 DIAGNOSIS — G4733 Obstructive sleep apnea (adult) (pediatric): Secondary | ICD-10-CM | POA: Diagnosis present

## 2021-09-16 DIAGNOSIS — E1122 Type 2 diabetes mellitus with diabetic chronic kidney disease: Secondary | ICD-10-CM | POA: Diagnosis present

## 2021-09-16 DIAGNOSIS — D631 Anemia in chronic kidney disease: Secondary | ICD-10-CM | POA: Diagnosis present

## 2021-09-16 DIAGNOSIS — Z79899 Other long term (current) drug therapy: Secondary | ICD-10-CM

## 2021-09-16 DIAGNOSIS — Z8719 Personal history of other diseases of the digestive system: Secondary | ICD-10-CM

## 2021-09-16 DIAGNOSIS — J9601 Acute respiratory failure with hypoxia: Secondary | ICD-10-CM

## 2021-09-16 DIAGNOSIS — R0902 Hypoxemia: Secondary | ICD-10-CM | POA: Diagnosis present

## 2021-09-16 DIAGNOSIS — N1831 Chronic kidney disease, stage 3a: Secondary | ICD-10-CM | POA: Diagnosis present

## 2021-09-16 DIAGNOSIS — Z87891 Personal history of nicotine dependence: Secondary | ICD-10-CM

## 2021-09-16 DIAGNOSIS — I10 Essential (primary) hypertension: Secondary | ICD-10-CM | POA: Diagnosis present

## 2021-09-16 DIAGNOSIS — D649 Anemia, unspecified: Secondary | ICD-10-CM

## 2021-09-16 DIAGNOSIS — R296 Repeated falls: Secondary | ICD-10-CM | POA: Diagnosis present

## 2021-09-16 DIAGNOSIS — Z96653 Presence of artificial knee joint, bilateral: Secondary | ICD-10-CM | POA: Diagnosis present

## 2021-09-16 DIAGNOSIS — I509 Heart failure, unspecified: Principal | ICD-10-CM

## 2021-09-16 DIAGNOSIS — I2510 Atherosclerotic heart disease of native coronary artery without angina pectoris: Secondary | ICD-10-CM | POA: Diagnosis present

## 2021-09-16 DIAGNOSIS — Z7984 Long term (current) use of oral hypoglycemic drugs: Secondary | ICD-10-CM

## 2021-09-16 DIAGNOSIS — S0181XA Laceration without foreign body of other part of head, initial encounter: Secondary | ICD-10-CM | POA: Diagnosis present

## 2021-09-16 DIAGNOSIS — W1830XA Fall on same level, unspecified, initial encounter: Secondary | ICD-10-CM | POA: Diagnosis present

## 2021-09-16 DIAGNOSIS — I252 Old myocardial infarction: Secondary | ICD-10-CM

## 2021-09-16 DIAGNOSIS — R778 Other specified abnormalities of plasma proteins: Secondary | ICD-10-CM | POA: Diagnosis present

## 2021-09-16 DIAGNOSIS — Z20822 Contact with and (suspected) exposure to covid-19: Secondary | ICD-10-CM | POA: Diagnosis present

## 2021-09-16 DIAGNOSIS — Z7901 Long term (current) use of anticoagulants: Secondary | ICD-10-CM

## 2021-09-16 DIAGNOSIS — I4891 Unspecified atrial fibrillation: Secondary | ICD-10-CM | POA: Diagnosis present

## 2021-09-16 LAB — TROPONIN I (HIGH SENSITIVITY)
Troponin I (High Sensitivity): 46 ng/L — ABNORMAL HIGH (ref <18)
Troponin I (High Sensitivity): 46 ng/L — ABNORMAL HIGH (ref <18)

## 2021-09-16 LAB — RESP PANEL BY RT-PCR (FLU A&B, COVID) ARPGX2
Influenza A by PCR: NEGATIVE
Influenza B by PCR: NEGATIVE
SARS Coronavirus 2 by RT PCR: NEGATIVE

## 2021-09-16 LAB — HEPATIC FUNCTION PANEL
ALT: 49 U/L — ABNORMAL HIGH (ref 0–44)
AST: 39 U/L (ref 15–41)
Albumin: 3.1 g/dL — ABNORMAL LOW (ref 3.5–5.0)
Alkaline Phosphatase: 133 U/L — ABNORMAL HIGH (ref 38–126)
Bilirubin, Direct: 0.5 mg/dL — ABNORMAL HIGH (ref 0.0–0.2)
Indirect Bilirubin: 1 mg/dL — ABNORMAL HIGH (ref 0.3–0.9)
Total Bilirubin: 1.5 mg/dL — ABNORMAL HIGH (ref 0.3–1.2)
Total Protein: 7.2 g/dL (ref 6.5–8.1)

## 2021-09-16 LAB — CBC
HCT: 32.1 % — ABNORMAL LOW (ref 39.0–52.0)
Hemoglobin: 9.6 g/dL — ABNORMAL LOW (ref 13.0–17.0)
MCH: 25.8 pg — ABNORMAL LOW (ref 26.0–34.0)
MCHC: 29.9 g/dL — ABNORMAL LOW (ref 30.0–36.0)
MCV: 86.3 fL (ref 80.0–100.0)
Platelets: 241 10*3/uL (ref 150–400)
RBC: 3.72 MIL/uL — ABNORMAL LOW (ref 4.22–5.81)
RDW: 17.2 % — ABNORMAL HIGH (ref 11.5–15.5)
WBC: 15 10*3/uL — ABNORMAL HIGH (ref 4.0–10.5)
nRBC: 0 % (ref 0.0–0.2)

## 2021-09-16 LAB — BASIC METABOLIC PANEL
Anion gap: 11 (ref 5–15)
BUN: 31 mg/dL — ABNORMAL HIGH (ref 8–23)
CO2: 28 mmol/L (ref 22–32)
Calcium: 8.7 mg/dL — ABNORMAL LOW (ref 8.9–10.3)
Chloride: 100 mmol/L (ref 98–111)
Creatinine, Ser: 1.71 mg/dL — ABNORMAL HIGH (ref 0.61–1.24)
GFR, Estimated: 40 mL/min — ABNORMAL LOW (ref >60)
Glucose, Bld: 127 mg/dL — ABNORMAL HIGH (ref 70–99)
Potassium: 3.7 mmol/L (ref 3.5–5.1)
Sodium: 139 mmol/L (ref 135–145)

## 2021-09-16 LAB — BRAIN NATRIURETIC PEPTIDE: B Natriuretic Peptide: 1057 pg/mL — ABNORMAL HIGH (ref 0.0–100.0)

## 2021-09-16 LAB — CBG MONITORING, ED: Glucose-Capillary: 105 mg/dL — ABNORMAL HIGH (ref 70–99)

## 2021-09-16 MED ORDER — ACETAMINOPHEN 325 MG PO TABS
650.0000 mg | ORAL_TABLET | Freq: Four times a day (QID) | ORAL | Status: DC | PRN
  Administered 2021-09-17 – 2021-09-19 (×3): 650 mg via ORAL
  Filled 2021-09-16 (×3): qty 2

## 2021-09-16 MED ORDER — FERROUS SULFATE 325 (65 FE) MG PO TABS
325.0000 mg | ORAL_TABLET | Freq: Every day | ORAL | Status: DC
  Administered 2021-09-17 – 2021-09-20 (×4): 325 mg via ORAL
  Filled 2021-09-16 (×4): qty 1

## 2021-09-16 MED ORDER — ACETAMINOPHEN 650 MG RE SUPP: 650.0000 mg | Freq: Four times a day (QID) | RECTAL | Status: DC | PRN

## 2021-09-16 MED ORDER — FUROSEMIDE 10 MG/ML IJ SOLN
40.0000 mg | Freq: Two times a day (BID) | INTRAMUSCULAR | Status: DC
  Administered 2021-09-17 – 2021-09-20 (×6): 40 mg via INTRAVENOUS
  Filled 2021-09-16 (×6): qty 4

## 2021-09-16 MED ORDER — ONDANSETRON HCL 4 MG PO TABS: 4.0000 mg | ORAL_TABLET | Freq: Four times a day (QID) | ORAL | Status: DC | PRN

## 2021-09-16 MED ORDER — FUROSEMIDE 10 MG/ML IJ SOLN
80.0000 mg | Freq: Once | INTRAMUSCULAR | Status: AC
  Administered 2021-09-16: 80 mg via INTRAVENOUS
  Filled 2021-09-16: qty 8

## 2021-09-16 MED ORDER — ONDANSETRON HCL 4 MG/2ML IJ SOLN: 4.0000 mg | Freq: Four times a day (QID) | INTRAMUSCULAR | Status: DC | PRN

## 2021-09-16 MED ORDER — CARVEDILOL 3.125 MG PO TABS
3.1250 mg | ORAL_TABLET | Freq: Two times a day (BID) | ORAL | Status: DC
  Administered 2021-09-17 – 2021-09-20 (×6): 3.125 mg via ORAL
  Filled 2021-09-16 (×5): qty 1

## 2021-09-16 MED ORDER — DILTIAZEM HCL ER COATED BEADS 120 MG PO CP24
240.0000 mg | ORAL_CAPSULE | Freq: Every day | ORAL | Status: DC
  Administered 2021-09-18 – 2021-09-19 (×2): 240 mg via ORAL
  Filled 2021-09-16: qty 1
  Filled 2021-09-16 (×2): qty 2

## 2021-09-16 MED ORDER — RAMIPRIL 10 MG PO CAPS
10.0000 mg | ORAL_CAPSULE | Freq: Every day | ORAL | Status: DC
  Administered 2021-09-18 – 2021-09-19 (×2): 10 mg via ORAL
  Filled 2021-09-16 (×4): qty 1

## 2021-09-16 MED ORDER — APIXABAN 5 MG PO TABS
5.0000 mg | ORAL_TABLET | Freq: Two times a day (BID) | ORAL | Status: DC
  Administered 2021-09-16 – 2021-09-19 (×7): 5 mg via ORAL
  Filled 2021-09-16 (×7): qty 1

## 2021-09-16 MED ORDER — INSULIN ASPART 100 UNIT/ML IJ SOLN: 0.0000 [IU] | Freq: Every day | INTRAMUSCULAR | Status: DC

## 2021-09-16 MED ORDER — INSULIN ASPART 100 UNIT/ML IJ SOLN
0.0000 [IU] | Freq: Three times a day (TID) | INTRAMUSCULAR | Status: DC
  Administered 2021-09-17: 2 [IU] via SUBCUTANEOUS
  Administered 2021-09-18 – 2021-09-19 (×2): 3 [IU] via SUBCUTANEOUS
  Administered 2021-09-19 – 2021-09-20 (×2): 2 [IU] via SUBCUTANEOUS
  Filled 2021-09-16 (×4): qty 1

## 2021-09-16 MED ORDER — ATORVASTATIN CALCIUM 20 MG PO TABS
40.0000 mg | ORAL_TABLET | Freq: Every evening | ORAL | Status: DC
  Administered 2021-09-16 – 2021-09-19 (×4): 40 mg via ORAL
  Filled 2021-09-16 (×3): qty 2

## 2021-09-16 MED ORDER — AMIODARONE HCL 200 MG PO TABS
200.0000 mg | ORAL_TABLET | Freq: Every day | ORAL | Status: DC
  Administered 2021-09-18 – 2021-09-19 (×2): 200 mg via ORAL
  Filled 2021-09-16 (×2): qty 1

## 2021-09-16 NOTE — Assessment & Plan Note (Signed)
Sliding scale insulin.  Will hold home metformin ?

## 2021-09-16 NOTE — Assessment & Plan Note (Signed)
-   Continue Eliquis 

## 2021-09-16 NOTE — Assessment & Plan Note (Addendum)
Follow-up echocardiogram to evaluate for wall motion abnormality ?Continue carvedilol ,Lisinopril. ?

## 2021-09-16 NOTE — Assessment & Plan Note (Addendum)
Patient presented with worsening shortness of breath, pedal edema, 10 pound weight gain.   ?BNP 1057 likely secondary to CHF exacerbation. ?Patient has received IV Lasix 80 mg once, Continue Lasix 40 mg IV every 12 hours. ?Continue carvedilol,  Ramipril. ?Continue daily weight, intake output charting. ?Last EF on record from 01/10/2020 was 50 to 55%. ?Repeat echo showed LVEF 45-50%. ?Patient appears euvolemic on discharge. ? ?

## 2021-09-16 NOTE — H&P (Signed)
?History and Physical  ? ? ?Patient: Randall Vincent JQB:[id number] DOB: 1940/06/26 ?DOA: 09/16/2021 ?DOS: the patient was seen and examined on 09/16/2021 ?PCP: Elspeth Cho., MD  ?Patient coming from: Home ? ?Chief Complaint:  ?Chief Complaint  ?Patient presents with  ? Chest Pain  ? Congestive Heart Failure  ? ? ?HPI: Randall Vincent is a 81 y.o. male with medical history significant for  CAD status post CABG, CHF, DM 2, HTN, A. fib status post cardioversion on 10/22/2019, on Pradaxa, recently switched to Eliquis on 12/18/2019 secondary to worsening of his CKD IIIa  ?Who presents with a several day history of shortness of breath, chest pain, lower extremity edema with weeping and weight gain of 10 pounds.  States he has been compliant with his medication.  He has increasing generalized weakness leading to a fall and was seen in the emergency room yesterday for the same.  Today he continues to feel about the same though he has not fallen again.  Decided to come in to get admitted due to increasing fluid retention and feeling of bloating. ?ED course and data review: BP 148/64, pulse 97, respirations 22 with O2 sat 89% on room air.  Afebrile ?Blood work with troponin 46 and BNP thousand and 57.  Creatinine 1.71 which is at baseline.  Elevated LFTs including total bilirubin of 1.5, ALT 49.  WBC 15,000.  Hemoglobin at baseline at 9.6.  EKG, personally viewed and interpreted with sinus rhythm at 97 with nonspecific ST-T wave changes.  Chest x-ray showing cardiomegaly with vascular congestion, pulmonary edema and small right pleural effusion.  Patient treated with 80 mg IV Lasix.  Hospitalist consulted for admission.  ? ?Review of Systems: As mentioned in the history of present illness. All other systems reviewed and are negative. ?Past Medical History:  ?Diagnosis Date  ? A-fib (HCC)   ? a.) CHA2DS2-VASc = 6 (age x 2, CHF, HTN, previous MI, T2DM). b.) Rate/rhythm maintained on amiodarone + diltiazem + carvedilol; chronically  anticoagulated using dabigatran. c.) DCCV 07/10/2015, 08/08/2019, 10/22/2019.  ? Arthritis   ? CHF (congestive heart failure) (HCC)   ? CKD (chronic kidney disease), stage III (HCC)   ? Coronary artery disease 1991  ? a.) 2v CABG in 1991. b.) PCI 06/07/2006 --> Vision stents to native LCx and RCA  ? Hyperlipidemia   ? Hypertension   ? Long term current use of anticoagulant   ? a.) dabigatran  ? Myocardial infarction (HCC) 1991  ? a.) LHC --> 50% and 90% LAD lesions; referred to CVTS. b.) ultimately underwent 2v CABG (LIMA-LAD, SVG-RCA)  ? OSA on CPAP   ? 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 a.) LIMA-LAD, SVG-RCA  ? T2DM (type 2 diabetes mellitus) (HCC)   ? ?Past Surgical History:  ?Procedure Laterality Date  ? BACK SURGERY  1970  ? L5-L6 ruptured disk  ? BLADDER SURGERY  2000  ? CARDIOVERSION N/A 07/10/2015  ? CARDIOVERSION N/A 08/08/2019  ? CARDIOVERSION N/A 10/22/2019  ? COLON SURGERY    ? COLONOSCOPY N/A 01/15/2020  ? Procedure: COLONOSCOPY;  Surgeon: Pasty Spillers, MD;  Location: [facility] ENDOSCOPY;  Service: Endoscopy;  Laterality: N/A;  ? COLONOSCOPY N/A 01/16/2020  ? Procedure: COLONOSCOPY;  Surgeon: Pasty Spillers, MD;  Location: [facility] ENDOSCOPY;  Service: Endoscopy;  Laterality: N/A;  ? CORONARY ARTERY BYPASS GRAFT N/A 01/02/1990  ? Procedure: 2v CORONARY ARTERY BYPASS GRAFT (LIMA-LAD, SVG-RCA)  ? ESOPHAGOGASTRODUODENOSCOPY N/A 01/13/2020  ? Procedure: ESOPHAGOGASTRODUODENOSCOPY (EGD);  Surgeon: Pasty Spillers,  MD;  Location: [facility] ENDOSCOPY;  Service: Endoscopy;  Laterality: N/A;  ? REPLACEMENT TOTAL KNEE BILATERAL Bilateral 2008  ? STENT PLACEMENT VASCULAR ([facility] HX)  2008  ? TOTAL KNEE REVISION Left 01/16/2020  ? Procedure: TOTAL KNEE REVISION;  Surgeon: Lyndle HerrlichBowers, James R, MD;  Location: [facility] ORS;  Service: Orthopedics;  Laterality: Left;  ? TOTAL KNEE REVISION Left 05/31/2021  ? Procedure: TOTAL KNEE REVISION;  Surgeon: Lyndle HerrlichBowers, James R, MD;  Location: [facility] ORS;  Service: Orthopedics;  Laterality: Left;   ? ?Social History:  reports that he quit smoking about 32 years ago. His smoking use included cigarettes. He has never used smokeless tobacco. He reports current alcohol use of about 1.0 standard drink per week. No history on file for drug use. ? ?No Known Allergies ? ?History reviewed. No pertinent family history. ? ?Prior to Admission medications   ?Medication Sig Start Date End Date Taking? Authorizing Provider  ?amiodarone (PACERONE) 200 MG tablet Take 200 mg by mouth daily. 10/29/19  Yes [provider]  ?atorvastatin (LIPITOR) 40 MG tablet Take 40 mg by mouth every evening.  06/18/19  Yes [provider]  ?carvedilol (COREG) 3.125 MG tablet Take 1 tablet (3.125 mg total) by mouth 2 (two) times daily with a meal. 02/09/20  Yes Wouk, Wilfred CurtisNoah Bedford, MD  ?diltiazem (CARDIZEM CD) 240 MG 24 hr capsule Take 240 mg by mouth daily. 06/21/21  Yes [provider]  ?docusate sodium (COLACE) 100 MG capsule Take 1 capsule (100 mg total) by mouth 2 (two) times daily. 06/03/21  Yes Altamese CabalJones, Maurice, PA-C  ?ferrous sulfate 325 (65 FE) MG tablet Take 325 mg by mouth daily with breakfast.   Yes [provider]  ?metFORMIN (GLUCOPHAGE-XR) 500 MG 24 hr tablet Take 500 mg by mouth 2 (two) times daily. 04/07/21  Yes [provider]  ?ramipril (ALTACE) 10 MG capsule Take 1 capsule (10 mg total) by mouth daily. 01/31/20  Yes Enedina FinnerPatel, Sona, MD  ?Semaglutide, 1 MG/DOSE, 2 MG/1.5ML SOPN Inject 1 mg into the skin every Thursday.   Yes [provider]  ?torsemide (DEMADEX) 20 MG tablet Take 1 tablet (20 mg total) by mouth 2 (two) times daily. ?Patient taking differently: Take 10-20 mg by mouth See admin instructions. Take 20 mg by mouth in the morning and 10 mg in the evening 02/09/20  Yes Wouk, Wilfred CurtisNoah Bedford, MD  ?vitamin B-12 (CYANOCOBALAMIN) 1000 MCG tablet Take 1,000 mcg by mouth daily.   Yes [provider]  ?colchicine 0.6 MG tablet Take 0.6 mg by mouth 2 (two) times daily. 09/06/21    [provider]  ?dabigatran (PRADAXA) 75 MG CAPS capsule Take 75 mg by mouth 2 (two) times daily. ?Patient not taking: Reported on 09/16/2021    [provider]  ?HYDROcodone-acetaminophen (NORCO/VICODIN) 5-325 MG tablet Take 1 tablet by mouth every 4 (four) hours as needed for severe pain. 06/03/21   Altamese CabalJones, Maurice, PA-C  ?traMADol (ULTRAM) 50 MG tablet Take 1 tablet (50 mg total) by mouth every 8 (eight) hours as needed for moderate pain or severe pain. ?Patient taking differently: Take 50 mg by mouth 4 (four) times daily as needed for moderate pain or severe pain. 01/31/20   Enedina FinnerPatel, Sona, MD  ? ? ?Physical Exam: ?Vitals:  ? 09/16/21 1725 09/16/21 1735 09/16/21 1915  ?BP:  (!) 148/64 140/67  ?Pulse:  97 94  ?Resp:  (!) 22 (!) 32  ?Temp:  98.8 ?F (37.1 ?C)   ?TempSrc:  Oral   ?SpO2:  [name](!)  89% 100%  ?Weight: 99.2 kg    ?Height: 5\' 8"  (1.727 m)    ? ?Physical Exam ?Vitals and nursing note reviewed.  ?Constitutional:   ?   General: He is not in acute distress. ?HENT:  ?   Head: Normocephalic and atraumatic.  ?Cardiovascular:  ?   Rate and Rhythm: Normal rate and regular rhythm.  ?   Pulses: Normal pulses.  ?   Heart sounds: Normal heart sounds.  ?Pulmonary:  ?   Effort: Pulmonary effort is normal. Tachypnea present.  ?   Breath sounds: Normal breath sounds.  ?Abdominal:  ?   Palpations: Abdomen is soft.  ?   Tenderness: There is no abdominal tenderness.  ?Neurological:  ?   Mental Status: Mental status is at baseline.  ? ? ? ?Data Reviewed: ?Relevant notes from primary care and specialist visits, past discharge summaries as available in EHR, including Care Everywhere. ?Prior diagnostic testing as pertinent to current admission diagnoses ?Updated medications and problem lists for reconciliation ?ED course, including vitals, labs, imaging, treatment and response to treatment ?Triage notes, nursing and pharmacy notes and ED provider's notes ?Notable results as noted in HPI ? ? ?Assessment and Plan: ?* Acute  on chronic diastolic CHF (congestive heart failure) (HCC) ?IV Lasix and continue home carvedilol and ramipril pending med rec ?Daily weights with intake and output monitoring and salt and fluid restrict

## 2021-09-16 NOTE — Assessment & Plan Note (Signed)
CPAP nightly

## 2021-09-16 NOTE — Assessment & Plan Note (Addendum)
EKG shows no ST-T wave changes. ?Suspect secondary to demand ischemia from CHF exacerbation ?Troponins remains flat. 46>46 ?Follow-up echo to evaluate for wall motion abnormality ?Cardiology consulted recommended to continue current management. ? ?

## 2021-09-16 NOTE — Assessment & Plan Note (Addendum)
Likely related to hepatic congestion from CHF ?Continue to trend ?

## 2021-09-16 NOTE — Assessment & Plan Note (Addendum)
Hb dropped from 9.6 > 7.7. ?No obvious visible bleeding noted. ?stool occult blood++ ?Monitor H&H. Transfusion threshold below 7.0 ?GI consulted recommended outpatient follow-up unless emergent.  Since patient is having acute CHF exacerbation. ?

## 2021-09-16 NOTE — Assessment & Plan Note (Addendum)
No evidence of acute bleeding at this time. ?Continue anticoagulation ?

## 2021-09-16 NOTE — Assessment & Plan Note (Addendum)
BP stable .  Continue home antihypertensives ?

## 2021-09-16 NOTE — Assessment & Plan Note (Addendum)
Heart rate well controlled and remains in NSR.   ?Continue amiodarone, carvedilol, cardizem ?Continue Eliquis ?

## 2021-09-16 NOTE — Assessment & Plan Note (Signed)
Renal function at baseline.  Monitor for worsening in view of Lasix ?

## 2021-09-16 NOTE — ED Provider Notes (Signed)
? ?[facility] ?Provider Note ? ? ? Event Date/Time  ? First MD Initiated Contact with Patient 09/16/21 1806   ?  (approximate) ? ? ?History  ? ?Chest Pain and Congestive Heart Failure ? ? ?HPI ? ?Randall Vincent is a 81 y.o. male with CHF who comes in with concerns for shortness of breath and chest pain. ? ?Patient has a history of diabetes, hypertension, CHF who was seen in the hospital yesterday oxygen level was noted to be 94% had a CT head face and neck that were all negative. ? ?Patient comes back in for concern for worsening CHF with weeping in his lower legs and shortness of breath and chest discomfort patient reports 10 pounds of weight gain.  Patient reports being compliant with his torsemide.  He states that sometimes that he has to be admitted in order to get diuresed.  He reports feeling more weak which is what he thinks contributed to his fall yesterday.  He denies any new falls since yesterday. ? ? ? ? ?Physical Exam  ? ?Triage Vital Signs: ?ED Triage Vitals  ?Enc Vitals Group  ?   BP 09/16/21 1735 (!) 148/64  ?   Pulse Rate 09/16/21 1735 97  ?   Resp 09/16/21 1735 (!) 22  ?   Temp 09/16/21 1735 98.8 ?F (37.1 ?C)  ?   Temp Source 09/16/21 1735 Oral  ?   SpO2 09/16/21 1735 (!) 89 %  ?   Weight 09/16/21 1725 218 lb 11.1 oz (99.2 kg)  ?   Height 09/16/21 1725 5\' 8"  (1.727 m)  ?   Head Circumference --   ?   Peak Flow --   ?   Pain Score 09/16/21 1726 0  ?   Pain Loc --   ?   Pain Edu? --   ?   Excl. in GC? --   ? ? ?Most recent vital signs: ?Vitals:  ? 09/16/21 1735  ?BP: (!) 148/64  ?Pulse: 97  ?Resp: (!) 22  ?Temp: 98.8 ?F (37.1 ?C)  ?SpO2: (!) 89%  ? ? ? ?General: Awake, no distress.  ?CV:  Good peripheral perfusion.  ?Resp:  Normal effort.  ?Abd:  No distention.  ?Other:  Some bruising on his face previously worked up.  A little bit of swelling in his bilateral legs. ?Mild edema noted in his legs. ? ?ED Results / Procedures / Treatments  ? ?Labs ?(all labs ordered are listed, but  only abnormal results are displayed) ?Labs Reviewed  ?RESP PANEL BY RT-PCR (FLU A&B, COVID) [id number]  ?BASIC METABOLIC PANEL  ?CBC  ?BRAIN NATRIURETIC PEPTIDE  ?HEPATIC FUNCTION PANEL  ?TROPONIN I (HIGH SENSITIVITY)  ? ? ? ?EKG ? ?My interpretation of EKG: ? ?Normal sinus rhythm 97 without any ST elevation or T wave inversions, looks like a incomplete right bundle branch block, ? ?RADIOLOGY ?I have reviewed the xray personally patient has some cardiomegaly, pulmonary edema and small right pleural effusion ? ?PROCEDURES: ? ?Critical Care performed: Yes, see critical care procedure note(s) ? ?.1-3 Lead EKG Interpretation ?Performed by: 09/18/21, MD ?Authorized by: Concha Se, MD  ? ?  Interpretation: normal   ?  ECG rate:  90 ?  ECG rate assessment: normal   ?  Rhythm: sinus rhythm   ?  Ectopy: none   ?  Conduction: normal   ?.Critical Care ?Performed by: Concha Se, MD ?Authorized by: Concha Se, MD  ? ?Critical care provider statement:  ?  Critical care time (minutes):  30 ?  Critical care was necessary to treat or prevent imminent or life-threatening deterioration of the following conditions:  Cardiac failure ?  Critical care was time spent personally by me on the following activities:  Development of treatment plan with patient or surrogate, discussions with consultants, evaluation of patient's response to treatment, examination of patient, ordering and review of laboratory studies, ordering and review of radiographic studies, ordering and performing treatments and interventions, pulse oximetry, re-evaluation of patient's condition and review of old charts ? ? ?MEDICATIONS ORDERED IN ED: ?Medications  ?furosemide (LASIX) injection 80 mg (has no administration in time range)  ? ? ? ?IMPRESSION / MDM / ASSESSMENT AND PLAN / ED COURSE  ?I reviewed the triage vital signs and the nursing notes. ? ?Patient comes in with concern for fluid overload.  EKG, cardiac marker ordered to evaluate for ACS.  Lower  suspicion for PE given patient's concerning exam.  Given patient was hypoxic patient was placed on 2 L ? ?Chest x-ray consistent with pulmonary edema ?BMP shows a slightly elevated creatinine downtrending from 3 months ago.  CBC shows stable hemoglobin.  Slightly elevated white count but denies any other infectious symptoms at this time so we will hold off on antibiotics.  Initial troponin slightly elevated but similar to prior most likely demand in nature.  BNP is elevated. ?Slight elevation of LFTs but denies any right upper quadrant discomfort. ? ?Discussed doing a dose of Lasix IV and admission.  Discussed the hospitalist for admission ? ? ?The patient is on the cardiac monitor to evaluate for evidence of arrhythmia and/or significant heart rate changes. ? ?  ? ? ?FINAL CLINICAL IMPRESSION(S) / ED DIAGNOSES  ? ?Final diagnoses:  ?Acute on chronic congestive heart failure, unspecified heart failure type (HCC)  ?Acute respiratory failure with hypoxia (HCC)  ? ? ? ?Rx / DC Orders  ? ?ED Discharge Orders   ? ? None  ? ?  ? ? ? ?Note:  This document was prepared using Dragon voice recognition software and may include unintentional dictation errors. ?  ?Concha Se, MD ?09/16/21 2000 ? ?

## 2021-09-16 NOTE — ED Triage Notes (Addendum)
Pt comes into the ED via POV c/o chest discomfort and worsening of his CHF. Pt still has weeping in the lower legs. PT c/o [facility] with the chest discomfort.  PT currently has even and unlabored respirations at this time.  Pt describes his "tightness" due to swelling in the stomach that is pushing on his lungs and chest.  Pt has gained 10lbs in the last week and he explains that he breathes better when sitting up.  ?

## 2021-09-16 NOTE — Assessment & Plan Note (Addendum)
No plans for ischemic evaluation. ?Suspect demand ischemia in the setting of CHF exacerbation ?

## 2021-09-17 ENCOUNTER — Inpatient Hospital Stay
Admit: 2021-09-17 | Discharge: 2021-09-17 | Disposition: A | Discharge status: Home / Self Care | Payer: Medicare Other | Attending: Cardiology | Admitting: Cardiology

## 2021-09-17 DIAGNOSIS — I5033 Acute on chronic diastolic (congestive) heart failure: Secondary | ICD-10-CM | POA: Diagnosis not present

## 2021-09-17 LAB — COMPREHENSIVE METABOLIC PANEL
ALT: 39 U/L (ref 0–44)
AST: 29 U/L (ref 15–41)
Albumin: 2.8 g/dL — ABNORMAL LOW (ref 3.5–5.0)
Alkaline Phosphatase: 112 U/L (ref 38–126)
Anion gap: 8 (ref 5–15)
BUN: 31 mg/dL — ABNORMAL HIGH (ref 8–23)
CO2: 28 mmol/L (ref 22–32)
Calcium: 8.2 mg/dL — ABNORMAL LOW (ref 8.9–10.3)
Chloride: 102 mmol/L (ref 98–111)
Creatinine, Ser: 1.69 mg/dL — ABNORMAL HIGH (ref 0.61–1.24)
GFR, Estimated: 41 mL/min — ABNORMAL LOW (ref >60)
Glucose, Bld: 102 mg/dL — ABNORMAL HIGH (ref 70–99)
Potassium: 3.3 mmol/L — ABNORMAL LOW (ref 3.5–5.1)
Sodium: 138 mmol/L (ref 135–145)
Total Bilirubin: 1.2 mg/dL (ref 0.3–1.2)
Total Protein: 6.4 g/dL — ABNORMAL LOW (ref 6.5–8.1)

## 2021-09-17 LAB — CBG MONITORING, ED
Glucose-Capillary: 101 mg/dL — ABNORMAL HIGH (ref 70–99)
Glucose-Capillary: 169 mg/dL — ABNORMAL HIGH (ref 70–99)

## 2021-09-17 LAB — GLUCOSE, CAPILLARY
Glucose-Capillary: 132 mg/dL — ABNORMAL HIGH (ref 70–99)
Glucose-Capillary: 148 mg/dL — ABNORMAL HIGH (ref 70–99)

## 2021-09-17 MED ORDER — PERFLUTREN LIPID MICROSPHERE
1.0000 mL | INTRAVENOUS | Status: DC | PRN
  Administered 2021-09-17: 2 mL via INTRAVENOUS
  Filled 2021-09-17: qty 10

## 2021-09-17 MED ORDER — POTASSIUM CHLORIDE 20 MEQ PO PACK
40.0000 meq | PACK | Freq: Once | ORAL | Status: AC
  Administered 2021-09-17: 40 meq via ORAL
  Filled 2021-09-17: qty 2

## 2021-09-17 NOTE — ED Notes (Signed)
Informed RN bed assigned 

## 2021-09-17 NOTE — Hospital Course (Addendum)
This 81 years old male with PMH significant for CAD s/p CABG, dCHF, DM 2, hypertension, A-fib s/p cardioversion on 10/22/2019, on Pradaxa, recently switched to Eliquis on 12/18/2019 secondary to worsening of CKD stage IIIa. Patient presents in the ED with several days history of shortness of breath,  chest pain,  lower extremity edema with weeping and weight gain of 10 pounds.  Patient states he has been very compliant with his medications.  He has increased generalized weakness leading to fall and he was seen in the emergency room yesterday for the same.  Patient continues to feel worse but has not fallen again. ?Patient is admitted for acute on chronic CHF exacerbation, elevated troponin could be in the setting of demand ischemia. Patient was given Lasix 80 mg IV once and started on Lasix 40 mg every 12 for diuresis.  Cardiology was consulted. GI was consulted for decreasing hemoglobin with the stool positive occult blood.  GI recommended outpatient follow-up since patient is having acute CHF exacerbation until emergent.  H&H remains stable.  Patient was adequately diuresed.  Patient feels much better and back to his baseline weight and breathing.  He is on goal-directed medical treatment with Coreg, ramipril, torsemide.  Echocardiogram shows reduced EF 45 to 50% patient continued on amiodarone and Eliquis for atrial fibrillation.  Patient is cleared from cardiology to be discharged.  Patient is being discharged ?

## 2021-09-17 NOTE — Progress Notes (Signed)
Admission profile updated. ?

## 2021-09-17 NOTE — Consult Note (Signed)
[name]  ? ?    ?Patient ID: ?Randall Vincent ?MRN: [id number] ?DOB/AGE: 80/09/1940 80 y.o. ? ?Admit date: 09/16/2021 ?Referring Physician Dr. Cipriano BunkerPardeep Kumar ?Primary Physician [facility], Melissa fowler, PA-C ?Primary Cardiologist Dr. Lady GaryFath ?Reason for Consultation acute on chronic HF ? ?HPI: Randall Vincent is a 80yoM with a PMH of CAD s/p CABG x2 1991 and BMS, paroxysmal AF s/p DCCV 2021 on Eliquis, HFpEF (LVEF 50-55% 2021), hypertension, type 2 diabetes, CKD3, who presented [facility] ED 09/16/21 with a several day history of shortness of breath, lower extremity weeping, a 10 pound weight gain, and generalized weakness leading to fall. Cardiology is consulted for assistance with his HF.  ? ?The patient states he tripped and fell in his yard and fell on his face on 4/26, presenting to the ED initially and negative imaging of his head and had sutures placed in the right side of his forehead and was discharged home.  He says he also scraped up his left lower leg during this fall and put an Ace bandage wrapping on it himself.  He presented back to the ED the following day for worsening shortness of breath that had actually been going on for the past 5 to 6 days and decreased appetite and worsening abdominal swelling.  He says he cut out soda entirely 2 weeks ago but since then has been drinking more water, as much as 5 x 20 ounce bottles of water (3L) per day recently.  He says he was short of breath mostly with ambulation but denies chest pain, palpitations.  He says he feels much better overnight, citing decrease in his abdominal swelling and was able to eat breakfast this morning. ? ?Blood pressure this morning 105/56 with heart rate 68.  Saturating well on 2 L. ?His labs are notable for potassium downtrending to 3.7-3.3, BUN/creatinine 31/1.67, GFR 42.  WBCs 15 H&H 9.6/32.  BNP elevated to 1057, high-sensitivity troponin minimally elevated but flat trending 46-46.  Chest x-ray with  small right and trace left pleural effusion with cardiomegaly and pulmonary edema. ? ?Review of systems complete and found to be negative unless listed above  ? ? ? ?Past Medical History:  ?Diagnosis Date  ? A-fib (HCC)   ? a.) CHA2DS2-VASc = 6 (age x 2, CHF, HTN, previous MI, T2DM). b.) Rate/rhythm maintained on amiodarone + diltiazem + carvedilol; chronically anticoagulated using dabigatran. c.) DCCV 07/10/2015, 08/08/2019, 10/22/2019.  ? Arthritis   ? CHF (congestive heart failure) (HCC)   ? CKD (chronic kidney disease), stage III (HCC)   ? Coronary artery disease 1991  ? a.) 2v CABG in 1991. b.) PCI 06/07/2006 --> Vision stents to native LCx and RCA  ? Hyperlipidemia   ? Hypertension   ? Long term current use of anticoagulant   ? a.) dabigatran  ? Myocardial infarction (HCC) 1991  ? a.) LHC --> 50% and 90% LAD lesions; referred to CVTS. b.) ultimately underwent 2v CABG (LIMA-LAD, SVG-RCA)  ? OSA on CPAP   ? 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 a.) LIMA-LAD, SVG-RCA  ? T2DM (type 2 diabetes mellitus) (HCC)   ?  ?Past Surgical History:  ?Procedure Laterality Date  ? BACK SURGERY  1970  ? L5-L6 ruptured disk  ? BLADDER SURGERY  2000  ? CARDIOVERSION N/A 07/10/2015  ? CARDIOVERSION N/A 08/08/2019  ? CARDIOVERSION N/A 10/22/2019  ? COLON SURGERY    ? COLONOSCOPY N/A 01/15/2020  ? Procedure: COLONOSCOPY;  Surgeon: Pasty Spillersahiliani, Varnita B,  MD;  Location: [facility] ENDOSCOPY;  Service: Endoscopy;  Laterality: N/A;  ? COLONOSCOPY N/A 01/16/2020  ? Procedure: COLONOSCOPY;  Surgeon: Pasty Spillers, MD;  Location: [facility] ENDOSCOPY;  Service: Endoscopy;  Laterality: N/A;  ? CORONARY ARTERY BYPASS GRAFT N/A 01/02/1990  ? Procedure: 2v CORONARY ARTERY BYPASS GRAFT (LIMA-LAD, SVG-RCA)  ? ESOPHAGOGASTRODUODENOSCOPY N/A 01/13/2020  ? Procedure: ESOPHAGOGASTRODUODENOSCOPY (EGD);  Surgeon: Pasty Spillers, MD;  Location: [facility] ENDOSCOPY;  Service: Endoscopy;  Laterality: N/A;  ? REPLACEMENT TOTAL KNEE BILATERAL Bilateral 2008  ? STENT  PLACEMENT VASCULAR ([facility] HX)  2008  ? TOTAL KNEE REVISION Left 01/16/2020  ? Procedure: TOTAL KNEE REVISION;  Surgeon: Lyndle Herrlich, MD;  Location: [facility] ORS;  Service: Orthopedics;  Laterality: Left;  ? TOTAL KNEE REVISION Left 05/31/2021  ? Procedure: TOTAL KNEE REVISION;  Surgeon: Lyndle Herrlich, MD;  Location: [facility] ORS;  Service: Orthopedics;  Laterality: Left;  ?  ?(Not in a hospital admission) ? ?Social History  ? ?Socioeconomic History  ? Marital status: Widowed  ?  Spouse name: Not on file  ? Number of children: Not on file  ? Years of education: Not on file  ? Highest education level: Not on file  ?Occupational History  ? Not on file  ?Tobacco Use  ? Smoking status: Former  ?  Types: Cigarettes  ?  Quit date: 36  ?  Years since quitting: 32.3  ? Smokeless tobacco: Never  ?Vaping Use  ? Vaping Use: Never used  ?Substance and Sexual Activity  ? Alcohol use: Yes  ?  Alcohol/week: 1.0 standard drink  ?  Types: 1 Cans of beer per week  ?  Comment: very rarely  ? Drug use: Not on file  ? Sexual activity: Not on file  ?Other Topics Concern  ? Not on file  ?Social History Narrative  ? Not on file  ? ?Social Determinants of Health  ? ?Financial Resource Strain: Not on file  ?Food Insecurity: Not on file  ?Transportation Needs: Not on file  ?Physical Activity: Not on file  ?Stress: Not on file  ?Social Connections: Not on file  ?Intimate Partner Violence: Not on file  ?  ?History reviewed. No pertinent family history.  ? ?PHYSICAL EXAM ?General: Pleasant and conversational elderly Caucasian male, well nourished, in no acute distress. ?HEENT:  Normocephalic.  ABD pad covering right side of his forehead. Exotropia of R eye.  ?Neck:  No JVD.  ?Lungs: Some conversational dyspnea on 2 L.  Decreased breath sounds with bibasilar crackles.   ?Heart: HRRR . Normal S1 and S2 without gallops or murmurs.  ?Abdomen: Soft, nontender, obese ?Msk: Normal strength and tone for age. ?Extremities: Warm and well perfused.  Scattered  ecchymosis on left upper extremity wounds covered with Dermabond from his previous ED visit 4/26.  Discoloration of bilateral lower extremities C/W likely venous stasis changes, left lower leg wrapped with Ace bandage.  1+ bilateral pitting edema. ?Neuro: Alert and oriented X 3. ?Psych:  Answers questions appropriately.  ? ?Labs: ?  ?Lab Results  ?Component Value Date  ? WBC 15.0 (H) 09/16/2021  ? HGB 9.6 (L) 09/16/2021  ? HCT 32.1 (L) 09/16/2021  ? MCV 86.3 09/16/2021  ? PLT 241 09/16/2021  ?  ?Recent Labs  ?Lab 09/17/21 ?1191  ?NA 138  ?K 3.3*  ?CL 102  ?CO2 28  ?BUN 31*  ?CREATININE 1.69*  ?CALCIUM 8.2*  ?PROT 6.4*  ?BILITOT 1.2  ?ALKPHOS 112  ?ALT 39  ?AST 29  ?  GLUCOSE 102*  ? ?No results found for: CKTOTAL, CKMB, CKMBINDEX, TROPONINI No results found for: CHOL ?No results found for: HDL ?No results found for: LDLCALC ?No results found for: TRIG ?No results found for: CHOLHDL ?No results found for: LDLDIRECT  ?  ?Radiology: CT Head Wo Contrast ? ?Result Date: 09/15/2021 ?CLINICAL DATA:  Head trauma, intracranial venous injury suspected; Facial trauma, blunt EXAM: CT HEAD WITHOUT CONTRAST CT MAXILLOFACIAL WITHOUT CONTRAST CT CERVICAL SPINE WITHOUT CONTRAST TECHNIQUE: Multidetector CT imaging of the head, cervical spine, and maxillofacial structures were performed using the standard protocol without intravenous contrast. Multiplanar CT image reconstructions of the cervical spine and maxillofacial structures were also generated. RADIATION DOSE REDUCTION: This exam was performed according to the departmental dose-optimization program which includes automated exposure control, adjustment of the mA and/or kV according to patient size and/or use of iterative reconstruction technique. COMPARISON:  None. FINDINGS: CT HEAD FINDINGS Brain: No evidence of acute infarction, hemorrhage, hydrocephalus, extra-axial collection or mass lesion/mass effect. Mild for age patchy white matter hypoattenuation, nonspecific but compatible  with chronic microvascular ischemic disease. Mild for age cerebral atrophy. Vascular: Calcific intracranial atherosclerosis. No hyperdense vessel identified Skull: High right frontal scalp laceration. No acu

## 2021-09-17 NOTE — Progress Notes (Signed)
?Progress Note ? ? ?Patient: Randall Vincent QMG:[id number] DOB: [birth year] DOA: 09/16/2021     1 ?DOS: the patient was seen and examined on 09/17/2021 ?  ?Brief hospital course: ?This 81 years old male with PMH significant for CAD s/p CABG, dCHF, DM 2, hypertension, A-fib s/p cardioversion on 10/22/2019, on Pradaxa, recently switched to Eliquis on 12/18/2019 secondary to worsening of CKD stage IIIa. Patient presents in the ED with several days history of shortness of breath,  chest pain,  lower extremity edema with weeping and weight gain of 10 pounds.  Patient states he has been very compliant with his medication.  He has increased generalized weakness leading to fall and he was seen in the emergency room yesterday for the same.  Patient continues to feel worse but has not fallen again. ?Patient is admitted for acute on chronic CHF exacerbation, elevated troponin could be in the setting of demand ischemia. Patient was given Lasix 80 mg IV once and started on Lasix 40 mg every 12 for diuresis.  Cardiology is consulted. ? ?Assessment and Plan: ?* Acute on chronic diastolic CHF (congestive heart failure) (HCC) ?Patient presented with worsening shortness of breath, pedal edema, 10 pound weight gain.   ?BNP 1057 likely secondary to CHF exacerbation. ?Patient has received IV Lasix 80 mg once, Continue Lasix 40 mg IV every 12 hours. ?Continue carvedilol, hold ramipril given worsening renal functions. ?Continue daily weight, intake output charting. ?Last EF on record from 01/10/2020 was 50 to 55%. ?Obtain 2D echocardiogram. ? ?Hyperbilirubinemia ?Likely related to hepatic congestion from CHF ?Continue to trend ? ?Elevated troponin ?No plans for ischemic evaluation. ?Suspect demand ischemia in the setting of CHF exacerbation ? ?Chest pain ?Patient reports chest discomfort and chest congestion, elevated troponin of 46  ?EKG shows no ST-T wave changes. ?Elevated troponin suspect secondary to demand ischemia from CHF  exacerbation ?Troponins remains flat. ?Follow-up echo to evaluate for wall motion abnormality ?Cardiology consulted recommended to continue current management. ? ?Chronic anticoagulation ?Continue Eliquis ? ?Atrial fibrillation status post cardioversion 10/22/19 [facility]) ?Heart rate well controlled.  Continue amiodarone, carvedilol  ?Continue Eliquis ? ?CAD S/P CABG x 2 ?Follow-up echocardiogram to evaluate for wall motion abnormality ?Continue carvedilol , hold lisinopril given worsening renal functions. ? ?History of GI bleed ?No evidence of acute bleeding at this time. ?Continue anticoagulation ? ?Chronic anemia ?Hemoglobin at baseline at 9.6.  ?Monitor H&H. ? ?OSA on CPAP ?CPAP nightly. ? ?Chronic kidney disease, stage 3a (HCC) ?Renal function at baseline.  Monitor for worsening in view of Lasix ? ?DM (diabetes mellitus), type 2 (HCC) ?Sliding scale insulin.  Will hold home metformin ? ?Essential hypertension ?BP stable .  Continue home antihypertensives ? ? ?Subjective: Patient was seen and examined at bedside.  Overnight events noted.  Patient denies any chest pain.   ?He reports overall he is doing better he has gained weight which is caused shortness of breath. ? ?Physical Exam: ?Vitals:  ? 09/17/21 0800 09/17/21 1100 09/17/21 1342 09/17/21 1345  ?BP: (!) 105/56 (!) 126/55 129/64   ?Pulse: 68 73 86   ?Resp:  20 16   ?Temp:   98.2 ?F (36.8 ?C)   ?TempSrc:      ?SpO2: 100% 98% 93%   ?Weight:    103.6 kg  ?Height:    5\' 8"  (1.727 m)  ? ?General exam: Appears comfortable, not in any acute distress.  Deconditioned.   ? Patient has significant bruising on the lower extremities and arms. ?Respiratory system: Bibasilar crackles+,  normal respiratory effort, RR 15 ?Cardiovascular system: S1-S2 heard, regular rate and rhythm, no murmur ?Gastrointestinal system: Abdomen is soft, nontender, nondistended, BS+ ?Central nervous system: Alert, oriented x 3, no focal neurological deficits. ?Extremities: Edema+, no cyanosis, no  clubbing. ?Psychiatry: Mood, insight, judgment appropriate. ?Skin: Multiple bruising noted on the both upper arms, lower extremities. ? ?Data Reviewed: ?I have Reviewed nursing notes, Vitals, and Lab results since pt's last encounter. Pertinent lab results CBC, BMP, BNP, troponins ?I have ordered test including CBC, BMP ?I have reviewed the last note from cardiologist,  ?I have discussed pt's care plan and test results with patient.  ? ?Family Communication: No family at bedside ? ?Disposition: ?Status is: Inpatient ?Remains inpatient appropriate because:  ?Admitted for acute on chronic diastolic CHF exacerbation requiring IV diuresis. ? ? ? Planned Discharge Destination: Home ? ? ? ?Time spent: 50 minutes ? ?Author: ?Cipriano Bunker, MD ?09/17/2021 2:09 PM ? ?For on call review [email or web address].  ?

## 2021-09-18 DIAGNOSIS — I5033 Acute on chronic diastolic (congestive) heart failure: Secondary | ICD-10-CM | POA: Diagnosis not present

## 2021-09-18 LAB — ECHOCARDIOGRAM COMPLETE
Area-P 1/2: 4.06 cm2
Height: 68 in
S' Lateral: 4.1 cm
Weight: 3656 oz

## 2021-09-18 LAB — HEMOGLOBIN AND HEMATOCRIT, BLOOD
HCT: 25.5 % — ABNORMAL LOW (ref 39.0–52.0)
HCT: 27.8 % — ABNORMAL LOW (ref 39.0–52.0)
Hemoglobin: 7.7 g/dL — ABNORMAL LOW (ref 13.0–17.0)
Hemoglobin: 8.3 g/dL — ABNORMAL LOW (ref 13.0–17.0)

## 2021-09-18 LAB — CBC
HCT: 26.2 % — ABNORMAL LOW (ref 39.0–52.0)
Hemoglobin: 7.9 g/dL — ABNORMAL LOW (ref 13.0–17.0)
MCH: 25.6 pg — ABNORMAL LOW (ref 26.0–34.0)
MCHC: 30.2 g/dL (ref 30.0–36.0)
MCV: 84.8 fL (ref 80.0–100.0)
Platelets: 177 10*3/uL (ref 150–400)
RBC: 3.09 MIL/uL — ABNORMAL LOW (ref 4.22–5.81)
RDW: 17.1 % — ABNORMAL HIGH (ref 11.5–15.5)
WBC: 9 10*3/uL (ref 4.0–10.5)
nRBC: 0 % (ref 0.0–0.2)

## 2021-09-18 LAB — BRAIN NATRIURETIC PEPTIDE: B Natriuretic Peptide: 1083.4 pg/mL — ABNORMAL HIGH (ref 0.0–100.0)

## 2021-09-18 LAB — GLUCOSE, CAPILLARY
Glucose-Capillary: 120 mg/dL — ABNORMAL HIGH (ref 70–99)
Glucose-Capillary: 152 mg/dL — ABNORMAL HIGH (ref 70–99)
Glucose-Capillary: 92 mg/dL (ref 70–99)
Glucose-Capillary: 137 mg/dL — ABNORMAL HIGH (ref 70–99)

## 2021-09-18 LAB — BASIC METABOLIC PANEL
Anion gap: 7 (ref 5–15)
BUN: 35 mg/dL — ABNORMAL HIGH (ref 8–23)
CO2: 28 mmol/L (ref 22–32)
Calcium: 8.2 mg/dL — ABNORMAL LOW (ref 8.9–10.3)
Chloride: 104 mmol/L (ref 98–111)
Creatinine, Ser: 1.64 mg/dL — ABNORMAL HIGH (ref 0.61–1.24)
GFR, Estimated: 42 mL/min — ABNORMAL LOW (ref >60)
Glucose, Bld: 121 mg/dL — ABNORMAL HIGH (ref 70–99)
Potassium: 3.4 mmol/L — ABNORMAL LOW (ref 3.5–5.1)
Sodium: 139 mmol/L (ref 135–145)

## 2021-09-18 LAB — TROPONIN I (HIGH SENSITIVITY)
Troponin I (High Sensitivity): 39 ng/L — ABNORMAL HIGH (ref <18)
Troponin I (High Sensitivity): 39 ng/L — ABNORMAL HIGH (ref <18)

## 2021-09-18 LAB — OCCULT BLOOD X 1 CARD TO LAB, STOOL: Fecal Occult Bld: POSITIVE — AB

## 2021-09-18 LAB — MAGNESIUM: Magnesium: 2.1 mg/dL (ref 1.7–2.4)

## 2021-09-18 LAB — PHOSPHORUS: Phosphorus: 3.4 mg/dL (ref 2.5–4.6)

## 2021-09-18 MED ORDER — POTASSIUM CHLORIDE 20 MEQ PO PACK
40.0000 meq | PACK | Freq: Once | ORAL | Status: AC
  Administered 2021-09-18: 40 meq via ORAL
  Filled 2021-09-18: qty 2

## 2021-09-18 NOTE — Plan of Care (Signed)
  Problem: Activity: Goal: Capacity to carry out activities will improve Outcome: Progressing   Problem: Cardiac: Goal: Ability to achieve and maintain adequate cardiopulmonary perfusion will improve Outcome: Progressing   

## 2021-09-18 NOTE — Progress Notes (Signed)
[facility] Cardiology ? ?SUBJECTIVE: Patient reports feeling better, denies chest pain, with improved shortness of breath ? ? ?Vitals:  ? 09/17/21 2305 09/18/21 0317 09/18/21 0352 09/18/21 0802  ?BP: 127/60 (!) 118/57  129/66  ?Pulse: 88 89  87  ?Resp: 18 18  16   ?Temp: 98.2 ?F (36.8 ?C) 98.4 ?F (36.9 ?C)  98.1 ?F (36.7 ?C)  ?TempSrc: Oral Oral    ?SpO2: 98% 98%  97%  ?Weight:   102.4 kg   ?Height:      ? ? ? ?Intake/Output Summary (Last 24 hours) at 09/18/2021 1003 ?Last data filed at 09/18/2021 0500 ?Gross per 24 hour  ?Intake 240 ml  ?Output 1150 ml  ?Net -910 ml  ? ? ? ? ?PHYSICAL EXAM ? ?General: Well developed, well nourished, in no acute distress ?HEENT:  Normocephalic and atramatic ?Neck:  No JVD.  ?Lungs: Clear bilaterally to auscultation and percussion. ?Heart: HRRR . Normal S1 and S2 without gallops or murmurs.  ?Abdomen: Bowel sounds are positive, abdomen soft and non-tender  ?Msk:  Back normal, normal gait. Normal strength and tone for age. ?Extremities: No clubbing, cyanosis or edema.   ?Neuro: Alert and oriented X 3. ?Psych:  Good affect, responds appropriately ? ? ?LABS: ?Basic Metabolic Panel: ?Recent Labs  ?  09/17/21 ?09/19/21 09/18/21 ?09/20/21  ?NA 138 139  ?K 3.3* 3.4*  ?CL 102 104  ?CO2 28 28  ?GLUCOSE 102* 121*  ?BUN 31* 35*  ?CREATININE 1.69* 1.64*  ?CALCIUM 8.2* 8.2*  ?MG  --  2.1  ?PHOS  --  3.4  ? ?Liver Function Tests: ?Recent Labs  ?  09/16/21 ?1756 09/17/21 ?09/19/21  ?AST 39 29  ?ALT 49* 39  ?ALKPHOS 133* 112  ?BILITOT 1.5* 1.2  ?PROT 7.2 6.4*  ?ALBUMIN 3.1* 2.8*  ? ?No results for input(s): LIPASE, AMYLASE in the last 72 hours. ?CBC: ?Recent Labs  ?  09/16/21 ?1756 09/18/21 ?0452 09/18/21 ?0655  ?WBC 15.0* 9.0  --   ?HGB 9.6* 7.9* 7.7*  ?HCT 32.1* 26.2* 25.5*  ?MCV 86.3 84.8  --   ?PLT 241 177  --   ? ?Cardiac Enzymes: ?No results for input(s): CKTOTAL, CKMB, CKMBINDEX, TROPONINI in the last 72 hours. ?BNP: ?Invalid input(s): POCBNP ?D-Dimer: ?No results for input(s): DDIMER in the last 72  hours. ?Hemoglobin A1C: ?No results for input(s): HGBA1C in the last 72 hours. ?Fasting Lipid Panel: ?No results for input(s): CHOL, HDL, LDLCALC, TRIG, CHOLHDL, LDLDIRECT in the last 72 hours. ?Thyroid Function Tests: ?No results for input(s): TSH, T4TOTAL, T3FREE, THYROIDAB in the last 72 hours. ? ?Invalid input(s): FREET3 ?Anemia Panel: ?No results for input(s): VITAMINB12, FOLATE, FERRITIN, TIBC, IRON, RETICCTPCT in the last 72 hours. ? ?DG Chest Port 1 View ? ?Result Date: 09/16/2021 ?CLINICAL DATA:  Chest discomfort, worsening CHF EXAM: PORTABLE CHEST 1 VIEW COMPARISON:  02/05/2020 FINDINGS: Status post median sternotomy and CABG. Cardiomegaly with vascular congestion and bilateral interstitial opacities, most likely pulmonary edema. Small right pleural effusion. Possible trace left pleural effusion. No pneumothorax. No acute osseous abnormality. IMPRESSION: Cardiomegaly, with vascular congestion, pulmonary edema, and small right pleural effusion. Electronically Signed   By: 02/07/2020 M.D.   On: 09/16/2021 19:11   ? ? ?Echo ending ? ?TELEMETRY: Sinus rhythm 89 bpm: ? ?ASSESSMENT AND PLAN: ? ?Principal Problem: ?  Acute on chronic diastolic CHF (congestive heart failure) (HCC) ?Active Problems: ?  Chest pain ?  CAD S/P CABG x 2 ?  Atrial fibrillation status post cardioversion 10/22/19 [facility]) ?  Essential hypertension ?  DM (diabetes mellitus), type 2 (HCC) ?  Chronic anemia ?  Chronic anticoagulation ?  Chronic kidney disease, stage 3a (HCC) ?  Elevated troponin ?  OSA on CPAP ?  History of GI bleed ?  Hyperbilirubinemia ?  ? ?1.  Acute on chronic diastolic congestive heart failure, HFpEF, LVEF 50 to 55% 01/11/2020, clinically improving on furosemide 40 mg IV twice daily, net output-910 cc over 24 hours, on carvedilol 3.125 mg twice daily and ramipril 10 mg daily ?2.  Mildly elevated troponin (46, 46), in the absence of chest pain, likely demand supply ischemia secondary to acute on chronic diastolic congestive  heart failure ?3.  Coronary artery disease, status post CABG x2 in 1991, with LIMA to LAD, SVG to RCA, vision BMS left circumflex and RCA 06/07/2006, currently chest pain-free ?4.  Paroxysmal atrial fibrillation, status post DCCV 2021, on Eliquis for stroke prevention, on carvedilol, Cardizem CD, and amiodarone for rate and rhythm control ? ?Recommendations ? ?1.  Agree with current therapy ?2.  Continue diuresis ?3.  Carefully monitor renal status ?4.  Defer full dose anticoagulation ?5.  Defer cardiac catheterization ?6.  Continue Eliquis for stroke prevention ?7.  Continue amiodarone for rate and rhythm control ?8.  Review 2D echocardiogram ?9.  Further recommendations pending 2D echocardiogram results ? ? ?Marcina Millard, MD, PhD, [facility] ?09/18/2021 ?10:03 AM ? ? ? ?  ?

## 2021-09-18 NOTE — Progress Notes (Addendum)
?Progress Note ? ? ?Patient: Randall Vincent ZDG:[id number] DOB: 08/12/1940 DOA: 09/16/2021     2 ? ?DOS: the patient was seen and examined on 09/18/2021 ?  ?Brief hospital course: ?This 81 years old male with PMH significant for CAD s/p CABG, dCHF, DM 2, hypertension, A-fib s/p cardioversion on 10/22/2019, on Pradaxa, recently switched to Eliquis on 12/18/2019 secondary to worsening of CKD stage IIIa. Patient presents in the ED with several days history of shortness of breath,  chest pain,  lower extremity edema with weeping and weight gain of 10 pounds.  Patient states he has been very compliant with his medication.  He has increased generalized weakness leading to fall and he was seen in the emergency room yesterday for the same.  Patient continues to feel worse but has not fallen again. ?Patient is admitted for acute on chronic CHF exacerbation, elevated troponin could be in the setting of demand ischemia. Patient was given Lasix 80 mg IV once and started on Lasix 40 mg every 12 for diuresis.  Cardiology is consulted. ? ?Assessment and Plan: ?* Acute on chronic diastolic CHF (congestive heart failure) (HCC) ?Patient presented with worsening shortness of breath, pedal edema, 10 pound weight gain.   ?BNP 1057 likely secondary to CHF exacerbation. ?Patient has received IV Lasix 80 mg once, Continue Lasix 40 mg IV every 12 hours. ?Continue carvedilol,  Ramipril. ?Continue daily weight, intake output charting. ?Last EF on record from 01/10/2020 was 50 to 55%. ?Echo done, report pending. ? ? ?Intake/Output Summary (Last 24 hours) at 09/18/2021 1228 ?Last data filed at 09/18/2021 0900 ?Gross per 24 hour  ?Intake 480 ml  ?Output 1150 ml  ?Net -670 ml  ? ? ?Hyperbilirubinemia ?Likely related to hepatic congestion from CHF ?Continue to trend ? ?Elevated troponin ?No plans for ischemic evaluation. ?Suspect demand ischemia in the setting of CHF exacerbation ? ?Chest pain ?EKG shows no ST-T wave changes. ?Suspect secondary to demand  ischemia from CHF exacerbation ?Troponins remains flat. 46>46 ?Follow-up echo to evaluate for wall motion abnormality ?Cardiology consulted recommended to continue current management. ? ?Chronic anticoagulation ?Continue Eliquis ? ?Atrial fibrillation status post cardioversion 10/22/19 [facility]) ?Heart rate well controlled and remains in NSR.   ?Continue amiodarone, carvedilol  ?Continue Eliquis ? ?CAD S/P CABG x 2 ?Follow-up echocardiogram to evaluate for wall motion abnormality ?Continue carvedilol , hold lisinopril given worsening renal functions. ? ?History of GI bleed ?No evidence of acute bleeding at this time. ?Continue anticoagulation ? ?Chronic anemia ?Hb dropped from 9.6 > 7,7. ?No obvious visible bleeding noted. ?Check stool occult blood. ?Monitor H&H. Transfusion threshold below 7.0 ? ?OSA on CPAP ?CPAP nightly. ? ?Chronic kidney disease, stage 3a (HCC) ?Renal function at baseline.  Monitor for worsening in view of Lasix ? ?DM (diabetes mellitus), type 2 (HCC) ?Sliding scale insulin.  Will hold home metformin ? ?Essential hypertension ?BP stable .  Continue home antihypertensives ? ? ?Subjective:  ?Patient was seen and examined at bedside.  Overnight events noted.   ?Patient denies any chest pain. Overall he reports feeling better, states he has been urinating a lot. ?He reports feeling better , breathing better. ? ?Physical Exam: ?Vitals:  ? 09/18/21 0317 09/18/21 0352 09/18/21 0802 09/18/21 1122  ?BP: (!) 118/57  129/66 (!) 112/55  ?Pulse: 89  87 66  ?Resp: 18  16 16   ?Temp: 98.4 ?F (36.9 ?C)  98.1 ?F (36.7 ?C) 98.4 ?F (36.9 ?C)  ?TempSrc: Oral     ?SpO2: 98%  97% 98%  ?  Weight:  102.4 kg    ?Height:      ? ?General exam: Appears comfortable, not in any acute distress.  Deconditioned ?Significant bruising noted on lower extremities and arms. ?Respiratory system: CTA bilaterally, no wheezing, no crackles,  normal respiratory effort, RR 16 ?Cardiovascular system: S1-S2 heard, regular rate and rhythm, no  murmur ?Gastrointestinal system: Abdomen is soft, non tender, non distended, BS+ ?Central nervous system: Alert, oriented x 3, no focal neurological deficits. ?Extremities: Edema+, no cyanosis, no clubbing. ?Psychiatry: Mood, insight, judgment appropriate. ?Skin: Multiple bruising noted on the both upper arms, lower extremities. ? ?Data Reviewed: ?I have Reviewed nursing notes, Vitals, and Lab results since pt's last encounter. Pertinent lab results CBC, BMP, BNP ?I have ordered test including CBC, BMP, BNP, Trope ?I have reviewed the last note from cardiology,  ?I have discussed pt's care plan and test results with patient.  ? ?Family Communication: No family at bedside ? ? ?Disposition: ?Status is: Inpatient ?Remains inpatient appropriate because:  ?Admitted for acute on chronic diastolic CHF exacerbation requiring IV diuresis. ? ? ? Planned Discharge Destination: Home ? ? ? ? ?Time spent: 35 minutes ? ?Author: ?Cipriano Bunker, MD ?09/18/2021 12:40 PM ? ?For on call review [email or web address].  ?

## 2021-09-18 NOTE — Progress Notes (Signed)
Xeroform and foam changed to right elbow abrasion per pt request. Pt tolerated well.  ?

## 2021-09-19 DIAGNOSIS — I5033 Acute on chronic diastolic (congestive) heart failure: Secondary | ICD-10-CM | POA: Diagnosis not present

## 2021-09-19 LAB — CBC
HCT: 24.9 % — ABNORMAL LOW (ref 39.0–52.0)
Hemoglobin: 7.5 g/dL — ABNORMAL LOW (ref 13.0–17.0)
MCH: 25.6 pg — ABNORMAL LOW (ref 26.0–34.0)
MCHC: 30.1 g/dL (ref 30.0–36.0)
MCV: 85 fL (ref 80.0–100.0)
Platelets: 199 10*3/uL (ref 150–400)
RBC: 2.93 MIL/uL — ABNORMAL LOW (ref 4.22–5.81)
RDW: 16.9 % — ABNORMAL HIGH (ref 11.5–15.5)
WBC: 10 10*3/uL (ref 4.0–10.5)
nRBC: 0 % (ref 0.0–0.2)

## 2021-09-19 LAB — BASIC METABOLIC PANEL
Anion gap: 6 (ref 5–15)
BUN: 39 mg/dL — ABNORMAL HIGH (ref 8–23)
CO2: 26 mmol/L (ref 22–32)
Calcium: 7.8 mg/dL — ABNORMAL LOW (ref 8.9–10.3)
Chloride: 104 mmol/L (ref 98–111)
Creatinine, Ser: 1.74 mg/dL — ABNORMAL HIGH (ref 0.61–1.24)
GFR, Estimated: 39 mL/min — ABNORMAL LOW (ref >60)
Glucose, Bld: 118 mg/dL — ABNORMAL HIGH (ref 70–99)
Potassium: 3.6 mmol/L (ref 3.5–5.1)
Sodium: 136 mmol/L (ref 135–145)

## 2021-09-19 LAB — GLUCOSE, CAPILLARY
Glucose-Capillary: 112 mg/dL — ABNORMAL HIGH (ref 70–99)
Glucose-Capillary: 136 mg/dL — ABNORMAL HIGH (ref 70–99)
Glucose-Capillary: 159 mg/dL — ABNORMAL HIGH (ref 70–99)
Glucose-Capillary: 117 mg/dL — ABNORMAL HIGH (ref 70–99)

## 2021-09-19 NOTE — Plan of Care (Signed)

## 2021-09-19 NOTE — Progress Notes (Signed)
?Progress Note ? ? ?Patient: Randall Vincent DVV:[id number] DOB: 16-May-1941 DOA: 09/16/2021     3 ? ?DOS: the patient was seen and examined on 09/19/2021 ?  ?Brief hospital course: ?This 81 years old male with PMH significant for CAD s/p CABG, dCHF, DM 2, hypertension, A-fib s/p cardioversion on 10/22/2019, on Pradaxa, recently switched to Eliquis on 12/18/2019 secondary to worsening of CKD stage IIIa. Patient presents in the ED with several days history of shortness of breath,  chest pain,  lower extremity edema with weeping and weight gain of 10 pounds.  Patient states he has been very compliant with his medication.  He has increased generalized weakness leading to fall and he was seen in the emergency room yesterday for the same.  Patient continues to feel worse but has not fallen again. ?Patient is admitted for acute on chronic CHF exacerbation, elevated troponin could be in the setting of demand ischemia. Patient was given Lasix 80 mg IV once and started on Lasix 40 mg every 12 for diuresis.  Cardiology is consulted. GI is consulted for decreasing hemoglobin with the stool positive occult blood.  GI recommended outpatient follow-up since patient is having acute CHF exacerbation until emergent. ? ?Assessment and Plan: ?* Acute on chronic diastolic CHF (congestive heart failure) (HCC) ?Patient presented with worsening shortness of breath, pedal edema, 10 pound weight gain.   ?BNP 1057 likely secondary to CHF exacerbation. ?Patient has received IV Lasix 80 mg once, Continue Lasix 40 mg IV every 12 hours. ?Continue carvedilol,  Ramipril. ?Continue daily weight, intake output charting. ?Last EF on record from 01/10/2020 was 50 to 55%. ?Repeat echo showed LVEF 45-50% ? ? ?Intake/Output Summary (Last 24 hours) at 09/19/2021 1223 ?Last data filed at 09/18/2021 1800 ?Gross per 24 hour  ?Intake 480 ml  ?Output --  ?Net 480 ml  ? ? ?Hyperbilirubinemia ?Likely related to hepatic congestion from CHF ?Continue to trend ? ?Elevated  troponin ?No plans for ischemic evaluation. ?Suspect demand ischemia in the setting of CHF exacerbation ? ?Chest pain ?EKG shows no ST-T wave changes. ?Suspect secondary to demand ischemia from CHF exacerbation ?Troponins remains flat. 46>46 ?Follow-up echo to evaluate for wall motion abnormality ?Cardiology consulted recommended to continue current management. ? ?Chronic anticoagulation ?Continue Eliquis ? ?Atrial fibrillation status post cardioversion 10/22/19 [facility]) ?Heart rate well controlled and remains in NSR.   ?Continue amiodarone, carvedilol, cardizem ?Continue Eliquis ? ?CAD S/P CABG x 2 ?Follow-up echocardiogram to evaluate for wall motion abnormality ?Continue carvedilol ,Lisinopril. ? ?History of GI bleed ?No evidence of acute bleeding at this time. ?Continue anticoagulation ? ?Chronic anemia ?Hb dropped from 9.6 > 7.7. ?No obvious visible bleeding noted. ?stool occult blood++ ?Monitor H&H. Transfusion threshold below 7.0 ?GI consulted recommended outpatient follow-up unless emergent.  Since patient is having acute CHF exacerbation. ? ?OSA on CPAP ?CPAP nightly. ? ?Chronic kidney disease, stage 3a (HCC) ?Renal function at baseline.  Monitor for worsening in view of Lasix ? ?DM (diabetes mellitus), type 2 (HCC) ?Sliding scale insulin.  Will hold home metformin ? ?Essential hypertension ?BP stable .  Continue home antihypertensives ? ? ?Subjective:  ?Patient was seen and examined at bedside.  Overnight events noted.   ?Overall patient reports feeling better , states breathing has improved after he has given Lasix. ?He denies any chest pain or shortness of breath. ? ?Physical Exam: ?Vitals:  ? 09/18/21 1841 09/19/21 0007 09/19/21 6269 09/19/21 1157  ?BP: (!) 132/59 (!) 142/62 139/76 (!) 112/50  ?Pulse: 77 78 88  64  ?Resp: 16  16 16   ?Temp: 98.1 ?F (36.7 ?C) 98.3 ?F (36.8 ?C) 97.9 ?F (36.6 ?C) 98 ?F (36.7 ?C)  ?TempSrc:  Oral    ?SpO2: 94% 91% 95% 96%  ?Weight:      ?Height:      ? ?General exam: Appears  comfortable, not in any acute distress.  Deconditioned. ?Significant bruising noted on lower extremities and arms. ?Respiratory system: CTA bilaterally, no wheezing, no crackles, normal respiratory effort. ?Cardiovascular system: S1-S2 heard, regular rate and rhythm, no murmur ?Gastrointestinal system: Abdomen is soft, non tender, non distended, BS+ ?Central nervous system: Alert, oriented x 3, no focal neurological deficits. ?Extremities: Edema+, no cyanosis, no clubbing. ?Psychiatry: Mood, insight, judgment appropriate. ?Skin: Multiple bruising noted on the both upper arms, lower extremities. ? ?Data Reviewed: ?I have Reviewed nursing notes, Vitals, and Lab results since pt's last encounter. Pertinent lab results CBC, BMP ?I have ordered test including CBC, BMP ?I have reviewed the last note from cardiologist, gastroenterologist,  ?I have discussed pt's care plan and test results with patient.  ? ?Family Communication: No family at bedside ? ? ?Disposition: ?Status is: Inpatient ?Remains inpatient appropriate because:  ?Admitted for acute on chronic diastolic CHF exacerbation requiring IV diuresis.  Hemoglobin has dropped requiring GI evaluation.  No plan for any inpatient GI work-up. ? ? ? Planned Discharge Destination: Home ? ? ? ? ?Time spent: 35 minutes ? ?Author: ? , MD ?09/19/2021 12:30 PM ? ?For on call review [email or web address].  ?

## 2021-09-19 NOTE — Consult Note (Signed)
Consultation ? ?Referring Provider:    Hospitalist ?Admit date: 4/27 ?Consult date: 4/30         ?Reason for Consultation:     Anemia ?       ? HPI:   ?Randall Vincent is a 81 y.o. gentleman with HFpEF, CAD, a. Fib on DOAC, DM II, and sleep apnea who was admitted with acute heart failure exacerbation and is currently undergoing IV diuresis. GI has been consulted for anemia with no overt GI bleeding. Patient underwent several endoscopic procedures in 2021 for melena which were unremarkable. His baseline hemoglobin is 8-9 which he currently is not far off from. Endorses some dyspepsia but no overt GI bleeding. ? ? ?Past Medical History:  ?Diagnosis Date  ? A-fib (HCC)   ? a.) CHA2DS2-VASc = 6 (age x 2, CHF, HTN, previous MI, T2DM). b.) Rate/rhythm maintained on amiodarone + diltiazem + carvedilol; chronically anticoagulated using dabigatran. c.) DCCV 07/10/2015, 08/08/2019, 10/22/2019.  ? Arthritis   ? CHF (congestive heart failure) (HCC)   ? CKD (chronic kidney disease), stage III (HCC)   ? Coronary artery disease 1991  ? a.) 2v CABG in 1991. b.) PCI 06/07/2006 --> Vision stents to native LCx and RCA  ? Hyperlipidemia   ? Hypertension   ? Long term current use of anticoagulant   ? a.) dabigatran  ? Myocardial infarction (HCC) 1991  ? a.) LHC --> 50% and 90% LAD lesions; referred to CVTS. b.) ultimately underwent 2v CABG (LIMA-LAD, SVG-RCA)  ? OSA on CPAP   ? 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 a.) LIMA-LAD, SVG-RCA  ? T2DM (type 2 diabetes mellitus) (HCC)   ? ? ?Past Surgical History:  ?Procedure Laterality Date  ? BACK SURGERY  1970  ? L5-L6 ruptured disk  ? BLADDER SURGERY  2000  ? CARDIOVERSION N/A 07/10/2015  ? CARDIOVERSION N/A 08/08/2019  ? CARDIOVERSION N/A 10/22/2019  ? COLON SURGERY    ? COLONOSCOPY N/A 01/15/2020  ? Procedure: COLONOSCOPY;  Surgeon: Pasty Spillersahiliani, Varnita B, MD;  [facility];  Service: Endoscopy;  Laterality: N/A;  ? COLONOSCOPY N/A 01/16/2020  ? Procedure: COLONOSCOPY;  Surgeon: Pasty Spillersahiliani,  Varnita B, MD;  [facility];  Service: Endoscopy;  Laterality: N/A;  ? CORONARY ARTERY BYPASS GRAFT N/A 01/02/1990  ? Procedure: 2v CORONARY ARTERY BYPASS GRAFT (LIMA-LAD, SVG-RCA)  ? ESOPHAGOGASTRODUODENOSCOPY N/A 01/13/2020  ? Procedure: ESOPHAGOGASTRODUODENOSCOPY (EGD);  Surgeon: Pasty Spillersahiliani, Varnita B, MD;  [facility];  Service: Endoscopy;  Laterality: N/A;  ? REPLACEMENT TOTAL KNEE BILATERAL Bilateral 2008  ? STENT PLACEMENT VASCULAR ([facility] HX)  2008  ? TOTAL KNEE REVISION Left 01/16/2020  ? Procedure: TOTAL KNEE REVISION;  Surgeon: Lyndle HerrlichBowers, James R, MD;  Location: [facility] ORS;  Service: Orthopedics;  Laterality: Left;  ? TOTAL KNEE REVISION Left 05/31/2021  ? Procedure: TOTAL KNEE REVISION;  Surgeon: Lyndle HerrlichBowers, James R, MD;  Location: [facility] ORS;  Service: Orthopedics;  Laterality: Left;  ? ? ?History reviewed. No pertinent family history.  ? ?Social History  ? ?Tobacco Use  ? Smoking status: Former  ?  Types: Cigarettes  ?  Quit date: 751991  ?  Years since quitting: 32.3  ? Smokeless tobacco: Never  ?Vaping Use  ? Vaping Use: Never used  ?Substance Use Topics  ? Alcohol use: Yes  ?  Alcohol/week: 1.0 standard drink  ?  Types: 1 Cans of beer per week  ?  Comment: very rarely  ? ? ?Prior to Admission medications   ?Medication Sig Start Date  End Date Taking? Authorizing Provider  ?amiodarone (PACERONE) 200 MG tablet Take 200 mg by mouth daily. 10/29/19  Yes [provider]  ?atorvastatin (LIPITOR) 40 MG tablet Take 40 mg by mouth every evening.  06/18/19  Yes [provider]  ?carvedilol (COREG) 3.125 MG tablet Take 1 tablet (3.125 mg total) by mouth 2 (two) times daily with a meal. 02/09/20  Yes Wouk, Wilfred Curtis, MD  ?diltiazem (CARDIZEM CD) 240 MG 24 hr capsule Take 240 mg by mouth daily. 06/21/21  Yes [provider]  ?docusate sodium (COLACE) 100 MG capsule Take 1 capsule (100 mg total) by mouth 2 (two) times daily. 06/03/21  Yes Altamese Cabal, PA-C  ?ferrous sulfate 325  (65 FE) MG tablet Take 325 mg by mouth daily with breakfast.   Yes [provider]  ?metFORMIN (GLUCOPHAGE-XR) 500 MG 24 hr tablet Take 500 mg by mouth 2 (two) times daily. 04/07/21  Yes [provider]  ?ramipril (ALTACE) 10 MG capsule Take 1 capsule (10 mg total) by mouth daily. 01/31/20  Yes Enedina Finner, MD  ?Semaglutide, 1 MG/DOSE, 2 MG/1.5ML SOPN Inject 1 mg into the skin every Thursday.   Yes [provider]  ?torsemide (DEMADEX) 20 MG tablet Take 1 tablet (20 mg total) by mouth 2 (two) times daily. ?Patient taking differently: Take 10-20 mg by mouth See admin instructions. Take 20 mg by mouth in the morning and 10 mg in the evening 02/09/20  Yes Wouk, Wilfred Curtis, MD  ?vitamin B-12 (CYANOCOBALAMIN) 1000 MCG tablet Take 1,000 mcg by mouth daily.   Yes [provider]  ?colchicine 0.6 MG tablet Take 0.6 mg by mouth 2 (two) times daily. 09/06/21   [provider]  ?HYDROcodone-acetaminophen (NORCO/VICODIN) 5-325 MG tablet Take 1 tablet by mouth every 4 (four) hours as needed for severe pain. 06/03/21   Altamese Cabal, PA-C  ?traMADol (ULTRAM) 50 MG tablet Take 1 tablet (50 mg total) by mouth every 8 (eight) hours as needed for moderate pain or severe pain. ?Patient taking differently: Take 50 mg by mouth 4 (four) times daily as needed for moderate pain or severe pain. 01/31/20   Enedina Finner, MD  ? ? ?Current Facility-Administered Medications  ?Medication Dose Route Frequency Provider Last Rate Last Admin  ? acetaminophen (TYLENOL) tablet 650 mg  650 mg Oral Q6H PRN Andris Baumann, MD   650 mg at 09/19/21 0158  ? Or  ? acetaminophen (TYLENOL) suppository 650 mg  650 mg Rectal Q6H PRN Andris Baumann, MD      ? amiodarone (PACERONE) tablet 200 mg  200 mg Oral Daily Andris Baumann, MD   200 mg at 09/19/21 0846  ? apixaban (ELIQUIS) tablet 5 mg  5 mg Oral BID Andris Baumann, MD   5 mg at 09/19/21 0844  ? atorvastatin (LIPITOR) tablet 40 mg  40 mg Oral QPM Lindajo Royal V,  MD   40 mg at 09/18/21 1722  ? carvedilol (COREG) tablet 3.125 mg  3.125 mg Oral BID WC Andris Baumann, MD   3.125 mg at 09/19/21 0846  ? diltiazem (CARDIZEM CD) 24 hr capsule 240 mg  240 mg Oral Daily Andris Baumann, MD   240 mg at 09/19/21 0845  ? ferrous sulfate tablet 325 mg  325 mg Oral Q breakfast Andris Baumann, MD   325 mg at 09/19/21 1771  ? furosemide (LASIX) injection 40 mg  40 mg Intravenous BID Andris Baumann, MD   40 mg  at 09/19/21 0844  ? insulin aspart (novoLOG) injection 0-15 Units  0-15 Units Subcutaneous TID WC Andris Baumann, MD   3 Units at 09/18/21 1300  ? insulin aspart (novoLOG) injection 0-5 Units  0-5 Units Subcutaneous QHS Andris Baumann, MD      ? ondansetron [facility]) tablet 4 mg  4 mg Oral Q6H PRN Andris Baumann, MD      ? Or  ? ondansetron [facility]) injection 4 mg  4 mg Intravenous Q6H PRN Andris Baumann, MD      ? ramipril (ALTACE) capsule 10 mg  10 mg Oral Daily Andris Baumann, MD   10 mg at 09/19/21 0844  ? ? ?Allergies as of 09/16/2021  ? (No Known Allergies)  ? ? ? ?Review of Systems:    ?All systems reviewed and negative except where noted in HPI. ? ?Review of Systems  ?Constitutional:  Negative for chills.  ?Respiratory:  Positive for shortness of breath.   ?Gastrointestinal:  Positive for heartburn. Negative for abdominal pain, blood in stool, constipation, diarrhea, melena, nausea and vomiting.  ?Genitourinary:  Positive for frequency.  ?Musculoskeletal:  Positive for joint pain.  ?Skin:  Positive for rash.  ?Neurological:  Negative for focal weakness.  ?Psychiatric/Behavioral:  Negative for substance abuse.   ?All other systems reviewed and are negative.  ? ? ? Physical Exam:  ?Vital signs in last 24 hours: ?Temp:  [97.9 ?F (36.6 ?C)-98.4 ?F (36.9 ?C)] 97.9 ?F (36.6 ?C) (04/30 1660) ?Pulse Rate:  [64-88] 88 (04/30 0814) ?Resp:  [16] 16 (04/30 6301) ?BP: (112-142)/(46-76) 139/76 (04/30 6010) ?SpO2:  [91 %-98 %] 95 % (04/30 0814) ?Last BM Date : 09/16/21 ?General:    Pleasant in NAD ?Head:  Normocephalic and atraumatic. ?Eyes:   No icterus.   Conjunctiva pink. ?Mouth: Mucosa pink moist, no lesions. ?Neck:  Supple; no masses felt ?Lungs:  No respiratory distress ?[place]

## 2021-09-20 ENCOUNTER — Encounter: Payer: Self-pay | Admitting: Internal Medicine

## 2021-09-20 DIAGNOSIS — I5033 Acute on chronic diastolic (congestive) heart failure: Secondary | ICD-10-CM | POA: Diagnosis not present

## 2021-09-20 LAB — CBC
HCT: 26.5 % — ABNORMAL LOW (ref 39.0–52.0)
Hemoglobin: 8.4 g/dL — ABNORMAL LOW (ref 13.0–17.0)
MCH: 27 pg (ref 26.0–34.0)
MCHC: 31.7 g/dL (ref 30.0–36.0)
MCV: 85.2 fL (ref 80.0–100.0)
Platelets: 214 10*3/uL (ref 150–400)
RBC: 3.11 MIL/uL — ABNORMAL LOW (ref 4.22–5.81)
RDW: 16.9 % — ABNORMAL HIGH (ref 11.5–15.5)
WBC: 8.6 10*3/uL (ref 4.0–10.5)
nRBC: 0 % (ref 0.0–0.2)

## 2021-09-20 LAB — BASIC METABOLIC PANEL
Anion gap: 7 (ref 5–15)
BUN: 38 mg/dL — ABNORMAL HIGH (ref 8–23)
CO2: 28 mmol/L (ref 22–32)
Calcium: 7.8 mg/dL — ABNORMAL LOW (ref 8.9–10.3)
Chloride: 104 mmol/L (ref 98–111)
Creatinine, Ser: 1.67 mg/dL — ABNORMAL HIGH (ref 0.61–1.24)
GFR, Estimated: 41 mL/min — ABNORMAL LOW (ref >60)
Glucose, Bld: 126 mg/dL — ABNORMAL HIGH (ref 70–99)
Potassium: 3.5 mmol/L (ref 3.5–5.1)
Sodium: 139 mmol/L (ref 135–145)

## 2021-09-20 LAB — FOLATE: Folate: 7.7 ng/mL (ref >5.9)

## 2021-09-20 LAB — IRON AND TIBC
Iron: 17 ug/dL — ABNORMAL LOW (ref 45–182)
Saturation Ratios: 8 % — ABNORMAL LOW (ref 17.9–39.5)
TIBC: 225 ug/dL — ABNORMAL LOW (ref 250–450)
UIBC: 208 ug/dL

## 2021-09-20 LAB — VITAMIN B12: Vitamin B-12: 466 pg/mL (ref 180–914)

## 2021-09-20 LAB — FERRITIN: Ferritin: 76 ng/mL (ref 24–336)

## 2021-09-20 LAB — GLUCOSE, CAPILLARY: Glucose-Capillary: 121 mg/dL — ABNORMAL HIGH (ref 70–99)

## 2021-09-20 LAB — MAGNESIUM: Magnesium: 2.2 mg/dL (ref 1.7–2.4)

## 2021-09-20 LAB — PHOSPHORUS: Phosphorus: 4.1 mg/dL (ref 2.5–4.6)

## 2021-09-20 MED ORDER — APIXABAN 5 MG PO TABS: 5.0000 mg | ORAL_TABLET | Freq: Two times a day (BID) | ORAL | 3 refills | Status: DC

## 2021-09-20 NOTE — Evaluation (Signed)
Occupational Therapy Evaluation ?Patient Details ?Name: Randall Vincent ?MRN: [id number] ?DOB: Mar 25, 1941 ?Today's Date: 09/20/2021 ? ? ?History of Present Illness 81 years old male with PMH significant for CAD s/p CABG, dCHF, DM 2, hypertension, A-fib s/p cardioversion on 10/22/2019, on Pradaxa, recently switched to Eliquis on 12/18/2019 secondary to worsening of CKD stage IIIa. Patient presents in the ED with several days history of shortness of breath,  chest pain,  lower extremity edema with weeping and weight gain of 10 pounds.  Patient states he has been very compliant with his medication.  He has increased generalized weakness leading to fall and he was seen in the emergency room yesterday for the same.  Patient continues to feel worse but has not fallen again.  Patient is admitted for acute on chronic CHF exacerbation, elevated troponin could be in the setting of demand ischemia  ? ?Clinical Impression ?  ?Upon entering the room, pt in bathroom on commode but agreeable to OT intervention. Pt reports living in camper outside of son's home. Family works during the day. Pt endorses furniture ambulation in camper secondary to small space. Occasional use of SPC in community. Pt recently got rollator but has not used. Pt does not have any other equipment at home. He demonstrates self care tasks, standing at sink for hand hygiene, and ambulation within room with supervision while furniture walking (similar to home). Pt ambulating 200' with RW in hallway with supervision. OT recommending use of rollator in community to increase safety. All vitals WFLs during session. Pt does not need further skilled OT intervention at this time. OT to SIGN OFF and pt is agreeable.  ?   ? ?Recommendations for follow up therapy are one component of a multi-disciplinary discharge planning process, led by the attending physician.  Recommendations may be updated based on patient status, additional functional criteria and insurance authorization.   ? ?Follow Up Recommendations ? No OT follow up  ?  ?Assistance Recommended at Discharge PRN  ?Patient can return home with the following Help with stairs or ramp for entrance;Assist for transportation;Assistance with cooking/housework ? ?  ?Functional Status Assessment ? Patient has had a recent decline in their functional status and demonstrates the ability to make significant improvements in function in a reasonable and predictable amount of time.  ?Equipment Recommendations ? None recommended by OT  ?  ?   ?Precautions / Restrictions Precautions ?Precautions: Fall ?Restrictions ?Weight Bearing Restrictions: No  ? ?  ? ?Mobility Bed Mobility ?  ?  ?  ?  ?  ?  ?  ?  ?  ? ?Transfers ?Overall transfer level: Needs assistance ?  ?Transfers: Sit to/from Stand, Bed to chair/wheelchair/BSC ?Sit to Stand: Supervision ?  ?  ?Step pivot transfers: Supervision ?  ?  ?  ?  ? ?  ?Balance Overall balance assessment: Needs assistance ?Sitting-balance support: Feet supported, No upper extremity supported ?Sitting balance-Leahy Scale: Good ?  ?  ?Standing balance support: During functional activity, Reliant on assistive device for balance ?Standing balance-Leahy Scale: Fair ?  ?  ?  ?  ?  ?  ?  ?  ?  ?  ?  ?  ?   ? ?ADL either performed or assessed with clinical judgement  ? ?ADL   ?  ?  ?  ?  ?  ?  ?  ?  ?  ?  ?  ?  ?  ?  ?  ?  ?  ?  ?  ?General  ADL Comments: supervision overall for toileting, clothing management, and standing for hand hygiene.  ? ? ? ?Vision Patient Visual Report: No change from baseline ?   ?   ?   ?   ? ?Pertinent Vitals/Pain Pain Assessment ?Pain Assessment: No/denies pain  ? ? ? ?Hand Dominance Right ?  ?Extremity/Trunk Assessment Upper Extremity Assessment ?Upper Extremity Assessment: Overall WFL for tasks assessed ?  ?Lower Extremity Assessment ?Lower Extremity Assessment: Overall WFL for tasks assessed;Generalized weakness ?  ?  ?  ?Communication Communication ?Communication: No difficulties ?   ?Cognition Arousal/Alertness: Awake/alert ?Behavior During Therapy: [facility] for tasks assessed/performed ?Overall Cognitive Status: Within Functional Limits for tasks assessed ?  ?  ?  ?  ?  ?  ?  ?  ?  ?  ?  ?  ?  ?  ?  ?  ?  ?  ?  ?   ?   ?   ? ? ?Home Living Family/patient expects to be discharged to:: Private residence ?Living Arrangements: Alone ?Available Help at Discharge: Family;Available 24 hours/day ?Type of Home: Other(Comment) (camper) ?Home Access: Stairs to enter ?Entrance Stairs-Number of Steps: 5 ?Entrance Stairs-Rails: Left ?Home Layout: One level ?  ?  ?Bathroom Shower/Tub: Tub/shower unit ?  ?Bathroom Toilet: Handicapped height ?  ?  ?Home Equipment: Cane - single point;Rollator (4 wheels) ?  ?  ?  ? ?  ?Prior Functioning/Environment Prior Level of Function : Independent/Modified Independent;Driving;History of Falls (last six months) ?  ?  ?  ?  ?  ?  ?Mobility Comments: community ambulator who is living in a camper next to son's home. He furniture walks in camper and uses SPC in community. 5 falls in last 6 months. ?ADLs Comments: Pt reports independence. He drives and does his own grocery shopping. Goes to son's home for some meals. ?  ? ?  ?  ?   ?   ?   ?OT Goals(Current goals can be found in the care plan section) Acute Rehab OT Goals ?Patient Stated Goal: to return home ?OT Goal Formulation: With patient/family ?Time For Goal Achievement: 09/20/21 ?Potential to Achieve Goals: Good  ?OT Frequency:   ?  ? ?   ?AM-PAC OT "6 Clicks" Daily Activity     ?Outcome Measure Help from another person eating meals?: None ?Help from another person taking care of personal grooming?: None ?Help from another person toileting, which includes using toliet, bedpan, or urinal?: None ?Help from another person bathing (including washing, rinsing, drying)?: None ?Help from another person to put on and taking off regular upper body clothing?: None ?Help from another person to put on and taking off regular lower body  clothing?: None ?6 Click Score: 24 ?  ?End of Session Equipment Utilized During Treatment: Rolling walker (2 wheels) ?Nurse Communication: Mobility status ? ?Activity Tolerance: Patient tolerated treatment well ?Patient left: in chair;with call bell/phone within reach;with nursing/sitter in room ? ?   ?              ?Time: 1601-0932 ?OT Time Calculation (min): 20 min ?Charges:  OT General Charges ?$OT Visit: 1 Visit ?OT Evaluation ?$OT Eval Low Complexity: 1 Low ?OT Treatments ?$Self Care/Home Management : 8-22 mins ? ?Jackquline Denmark, MS, OTR/L , CBIS ?ascom [phone]  ?09/20/21, 12:46 PM  ?

## 2021-09-20 NOTE — Discharge Summary (Addendum)
?Physician Discharge Summary ?  ?Patient: Randall Vincent MRN: [id number] DOB: 08-12-1940  ?Admit date:     09/16/2021  ?Discharge date: 09/20/21  ?Discharge Physician: Cipriano Bunker  ? ?PCP: Elspeth Cho., MD  ? ?Recommendations at discharge:  ?Advised to follow-up with primary care physician in 1 week. ?Advised to follow-up with Dr. Lady Gary in 2 weeks. ?Advised to take amiodarone 200 mg daily, Eliquis 5 mg twice daily, torsemide 20 mg in the morning and 10 mg in the night. ? ?Discharge Diagnoses: ?Principal Problem: ?  Acute on chronic diastolic CHF (congestive heart failure) (HCC) ?Active Problems: ?  Elevated troponin ?  Hyperbilirubinemia ?  CAD S/P CABG x 2 ?  Atrial fibrillation status post cardioversion 10/22/19 [facility]) ?  Chronic anticoagulation ?  Chronic anemia ?  History of GI bleed ?  Essential hypertension ?  DM (diabetes mellitus), type 2 (HCC) ?  Chronic kidney disease, stage 3a (HCC) ?  OSA on CPAP ? ?Resolved Problems: ?  Chest pain ? ?Hospital Course: ?This 81 years old male with PMH significant for CAD s/p CABG, dCHF, DM 2, hypertension, A-fib s/p cardioversion on 10/22/2019, on Pradaxa, recently switched to Eliquis on 12/18/2019 secondary to worsening of CKD stage IIIa. Patient presents in the ED with several days history of shortness of breath,  chest pain,  lower extremity edema with weeping and weight gain of 10 pounds.  Patient states he has been very compliant with his medications.  He has increased generalized weakness leading to fall and he was seen in the emergency room yesterday for the same.  Patient continues to feel worse but has not fallen again. ?Patient is admitted for acute on chronic CHF exacerbation, elevated troponin could be in the setting of demand ischemia. Patient was given Lasix 80 mg IV once and started on Lasix 40 mg every 12 for diuresis.  Cardiology was consulted. GI was consulted for decreasing hemoglobin with the stool positive occult blood.  GI recommended outpatient  follow-up since patient is having acute CHF exacerbation until emergent.  H&H remains stable.  Patient was adequately diuresed.  Patient feels much better and back to his baseline weight and breathing.  He is on goal-directed medical treatment with Coreg, ramipril, torsemide.  Echocardiogram shows reduced EF 45 to 50% patient continued on amiodarone and Eliquis for atrial fibrillation.  Patient is cleared from cardiology to be discharged.  Patient is being discharged ? ?Assessment and Plan: ?* Acute on chronic diastolic CHF (congestive heart failure) (HCC) ?Patient presented with worsening shortness of breath, pedal edema, 10 pound weight gain.   ?BNP 1057 likely secondary to CHF exacerbation. ?Patient has received IV Lasix 80 mg once, Continue Lasix 40 mg IV every 12 hours. ?Continue carvedilol,  Ramipril. ?Continue daily weight, intake output charting. ?Last EF on record from 01/10/2020 was 50 to 55%. ?Repeat echo showed LVEF 45-50%. ?Patient appears euvolemic on discharge. ? ? ?Hyperbilirubinemia ?Likely related to hepatic congestion from CHF ?Continue to trend ? ?Elevated troponin ?No plans for ischemic evaluation. ?Suspect demand ischemia in the setting of CHF exacerbation ? ?Chest pain-resolved as of 09/20/2021 ?EKG shows no ST-T wave changes. ?Suspect secondary to demand ischemia from CHF exacerbation ?Troponins remains flat. 46>46 ?Follow-up echo to evaluate for wall motion abnormality ?Cardiology consulted recommended to continue current management. ? ? ?Chronic anticoagulation ?Continue Eliquis ? ?Atrial fibrillation status post cardioversion 10/22/19 [facility]) ?Heart rate well controlled and remains in NSR.   ?Continue amiodarone, carvedilol, cardizem ?Continue Eliquis ? ?CAD S/P CABG x  2 ?Follow-up echocardiogram to evaluate for wall motion abnormality ?Continue carvedilol ,Lisinopril. ? ?History of GI bleed ?No evidence of acute bleeding at this time. ?Continue anticoagulation ? ?Chronic anemia ?Hb dropped from  9.6 > 7.7. ?No obvious visible bleeding noted. ?stool occult blood++ ?Monitor H&H. Transfusion threshold below 7.0 ?GI consulted recommended outpatient follow-up unless emergent.  Since patient is having acute CHF exacerbation. ? ?OSA on CPAP ?CPAP nightly. ? ?Chronic kidney disease, stage 3a (HCC) ?Renal function at baseline.  Monitor for worsening in view of Lasix ? ?DM (diabetes mellitus), type 2 (HCC) ?Sliding scale insulin.  Will hold home metformin ? ?Essential hypertension ?BP stable .  Continue home antihypertensives ? ? ?  ?  ? ?Pain control - [employer] [place] Controlled Substance Reporting System database was reviewed. and patient was instructed, not to drive, operate heavy machinery, perform activities at heights, swimming or participation in water activities or provide baby-sitting services while on Pain, Sleep and Anxiety Medications; until their outpatient Physician has advised to do so again. Also recommended to not to take more than prescribed Pain, Sleep and Anxiety Medications.  ? ?Consultants: Cardiology /gastroenterology ?Procedures performed: Echocardiogram ?Disposition: Home ?Diet recommendation:  ?Discharge Diet Orders (From admission, onward)  ? ?  Start     Ordered  ? 09/20/21 0000  Diet - low sodium heart healthy       ? 09/20/21 1011  ? 09/20/21 0000  Diet Carb Modified       ? 09/20/21 1011  ? ?  ?  ? ?  ? ?Carb modified diet ?DISCHARGE MEDICATION: ?Allergies as of 09/20/2021   ?No Known Allergies ?  ? ?  ?Medication List  ?  ? ?TAKE these medications   ? ?amiodarone 200 MG tablet ?Commonly known as: PACERONE ?Take 200 mg by mouth daily. ?  ?Eliquis 5 MG Tabs tablet ?Generic drug: apixaban ?Take by mouth. ?What changed: Another medication with the same name was added. Make sure you understand how and when to take each. ?  ?apixaban 5 MG Tabs tablet ?Commonly known as: ELIQUIS ?Take 1 tablet (5 mg total) by mouth 2 (two) times daily. ?What changed: You were already taking a medication with the  same name, and this prescription was added. Make sure you understand how and when to take each. ?  ?atorvastatin 40 MG tablet ?Commonly known as: LIPITOR ?Take 40 mg by mouth every evening. ?  ?carvedilol 3.125 MG tablet ?Commonly known as: COREG ?Take 1 tablet (3.125 mg total) by mouth 2 (two) times daily with a meal. ?  ?colchicine 0.6 MG tablet ?Take 0.6 mg by mouth 2 (two) times daily. ?  ?diltiazem 240 MG 24 hr capsule ?Commonly known as: CARDIZEM CD ?Take 240 mg by mouth daily. ?  ?docusate sodium 100 MG capsule ?Commonly known as: COLACE ?Take 1 capsule (100 mg total) by mouth 2 (two) times daily. ?  ?ferrous sulfate 325 (65 FE) MG tablet ?Take 325 mg by mouth daily with breakfast. ?  ?HYDROcodone-acetaminophen 5-325 MG tablet ?Commonly known as: NORCO/VICODIN ?Take 1 tablet by mouth every 4 (four) hours as needed for severe pain. ?  ?metFORMIN 500 MG 24 hr tablet ?Commonly known as: GLUCOPHAGE-XR ?Take 500 mg by mouth 2 (two) times daily. ?  ?ramipril 10 MG capsule ?Commonly known as: ALTACE ?Take 1 capsule (10 mg total) by mouth daily. ?  ?Semaglutide (1 MG/DOSE) 2 MG/1.5ML Sopn ?Inject 1 mg into the skin every Thursday. ?  ?torsemide 20 MG tablet ?Commonly known as:  DEMADEX ?Take 1 tablet (20 mg total) by mouth 2 (two) times daily. ?What changed:  ?how much to take ?when to take this ?additional instructions ?  ?traMADol 50 MG tablet ?Commonly known as: ULTRAM ?Take 1 tablet (50 mg total) by mouth every 8 (eight) hours as needed for moderate pain or severe pain. ?What changed: when to take this ?  ?vitamin B-12 1000 MCG tablet ?Commonly known as: CYANOCOBALAMIN ?Take 1,000 mcg by mouth daily. ?  ? ?  ? ? Follow-up Information   ? ? Dalia Heading, MD. Go in 1 week(s).   ?Specialty: Cardiology ?Why: one week 09/28/21 ?2;15pm ?Contact information: ?[address] ?[place] [address] ?[phone] ? ? ?  ?  ? ? Elspeth Cho., MD Follow up in 1 week(s).   ?Specialty: Internal Medicine ?Why:  09/25/21@10 :30am ?Contact information: ?[address] ?[address] ?[place] [address] ?[phone] ? ? ?  ?  ? ? Regis Bill, MD Follow up in 2 week(s).   ?Specialty: Gastroenterology ?Contact information:

## 2021-09-20 NOTE — Discharge Instructions (Signed)
Advised to follow-up with primary care physician in 1 week. ?Advised to follow-up with Dr. Lady Gary in 2 weeks. ?Advised to take amiodarone 200 mg daily, Eliquis 5 mg twice daily, torsemide 20 mg in the morning and 10 mg in the night. ?

## 2021-09-20 NOTE — Consult Note (Signed)
? ?  Heart Failure Nurse Navigator Note ? ?HFmrEF 45 to 50%.  Left ventricular internal cavity is mildly dilated.  Mild to moderate mitral regurgitation.  Moderate tricuspid regurgitation. ? ?He presented to the emergency room with complaints of chest pain, several days of shortness of breath, lower extremity edema with weeping.  Noting a 10 pound weight increase.  Chest x-ray revealed cardiomegaly with vascular congestion, pulmonary edema and small right pleural effusion. ? ?Comorbidities: ? ?Coronary artery disease/coronary artery bypass grafting ?Type 2 diabetes ?Hypertension ?Atrial fibrillation on NOAC ? ?Medications: ? ?Amiodarone 200 mg daily ?Apixaban 5 mg 2 times daily ?Atorvastatin 40 mg daily ?Carvedilol 3.125 mg 2 times a day with meals ?Diltiazem 240 mg daily ?Furosemide 40 mg IV 2 times a day ?Ramipril 10 mg daily ? ?Labs: ? ?Sodium 139, potassium 3.5, chloride 104, CO2 28, BUN 38, creatinine 1.67, GFR 41. ?Weight not completed ?Intake 120 mL ?Output 400 mL ? ? ?Patient meeting with patient who was sitting up in the chair at bedside and his son whom he lives next door to. ? ?He states that he has been compliant with his medications but is not compliant with fluid restriction.  And son comments that he does not understand why sticking to a fluid restriction is a problem.  Explained that drinking over the recommended amount makes more work for the heart. And also defeats taking the water pill. ? ?He states that he usually goes out to eat for breakfast, and eats evening meal with his son.  Does admit to using salt --recommending removing that from the table. ? ?Also discussed getting back in the routine of weighing daily and what to report. ? ?Also discussed his frequent falls, each time sounds like a mechanical fall, was not using his walker or cane.  Says he made unwise choices. ? ?They had no further questions. Has follow up in the heart failure clinic on May 11, at 10 AM.  Has a No show rating of  0%. ? ?Tresa Endo RN CHFN ?

## 2021-09-20 NOTE — TOC Initial Note (Signed)
Transition of Care (TOC) - Initial/Assessment Note  ? ? ?Patient Details  ?Name: Randall Vincent ?MRN: [id number] ?Date of Birth: [birth year] ? ?Transition of Care (TOC) CM/SW Contact:    ?Truddie Hidden, RN ?Phone Number: ?09/20/2021, 10:34 AM ? ?Clinical Narrative:                 ? ?Transition of Care (TOC) Screening Note ? ? ?Patient Details  ?Name: Randall Vincent ?Date of Birth: 05/21/1941 ? ? ?Transition of Care (TOC) CM/SW Contact:    ?Truddie Hidden, RN ?Phone Number: ?09/20/2021, 10:34 AM ? ? ? ?Transition of Care Department [facility]) has reviewed patient and no TOC needs have been identified at this time. We will continue to monitor patient advancement through interdisciplinary progression rounds. If new patient transition needs arise, please place a TOC consult. ?  ? ?  ?Barriers to Discharge: Continued Medical Work up ? ? ?Patient Goals and CMS Choice ?  ?  ?  ? ?Expected Discharge Plan and Services ?  ?  ?  ?  ?  ?Expected Discharge Date: 09/20/21               ?  ?  ?  ?  ?  ?  ?  ?  ?  ?  ? ?Prior Living Arrangements/Services ?  ?  ?  ?       ?  ?  ?  ?  ? ?Activities of Daily Living ?Home Assistive Devices/Equipment: Cane (specify quad or straight) ?ADL Screening (condition at time of admission) ?Patient's cognitive ability adequate to safely complete daily activities?: Yes ?Is the patient deaf or have difficulty hearing?: No ?Does the patient have difficulty seeing, even when wearing glasses/contacts?: No ?Does the patient have difficulty concentrating, remembering, or making decisions?: No ?Patient able to express need for assistance with ADLs?: Yes ?Does the patient have difficulty dressing or bathing?: No ?Independently performs ADLs?: Yes (appropriate for developmental age) ?Does the patient have difficulty walking or climbing stairs?: No ?Weakness of Legs: None ?Weakness of Arms/Hands: None ? ?Permission Sought/Granted ?  ?  ?   ?   ?   ?   ? ?Emotional Assessment ?  ?  ?  ?  ?  ?  ? ?Admission diagnosis:   Acute respiratory failure with hypoxia (HCC) [J96.01] ?Acute on chronic diastolic (congestive) heart failure (HCC) [I50.33] ?Acute on chronic congestive heart failure, unspecified heart failure type (HCC) [I50.9] ?Patient Active Problem List  ? Diagnosis Date Noted  ? History of GI bleed   ? Hyperbilirubinemia   ? 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 CHF (congestive heart failure) (HCC) 02/10/2020  ? CHF exacerbation (HCC) 02/03/2020  ? Acute exacerbation of CHF (congestive heart failure) (HCC) 02/02/2020  ? Pressure injury of skin 01/30/2020  ? Acute on chronic diastolic CHF (congestive heart failure) (HCC) 01/27/2020  ? Acute-on-chronic kidney injury (HCC) 01/27/2020  ? A-fib (HCC)   ? Obesity, Class III, BMI 40-49.9 (morbid obesity) (HCC)   ? Diverticulosis of colon without hemorrhage   ? Intestinal bypass or anastomosis status   ? Melena 01/10/2020  ? Chronic anemia 01/10/2020  ? Acute blood loss anemia 01/10/2020  ? Suspect Septic arthritis (HCC) 01/10/2020  ? Chronic anticoagulation 01/10/2020  ? History of bilateral knee replacement 01/10/2020  ? Chronic kidney disease, stage 3a (HCC) 01/10/2020  ? Acute on chronic congestive heart failure (HCC) 01/10/2020  ? Elevated troponin 01/10/2020  ?  Hyperlipidemia associated with type 2 diabetes mellitus (HCC) 07/04/2019  ? Chest pain 06/26/2019  ? CAD, multiple vessel 06/26/2019  ? CAD 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/26/2019  ? Atrial fibrillation status post cardioversion 10/22/19 (HCC) 06/26/2019  ? Essential hypertension 06/26/2019  ? DM (diabetes mellitus), type 2 (HCC) 06/26/2019  ? Anginal equivalent (HCC) 06/26/2019  ? Drug-induced constipation 11/02/2018  ? Low serum vitamin B12 11/02/2018  ? Venous stasis dermatitis of both lower extremities 11/17/2017  ? Ventral hernia without obstruction or gangrene 09/08/2016  ? Peripheral edema 04/22/2016  ? Hay fever 08/13/2015  ? Impacted cerumen of both ears 08/13/2015  ?  Seasonal allergic rhinitis due to pollen 08/13/2015  ? Bilateral edema of lower extremity 08/05/2015  ? Localized edema 06/17/2015  ? DOE (dyspnea on exertion) 05/19/2015  ? Hyperlipidemia 05/19/2015  ? 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 OSA on CPAP 05/03/2015  ? ?PCP:  Elspeth Cho., MD ?Pharmacy:   ?[facility] [place], [place] - [address] ?[address] ?[place] [address] ?Phone: [phone] Fax: [phone] ? ? ? ? ?Social Determinants of Health (SDOH) Interventions ?  ? ?Readmission Risk Interventions ?   ? View : No data to display.  ?  ?  ?  ? ? ? ?

## 2021-09-20 NOTE — Progress Notes (Signed)
[name]  ? ?    ?Patient ID: ?Randall Vincent ?MRN: [id number] ?DOB/AGE: 07/05/1940 81 y.o. ? ?Admit date: 09/16/2021 ?Referring Physician Dr. Cipriano Vincent ?Primary Physician [facility], Randall fowler, PA-C ?Primary Cardiologist Dr. Lady Vincent ?Reason for Consultation acute on chronic HF ? ?HPI: Randall Vincent is a 80yoM with a PMH of CAD s/p CABG x2 1991 and BMS, paroxysmal AF s/p DCCV 2021 on Eliquis, HFpEF (LVEF 45-50%, mi-mod MR, mod TR 08/2021), hypertension, type 2 diabetes, CKD3, who presented to [facility] ED 09/16/21 with a several day history of shortness of breath, lower extremity weeping, a 10 pound weight gain, and generalized weakness leading to fall. Cardiology is consulted for assistance with his HF.  ? ?Interval History: ?-feels well, ambulated around the floor yesterday with a walker without significant difficulty  ?-no chest pain, palpitations, leg swelling improved.  ?-echo resulted with slightly reduced EF 45-50% (previously 50-55% 2021), mild-mod MR, mod TR ? ?Review of systems complete and found to be negative unless listed above  ? ? ? ?Past Medical History:  ?Diagnosis Date  ? A-fib (HCC)   ? a.) CHA2DS2-VASc = 6 (age x 2, CHF, HTN, previous MI, T2DM). b.) Rate/rhythm maintained on amiodarone + diltiazem + carvedilol; chronically anticoagulated using dabigatran. c.) DCCV 07/10/2015, 08/08/2019, 10/22/2019.  ? Arthritis   ? CHF (congestive heart failure) (HCC)   ? CKD (chronic kidney disease), stage III (HCC)   ? Coronary artery disease 1991  ? a.) 2v CABG in 1991. b.) PCI 06/07/2006 --> Vision stents to native LCx and RCA  ? Hyperlipidemia   ? Hypertension   ? Long term current use of anticoagulant   ? a.) dabigatran  ? Myocardial infarction (HCC) 1991  ? a.) LHC --> 50% and 90% LAD lesions; referred to CVTS. b.) ultimately underwent 2v CABG (LIMA-LAD, SVG-RCA)  ? OSA on CPAP   ? 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 a.) LIMA-LAD, SVG-RCA  ? T2DM (type 2 diabetes mellitus) (HCC)    ?  ?Past Surgical History:  ?Procedure Laterality Date  ? BACK SURGERY  1970  ? L5-L6 ruptured disk  ? BLADDER SURGERY  2000  ? CARDIOVERSION N/A 07/10/2015  ? CARDIOVERSION N/A 08/08/2019  ? CARDIOVERSION N/A 10/22/2019  ? COLON SURGERY    ? COLONOSCOPY N/A 01/15/2020  ? Procedure: COLONOSCOPY;  Surgeon: Pasty Spillers, MD;  Location: [facility] ENDOSCOPY;  Service: Endoscopy;  Laterality: N/A;  ? COLONOSCOPY N/A 01/16/2020  ? Procedure: COLONOSCOPY;  Surgeon: Pasty Spillers, MD;  Location: [facility] ENDOSCOPY;  Service: Endoscopy;  Laterality: N/A;  ? CORONARY ARTERY BYPASS GRAFT N/A 01/02/1990  ? Procedure: 2v CORONARY ARTERY BYPASS GRAFT (LIMA-LAD, SVG-RCA)  ? ESOPHAGOGASTRODUODENOSCOPY N/A 01/13/2020  ? Procedure: ESOPHAGOGASTRODUODENOSCOPY (EGD);  Surgeon: Pasty Spillers, MD;  Location: [name] ENDOSCOPY;  Service: Endoscopy;  Laterality: N/A;  ? REPLACEMENT TOTAL KNEE BILATERAL Bilateral 2008  ? STENT PLACEMENT VASCULAR ([facility] HX)  2008  ? TOTAL KNEE REVISION Left 01/16/2020  ? Procedure: TOTAL KNEE REVISION;  Surgeon: Lyndle Herrlich, MD;  Location: [facility] ORS;  Service: Orthopedics;  Laterality: Left;  ? TOTAL KNEE REVISION Left 05/31/2021  ? Procedure: TOTAL KNEE REVISION;  Surgeon: Lyndle Herrlich, MD;  Location: [facility] ORS;  Service: Orthopedics;  Laterality: Left;  ?  ?Medications Prior to Admission  ?Medication Sig Dispense Refill Last Dose  ? amiodarone (PACERONE) 200 MG tablet Take 200 mg by mouth daily.   09/16/2021  ? atorvastatin (LIPITOR) 40 MG tablet Take  40 mg by mouth every evening.    09/15/2021  ? carvedilol (COREG) 3.125 MG tablet Take 1 tablet (3.125 mg total) by mouth 2 (two) times daily with a meal. 60 tablet 1 09/16/2021  ? diltiazem (CARDIZEM CD) 240 MG 24 hr capsule Take 240 mg by mouth daily.   09/16/2021  ? docusate sodium (COLACE) 100 MG capsule Take 1 capsule (100 mg total) by mouth 2 (two) times daily. 30 capsule 0 09/16/2021  ? ferrous sulfate 325 (65 FE) MG tablet Take 325 mg by mouth  daily with breakfast.   09/16/2021  ? metFORMIN (GLUCOPHAGE-XR) 500 MG 24 hr tablet Take 500 mg by mouth 2 (two) times daily.   09/16/2021  ? ramipril (ALTACE) 10 MG capsule Take 1 capsule (10 mg total) by mouth daily. 30 capsule 1 09/16/2021  ? Semaglutide, 1 MG/DOSE, 2 MG/1.5ML SOPN Inject 1 mg into the skin every Thursday.   09/16/2021  ? torsemide (DEMADEX) 20 MG tablet Take 1 tablet (20 mg total) by mouth 2 (two) times daily. (Patient taking differently: Take 10-20 mg by mouth See admin instructions. Take 20 mg by mouth in the morning and 10 mg in the evening) 60 tablet 1 09/16/2021 at 1600  ? vitamin B-12 (CYANOCOBALAMIN) 1000 MCG tablet Take 1,000 mcg by mouth daily.   09/16/2021  ? colchicine 0.6 MG tablet Take 0.6 mg by mouth 2 (two) times daily.   prn  ? HYDROcodone-acetaminophen (NORCO/VICODIN) 5-325 MG tablet Take 1 tablet by mouth every 4 (four) hours as needed for severe pain. 30 tablet 0 prn  ? traMADol (ULTRAM) 50 MG tablet Take 1 tablet (50 mg total) by mouth every 8 (eight) hours as needed for moderate pain or severe pain. (Patient taking differently: Take 50 mg by mouth 4 (four) times daily as needed for moderate pain or severe pain.) 10 tablet 0   ? ? ?Social History  ? ?Socioeconomic History  ? Marital status: Widowed  ?  Spouse name: Not on file  ? Number of children: Not on file  ? Years of education: Not on file  ? Highest education level: Not on file  ?Occupational History  ? Not on file  ?Tobacco Use  ? Smoking status: Former  ?  Types: Cigarettes  ?  Quit date: 741991  ?  Years since quitting: 32.3  ? Smokeless tobacco: Never  ?Vaping Use  ? Vaping Use: Never used  ?Substance and Sexual Activity  ? Alcohol use: Yes  ?  Alcohol/week: 1.0 standard drink  ?  Types: 1 Cans of beer per week  ?  Comment: very rarely  ? Drug use: Not on file  ? Sexual activity: Not on file  ?Other Topics Concern  ? Not on file  ?Social History Narrative  ? Not on file  ? ?Social Determinants of Health  ? ?Financial  Resource Strain: Not on file  ?Food Insecurity: Not on file  ?Transportation Needs: Not on file  ?Physical Activity: Not on file  ?Stress: Not on file  ?Social Connections: Not on file  ?Intimate Partner Violence: Not on file  ?  ?History reviewed. No pertinent family history.  ? ?PHYSICAL EXAM ?General: Pleasant and conversational elderly Caucasian male, well nourished, in no acute distress. Sitting upright in recliner ?HEENT:  Normocephalic.  ABD pad covering right side of his forehead. Exotropia of R eye.  ?Neck:  No JVD.  ?Lungs: Normal respiratory effort on room air.  Decreased breath sounds without crackles or wheezes.  ?Heart:  HRRR . Normal S1 and S2 without gallops or murmurs.  ?Abdomen: Soft, nontender, obese ?Msk: Normal strength and tone for age. ?Extremities: Warm and well perfused.  Scattered ecchymosis on left upper extremity wounds covered with Dermabond from his previous ED visit 4/26.  Discoloration of bilateral lower extremities C/W likely venous stasis changes, left lower leg wrapped with Ace bandage.  No peripheral edema ?Neuro: Alert and oriented X 3. ?Psych:  Answers questions appropriately.  ? ?Labs: ?  ?Lab Results  ?Component Value Date  ? WBC 8.6 09/20/2021  ? HGB 8.4 (L) 09/20/2021  ? HCT 26.5 (L) 09/20/2021  ? MCV 85.2 09/20/2021  ? PLT 214 09/20/2021  ?  ?Recent Labs  ?Lab 09/17/21 ?2633 09/18/21 ?3545 09/20/21 ?0353  ?NA 138   < > 139  ?K 3.3*   < > 3.5  ?CL 102   < > 104  ?CO2 28   < > 28  ?BUN 31*   < > 38*  ?CREATININE 1.69*   < > 1.67*  ?CALCIUM 8.2*   < > 7.8*  ?PROT 6.4*  --   --   ?BILITOT 1.2  --   --   ?ALKPHOS 112  --   --   ?ALT 39  --   --   ?AST 29  --   --   ?GLUCOSE 102*   < > 126*  ? < > = values in this interval not displayed.  ? ? ?No results found for: CKTOTAL, CKMB, CKMBINDEX, TROPONINI No results found for: CHOL ?No results found for: HDL ?No results found for: LDLCALC ?No results found for: TRIG ?No results found for: CHOLHDL ?No results found for: LDLDIRECT  ?   ?Radiology: CT Head Wo Contrast ? ?Result Date: 09/15/2021 ?CLINICAL DATA:  Head trauma, intracranial venous injury suspected; Facial trauma, blunt EXAM: CT HEAD WITHOUT CONTRAST CT MAXILLOFACIAL WITHOUT CO

## 2021-09-21 ENCOUNTER — Emergency Department
Admission: EM | Admit: 2021-09-21 | Discharge: 2021-09-21 | Disposition: A | Discharge status: Home / Self Care | Payer: Medicare Other | Attending: Emergency Medicine | Admitting: Emergency Medicine

## 2021-09-21 ENCOUNTER — Emergency Department: Payer: Medicare Other

## 2021-09-21 ENCOUNTER — Other Ambulatory Visit: Payer: Self-pay

## 2021-09-21 DIAGNOSIS — S51811A Laceration without foreign body of right forearm, initial encounter: Secondary | ICD-10-CM | POA: Diagnosis not present

## 2021-09-21 DIAGNOSIS — W268XXA Contact with other sharp object(s), not elsewhere classified, initial encounter: Secondary | ICD-10-CM | POA: Diagnosis not present

## 2021-09-21 DIAGNOSIS — S59911A Unspecified injury of right forearm, initial encounter: Secondary | ICD-10-CM | POA: Diagnosis present

## 2021-09-21 MED ORDER — LIDOCAINE-EPINEPHRINE 2 %-1:100000 IJ SOLN
20.0000 mL | Freq: Once | INTRAMUSCULAR | Status: AC
  Administered 2021-09-21: 20 mL
  Filled 2021-09-21: qty 1

## 2021-09-21 MED ORDER — CEPHALEXIN 500 MG PO CAPS: 500.0000 mg | ORAL_CAPSULE | Freq: Three times a day (TID) | ORAL | 0 refills | Status: AC

## 2021-09-21 NOTE — ED Provider Notes (Signed)
? ?[facility] ?Provider Note ? ?Patient Contact: 6:21 PM (approximate) ? ? ?History  ? ?Laceration ? ? ?HPI ? ?Louise Victory is a 81 y.o. male presents to the emergency department with a skin tear along the right forearm.  Patient states that he was wearing a longsleeve shirt and he lost his balance while preparing strawberries.  Patient states that when he pulled up his sleeve he noticed skin tear.  He has had some recent falls in the past.  He denies headache, chest pain, chest tightness or abdominal pain. ? ?  ? ? ?Physical Exam  ? ?Triage Vital Signs: ?ED Triage Vitals  ?Enc Vitals Group  ?   BP 09/21/21 1553 123/61  ?   Pulse Rate 09/21/21 1553 83  ?   Resp 09/21/21 1553 18  ?   Temp 09/21/21 1553 97.9 ?F (36.6 ?C)  ?   Temp Source 09/21/21 1553 Oral  ?   SpO2 09/21/21 1553 94 %  ?   Weight 09/21/21 1554 225 lb 12 oz (102.4 kg)  ?   Height 09/21/21 1554 5\' 8"  (1.727 m)  ?   Head Circumference --   ?   Peak Flow --   ?   Pain Score 09/21/21 1554 6  ?   Pain Loc --   ?   Pain Edu? --   ?   Excl. in GC? --   ? ? ?Most recent vital signs: ?Vitals:  ? 09/21/21 1553  ?BP: 123/61  ?Pulse: 83  ?Resp: 18  ?Temp: 97.9 ?F (36.6 ?C)  ?SpO2: 94%  ? ? ? ?General: Alert and in no acute distress. ?Eyes:  PERRL. EOMI. ?Head: No acute traumatic findings ?ENT: ?     Ears:  ?     Nose: No congestion/rhinnorhea. ?     Mouth/Throat: Mucous membranes are moist. ?Neck: No stridor. No cervical spine tenderness to palpation. ?Cardiovascular:  Good peripheral perfusion ?Respiratory: Normal respiratory effort without tachypnea or retractions. Lungs CTAB. Good air entry to the bases with no decreased or absent breath sounds. ?Gastrointestinal: Bowel sounds ?4 quadrants. Soft and nontender to palpation. No guarding or rigidity. No palpable masses. No distention. No CVA tenderness. ?Musculoskeletal: Full range of motion to all extremities.  ?Neurologic:  No gross focal neurologic deficits are appreciated.  ?Skin:  Patient has large, macerated skin tear along the right forearm, 8 cm in length by 5 cm in width. ?Other: ? ? ?ED Results / Procedures / Treatments  ? ?Labs ?(all labs ordered are listed, but only abnormal results are displayed) ?Labs Reviewed - No data to display ? ? ? ? ? ?RADIOLOGY ? ?I personally viewed and evaluated these images as part of my medical decision making, as well as reviewing the written report by the radiologist. ? ?ED Provider Interpretation: Patient has 4 mm foreign body within the right forearm visualized near laceration. No acute bony abnormality was visualized.  I agree with radiologist interpretation. ? ? ?PROCEDURES: ? ?Critical Care performed: No ? ?11/21/21.Laceration Repair ? ?Date/Time: 09/21/2021 6:25 PM ?Performed by: 11/21/2021 M, PA-C ?Authorized by: Pia Mau M, PA-C  ? ?Consent:  ?  Consent obtained:  Verbal ?  Risks discussed:  Infection and pain ?Universal protocol:  ?  Procedure explained and questions answered to patient or proxy's satisfaction: yes   ?  Patient identity confirmed:  Verbally with patient ?Anesthesia:  ?  Anesthesia method:  Local infiltration ?  Local anesthetic:  Lidocaine 1% WITH epi ?Laceration  details:  ?  Location:  Shoulder/arm ?  Shoulder/arm location:  R lower arm ?  Length (cm):  8 ?  Depth (mm):  5 ?Pre-procedure details:  ?  Preparation:  Patient was prepped and draped in usual sterile fashion ?Exploration:  ?  Limited defect created (wound extended): no   ?  Imaging outcome: foreign body not noted   ?  Contaminated: yes   ?Treatment:  ?  Area cleansed with:  Povidone-iodine ?  Debridement:  None ?Skin repair:  ?  Repair method:  Sutures ?  Suture size:  5-0 ?  Suture technique:  Running locked ?  Number of sutures:  50 ?Approximation:  ?  Approximation:  Close ?Repair type:  ?  Repair type:  Complex ?Post-procedure details:  ?  Dressing:  Non-adherent dressing ? ? ?MEDICATIONS ORDERED IN ED: ?Medications  ?lidocaine-EPINEPHrine (XYLOCAINE W/EPI) 2  %-1:100000 (with pres) injection 20 mL (20 mLs Infiltration Given by Other 09/21/21 1749)  ? ? ? ?IMPRESSION / MDM / ASSESSMENT AND PLAN / ED COURSE  ?I reviewed the triage vital signs and the nursing notes. ?             ?               ? ?Assessment and plan ?Laceration ?81 year old male presents to the emergency department with a macerated laceration of the right forearm, approximately 8 cm in width by 5 cm in length.  Laceration was repaired in the emergency department using suture.  No foreign body was visualized during repair and I suspect that foreign body is chronic as patient was wearing a longsleeve sweatshirt and did not have exposure to possible foreign bodies during fall.  Patient was discharged with Keflex and has an appointment with his wound care provider tomorrow. ?  ? ? ?FINAL CLINICAL IMPRESSION(S) / ED DIAGNOSES  ? ?Final diagnoses:  ?Laceration of right forearm, initial encounter  ? ? ? ?Rx / DC Orders  ? ?ED Discharge Orders   ? ?      Ordered  ?  cephALEXin (KEFLEX) 500 MG capsule  3 times daily       ? 09/21/21 1809  ? ?  ?  ? ?  ? ? ? ?Note:  This document was prepared using Dragon voice recognition software and may include unintentional dictation errors. ?  ?Orvil Feil, PA-C ?09/21/21 1829 ? ?  ?Gilles Chiquito, MD ?09/21/21 1845 ? ?

## 2021-09-21 NOTE — ED Triage Notes (Signed)
Patient to ER via POV with laceration present to right forearm. Patient reports falling in his camper and cutting his arm this afternoon. Reports some difficulty moving arm due to pain. CNS in tact, good cap refill.  ? ?Patient denies LOC or hitting head. Patient takes eliquis. Dressing in place. Bleeding well controlled at this time.  ?

## 2021-09-22 ENCOUNTER — Encounter: Payer: Medicare Other | Attending: Internal Medicine | Admitting: Internal Medicine

## 2021-09-22 DIAGNOSIS — Z951 Presence of aortocoronary bypass graft: Secondary | ICD-10-CM | POA: Diagnosis not present

## 2021-09-22 DIAGNOSIS — L98492 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d: Secondary | ICD-10-CM | POA: Diagnosis present

## 2021-09-22 DIAGNOSIS — N183 Chronic kidney disease, stage 3 unspecified: Secondary | ICD-10-CM | POA: Insufficient documentation

## 2021-09-22 DIAGNOSIS — I2510 Atherosclerotic heart disease of native coronary artery without angina pectoris: Secondary | ICD-10-CM | POA: Diagnosis not present

## 2021-09-22 DIAGNOSIS — W010XXA Fall on same level from slipping, tripping and stumbling without subsequent striking against object, initial encounter: Secondary | ICD-10-CM | POA: Insufficient documentation

## 2021-09-22 DIAGNOSIS — S41101A Unspecified open wound of right upper arm, initial encounter: Secondary | ICD-10-CM | POA: Insufficient documentation

## 2021-09-22 DIAGNOSIS — S40811A Abrasion of right upper arm, initial encounter: Secondary | ICD-10-CM | POA: Insufficient documentation

## 2021-09-22 DIAGNOSIS — E11622 Type 2 diabetes mellitus with other skin ulcer: Secondary | ICD-10-CM | POA: Diagnosis not present

## 2021-09-22 DIAGNOSIS — Y93K1 Activity, walking an animal: Secondary | ICD-10-CM | POA: Diagnosis not present

## 2021-09-22 DIAGNOSIS — I509 Heart failure, unspecified: Secondary | ICD-10-CM | POA: Insufficient documentation

## 2021-09-22 DIAGNOSIS — E1122 Type 2 diabetes mellitus with diabetic chronic kidney disease: Secondary | ICD-10-CM | POA: Insufficient documentation

## 2021-09-22 NOTE — Progress Notes (Signed)
SHARIFF, LASKY ([id number]) ?Visit Report for 09/22/2021 ?Chief Complaint Document Details ?Patient Name: JDEN, WANT ?Date of Service: 09/22/2021 11:30 AM ?Medical Record Number: [id number] ?Patient Account Number: [id number] ?Date of Birth/Sex: 1940-07-12 (81 y.o. M) ?Treating RN: Hansel Feinstein ?Primary Care Provider: Patrina Levering Other Clinician: ?Referring Provider: Patrina Levering ?Treating Provider/Extender: Geralyn Corwin ?Weeks in Treatment: 2 ?Information Obtained from: Patient ?Chief Complaint ?09/08/2021; wound to the right upper extremity due to trauma ?Electronic Signature(s) ?Signed: 09/22/2021 2:19:50 PM By: Geralyn Corwin DO ?Entered By: Geralyn Corwin on 09/22/2021 12:00:26 ?ALLAH, REASON ([id number]) ?-------------------------------------------------------------------------------- ?Debridement Details ?Patient Name: BOLESLAUS, HOLLOWAY ?Date of Service: 09/22/2021 11:30 AM ?Medical Record Number: [id number] ?Patient Account Number: [id number] ?Date of Birth/Sex: 09-21-1940 (81 y.o. M) ?Treating RN: Hansel Feinstein ?Primary Care Provider: Patrina Levering Other Clinician: ?Referring Provider: Patrina Levering ?Treating Provider/Extender: Geralyn Corwin ?Weeks in Treatment: 2 ?Debridement Performed for ?Wound #2 Right Forearm ?Assessment: ?Performed By: Physician Geralyn Corwin, MD ?Debridement Type: Chemical/Enzymatic/Mechanical ?Agent Used: saline gauze ?Level of Consciousness (Pre- ?Awake and Alert ?procedure): ?Pre-procedure Verification/Time Out ?Yes - 12:00 ?Taken: ?Start Time: 12:00 ?Instrument: ?Other : saline gauze ?Bleeding: Moderate ?Hemostasis Achieved: Pressure ?Response to Treatment: Procedure was tolerated well ?Level of Consciousness (Post- ?Awake and Alert ?procedure): ?Post Debridement Measurements of Total Wound ?Length: (cm) 5 ?Width: (cm) 6 ?Depth: (cm) 0.1 ?Volume: (cm?) 2.356 ?Character of Wound/Ulcer Post Debridement: Improved ?Post Procedure Diagnosis ?Same as Pre-procedure ?Electronic  Signature(s) ?Signed: 09/22/2021 2:19:31 PM By: Hansel Feinstein ?Signed: 09/22/2021 2:19:50 PM By: Geralyn Corwin DO ?Entered ByHansel Feinstein on 09/22/2021 12:01:18 ?JEROL, RUFENER ([id number]) ?-------------------------------------------------------------------------------- ?HPI Details ?Patient Name: SALVADOR, COUPE ?Date of Service: 09/22/2021 11:30 AM ?Medical Record Number: [id number] ?Patient Account Number: [id number] ?Date of Birth/Sex: [birth year] (81 y.o. M) ?Treating RN: Hansel Feinstein ?Primary Care Provider: Patrina Levering Other Clinician: ?Referring Provider: Patrina Levering ?Treating Provider/Extender: Geralyn Corwin ?Weeks in Treatment: 2 ?History of Present Illness ?HPI Description: Admission 09/08/2021 ?Mr. Reyden Smith is a 81 year old male with a past medical history of congestive heart failure, CKD stage III, type 2 diabetes currently ?controlled on oral medications and coronary artery disease status post CABG x2 that presents the clinic for a wound on his right arm for the past ?two weeks. He states he was walking his dog and Got tripped over the leash and fell creating an abrasion to his right arm. He states he visited ?urgent care and they helped stop the bleeding. He has not been using any wound care dressings but has been keeping the area covered with a ?Band-Aid. He currently denies signs of infection. ?4/26; patient presents for follow-up. He has been using Xeroform dressings without issues. He reports improvement in wound healing. He has no ?issues or complaints today. ?5/3; patient presents for follow-up. He has been using Xeroform without issues. Unfortunately he fell yesterday and developed a skin tear to his ?right upper extremity. He visited the ED for this issue. They placed 50 sutures. He was started on Keflex. ?Electronic Signature(s) ?Signed: 09/22/2021 2:19:50 PM By: Geralyn Corwin DO ?Entered By: Geralyn Corwin on 09/22/2021 12:03:32 ?WHITMAN, MEINHARDT  ([id number]) ?-------------------------------------------------------------------------------- ?Physical Exam Details ?Patient Name: CLELAND, SIMKINS ?Date of Service: 09/22/2021 11:30 AM ?Medical Record Number: [id number] ?Patient Account Number: [id number] ?Date of Birth/Sex: [birth year] (81 y.o. M) ?Treating RN: Hansel Feinstein ?Primary Care Provider: Patrina Levering Other Clinician: ?Referring Provider: Patrina Levering ?Treating Provider/Extender: Geralyn Corwin ?Weeks in Treatment: 2 ?Constitutional ?. ?Psychiatric ?[name] ?Notes ?Right upper extremity: To the posterior lateral aspect near the elbow there is epithelization to the previous  wound site. To the anterior aspect ?there is a skin tear held together by numerous sutures. The edges are not approximating. No surrounding signs of infection. ?Electronic Signature(s) ?Signed: 09/22/2021 2:19:50 PM By: Geralyn Corwin DO ?Entered By: Geralyn Corwin on 09/22/2021 12:04:08 ?TORIBIO, SEIBER ([id number]) ?-------------------------------------------------------------------------------- ?Physician Orders Details ?Patient Name: ELAN, BRAINERD ?Date of Service: 09/22/2021 11:30 AM ?Medical Record Number: [id number] ?Patient Account Number: [id number] ?Date of Birth/Sex: 30-Jun-1940 (81 y.o. M) ?Treating RN: Hansel Feinstein ?Primary Care Provider: Patrina Levering Other Clinician: ?Referring Provider: Patrina Levering ?Treating Provider/Extender: Geralyn Corwin ?Weeks in Treatment: 2 ?Verbal / Phone Orders: No ?Diagnosis Coding ?Follow-up Appointments ?o Return Appointment in 1 week. ?Bathing/ Shower/ Hygiene ?o Clean wound with Normal Saline or wound cleanser. ?Anesthetic (Use 'Patient Medications' Section for Anesthetic Order Entry) ?o Lidocaine applied to wound bed ?Wound Treatment ?Wound #2 - Forearm Wound Laterality: Right ?Cleanser: Normal Saline 1 x Per Day/30 Days ?Discharge Instructions: Wash your hands with soap and water. Remove old dressing, discard into plastic bag and place into  trash. ?Cleanse the wound with Normal Saline prior to applying a clean dressing using gauze sponges, not tissues or cotton balls. Do not scrub ?or use excessive force. Pat dry using gauze sponges, not tissue or cotton balls. ?Topical: Bacitracin Ointment, 1 (oz) tube 1 x Per Day/30 Days ?Secondary Dressing: ABD Pad 5x9 (in/in) (DME) (Generic) 1 x Per Day/30 Days ?Discharge Instructions: Cover with ABD pad ?Secured With: Medipore Tape - 45M Medipore H Soft Cloth Surgical Tape, 2x2 (in/yd) (DME) (Generic) 1 x Per Day/30 Days ?Discharge Instructions: NO tape on skin ?Secured With: [employer] or Non-Sterile 6-ply 4.5x4 (yd/yd) (DME) (Generic) 1 x Per Day/30 Days ?Discharge Instructions: Apply Kerlix as directed ?Secured With: [employer], Size 4, 10 (yds) 1 x Per Day/30 Days ?Electronic Signature(s) ?Signed: 09/22/2021 2:19:31 PM By: Hansel Feinstein ?Signed: 09/22/2021 2:19:50 PM By: Geralyn Corwin DO ?Entered ByHansel Feinstein on 09/22/2021 12:01:29 ?DEMARRIUS, GUERRERO ([id number]) ?-------------------------------------------------------------------------------- ?Problem List Details ?Patient Name: CLOYDE, OREGEL ?Date of Service: 09/22/2021 11:30 AM ?Medical Record Number: [id number] ?Patient Account Number: [id number] ?Date of Birth/Sex: 1940-07-22 (81 y.o. 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

## 2021-09-22 NOTE — Progress Notes (Signed)
TABITHA, TUPPER ([id number]) ?Visit Report for 09/22/2021 ?Arrival Information Details ?Patient Name: Randall Vincent, Randall Vincent ?Date of Service: 09/22/2021 11:30 AM ?Medical Record Number: [id number] ?Patient Account Number: [id number] ?Date of Birth/Sex: 24-Apr-1941 (81 y.o. M) ?Treating RN: Hansel Feinstein ?Primary Care Haniya Fern: Patrina Levering Other Clinician: ?Referring Amaiya Scruton: Patrina Levering ?Treating Isael Stille/Extender: Geralyn Corwin ?Weeks in Treatment: 2 ?Visit Information History Since Last Visit ?Added or deleted any medications: No ?Patient Arrived: Randall Vincent ?Had a fall or experienced change in Yes ?Arrival Time: 11:25 ?activities of daily living that may affect ?Accompanied By: son ?risk of falls: ?Transfer Assistance: None ?Hospitalized since last visit: No ?Patient Identification Verified: Yes ?Has Dressing in Place as Prescribed: Yes ?Secondary Verification Process Completed: Yes ?Pain Present Now: No ?Patient Requires Transmission-Based No ?Precautions: ?Patient Has Alerts: Yes ?Patient Alerts: Patient on Blood ?Thinner ?Eliquis ?Diabetic ?Electronic Signature(s) ?Signed: 09/22/2021 2:19:31 PM By: Hansel Feinstein ?Entered ByHansel Feinstein on 09/22/2021 11:25:49 ?Randall Vincent, Randall Vincent ([id number]) ?-------------------------------------------------------------------------------- ?Clinic Level of Care Assessment Details ?Patient Name: Randall Vincent, Randall Vincent ?Date of Service: 09/22/2021 11:30 AM ?Medical Record Number: [id number] ?Patient Account Number: [id number] ?Date of Birth/Sex: 1940/08/06 (81 y.o. M) ?Treating RN: Hansel Feinstein ?Primary Care Dillard Pascal: Patrina Levering Other Clinician: ?Referring Baylor Cortez: Patrina Levering ?Treating Wetona Viramontes/Extender: Geralyn Corwin ?Weeks in Treatment: 2 ?Clinic Level of Care Assessment Items ?TOOL 4 Quantity Score ?[]  - Use when only an EandM is performed on FOLLOW-UP visit 0 ?ASSESSMENTS - Nursing Assessment / Reassessment ?X - Reassessment of Co-morbidities (includes updates in patient status) 1 10 ?X- 1  5 ?Reassessment of Adherence to Treatment Plan ?ASSESSMENTS - Wound and Skin Assessment / Reassessment ?[]  - Simple Wound Assessment / Reassessment - one wound 0 ?X- 2 5 ?Complex Wound Assessment / Reassessment - multiple wounds ?[]  - 0 ?Dermatologic / Skin Assessment (not related to wound area) ?ASSESSMENTS - Focused Assessment ?[]  - Circumferential Edema Measurements - multi extremities 0 ?[]  - 0 ?Nutritional Assessment / Counseling / Intervention ?[]  - 0 ?Lower Extremity Assessment (monofilament, tuning fork, pulses) ?[]  - 0 ?Peripheral Arterial Disease Assessment (using hand held doppler) ?ASSESSMENTS - Ostomy and/or Continence Assessment and Care ?[]  - Incontinence Assessment and Management 0 ?[]  - 0 ?Ostomy Care Assessment and Management (repouching, etc.) ?PROCESS - Coordination of Care ?X - Simple Patient / Family Education for ongoing care 1 15 ?[]  - 0 ?Complex (extensive) Patient / Family Education for ongoing care ?X- 1 10 ?Staff obtains Consents, Records, Test Results / Process Orders ?[]  - 0 ?Staff telephones HHA, Nursing Homes / Clarify orders / etc ?[]  - 0 ?Routine Transfer to another Facility (non-emergent condition) ?[]  - 0 ?Routine Hospital Admission (non-emergent condition) ?[]  - 0 ?New Admissions / / Ordering NPWT, Apligraf, etc. ?[]  - 0 ?Emergency Hospital Admission (emergent condition) ?X- 1 10 ?Simple Discharge Coordination ?[]  - 0 ?Complex (extensive) Discharge Coordination ?PROCESS - Special Needs ?[]  - Pediatric / Minor Patient Management 0 ?[]  - 0 ?Isolation Patient Management ?[]  - 0 ?Hearing / Language / Visual special needs ?[]  - 0 ?Assessment of Community assistance (transportation, D/C planning, etc.) ?[]  - 0 ?Additional assistance / Altered mentation ?[]  - 0 ?Support Surface(s) Assessment (bed, cushion, seat, etc.) ?INTERVENTIONS - Wound Cleansing / Measurement ?Randall Vincent, Randall Vincent ( ) ?[]  - 0 ?Simple Wound Cleansing - one wound ?X- 2 5 ?Complex Wound  Cleansing - multiple wounds ?X- 1 5 ?Wound Imaging (photographs - any number of wounds) ?[]  - 0 ?Wound Tracing (instead of photographs) ?[]  - 0 ?Simple Wound Measurement - one wound ?X- 2 5 ?Complex  Wound Measurement - multiple wounds ?INTERVENTIONS - Wound Dressings ?X - Small Wound Dressing one or multiple wounds 1 10 ?[]  - 0 ?Medium Wound Dressing one or multiple wounds ?[]  - 0 ?Large Wound Dressing one or multiple wounds ?X- 1 5 ?Application of Medications - topical ?[]  - 0 ?Application of Medications - injection ?INTERVENTIONS - Miscellaneous ?[]  - External ear exam 0 ?[]  - 0 ?Specimen Collection (cultures, biopsies, blood, body fluids, etc.) ?[]  - 0 ?Specimen(s) / Culture(s) sent or taken to Lab for analysis ?[]  - 0 ?Patient Transfer (multiple staff / / Similar devices) ?[]  - 0 ?Simple Staple / Suture removal (25 or less) ?[]  - 0 ?Complex Staple / Suture removal (26 or more) ?[]  - 0 ?Hypo / Hyperglycemic Management (close monitor of Blood Glucose) ?[]  - 0 ?Ankle / Brachial Index (ABI) - do not check if billed separately ?X- 1 5 ?Vital Signs ?Has the patient been seen at the hospital within the last three years: Yes ?Total Score: 105 ?Level Of Care: New/Established - Level ?3 ?Electronic Signature(s) ?Signed: 09/22/2021 2:19:31 PM By: ?Entered By on 09/22/2021 12:02:44 ?Randall Vincent, Randall Vincent ( ) ?-------------------------------------------------------------------------------- ?Encounter Discharge Information Details ?Patient Name: Randall Vincent, Randall Vincent ?Date of Service: 09/22/2021 11:30 AM ?Medical Record Number: ?Patient Account Number: ?Date of Birth/Sex: 1940/07/09 (81 y.o. M) ?Treating RN: Hansel Feinstein ?Primary Care Estle Huguley: Hansel Feinstein Other Clinician: ?Referring Kerah Hardebeck: 11/22/2021 ?Treating Derin Matthes/Extender: [name] ?Weeks in Treatment: 2 ?Encounter Discharge Information Items Post Procedure Vitals ?Discharge Condition: Stable ?Temperature  (?F): 97.6 ?Ambulatory Status: [id number] ?Pulse (bpm): 60 ?Discharge Destination: Home ?Respiratory Rate (breaths/min): 16 ?Transportation: Private Auto ?Blood Pressure (mmHg): 150/67 ?Accompanied By: son ?Schedule Follow-up Appointment: Yes ?Clinical Summary of Care: ?Electronic Signature(s) ?Signed: 09/22/2021 2:19:31 PM By: 11/22/2021 ?Entered [id number] on 09/22/2021 12:11:01 ?Randall Vincent, Randall Vincent ((65) ?-------------------------------------------------------------------------------- ?Lower Extremity Assessment Details ?Patient Name: Randall Vincent, Randall Vincent ?Date of Service: 09/22/2021 11:30 AM ?Medical Record Number: Patrina Levering ?Patient Account Number: Geralyn Corwin ?Date of Birth/Sex: Oct 18, 1940 (81 y.o. M) ?Treating RN: Hansel Feinstein ?Primary Care Caliegh Middlekauff: Hansel Feinstein Other Clinician: ?Referring Clydie Dillen: 11/22/2021 ?Treating Marian Meneely/Extender: [name] ?Weeks in Treatment: 2 ?Electronic Signature(s) ?Signed: 09/22/2021 2:19:31 PM By: [name] ?Entered By7/07/2021 on 09/22/2021 11:42:24 ?Randall Vincent, Randall Vincent (02/24/1941) ?-------------------------------------------------------------------------------- ?Multi Wound Chart Details ?Patient Name: Randall Vincent, Randall Vincent ?Date of Service: 09/22/2021 11:30 AM ?Medical Record Number: Patrina Levering ?Patient Account Number: Patrina Levering ?Date of Birth/Sex: [birth year] (81 y.o. 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

## 2021-09-25 DIAGNOSIS — N1832 Chronic kidney disease, stage 3b: Secondary | ICD-10-CM | POA: Insufficient documentation

## 2021-09-25 DIAGNOSIS — R54 Age-related physical debility: Secondary | ICD-10-CM | POA: Insufficient documentation

## 2021-09-29 ENCOUNTER — Encounter (HOSPITAL_BASED_OUTPATIENT_CLINIC_OR_DEPARTMENT_OTHER): Payer: Medicare Other | Admitting: Internal Medicine

## 2021-09-29 DIAGNOSIS — L98492 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d: Secondary | ICD-10-CM

## 2021-09-29 DIAGNOSIS — S40811A Abrasion of right upper arm, initial encounter: Secondary | ICD-10-CM

## 2021-09-29 DIAGNOSIS — E11622 Type 2 diabetes mellitus with other skin ulcer: Secondary | ICD-10-CM | POA: Diagnosis not present

## 2021-09-29 DIAGNOSIS — S41101A Unspecified open wound of right upper arm, initial encounter: Secondary | ICD-10-CM

## 2021-09-29 NOTE — Progress Notes (Signed)
DENSIL, OTTEY ([id number]) ?Visit Report for 09/29/2021 ?Arrival Information Details ?Patient Name: Randall Vincent, Randall Vincent ?Date of Service: 09/29/2021 8:00 AM ?Medical Record Number: [id number] ?Patient Account Number: [id number] ?Date of Birth/Sex: 09-Nov-1940 (81 y.o. M) ?Treating RN: Yevonne Pax ?Primary Care Morrell Fluke: Patrina Levering Other Clinician: ?Referring Oluwatobiloba Martin: Patrina Levering ?Treating Larwence Tu/Extender: Geralyn Corwin ?Weeks in Treatment: 3 ?Visit Information History Since Last Visit ?All ordered tests and consults were completed: No ?Patient Arrived: Ambulatory ?Added or deleted any medications: Yes ?Arrival Time: 08:05 ?Any new allergies or adverse reactions: No ?Accompanied By: self ?Had a fall or experienced change in No ?Transfer Assistance: None ?activities of daily living that may affect ?Patient Identification Verified: Yes ?risk of falls: ?Secondary Verification Process Completed: Yes ?Signs or symptoms of abuse/neglect since last visito No ?Patient Requires Transmission-Based No ?Hospitalized since last visit: No ?Precautions: ?Implantable device outside of the clinic excluding No ?Patient Has Alerts: Yes ?cellular tissue based products placed in the center ?Patient Alerts: Patient on Blood ?since last visit: ?Thinner ?Has Dressing in Place as Prescribed: Yes ?Eliquis ?Pain Present Now: Yes ?Diabetic ?Electronic Signature(s) ?Signed: 09/29/2021 3:40:11 PM By: Yevonne Pax RN ?Entered By: Yevonne Pax on 09/29/2021 08:10:50 ?Randall Vincent, Randall Vincent ([id number]) ?-------------------------------------------------------------------------------- ?Clinic Level of Care Assessment Details ?Patient Name: Randall Vincent, Randall Vincent ?Date of Service: 09/29/2021 8:00 AM ?Medical Record Number: [id number] ?Patient Account Number: [id number] ?Date of Birth/Sex: 1940-10-30 (81 y.o. M) ?Treating RN: Yevonne Pax ?Primary Care Everlina Gotts: Patrina Levering Other Clinician: ?Referring Kyaire Gruenewald: Patrina Levering ?Treating Metha Kolasa/Extender: Geralyn Corwin ?Weeks in Treatment: 3 ?Clinic Level of Care Assessment Items ?TOOL 1 Quantity Score ?[]  - Use when EandM and Procedure is performed on INITIAL visit 0 ?ASSESSMENTS - Nursing Assessment / Reassessment ?[]  - General Physical Exam (combine w/ comprehensive assessment (listed just below) when performed on new ?0 ?pt. evals) ?[]  - 0 ?Comprehensive Assessment (HX, ROS, Risk Assessments, Wounds Hx, etc.) ?ASSESSMENTS - Wound and Skin Assessment / Reassessment ?[]  - Dermatologic / Skin Assessment (not related to wound area) 0 ?ASSESSMENTS - Ostomy and/or Continence Assessment and Care ?[]  - Incontinence Assessment and Management 0 ?[]  - 0 ?Ostomy Care Assessment and Management (repouching, etc.) ?PROCESS - Coordination of Care ?[]  - Simple Patient / Family Education for ongoing care 0 ?[]  - 0 ?Complex (extensive) Patient / Family Education for ongoing care ?[]  - 0 ?Staff obtains Consents, Records, Test Results / Process Orders ?[]  - 0 ?Staff telephones HHA, Nursing Homes / Clarify orders / etc ?[]  - 0 ?Routine Transfer to another Facility (non-emergent condition) ?[]  - 0 ?Routine Hospital Admission (non-emergent condition) ?[]  - 0 ?New Admissions / / Ordering NPWT, Apligraf, etc. ?[]  - 0 ?Emergency Hospital Admission (emergent condition) ?PROCESS - Special Needs ?[]  - Pediatric / Minor Patient Management 0 ?[]  - 0 ?Isolation Patient Management ?[]  - 0 ?Hearing / Language / Visual special needs ?[]  - 0 ?Assessment of Community assistance (transportation, D/C planning, etc.) ?[]  - 0 ?Additional assistance / Altered mentation ?[]  - 0 ?Support Surface(s) Assessment (bed, cushion, seat, etc.) ?INTERVENTIONS - Miscellaneous ?[]  - External ear exam 0 ?[]  - 0 ?Patient Transfer (multiple staff / / Similar devices) ?[]  - 0 ?Simple Staple / Suture removal (25 or less) ?[]  - 0 ?Complex Staple / Suture removal (26 or more) ?[]  - 0 ?Hypo/Hyperglycemic Management (do not check if billed  separately) ?[]  - 0 ?Ankle / Brachial Index (ABI) - do not check if billed separately ?Has the patient been seen at the hospital within the last three years: Yes ?  Total Score: 0 ?Level Of Care: ____ ?Randall Vincent, Randall Vincent ([id number]) ?Electronic Signature(s) ?Signed: 09/29/2021 3:40:11 PM By: Yevonne Pax RN ?Entered By: Yevonne Pax on 09/29/2021 08:45:13 ?Randall Vincent, Randall Vincent ([id number]) ?-------------------------------------------------------------------------------- ?Encounter Discharge Information Details ?Patient Name: Randall Vincent, Randall Vincent ?Date of Service: 09/29/2021 8:00 AM ?Medical Record Number: [id number] ?Patient Account Number: [id number] ?Date of Birth/Sex: 10-11-1940 (81 y.o. M) ?Treating RN: Yevonne Pax ?Primary Care Camri Molloy: Patrina Levering Other Clinician: ?Referring Jurrell Royster: Patrina Levering ?Treating Orchid Glassberg/Extender: Geralyn Corwin ?Weeks in Treatment: 3 ?Encounter Discharge Information Items Post Procedure Vitals ?Discharge Condition: Stable ?Temperature (?F): 97.9 ?Ambulatory Status: Ambulatory ?Pulse (bpm): 83 ?Discharge Destination: Home ?Respiratory Rate (breaths/min): 18 ?Transportation: Private Auto ?Blood Pressure (mmHg): 155/75 ?Accompanied By: self ?Schedule Follow-up Appointment: Yes ?Clinical Summary of Care: Patient Declined ?Electronic Signature(s) ?Signed: 09/29/2021 3:40:11 PM By: Yevonne Pax RN ?Entered By: Yevonne Pax on 09/29/2021 08:46:30 ?Randall Vincent, Randall Vincent ([id number]) ?-------------------------------------------------------------------------------- ?Lower Extremity Assessment Details ?Patient Name: Randall Vincent, Randall Vincent ?Date of Service: 09/29/2021 8:00 AM ?Medical Record Number: [id number] ?Patient Account Number: [id number] ?Date of Birth/Sex: October 31, 1940 (81 y.o. M) ?Treating RN: Yevonne Pax ?Primary Care Ramandeep Arington: Patrina Levering Other Clinician: ?Referring Zaven Klemens: Patrina Levering ?Treating Margeret Stachnik/Extender: Geralyn Corwin ?Weeks in Treatment: 3 ?Electronic Signature(s) ?Signed: 09/29/2021 3:40:11 PM By:  Yevonne Pax RN ?Entered ByYevonne Pax on 09/29/2021 08:24:52 ?Randall Vincent, Randall Vincent ([id number]) ?-------------------------------------------------------------------------------- ?Multi Wound Chart Details ?Patient Name: Randall Vincent, Randall Vincent ?Date of Service: 09/29/2021 8:00 AM ?Medical Record Number: [id number] ?Patient Account Number: [id number] ?Date of Birth/Sex: 1940-07-18 (80 y.o. 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, Coronary Artery Disease, ?Hypertension, Type II Diabetes ?Date Acquired: 09/21/2021 N/A N/A ?Weeks of Treatment: 1 N/A N/A ?Wound Status: Open N/A N/A ?Wound Recurrence: No N/A N/A ?Clustered Wound: Yes N/A N/A ?Measurements L x W x D (cm) 4x6.5x0.1 N/A N/A ?Area (cm?) : 20.42 N/A N/A ?Volume (cm?) : 2.042 N/A N/A ?% Reduction in Area: 13.30% N/A N/A ?% Reduction in Volume: 13.30% N/A N/A ?Starting Position 1 (o'clock): ?Ending Position 1 (o'clock): ?Undermining: Yes N/A N/A ?Classification: Full Thickness Without Exposed N/A N/A ?Support Structures ?Exudate Amount: Medium N/A N/A ?Exudate Type: Sanguinous N/A N/A ?Exudate Color: red N/A N/A ?Granulation Amount: Medium (34-66%) N/A N/A ?Granulation Quality: Red N/A N/A ?Necrotic Amount: Medium (34-66%) N/A N/A ?Exposed Structures: ?Fat Layer (Subcutaneous Tissue): N/A N/A ?Yes ?Fascia: No ?Tendon: No ?Muscle: No ?Joint: No ?Bone: No ?Epithelialization: Small (1-33%) N/A N/A ?Treatment Notes ?Electronic Signature(s) ?Signed: 09/29/2021  3:40:11 PM By: Yevonne Pax RN ?Randall Vincent, Randall Vincent ([id number]) ?Entered ByYevonne Pax on 09/29/2021 08:25:29 ?Randall Vincent, Randall Vincent ([id number]) ?--------------------------------------------------------------------------

## 2021-09-29 NOTE — Progress Notes (Signed)
CRIT, OBREMSKI ([id number]) ?Visit Report for 09/29/2021 ?Chief Complaint Document Details ?Patient Name: Randall Vincent, Randall Vincent ?Date of Service: 09/29/2021 8:00 AM ?Medical Record Number: [id number] ?Patient Account Number: [id number] ?Date of Birth/Sex: 10-Nov-1940 (81 y.o. M) ?Treating RN: Carlene Coria ?Primary Care Provider: Elio Forget Other Clinician: ?Referring Provider: Elio Forget ?Treating Provider/Extender: Kalman Shan ?Weeks in Treatment: 3 ?Information Obtained from: Patient ?Chief Complaint ?09/08/2021; wound to the right upper extremity due to trauma ?Electronic Signature(s) ?Signed: 09/29/2021 9:24:52 AM By: Kalman Shan DO ?Entered By: Kalman Shan on 09/29/2021 09:03:16 ?Randall Vincent, Randall Vincent ([id number]) ?-------------------------------------------------------------------------------- ?Debridement Details ?Patient Name: Randall Vincent, Randall Vincent ?Date of Service: 09/29/2021 8:00 AM ?Medical Record Number: [id number] ?Patient Account Number: [id number] ?Date of Birth/Sex: 1940-06-09 (81 y.o. M) ?Treating RN: Carlene Coria ?Primary Care Provider: Elio Forget Other Clinician: ?Referring Provider: Elio Forget ?Treating Provider/Extender: Kalman Shan ?Weeks in Treatment: 3 ?Debridement Performed for ?Wound #2 Right Forearm ?Assessment: ?Performed By: Physician Kalman Shan, MD ?Debridement Type: Debridement ?Level of Consciousness (Pre- ?Awake and Alert ?procedure): ?Pre-procedure Verification/Time Out ?Yes - 08:35 ?Taken: ?Start Time: 08:35 ?Total Area Debrided (L x W): 2 (cm) x 3.5 (cm) = 7 (cm?) ?Tissue and other material ?Viable, Non-Viable, [place], Subcutaneous, [place] ?debrided: ?Level: Skin/Subcutaneous Tissue ?Debridement Description: Excisional ?Instrument: Curette ?Bleeding: Minimum ?Hemostasis Achieved: Pressure ?End Time: 08:42 ?Procedural Pain: 0 ?Post Procedural Pain: 0 ?Response to Treatment: Procedure was tolerated well ?Level of Consciousness (Post- ?Awake and Alert ?procedure): ?Post  Debridement Measurements of Total Wound ?Length: (cm) 4 ?Width: (cm) 6.5 ?Depth: (cm) 0.1 ?Volume: (cm?) 2.042 ?Character of Wound/Ulcer Post Debridement: Improved ?Post Procedure Diagnosis ?Same as Pre-procedure ?Notes ?Dr Heber  removed multiple running sutures without difficulty ?Electronic Signature(s) ?Signed: 09/29/2021 11:42:00 AM By: Carlene Coria RN ?Signed: 09/29/2021 12:25:41 PM By: Kalman Shan DO ?Previous Signature: 09/29/2021 9:24:52 AM Version By: Kalman Shan DO ?Entered By: Carlene Coria on 09/29/2021 11:42:00 ?Randall Vincent, Randall Vincent ([id number]) ?-------------------------------------------------------------------------------- ?HPI Details ?Patient Name: Randall Vincent, Randall Vincent ?Date of Service: 09/29/2021 8:00 AM ?Medical Record Number: [id number] ?Patient Account Number: [id number] ?Date of Birth/Sex: 10-20-1940 (81 y.o. M) ?Treating RN: Carlene Coria ?Primary Care Provider: Elio Forget Other Clinician: ?Referring Provider: Elio Forget ?Treating Provider/Extender: Kalman Shan ?Weeks in Treatment: 3 ?History of Present Illness ?HPI Description: Admission 09/08/2021 ?Mr. Kazumi Lachney is a 81 year old male with a past medical history of congestive heart failure, CKD stage III, type 2 diabetes currently ?controlled on oral medications and coronary artery disease status post CABG x2 that presents the clinic for a wound on his right arm for the past ?two weeks. He states he was walking his dog and Got tripped over the leash and fell creating an abrasion to his right arm. He states he visited ?urgent care and they helped stop the bleeding. He has not been using any wound care dressings but has been keeping the area covered with a ?Band-Aid. He currently denies signs of infection. ?4/26; patient presents for follow-up. He has been using Xeroform dressings without issues. He reports improvement in wound healing. He has no ?issues or complaints today. ?5/3; patient presents for follow-up. He has been using Xeroform  without issues. Unfortunately he fell yesterday and developed a skin tear to his ?right upper extremity. He visited the ED for this issue. They placed 50 sutures. He was started on Keflex. ?5/10; patient presents for follow-up. He has Xeroform on the wound bed today. We discussed using antibiotic ointment however I am not sure if ?he has been doing this. He has no issues or complaints today. He denies signs of infection. ?  Electronic Signature(s) ?Signed: 09/29/2021 9:24:52 AM By: Kalman Shan DO ?Entered By: Kalman Shan on 09/29/2021 09:04:20 ?Randall Vincent, Randall Vincent ([id number]) ?-------------------------------------------------------------------------------- ?Physical Exam Details ?Patient Name: Randall Vincent, Randall Vincent ?Date of Service: 09/29/2021 8:00 AM ?Medical Record Number: [id number] ?Patient Account Number: [id number] ?Date of Birth/Sex: 1941-03-29 (81 y.o. M) ?Treating RN: Carlene Coria ?Primary Care Provider: Elio Forget Other Clinician: ?Referring Provider: Elio Forget ?Treating Provider/Extender: Kalman Shan ?Weeks in Treatment: 3 ?Constitutional ?. ?Psychiatric ?[name] ?Notes ?Right upper extremity: To the anterior aspect there is a skin tear held together by running sutures. Edges are not approximated. Sutures were ?removed. There is subcutaneous fat, nonviable tissue and granulation tissue present. No surrounding signs of infection. ?Electronic Signature(s) ?Signed: 09/29/2021 9:24:52 AM By: Kalman Shan DO ?Entered By: Kalman Shan on 09/29/2021 09:05:28 ?Randall Vincent, Randall Vincent ([id number]) ?-------------------------------------------------------------------------------- ?Physician Orders Details ?Patient Name: Randall Vincent, Randall Vincent ?Date of Service: 09/29/2021 8:00 AM ?Medical Record Number: [id number] ?Patient Account Number: [id number] ?Date of Birth/Sex: 16-Jul-1940 (81 y.o. M) ?Treating RN: Carlene Coria ?Primary Care Provider: Elio Forget Other Clinician: ?Referring Provider: Elio Forget ?Treating  Provider/Extender: Kalman Shan ?Weeks in Treatment: 3 ?Verbal / Phone Orders: No ?Diagnosis Coding ?Follow-up Appointments ?o Return Appointment in 1 week. ?Bathing/ Shower/ Hygiene ?o Clean wound with Normal Saline or wound cleanser. ?Anesthetic (Use 'Patient Medications' Section for Anesthetic Order Entry) ?o Lidocaine applied to wound bed ?Wound Treatment ?Wound #2 - Forearm Wound Laterality: Right ?Cleanser: [employer] Ancillary Kit - 15 Day Supply (DME) (Generic) 1 x Per Day/30 Days ?Discharge Instructions: Use supplies as instructed; Kit contains: (15) Saline Bullets; (15) 3x3 Gauze; 15 pr Gloves ?Cleanser: Normal Saline 1 x Per Day/30 Days ?Discharge Instructions: Wash your hands with soap and water. Remove old dressing, discard into plastic bag and place into trash. ?Cleanse the wound with Normal Saline prior to applying a clean dressing using gauze sponges, not tissues or cotton balls. Do not scrub ?or use excessive force. Pat dry using gauze sponges, not tissue or cotton balls. ?Topical: Mupirocin Ointment 1 x Per Day/30 Days ?Discharge Instructions: Apply as directed by provider. ?Primary Dressing: Xeroform 4x4-HBD (in/in) (DME) (Dispense As Written) 1 x Per Day/30 Days ?Discharge Instructions: Apply Xeroform 4x4-HBD (in/in) as directed ?Secondary Dressing: ABD Pad 5x9 (in/in) (DME) (Generic) 1 x Per Day/30 Days ?Discharge Instructions: Cover with ABD pad ?Secondary Dressing: Kerlix 4.5 x 4.1 (in/yd) (DME) (Generic) 1 x Per Day/30 Days ?Discharge Instructions: Apply Kerlix 4.5 x 4.1 (in/yd) as instructed ?Secured With: Medipore Tape - 57M Medipore H Soft Cloth Surgical Tape, 2x2 (in/yd) (DME) (Generic) 1 x Per Day/30 Days ?Discharge Instructions: NO tape on skin ?Secured With: [employer], Size 4, 10 (yds) 1 x Per Day/30 Days ?Electronic Signature(s) ?Signed: 09/29/2021 9:24:52 AM By: Kalman Shan DO ?Entered By: Kalman Shan on 09/29/2021 09:08:15 ?Randall Vincent, Randall Vincent  ([id number]) ?-------------------------------------------------------------------------------- ?Problem List Details ?Patient Name: KOL, CONSUEGRA ?Date of Service: 09/29/2021 8:00 AM ?Medical Record Number: [id number] ?Patient Account Number: [id number] ?

## 2021-09-30 ENCOUNTER — Ambulatory Visit: Payer: Medicare Other | Admitting: Family
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
## 2021-10-04 ENCOUNTER — Ambulatory Visit (INDEPENDENT_AMBULATORY_CARE_PROVIDER_SITE_OTHER): Payer: Medicare Other | Admitting: Ophthalmology

## 2021-10-04 ENCOUNTER — Encounter (INDEPENDENT_AMBULATORY_CARE_PROVIDER_SITE_OTHER): Payer: Self-pay | Admitting: Ophthalmology

## 2021-10-04 DIAGNOSIS — H353211 Exudative age-related macular degeneration, right eye, with active choroidal neovascularization: Secondary | ICD-10-CM | POA: Diagnosis not present

## 2021-10-04 DIAGNOSIS — H50112 Monocular exotropia, left eye: Secondary | ICD-10-CM

## 2021-10-04 DIAGNOSIS — I10 Essential (primary) hypertension: Secondary | ICD-10-CM | POA: Diagnosis not present

## 2021-10-04 DIAGNOSIS — H35033 Hypertensive retinopathy, bilateral: Secondary | ICD-10-CM

## 2021-10-04 DIAGNOSIS — E119 Type 2 diabetes mellitus without complications: Secondary | ICD-10-CM

## 2021-10-04 DIAGNOSIS — H25811 Combined forms of age-related cataract, right eye: Secondary | ICD-10-CM

## 2021-10-04 DIAGNOSIS — H26122 Partially resolved traumatic cataract, left eye: Secondary | ICD-10-CM

## 2021-10-04 DIAGNOSIS — H5452A1 Low vision left eye category 1, normal vision right eye: Secondary | ICD-10-CM | POA: Diagnosis not present

## 2021-10-04 MED ORDER — BEVACIZUMAB CHEMO INJECTION 1.25MG/0.05ML SYRINGE FOR KALEIDOSCOPE
1.2500 mg | INTRAVITREAL | Status: AC | PRN
  Administered 2021-10-04: 1.25 mg via INTRAVITREAL

## 2021-10-06 ENCOUNTER — Encounter (HOSPITAL_BASED_OUTPATIENT_CLINIC_OR_DEPARTMENT_OTHER): Payer: Medicare Other | Admitting: Internal Medicine

## 2021-10-06 DIAGNOSIS — E11622 Type 2 diabetes mellitus with other skin ulcer: Secondary | ICD-10-CM | POA: Diagnosis not present

## 2021-10-06 DIAGNOSIS — L98492 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d: Secondary | ICD-10-CM

## 2021-10-06 DIAGNOSIS — S41101A Unspecified open wound of right upper arm, initial encounter: Secondary | ICD-10-CM | POA: Diagnosis not present

## 2021-10-06 NOTE — Progress Notes (Addendum)
YAZEN, ROSKO ([id number]) Visit Report for 10/06/2021 Arrival Information Details Patient Name: Randall Vincent, Randall Vincent Date of Service: 10/06/2021 8:00 AM Medical Record Number: [id number] Patient Account Number: [id number] Date of Birth/Sex: June 07, 1940 (80 y.o. M) Treating RN: Carlene Coria Primary Care Shantinique Picazo: Elio Forget Other Clinician: Referring Laetitia Schnepf: Elio Forget Treating Waseem Suess/Extender: Yaakov Guthrie in Treatment: 4 Visit Information History Since Last Visit All ordered tests and consults were completed: No Patient Arrived: Gilford Rile Added or deleted any medications: No Arrival Time: 08:02 Any new allergies or adverse reactions: No Accompanied By: self Had a fall or experienced change in No Transfer Assistance: None activities of daily living that may affect Patient Identification Verified: Yes risk of falls: Secondary Verification Process Completed: Yes Signs or symptoms of abuse/neglect since last visito No Patient Requires Transmission-Based No Hospitalized since last visit: No Precautions: Implantable device outside of the clinic excluding No Patient Has Alerts: Yes cellular tissue based products placed in the center Patient Alerts: Patient on Blood since last visit: Thinner Has Dressing in Place as Prescribed: Yes Eliquis Pain Present Now: No Diabetic Electronic Signature(s) Signed: 10/06/2021 8:19:52 AM By: Carlene Coria RN Entered By: Carlene Coria on 10/06/2021 08:06:35 Randall Vincent ([id number]) -------------------------------------------------------------------------------- Clinic Level of Care Assessment Details Patient Name: Randall Vincent Date of Service: 10/06/2021 8:00 AM Medical Record Number: [id number] Patient Account Number: [id number] Date of Birth/Sex: May 14, 1941 (80 y.o. M) Treating RN: Carlene Coria Primary Care Avin Upperman: Elio Forget Other Clinician: Referring Colden Samaras: Elio Forget Treating Draedyn Weidinger/Extender: Yaakov Guthrie in Treatment: 4 Clinic Level of Care Assessment Items TOOL 1 Quantity Score '[]'  - Use when EandM and Procedure is performed on INITIAL visit 0 ASSESSMENTS - Nursing Assessment / Reassessment '[]'  - General Physical Exam (combine w/ comprehensive assessment (listed just below) when performed on new 0 pt. evals) '[]'  - 0 Comprehensive Assessment (HX, ROS, Risk Assessments, Wounds Hx, etc.) ASSESSMENTS - Wound and Skin Assessment / Reassessment '[]'  - Dermatologic / Skin Assessment (not related to wound area) 0 ASSESSMENTS - Ostomy and/or Continence Assessment and Care '[]'  - Incontinence Assessment and Management 0 '[]'  - 0 Ostomy Care Assessment and Management (repouching, etc.) PROCESS - Coordination of Care '[]'  - Simple Patient / Family Education for ongoing care 0 '[]'  - 0 Complex (extensive) Patient / Family Education for ongoing care '[]'  - 0 Staff obtains Programmer, systems, Records, Test Results / Process Orders '[]'  - 0 Staff telephones HHA, Nursing Homes / Clarify orders / etc '[]'  - 0 Routine Transfer to another Facility (non-emergent condition) '[]'  - 0 Routine Hospital Admission (non-emergent condition) '[]'  - 0 New Admissions / Biomedical engineer / Ordering NPWT, Apligraf, etc. '[]'  - 0 Emergency Hospital Admission (emergent condition) PROCESS - Special Needs '[]'  - Pediatric / Minor Patient Management 0 '[]'  - 0 Isolation Patient Management '[]'  - 0 Hearing / Language / Visual special needs '[]'  - 0 Assessment of Community assistance (transportation, D/C planning, etc.) '[]'  - 0 Additional assistance / Altered mentation '[]'  - 0 Support Surface(s) Assessment (bed, cushion, seat, etc.) INTERVENTIONS - Miscellaneous '[]'  - External ear exam 0 '[]'  - 0 Patient Transfer (multiple staff / Civil Service fast streamer / Similar devices) '[]'  - 0 Simple Staple / Suture removal (25 or less) '[]'  - 0 Complex Staple / Suture removal (26 or more) '[]'  - 0 Hypo/Hyperglycemic Management (do not check if billed  separately) '[]'  - 0 Ankle / Brachial Index (ABI) - do not check if billed separately Has the patient been seen at the hospital within the last three years: Yes  Total Score: 0 Level Of Care: ____ Randall Vincent ([id number]) Electronic Signature(s) Signed: 10/07/2021 4:24:09 PM By: Carlene Coria RN Entered By: Carlene Coria on 10/06/2021 08:33:31 Randall Vincent ([id number]) -------------------------------------------------------------------------------- Encounter Discharge Information Details Patient Name: Randall Vincent Date of Service: 10/06/2021 8:00 AM Medical Record Number: [id number] Patient Account Number: [id number] Date of Birth/Sex: February 14, 1941 (80 y.o. M) Treating RN: Carlene Coria Primary Care Lakeem Rozo: Elio Forget Other Clinician: Referring Miriah Maruyama: Elio Forget Treating Kamika Goodloe/Extender: Yaakov Guthrie in Treatment: 4 Encounter Discharge Information Items Post Procedure Vitals Discharge Condition: Stable Temperature (F): 98.2 Ambulatory Status: Walker Pulse (bpm): 61 Discharge Destination: Home Respiratory Rate (breaths/min): 16 Transportation: Private Auto Blood Pressure (mmHg): 145/67 Accompanied By: self Schedule Follow-up Appointment: Yes Clinical Summary of Care: Patient Declined Electronic Signature(s) Signed: 10/07/2021 4:24:09 PM By: Carlene Coria RN Entered By: Carlene Coria on 10/06/2021 08:41:53 Randall Vincent ([id number]) -------------------------------------------------------------------------------- Lower Extremity Assessment Details Patient Name: Randall Vincent Date of Service: 10/06/2021 8:00 AM Medical Record Number: [id number] Patient Account Number: [id number] Date of Birth/Sex: 1941-01-22 (80 y.o. M) Treating RN: Carlene Coria Primary Care Chermaine Schnyder: Elio Forget Other Clinician: Referring Nita Whitmire: Elio Forget Treating Brandee Markin/Extender: Yaakov Guthrie in Treatment: 4 Electronic Signature(s) Signed: 10/06/2021 8:19:52 AM By:  Carlene Coria RN Entered By: Carlene Coria on 10/06/2021 08:11:29 Randall Vincent ([id number]) -------------------------------------------------------------------------------- Multi Wound Chart Details Patient Name: Randall Vincent Date of Service: 10/06/2021 8:00 AM Medical Record Number: [id number] Patient Account Number: [id number] Date of Birth/Sex: January 16, 1941 (80 y.o. M) Treating RN: Carlene Coria Primary Care Shanna Un: Elio Forget Other Clinician: Referring Allyne Hebert: Elio Forget Treating Allissa Albright/Extender: Yaakov Guthrie in Treatment: 4 Vital Signs Height(in): 68 Pulse(bpm): 61 Weight(lbs): 210 Blood Pressure(mmHg): 145/67 Body Mass Index(BMI): 31.9 Temperature(F): 98.2 Respiratory Rate(breaths/min): 16 Photos: [N/A:N/A] Wound Location: Right Forearm N/A N/A Wounding Event: Skin Tear/Laceration N/A N/A Primary Etiology: Skin Tear N/A N/A Comorbid History: Arrhythmia, Congestive Heart N/A N/A Failure, Coronary Artery Disease, Hypertension, Type II Diabetes Date Acquired: 09/21/2021 N/A N/A Weeks of Treatment: 2 N/A N/A Wound Status: Open N/A N/A Wound Recurrence: No N/A N/A Clustered Wound: Yes N/A N/A Measurements L x W x D (cm) 3x4.5x0.1 N/A N/A Area (cm) : 10.603 N/A N/A Volume (cm) : 1.06 N/A N/A % Reduction in Area: 55.00% N/A N/A % Reduction in Volume: 55.00% N/A N/A Classification: Full Thickness Without Exposed N/A N/A Support Structures Exudate Amount: Medium N/A N/A Exudate Type: Serosanguineous N/A N/A Exudate Color: red, brown N/A N/A Granulation Amount: Small (1-33%) N/A N/A Granulation Quality: Red N/A N/A Necrotic Amount: Large (67-100%) N/A N/A Exposed Structures: Fat Layer (Subcutaneous Tissue): N/A N/A Yes Fascia: No Tendon: No Muscle: No Joint: No Bone: No Epithelialization: Small (1-33%) N/A N/A Treatment Notes Electronic Signature(s) Signed: 10/06/2021 8:19:52 AM By: Carlene Coria RN Entered By: Carlene Coria on 10/06/2021  08:11:39 Randall Vincent ([id number]) Randall Vincent, Randall Vincent ([id number]) -------------------------------------------------------------------------------- Multi-Disciplinary Care Plan Details Patient Name: Randall Vincent Date of Service: 10/06/2021 8:00 AM Medical Record Number: [id number] Patient Account Number: [id number] Date of Birth/Sex: 1940-11-30 (80 y.o. M) Treating RN: Carlene Coria Primary Care Blue Winther: Elio Forget Other Clinician: Referring Keiona Jenison: Elio Forget Treating Lindsy Cerullo/Extender: Yaakov Guthrie in Treatment: 4 Active Inactive Abuse / Safety / Falls / Self Care Management Nursing Diagnoses: History of Falls Impaired physical mobility Potential for falls Potential for injury related to falls Goals: Patient will not experience any injury related to falls Date Initiated: 09/22/2021 Target Resolution Date: 10/29/2021 Goal Status: Active Patient/caregiver will verbalize understanding of skin care regimen Date Initiated: 09/22/2021 Target Resolution Date:  10/29/2021 Goal Status: Active Interventions: Assess impairment of mobility on admission and as needed per policy Provide education on basic hygiene Provide education on fall prevention Treatment Activities: Education provided on Basic Hygiene : 09/22/2021 Notes: Electronic Signature(s) Signed: 10/06/2021 8:19:52 AM By: Carlene Coria RN Entered By: Carlene Coria on 10/06/2021 Randall Vincent, Randall Vincent ([id number]) -------------------------------------------------------------------------------- Pain Assessment Details Patient Name: Randall Vincent Date of Service: 10/06/2021 8:00 AM Medical Record Number: [id number] Patient Account Number: [id number] Date of Birth/Sex: Mar 02, 1941 (80 y.o. M) Treating RN: Carlene Coria Primary Care Adewale Pucillo: Elio Forget Other Clinician: Referring Janine Reller: Elio Forget Treating Abbi Mancini/Extender: Yaakov Guthrie in Treatment: 4 Active Problems Location of Pain Severity and  Description of Pain Patient Has Paino No Site Locations Pain Management and Medication Current Pain Management: Electronic Signature(s) Signed: 10/06/2021 8:19:52 AM By: Carlene Coria RN Entered By: Carlene Coria on 10/06/2021 08:07:56 Randall Vincent ([id number]) -------------------------------------------------------------------------------- Patient/Caregiver Education Details Patient Name: Randall Vincent Date of Service: 10/06/2021 8:00 AM Medical Record Number: [id number] Patient Account Number: [id number] Date of Birth/Gender: 1940/12/16 (80 y.o. M) Treating RN: Carlene Coria Primary Care Physician: Elio Forget Other Clinician: Referring Physician: Elio Forget Treating Physician/Extender: Yaakov Guthrie in Treatment: 4 Education Assessment Education Provided To: Patient Education Topics Provided Basic Hygiene: Methods: Explain/Verbal Responses: State content correctly Safety: Methods: Explain/Verbal Responses: State content correctly Electronic Signature(s) Signed: 10/07/2021 4:24:09 PM By: Carlene Coria RN Entered By: Carlene Coria on 10/06/2021 08:35:17 Randall Vincent ([id number]) -------------------------------------------------------------------------------- Wound Assessment Details Patient Name: Randall Vincent Date of Service: 10/06/2021 8:00 AM Medical Record Number: [id number] Patient Account Number: [id number] Date of Birth/Sex: 22-May-1941 (80 y.o. M) Treating RN: Carlene Coria Primary Care Anhad Sheeley: Elio Forget Other Clinician: Referring Hazeline Charnley: Elio Forget Treating Yarethzy Croak/Extender: Yaakov Guthrie in Treatment: 4 Wound Status Wound Number: 2 Primary Skin Tear Etiology: Wound Location: Right Forearm Wound Open Wounding Event: Skin Tear/Laceration Status: Date Acquired: 09/21/2021 Comorbid Arrhythmia, Congestive Heart Failure, Coronary Artery Weeks Of Treatment: 2 History: Disease, Hypertension, Type II Diabetes Clustered Wound:  Yes Photos Wound Measurements Length: (cm) 3 Width: (cm) 4.5 Depth: (cm) 0.1 Area: (cm) 10.603 Volume: (cm) 1.06 % Reduction in Area: 55% % Reduction in Volume: 55% Epithelialization: Small (1-33%) Tunneling: No Undermining: No Wound Description Classification: Full Thickness Without Exposed Support Structures Exudate Amount: Medium Exudate Type: Serosanguineous Exudate Color: red, brown Foul Odor After Cleansing: No Slough/Fibrino Yes Wound Bed Granulation Amount: Small (1-33%) Exposed Structure Granulation Quality: Red Fascia Exposed: No Necrotic Amount: Large (67-100%) Fat Layer (Subcutaneous Tissue) Exposed: Yes Necrotic Quality: Adherent Slough Tendon Exposed: No Muscle Exposed: No Joint Exposed: No Bone Exposed: No Treatment Notes Wound #2 (Forearm) Wound Laterality: Right Cleanser [place] Ancillary Kit - 15 Day Supply Discharge Instruction: Use supplies as instructed; Kit contains: (15) Saline Bullets; (15) 3x3 Gauze; 15 pr Gloves Normal Saline Discharge Instruction: Wash your hands with soap and water. Remove old dressing, discard into plastic bag and place into trash. Cleanse the wound with Normal Saline prior to applying a clean dressing using gauze sponges, not tissues or cotton balls. Do not Randall Vincent, Randall Vincent ([id number]) scrub or use excessive force. Pat dry using gauze sponges, not tissue or cotton balls. Peri-Wound Care Topical Mupirocin Ointment Discharge Instruction: Apply as directed by Lucillie Kiesel. Primary Dressing Secondary Dressing Bordered Gauze Sterile-HBD 4x4 (in/in) Discharge Instruction: Cover wound with Bordered Guaze Sterile as directed Secured With Stretch Net, Size 4, 10 (yds) Compression Wrap Compression Stockings Add-Ons Electronic Signature(s) Signed: 10/06/2021 8:19:52 AM By: Carlene Coria RN Entered By: Carlene Coria on 10/06/2021  08:11:08 Randall Vincent, Randall Vincent  ([id number]) -------------------------------------------------------------------------------- [place] Details Patient Name: Randall Vincent, Randall Vincent Date of Service: 10/06/2021 8:00 AM Medical Record Number: [id number] Patient Account Number: [id number] Date of Birth/Sex: 26-Feb-1941 (80 y.o. M) Treating RN: Carlene Coria Primary Care Karmina Zufall: Elio Forget Other Clinician: Referring Jerre Vandrunen: Elio Forget Treating Farida Mcreynolds/Extender: Yaakov Guthrie in Treatment: 4 Vital Signs Time Taken: 08:07 Temperature (F): 98.2 Height (in): 68 Pulse (bpm): 61 Weight (lbs): 210 Respiratory Rate (breaths/min): 16 Body Mass Index (BMI): 31.9 Blood Pressure (mmHg): 145/67 Reference Range: 80 - 120 mg / dl Electronic Signature(s) Signed: 10/06/2021 8:19:52 AM By: Carlene Coria RN Entered By: Carlene Coria on 10/06/2021 08:07:50

## 2021-10-08 NOTE — Progress Notes (Signed)
Randall Vincent ([id number]) Visit Report for 10/06/2021 Chief Complaint Document Details Patient Name: Randall Vincent, Randall Vincent Date of Service: 10/06/2021 8:00 AM Medical Record Number: [id number] Patient Account Number: [id number] Date of Birth/Sex: 1941-04-09 (80 y.o. M) Treating RN: Carlene Coria Primary Care Provider: Elio Forget Other Clinician: Referring Provider: Elio Forget Treating Provider/Extender: Yaakov Guthrie in Treatment: 4 Information Obtained from: Patient Chief Complaint 09/08/2021; wound to the right upper extremity due to trauma Electronic Signature(s) Signed: 10/06/2021 9:15:52 AM By: Kalman Shan DO Entered By: Kalman Shan on 10/06/2021 09:04:47 Randall Vincent ([id number]) -------------------------------------------------------------------------------- Debridement Details Patient Name: Randall Vincent Date of Service: 10/06/2021 8:00 AM Medical Record Number: [id number] Patient Account Number: [id number] Date of Birth/Sex: 1941-03-21 (80 y.o. M) Treating RN: Carlene Coria Primary Care Provider: Elio Forget Other Clinician: Referring Provider: Elio Forget Treating Provider/Extender: Yaakov Guthrie in Treatment: 4 Debridement Performed for Wound #2 Right Forearm Assessment: Performed By: Physician Kalman Shan, MD Debridement Type: Debridement Level of Consciousness (Pre- Awake and Alert procedure): Pre-procedure Verification/Time Out Yes - 08:30 Taken: Start Time: 08:30 Pain Control: Lidocaine 4% Topical Solution Total Area Debrided (L x W): 3 (cm) x 4.5 (cm) = 13.5 (cm) Tissue and other material Viable, Non-Viable, Slough, Subcutaneous, Slough debrided: Level: Skin/Subcutaneous Tissue Debridement Description: Excisional Instrument: Curette Bleeding: Minimum Hemostasis Achieved: Pressure End Time: 08:36 Procedural Pain: 0 Post Procedural Pain: 0 Response to Treatment: Procedure was tolerated well Level of Consciousness  (Post- Awake and Alert procedure): Post Debridement Measurements of Total Wound Length: (cm) 3 Width: (cm) 4.5 Depth: (cm) 0.1 Volume: (cm) 1.06 Character of Wound/Ulcer Post Debridement: Improved Post Procedure Diagnosis Same as Pre-procedure Electronic Signature(s) Signed: 10/06/2021 9:15:52 AM By: Kalman Shan DO Signed: 10/07/2021 4:24:09 PM By: Carlene Coria RN Entered By: Carlene Coria on 10/06/2021 08:34:45 Randall Vincent ([id number]) -------------------------------------------------------------------------------- HPI Details Patient Name: Randall Vincent Date of Service: 10/06/2021 8:00 AM Medical Record Number: [id number] Patient Account Number: [id number] Date of Birth/Sex: 07-01-1940 (80 y.o. M) Treating RN: Carlene Coria Primary Care Provider: Elio Forget Other Clinician: Referring Provider: Elio Forget Treating Provider/Extender: Yaakov Guthrie in Treatment: 4 History of Present Illness HPI Description: Admission 09/08/2021 [name] is a 81 year old male with a past medical history of congestive heart failure, CKD stage III, type 2 diabetes currently controlled on oral medications and coronary artery disease status post CABG x2 that presents the clinic for a wound on his right arm for the past two weeks. He states he was walking his dog and Got tripped over the leash and fell creating an abrasion to his right arm. He states he visited urgent care and they helped stop the bleeding. He has not been using any wound care dressings but has been keeping the area covered with a Band-Aid. He currently denies signs of infection. 4/26; patient presents for follow-up. He has been using Xeroform dressings without issues. He reports improvement in wound healing. He has no issues or complaints today. 5/3; patient presents for follow-up. He has been using Xeroform without issues. Unfortunately he fell yesterday and developed a skin tear to his right upper extremity.  He visited the ED for this issue. They placed 50 sutures. He was started on Keflex. 5/10; patient presents for follow-up. He has Xeroform on the wound bed today. We discussed using antibiotic ointment however I am not sure if he has been doing this. He has no issues or complaints today. He denies signs of infection. 5/17; patient presents for follow-up. He has been using mupirocin to the wound  bed. He is having trouble doing dressing changes with Xeroform. He denies signs of infection. Electronic Signature(s) Signed: 10/06/2021 9:15:52 AM By: Kalman Shan DO Entered By: Kalman Shan on 10/06/2021 09:13:50 Randall Vincent ([id number]) -------------------------------------------------------------------------------- Physical Exam Details Patient Name: Randall Vincent Date of Service: 10/06/2021 8:00 AM Medical Record Number: [id number] Patient Account Number: [id number] Date of Birth/Sex: 1940-08-06 (80 y.o. M) Treating RN: Carlene Coria Primary Care Provider: Elio Forget Other Clinician: Referring Provider: Elio Forget Treating Provider/Extender: Yaakov Guthrie in Treatment: 4 Constitutional . Cardiovascular . Psychiatric . Notes Right upper extremity: To the forearm there is an open wound with granulation tissue and nonviable tissue. No signs of surrounding infection. Electronic Signature(s) Signed: 10/06/2021 9:15:52 AM By: Kalman Shan DO Entered By: Kalman Shan on 10/06/2021 09:08:06 Randall Vincent ([id number]) -------------------------------------------------------------------------------- Physician Orders Details Patient Name: Randall Vincent Date of Service: 10/06/2021 8:00 AM Medical Record Number: [id number] Patient Account Number: [id number] Date of Birth/Sex: April 09, 1941 (80 y.o. M) Treating RN: Carlene Coria Primary Care Provider: Elio Forget Other Clinician: Referring Provider: Elio Forget Treating Provider/Extender: Yaakov Guthrie in  Treatment: 4 Verbal / Phone Orders: No Diagnosis Coding Follow-up Appointments o Return Appointment in 1 week. Bathing/ Shower/ Hygiene o Clean wound with Normal Saline or wound cleanser. Anesthetic (Use 'Patient Medications' Section for Anesthetic Order Entry) o Lidocaine applied to wound bed Wound Treatment Wound #2 - Forearm Wound Laterality: Right Cleanser: Byram Ancillary Kit - 15 Day Supply (Generic) 1 x Per Day/30 Days Discharge Instructions: Use supplies as instructed; Kit contains: (15) Saline Bullets; (15) 3x3 Gauze; 15 pr Gloves Cleanser: Normal Saline 1 x Per Day/30 Days Discharge Instructions: Wash your hands with soap and water. Remove old dressing, discard into plastic bag and place into trash. Cleanse the wound with Normal Saline prior to applying a clean dressing using gauze sponges, not tissues or cotton balls. Do not scrub or use excessive force. Pat dry using gauze sponges, not tissue or cotton balls. Topical: Mupirocin Ointment 1 x Per Day/30 Days Discharge Instructions: Apply as directed by provider. Secondary Dressing: Bordered Gauze Sterile-HBD 4x4 (in/in) 1 x Per Day/30 Days Discharge Instructions: Cover wound with Bordered Guaze Sterile as directed Secured With: Stretch Net, Size 4, 10 (yds) 1 x Per Day/30 Days Electronic Signature(s) Signed: 10/06/2021 9:15:52 AM By: Kalman Shan DO Entered By: Kalman Shan on 10/06/2021 09:15:09 Randall Vincent ([id number]) -------------------------------------------------------------------------------- Problem List Details Patient Name: Randall Vincent Date of Service: 10/06/2021 8:00 AM Medical Record Number: [id number] Patient Account Number: [id number] Date of Birth/Sex: [birth year] (80 y.o. M) Treating RN: Carlene Coria Primary Care Provider: Elio Forget Other Clinician: Referring Provider: Elio Forget Treating Provider/Extender: Yaakov Guthrie in Treatment: 4 Active  Problems ICD-10 Encounter Code Description Active Date MDM Diagnosis [phone] 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d 09/08/2021 No Yes S41.101A Unspecified open wound of right upper arm, initial encounter 09/08/2021 No Yes S40.811A Abrasion of right upper arm, initial encounter 09/08/2021 No Yes I25.10 Atherosclerotic heart disease of native coronary artery without angina 09/08/2021 No Yes pectoris E11.622 Type 2 diabetes mellitus with other skin ulcer 09/08/2021 No Yes N18.31 Chronic kidney disease, stage 3a 09/08/2021 No Yes I50.9 Heart failure, unspecified 09/08/2021 No Yes Inactive Problems Resolved Problems Electronic Signature(s) Signed: 10/06/2021 9:15:52 AM By: Kalman Shan DO Entered By: Kalman Shan on 10/06/2021 09:04:40 Randall Vincent ([id number]) -------------------------------------------------------------------------------- Progress Note Details Patient Name: Randall Vincent Date of Service: 10/06/2021 8:00 AM Medical Record Number: [id number] Patient Account Number: [id number] Date of Birth/Sex:  1940-10-06 (81 y.o. M) Treating RN: Carlene Coria Primary Care Provider: Elio Forget Other Clinician: Referring Provider: Elio Forget Treating Provider/Extender: Yaakov Guthrie in Treatment: 4 Subjective Chief Complaint Information obtained from Patient 09/08/2021; wound to the right upper extremity due to trauma History of Present Illness (HPI) Admission 09/08/2021 [name] is a 81 year old male with a past medical history of congestive heart failure, CKD stage III, type 2 diabetes currently controlled on oral medications and coronary artery disease status post CABG x2 that presents the clinic for a wound on his right arm for the past two weeks. He states he was walking his dog and Got tripped over the leash and fell creating an abrasion to his right arm. He states he visited urgent care and they helped stop the bleeding.  He has not been using any wound care dressings but has been keeping the area covered with a Band-Aid. He currently denies signs of infection. 4/26; patient presents for follow-up. He has been using Xeroform dressings without issues. He reports improvement in wound healing. He has no issues or complaints today. 5/3; patient presents for follow-up. He has been using Xeroform without issues. Unfortunately he fell yesterday and developed a skin tear to his right upper extremity. He visited the ED for this issue. They placed 50 sutures. He was started on Keflex. 5/10; patient presents for follow-up. He has Xeroform on the wound bed today. We discussed using antibiotic ointment however I am not sure if he has been doing this. He has no issues or complaints today. He denies signs of infection. 5/17; patient presents for follow-up. He has been using mupirocin to the wound bed. He is having trouble doing dressing changes with Xeroform. He denies signs of infection. Objective Constitutional Vitals Time Taken: 8:07 AM, Height: 68 in, Weight: 210 lbs, BMI: 31.9, Temperature: 98.2 F, Pulse: 61 bpm, Respiratory Rate: 16 breaths/min, Blood Pressure: 145/67 mmHg. General Notes: Right upper extremity: To the forearm there is an open wound with granulation tissue and nonviable tissue. No signs of surrounding infection. Integumentary (Hair, Skin) Wound #2 status is Open. Original cause of wound was Skin Tear/Laceration. The date acquired was: 09/21/2021. The wound has been in treatment 2 weeks. The wound is located on the Right Forearm. The wound measures 3cm length x 4.5cm width x 0.1cm depth; 10.603cm^2 area and 1.06cm^3 volume. There is Fat Layer (Subcutaneous Tissue) exposed. There is no tunneling or undermining noted. There is a medium amount of serosanguineous drainage noted. There is small (1-33%) red granulation within the wound bed. There is a large (67-100%) amount of necrotic tissue within the wound bed  including Adherent Slough. Assessment Active Problems ICD-10 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d Unspecified open wound of right upper arm, initial encounter Abrasion of right upper arm, initial encounter Atherosclerotic heart disease of native coronary artery without angina pectoris Randall Vincent, Randall Vincent ([id number]) Type 2 diabetes mellitus with other skin ulcer Chronic kidney disease, stage 3a Heart failure, unspecified Patient's wound has shown improvement in size and appearance since last clinic visit. More epithelization is occurring. I debrided nonviable tissue. He is having trouble putting on the xerofrom dressing. At this time I recommended just using mupirocin ointment daily. Follow up in one week. Procedures Wound #2 Pre-procedure diagnosis of Wound #2 is a Skin Tear located on the Right Forearm . There was a Excisional Skin/Subcutaneous Tissue Debridement with a total area of 13.5 sq cm performed by Kalman Shan, MD. With the  following instrument(s): Curette to remove Viable and Non-Viable tissue/material. Material removed includes Subcutaneous Tissue and Slough and after achieving pain control using Lidocaine 4% Topical Solution. A time out was conducted at 08:30, prior to the start of the procedure. A Minimum amount of bleeding was controlled with Pressure. The procedure was tolerated well with a pain level of 0 throughout and a pain level of 0 following the procedure. Post Debridement Measurements: 3cm length x 4.5cm width x 0.1cm depth; 1.06cm^3 volume. Character of Wound/Ulcer Post Debridement is improved. Post procedure Diagnosis Wound #2: Same as Pre-Procedure Plan Follow-up Appointments: Return Appointment in 1 week. Bathing/ Shower/ Hygiene: Clean wound with Normal Saline or wound cleanser. Anesthetic (Use 'Patient Medications' Section for Anesthetic Order Entry): Lidocaine applied to wound bed WOUND #2: - Forearm Wound  Laterality: Right Cleanser: Byram Ancillary Kit - 15 Day Supply (Generic) 1 x Per Day/30 Days Discharge Instructions: Use supplies as instructed; Kit contains: (15) Saline Bullets; (15) 3x3 Gauze; 15 pr Gloves Cleanser: Normal Saline 1 x Per Day/30 Days Discharge Instructions: Wash your hands with soap and water. Remove old dressing, discard into plastic bag and place into trash. Cleanse the wound with Normal Saline prior to applying a clean dressing using gauze sponges, not tissues or cotton balls. Do not scrub or use excessive force. Pat dry using gauze sponges, not tissue or cotton balls. Topical: Mupirocin Ointment 1 x Per Day/30 Days Discharge Instructions: Apply as directed by provider. Secondary Dressing: Bordered Gauze Sterile-HBD 4x4 (in/in) 1 x Per Day/30 Days Discharge Instructions: Cover wound with Bordered Guaze Sterile as directed Secured With: Stretch Net, Size 4, 10 (yds) 1 x Per Day/30 Days 1. In office sharp debridement 2. Mupirocin ointment daily 3. Follow-up in 1 week Electronic Signature(s) Signed: 10/06/2021 9:15:52 AM By: Kalman Shan DO Entered By: Kalman Shan on 10/06/2021 09:14:40 Randall Vincent ([id number]) -------------------------------------------------------------------------------- [place] Details Patient Name: Randall Vincent Date of Service: 10/06/2021 Medical Record Number: [id number] Patient Account Number: [id number] Date of Birth/Sex: [birth year] (80 y.o. M) Treating RN: Carlene Coria Primary Care Provider: Elio Forget Other Clinician: Referring Provider: Elio Forget Treating Provider/Extender: Yaakov Guthrie in Treatment: 4 Diagnosis Coding ICD-10 Codes Code Description [phone] 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d S41.101A Unspecified open wound of right upper arm, initial encounter S40.811A Abrasion of right upper arm, initial encounter I25.10 Atherosclerotic heart disease of native  coronary artery without angina pectoris E11.622 Type 2 diabetes mellitus with other skin ulcer N18.31 Chronic kidney disease, stage 3a I50.9 Heart failure, unspecified Facility Procedures CPT4 Code: 54627035 Description: 00938 - DEB SUBQ TISSUE 20 SQ CM/< Modifier: Quantity: 1 CPT4 Code: Description: ICD-10 Diagnosis Description L98.492 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

## 2021-10-13 ENCOUNTER — Encounter (HOSPITAL_BASED_OUTPATIENT_CLINIC_OR_DEPARTMENT_OTHER): Payer: Medicare Other | Admitting: Internal Medicine

## 2021-10-13 DIAGNOSIS — S41101A Unspecified open wound of right upper arm, initial encounter: Secondary | ICD-10-CM | POA: Diagnosis not present

## 2021-10-13 DIAGNOSIS — L98492 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d: Secondary | ICD-10-CM

## 2021-10-13 DIAGNOSIS — S40811A Abrasion of right upper arm, initial encounter: Secondary | ICD-10-CM

## 2021-10-13 DIAGNOSIS — E11622 Type 2 diabetes mellitus with other skin ulcer: Secondary | ICD-10-CM | POA: Diagnosis not present

## 2021-10-14 NOTE — Progress Notes (Signed)
YAHYE, SIEBERT ([id number]) Visit Report for 10/13/2021 Arrival Information Details Patient Name: Randall Vincent, Randall Vincent Date of Service: 10/13/2021 12:30 PM Medical Record Number: [id number] Patient Account Number: [id number] Date of Birth/Sex: 27-Apr-1941 (81 y.o. M) Treating RN: Carlene Coria Primary Care Rajvi Armentor: Elio Forget Other Clinician: Referring Jonita Hirota: Elio Forget Treating Leonda Cristo/Extender: Yaakov Guthrie in Treatment: 5 Visit Information History Since Last Visit All ordered tests and consults were completed: No Patient Arrived: Gilford Rile Added or deleted any medications: No Arrival Time: 12:32 Any new allergies or adverse reactions: No Accompanied By: self Had a fall or experienced change in No Transfer Assistance: None activities of daily living that may affect Patient Identification Verified: Yes risk of falls: Secondary Verification Process Completed: Yes Signs or symptoms of abuse/neglect since last visito No Patient Requires Transmission-Based No Hospitalized since last visit: No Precautions: Implantable device outside of the clinic excluding No Patient Has Alerts: Yes cellular tissue based products placed in the center Patient Alerts: Patient on Blood since last visit: Thinner Has Dressing in Place as Prescribed: Yes Eliquis Pain Present Now: No Diabetic Electronic Signature(s) Signed: 10/14/2021 2:19:12 PM By: Carlene Coria RN Entered By: Carlene Coria on 10/13/2021 12:36:41 Randall Vincent ([id number]) -------------------------------------------------------------------------------- Clinic Level of Care Assessment Details Patient Name: Randall Vincent Date of Service: 10/13/2021 12:30 PM Medical Record Number: [id number] Patient Account Number: [id number] Date of Birth/Sex: [birth year] (80 y.o. M) Treating RN: Carlene Coria Primary Care Alvin Rubano: Elio Forget Other Clinician: Referring Tan Clopper: Elio Forget Treating Chelsia Serres/Extender: Yaakov Guthrie in Treatment: 5 Clinic Level of Care Assessment Items TOOL 1 Quantity Score '[]'  - Use when EandM and Procedure is performed on INITIAL visit 0 ASSESSMENTS - Nursing Assessment / Reassessment '[]'  - General Physical Exam (combine w/ comprehensive assessment (listed just below) when performed on new 0 pt. evals) '[]'  - 0 Comprehensive Assessment (HX, ROS, Risk Assessments, Wounds Hx, etc.) ASSESSMENTS - Wound and Skin Assessment / Reassessment '[]'  - Dermatologic / Skin Assessment (not related to wound area) 0 ASSESSMENTS - Ostomy and/or Continence Assessment and Care '[]'  - Incontinence Assessment and Management 0 '[]'  - 0 Ostomy Care Assessment and Management (repouching, etc.) PROCESS - Coordination of Care '[]'  - Simple Patient / Family Education for ongoing care 0 '[]'  - 0 Complex (extensive) Patient / Family Education for ongoing care '[]'  - 0 Staff obtains Programmer, systems, Records, Test Results / Process Orders '[]'  - 0 Staff telephones HHA, Nursing Homes / Clarify orders / etc '[]'  - 0 Routine Transfer to another Facility (non-emergent condition) '[]'  - 0 Routine Hospital Admission (non-emergent condition) '[]'  - 0 New Admissions / Biomedical engineer / Ordering NPWT, Apligraf, etc. '[]'  - 0 Emergency Hospital Admission (emergent condition) PROCESS - Special Needs '[]'  - Pediatric / Minor Patient Management 0 '[]'  - 0 Isolation Patient Management '[]'  - 0 Hearing / Language / Visual special needs '[]'  - 0 Assessment of Community assistance (transportation, D/C planning, etc.) '[]'  - 0 Additional assistance / Altered mentation '[]'  - 0 Support Surface(s) Assessment (bed, cushion, seat, etc.) INTERVENTIONS - Miscellaneous '[]'  - External ear exam 0 '[]'  - 0 Patient Transfer (multiple staff / Civil Service fast streamer / Similar devices) '[]'  - 0 Simple Staple / Suture removal (25 or less) '[]'  - 0 Complex Staple / Suture removal (26 or more) '[]'  - 0 Hypo/Hyperglycemic Management (do not check if billed  separately) '[]'  - 0 Ankle / Brachial Index (ABI) - do not check if billed separately Has the patient been seen at the hospital within the last three years: Yes  Total Score: 0 Level Of Care: ____ Randall Vincent ([id number]) Electronic Signature(s) Signed: 10/14/2021 2:19:12 PM By: Carlene Coria RN Entered By: Carlene Coria on 10/13/2021 13:03:32 Randall Vincent ([id number]) -------------------------------------------------------------------------------- Encounter Discharge Information Details Patient Name: Randall Vincent Date of Service: 10/13/2021 12:30 PM Medical Record Number: [id number] Patient Account Number: [id number] Date of Birth/Sex: [birth year] (80 y.o. M) Treating RN: Carlene Coria Primary Care Davaughn Hillyard: Elio Forget Other Clinician: Referring Indira Sorenson: Elio Forget Treating Mattox Schorr/Extender: Yaakov Guthrie in Treatment: 5 Encounter Discharge Information Items Post Procedure Vitals Discharge Condition: Stable Temperature (F): 97.8 Ambulatory Status: Walker Pulse (bpm): 60 Discharge Destination: Home Respiratory Rate (breaths/min): 18 Transportation: Private Auto Blood Pressure (mmHg): 130/60 Accompanied By: self Schedule Follow-up Appointment: Yes Clinical Summary of Care: Electronic Signature(s) Signed: 10/14/2021 2:19:12 PM By: Carlene Coria RN Entered By: Carlene Coria on 10/13/2021 13:04:52 Randall Vincent ([id number]) -------------------------------------------------------------------------------- Lower Extremity Assessment Details Patient Name: Randall Vincent Date of Service: 10/13/2021 12:30 PM Medical Record Number: [id number] Patient Account Number: [id number] Date of Birth/Sex: May 04, 1941 (80 y.o. M) Treating RN: Carlene Coria Primary Care Jaydence Vanyo: Elio Forget Other Clinician: Referring Deandrae Wajda: Elio Forget Treating Staphanie Harbison/Extender: Yaakov Guthrie in Treatment: 5 Electronic Signature(s) Signed: 10/14/2021 2:19:12 PM By: Carlene Coria  RN Entered By: Carlene Coria on 10/13/2021 12:41:03 Randall Vincent ([id number]) -------------------------------------------------------------------------------- Multi Wound Chart Details Patient Name: Randall Vincent Date of Service: 10/13/2021 12:30 PM Medical Record Number: [id number] Patient Account Number: [id number] Date of Birth/Sex: January 07, 1941 (80 y.o. M) Treating RN: Carlene Coria Primary Care Balinda Heacock: Elio Forget Other Clinician: Referring Radek Carnero: Elio Forget Treating Neeko Pharo/Extender: Yaakov Guthrie in Treatment: 5 Vital Signs Height(in): 68 Pulse(bpm): 60 Weight(lbs): 210 Blood Pressure(mmHg): 130/60 Body Mass Index(BMI): 31.9 Temperature(F): 97.8 Respiratory Rate(breaths/min): 18 Photos: [N/A:N/A] Wound Location: Right Forearm N/A N/A Wounding Event: Skin Tear/Laceration N/A N/A Primary Etiology: Skin Tear N/A N/A Comorbid History: Arrhythmia, Congestive Heart N/A N/A Failure, Coronary Artery Disease, Hypertension, Type II Diabetes Date Acquired: 09/21/2021 N/A N/A Weeks of Treatment: 3 N/A N/A Wound Status: Open N/A N/A Wound Recurrence: No N/A N/A Clustered Wound: Yes N/A N/A Measurements L x W x D (cm) 2.1x2.3x0.2 N/A N/A Area (cm) : 3.793 N/A N/A Volume (cm) : 0.759 N/A N/A % Reduction in Area: 83.90% N/A N/A % Reduction in Volume: 67.80% N/A N/A Classification: Full Thickness Without Exposed N/A N/A Support Structures Exudate Amount: Medium N/A N/A Exudate Type: Serosanguineous N/A N/A Exudate Color: red, brown N/A N/A Granulation Amount: Medium (34-66%) N/A N/A Granulation Quality: Red N/A N/A Necrotic Amount: Medium (34-66%) N/A N/A Exposed Structures: Fat Layer (Subcutaneous Tissue): N/A N/A Yes Fascia: No Tendon: No Muscle: No Joint: No Bone: No Epithelialization: Small (1-33%) N/A N/A Treatment Notes Electronic Signature(s) Signed: 10/14/2021 2:19:12 PM By: Carlene Coria RN Entered By: Carlene Coria on 10/13/2021  12:41:13 Randall Vincent ([id number]) BRITTIN, JANIK ([id number]) -------------------------------------------------------------------------------- Multi-Disciplinary Care Plan Details Patient Name: Randall Vincent Date of Service: 10/13/2021 12:30 PM Medical Record Number: [id number] Patient Account Number: [id number] Date of Birth/Sex: 05-Oct-1940 (80 y.o. M) Treating RN: Carlene Coria Primary Care Kristiane Morsch: Elio Forget Other Clinician: Referring Jared Cahn: Elio Forget Treating Fausto Sampedro/Extender: Yaakov Guthrie in Treatment: 5 Active Inactive Abuse / Safety / Falls / Self Care Management Nursing Diagnoses: History of Falls Impaired physical mobility Potential for falls Potential for injury related to falls Goals: Patient will not experience any injury related to falls Date Initiated: 09/22/2021 Target Resolution Date: 10/29/2021 Goal Status: Active Patient/caregiver will verbalize understanding of skin care regimen Date Initiated: 09/22/2021 Target Resolution Date: 10/29/2021 Goal  Status: Active Interventions: Assess impairment of mobility on admission and as needed per policy Provide education on basic hygiene Provide education on fall prevention Treatment Activities: Education provided on Basic Hygiene : 10/06/2021 Notes: Electronic Signature(s) Signed: 10/14/2021 2:19:12 PM By: Carlene Coria RN Entered By: Carlene Coria on 10/13/2021 12:41:07 Randall Vincent ([id number]) -------------------------------------------------------------------------------- Pain Assessment Details Patient Name: Randall Vincent Date of Service: 10/13/2021 12:30 PM Medical Record Number: [id number] Patient Account Number: [id number] Date of Birth/Sex: Mar 03, 1941 (80 y.o. M) Treating RN: Carlene Coria Primary Care Lissett Favorite: Elio Forget Other Clinician: Referring Jerlean Peralta: Elio Forget Treating Stormy Connon/Extender: Yaakov Guthrie in Treatment: 5 Active Problems Location of Pain Severity and  Description of Pain Patient Has Paino No Site Locations Pain Management and Medication Current Pain Management: Electronic Signature(s) Signed: 10/14/2021 2:19:12 PM By: Carlene Coria RN Entered By: Carlene Coria on 10/13/2021 12:37:02 Randall Vincent ([id number]) -------------------------------------------------------------------------------- Patient/Caregiver Education Details Patient Name: Randall Vincent Date of Service: 10/13/2021 12:30 PM Medical Record Number: [id number] Patient Account Number: [id number] Date of Birth/Gender: 07-04-1940 (80 y.o. M) Treating RN: Carlene Coria Primary Care Physician: Elio Forget Other Clinician: Referring Physician: Elio Forget Treating Physician/Extender: Yaakov Guthrie in Treatment: 5 Education Assessment Education Provided To: Patient Education Topics Provided Safety: Methods: Explain/Verbal Responses: State content correctly Electronic Signature(s) Signed: 10/14/2021 2:19:12 PM By: Carlene Coria RN Entered By: Carlene Coria on 10/13/2021 13:03:43 Randall Vincent ([id number]) -------------------------------------------------------------------------------- Wound Assessment Details Patient Name: Randall Vincent Date of Service: 10/13/2021 12:30 PM Medical Record Number: [id number] Patient Account Number: [id number] Date of Birth/Sex: [birth year] (80 y.o. M) Treating RN: Carlene Coria Primary Care Mairin Lindsley: Elio Forget Other Clinician: Referring Balian Schaller: Elio Forget Treating Bryer Cozzolino/Extender: Yaakov Guthrie in Treatment: 5 Wound Status Wound Number: 2 Primary Skin Tear Etiology: Wound Location: Right Forearm Wound Open Wounding Event: Skin Tear/Laceration Status: Date Acquired: 09/21/2021 Comorbid Arrhythmia, Congestive Heart Failure, Coronary Artery Weeks Of Treatment: 3 History: Disease, Hypertension, Type II Diabetes Clustered Wound: Yes Photos Wound Measurements Length: (cm) 2.1 Width: (cm) 2.3 Depth: (cm)  0.2 Area: (cm) 3.793 Volume: (cm) 0.759 % Reduction in Area: 83.9% % Reduction in Volume: 67.8% Epithelialization: Small (1-33%) Tunneling: No Undermining: No Wound Description Classification: Full Thickness Without Exposed Support Structures Exudate Amount: Medium Exudate Type: Serosanguineous Exudate Color: red, brown Foul Odor After Cleansing: No Slough/Fibrino Yes Wound Bed Granulation Amount: Medium (34-66%) Exposed Structure Granulation Quality: Red Fascia Exposed: No Necrotic Amount: Medium (34-66%) Fat Layer (Subcutaneous Tissue) Exposed: Yes Necrotic Quality: Adherent Slough Tendon Exposed: No Muscle Exposed: No Joint Exposed: No Bone Exposed: No Treatment Notes Wound #2 (Forearm) Wound Laterality: Right Cleanser [place] Ancillary Kit - 15 Day Supply Discharge Instruction: Use supplies as instructed; Kit contains: (15) Saline Bullets; (15) 3x3 Gauze; 15 pr Gloves Normal Saline Discharge Instruction: Wash your hands with soap and water. Remove old dressing, discard into plastic bag and place into trash. Cleanse the wound with Normal Saline prior to applying a clean dressing using gauze sponges, not tissues or cotton balls. Do not JOVANN, LUSE ([id number]) scrub or use excessive force. Pat dry using gauze sponges, not tissue or cotton balls. Peri-Wound Care Topical Mupirocin Ointment Discharge Instruction: Apply as directed by Ariyanah Aguado. Primary Dressing Secondary Dressing Bordered Gauze Sterile-HBD 4x4 (in/in) Discharge Instruction: Cover wound with Bordered Guaze Sterile as directed Secured With Stretch Net, Size 4, 10 (yds) Compression Wrap Compression Stockings Add-Ons Electronic Signature(s) Signed: 10/14/2021 2:19:12 PM By: Carlene Coria RN Entered By: Carlene Coria on 10/13/2021 12:40:48 Randall Vincent ([id number]) -------------------------------------------------------------------------------- Vitals Details Patient Name: [place],  Yago Date of  Service: 10/13/2021 12:30 PM Medical Record Number: [id number] Patient Account Number: [id number] Date of Birth/Sex: 06-Jul-1940 (81 y.o. M) Treating RN: Carlene Coria Primary Care Linda Biehn: Elio Forget Other Clinician: Referring Olson Lucarelli: Elio Forget Treating Maysa Lynn/Extender: Yaakov Guthrie in Treatment: 5 Vital Signs Time Taken: 12:36 Temperature (F): 97.8 Height (in): 68 Pulse (bpm): 60 Weight (lbs): 210 Respiratory Rate (breaths/min): 18 Body Mass Index (BMI): 31.9 Blood Pressure (mmHg): 130/60 Reference Range: 80 - 120 mg / dl Electronic Signature(s) Signed: 10/14/2021 2:19:12 PM By: Carlene Coria RN Entered By: Carlene Coria on 10/13/2021 12:36:57

## 2021-10-14 NOTE — Progress Notes (Signed)
NANDAN, WILLEMS ([id number]) Visit Report for 10/13/2021 Chief Complaint Document Details Patient Name: Randall Vincent Date of Service: 10/13/2021 12:30 PM Medical Record Number: [id number] Patient Account Number: [id number] Date of Birth/Sex: 16-Oct-1940 (81 y.o. M) Treating RN: Carlene Coria Primary Care Provider: Elio Forget Other Clinician: Referring Provider: Elio Forget Treating Provider/Extender: Yaakov Guthrie in Treatment: 5 Information Obtained from: Patient Chief Complaint 09/08/2021; wound to the right upper extremity due to trauma Electronic Signature(s) Signed: 10/13/2021 2:19:48 PM By: Kalman Shan DO Entered By: Kalman Shan on 10/13/2021 13:08:26 Randall Vincent ([id number]) -------------------------------------------------------------------------------- Debridement Details Patient Name: Randall Vincent Date of Service: 10/13/2021 12:30 PM Medical Record Number: [id number] Patient Account Number: [id number] Date of Birth/Sex: 07/01/1940 (81 y.o. M) Treating RN: Carlene Coria Primary Care Provider: Elio Forget Other Clinician: Referring Provider: Elio Forget Treating Provider/Extender: Yaakov Guthrie in Treatment: 5 Debridement Performed for Wound #2 Right Forearm Assessment: Performed By: Physician Kalman Shan, MD Debridement Type: Debridement Level of Consciousness (Pre- Awake and Alert procedure): Pre-procedure Verification/Time Out Yes - 13:00 Taken: Start Time: 13:00 Total Area Debrided (L x W): 2.1 (cm) x 2.3 (cm) = 4.83 (cm) Tissue and other material Viable, Non-Viable, Slough, Subcutaneous, Slough debrided: Level: Skin/Subcutaneous Tissue Debridement Description: Excisional Instrument: Curette Bleeding: Minimum Hemostasis Achieved: Pressure End Time: 13:04 Procedural Pain: 0 Post Procedural Pain: 0 Response to Treatment: Procedure was tolerated well Level of Consciousness (Post- Awake and Alert procedure): Post  Debridement Measurements of Total Wound Length: (cm) 2.1 Width: (cm) 2.3 Depth: (cm) 0.2 Volume: (cm) 0.759 Character of Wound/Ulcer Post Debridement: Improved Post Procedure Diagnosis Same as Pre-procedure Electronic Signature(s) Signed: 10/13/2021 2:19:48 PM By: Kalman Shan DO Signed: 10/14/2021 2:19:12 PM By: Carlene Coria RN Entered By: Carlene Coria on 10/13/2021 13:03:23 Randall Vincent ([id number]) -------------------------------------------------------------------------------- HPI Details Patient Name: Randall Vincent Date of Service: 10/13/2021 12:30 PM Medical Record Number: [id number] Patient Account Number: [id number] Date of Birth/Sex: [birth year] (81 y.o. M) Treating RN: Carlene Coria Primary Care Provider: Elio Forget Other Clinician: Referring Provider: Elio Forget Treating Provider/Extender: Yaakov Guthrie in Treatment: 5 History of Present Illness HPI Description: Admission 09/08/2021 Randall Vincent is a 81 year old male with a past medical history of congestive heart failure, CKD stage III, type 2 diabetes currently controlled on oral medications and coronary artery disease status post CABG x2 that presents the clinic for a wound on his right arm for the past two weeks. He states he was walking his dog and Got tripped over the leash and fell creating an abrasion to his right arm. He states he visited urgent care and they helped stop the bleeding. He has not been using any wound care dressings but has been keeping the area covered with a Band-Aid. He currently denies signs of infection. 4/26; patient presents for follow-up. He has been using Xeroform dressings without issues. He reports improvement in wound healing. He has no issues or complaints today. 5/3; patient presents for follow-up. He has been using Xeroform without issues. Unfortunately he fell yesterday and developed a skin tear to his right upper extremity. He visited the ED for this issue. They  placed 50 sutures. He was started on Keflex. 5/10; patient presents for follow-up. He has Xeroform on the wound bed today. We discussed using antibiotic ointment however I am not sure if he has been doing this. He has no issues or complaints today. He denies signs of infection. 5/17; patient presents for follow-up. He has been using mupirocin to the wound bed. He is having trouble doing  dressing changes with Xeroform. He denies signs of infection. 5/24; patient presents for follow-up. He has been using mupirocin to the wound bed without issues. This is an easy dressing for him to do on his own. He is pleased with his wound healing so far. He denies signs of infection. Electronic Signature(s) Signed: 10/13/2021 2:19:48 PM By: Kalman Shan DO Entered By: Kalman Shan on 10/13/2021 13:27:01 Randall Vincent ([id number]) -------------------------------------------------------------------------------- Physical Exam Details Patient Name: Randall Vincent Date of Service: 10/13/2021 12:30 PM Medical Record Number: [id number] Patient Account Number: [id number] Date of Birth/Sex: 1940-06-28 (81 y.o. M) Treating RN: Carlene Coria Primary Care Provider: Elio Forget Other Clinician: Referring Provider: Elio Forget Treating Provider/Extender: Yaakov Guthrie in Treatment: 5 Constitutional . Cardiovascular . Psychiatric . Notes Right upper extremity: To the forearm there is an open wound with granulation tissue and nonviable tissue. No signs of surrounding infection. Electronic Signature(s) Signed: 10/13/2021 2:19:48 PM By: Kalman Shan DO Entered By: Kalman Shan on 10/13/2021 13:27:18 Randall Vincent ([id number]) -------------------------------------------------------------------------------- Physician Orders Details Patient Name: Randall Vincent Date of Service: 10/13/2021 12:30 PM Medical Record Number: [id number] Patient Account Number: [id number] Date of Birth/Sex: Nov 02, 1940  (81 y.o. M) Treating RN: Carlene Coria Primary Care Provider: Elio Forget Other Clinician: Referring Provider: Elio Forget Treating Provider/Extender: Yaakov Guthrie in Treatment: 5 Verbal / Phone Orders: No Diagnosis Coding Follow-up Appointments o Return Appointment in 1 week. Bathing/ Shower/ Hygiene o Clean wound with Normal Saline or wound cleanser. Anesthetic (Use 'Patient Medications' Section for Anesthetic Order Entry) o Lidocaine applied to wound bed Wound Treatment Wound #2 - Forearm Wound Laterality: Right Cleanser: Byram Ancillary Kit - 15 Day Supply (Generic) 1 x Per Day/30 Days Discharge Instructions: Use supplies as instructed; Kit contains: (15) Saline Bullets; (15) 3x3 Gauze; 15 pr Gloves Cleanser: Normal Saline 1 x Per Day/30 Days Discharge Instructions: Wash your hands with soap and water. Remove old dressing, discard into plastic bag and place into trash. Cleanse the wound with Normal Saline prior to applying a clean dressing using gauze sponges, not tissues or cotton balls. Do not scrub or use excessive force. Pat dry using gauze sponges, not tissue or cotton balls. Topical: Mupirocin Ointment 1 x Per Day/30 Days Discharge Instructions: Apply as directed by provider. Secondary Dressing: Bordered Gauze Sterile-HBD 4x4 (in/in) 1 x Per Day/30 Days Discharge Instructions: Cover wound with Bordered Guaze Sterile as directed Secured With: Stretch Net, Size 4, 10 (yds) 1 x Per Day/30 Days Patient Medications Allergies: No Known Allergies Notifications Medication Indication Start End mupirocin 10/13/2021 DOSE 1 - topical 2 % ointment - apply daily to the wound Electronic Signature(s) Signed: 10/13/2021 2:19:48 PM By: Kalman Shan DO Previous Signature: 10/13/2021 1:29:02 PM Version By: Kalman Shan DO Entered By: Kalman Shan on 10/13/2021 13:29:12 Randall Vincent  ([id number]) -------------------------------------------------------------------------------- Problem List Details Patient Name: Randall Vincent Date of Service: 10/13/2021 12:30 PM Medical Record Number: [id number] Patient Account Number: [id number] Date of Birth/Sex: 09/18/1940 (81 y.o. M) Treating RN: Carlene Coria Primary Care Provider: Elio Forget Other Clinician: Referring Provider: Elio Forget Treating Provider/Extender: Yaakov Guthrie in Treatment: 5 Active Problems ICD-10 Encounter Code Description Active Date MDM Diagnosis [phone] 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d 09/08/2021 No Yes S41.101A Unspecified open wound of right upper arm, initial encounter 09/08/2021 No Yes S40.811A Abrasion of right upper arm, initial encounter 09/08/2021 No Yes I25.10 Atherosclerotic heart disease of native coronary artery without angina 09/08/2021 No Yes pectoris E11.622 Type 2 diabetes mellitus  with other skin ulcer 09/08/2021 No Yes N18.31 Chronic kidney disease, stage 3a 09/08/2021 No Yes I50.9 Heart failure, unspecified 09/08/2021 No Yes Inactive Problems Resolved Problems Electronic Signature(s) Signed: 10/13/2021 2:19:48 PM By: Kalman Shan DO Entered By: Kalman Shan on 10/13/2021 13:07:57 Randall Vincent ([id number]) -------------------------------------------------------------------------------- Progress Note Details Patient Name: Randall Vincent Date of Service: 10/13/2021 12:30 PM Medical Record Number: [id number] Patient Account Number: [id number] Date of Birth/Sex: 1941-03-23 (81 y.o. M) Treating RN: Carlene Coria Primary Care Provider: Elio Forget Other Clinician: Referring Provider: Elio Forget Treating Provider/Extender: Yaakov Guthrie in Treatment: 5 Subjective Chief Complaint Information obtained from Patient 09/08/2021; wound to the right upper extremity due to trauma History of Present Illness (HPI) Admission  09/08/2021 Randall Vincent is a 81 year old male with a past medical history of congestive heart failure, CKD stage III, type 2 diabetes currently controlled on oral medications and coronary artery disease status post CABG x2 that presents the clinic for a wound on his right arm for the past two weeks. He states he was walking his dog and Got tripped over the leash and fell creating an abrasion to his right arm. He states he visited urgent care and they helped stop the bleeding. He has not been using any wound care dressings but has been keeping the area covered with a Band-Aid. He currently denies signs of infection. 4/26; patient presents for follow-up. He has been using Xeroform dressings without issues. He reports improvement in wound healing. He has no issues or complaints today. 5/3; patient presents for follow-up. He has been using Xeroform without issues. Unfortunately he fell yesterday and developed a skin tear to his right upper extremity. He visited the ED for this issue. They placed 50 sutures. He was started on Keflex. 5/10; patient presents for follow-up. He has Xeroform on the wound bed today. We discussed using antibiotic ointment however I am not sure if he has been doing this. He has no issues or complaints today. He denies signs of infection. 5/17; patient presents for follow-up. He has been using mupirocin to the wound bed. He is having trouble doing dressing changes with Xeroform. He denies signs of infection. 5/24; patient presents for follow-up. He has been using mupirocin to the wound bed without issues. This is an easy dressing for him to do on his own. He is pleased with his wound healing so far. He denies signs of infection. Objective Constitutional Vitals Time Taken: 12:36 PM, Height: 68 in, Weight: 210 lbs, BMI: 31.9, Temperature: 97.8 F, Pulse: 60 bpm, Respiratory Rate: 18 breaths/min, Blood Pressure: 130/60 mmHg. General Notes: Right upper extremity: To the  forearm there is an open wound with granulation tissue and nonviable tissue. No signs of surrounding infection. Integumentary (Hair, Skin) Wound #2 status is Open. Original cause of wound was Skin Tear/Laceration. The date acquired was: 09/21/2021. The wound has been in treatment 3 weeks. The wound is located on the Right Forearm. The wound measures 2.1cm length x 2.3cm width x 0.2cm depth; 3.793cm^2 area and 0.759cm^3 volume. There is Fat Layer (Subcutaneous Tissue) exposed. There is no tunneling or undermining noted. There is a medium amount of serosanguineous drainage noted. There is medium (34-66%) red granulation within the wound bed. There is a medium (34-66%) amount of necrotic tissue within the wound bed including Adherent Slough. Assessment Active Problems ICD-10 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d Randall Vincent, Randall Vincent ([id number]) Unspecified open wound of right upper arm, initial encounter Abrasion of right upper  arm, initial encounter Atherosclerotic heart disease of native coronary artery without angina pectoris Type 2 diabetes mellitus with other skin ulcer Chronic kidney disease, stage 3a Heart failure, unspecified Patient's wound has improved in size in appearance since last clinic visit. I debrided nonviable tissue. I recommended continuing mupirocin ointment daily. Follow-up in 1 week. Procedures Wound #2 Pre-procedure diagnosis of Wound #2 is a Skin Tear located on the Right Forearm . There was a Excisional Skin/Subcutaneous Tissue Debridement with a total area of 4.83 sq cm performed by Kalman Shan, MD. With the following instrument(s): Curette to remove Viable and Non-Viable tissue/material. Material removed includes Subcutaneous Tissue and Slough and. No specimens were taken. A time out was conducted at 13:00, prior to the start of the procedure. A Minimum amount of bleeding was controlled with Pressure. The procedure was tolerated well  with a pain level of 0 throughout and a pain level of 0 following the procedure. Post Debridement Measurements: 2.1cm length x 2.3cm width x 0.2cm depth; 0.759cm^3 volume. Character of Wound/Ulcer Post Debridement is improved. Post procedure Diagnosis Wound #2: Same as Pre-Procedure Plan Follow-up Appointments: Return Appointment in 1 week. Bathing/ Shower/ Hygiene: Clean wound with Normal Saline or wound cleanser. Anesthetic (Use 'Patient Medications' Section for Anesthetic Order Entry): Lidocaine applied to wound bed WOUND #2: - Forearm Wound Laterality: Right Cleanser: Byram Ancillary Kit - 15 Day Supply (Generic) 1 x Per Day/30 Days Discharge Instructions: Use supplies as instructed; Kit contains: (15) Saline Bullets; (15) 3x3 Gauze; 15 pr Gloves Cleanser: Normal Saline 1 x Per Day/30 Days Discharge Instructions: Wash your hands with soap and water. Remove old dressing, discard into plastic bag and place into trash. Cleanse the wound with Normal Saline prior to applying a clean dressing using gauze sponges, not tissues or cotton balls. Do not scrub or use excessive force. Pat dry using gauze sponges, not tissue or cotton balls. Topical: Mupirocin Ointment 1 x Per Day/30 Days Discharge Instructions: Apply as directed by provider. Secondary Dressing: Bordered Gauze Sterile-HBD 4x4 (in/in) 1 x Per Day/30 Days Discharge Instructions: Cover wound with Bordered Guaze Sterile as directed Secured With: Stretch Net, Size 4, 10 (yds) 1 x Per Day/30 Days 1. In office sharp debridement 2. Mupirocin ointment daily 3. Follow-up in 1 week Electronic Signature(s) Signed: 10/13/2021 2:19:48 PM By: Kalman Shan DO Entered By: Kalman Shan on 10/13/2021 13:27:55 Randall Vincent ([id number]) -------------------------------------------------------------------------------- [place] Details Patient Name: Randall Vincent Date of Service: 10/13/2021 Medical Record Number: [id number] Patient  Account Number: [id number] Date of Birth/Sex: November 21, 1940 (81 y.o. M) Treating RN: Carlene Coria Primary Care Provider: Elio Forget Other Clinician: Referring Provider: Elio Forget Treating Provider/Extender: Yaakov Guthrie in Treatment: 5 Diagnosis Coding ICD-10 Codes Code Description [phone] 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d S41.101A Unspecified open wound of right upper arm, initial encounter S40.811A Abrasion of right upper arm, initial encounter I25.10 Atherosclerotic heart disease of native coronary artery without angina pectoris E11.622 Type 2 diabetes mellitus with other skin ulcer N18.31 Chronic kidney disease, stage 3a I50.9 Heart failure, unspecified Facility Procedures CPT4 Code: [id number] Description: 11042 - DEB SUBQ TISSUE 20 SQ CM/< Modifier: Quantity: 1 CPT4 Code: Description: ICD-10 Diagnosis Description L98.492 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 S41.101A Unspecified open wound of right upper arm, initial encounter S40.811A Abrasion of right upper arm, initial encounter Modifier: d Quantity: Physician Procedures CPT4 Code: 4166063 Description: 11042 - WC PHYS SUBQ TISS 20 SQ CM Modifier: Quantity:  1 CPT4 Code: Description: ICD-10 Diagnosis Description L98.492 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

## 2021-10-20 ENCOUNTER — Encounter (HOSPITAL_BASED_OUTPATIENT_CLINIC_OR_DEPARTMENT_OTHER): Payer: Medicare Other | Admitting: Internal Medicine

## 2021-10-20 DIAGNOSIS — S41101A Unspecified open wound of right upper arm, initial encounter: Secondary | ICD-10-CM | POA: Diagnosis not present

## 2021-10-20 DIAGNOSIS — L98492 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d: Secondary | ICD-10-CM

## 2021-10-20 DIAGNOSIS — S40811A Abrasion of right upper arm, initial encounter: Secondary | ICD-10-CM | POA: Diagnosis not present

## 2021-10-20 DIAGNOSIS — E11622 Type 2 diabetes mellitus with other skin ulcer: Secondary | ICD-10-CM | POA: Diagnosis not present

## 2021-10-20 NOTE — Progress Notes (Signed)
Randall Vincent, Randall Vincent ([id number]) Visit Report for 10/20/2021 Chief Complaint Document Details Patient Name: Randall Vincent, Randall Vincent Date of Service: 10/20/2021 8:00 AM Medical Record Number: [id number] Patient Account Number: [id number] Date of Birth/Sex: 03-23-1941 (81 y.o. M) Treating RN: Randall Vincent Primary Care Provider: Elio Forget Other Clinician: Referring Provider: Elio Forget Treating Provider/Extender: Yaakov Guthrie in Treatment: 6 Information Obtained from: Patient Chief Complaint 09/08/2021; wound to the right upper extremity due to trauma Electronic Signature(s) Signed: 10/20/2021 11:38:35 AM By: Kalman Shan DO Entered By: Kalman Shan on 10/20/2021 09:22:50 Randall Vincent ([id number]) -------------------------------------------------------------------------------- Debridement Details Patient Name: Randall Vincent Date of Service: 10/20/2021 8:00 AM Medical Record Number: [id number] Patient Account Number: [id number] Date of Birth/Sex: [birth year] (81 y.o. M) Treating RN: Randall Vincent Primary Care Provider: Elio Forget Other Clinician: Referring Provider: Elio Forget Treating Provider/Extender: Yaakov Guthrie in Treatment: 6 Debridement Performed for Wound #2 Right Forearm Assessment: Performed By: Physician Kalman Shan, MD Debridement Type: Debridement Level of Consciousness (Pre- Awake and Alert procedure): Pre-procedure Verification/Time Out Yes - 08:30 Taken: Start Time: 08:30 Total Area Debrided (L x W): 0.7 (cm) x 0.6 (cm) = 0.42 (cm) Tissue and other material Viable, Non-Viable, Slough, Subcutaneous, Slough debrided: Level: Skin/Subcutaneous Tissue Debridement Description: Excisional Instrument: Curette Bleeding: Minimum Hemostasis Achieved: Pressure End Time: 08:32 Procedural Pain: 0 Post Procedural Pain: 0 Response to Treatment: Procedure was tolerated well Level of Consciousness (Post- Awake and Alert procedure): Post  Debridement Measurements of Total Wound Length: (cm) 0.7 Width: (cm) 0.6 Depth: (cm) 0.2 Volume: (cm) 0.066 Character of Wound/Ulcer Post Debridement: Improved Post Procedure Diagnosis Same as Pre-procedure Electronic Signature(s) Signed: 10/20/2021 11:38:35 AM By: Kalman Shan DO Signed: 10/20/2021 5:04:43 PM By: Randall Coria RN Entered By: Randall Vincent on 10/20/2021 [name], [name] ([id number]) -------------------------------------------------------------------------------- HPI Details Patient Name: Randall Vincent Date of Service: 10/20/2021 8:00 AM Medical Record Number: [id number] Patient Account Number: [id number] Date of Birth/Sex: 1940-12-05 (81 y.o. M) Treating RN: Randall Vincent Primary Care Provider: Elio Forget Other Clinician: Referring Provider: Elio Forget Treating Provider/Extender: Yaakov Guthrie in Treatment: 6 History of Present Illness HPI Description: Admission 09/08/2021 Randall Vincent is a 81 year old male with a past medical history of congestive heart failure, CKD stage III, type 2 diabetes currently controlled on oral medications and coronary artery disease status post CABG x2 that presents the clinic for a wound on his right arm for the past two weeks. He states he was walking his dog and Got tripped over the leash and fell creating an abrasion to his right arm. He states he visited urgent care and they helped stop the bleeding. He has not been using any wound care dressings but has been keeping the area covered with a Band-Aid. He currently denies signs of infection. 4/26; patient presents for follow-up. He has been using Xeroform dressings without issues. He reports improvement in wound healing. He has no issues or complaints today. 5/3; patient presents for follow-up. He has been using Xeroform without issues. Unfortunately he fell yesterday and developed a skin tear to his right upper extremity. He visited the ED for this issue. They  placed 50 sutures. He was started on Keflex. 5/10; patient presents for follow-up. He has Xeroform on the wound bed today. We discussed using antibiotic ointment however I am not sure if he has been doing this. He has no issues or complaints today. He denies signs of infection. 5/17; patient presents for follow-up. He has been using mupirocin to the wound bed. He is having trouble doing  dressing changes with Xeroform. He denies signs of infection. 5/24; patient presents for follow-up. He has been using mupirocin to the wound bed without issues. This is an easy dressing for him to do on his own. He is pleased with his wound healing so far. He denies signs of infection. 5/31; patient presents for follow-up. He continues to put mupirocin on the wound bed. He has no issues or concerns today. He currently denies signs of infection. Electronic Signature(s) Signed: 10/20/2021 11:38:35 AM By: Kalman Shan DO Entered By: Kalman Shan on 10/20/2021 09:23:45 Randall Vincent ([id number]) -------------------------------------------------------------------------------- Physical Exam Details Patient Name: Randall Vincent Date of Service: 10/20/2021 8:00 AM Medical Record Number: [id number] Patient Account Number: [id number] Date of Birth/Sex: June 04, 1940 (81 y.o. M) Treating RN: Randall Vincent Primary Care Provider: Elio Forget Other Clinician: Referring Provider: Elio Forget Treating Provider/Extender: Yaakov Guthrie in Treatment: 6 Constitutional . Psychiatric . Notes Right upper extremity: To the forearm there is an open wound with dried fluid and nonviable tissue. Postdebridement there is granulation tissue throughout. No surrounding signs of infection. Electronic Signature(s) Signed: 10/20/2021 11:38:35 AM By: Kalman Shan DO Entered By: Kalman Shan on 10/20/2021 09:24:15 Randall Vincent  ([id number]) -------------------------------------------------------------------------------- Physician Orders Details Patient Name: Randall Vincent Date of Service: 10/20/2021 8:00 AM Medical Record Number: [id number] Patient Account Number: [id number] Date of Birth/Sex: 1940/12/24 (81 y.o. M) Treating RN: Randall Vincent Primary Care Provider: Elio Forget Other Clinician: Referring Provider: Elio Forget Treating Provider/Extender: Yaakov Guthrie in Treatment: 6 Verbal / Phone Orders: No Diagnosis Coding Follow-up Appointments o Return Appointment in 1 week. Bathing/ Shower/ Hygiene o Clean wound with Normal Saline or wound cleanser. Anesthetic (Use 'Patient Medications' Section for Anesthetic Order Entry) o Lidocaine applied to wound bed Wound Treatment Wound #2 - Forearm Wound Laterality: Right Cleanser: Byram Ancillary Kit - 15 Day Supply (Generic) 1 x Per Day/30 Days Discharge Instructions: Use supplies as instructed; Kit contains: (15) Saline Bullets; (15) 3x3 Gauze; 15 pr Gloves Cleanser: Normal Saline 1 x Per Day/30 Days Discharge Instructions: Wash your hands with soap and water. Remove old dressing, discard into plastic bag and place into trash. Cleanse the wound with Normal Saline prior to applying a clean dressing using gauze sponges, not tissues or cotton balls. Do not scrub or use excessive force. Pat dry using gauze sponges, not tissue or cotton balls. Topical: Mupirocin Ointment 1 x Per Day/30 Days Discharge Instructions: Apply as directed by provider. Secondary Dressing: Bordered Gauze Sterile-HBD 4x4 (in/in) 1 x Per Day/30 Days Discharge Instructions: Cover wound with Bordered Guaze Sterile as directed Secured With: Stretch Net, Size 4, 10 (yds) 1 x Per Day/30 Days Electronic Signature(s) Signed: 10/20/2021 11:38:35 AM By: Kalman Shan DO Entered By: Kalman Shan on 10/20/2021 09:25:55 Randall Vincent  ([id number]) -------------------------------------------------------------------------------- Problem List Details Patient Name: Randall Vincent Date of Service: 10/20/2021 8:00 AM Medical Record Number: [id number] Patient Account Number: [id number] Date of Birth/Sex: 1941/05/03 (81 y.o. M) Treating RN: Randall Vincent Primary Care Provider: Elio Forget Other Clinician: Referring Provider: Elio Forget Treating Provider/Extender: Yaakov Guthrie in Treatment: 6 Active Problems ICD-10 Encounter Code Description Active Date MDM Diagnosis [phone] 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d 09/08/2021 No Yes S41.101A Unspecified open wound of right upper arm, initial encounter 09/08/2021 No Yes S40.811A Abrasion of right upper arm, initial encounter 09/08/2021 No Yes I25.10 Atherosclerotic heart disease of native coronary artery without angina 09/08/2021 No Yes pectoris E11.622 Type 2 diabetes mellitus with other skin ulcer 09/08/2021  No Yes N18.31 Chronic kidney disease, stage 3a 09/08/2021 No Yes I50.9 Heart failure, unspecified 09/08/2021 No Yes Inactive Problems Resolved Problems Electronic Signature(s) Signed: 10/20/2021 11:38:35 AM By: Kalman Shan DO Entered By: Kalman Shan on 10/20/2021 09:22:44 Randall Vincent ([id number]) -------------------------------------------------------------------------------- Progress Note Details Patient Name: Randall Vincent Date of Service: 10/20/2021 8:00 AM Medical Record Number: [id number] Patient Account Number: [id number] Date of Birth/Sex: 02-Mar-1941 (80 y.o. M) Treating RN: Randall Vincent Primary Care Provider: Elio Forget Other Clinician: Referring Provider: Elio Forget Treating Provider/Extender: Yaakov Guthrie in Treatment: 6 Subjective Chief Complaint Information obtained from Patient 09/08/2021; wound to the right upper extremity due to trauma History of Present Illness (HPI) Admission  09/08/2021 Randall Vincent is a 81 year old male with a past medical history of congestive heart failure, CKD stage III, type 2 diabetes currently controlled on oral medications and coronary artery disease status post CABG x2 that presents the clinic for a wound on his right arm for the past two weeks. He states he was walking his dog and Got tripped over the leash and fell creating an abrasion to his right arm. He states he visited urgent care and they helped stop the bleeding. He has not been using any wound care dressings but has been keeping the area covered with a Band-Aid. He currently denies signs of infection. 4/26; patient presents for follow-up. He has been using Xeroform dressings without issues. He reports improvement in wound healing. He has no issues or complaints today. 5/3; patient presents for follow-up. He has been using Xeroform without issues. Unfortunately he fell yesterday and developed a skin tear to his right upper extremity. He visited the ED for this issue. They placed 50 sutures. He was started on Keflex. 5/10; patient presents for follow-up. He has Xeroform on the wound bed today. We discussed using antibiotic ointment however I am not sure if he has been doing this. He has no issues or complaints today. He denies signs of infection. 5/17; patient presents for follow-up. He has been using mupirocin to the wound bed. He is having trouble doing dressing changes with Xeroform. He denies signs of infection. 5/24; patient presents for follow-up. He has been using mupirocin to the wound bed without issues. This is an easy dressing for him to do on his own. He is pleased with his wound healing so far. He denies signs of infection. 5/31; patient presents for follow-up. He continues to put mupirocin on the wound bed. He has no issues or concerns today. He currently denies signs of infection. Objective Constitutional Vitals Time Taken: 8:07 AM, Height: 68 in, Weight: 210 lbs,  BMI: 31.9, Temperature: 98 F, Pulse: 63 bpm, Respiratory Rate: 18 breaths/min, Blood Pressure: 134/72 mmHg. General Notes: Right upper extremity: To the forearm there is an open wound with dried fluid and nonviable tissue. Postdebridement there is granulation tissue throughout. No surrounding signs of infection. Integumentary (Hair, Skin) Wound #2 status is Open. Original cause of wound was Skin Tear/Laceration. The date acquired was: 09/21/2021. The wound has been in treatment 4 weeks. The wound is located on the Right Forearm. The wound measures 0.7cm length x 0.6cm width x 0.2cm depth; 0.33cm^2 area and 0.066cm^3 volume. There is Fat Layer (Subcutaneous Tissue) exposed. There is no tunneling or undermining noted. There is a medium amount of serosanguineous drainage noted. There is medium (34-66%) red granulation within the wound bed. There is a medium (34-66%) amount of necrotic tissue within the wound bed including Adherent Slough. Assessment Randall Vincent, Randall Vincent ([id number])  Active Problems ICD-10 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d Unspecified open wound of right upper arm, initial encounter Abrasion of right upper arm, initial encounter Atherosclerotic heart disease of native coronary artery without angina pectoris Type 2 diabetes mellitus with other skin ulcer Chronic kidney disease, stage 3a Heart failure, unspecified Patient's wound has shown improvement in size and appearance since last clinic visit. I debrided nonviable tissue. I recommended continuing mupirocin ointment daily as this is an easy dressing for the patient to do on his own. And he is showing significant signs of improvement. Follow- up in 1 week. Procedures Wound #2 Pre-procedure diagnosis of Wound #2 is a Skin Tear located on the Right Forearm . There was a Excisional Skin/Subcutaneous Tissue Debridement with a total area of 0.42 sq cm performed by Kalman Shan, MD. With the  following instrument(s): Curette to remove Viable and Non-Viable tissue/material. Material removed includes Subcutaneous Tissue and Slough and. No specimens were taken. A time out was conducted at 08:30, prior to the start of the procedure. A Minimum amount of bleeding was controlled with Pressure. The procedure was tolerated well with a pain level of 0 throughout and a pain level of 0 following the procedure. Post Debridement Measurements: 0.7cm length x 0.6cm width x 0.2cm depth; 0.066cm^3 volume. Character of Wound/Ulcer Post Debridement is improved. Post procedure Diagnosis Wound #2: Same as Pre-Procedure Plan Follow-up Appointments: Return Appointment in 1 week. Bathing/ Shower/ Hygiene: Clean wound with Normal Saline or wound cleanser. Anesthetic (Use 'Patient Medications' Section for Anesthetic Order Entry): Lidocaine applied to wound bed WOUND #2: - Forearm Wound Laterality: Right Cleanser: Byram Ancillary Kit - 15 Day Supply (Generic) 1 x Per Day/30 Days Discharge Instructions: Use supplies as instructed; Kit contains: (15) Saline Bullets; (15) 3x3 Gauze; 15 pr Gloves Cleanser: Normal Saline 1 x Per Day/30 Days Discharge Instructions: Wash your hands with soap and water. Remove old dressing, discard into plastic bag and place into trash. Cleanse the wound with Normal Saline prior to applying a clean dressing using gauze sponges, not tissues or cotton balls. Do not scrub or use excessive force. Pat dry using gauze sponges, not tissue or cotton balls. Topical: Mupirocin Ointment 1 x Per Day/30 Days Discharge Instructions: Apply as directed by provider. Secondary Dressing: Bordered Gauze Sterile-HBD 4x4 (in/in) 1 x Per Day/30 Days Discharge Instructions: Cover wound with Bordered Guaze Sterile as directed Secured With: Stretch Net, Size 4, 10 (yds) 1 x Per Day/30 Days 1. In office sharp debridement 2. Mupirocin ointment 3. Follow-up in 1 week Electronic Signature(s) Signed:  10/20/2021 11:38:35 AM By: Kalman Shan DO Entered By: Kalman Shan on 10/20/2021 09:25:33 Randall Vincent ([id number]) -------------------------------------------------------------------------------- [place] Details Patient Name: Randall Vincent Date of Service: 10/20/2021 Medical Record Number: [id number] Patient Account Number: [id number] Date of Birth/Sex: 11-30-1940 (80 y.o. M) Treating RN: Randall Vincent Primary Care Provider: Elio Forget Other Clinician: Referring Provider: Elio Forget Treating Provider/Extender: Yaakov Guthrie in Treatment: 6 Diagnosis Coding ICD-10 Codes Code Description [phone] 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d S41.101A Unspecified open wound of right upper arm, initial encounter S40.811A Abrasion of right upper arm, initial encounter I25.10 Atherosclerotic heart disease of native coronary artery without angina pectoris E11.622 Type 2 diabetes mellitus with other skin ulcer N18.31 Chronic kidney disease, stage 3a I50.9 Heart failure, unspecified Facility Procedures CPT4 Code: 46962952 Description: 84132 - DEB SUBQ TISSUE 20 SQ CM/< Modifier: Quantity: 1 CPT4 Code: Description: ICD-10 Diagnosis Description  S06.301 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 S41.101A Unspecified open wound of right upper arm, initial encounter S40.811A Abrasion of right upper arm, initial encounter Modifier: d Quantity: Physician Procedures CPT4 Code: 6010932 Description: 35573 - WC PHYS SUBQ TISS 20 SQ CM Modifier: Quantity: 1 CPT4 Code: Description: ICD-10 Diagnosis Description L98.492 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

## 2021-10-20 NOTE — Progress Notes (Signed)
DHILAN, BRAUER ([id number]) Visit Report for 10/20/2021 Arrival Information Details Patient Name: Randall Vincent, Randall Vincent Date of Service: 10/20/2021 8:00 AM Medical Record Number: [id number] Patient Account Number: [id number] Date of Birth/Sex: 1940-12-31 (80 y.o. M) Treating RN: Carlene Coria Primary Care Tae Vonada: Elio Forget Other Clinician: Referring Daniella Dewberry: Elio Forget Treating Ajooni Karam/Extender: Yaakov Guthrie in Treatment: 6 Visit Information History Since Last Visit All ordered tests and consults were completed: No Patient Arrived: Gilford Rile Added or deleted any medications: No Arrival Time: 08:03 Any new allergies or adverse reactions: No Accompanied By: self Had a fall or experienced change in No Transfer Assistance: None activities of daily living that may affect Patient Identification Verified: Yes risk of falls: Secondary Verification Process Completed: Yes Signs or symptoms of abuse/neglect since last visito No Patient Requires Transmission-Based No Hospitalized since last visit: No Precautions: Implantable device outside of the clinic excluding No Patient Has Alerts: Yes cellular tissue based products placed in the center Patient Alerts: Patient on Blood since last visit: Thinner Pain Present Now: No Eliquis Diabetic Electronic Signature(s) Signed: 10/20/2021 5:04:43 PM By: Carlene Coria RN Entered By: Carlene Coria on 10/20/2021 08:07:37 Randall Vincent ([id number]) -------------------------------------------------------------------------------- Clinic Level of Care Assessment Details Patient Name: Randall Vincent Date of Service: 10/20/2021 8:00 AM Medical Record Number: [id number] Patient Account Number: [id number] Date of Birth/Sex: [birth year] (80 y.o. M) Treating RN: Carlene Coria Primary Care Haydon Dorris: Elio Forget Other Clinician: Referring Kandon Hosking: Elio Forget Treating Daulton Harbaugh/Extender: Yaakov Guthrie in Treatment: 6 Clinic Level of Care  Assessment Items TOOL 1 Quantity Score '[]'  - Use when EandM and Procedure is performed on INITIAL visit 0 ASSESSMENTS - Nursing Assessment / Reassessment '[]'  - General Physical Exam (combine w/ comprehensive assessment (listed just below) when performed on new 0 pt. evals) '[]'  - 0 Comprehensive Assessment (HX, ROS, Risk Assessments, Wounds Hx, etc.) ASSESSMENTS - Wound and Skin Assessment / Reassessment '[]'  - Dermatologic / Skin Assessment (not related to wound area) 0 ASSESSMENTS - Ostomy and/or Continence Assessment and Care '[]'  - Incontinence Assessment and Management 0 '[]'  - 0 Ostomy Care Assessment and Management (repouching, etc.) PROCESS - Coordination of Care '[]'  - Simple Patient / Family Education for ongoing care 0 '[]'  - 0 Complex (extensive) Patient / Family Education for ongoing care '[]'  - 0 Staff obtains Programmer, systems, Records, Test Results / Process Orders '[]'  - 0 Staff telephones HHA, Nursing Homes / Clarify orders / etc '[]'  - 0 Routine Transfer to another Facility (non-emergent condition) '[]'  - 0 Routine Hospital Admission (non-emergent condition) '[]'  - 0 New Admissions / Biomedical engineer / Ordering NPWT, Apligraf, etc. '[]'  - 0 Emergency Hospital Admission (emergent condition) PROCESS - Special Needs '[]'  - Pediatric / Minor Patient Management 0 '[]'  - 0 Isolation Patient Management '[]'  - 0 Hearing / Language / Visual special needs '[]'  - 0 Assessment of Community assistance (transportation, D/C planning, etc.) '[]'  - 0 Additional assistance / Altered mentation '[]'  - 0 Support Surface(s) Assessment (bed, cushion, seat, etc.) INTERVENTIONS - Miscellaneous '[]'  - External ear exam 0 '[]'  - 0 Patient Transfer (multiple staff / Civil Service fast streamer / Similar devices) '[]'  - 0 Simple Staple / Suture removal (25 or less) '[]'  - 0 Complex Staple / Suture removal (26 or more) '[]'  - 0 Hypo/Hyperglycemic Management (do not check if billed separately) '[]'  - 0 Ankle / Brachial Index (ABI) - do not  check if billed separately Has the patient been seen at the hospital within the last three years: Yes Total Score: 0 Level Of Care: ____  SABRI, TEAL ([id number]) Electronic Signature(s) Signed: 10/20/2021 5:04:43 PM By: Carlene Coria RN Entered By: Carlene Coria on 10/20/2021 08:32:07 JAHMIL, MACLEOD ([id number]) -------------------------------------------------------------------------------- Encounter Discharge Information Details Patient Name: Randall Vincent Date of Service: 10/20/2021 8:00 AM Medical Record Number: [id number] Patient Account Number: [id number] Date of Birth/Sex: 12/26/1940 (80 y.o. M) Treating RN: Carlene Coria Primary Care Shilo Pauwels: Elio Forget Other Clinician: Referring Camron Monday: Elio Forget Treating Ninah Moccio/Extender: Yaakov Guthrie in Treatment: 6 Encounter Discharge Information Items Post Procedure Vitals Discharge Condition: Stable Temperature (F): 98 Ambulatory Status: Walker Pulse (bpm): 63 Discharge Destination: Home Respiratory Rate (breaths/min): 18 Transportation: Private Auto Blood Pressure (mmHg): 134/72 Accompanied By: self Schedule Follow-up Appointment: Yes Clinical Summary of Care: Electronic Signature(s) Signed: 10/20/2021 5:04:43 PM By: Carlene Coria RN Entered By: Carlene Coria on 10/20/2021 08:33:13 Randall Vincent ([id number]) -------------------------------------------------------------------------------- Lower Extremity Assessment Details Patient Name: Randall Vincent Date of Service: 10/20/2021 8:00 AM Medical Record Number: [id number] Patient Account Number: [id number] Date of Birth/Sex: Sep 22, 1940 (80 y.o. M) Treating RN: Carlene Coria Primary Care Princeton Nabor: Elio Forget Other Clinician: Referring Wolfe Camarena: Elio Forget Treating Alexandr Oehler/Extender: Yaakov Guthrie in Treatment: 6 Electronic Signature(s) Signed: 10/20/2021 5:04:43 PM By: Carlene Coria RN Entered By: Carlene Coria on 10/20/2021 08:11:10 Randall Vincent  ([id number]) -------------------------------------------------------------------------------- Multi Wound Chart Details Patient Name: Randall Vincent Date of Service: 10/20/2021 8:00 AM Medical Record Number: [id number] Patient Account Number: [id number] Date of Birth/Sex: Nov 04, 1940 (80 y.o. M) Treating RN: Carlene Coria Primary Care Halie Gass: Elio Forget Other Clinician: Referring Jiovani Mccammon: Elio Forget Treating Sunaina Ferrando/Extender: Yaakov Guthrie in Treatment: 6 Vital Signs Height(in): 68 Pulse(bpm): 63 Weight(lbs): 210 Blood Pressure(mmHg): 134/72 Body Mass Index(BMI): 31.9 Temperature(F): 98 Respiratory Rate(breaths/min): 18 Photos: [N/A:N/A] Wound Location: Right Forearm N/A N/A Wounding Event: Skin Tear/Laceration N/A N/A Primary Etiology: Skin Tear N/A N/A Comorbid History: Arrhythmia, Congestive Heart N/A N/A Failure, Coronary Artery Disease, Hypertension, Type II Diabetes Date Acquired: 09/21/2021 N/A N/A Weeks of Treatment: 4 N/A N/A Wound Status: Open N/A N/A Wound Recurrence: No N/A N/A Clustered Wound: Yes N/A N/A Measurements L x W x D (cm) 0.7x0.6x0.2 N/A N/A Area (cm) : 0.33 N/A N/A Volume (cm) : 0.066 N/A N/A % Reduction in Area: 98.60% N/A N/A % Reduction in Volume: 97.20% N/A N/A Classification: Full Thickness Without Exposed N/A N/A Support Structures Exudate Amount: Medium N/A N/A Exudate Type: Serosanguineous N/A N/A Exudate Color: red, brown N/A N/A Granulation Amount: Medium (34-66%) N/A N/A Granulation Quality: Red N/A N/A Necrotic Amount: Medium (34-66%) N/A N/A Exposed Structures: Fat Layer (Subcutaneous Tissue): N/A N/A Yes Fascia: No Tendon: No Muscle: No Joint: No Bone: No Epithelialization: Small (1-33%) N/A N/A Treatment Notes Electronic Signature(s) Signed: 10/20/2021 5:04:43 PM By: Carlene Coria RN Entered By: Carlene Coria on 10/20/2021 08:11:20 Randall Vincent ([id number]) AMOUS, CREWE  ([id number]) -------------------------------------------------------------------------------- Multi-Disciplinary Care Plan Details Patient Name: Randall Vincent Date of Service: 10/20/2021 8:00 AM Medical Record Number: [id number] Patient Account Number: [id number] Date of Birth/Sex: January 28, 1941 (80 y.o. M) Treating RN: Carlene Coria Primary Care Kinaya Hilliker: Elio Forget Other Clinician: Referring Myrakle Wingler: Elio Forget Treating Jahmel Flannagan/Extender: Yaakov Guthrie in Treatment: 6 Active Inactive Abuse / Safety / Falls / Self Care Management Nursing Diagnoses: History of Falls Impaired physical mobility Potential for falls Potential for injury related to falls Goals: Patient will not experience any injury related to falls Date Initiated: 09/22/2021 Target Resolution Date: 10/29/2021 Goal Status: Active Patient/caregiver will verbalize understanding of skin care regimen Date Initiated: 09/22/2021 Target Resolution Date: 10/29/2021 Goal Status: Active Interventions: Assess impairment of mobility  on admission and as needed per policy Provide education on basic hygiene Provide education on fall prevention Treatment Activities: Education provided on Basic Hygiene : 09/22/2021 Notes: Electronic Signature(s) Signed: 10/20/2021 5:04:43 PM By: Carlene Coria RN Entered By: Carlene Coria on 10/20/2021 08:11:15 Randall Vincent ([id number]) -------------------------------------------------------------------------------- Pain Assessment Details Patient Name: Randall Vincent Date of Service: 10/20/2021 8:00 AM Medical Record Number: [id number] Patient Account Number: [id number] Date of Birth/Sex: [birth year] (80 y.o. M) Treating RN: Carlene Coria Primary Care Keyaira Clapham: Elio Forget Other Clinician: Referring Cambrie Sonnenfeld: Elio Forget Treating Bennetta Rudden/Extender: Yaakov Guthrie in Treatment: 6 Active Problems Location of Pain Severity and Description of Pain Patient Has Paino No Site  Locations Pain Management and Medication Current Pain Management: Electronic Signature(s) Signed: 10/20/2021 5:04:43 PM By: Carlene Coria RN Entered By: Carlene Coria on 10/20/2021 08:08:19 Randall Vincent ([id number]) -------------------------------------------------------------------------------- Patient/Caregiver Education Details Patient Name: Randall Vincent Date of Service: 10/20/2021 8:00 AM Medical Record Number: [id number] Patient Account Number: [id number] Date of Birth/Gender: 1941/03/21 (80 y.o. M) Treating RN: Carlene Coria Primary Care Physician: Elio Forget Other Clinician: Referring Physician: Elio Forget Treating Physician/Extender: Yaakov Guthrie in Treatment: 6 Education Assessment Education Provided To: Patient Education Topics Provided Basic Hygiene: Methods: Explain/Verbal Responses: State content correctly Safety: Methods: Explain/Verbal Responses: State content correctly Electronic Signature(s) Signed: 10/20/2021 5:04:43 PM By: Carlene Coria RN Entered By: Carlene Coria on 10/20/2021 08:32:27 Randall Vincent ([id number]) -------------------------------------------------------------------------------- Wound Assessment Details Patient Name: Randall Vincent Date of Service: 10/20/2021 8:00 AM Medical Record Number: [id number] Patient Account Number: [id number] Date of Birth/Sex: 20-Jun-1940 (80 y.o. M) Treating RN: Carlene Coria Primary Care Jesus Poplin: Elio Forget Other Clinician: Referring Joliene Salvador: Elio Forget Treating Levada Bowersox/Extender: Yaakov Guthrie in Treatment: 6 Wound Status Wound Number: 2 Primary Skin Tear Etiology: Wound Location: Right Forearm Wound Open Wounding Event: Skin Tear/Laceration Status: Date Acquired: 09/21/2021 Comorbid Arrhythmia, Congestive Heart Failure, Coronary Artery Weeks Of Treatment: 4 History: Disease, Hypertension, Type II Diabetes Clustered Wound: Yes Photos Wound Measurements Length: (cm) 0.7 Width:  (cm) 0.6 Depth: (cm) 0.2 Area: (cm) 0.33 Volume: (cm) 0.066 % Reduction in Area: 98.6% % Reduction in Volume: 97.2% Epithelialization: Small (1-33%) Tunneling: No Undermining: No Wound Description Classification: Full Thickness Without Exposed Support Structures Exudate Amount: Medium Exudate Type: Serosanguineous Exudate Color: red, brown Foul Odor After Cleansing: No Slough/Fibrino Yes Wound Bed Granulation Amount: Medium (34-66%) Exposed Structure Granulation Quality: Red Fascia Exposed: No Necrotic Amount: Medium (34-66%) Fat Layer (Subcutaneous Tissue) Exposed: Yes Necrotic Quality: Adherent Slough Tendon Exposed: No Muscle Exposed: No Joint Exposed: No Bone Exposed: No Treatment Notes Wound #2 (Forearm) Wound Laterality: Right Cleanser [place] Ancillary Kit - 15 Day Supply Discharge Instruction: Use supplies as instructed; Kit contains: (15) Saline Bullets; (15) 3x3 Gauze; 15 pr Gloves Normal Saline Discharge Instruction: Wash your hands with soap and water. Remove old dressing, discard into plastic bag and place into trash. Cleanse the wound with Normal Saline prior to applying a clean dressing using gauze sponges, not tissues or cotton balls. Do not TONIO, SEIDER ([id number]) scrub or use excessive force. Pat dry using gauze sponges, not tissue or cotton balls. Peri-Wound Care Topical Mupirocin Ointment Discharge Instruction: Apply as directed by Murlene Revell. Primary Dressing Secondary Dressing Bordered Gauze Sterile-HBD 4x4 (in/in) Discharge Instruction: Cover wound with Bordered Guaze Sterile as directed Secured With Stretch Net, Size 4, 10 (yds) Compression Wrap Compression Stockings Add-Ons Electronic Signature(s) Signed: 10/20/2021 5:04:43 PM By: Carlene Coria RN Entered By: Carlene Coria on 10/20/2021 08:11:01 Randall Vincent ([id number]) -------------------------------------------------------------------------------- Zena Details Patient Name:  Randall Vincent Date of Service: 10/20/2021 8:00 AM Medical Record Number: [id number] Patient Account Number: [id number] Date of Birth/Sex: 08-16-1940 (80 y.o. M) Treating RN: Carlene Coria Primary Care Dietrick Barris: Elio Forget Other Clinician: Referring Ghadeer Kastelic: Elio Forget Treating Annis Lagoy/Extender: Yaakov Guthrie in Treatment: 6 Vital Signs Time Taken: 08:07 Temperature (F): 98 Height (in): 68 Pulse (bpm): 63 Weight (lbs): 210 Respiratory Rate (breaths/min): 18 Body Mass Index (BMI): 31.9 Blood Pressure (mmHg): 134/72 Reference Range: 80 - 120 mg / dl Electronic Signature(s) Signed: 10/20/2021 5:04:43 PM By: Carlene Coria RN Entered By: Carlene Coria on 10/20/2021 08:08:09

## 2021-10-27 ENCOUNTER — Encounter: Payer: Medicare Other | Attending: Internal Medicine | Admitting: Physician Assistant

## 2021-10-27 DIAGNOSIS — I130 Hypertensive heart and chronic kidney disease with heart failure and stage 1 through stage 4 chronic kidney disease, or unspecified chronic kidney disease: Secondary | ICD-10-CM | POA: Diagnosis not present

## 2021-10-27 DIAGNOSIS — S41101A Unspecified open wound of right upper arm, initial encounter: Secondary | ICD-10-CM | POA: Diagnosis not present

## 2021-10-27 DIAGNOSIS — E11622 Type 2 diabetes mellitus with other skin ulcer: Secondary | ICD-10-CM | POA: Diagnosis present

## 2021-10-27 DIAGNOSIS — L98492 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d: Secondary | ICD-10-CM | POA: Diagnosis not present

## 2021-10-27 DIAGNOSIS — E1122 Type 2 diabetes mellitus with diabetic chronic kidney disease: Secondary | ICD-10-CM | POA: Insufficient documentation

## 2021-10-27 DIAGNOSIS — I2510 Atherosclerotic heart disease of native coronary artery without angina pectoris: Secondary | ICD-10-CM | POA: Diagnosis not present

## 2021-10-27 DIAGNOSIS — I509 Heart failure, unspecified: Secondary | ICD-10-CM | POA: Insufficient documentation

## 2021-10-27 DIAGNOSIS — N1831 Chronic kidney disease, stage 3a: Secondary | ICD-10-CM | POA: Diagnosis not present

## 2021-10-27 DIAGNOSIS — Z951 Presence of aortocoronary bypass graft: Secondary | ICD-10-CM | POA: Diagnosis not present

## 2021-10-27 NOTE — Progress Notes (Addendum)
KEOLA, HENINGER ([id number]) Visit Report for 10/27/2021 Chief Complaint Document Details Patient Name: Randall Vincent, Randall Vincent Date of Service: 10/27/2021 11:00 AM Medical Record Number: [id number] Patient Account Number: [id number] Date of Birth/Sex: Oct 17, 1940 (80 y.o. M) Treating RN: Carlene Coria Primary Care Provider: Elio Forget Other Clinician: Referring Provider: Elio Forget Treating Provider/Extender: Skipper Cliche in Treatment: 7 Information Obtained from: Patient Chief Complaint 09/08/2021; wound to the right upper extremity due to trauma Electronic Signature(s) Signed: 10/27/2021 11:31:29 AM By: Worthy Keeler PA-C Entered By: Worthy Randall Vincent on 10/27/2021 11:31:28 Randall Vincent ([id number]) -------------------------------------------------------------------------------- HPI Details Patient Name: Randall Vincent Date of Service: 10/27/2021 11:00 AM Medical Record Number: [id number] Patient Account Number: [id number] Date of Birth/Sex: [birth year] (81 y.o. M) Treating RN: Carlene Coria Primary Care Provider: Elio Forget Other Clinician: Referring Provider: Elio Forget Treating Provider/Extender: Skipper Cliche in Treatment: 7 History of Present Illness HPI Description: Admission 09/08/2021 Mr. Melburn Vincent is an 81 year old male with a past medical history of congestive heart failure, CKD stage III, type 2 diabetes currently controlled on oral medications and coronary artery disease status post CABG x2 that presents the clinic for a wound on his right arm for the past two weeks. He states he was walking his dog and Got tripped over the leash and fell creating an abrasion to his right arm. He states he visited urgent care and they helped stop the bleeding. He has not been using any wound care dressings but has been keeping the area covered with a Band-Aid. He currently denies signs of infection. 4/26; patient presents for follow-up. He has been using Xeroform dressings without  issues. He reports improvement in wound healing. He has no issues or complaints today. 5/3; patient presents for follow-up. He has been using Xeroform without issues. Unfortunately he fell yesterday and developed a skin tear to his right upper extremity. He visited the ED for this issue. They placed 50 sutures. He was started on Keflex. 5/10; patient presents for follow-up. He has Xeroform on the wound bed today. We discussed using antibiotic ointment however I am not sure if he has been doing this. He has no issues or complaints today. He denies signs of infection. 5/17; patient presents for follow-up. He has been using mupirocin to the wound bed. He is having trouble doing dressing changes with Xeroform. He denies signs of infection. 5/24; patient presents for follow-up. He has been using mupirocin to the wound bed without issues. This is an easy dressing for him to do on his own. He is pleased with his wound healing so far. He denies signs of infection. 5/31; patient presents for follow-up. He continues to put mupirocin on the wound bed. He has no issues or concerns today. He currently denies signs of infection. 10-27-2021 upon evaluation today patient's wound is almost completely healed. He has been using mupirocin and a Band-Aid and overall honestly I think he is doing quite well. He is very close to complete resolution which is great news. Electronic Signature(s) Signed: 10/27/2021 12:51:03 PM By: Worthy Keeler PA-C Entered By: Worthy Randall Vincent on 10/27/2021 12:51:03 Randall Vincent ([id number]) -------------------------------------------------------------------------------- Physical Exam Details Patient Name: Randall Vincent Date of Service: 10/27/2021 11:00 AM Medical Record Number: [id number] Patient Account Number: [id number] Date of Birth/Sex: [birth year] (81 y.o. M) Treating RN: Carlene Coria Primary Care Provider: Elio Forget Other Clinician: Referring Provider: Elio Forget Treating  Provider/Extender: Skipper Cliche in Treatment: 7 Constitutional Well-nourished and well-hydrated in no acute distress. Respiratory normal breathing without difficulty.  Psychiatric this patient is able to make decisions and demonstrates good insight into disease process. Alert and Oriented x 3. pleasant and cooperative. Notes Upon inspection patient's wound bed actually showed signs of good granulation and epithelization at this point. Fortunately I do not see any evidence of infection locally or systemically which is great news and overall I am extremely pleased with where we stand today. Electronic Signature(s) Signed: 10/27/2021 12:51:19 PM By: Worthy Keeler PA-C Entered By: Worthy Randall Vincent on 10/27/2021 12:51:19 Randall Vincent ([id number]) -------------------------------------------------------------------------------- Physician Orders Details Patient Name: Randall Vincent Date of Service: 10/27/2021 11:00 AM Medical Record Number: [id number] Patient Account Number: [id number] Date of Birth/Sex: Aug 19, 1940 (81 y.o. M) Treating RN: Carlene Coria Primary Care Provider: Elio Forget Other Clinician: Referring Provider: Elio Forget Treating Provider/Extender: Skipper Cliche in Treatment: 7 Verbal / Phone Orders: No Diagnosis Coding ICD-10 Coding Code Description [phone] 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d S41.101A Unspecified open wound of right upper arm, initial encounter S40.811A Abrasion of right upper arm, initial encounter I25.10 Atherosclerotic heart disease of native coronary artery without angina pectoris E11.622 Type 2 diabetes mellitus with other skin ulcer N18.31 Chronic kidney disease, stage 3a I50.9 Heart failure, unspecified Follow-up Appointments o Return Appointment in 1 week. Bathing/ Shower/ Hygiene o Clean wound with Normal Saline or wound cleanser. Anesthetic (Use 'Patient Medications' Section for Anesthetic  Order Entry) o Lidocaine applied to wound bed Wound Treatment Wound #2 - Forearm Wound Laterality: Right Cleanser: Byram Ancillary Kit - 15 Day Supply (Generic) 1 x Per Day/30 Days Discharge Instructions: Use supplies as instructed; Kit contains: (15) Saline Bullets; (15) 3x3 Gauze; 15 pr Gloves Cleanser: Normal Saline 1 x Per Day/30 Days Discharge Instructions: Wash your hands with soap and water. Remove old dressing, discard into plastic bag and place into trash. Cleanse the wound with Normal Saline prior to applying a clean dressing using gauze sponges, not tissues or cotton balls. Do not scrub or use excessive force. Pat dry using gauze sponges, not tissue or cotton balls. Topical: Mupirocin Ointment 1 x Per Day/30 Days Discharge Instructions: Apply as directed by provider. Secondary Dressing: Bordered Gauze Sterile-HBD 4x4 (in/in) 1 x Per Day/30 Days Discharge Instructions: Cover wound with Bordered Guaze Sterile as directed Secured With: Stretch Net, Size 4, 10 (yds) 1 x Per Day/30 Days Electronic Signature(s) Signed: 10/27/2021 12:35:55 PM By: Carlene Coria RN Signed: 10/28/2021 4:11:43 PM By: Worthy Keeler PA-C Entered By: Carlene Coria on 10/27/2021 12:35:55 Randall Vincent ([id number]) -------------------------------------------------------------------------------- Problem List Details Patient Name: Randall Vincent Date of Service: 10/27/2021 11:00 AM Medical Record Number: [id number] Patient Account Number: [id number] Date of Birth/Sex: January 10, 1941 (80 y.o. M) Treating RN: Carlene Coria Primary Care Provider: Elio Forget Other Clinician: Referring Provider: Elio Forget Treating Provider/Extender: Skipper Cliche in Treatment: 7 Active Problems ICD-10 Encounter Code Description Active Date MDM Diagnosis [phone] 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d 09/08/2021 No Yes S41.101A Unspecified open wound of right upper arm, initial encounter  09/08/2021 No Yes S40.811A Abrasion of right upper arm, initial encounter 09/08/2021 No Yes I25.10 Atherosclerotic heart disease of native coronary artery without angina 09/08/2021 No Yes pectoris E11.622 Type 2 diabetes mellitus with other skin ulcer 09/08/2021 No Yes N18.31 Chronic kidney disease, stage 3a 09/08/2021 No Yes I50.9 Heart failure, unspecified 09/08/2021 No Yes Inactive Problems Resolved Problems Electronic Signature(s) Signed: 10/27/2021 11:31:25 AM By: Worthy Keeler PA-C Entered By: Worthy Randall Vincent on 10/27/2021  11:31:25 KSEAN, VALE ([id number]) -------------------------------------------------------------------------------- Progress Note Details Patient Name: JAQUAVEON, BILAL Date of Service: 10/27/2021 11:00 AM Medical Record Number: [id number] Patient Account Number: [id number] Date of Birth/Sex: 1940/07/28 (80 y.o. M) Treating RN: Carlene Coria Primary Care Provider: Elio Forget Other Clinician: Referring Provider: Elio Forget Treating Provider/Extender: Skipper Cliche in Treatment: 7 Subjective Chief Complaint Information obtained from Patient 09/08/2021; wound to the right upper extremity due to trauma History of Present Illness (HPI) Admission 09/08/2021 Mr. Simran Mannis is a 81 year old male with a past medical history of congestive heart failure, CKD stage III, type 2 diabetes currently controlled on oral medications and coronary artery disease status post CABG x2 that presents the clinic for a wound on his right arm for the past two weeks. He states he was walking his dog and Got tripped over the leash and fell creating an abrasion to his right arm. He states he visited urgent care and they helped stop the bleeding. He has not been using any wound care dressings but has been keeping the area covered with a Band-Aid. He currently denies signs of infection. 4/26; patient presents for follow-up. He has been using Xeroform dressings without issues. He reports  improvement in wound healing. He has no issues or complaints today. 5/3; patient presents for follow-up. He has been using Xeroform without issues. Unfortunately he fell yesterday and developed a skin tear to his right upper extremity. He visited the ED for this issue. They placed 50 sutures. He was started on Keflex. 5/10; patient presents for follow-up. He has Xeroform on the wound bed today. We discussed using antibiotic ointment however I am not sure if he has been doing this. He has no issues or complaints today. He denies signs of infection. 5/17; patient presents for follow-up. He has been using mupirocin to the wound bed. He is having trouble doing dressing changes with Xeroform. He denies signs of infection. 5/24; patient presents for follow-up. He has been using mupirocin to the wound bed without issues. This is an easy dressing for him to do on his own. He is pleased with his wound healing so far. He denies signs of infection. 5/31; patient presents for follow-up. He continues to put mupirocin on the wound bed. He has no issues or concerns today. He currently denies signs of infection. 10-27-2021 upon evaluation today patient's wound is almost completely healed. He has been using mupirocin and a Band-Aid and overall honestly I think he is doing quite well. He is very close to complete resolution which is great news. Objective Constitutional Well-nourished and well-hydrated in no acute distress. Vitals Time Taken: 11:02 AM, Height: 68 in, Weight: 210 lbs, BMI: 31.9, Temperature: 98.3 F, Pulse: 59 bpm, Respiratory Rate: 18 breaths/min, Blood Pressure: 132/54 mmHg. Respiratory normal breathing without difficulty. Psychiatric this patient is able to make decisions and demonstrates good insight into disease process. Alert and Oriented x 3. pleasant and cooperative. General Notes: Upon inspection patient's wound bed actually showed signs of good granulation and epithelization at this  point. Fortunately I do not see any evidence of infection locally or systemically which is great news and overall I am extremely pleased with where we stand today. Integumentary (Hair, Skin) Wound #2 status is Open. Original cause of wound was Skin Tear/Laceration. The date acquired was: 09/21/2021. The wound has been in treatment 5 weeks. The wound is located on the Right Forearm. The wound measures 0.7cm length x 0.3cm width x 0.1cm depth; 0.165cm^2 area and TRESTIN, VENCES ([id number]) 0.016cm^3  volume. There is Fat Layer (Subcutaneous Tissue) exposed. There is no tunneling or undermining noted. There is a medium amount of serosanguineous drainage noted. There is medium (34-66%) red granulation within the wound bed. There is a medium (34-66%) amount of necrotic tissue within the wound bed including Adherent Slough. Assessment Active Problems ICD-10 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d Unspecified open wound of right upper arm, initial encounter Abrasion of right upper arm, initial encounter Atherosclerotic heart disease of native coronary artery without angina pectoris Type 2 diabetes mellitus with other skin ulcer Chronic kidney disease, stage 3a Heart failure, unspecified Plan Follow-up Appointments: Return Appointment in 1 week. Bathing/ Shower/ Hygiene: Clean wound with Normal Saline or wound cleanser. Anesthetic (Use 'Patient Medications' Section for Anesthetic Order Entry): Lidocaine applied to wound bed WOUND #2: - Forearm Wound Laterality: Right Cleanser: Byram Ancillary Kit - 15 Day Supply (Generic) 1 x Per Day/30 Days Discharge Instructions: Use supplies as instructed; Kit contains: (15) Saline Bullets; (15) 3x3 Gauze; 15 pr Gloves Cleanser: Normal Saline 1 x Per Day/30 Days Discharge Instructions: Wash your hands with soap and water. Remove old dressing, discard into plastic bag and place into trash. Cleanse the wound with Normal Saline  prior to applying a clean dressing using gauze sponges, not tissues or cotton balls. Do not scrub or use excessive force. Pat dry using gauze sponges, not tissue or cotton balls. Topical: Mupirocin Ointment 1 x Per Day/30 Days Discharge Instructions: Apply as directed by provider. Secondary Dressing: Bordered Gauze Sterile-HBD 4x4 (in/in) 1 x Per Day/30 Days Discharge Instructions: Cover wound with Bordered Guaze Sterile as directed Secured With: Stretch Net, Size 4, 10 (yds) 1 x Per Day/30 Days 1. I would recommend that we continue with the current wound care measures. This includes the mupirocin ointment which I do believe is doing well followed by the border gauze dressing. 2. Mother recommend he continue to change this as he has been doing overall I think he is doing quite well and hopefully by next week this will be completely healed. We will see patient back for reevaluation in 1 week here in the clinic. If anything worsens or changes patient will contact our office for additional recommendations. Electronic Signature(s) Signed: 10/27/2021 12:52:17 PM By: Worthy Keeler PA-C Entered By: Worthy Randall Vincent on 10/27/2021 12:52:17 Randall Vincent ([id number]) -------------------------------------------------------------------------------- SuperBill Details Patient Name: Randall Vincent Date of Service: 10/27/2021 Medical Record Number: [id number] Patient Account Number: [id number] Date of Birth/Sex: 11-Oct-1940 (80 y.o. M) Treating RN: Carlene Coria Primary Care Provider: Elio Forget Other Clinician: Referring Provider: Elio Forget Treating Provider/Extender: Skipper Cliche in Treatment: 7 Diagnosis Coding ICD-10 Codes Code Description [phone] 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d S41.101A Unspecified open wound of right upper arm, initial encounter S40.811A Abrasion of right upper arm, initial encounter I25.10 Atherosclerotic heart disease of native  coronary artery without angina pectoris E11.622 Type 2 diabetes mellitus with other skin ulcer N18.31 Chronic kidney disease, stage 3a I50.9 Heart failure, unspecified Facility Procedures CPT4 Code: [id number] Description: [phone] - WOUND CARE VISIT-LEV 2 EST PT Modifier: Quantity: 1 Physician Procedures CPT4 Code: [id number] Description: 99213 - WC PHYS LEVEL 3 - EST PT Modifier: Quantity: 1 CPT4 Code: Description: ICD-10 Diagnosis Description L98.492 Non-pressure chronic ulcer of skin of other sites with fat layer expos S41.101A Unspecified open wound of right upper arm, initial encounter S40.811A Abrasion of right upper arm, initial encounter I25.10  Atherosclerotic heart disease  of native coronary artery without angina Modifier: ed pectoris Quantity: Electronic Signature(s) Signed: 10/27/2021 12:53:12 PM By: Worthy Keeler PA-C Previous Signature: 10/27/2021 12:36:41 PM Version By: Carlene Coria RN Entered By: Worthy Randall Vincent on 10/27/2021 12:53:12

## 2021-10-29 NOTE — Progress Notes (Signed)
TIMONTHY, HOVATER ([id number]) Visit Report for 10/27/2021 Arrival Information Details Patient Name: Randall Vincent, Randall Vincent Date of Service: 10/27/2021 11:00 AM Medical Record Number: [id number] Patient Account Number: [id number] Date of Birth/Sex: 03/01/1941 (80 y.o. M) Treating RN: Carlene Coria Primary Care Zsazsa Bahena: Elio Forget Other Clinician: Referring Semaje Kinker: Elio Forget Treating Akshith Moncus/Extender: Skipper Cliche in Treatment: 7 Visit Information History Since Last Visit All ordered tests and consults were completed: No Patient Arrived: Kasandra Knudsen Added or deleted any medications: No Arrival Time: 10:58 Any new allergies or adverse reactions: No Accompanied By: self Had a fall or experienced change in No Transfer Assistance: None activities of daily living that may affect Patient Identification Verified: Yes risk of falls: Secondary Verification Process Completed: Yes Signs or symptoms of abuse/neglect since last visito No Patient Requires Transmission-Based No Hospitalized since last visit: No Precautions: Implantable device outside of the clinic excluding No Patient Has Alerts: Yes cellular tissue based products placed in the center Patient Alerts: Patient on Blood since last visit: Thinner Has Dressing in Place as Prescribed: Yes Eliquis Pain Present Now: No Diabetic Electronic Signature(s) Signed: 10/29/2021 9:45:44 AM By: Carlene Coria RN Entered By: Carlene Coria on 10/27/2021 11:02:39 Randall Vincent ([id number]) -------------------------------------------------------------------------------- Clinic Level of Care Assessment Details Patient Name: Randall Vincent Date of Service: 10/27/2021 11:00 AM Medical Record Number: [id number] Patient Account Number: [id number] Date of Birth/Sex: [birth year] (80 y.o. M) Treating RN: Carlene Coria Primary Care Dorrie Cocuzza: Elio Forget Other Clinician: Referring Markevious Ehmke: Elio Forget Treating Tesean Stump/Extender: Skipper Cliche in  Treatment: 7 Clinic Level of Care Assessment Items TOOL 4 Quantity Score X - Use when only an EandM is performed on FOLLOW-UP visit 1 0 ASSESSMENTS - Nursing Assessment / Reassessment X - Reassessment of Co-morbidities (includes updates in patient status) 1 10 X- 1 5 Reassessment of Adherence to Treatment Plan ASSESSMENTS - Wound and Skin Assessment / Reassessment X - Simple Wound Assessment / Reassessment - one wound 1 5 _0  - 0 Complex Wound Assessment / Reassessment - multiple wounds _1  - 0 Dermatologic / Skin Assessment (not related to wound area) ASSESSMENTS - Focused Assessment _2  - Circumferential Edema Measurements - multi extremities 0 _3  - 0 Nutritional Assessment / Counseling / Intervention _4  - 0 Lower Extremity Assessment (monofilament, tuning fork, pulses) _5  - 0 Peripheral Arterial Disease Assessment (using hand held doppler) ASSESSMENTS - Ostomy and/or Continence Assessment and Care _6  - Incontinence Assessment and Management 0 _7  - 0 Ostomy Care Assessment and Management (repouching, etc.) PROCESS - Coordination of Care X - Simple Patient / Family Education for ongoing care 1 15 _8  - 0 Complex (extensive) Patient / Family Education for ongoing care _9  - 0 Staff obtains Programmer, systems, Records, Test Results / Process Orders _10  - 0 Staff telephones HHA, Nursing Homes / Clarify orders / etc _11  - 0 Routine Transfer to another Facility (non-emergent condition) _12  - 0 Routine Hospital Admission (non-emergent condition) _13  - 0 New Admissions / Biomedical engineer / Ordering NPWT, Apligraf, etc. _14  - 0 Emergency Hospital Admission (emergent condition) X- 1 10 Simple Discharge Coordination _15  - 0 Complex (extensive) Discharge Coordination PROCESS - Special Needs _16  - Pediatric / Minor Patient Management 0 _17  - 0 Isolation Patient Management _18  - 0 Hearing / Language / Visual special needs _19  - 0 Assessment of Community assistance (transportation, D/C  planning, etc.) _20  - 0 Additional assistance / Altered mentation _21  - 0 Support Surface(s) Assessment (bed, cushion, seat, etc.) INTERVENTIONS - Wound Cleansing / Measurement [place], Jaques ([id number]) X- 1 5 Simple  Wound Cleansing - one wound _0  - 0 Complex Wound Cleansing - multiple wounds X- 1 5 Wound Imaging (photographs - any number of wounds) _1  - 0 Wound Tracing (instead of photographs) X- 1 5 Simple Wound Measurement - one wound _2  - 0 Complex Wound Measurement - multiple wounds INTERVENTIONS - Wound Dressings X - Small Wound Dressing one or multiple wounds 1 10 _3  - 0 Medium Wound Dressing one or multiple wounds _4  - 0 Large Wound Dressing one or multiple wounds <GQQPYPPJKDTOIZTI>_4<\/PYKDXIPJASNKNLZJ>_6  - 0 Application of Medications - topical <BHALPFXTKWIOXBDZ>_3<\/GDJMEQASTMHDQQIW>_9  - 0 Application of Medications - injection INTERVENTIONS - Miscellaneous _7  - External ear exam 0 _8  - 0 Specimen Collection (cultures, biopsies, blood, body fluids, etc.) _9  - 0 Specimen(s) / Culture(s) sent or taken to Lab for analysis _10  - 0 Patient Transfer (multiple staff / Civil Service fast streamer / Similar devices) _11  - 0 Simple Staple / Suture removal (25 or less) _12  - 0 Complex Staple / Suture removal (26 or more) _13  - 0 Hypo / Hyperglycemic Management (close monitor of Blood Glucose) _14  - 0 Ankle / Brachial Index (ABI) - do not check if billed separately X- 1 5 Vital Signs Has the patient been seen at the hospital within the last three years: Yes Total Score: 75 Level Of Care: New/Established - Level 2 Electronic Signature(s) Signed: 10/29/2021 9:45:44 AM By: Carlene Coria RN Entered By: Carlene Coria on 10/27/2021 12:36:31 Randall Vincent ([id number]) -------------------------------------------------------------------------------- Encounter Discharge Information Details Patient Name: Randall Vincent Date of Service: 10/27/2021 11:00 AM Medical Record Number: [id number] Patient Account Number: [id number] Date of Birth/Sex: Feb 16, 1941 (80 y.o. M) Treating RN:  Carlene Coria Primary Care Laquinton Bihm: Elio Forget Other Clinician: Referring Niguel Moure: Elio Forget Treating Khaya Theissen/Extender: Skipper Cliche in Treatment: 7 Encounter Discharge Information Items Discharge Condition: Stable Ambulatory Status: Cane Discharge Destination: Home Transportation: Private Auto Accompanied By: self Schedule Follow-up Appointment: Yes Clinical Summary of Care: Electronic Signature(s) Signed: 10/27/2021 12:37:24 PM By: Carlene Coria RN Entered By: Carlene Coria on 10/27/2021 12:37:24 Randall Vincent ([id number]) -------------------------------------------------------------------------------- Lower Extremity Assessment Details Patient Name: Randall Vincent Date of Service: 10/27/2021 11:00 AM Medical Record Number: [id number] Patient Account Number: [id number] Date of Birth/Sex: [birth year] (80 y.o. M) Treating RN: Carlene Coria Primary Care Jennika Ringgold: Elio Forget Other Clinician: Referring Froilan Mclean: Elio Forget Treating Fransico Sciandra/Extender: Skipper Cliche in Treatment: 7 Electronic Signature(s) Signed: 10/29/2021 9:45:44 AM By: Carlene Coria RN Entered By: Carlene Coria on 10/27/2021 11:07:17 Randall Vincent ([id number]) -------------------------------------------------------------------------------- Multi Wound Chart Details Patient Name: Randall Vincent Date of Service: 10/27/2021 11:00 AM Medical Record Number: [id number] Patient Account Number: [id number] Date of Birth/Sex: [birth year] (80 y.o. M) Treating RN: Carlene Coria Primary Care Keyly Baldonado: Elio Forget Other Clinician: Referring Taron Mondor: Elio Forget Treating Londynn Sonoda/Extender: Skipper Cliche in Treatment: 7 Vital Signs Height(in): 68 Pulse(bpm): 4 Weight(lbs): 210 Blood Pressure(mmHg): 132/54 Body Mass Index(BMI): 31.9 Temperature(F): 98.3 Respiratory Rate(breaths/min): 18 Photos: [N/A:N/A] Wound Location: Right Forearm N/A N/A Wounding Event: Skin Tear/Laceration N/A N/A Primary  Etiology: Skin Tear N/A N/A Comorbid History: Arrhythmia, Congestive Heart N/A N/A Failure, Coronary Artery Disease, Hypertension, Type II Diabetes Date Acquired: 09/21/2021 N/A N/A Weeks of Treatment: 5 N/A N/A Wound Status: Open N/A N/A Wound Recurrence: No N/A N/A Clustered Wound: Yes N/A N/A Measurements L x W x D (cm) 0.7x0.3x0.1 N/A N/A Area (cm) : 0.165 N/A N/A Volume (cm) : 0.016 N/A N/A % Reduction in Area: 99.30% N/A N/A % Reduction in Volume: 99.30% N/A N/A Classification: Full Thickness Without Exposed N/A N/A Support Structures  Exudate Amount: Medium N/A N/A Exudate Type: Serosanguineous N/A N/A Exudate Color: red, brown N/A N/A Granulation Amount: Medium (34-66%) N/A N/A Granulation Quality: Red N/A N/A Necrotic Amount: Medium (34-66%) N/A N/A Exposed Structures: Fat Layer (Subcutaneous Tissue): N/A N/A Yes Fascia: No Tendon: No Muscle: No Joint: No Bone: No Epithelialization: Small (1-33%) N/A N/A Treatment Notes Electronic Signature(s) Signed: 10/29/2021 9:45:44 AM By: Carlene Coria RN Entered By: Carlene Coria on 10/27/2021 11:07:29 Randall Vincent ([id number]) CHRISTIN, MCCREEDY ([id number]) -------------------------------------------------------------------------------- Multi-Disciplinary Care Plan Details Patient Name: Randall Vincent Date of Service: 10/27/2021 11:00 AM Medical Record Number: [id number] Patient Account Number: [id number] Date of Birth/Sex: 11-Jan-1941 (80 y.o. M) Treating RN: Carlene Coria Primary Care Trice Aspinall: Elio Forget Other Clinician: Referring Andry Bogden: Elio Forget Treating Sharif Rendell/Extender: Skipper Cliche in Treatment: 7 Active Inactive Abuse / Safety / Falls / Self Care Management Nursing Diagnoses: History of Falls Impaired physical mobility Potential for falls Potential for injury related to falls Goals: Patient will not experience any injury related to falls Date Initiated: 09/22/2021 Target Resolution Date:  10/29/2021 Goal Status: Active Patient/caregiver will verbalize understanding of skin care regimen Date Initiated: 09/22/2021 Target Resolution Date: 10/29/2021 Goal Status: Active Interventions: Assess impairment of mobility on admission and as needed per policy Provide education on basic hygiene Provide education on fall prevention Treatment Activities: Education provided on Basic Hygiene : 10/20/2021 Notes: Electronic Signature(s) Signed: 10/29/2021 9:45:44 AM By: Carlene Coria RN Entered By: Carlene Coria on 10/27/2021 11:07:22 Randall Vincent ([id number]) -------------------------------------------------------------------------------- Pain Assessment Details Patient Name: Randall Vincent Date of Service: 10/27/2021 11:00 AM Medical Record Number: [id number] Patient Account Number: [id number] Date of Birth/Sex: 21-Sep-1940 (80 y.o. M) Treating RN: Carlene Coria Primary Care Kymir Coles: Elio Forget Other Clinician: Referring Klye Besecker: Elio Forget Treating Kateena Degroote/Extender: Skipper Cliche in Treatment: 7 Active Problems Location of Pain Severity and Description of Pain Patient Has Paino No Site Locations Pain Management and Medication Current Pain Management: Electronic Signature(s) Signed: 10/29/2021 9:45:44 AM By: Carlene Coria RN Entered By: Carlene Coria on 10/27/2021 11:03:01 Randall Vincent ([id number]) -------------------------------------------------------------------------------- Patient/Caregiver Education Details Patient Name: Randall Vincent Date of Service: 10/27/2021 11:00 AM Medical Record Number: [id number] Patient Account Number: [id number] Date of Birth/Gender: Aug 04, 1940 (80 y.o. M) Treating RN: Carlene Coria Primary Care Physician: Elio Forget Other Clinician: Referring Physician: Elio Forget Treating Physician/Extender: Skipper Cliche in Treatment: 7 Education Assessment Education Provided To: Patient Education Topics Provided Safety: Methods:  Explain/Verbal Responses: State content correctly Electronic Signature(s) Signed: 10/29/2021 9:45:44 AM By: Carlene Coria RN Entered By: Carlene Coria on 10/27/2021 12:36:50 Randall Vincent ([id number]) -------------------------------------------------------------------------------- Wound Assessment Details Patient Name: Randall Vincent Date of Service: 10/27/2021 11:00 AM Medical Record Number: [id number] Patient Account Number: [id number] Date of Birth/Sex: 13-May-1941 (80 y.o. M) Treating RN: Carlene Coria Primary Care Desmon Hitchner: Elio Forget Other Clinician: Referring Marsheila Alejo: Elio Forget Treating Allis Quirarte/Extender: Skipper Cliche in Treatment: 7 Wound Status Wound Number: 2 Primary Skin Tear Etiology: Wound Location: Right Forearm Wound Open Wounding Event: Skin Tear/Laceration Status: Date Acquired: 09/21/2021 Comorbid Arrhythmia, Congestive Heart Failure, Coronary Artery Weeks Of Treatment: 5 History: Disease, Hypertension, Type II Diabetes Clustered Wound: Yes Photos Wound Measurements Length: (cm) 0.7 Width: (cm) 0.3 Depth: (cm) 0.1 Area: (cm) 0.165 Volume: (cm) 0.016 % Reduction in Area: 99.3% % Reduction in Volume: 99.3% Epithelialization: Small (1-33%) Tunneling: No Undermining: No Wound Description Classification: Full Thickness Without Exposed Support Structures Exudate Amount: Medium Exudate Type: Serosanguineous Exudate Color: red, brown Foul Odor After Cleansing: No Slough/Fibrino Yes Wound Bed Granulation Amount: Medium (34-66%)  Exposed Structure Granulation Quality: Red Fascia Exposed: No Necrotic Amount: Medium (34-66%) Fat Layer (Subcutaneous Tissue) Exposed: Yes Necrotic Quality: Adherent Slough Tendon Exposed: No Muscle Exposed: No Joint Exposed: No Bone Exposed: No Treatment Notes Wound #2 (Forearm) Wound Laterality: Right Cleanser [place] Ancillary Kit - 15 Day Supply Discharge Instruction: Use supplies as instructed; Kit contains: (15)  Saline Bullets; (15) 3x3 Gauze; 15 pr Gloves Normal Saline Discharge Instruction: Wash your hands with soap and water. Remove old dressing, discard into plastic bag and place into trash. Cleanse the wound with Normal Saline prior to applying a clean dressing using gauze sponges, not tissues or cotton balls. Do not NATE, COMMON ([id number]) scrub or use excessive force. Pat dry using gauze sponges, not tissue or cotton balls. Peri-Wound Care Topical Mupirocin Ointment Discharge Instruction: Apply as directed by Cleon Thoma. Primary Dressing Secondary Dressing Bordered Gauze Sterile-HBD 4x4 (in/in) Discharge Instruction: Cover wound with Bordered Guaze Sterile as directed Secured With Stretch Net, Size 4, 10 (yds) Compression Wrap Compression Stockings Add-Ons Electronic Signature(s) Signed: 10/29/2021 9:45:44 AM By: Carlene Coria RN Entered By: Carlene Coria on 10/27/2021 11:06:59 Randall Vincent ([id number]) -------------------------------------------------------------------------------- Vitals Details Patient Name: Randall Vincent Date of Service: 10/27/2021 11:00 AM Medical Record Number: [id number] Patient Account Number: [id number] Date of Birth/Sex: 06/12/1940 (80 y.o. M) Treating RN: Carlene Coria Primary Care Arshad Oberholzer: Elio Forget Other Clinician: Referring Leela Vanbrocklin: Elio Forget Treating Viktoriya Glaspy/Extender: Skipper Cliche in Treatment: 7 Vital Signs Time Taken: 11:02 Temperature (F): 98.3 Height (in): 68 Pulse (bpm): 59 Weight (lbs): 210 Respiratory Rate (breaths/min): 18 Body Mass Index (BMI): 31.9 Blood Pressure (mmHg): 132/54 Reference Range: 80 - 120 mg / dl Electronic Signature(s) Signed: 10/29/2021 9:45:44 AM By: Carlene Coria RN Entered By: Carlene Coria on 10/27/2021 11:02:54

## 2021-10-29 NOTE — Progress Notes (Shared)
[facility] - Clinic Note  10/04/2021     CHIEF COMPLAINT Patient presents for Retina Evaluation   HISTORY OF PRESENT ILLNESS: Randall Vincent is a 81 y.o. male who presents to the clinic today for:  HPI     Retina Follow Up   Patient presents with  Wet AMD (IVA OD #1 (05.15.23)).  In both eyes.  This started weeks ago.  Duration of 4 weeks.  I, the attending physician,  performed the HPI with the patient and updated documentation appropriately.        Comments   Patient feels that the vision has been stable since his last visit.       Last edited by Rennis Chris, MD on 11/01/2021 12:35 PM.     Patient updated his glasses since last visit.   Referring physician: [name] [address] [place], [place] [address]   HISTORICAL INFORMATION:   Selected notes from the MEDICAL RECORD NUMBER Referred by Dr. [name] for peripapillary CNV LEE: 09/21/21 BCVA OD 20/50 OS 20/40 Ocular Hx- peripapillary CNV, cataracts, DM without retinopathy, HTN retinopathy, exotropia OS PMH- DM, HTN; last a1c was 6.0 on 05.06.23    CURRENT MEDICATIONS: No current outpatient medications on file. (Ophthalmic Drugs)   No current facility-administered medications for this visit. (Ophthalmic Drugs)   Current Outpatient Medications (Other)  Medication Sig   amiodarone (PACERONE) 200 MG tablet Take 200 mg by mouth daily.   apixaban (ELIQUIS) 5 MG TABS tablet Take 1 tablet (5 mg total) by mouth 2 (two) times daily.   apixaban (ELIQUIS) 5 MG TABS tablet Take by mouth.   atorvastatin (LIPITOR) 40 MG tablet Take 40 mg by mouth every evening.    carvedilol (COREG) 3.125 MG tablet Take 1 tablet (3.125 mg total) by mouth 2 (two) times daily with a meal.   colchicine 0.6 MG tablet Take 0.6 mg by mouth 2 (two) times daily.   diltiazem (CARDIZEM CD) 240 MG 24 hr capsule Take 240 mg by mouth daily.   docusate sodium (COLACE) 100 MG capsule Take 1 capsule (100 mg total) by mouth 2 (two)  times daily.   ferrous sulfate 325 (65 FE) MG tablet Take 325 mg by mouth daily with breakfast.   HYDROcodone-acetaminophen (NORCO/VICODIN) 5-325 MG tablet Take 1 tablet by mouth every 4 (four) hours as needed for severe pain.   metFORMIN (GLUCOPHAGE-XR) 500 MG 24 hr tablet Take 500 mg by mouth 2 (two) times daily.   ramipril (ALTACE) 10 MG capsule Take 1 capsule (10 mg total) by mouth daily.   Semaglutide, 1 MG/DOSE, 2 MG/1.5ML SOPN Inject 1 mg into the skin every Thursday.   torsemide (DEMADEX) 20 MG tablet Take 1 tablet (20 mg total) by mouth 2 (two) times daily. (Patient taking differently: Take 10-20 mg by mouth See admin instructions. Take 20 mg by mouth in the morning and 10 mg in the evening)   traMADol (ULTRAM) 50 MG tablet Take 1 tablet (50 mg total) by mouth every 8 (eight) hours as needed for moderate pain or severe pain. (Patient taking differently: Take 50 mg by mouth 4 (four) times daily as needed for moderate pain or severe pain.)   vitamin B-12 (CYANOCOBALAMIN) 1000 MCG tablet Take 1,000 mcg by mouth daily.   No current facility-administered medications for this visit. (Other)   REVIEW OF SYSTEMS: ROS   Positive for: Genitourinary, Endocrine, Cardiovascular, Eyes, Respiratory Negative for: Constitutional, Gastrointestinal, Neurological, Skin, Musculoskeletal, HENT, Psychiatric, Allergic/Imm, Heme/Lymph Last  edited by [name], COA on 10/04/2021  9:53 AM.     ALLERGIES No Known Allergies  PAST MEDICAL HISTORY Past Medical History:  Diagnosis Date   A-fib [facility])    a.) CHA2DS2-VASc = 6 (age x 2, CHF, HTN, previous MI, T2DM). b.) Rate/rhythm maintained on amiodarone + diltiazem + carvedilol; chronically anticoagulated using dabigatran. c.) DCCV 07/10/2015, 08/08/2019, 10/22/2019.   Arthritis    Chest pain 06/26/2019   CHF (congestive heart failure) (HCC)    CKD (chronic kidney disease), stage III (HCC)    Coronary artery disease 1991   a.) 2v CABG in 1991. b.) PCI  06/07/2006 --> Vision stents to native LCx and RCA   Hyperlipidemia    Hypertension    Long term current use of anticoagulant    a.) dabigatran   Myocardial infarction (HCC) 1991   a.) LHC --> 50% and 90% LAD lesions; referred to CVTS. 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   a.) LIMA-LAD, SVG-RCA   T2DM (type 2 diabetes mellitus) (HCC)    Past Surgical History:  Procedure Laterality Date   BACK SURGERY  1970   L5-L6 ruptured disk   BLADDER SURGERY  2000   CARDIOVERSION N/A 07/10/2015   CARDIOVERSION N/A 08/08/2019   CARDIOVERSION N/A 10/22/2019   COLON SURGERY     COLONOSCOPY N/A 01/15/2020   Procedure: COLONOSCOPY;  Surgeon: Pasty Spillers, MD;  Location: [facility] ENDOSCOPY;  Service: Endoscopy;  Laterality: N/A;   COLONOSCOPY N/A 01/16/2020   Procedure: COLONOSCOPY;  Surgeon: Pasty Spillers, MD;  Location: [facility] ENDOSCOPY;  Service: Endoscopy;  Laterality: N/A;   CORONARY ARTERY BYPASS GRAFT N/A 01/02/1990   Procedure: 2v CORONARY ARTERY BYPASS GRAFT (LIMA-LAD, SVG-RCA)   ESOPHAGOGASTRODUODENOSCOPY N/A 01/13/2020   Procedure: ESOPHAGOGASTRODUODENOSCOPY (EGD);  Surgeon: Pasty Spillers, MD;  Location: [facility] ENDOSCOPY;  Service: Endoscopy;  Laterality: N/A;   REPLACEMENT TOTAL KNEE BILATERAL Bilateral 2008   STENT PLACEMENT VASCULAR ([facility] HX)  2008   TOTAL KNEE REVISION Left 01/16/2020   Procedure: TOTAL KNEE REVISION;  Surgeon: Lyndle Herrlich, MD;  Location: [facility] ORS;  Service: Orthopedics;  Laterality: Left;   TOTAL KNEE REVISION Left 05/31/2021   Procedure: TOTAL KNEE REVISION;  Surgeon: Lyndle Herrlich, MD;  Location: [facility] ORS;  Service: Orthopedics;  Laterality: Left;   FAMILY HISTORY History reviewed. No pertinent family history.  SOCIAL HISTORY Social History   Tobacco Use   Smoking status: Former    Types: Cigarettes    Quit date: 1991    Years since quitting: 32.3   Smokeless tobacco: Never   Vaping Use   Vaping Use: Never used  Substance Use Topics   Alcohol use: Yes    Alcohol/week: 1.0 standard drink    Types: 1 Cans of beer per week    Comment: very rarely       OPHTHALMIC EXAM:  Base Eye Exam     Visual Acuity (Snellen - Linear)       Right Left   Dist cc 20/40 LP   Dist ph cc NI     Correction: Glasses         Tonometry (Tonopen, 9:33 AM)       Right Left   Pressure 14 def         Pupils       Dark Light Shape React APD   Right 3 2 Round Minimal None   Left unable  Irregular unable   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           Slit Lamp and Fundus Exam     External Exam       Right Left   External ecchymosis Normal         Slit Lamp Exam       Right Left   Lids/Lashes Dermatochalasis - upper lid Dermatochalasis - upper lid, Meibomian gland dysfunction   Conjunctiva/Sclera White and quiet White and quiet   Cornea trace PEE, mild arcus trace PEE, large fibrotic scar inferiorly   Anterior Chamber deep, clear, narrow temporal angle abnormal   Iris Round and dilated, No NVI Irregular pupil with traumatic iridectomy inferiorly   Lens 2+ Nuclear sclerosis with brunescence, 2+ Cortical cataract Traumatic cataract   Anterior Vitreous Vitreous syneresis limited view - clear         Fundus Exam       Right Left   Disc Pink and Sharp, Compact, peripapillary CNV with surrounding SRF Pink and Sharp, Compact, temporal PPA   C/D Ratio  0.2 0.3   Macula Flat, Good foveal reflex, Drusen, RPE mottling and clumping, peripapillary CNV with surrounding SRF, punctate IRH Flat, Blunted foveal reflex   Vessels attenuated, mild tortuosity attenuated   Periphery Attached Limited view, grossly attached, no heme           Refraction     Wearing Rx       Sphere Cylinder Axis Add   Right -1.00 +1.50 001 +2.00   Left Balance     Has new glasses on order.        Manifest Refraction       Sphere Cylinder Axis Dist VA   Right -2.00 +2.25 175 20/50   Left                IMAGING AND PROCEDURES  Imaging and Procedures for 10/04/2021  OCT, Retina - OU - Both Eyes       Right Eye Quality was good. Central Foveal Thickness: 209. Progression has no prior data. Findings include normal foveal contour, no IRF, subretinal fluid, pigment epithelial detachment, subretinal hyper-reflective material (Peripapillary CNV with surrounding SRF).   Left Eye Findings include (No image obtained).   Notes *Images captured and stored on drive  Diagnosis / Impression:  OD: exu ARMD - Peripapillary CNV with surrounding SRF  Clinical management:  See below  Abbreviations: NFP - Normal foveal profile. CME - cystoid macular edema. PED - pigment epithelial detachment. IRF - intraretinal fluid. SRF - subretinal fluid. EZ - ellipsoid zone. ERM - epiretinal membrane. ORA - outer retinal atrophy. ORT - outer  retinal tubulation. SRHM - subretinal hyper-reflective material. IRHM - intraretinal hyper-reflective material      Intravitreal Injection, Pharmacologic Agent - OD - Right Eye       Time Out 10/04/2021. 10:26 AM. Confirmed correct patient, procedure, site, and patient consented.   Anesthesia Topical anesthesia was used. Anesthetic medications included Lidocaine 2%, Proparacaine 0.5%.   Procedure Preparation included 5% betadine to ocular surface, eyelid speculum. A (32g) needle was used.   Injection: 1.25 mg Bevacizumab  1.25mg /0.54ml   Route: Intravitreal, Site: Right Eye   NDC: [id number], Lot: [id number], Expiration date: 11/26/2021   Post-op Post injection exam found visual acuity of at least counting fingers. The patient tolerated the procedure well. There were no complications. The patient received written and verbal post procedure care education. Post injection medications were not given.            ASSESSMENT/PLAN:    ICD-10-CM   1. Exudative age-related macular degeneration of right eye with active choroidal neovascularization (HCC)  H35.3211 OCT, Retina - OU - Both Eyes    Intravitreal Injection, Pharmacologic Agent - OD - Right Eye    Bevacizumab (AVASTIN) SOLN 1.25 mg    2. Essential hypertension  I10     3. Hypertensive retinopathy of both eyes  H35.033     4. Diabetes mellitus type 2 without retinopathy (HCC)  E11.9     5. Low vision of left eye  H54.52A1     6. 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 Exotropia of left eye  H50.112     8. Combined forms of age-related cataract of right eye  H25.811      Exudative age related macular degeneration OD  - s/p IVA OD #1 (05.15.23)  - The incidence pathology and anatomy of wet AMD discussed   - The ANCHOR, MARINA, CATT and VIEW trials discussed with patient.    - discussed treatment options including observation vs intravitreal anti-VEGF agents such as Avastin, Lucentis, Eylea.    - Risks of endophthalmitis and vascular occlusive events and atrophic changes discussed with patient  - exam and OCT show peripapillary CNV with surrounding SRF OD  - BCVA 20/30 OD (new glasses)  - recommend IVA OD #2 today, 06.12.23  - pt wishes to be treated with IVA  - RBA of procedure discussed, questions answered - informed consent obtained and signed, 05.15.23 (OD) - see procedure note  - f/u in 4 wks, DFE, OCT, possible injection  2,3. Hypertensive retinopathy OU - discussed importance of tight BP control - monitor  4.  Diabetes mellitus, type 2 without retinopathy - The incidence, risk factors for progression, natural history and treatment options for diabetic retinopathy  were discussed with patient.   - The need for close monitoring of blood glucose, blood pressure, and serum lipids, avoiding cigarette or any type of tobacco, and the need for long term follow up was also discussed with patient.  - monitor  5-7. 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 Mixed Cataract OD - The symptoms of cataract, surgical options, and treatments and risks were discussed with patient. - discussed diagnosis and progression - likely visually  significant - monitor  Ophthalmic Meds Ordered this visit:  Meds ordered this encounter  Medications   Bevacizumab (AVASTIN) SOLN 1.25 mg     Return in about 4 weeks (around 11/01/2021) for f/u exu ARMD OU, DFE, OCT.  There are no Patient Instructions on file for this visit.  Explained the diagnoses, plan, and follow up with the patient and they expressed understanding.  Patient expressed understanding of the importance of proper follow up care.   This document serves as a record of services personally performed by Karie Chimera, MD, PhD. It was created on their behalf by Annalee Genta, COMT. The creation of this record is the provider's dictation and/or activities during the visit.  Electronically signed by: Annalee Genta, COMT 10/04/21 1:10 PM  This document serves as a record of services personally performed by Karie Chimera, MD, PhD. It was created on their behalf by [name], an ophthalmic technician. The creation of this record is the provider's dictation and/or activities during  the visit.    Electronically signed by: [name] COA, 11/01/21  12:43 PM   Karie Chimera, M.D., Ph.D. Diseases & Surgery of the Retina and Vitreous [facility]    Abbreviations: M myopia (nearsighted); A astigmatism; H hyperopia (farsighted); P presbyopia; Mrx spectacle prescription;  CTL contact lenses; OD right eye; OS left eye; OU both eyes  XT exotropia; ET esotropia; PEK punctate epithelial keratitis; PEE punctate epithelial erosions; DES dry eye syndrome; MGD meibomian gland dysfunction; ATs artificial tears; PFAT's preservative free artificial tears; NSC nuclear sclerotic cataract; PSC posterior subcapsular cataract; ERM epi-retinal membrane; PVD posterior vitreous detachment; RD retinal detachment; DM diabetes mellitus; DR diabetic retinopathy; NPDR non-proliferative diabetic retinopathy; PDR proliferative diabetic retinopathy; CSME clinically significant macular edema; DME diabetic macular edema; dbh dot blot hemorrhages; CWS cotton wool spot; POAG primary open angle glaucoma; C/D cup-to-disc ratio; HVF humphrey visual field; GVF goldmann visual field; OCT optical coherence tomography; IOP intraocular pressure; BRVO Branch retinal vein occlusion; CRVO central retinal vein occlusion; CRAO central retinal artery occlusion; BRAO branch retinal artery occlusion; RT retinal tear; SB scleral buckle; PPV pars plana vitrectomy; VH Vitreous hemorrhage; PRP panretinal laser photocoagulation; IVK intravitreal kenalog; VMT vitreomacular traction; MH Macular hole;  NVD neovascularization of the disc; NVE neovascularization elsewhere; AREDS age related eye disease study; ARMD age related macular degeneration; POAG primary open angle glaucoma; EBMD epithelial/anterior basement membrane dystrophy; ACIOL anterior chamber intraocular lens; IOL intraocular lens; PCIOL posterior chamber intraocular lens; Phaco/IOL phacoemulsification with intraocular lens placement; PRK photorefractive  keratectomy; LASIK laser assisted in situ keratomileusis; HTN hypertension; DM diabetes mellitus; COPD chronic obstructive pulmonary disease

## 2021-11-01 ENCOUNTER — Ambulatory Visit (INDEPENDENT_AMBULATORY_CARE_PROVIDER_SITE_OTHER): Payer: Medicare Other | Admitting: Ophthalmology

## 2021-11-01 ENCOUNTER — Encounter (INDEPENDENT_AMBULATORY_CARE_PROVIDER_SITE_OTHER): Payer: Self-pay | Admitting: Ophthalmology

## 2021-11-01 DIAGNOSIS — H353211 Exudative age-related macular degeneration, right eye, with active choroidal neovascularization: Secondary | ICD-10-CM | POA: Diagnosis not present

## 2021-11-01 DIAGNOSIS — H26122 Partially resolved traumatic cataract, left eye: Secondary | ICD-10-CM | POA: Diagnosis not present

## 2021-11-01 DIAGNOSIS — I10 Essential (primary) hypertension: Secondary | ICD-10-CM

## 2021-11-01 DIAGNOSIS — H5452A1 Low vision left eye category 1, normal vision right eye: Secondary | ICD-10-CM

## 2021-11-01 DIAGNOSIS — H25811 Combined forms of age-related cataract, right eye: Secondary | ICD-10-CM

## 2021-11-01 DIAGNOSIS — H35033 Hypertensive retinopathy, bilateral: Secondary | ICD-10-CM | POA: Diagnosis not present

## 2021-11-01 DIAGNOSIS — H50112 Monocular exotropia, left eye: Secondary | ICD-10-CM

## 2021-11-01 DIAGNOSIS — E119 Type 2 diabetes mellitus without complications: Secondary | ICD-10-CM

## 2021-11-01 MED ORDER — BEVACIZUMAB CHEMO INJECTION 1.25MG/0.05ML SYRINGE FOR KALEIDOSCOPE
1.2500 mg | INTRAVITREAL | Status: AC | PRN
  Administered 2021-11-01: 1.25 mg via INTRAVITREAL

## 2021-11-01 NOTE — Progress Notes (Signed)
[facility] - Clinic Note  11/01/2021     CHIEF COMPLAINT Patient presents for Retina Follow Up   HISTORY OF PRESENT ILLNESS: Randall Vincent is a 81 y.o. male who presents to the clinic today for:   HPI     Retina Follow Up   Patient presents with  Wet AMD (IVA OD #1 (05.15.23)).  In both eyes.  This started weeks ago.  Duration of 4 weeks.  I, the attending physician,  performed the HPI with the patient and updated documentation appropriately.        Comments   Patient feels that the vision has been stable since his last visit.       Last edited by Rennis Chris, MD on 11/01/2021 12:35 PM.     Patient updated his glasses since last visit.   Referring physician: [name] [address] [place], [place] [address]   HISTORICAL INFORMATION:   Selected notes from the MEDICAL RECORD NUMBER Referred by Dr. [name] for peripapillary CNV LEE: 09/21/21 BCVA OD 20/50 OS 20/40 Ocular Hx- peripapillary CNV, cataracts, DM without retinopathy, HTN retinopathy, exotropia OS PMH- DM, HTN; last a1c was 6.0 on 05.06.23    CURRENT MEDICATIONS: No current outpatient medications on file. (Ophthalmic Drugs)   No current facility-administered medications for this visit. (Ophthalmic Drugs)   Current Outpatient Medications (Other)  Medication Sig   amiodarone (PACERONE) 200 MG tablet Take 200 mg by mouth daily.   apixaban (ELIQUIS) 5 MG TABS tablet Take 1 tablet (5 mg total) by mouth 2 (two) times daily.   apixaban (ELIQUIS) 5 MG TABS tablet Take by mouth.   atorvastatin (LIPITOR) 40 MG tablet Take 40 mg by mouth every evening.    carvedilol (COREG) 3.125 MG tablet Take 1 tablet (3.125 mg total) by mouth 2 (two) times daily with a meal.   colchicine 0.6 MG tablet Take 0.6 mg by mouth 2 (two) times daily.   diltiazem (CARDIZEM CD) 240 MG 24 hr capsule Take 240 mg by mouth daily.   docusate sodium (COLACE) 100 MG capsule Take 1 capsule (100 mg total) by mouth 2 (two)  times daily.   ferrous sulfate 325 (65 FE) MG tablet Take 325 mg by mouth daily with breakfast.   HYDROcodone-acetaminophen (NORCO/VICODIN) 5-325 MG tablet Take 1 tablet by mouth every 4 (four) hours as needed for severe pain.   metFORMIN (GLUCOPHAGE-XR) 500 MG 24 hr tablet Take 500 mg by mouth 2 (two) times daily.   ramipril (ALTACE) 10 MG capsule Take 1 capsule (10 mg total) by mouth daily.   Semaglutide, 1 MG/DOSE, 2 MG/1.5ML SOPN Inject 1 mg into the skin every Thursday.   torsemide (DEMADEX) 20 MG tablet Take 1 tablet (20 mg total) by mouth 2 (two) times daily. (Patient taking differently: Take 10-20 mg by mouth See admin instructions. Take 20 mg by mouth in the morning and 10 mg in the evening)   traMADol (ULTRAM) 50 MG tablet Take 1 tablet (50 mg total) by mouth every 8 (eight) hours as needed for moderate pain or severe pain. (Patient taking differently: Take 50 mg by mouth 4 (four) times daily as needed for moderate pain or severe pain.)   vitamin B-12 (CYANOCOBALAMIN) 1000 MCG tablet Take 1,000 mcg by mouth daily.   No current facility-administered medications for this visit. (Other)   REVIEW OF SYSTEMS: ROS   Positive for: Genitourinary, Endocrine, Cardiovascular, Eyes, Respiratory Negative for: Constitutional, Gastrointestinal, Neurological, Skin, Musculoskeletal, HENT, Psychiatric, Allergic/Imm,  Heme/Lymph Last edited by Julieanne CottonShamlian, Christine N, COT on 11/01/2021  9:34 AM.     ALLERGIES No Known Allergies  PAST MEDICAL HISTORY Past Medical History:  Diagnosis Date   A-fib [facility](HCC)    a.) CHA2DS2-VASc = 6 (age x 2, CHF, HTN, previous MI, T2DM). b.) Rate/rhythm maintained on amiodarone + diltiazem + carvedilol; chronically anticoagulated using dabigatran. c.) DCCV 07/10/2015, 08/08/2019, 10/22/2019.   Arthritis    Chest pain 06/26/2019   CHF (congestive heart failure) (HCC)    CKD (chronic kidney disease), stage III (HCC)    Coronary artery disease 1991   a.) 2v CABG in 1991. b.)  PCI 06/07/2006 --> Vision stents to native LCx and RCA   Hyperlipidemia    Hypertension    Long term current use of anticoagulant    a.) dabigatran   Myocardial infarction (HCC) 1991   a.) LHC --> 50% and 90% LAD lesions; referred to CVTS. 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   a.) LIMA-LAD, SVG-RCA   T2DM (type 2 diabetes mellitus) (HCC)    Past Surgical History:  Procedure Laterality Date   BACK SURGERY  1970   L5-L6 ruptured disk   BLADDER SURGERY  2000   CARDIOVERSION N/A 07/10/2015   CARDIOVERSION N/A 08/08/2019   CARDIOVERSION N/A 10/22/2019   COLON SURGERY     COLONOSCOPY N/A 01/15/2020   Procedure: COLONOSCOPY;  Surgeon: Pasty Spillersahiliani, Varnita B, MD;  Location: [facility] ENDOSCOPY;  Service: Endoscopy;  Laterality: N/A;   COLONOSCOPY N/A 01/16/2020   Procedure: COLONOSCOPY;  Surgeon: Pasty Spillersahiliani, Varnita B, MD;  Location: [facility] ENDOSCOPY;  Service: Endoscopy;  Laterality: N/A;   CORONARY ARTERY BYPASS GRAFT N/A 01/02/1990   Procedure: 2v CORONARY ARTERY BYPASS GRAFT (LIMA-LAD, SVG-RCA)   ESOPHAGOGASTRODUODENOSCOPY N/A 01/13/2020   Procedure: ESOPHAGOGASTRODUODENOSCOPY (EGD);  Surgeon: Pasty Spillersahiliani, Varnita B, MD;  [facility];  Service: Endoscopy;  Laterality: N/A;   REPLACEMENT TOTAL KNEE BILATERAL Bilateral 2008   STENT PLACEMENT VASCULAR ([facility] HX)  2008   TOTAL KNEE REVISION Left 01/16/2020   Procedure: TOTAL KNEE REVISION;  Surgeon: Lyndle HerrlichBowers, James R, MD;  Location: [facility] ORS;  Service: Orthopedics;  Laterality: Left;   TOTAL KNEE REVISION Left 05/31/2021   Procedure: TOTAL KNEE REVISION;  Surgeon: Lyndle HerrlichBowers, James R, MD;  Location: [facility] ORS;  Service: Orthopedics;  Laterality: Left;   FAMILY HISTORY History reviewed. No pertinent family history.  SOCIAL HISTORY Social History   Tobacco Use   Smoking status: Former    Types: Cigarettes    Quit date: 1991    Years since quitting: 32.4   Smokeless tobacco: Never   Vaping Use   Vaping Use: Never used  Substance Use Topics   Alcohol use: Yes    Alcohol/week: 1.0 standard drink of alcohol    Types: 1 Cans of beer per week    Comment: very rarely       OPHTHALMIC EXAM:  Base Eye Exam     Visual Acuity (Snellen - Linear)       Right Left   Dist cc 20/30 -2 LP   Dist ph cc NI     Correction: Glasses         Tonometry (Tonopen, 9:37 AM)       Right Left   Pressure 13 def         Pupils       Dark Light Shape React APD   Right 3 2 Round  Minimal None   Left unable             Visual Fields       Left Right     Full   Restrictions Total superior temporal, inferior temporal, superior nasal, inferior nasal deficiencies          Extraocular Movement       Right Left    Full LXT    -- -- --  --  --  -- -- --   -- -- --  --  --  -- -- --           Neuro/Psych     Oriented x3: Yes   Mood/Affect: Normal         Dilation     Both eyes: 2.5% Phenylephrine, 1.0% Mydriacyl @ 9:36 AM           Slit Lamp and Fundus Exam     External Exam       Right Left   External ecchymosis Normal         Slit Lamp Exam       Right Left   Lids/Lashes Dermatochalasis - upper lid Dermatochalasis - upper lid, Meibomian gland dysfunction   Conjunctiva/Sclera White and quiet White and quiet   Cornea trace PEE, mild arcus trace PEE, large fibrotic scar inferiorly   Anterior Chamber deep, clear, narrow temporal angle abnormal   Iris Round and dilated, No NVI Irregular pupil with traumatic iridectomy inferiorly   Lens 2+ Nuclear sclerosis with brunescence, 2+ Cortical cataract Traumatic cataract   Anterior Vitreous Vitreous syneresis, Posterior vitreous detachment limited view - clear         Fundus Exam       Right Left   Disc Pink and Sharp, Compact, peripapillary CNV with surrounding SRF Pink and Sharp, Compact, temporal PPA   C/D Ratio 0.2 0.3   Macula Flat, Good foveal reflex, Drusen, RPE mottling and  clumping, peripapillary CNV with surrounding SRF -- persistent, focal pigment clumping around CNV, punctate IRH Flat, Blunted foveal reflex   Vessels attenuated, mild tortuosity attenuated   Periphery Attached Limited view, grossly attached, no heme           Refraction     Wearing Rx       Sphere Cylinder Axis Add   Right -1.00 +1.50 001 +2.00   Left Balance               IMAGING AND PROCEDURES  Imaging and Procedures for 11/01/2021  OCT, Retina - OU - Both Eyes       Right Eye Quality was good. Central Foveal Thickness: 212. Progression has been stable. Findings include normal foveal contour, no IRF, subretinal hyper-reflective material, pigment epithelial detachment, subretinal fluid (Peripapillary CNV with surrounding SRF -- no significant change from prior ).   Left Eye Quality was poor. Findings include no IRF, no SRF (Only single line scans captured, no IRF/SRF on obtained images).   Notes *Images captured and stored on drive  Diagnosis / Impression:  OD: exu ARMD - Peripapillary CNV with surrounding SRF -- no significant change  Clinical management:  See below  Abbreviations: NFP - Normal foveal profile. CME - cystoid macular edema. PED - pigment epithelial detachment. IRF - intraretinal fluid. SRF - subretinal fluid. EZ - ellipsoid zone. ERM - epiretinal membrane. ORA - outer retinal atrophy. ORT - outer retinal tubulation. SRHM - subretinal hyper-reflective material. IRHM - intraretinal hyper-reflective material      Intravitreal  Injection, Pharmacologic Agent - OD - Right Eye       Time Out 11/01/2021. 10:55 AM. Confirmed correct patient, procedure, site, and patient consented.   Anesthesia Topical anesthesia was used. Anesthetic medications included Lidocaine 2%, Proparacaine 0.5%.   Procedure Preparation included 5% betadine to ocular surface, eyelid speculum. A (32g) needle was used.   Injection: 1.25 mg Bevacizumab 1.25mg /0.39ml   Route:  Intravitreal, Site: Right Eye   NDC: [id number], Lot: [id number], Expiration date: 12/18/2021   Post-op Post injection exam found visual acuity of at least counting fingers. The patient tolerated the procedure well. There were no complications. The patient received written and verbal post procedure care education. Post injection medications were not given.            ASSESSMENT/PLAN:    ICD-10-CM   1. Exudative age-related macular degeneration of right eye with active choroidal neovascularization (HCC)  H35.3211 OCT, Retina - OU - Both Eyes    Intravitreal Injection, Pharmacologic Agent - OD - Right Eye    Bevacizumab (AVASTIN) SOLN 1.25 mg    2. Essential hypertension  I10     3. Hypertensive retinopathy of both eyes  H35.033     4. Diabetes mellitus type 2 without retinopathy (HCC)  E11.9     5. Low vision of left eye  H54.52A1     6. 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 Exotropia of left eye  H50.112     8. Combined forms of age-related cataract of right eye  H25.811      Exudative age related macular degeneration OD             - s/p IVA OD #1 (05.15.23) - exam and OCT show peripapillary CNV with surrounding SRF OD -- minimal change from prior             - BCVA 20/30 OD (improved w/ new glasses)             - recommend IVA OD #2 today, 06.12.23             - pt wishes to be treated with IVA             - RBA of procedure discussed, questions answered - informed consent obtained and signed, 05.15.23 (OD) - see procedure note             - f/u in 4 wks, DFE, OCT, possible injection   2,3. Hypertensive retinopathy OU - discussed importance of tight BP control - monitor   4. Diabetes mellitus, type 2 without retinopathy - The incidence, risk factors for progression, natural history and treatment options for diabetic retinopathy  were discussed with patient.   - The need for close monitoring of blood glucose, blood pressure, and serum  lipids, avoiding cigarette or any type of tobacco, and the need for long term follow up was also discussed with patient.             - monitor   5-7. 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 Mixed Cataract OD - The symptoms of cataract, surgical options, and treatments and risks were discussed with patient. - discussed diagnosis and progression - likely visually significant - monitor  Ophthalmic Meds Ordered this visit:  Meds ordered this encounter  Medications   Bevacizumab (AVASTIN) SOLN 1.25 mg     Return for DFE, OCT, possible injection.  There are no Patient Instructions on file for this visit.  Explained the diagnoses, plan, and follow up with the patient and they expressed understanding.  Patient expressed understanding of the importance of proper follow up care.   This document serves as a record of services personally performed by Karie Chimera, MD, PhD. It was created on their behalf by Annalee Genta, COMT. The creation of this record is the provider's dictation and/or activities during the visit.  Electronically signed by: Annalee Genta, COMT 11/01/21 12:45 PM  Karie Chimera, M.D., Ph.D. Diseases & Surgery of the Retina and Vitreous [facility]  I have reviewed the above documentation for accuracy and completeness, and I agree with the above. Karie Chimera, M.D., Ph.D. 11/01/21 12:45 PM  Abbreviations: M myopia (nearsighted); A astigmatism; H hyperopia (farsighted); P presbyopia; Mrx spectacle prescription;  CTL contact lenses; OD right eye; OS left eye; OU both eyes  XT exotropia; ET esotropia; PEK  punctate epithelial keratitis; PEE punctate epithelial erosions; DES dry eye syndrome; MGD meibomian gland dysfunction; ATs artificial tears; PFAT's preservative free artificial tears; NSC nuclear sclerotic cataract; PSC posterior subcapsular cataract; ERM epi-retinal membrane; PVD posterior vitreous detachment; RD retinal detachment; DM diabetes mellitus; DR diabetic retinopathy; NPDR non-proliferative diabetic retinopathy; PDR proliferative diabetic retinopathy; CSME clinically significant macular edema; DME diabetic macular edema; dbh dot blot hemorrhages; CWS cotton wool spot; POAG primary open angle glaucoma; C/D cup-to-disc ratio; HVF humphrey visual field; GVF goldmann visual field; OCT optical coherence tomography; IOP intraocular pressure; BRVO Branch retinal vein occlusion; CRVO central retinal vein occlusion; CRAO central retinal artery occlusion; BRAO branch retinal artery occlusion; RT retinal tear; SB scleral buckle; PPV pars plana vitrectomy; VH Vitreous hemorrhage; PRP panretinal laser photocoagulation; IVK intravitreal kenalog; VMT vitreomacular traction; MH Macular hole;  NVD neovascularization of the disc; NVE neovascularization elsewhere; AREDS age related eye disease study; ARMD age related macular degeneration; POAG primary open angle glaucoma; EBMD epithelial/anterior basement membrane dystrophy; ACIOL anterior chamber intraocular lens; IOL intraocular lens; PCIOL posterior chamber intraocular lens; Phaco/IOL phacoemulsification with intraocular lens placement; PRK photorefractive keratectomy; LASIK laser assisted in situ keratomileusis; HTN hypertension; DM diabetes mellitus; COPD chronic obstructive pulmonary disease

## 2021-11-03 ENCOUNTER — Encounter (HOSPITAL_BASED_OUTPATIENT_CLINIC_OR_DEPARTMENT_OTHER): Payer: Medicare Other | Admitting: Internal Medicine

## 2021-11-03 DIAGNOSIS — E11622 Type 2 diabetes mellitus with other skin ulcer: Secondary | ICD-10-CM | POA: Diagnosis not present

## 2021-11-03 DIAGNOSIS — S40811A Abrasion of right upper arm, initial encounter: Secondary | ICD-10-CM

## 2021-11-03 DIAGNOSIS — L98492 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d: Secondary | ICD-10-CM

## 2021-11-03 NOTE — Progress Notes (Signed)
Randall Vincent, Randall Vincent ([id number]) Visit Report for 11/03/2021 Chief Complaint Document Details Patient Name: Randall Vincent, Randall Vincent Date of Service: 11/03/2021 8:00 AM Medical Record Number: [id number] Patient Account Number: [id number] Date of Birth/Sex: 12/26/1940 (81 y.o. M) Treating RN: Yevonne PaxEpps, Carrie Primary Care Provider: Patrina Leveringerrell, Grace Other Clinician: Referring Provider: Patrina Leveringerrell, Grace Treating Provider/Extender: Tilda FrancoHoffman, Brandis Wixted Weeks in Treatment: 8 Information Obtained from: Patient Chief Complaint 09/08/2021; wound to the right upper extremity due to trauma Electronic Signature(s) Signed: 11/03/2021 9:15:38 AM By: Geralyn CorwinHoffman, Yelitza Reach DO Entered By: Geralyn CorwinHoffman, Cherilyn Sautter on 11/03/2021 09:13:05 Randall Vincent, Randall Vincent ([id number]) -------------------------------------------------------------------------------- HPI Details Patient Name: Randall Vincent, Randall Vincent Date of Service: 11/03/2021 8:00 AM Medical Record Number: [id number] Patient Account Number: [id number] Date of Birth/Sex: 11/23/1940 (81 y.o. M) Treating RN: Yevonne PaxEpps, Carrie Primary Care Provider: Patrina Leveringerrell, Grace Other Clinician: Referring Provider: Patrina Leveringerrell, Grace Treating Provider/Extender: Tilda FrancoHoffman, Terrilee Dudzik Weeks in Treatment: 8 History of Present Illness HPI Description: Admission 09/08/2021 Randall Vincent is a 81 year old male with a past medical history of congestive heart failure, CKD stage III, type 2 diabetes currently controlled on oral medications and coronary artery disease status post CABG x2 that presents the clinic for a wound on his right arm for the past two weeks. He states he was walking his dog and Got tripped over the leash and fell creating an abrasion to his right arm. He states he visited urgent care and they helped stop the bleeding. He has not been using any wound care dressings but has been keeping the area covered with a Band-Aid. He currently denies signs of infection. 4/26; patient presents for follow-up. He has been using Xeroform dressings  without issues. He reports improvement in wound healing. He has no issues or complaints today. 5/3; patient presents for follow-up. He has been using Xeroform without issues. Unfortunately he fell yesterday and developed a skin tear to his right upper extremity. He visited the ED for this issue. They placed 50 sutures. He was started on Keflex. 5/10; patient presents for follow-up. He has Xeroform on the wound bed today. We discussed using antibiotic ointment however I am not sure if he has been doing this. He has no issues or complaints today. He denies signs of infection. 5/17; patient presents for follow-up. He has been using mupirocin to the wound bed. He is having trouble doing dressing changes with Xeroform. He denies signs of infection. 5/24; patient presents for follow-up. He has been using mupirocin to the wound bed without issues. This is an easy dressing for him to do on his own. He is pleased with his wound healing so far. He denies signs of infection. 5/31; patient presents for follow-up. He continues to put mupirocin on the wound bed. He has no issues or concerns today. He currently denies signs of infection. 10-27-2021 upon evaluation today patient's wound is almost completely healed. He has been using mupirocin and a Band-Aid and overall honestly I think he is doing quite well. He is very close to complete resolution which is great news. 6/14; patient has been using mupirocin daily. He reports improvement in wound healing. He has no issues or complaints today. Electronic Signature(s) Signed: 11/03/2021 9:15:38 AM By: Geralyn CorwinHoffman, Nahima Ales DO Entered By: Geralyn CorwinHoffman, Hasel Janish on 11/03/2021 09:13:35 Randall Vincent, Randall Vincent ([id number]) -------------------------------------------------------------------------------- Physical Exam Details Patient Name: Randall Vincent, Randall Vincent Date of Service: 11/03/2021 8:00 AM Medical Record Number: [id number] Patient Account Number: [id number] Date of Birth/Sex: 07/17/1940 (81  y.o. M) Treating RN: Yevonne PaxEpps, Carrie Primary Care Provider: Patrina Leveringerrell, Grace Other Clinician: Referring Provider: Patrina Leveringerrell, Grace Treating Provider/Extender: Tilda FrancoHoffman, [place] Canty Weeks  in Treatment: 8 Constitutional . Psychiatric . Notes Right arm: Epithelization to the previous wound site. Electronic Signature(s) Signed: 11/03/2021 9:15:38 AM By: Geralyn Corwin DO Entered By: Geralyn Corwin on 11/03/2021 09:14:04 Randall Vincent ([id number]) -------------------------------------------------------------------------------- Physician Orders Details Patient Name: Randall Vincent Date of Service: 11/03/2021 8:00 AM Medical Record Number: [id number] Patient Account Number: [id number] Date of Birth/Sex: 01-10-1941 (81 y.o. M) Treating RN: Yevonne Pax Primary Care Provider: Patrina Levering Other Clinician: Referring Provider: Patrina Levering Treating Provider/Extender: Tilda Franco in Treatment: 8 Verbal / Phone Orders: No Diagnosis Coding Discharge From [facility] [place] Services o Discharge from Wound Care Center Treatment Complete - vasoline nightly Electronic Signature(s) Signed: 11/03/2021 9:15:38 AM By: Geralyn Corwin DO Entered By: Geralyn Corwin on 11/03/2021 09:14:58 Randall Vincent ([id number]) -------------------------------------------------------------------------------- Problem List Details Patient Name: Randall Vincent Date of Service: 11/03/2021 8:00 AM Medical Record Number: [id number] Patient Account Number: [id number] Date of Birth/Sex: Jul 01, 1940 (81 y.o. M) Treating RN: Yevonne Pax Primary Care Provider: Patrina Levering Other Clinician: Referring Provider: Patrina Levering Treating Provider/Extender: Tilda Franco in Treatment: 8 Active Problems ICD-10 Encounter Code Description Active Date MDM Diagnosis [phone] 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d 09/08/2021 No Yes S41.101A Unspecified open wound of right upper arm, initial  encounter 09/08/2021 No Yes S40.811A Abrasion of right upper arm, initial encounter 09/08/2021 No Yes I25.10 Atherosclerotic heart disease of native coronary artery without angina 09/08/2021 No Yes pectoris E11.622 Type 2 diabetes mellitus with other skin ulcer 09/08/2021 No Yes N18.31 Chronic kidney disease, stage 3a 09/08/2021 No Yes I50.9 Heart failure, unspecified 09/08/2021 No Yes Inactive Problems Resolved Problems Electronic Signature(s) Signed: 11/03/2021 9:15:38 AM By: Geralyn Corwin DO Entered By: Geralyn Corwin on 11/03/2021 09:12:58 Randall Vincent ([id number]) -------------------------------------------------------------------------------- Progress Note Details Patient Name: Randall Vincent Date of Service: 11/03/2021 8:00 AM Medical Record Number: [id number] Patient Account Number: [id number] Date of Birth/Sex: 24-Jul-1940 (81 y.o. M) Treating RN: Yevonne Pax Primary Care Provider: Patrina Levering Other Clinician: Referring Provider: Patrina Levering Treating Provider/Extender: Tilda Franco in Treatment: 8 Subjective Chief Complaint Information obtained from Patient 09/08/2021; wound to the right upper extremity due to trauma History of Present Illness (HPI) Admission 09/08/2021 Mr. Ramsey Guadamuz is a 81 year old male with a past medical history of congestive heart failure, CKD stage III, type 2 diabetes currently controlled on oral medications and coronary artery disease status post CABG x2 that presents the clinic for a wound on his right arm for the past two weeks. He states he was walking his dog and Got tripped over the leash and fell creating an abrasion to his right arm. He states he visited urgent care and they helped stop the bleeding. He has not been using any wound care dressings but has been keeping the area covered with a Band-Aid. He currently denies signs of infection. 4/26; patient presents for follow-up. He has been using Xeroform dressings without issues.  He reports improvement in wound healing. He has no issues or complaints today. 5/3; patient presents for follow-up. He has been using Xeroform without issues. Unfortunately he fell yesterday and developed a skin tear to his right upper extremity. He visited the ED for this issue. They placed 50 sutures. He was started on Keflex. 5/10; patient presents for follow-up. He has Xeroform on the wound bed today. We discussed using antibiotic ointment however I am not sure if he has been doing this. He has no issues or complaints today. He denies signs of infection. 5/17; patient presents for follow-up.  He has been using mupirocin to the wound bed. He is having trouble doing dressing changes with Xeroform. He denies signs of infection. 5/24; patient presents for follow-up. He has been using mupirocin to the wound bed without issues. This is an easy dressing for him to do on his own. He is pleased with his wound healing so far. He denies signs of infection. 5/31; patient presents for follow-up. He continues to put mupirocin on the wound bed. He has no issues or concerns today. He currently denies signs of infection. 10-27-2021 upon evaluation today patient's wound is almost completely healed. He has been using mupirocin and a Band-Aid and overall honestly I think he is doing quite well. He is very close to complete resolution which is great news. 6/14; patient has been using mupirocin daily. He reports improvement in wound healing. He has no issues or complaints today. Objective Constitutional Vitals Time Taken: 8:07 AM, Height: 68 in, Weight: 210 lbs, BMI: 31.9, Temperature: 97.9 F, Pulse: 79 bpm, Respiratory Rate: 18 breaths/min, Blood Pressure: 154/63 mmHg. General Notes: Right arm: Epithelization to the previous wound site. Integumentary (Hair, Skin) Wound #2 status is Open. Original cause of wound was Skin Tear/Laceration. The date acquired was: 09/21/2021. The wound has been in treatment 6 weeks.  The wound is located on the Right Forearm. The wound measures 0cm length x 0cm width x 0cm depth; 0cm^2 area and 0cm^3 volume. There is no tunneling or undermining noted. There is a none present amount of drainage noted. There is no granulation within the wound bed. There is no necrotic tissue within the wound bed. TERALD, JUMP ([id number]) Assessment Active Problems ICD-10 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d Unspecified open wound of right upper arm, initial encounter Abrasion of right upper arm, initial encounter Atherosclerotic heart disease of native coronary artery without angina pectoris Type 2 diabetes mellitus with other skin ulcer Chronic kidney disease, stage 3a Heart failure, unspecified Patient has done well with mupirocin ointment. His wound is healed. He may use Vaseline nightly. Follow-up as needed. Plan Discharge From [facility] Services: Discharge from Wound Care Center Treatment Complete - vasoline nightly 1. Discharge from clinic due to closed wound 2. Follow-up as needed 3. Vaseline nightly Electronic Signature(s) Signed: 11/03/2021 9:15:38 AM By: Geralyn Corwin DO Entered By: Geralyn Corwin on 11/03/2021 09:14:29 Randall Vincent ([id number]) -------------------------------------------------------------------------------- SuperBill Details Patient Name: Randall Vincent Date of Service: 11/03/2021 Medical Record Number: [id number] Patient Account Number: [id number] Date of Birth/Sex: 07/23/1940 (81 y.o. M) Treating RN: Yevonne Pax Primary Care Provider: Patrina Levering Other Clinician: Referring Provider: Patrina Levering Treating Provider/Extender: Tilda Franco in Treatment: 8 Diagnosis Coding ICD-10 Codes Code Description [phone] 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d S41.101A Unspecified open wound of right upper arm, initial encounter S40.811A Abrasion of right upper arm, initial  encounter I25.10 Atherosclerotic heart disease of native coronary artery without angina pectoris E11.622 Type 2 diabetes mellitus with other skin ulcer N18.31 Chronic kidney disease, stage 3a I50.9 Heart failure, unspecified Facility Procedures CPT4 Code: [id number] Description: [id number] - WOUND CARE VISIT-LEV 2 EST PT Modifier: Quantity: 1 Physician Procedures CPT4 Code: [id number] Description: 99213 - WC PHYS LEVEL 3 - EST PT Modifier: Quantity: 1 CPT4 Code: Description: ICD-10 Diagnosis Description L98.492 Non-pressure chronic ulcer of skin of other sites with fat layer expos S40.811A Abrasion of right upper arm, initial encounter Modifier: ed Quantity: Electronic Signature(s) Signed: 11/03/2021 9:15:38 AM By: Geralyn Corwin DO Entered By:  Geralyn Corwin on 11/03/2021 09:14:51

## 2021-11-10 ENCOUNTER — Ambulatory Visit: Payer: Medicare Other | Admitting: Internal Medicine

## 2021-11-24 NOTE — Progress Notes (Signed)
[facility] - Clinic Note  11/29/2021     CHIEF COMPLAINT Patient presents for Retina Follow Up   HISTORY OF PRESENT ILLNESS: Randall Vincent is a 81 y.o. male who presents to the clinic today for:   HPI     Retina Follow Up   Patient presents with  Wet AMD.  In right eye.  Duration of 4 weeks.  Since onset it is stable.  I, the attending physician,  performed the HPI with the patient and updated documentation appropriately.        Comments   4 week follow up Exu ARMD OD-  Patient is unable to tell any changes in vision.  BS 146 this morning A1C 6.0      Last edited by Rennis Chris, MD on 11/29/2021  8:27 AM.      Patient reports burning after last injection, he had an appt with his cardiologist last week and got a good report, he states his BP is usually very good  Referring physician: [name] [address] [place], [place] [address]   HISTORICAL INFORMATION:   Selected notes from the MEDICAL RECORD NUMBER Referred by Dr. [name] for peripapillary CNV LEE: 09/21/21 BCVA OD 20/50 OS 20/40 Ocular Hx- peripapillary CNV, cataracts, DM without retinopathy, HTN retinopathy, exotropia OS PMH- DM, HTN; last a1c was 6.0 on 05.06.23    CURRENT MEDICATIONS: No current outpatient medications on file. (Ophthalmic Drugs)   No current facility-administered medications for this visit. (Ophthalmic Drugs)   Current Outpatient Medications (Other)  Medication Sig   amiodarone (PACERONE) 200 MG tablet Take 200 mg by mouth daily.   apixaban (ELIQUIS) 5 MG TABS tablet Take 1 tablet (5 mg total) by mouth 2 (two) times daily.   atorvastatin (LIPITOR) 40 MG tablet Take 40 mg by mouth every evening.    carvedilol (COREG) 3.125 MG tablet Take 1 tablet (3.125 mg total) by mouth 2 (two) times daily with a meal.   colchicine 0.6 MG tablet Take 0.6 mg by mouth 2 (two) times daily.   diltiazem (CARDIZEM CD) 240 MG 24 hr capsule Take 240 mg by mouth daily.   docusate  sodium (COLACE) 100 MG capsule Take 1 capsule (100 mg total) by mouth 2 (two) times daily.   ferrous sulfate 325 (65 FE) MG tablet Take 325 mg by mouth daily with breakfast.   HYDROcodone-acetaminophen (NORCO/VICODIN) 5-325 MG tablet Take 1 tablet by mouth every 4 (four) hours as needed for severe pain.   metFORMIN (GLUCOPHAGE-XR) 500 MG 24 hr tablet Take 500 mg by mouth 2 (two) times daily.   ramipril (ALTACE) 10 MG capsule Take 1 capsule (10 mg total) by mouth daily.   Semaglutide, 1 MG/DOSE, 2 MG/1.5ML SOPN Inject 1 mg into the skin every Thursday.   torsemide (DEMADEX) 20 MG tablet Take 1 tablet (20 mg total) by mouth 2 (two) times daily. (Patient taking differently: Take 10-20 mg by mouth See admin instructions. Take 20 mg by mouth in the morning and 10 mg in the evening)   traMADol (ULTRAM) 50 MG tablet Take 1 tablet (50 mg total) by mouth every 8 (eight) hours as needed for moderate pain or severe pain. (Patient taking differently: Take 50 mg by mouth 4 (four) times daily as needed for moderate pain or severe pain.)   vitamin B-12 (CYANOCOBALAMIN) 1000 MCG tablet Take 1,000 mcg by mouth daily.   apixaban (ELIQUIS) 5 MG TABS tablet Take by mouth.   No current facility-administered  medications for this visit. (Other)   REVIEW OF SYSTEMS: ROS   Positive for: Genitourinary, Endocrine, Cardiovascular, Eyes, Respiratory Negative for: Constitutional, Gastrointestinal, Neurological, Skin, Musculoskeletal, HENT, Psychiatric, Allergic/Imm, Heme/Lymph Last edited by [name], COA on 11/29/2021  7:52 AM.      ALLERGIES No Known Allergies  PAST MEDICAL HISTORY Past Medical History:  Diagnosis Date   A-fib [facility])    a.) CHA2DS2-VASc = 6 (age x 2, CHF, HTN, previous MI, T2DM). b.) Rate/rhythm maintained on amiodarone + diltiazem + carvedilol; chronically anticoagulated using dabigatran. c.) DCCV 07/10/2015, 08/08/2019, 10/22/2019.   Arthritis    Chest pain 06/26/2019   CHF (congestive heart  failure) (HCC)    CKD (chronic kidney disease), stage III (HCC)    Coronary artery disease 1991   a.) 2v CABG in 1991. b.) PCI 06/07/2006 --> Vision stents to native LCx and RCA   Hyperlipidemia    Hypertension    Long term current use of anticoagulant    a.) dabigatran   Myocardial infarction (HCC) 1991   a.) LHC --> 50% and 90% LAD lesions; referred to CVTS. 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   a.) LIMA-LAD, SVG-RCA   T2DM (type 2 diabetes mellitus) (HCC)    Past Surgical History:  Procedure Laterality Date   BACK SURGERY  1970   L5-L6 ruptured disk   BLADDER SURGERY  2000   CARDIOVERSION N/A 07/10/2015   CARDIOVERSION N/A 08/08/2019   CARDIOVERSION N/A 10/22/2019   COLON SURGERY     COLONOSCOPY N/A 01/15/2020   Procedure: COLONOSCOPY;  Surgeon: Pasty Spillers, MD;  Location: [facility] ENDOSCOPY;  Service: Endoscopy;  Laterality: N/A;   COLONOSCOPY N/A 01/16/2020   Procedure: COLONOSCOPY;  Surgeon: Pasty Spillers, MD;  Location: [facility] ENDOSCOPY;  Service: Endoscopy;  Laterality: N/A;   CORONARY ARTERY BYPASS GRAFT N/A 01/02/1990   Procedure: 2v CORONARY ARTERY BYPASS GRAFT (LIMA-LAD, SVG-RCA)   ESOPHAGOGASTRODUODENOSCOPY N/A 01/13/2020   Procedure: ESOPHAGOGASTRODUODENOSCOPY (EGD);  Surgeon: Pasty Spillers, MD;  Location[facility] ENDOSCOPY;  Service: Endoscopy;  Laterality: N/A;   REPLACEMENT TOTAL KNEE BILATERAL Bilateral 2008   STENT PLACEMENT VASCULAR ([facility] HX)  2008   TOTAL KNEE REVISION Left 01/16/2020   Procedure: TOTAL KNEE REVISION;  Surgeon: Lyndle Herrlich, MD;  Location: [facility] ORS;  Service: Orthopedics;  Laterality: Left;   TOTAL KNEE REVISION Left 05/31/2021   Procedure: TOTAL KNEE REVISION;  Surgeon: Lyndle Herrlich, MD;  Location: [facility] ORS;  Service: Orthopedics;  Laterality: Left;   FAMILY HISTORY History reviewed. No pertinent family history.  SOCIAL HISTORY Social History   Tobacco Use    Smoking status: Former    Types: Cigarettes    Quit date: 1991    Years since quitting: 32.5   Smokeless tobacco: Never  Vaping Use   Vaping Use: Never used  Substance Use Topics   Alcohol use: Yes    Alcohol/week: 1.0 standard drink of alcohol    Types: 1 Cans of beer per week    Comment: very rarely       OPHTHALMIC EXAM:  Base Eye Exam     Visual Acuity (Snellen - Linear)       Right Left   Dist cc 20/70 +1 HM temporally   Dist ph cc 20/40     Correction: Glasses         Tonometry (Tonopen, 7:58 AM)       Right Left  Pressure 17 22         Pupils       Dark Light Shape React APD   Right 3 2 Round Brisk None   Left   Irregular NR          Visual Fields (Counting fingers)       Left Right    Full Full         Extraocular Movement       Right Left    Full Full         Neuro/Psych     Oriented x3: Yes   Mood/Affect: Normal         Dilation     Both eyes: 1.0% Mydriacyl, 2.5% Phenylephrine @ 7:58 AM           Slit Lamp and Fundus Exam     External Exam       Right Left   External ecchymosis Normal         Slit Lamp Exam       Right Left   Lids/Lashes Dermatochalasis - upper lid Dermatochalasis - upper lid, Meibomian gland dysfunction   Conjunctiva/Sclera White and quiet White and quiet   Cornea trace PEE, mild arcus trace PEE, large fibrotic scar inferiorly   Anterior Chamber deep, clear, narrow temporal angle abnormal   Iris Round and dilated, No NVI Irregular pupil with traumatic iridectomy inferiorly   Lens 2+ Nuclear sclerosis with brunescence, 2+ Cortical cataract Traumatic cataract   Anterior Vitreous Vitreous syneresis, Posterior vitreous detachment limited view - clear         Fundus Exam       Right Left   Disc Pink and Sharp, Compact, peripapillary CNV with surrounding SRF Pink and Sharp, Compact, temporal PPA   C/D Ratio 0.2 0.3   Macula Flat, Good foveal reflex, Drusen, RPE mottling and clumping,  peripapillary CNV with surrounding SRF -- persistent, focal pigment clumping around CNV, punctate IRH, new CWS along IT arcades Flat, Blunted foveal reflex   Vessels attenuated, Tortuous attenuated   Periphery Attached Limited view, grossly attached, no heme           Refraction     Wearing Rx       Sphere Cylinder Axis Add   Right -1.00 +1.50 001 +2.00   Left Balance            Manifest Refraction       Sphere Cylinder Axis Dist VA   Right -1.75 +1.75 180 20/60+   Left                IMAGING AND PROCEDURES  Imaging and Procedures for 11/29/2021  OCT, Retina - OU - Both Eyes       Right Eye Quality was good. Central Foveal Thickness: 214. Progression has worsened. Findings include normal foveal contour, no IRF, subretinal hyper-reflective material, pigment epithelial detachment, subretinal fluid (Persistent peripapillary CNV with surrounding SRF -- interval shifting of fluid inferiorly).   Left Eye Findings include (No scan obtained).   Notes *Images captured and stored on drive  Diagnosis / Impression:  OD: exu ARMD - Persistent peripapillary CNV with surrounding SRF -- interval shifting of fluid inferiorly  Clinical management:  See below  Abbreviations: NFP - Normal foveal profile. CME - cystoid macular edema. PED - pigment epithelial detachment. IRF - intraretinal fluid. SRF - subretinal fluid. EZ - ellipsoid zone. ERM - epiretinal membrane. ORA - outer retinal atrophy. ORT - outer retinal tubulation. SRHM -  subretinal hyper-reflective material. IRHM - intraretinal hyper-reflective material      Intravitreal Injection, Pharmacologic Agent - OD - Right Eye       Time Out 11/29/2021. 8:26 AM. Confirmed correct patient, procedure, site, and patient consented.   Anesthesia Topical anesthesia was used. Anesthetic medications included Lidocaine 2%, Proparacaine 0.5%.   Procedure Preparation included 5% betadine to ocular surface, eyelid speculum. A  (32g) needle was used.   Injection: 1.25 mg Bevacizumab 1.25mg /0.36ml   Route: Intravitreal, Site: Right Eye   NDC: [id number], Lot: [id number], Expiration date: 02/08/2022   Post-op Post injection exam found visual acuity of at least counting fingers. The patient tolerated the procedure well. There were no complications. The patient received written and verbal post procedure care education. Post injection medications were not given.            ASSESSMENT/PLAN:    ICD-10-CM   1. Exudative age-related macular degeneration of right eye with active choroidal neovascularization (HCC)  H35.3211 OCT, Retina - OU - Both Eyes    Intravitreal Injection, Pharmacologic Agent - OD - Right Eye    Bevacizumab (AVASTIN) SOLN 1.25 mg    2. Essential hypertension  I10     3. Hypertensive retinopathy of both eyes  H35.033     4. Diabetes mellitus type 2 without retinopathy (HCC)  E11.9     5. Low vision of left eye  H54.52A1     6. 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 Exotropia of left eye  H50.112     8. Combined forms of age-related cataract of right eye  H25.811       Exudative age related macular degeneration OD             - s/p IVA OD #1 (05.15.23), #2 (06.12.23) - exam and OCT shows Persistent peripapillary CNV with surrounding SRF -- interval shifting of fluid inferiorly - BCVA decreased to 20/40 from 20/30 OD - discussed possible IVA resistance, will consider switch to Eylea at next visit             - recommend IVA OD #3 today, 06.12.23             - pt wishes to be treated with IVA             - RBA of procedure discussed, questions answered - informed consent obtained and signed, 05.15.23 (OD) - see procedure note             - f/u in 4 wks, DFE, OCT, possible injection   2,3. Hypertensive retinopathy OU - discussed importance of tight BP control - monitor   4. Diabetes mellitus, type 2 without retinopathy - The incidence, risk factors for  progression, natural history and treatment options for diabetic retinopathy  were discussed with patient.   - The need for close monitoring of blood glucose, blood pressure, and serum lipids, avoiding cigarette or any type of tobacco, and the need for long term follow up was also discussed with patient.             - monitor   5-7. 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 Mixed Cataract OD - The symptoms of cataract, surgical options, and treatments and risks were discussed with patient. - discussed diagnosis and progression - likely visually significant - monitor  Ophthalmic Meds Ordered this visit:  Meds ordered this encounter  Medications   Bevacizumab (AVASTIN) SOLN 1.25 mg     Return in about 4 weeks (around 12/27/2021) for ex ARMD OD, Dilated Exam, OCT, Possible Injxn.  There are no Patient Instructions on file for this visit.  Explained the diagnoses, plan, and follow up with the patient and they expressed understanding.  Patient expressed understanding of the importance of proper follow up care.   This document serves as a record of services personally performed by Karie ChimeraBrian G. Marshal Schrecengost, MD, PhD. It was created on their behalf by Annalee Gentaaryl Barber, COMT. The creation of this record is the provider's dictation and/or activities during the visit.  Electronically signed by: Annalee Gentaaryl Barber, COMT 11/29/21 8:44 AM  This document serves as a record of services personally performed by Karie ChimeraBrian G. Zebulun Deman, MD, PhD. It was created on their behalf by Glee ArvinAmanda J. Manson PasseyBrown, OA an ophthalmic technician. The creation of this record is the provider's dictation and/or activities  during the visit.    Electronically signed by: Glee ArvinAmanda J. Manson PasseyBrown, [place] 07.10.2023 8:44 AM   Karie ChimeraBrian G. Bobie Kistler, M.D., Ph.D. Diseases & Surgery of the Retina and Vitreous [facility]  I have reviewed the above documentation for accuracy and completeness, and I agree with the above. Karie ChimeraBrian G. Deke Tilghman, M.D., Ph.D. 11/29/21 8:44 AM   Abbreviations: M myopia (nearsighted); A astigmatism; H hyperopia (farsighted); P presbyopia; Mrx spectacle prescription;  CTL contact lenses; OD right eye; OS left eye; OU both eyes  XT exotropia; ET esotropia; PEK punctate epithelial keratitis; PEE punctate epithelial erosions; DES dry eye syndrome; MGD meibomian gland dysfunction; ATs artificial tears; PFAT's preservative free artificial tears; NSC nuclear sclerotic cataract; PSC posterior subcapsular cataract; ERM epi-retinal membrane; PVD posterior vitreous detachment; RD retinal detachment; DM diabetes mellitus; DR diabetic retinopathy; NPDR non-proliferative diabetic retinopathy; PDR proliferative diabetic retinopathy; CSME clinically significant macular edema; DME diabetic macular edema; dbh dot blot hemorrhages; CWS cotton wool spot; POAG primary open angle glaucoma; C/D cup-to-disc ratio; HVF humphrey visual field; GVF goldmann visual field; OCT optical coherence tomography; IOP intraocular pressure; BRVO Branch retinal vein occlusion; CRVO central retinal vein occlusion; CRAO central retinal artery occlusion; BRAO branch retinal artery occlusion; RT retinal tear; SB scleral buckle; PPV pars plana vitrectomy; VH Vitreous hemorrhage; PRP panretinal laser photocoagulation; IVK intravitreal kenalog; VMT vitreomacular traction; MH Macular hole;  NVD neovascularization of the disc; NVE neovascularization elsewhere; AREDS age related eye disease study; ARMD age related macular degeneration; POAG primary open angle glaucoma; EBMD epithelial/anterior basement membrane dystrophy; ACIOL anterior chamber  intraocular lens; IOL intraocular lens; PCIOL posterior chamber intraocular lens; Phaco/IOL phacoemulsification with intraocular lens placement; PRK photorefractive keratectomy; LASIK laser assisted in situ keratomileusis; HTN hypertension; DM diabetes mellitus; COPD chronic obstructive pulmonary disease

## 2021-11-29 ENCOUNTER — Encounter (INDEPENDENT_AMBULATORY_CARE_PROVIDER_SITE_OTHER): Payer: Self-pay | Admitting: Ophthalmology

## 2021-11-29 ENCOUNTER — Ambulatory Visit (INDEPENDENT_AMBULATORY_CARE_PROVIDER_SITE_OTHER): Payer: Medicare Other | Admitting: Ophthalmology

## 2021-11-29 DIAGNOSIS — H26122 Partially resolved traumatic cataract, left eye: Secondary | ICD-10-CM

## 2021-11-29 DIAGNOSIS — I10 Essential (primary) hypertension: Secondary | ICD-10-CM | POA: Diagnosis not present

## 2021-11-29 DIAGNOSIS — H35033 Hypertensive retinopathy, bilateral: Secondary | ICD-10-CM | POA: Diagnosis not present

## 2021-11-29 DIAGNOSIS — H5452A1 Low vision left eye category 1, normal vision right eye: Secondary | ICD-10-CM | POA: Diagnosis not present

## 2021-11-29 DIAGNOSIS — E119 Type 2 diabetes mellitus without complications: Secondary | ICD-10-CM

## 2021-11-29 DIAGNOSIS — H353211 Exudative age-related macular degeneration, right eye, with active choroidal neovascularization: Secondary | ICD-10-CM

## 2021-11-29 DIAGNOSIS — H50112 Monocular exotropia, left eye: Secondary | ICD-10-CM

## 2021-11-29 DIAGNOSIS — H25811 Combined forms of age-related cataract, right eye: Secondary | ICD-10-CM

## 2021-11-29 MED ORDER — BEVACIZUMAB CHEMO INJECTION 1.25MG/0.05ML SYRINGE FOR KALEIDOSCOPE
1.2500 mg | INTRAVITREAL | Status: AC | PRN
  Administered 2021-11-29: 1.25 mg via INTRAVITREAL

## 2021-11-30 ENCOUNTER — Other Ambulatory Visit: Payer: Self-pay | Admitting: Orthopedic Surgery

## 2021-11-30 DIAGNOSIS — Z96651 Presence of right artificial knee joint: Secondary | ICD-10-CM

## 2021-11-30 DIAGNOSIS — T8484XD Pain due to internal orthopedic prosthetic devices, implants and grafts, subsequent encounter: Secondary | ICD-10-CM

## 2021-12-15 ENCOUNTER — Other Ambulatory Visit: Payer: Medicare Other

## 2021-12-21 ENCOUNTER — Inpatient Hospital Stay
Admission: EM | Admit: 2021-12-21 | Discharge: 2021-12-24 | DRG: 683 | Disposition: A | Discharge status: Home / Self Care | Payer: Medicare Other | Attending: Internal Medicine | Admitting: Internal Medicine

## 2021-12-21 ENCOUNTER — Other Ambulatory Visit: Payer: Self-pay

## 2021-12-21 ENCOUNTER — Emergency Department: Payer: Medicare Other

## 2021-12-21 DIAGNOSIS — E860 Dehydration: Secondary | ICD-10-CM | POA: Diagnosis present

## 2021-12-21 DIAGNOSIS — Z79899 Other long term (current) drug therapy: Secondary | ICD-10-CM

## 2021-12-21 DIAGNOSIS — D500 Iron deficiency anemia secondary to blood loss (chronic): Principal | ICD-10-CM | POA: Diagnosis present

## 2021-12-21 DIAGNOSIS — Z7901 Long term (current) use of anticoagulants: Secondary | ICD-10-CM

## 2021-12-21 DIAGNOSIS — T45515A Adverse effect of anticoagulants, initial encounter: Secondary | ICD-10-CM | POA: Diagnosis present

## 2021-12-21 DIAGNOSIS — E1122 Type 2 diabetes mellitus with diabetic chronic kidney disease: Secondary | ICD-10-CM | POA: Diagnosis present

## 2021-12-21 DIAGNOSIS — I252 Old myocardial infarction: Secondary | ICD-10-CM

## 2021-12-21 DIAGNOSIS — I4891 Unspecified atrial fibrillation: Secondary | ICD-10-CM | POA: Diagnosis present

## 2021-12-21 DIAGNOSIS — N183 Chronic kidney disease, stage 3 unspecified: Secondary | ICD-10-CM

## 2021-12-21 DIAGNOSIS — E876 Hypokalemia: Secondary | ICD-10-CM | POA: Diagnosis not present

## 2021-12-21 DIAGNOSIS — I2510 Atherosclerotic heart disease of native coronary artery without angina pectoris: Secondary | ICD-10-CM | POA: Diagnosis present

## 2021-12-21 DIAGNOSIS — Z951 Presence of aortocoronary bypass graft: Secondary | ICD-10-CM

## 2021-12-21 DIAGNOSIS — E11311 Type 2 diabetes mellitus with unspecified diabetic retinopathy with macular edema: Secondary | ICD-10-CM | POA: Diagnosis present

## 2021-12-21 DIAGNOSIS — N179 Acute kidney failure, unspecified: Secondary | ICD-10-CM | POA: Diagnosis present

## 2021-12-21 DIAGNOSIS — I5022 Chronic systolic (congestive) heart failure: Secondary | ICD-10-CM | POA: Diagnosis present

## 2021-12-21 DIAGNOSIS — E785 Hyperlipidemia, unspecified: Secondary | ICD-10-CM | POA: Diagnosis present

## 2021-12-21 DIAGNOSIS — I10 Essential (primary) hypertension: Secondary | ICD-10-CM | POA: Diagnosis present

## 2021-12-21 DIAGNOSIS — Z66 Do not resuscitate: Secondary | ICD-10-CM | POA: Diagnosis present

## 2021-12-21 DIAGNOSIS — I130 Hypertensive heart and chronic kidney disease with heart failure and stage 1 through stage 4 chronic kidney disease, or unspecified chronic kidney disease: Secondary | ICD-10-CM | POA: Diagnosis present

## 2021-12-21 DIAGNOSIS — G4733 Obstructive sleep apnea (adult) (pediatric): Secondary | ICD-10-CM | POA: Diagnosis present

## 2021-12-21 DIAGNOSIS — D649 Anemia, unspecified: Principal | ICD-10-CM

## 2021-12-21 DIAGNOSIS — N189 Chronic kidney disease, unspecified: Secondary | ICD-10-CM | POA: Diagnosis present

## 2021-12-21 DIAGNOSIS — H5462 Unqualified visual loss, left eye, normal vision right eye: Secondary | ICD-10-CM | POA: Diagnosis present

## 2021-12-21 DIAGNOSIS — Z7984 Long term (current) use of oral hypoglycemic drugs: Secondary | ICD-10-CM

## 2021-12-21 DIAGNOSIS — N1831 Chronic kidney disease, stage 3a: Secondary | ICD-10-CM | POA: Diagnosis present

## 2021-12-21 DIAGNOSIS — E119 Type 2 diabetes mellitus without complications: Secondary | ICD-10-CM

## 2021-12-21 DIAGNOSIS — Z96652 Presence of left artificial knee joint: Secondary | ICD-10-CM

## 2021-12-21 DIAGNOSIS — Z515 Encounter for palliative care: Secondary | ICD-10-CM

## 2021-12-21 DIAGNOSIS — R238 Other skin changes: Secondary | ICD-10-CM | POA: Diagnosis present

## 2021-12-21 DIAGNOSIS — Z96653 Presence of artificial knee joint, bilateral: Secondary | ICD-10-CM | POA: Diagnosis present

## 2021-12-21 DIAGNOSIS — I482 Chronic atrial fibrillation, unspecified: Secondary | ICD-10-CM | POA: Diagnosis present

## 2021-12-21 DIAGNOSIS — L24A0 Irritant contact dermatitis due to friction or contact with body fluids, unspecified: Secondary | ICD-10-CM | POA: Diagnosis present

## 2021-12-21 DIAGNOSIS — Z7985 Long-term (current) use of injectable non-insulin antidiabetic drugs: Secondary | ICD-10-CM

## 2021-12-21 DIAGNOSIS — Z87891 Personal history of nicotine dependence: Secondary | ICD-10-CM

## 2021-12-21 LAB — COMPREHENSIVE METABOLIC PANEL
ALT: 26 U/L (ref 0–44)
AST: 32 U/L (ref 15–41)
Albumin: 2.9 g/dL — ABNORMAL LOW (ref 3.5–5.0)
Alkaline Phosphatase: 90 U/L (ref 38–126)
Anion gap: 10 (ref 5–15)
BUN: 61 mg/dL — ABNORMAL HIGH (ref 8–23)
CO2: 25 mmol/L (ref 22–32)
Calcium: 8.2 mg/dL — ABNORMAL LOW (ref 8.9–10.3)
Chloride: 103 mmol/L (ref 98–111)
Creatinine, Ser: 2.59 mg/dL — ABNORMAL HIGH (ref 0.61–1.24)
GFR, Estimated: 24 mL/min — ABNORMAL LOW (ref >60)
Glucose, Bld: 192 mg/dL — ABNORMAL HIGH (ref 70–99)
Potassium: 3.7 mmol/L (ref 3.5–5.1)
Sodium: 138 mmol/L (ref 135–145)
Total Bilirubin: 1 mg/dL (ref 0.3–1.2)
Total Protein: 6.8 g/dL (ref 6.5–8.1)

## 2021-12-21 LAB — CBC WITH DIFFERENTIAL/PLATELET
Abs Immature Granulocytes: 0.05 10*3/uL (ref 0.00–0.07)
Basophils Absolute: 0 10*3/uL (ref 0.0–0.1)
Basophils Relative: 0 %
Eosinophils Absolute: 0 10*3/uL (ref 0.0–0.5)
Eosinophils Relative: 0 %
HCT: 24.9 % — ABNORMAL LOW (ref 39.0–52.0)
Hemoglobin: 7 g/dL — ABNORMAL LOW (ref 13.0–17.0)
Immature Granulocytes: 1 %
Lymphocytes Relative: 4 %
Lymphs Abs: 0.4 10*3/uL — ABNORMAL LOW (ref 0.7–4.0)
MCH: 23.1 pg — ABNORMAL LOW (ref 26.0–34.0)
MCHC: 28.1 g/dL — ABNORMAL LOW (ref 30.0–36.0)
MCV: 82.2 fL (ref 80.0–100.0)
Monocytes Absolute: 0.7 10*3/uL (ref 0.1–1.0)
Monocytes Relative: 7 %
Neutro Abs: 8.8 10*3/uL — ABNORMAL HIGH (ref 1.7–7.7)
Neutrophils Relative %: 88 %
Platelets: 230 10*3/uL (ref 150–400)
RBC: 3.03 MIL/uL — ABNORMAL LOW (ref 4.22–5.81)
RDW: 17.5 % — ABNORMAL HIGH (ref 11.5–15.5)
WBC: 9.9 10*3/uL (ref 4.0–10.5)
nRBC: 0 % (ref 0.0–0.2)

## 2021-12-21 LAB — URINALYSIS, ROUTINE W REFLEX MICROSCOPIC
Bilirubin Urine: NEGATIVE
Glucose, UA: NEGATIVE mg/dL
Hgb urine dipstick: NEGATIVE
Ketones, ur: NEGATIVE mg/dL
Leukocytes,Ua: NEGATIVE
Nitrite: NEGATIVE
Protein, ur: NEGATIVE mg/dL
Specific Gravity, Urine: 1.01 (ref 1.005–1.030)
pH: 5 (ref 5.0–8.0)

## 2021-12-21 LAB — PREPARE RBC (CROSSMATCH)

## 2021-12-21 LAB — LACTIC ACID, PLASMA: Lactic Acid, Venous: 2.7 mmol/L (ref 0.5–1.9)

## 2021-12-21 MED ORDER — APIXABAN 5 MG PO TABS
5.0000 mg | ORAL_TABLET | Freq: Two times a day (BID) | ORAL | Status: DC
  Administered 2021-12-22 – 2021-12-24 (×4): 5 mg via ORAL
  Filled 2021-12-21 (×4): qty 1

## 2021-12-21 MED ORDER — ACETAMINOPHEN 650 MG RE SUPP: 650.0000 mg | Freq: Four times a day (QID) | RECTAL | Status: DC | PRN

## 2021-12-21 MED ORDER — SODIUM CHLORIDE 0.9 % IV BOLUS
1000.0000 mL | Freq: Once | INTRAVENOUS | Status: AC
  Administered 2021-12-21: 1000 mL via INTRAVENOUS

## 2021-12-21 MED ORDER — VITAMIN B-12 1000 MCG PO TABS
1000.0000 ug | ORAL_TABLET | Freq: Every day | ORAL | Status: DC
  Administered 2021-12-22 – 2021-12-24 (×3): 1000 ug via ORAL
  Filled 2021-12-21 (×3): qty 1

## 2021-12-21 MED ORDER — INSULIN ASPART 100 UNIT/ML IJ SOLN
0.0000 [IU] | Freq: Three times a day (TID) | INTRAMUSCULAR | Status: DC
  Administered 2021-12-22 – 2021-12-24 (×3): 1 [IU] via SUBCUTANEOUS
  Filled 2021-12-21 (×3): qty 1

## 2021-12-21 MED ORDER — DILTIAZEM HCL ER COATED BEADS 120 MG PO CP24
240.0000 mg | ORAL_CAPSULE | Freq: Every day | ORAL | Status: DC
  Administered 2021-12-22 – 2021-12-24 (×3): 240 mg via ORAL
  Filled 2021-12-21 (×3): qty 2

## 2021-12-21 MED ORDER — SODIUM CHLORIDE 0.9 % IV SOLN: 10.0000 mL/h | Freq: Once | INTRAVENOUS | Status: DC

## 2021-12-21 MED ORDER — CARVEDILOL 6.25 MG PO TABS
3.1250 mg | ORAL_TABLET | Freq: Two times a day (BID) | ORAL | Status: DC
  Administered 2021-12-22 – 2021-12-24 (×5): 3.125 mg via ORAL
  Filled 2021-12-21 (×5): qty 1

## 2021-12-21 MED ORDER — ACETAMINOPHEN 325 MG PO TABS
650.0000 mg | ORAL_TABLET | Freq: Four times a day (QID) | ORAL | Status: DC | PRN
  Administered 2021-12-22 – 2021-12-24 (×5): 650 mg via ORAL
  Filled 2021-12-21 (×6): qty 2

## 2021-12-21 MED ORDER — AMIODARONE HCL 200 MG PO TABS
200.0000 mg | ORAL_TABLET | Freq: Every day | ORAL | Status: DC
  Administered 2021-12-22 – 2021-12-24 (×3): 200 mg via ORAL
  Filled 2021-12-21 (×3): qty 1

## 2021-12-21 MED ORDER — ATORVASTATIN CALCIUM 20 MG PO TABS
40.0000 mg | ORAL_TABLET | Freq: Every evening | ORAL | Status: DC
  Administered 2021-12-22 – 2021-12-23 (×2): 40 mg via ORAL
  Filled 2021-12-21 (×2): qty 2

## 2021-12-21 NOTE — ED Provider Triage Note (Signed)
Emergency Medicine Provider Triage Evaluation Note  Randall Vincent , a 81 y.o. male  was evaluated in triage.  Pt complains of possible dehydration, poor appetite, short of breath and renal failure. Was seen at PCP office yesterday and hgb 9 and now to 7.5.  PCP called and told him to come to ED.   Review of Systems  Positive: Occasional chill,  Negative: No hematuria, no blood in BM's   Physical Exam  BP (!) 103/52 (BP Location: Left Arm)   Pulse 87   Temp 98.3 F (36.8 C) (Oral)   Resp 17   SpO2 98%  Gen:   Awake, no distress.  Able to talk in sentences with only short breaks for a deep breath. Resp:  Normal effort  MSK:   Moves extremities without difficulty  Other:    Medical Decision Making  Medically screening exam initiated at 11:50 AM.  Appropriate orders placed.  Randall Vincent was informed that the remainder of the evaluation will be completed by another provider, this initial triage assessment does not replace that evaluation, and the importance of remaining in the ED until their evaluation is complete.     Tommi Rumps, PA-C 12/21/21 1201

## 2021-12-21 NOTE — ED Notes (Signed)
Pt is sitting on the side of the bed eating pizza. Pt's family member is at bedside

## 2021-12-21 NOTE — ED Triage Notes (Addendum)
Pt in from home due to drop in hgb to 7.5; pt had recent CT scan completed; primary provider told pt he is in acute renal failure; pt also told image shows "collapse of his bladder"; pt denies visible blood loss that he is aware of. History of removing 1/3 of colon and portion of bladder per pt in 2000. Pt reports has been able to urinate today within baseline. Pt is on eliquis. Pt in NAD.

## 2021-12-21 NOTE — ED Notes (Signed)
Sent here for renal failure by doctor.  They also said possible transfusion.

## 2021-12-21 NOTE — ED Notes (Signed)
Bradler MD made aware of elevated lactic acid level 2.7

## 2021-12-21 NOTE — H&P (Signed)
History and Physical    Randall Vincent NLZ:[id number] DOB: 27-Nov-1940 DOA: 12/21/2021  PCP: Elspeth Cho., MD  Patient coming from: Home.  Chief Complaint: Low hemoglobin and shortness of breath.  HPI: Randall Vincent is a 81 y.o. male with history of CAD s/p CABG, atrial fibrillation, chronic kidney disease stage III, diabetes mellitus type 2 had followed up with patient's primary care physician had blood work done which showed decreasing hemoglobin.  Patient has not noticed any obvious bleeding.  Patient at the primary care's office also had low blood pressure.  Patient states over the last few days has been getting increasingly weak fatigued and short of breath on exertion.  Denies any chest pain.  After blood work was done patient hemoglobin was found to be around 7 was instructed to come to the ER.  CT scan done as outpatient did not show any obstruction did show some fluid on the abdomen.  ED Course: In the ER patient had blood work done which showed hemoglobin of 7 lactic acid of 2.7 creatinine worsened to 2.5 from 1.6 in May 2023.  ER physician ordered 1 L of normal saline bolus along with 1 unit of PRBC was ordered.  Patient admitted for further observation.  EKG shows normal sinus rhythm.  Review of Systems: As per HPI, rest all negative.   Past Medical History:  Diagnosis Date   A-fib [facility])    a.) CHA2DS2-VASc = 6 (age x 2, CHF, HTN, previous MI, T2DM). b.) Rate/rhythm maintained on amiodarone + diltiazem + carvedilol; chronically anticoagulated using dabigatran. c.) DCCV 07/10/2015, 08/08/2019, 10/22/2019.   Arthritis    Chest pain 06/26/2019   CHF (congestive heart failure) (HCC)    CKD (chronic kidney disease), stage III (HCC)    Coronary artery disease 1991   a.) 2v CABG in 1991. b.) PCI 06/07/2006 --> Vision stents to native LCx and RCA   Hyperlipidemia    Hypertension    Long term current use of anticoagulant    a.) dabigatran   Myocardial infarction (HCC) 1991   a.) LHC  --> 50% and 90% LAD lesions; referred to CVTS. 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   a.) LIMA-LAD, SVG-RCA   T2DM (type 2 diabetes mellitus) (HCC)     Past Surgical History:  Procedure Laterality Date   BACK SURGERY  1970   L5-L6 ruptured disk   BLADDER SURGERY  2000   CARDIOVERSION N/A 07/10/2015   CARDIOVERSION N/A 08/08/2019   CARDIOVERSION N/A 10/22/2019   COLON SURGERY     COLONOSCOPY N/A 01/15/2020   Procedure: COLONOSCOPY;  Surgeon: Pasty Spillers, MD;  Location: [facility] ENDOSCOPY;  Service: Endoscopy;  Laterality: N/A;   COLONOSCOPY N/A 01/16/2020   Procedure: COLONOSCOPY;  Surgeon: Pasty Spillers, MD;  Location: [facility] ENDOSCOPY;  Service: Endoscopy;  Laterality: N/A;   CORONARY ARTERY BYPASS GRAFT N/A 01/02/1990   Procedure: 2v CORONARY ARTERY BYPASS GRAFT (LIMA-LAD, SVG-RCA)   ESOPHAGOGASTRODUODENOSCOPY N/A 01/13/2020   Procedure: ESOPHAGOGASTRODUODENOSCOPY (EGD);  Surgeon: Pasty Spillers, MD;  Location: [facility] ENDOSCOPY;  Service: Endoscopy;  Laterality: N/A;   REPLACEMENT TOTAL KNEE BILATERAL Bilateral 2008   STENT PLACEMENT VASCULAR ([facility] HX)  2008   TOTAL KNEE REVISION Left 01/16/2020   Procedure: TOTAL KNEE REVISION;  Surgeon: Lyndle Herrlich, MD;  Location: [facility] ORS;  Service: Orthopedics;  Laterality: Left;   TOTAL KNEE REVISION Left 05/31/2021   Procedure: TOTAL KNEE REVISION;  Surgeon: Lovell Sheehan, MD;  Location: [facility] ORS;  Service: Orthopedics;  Laterality: Left;     reports that he quit smoking about 32 years ago. His smoking use included cigarettes. He has never used smokeless tobacco. He reports that he does not currently use alcohol after a past usage of about 1.0 standard drink of alcohol per week. No history on file for drug use.  No Known Allergies  History reviewed. No pertinent family history.  Prior to Admission medications   Medication Sig Start Date End Date Taking?  Authorizing Provider  amiodarone (PACERONE) 200 MG tablet Take 200 mg by mouth daily. 10/29/19  Yes [provider]  apixaban (ELIQUIS) 5 MG TABS tablet Take 1 tablet (5 mg total) by mouth 2 (two) times daily. 09/20/21  Yes Shawna Clamp, MD  atorvastatin (LIPITOR) 40 MG tablet Take 40 mg by mouth every evening.  06/18/19  Yes [provider]  carvedilol (COREG) 3.125 MG tablet Take 1 tablet (3.125 mg total) by mouth 2 (two) times daily with a meal. 02/09/20  Yes Wouk, Ailene Rud, MD  diltiazem (CARDIZEM CD) 240 MG 24 hr capsule Take 240 mg by mouth daily. 06/21/21  Yes [provider]  metFORMIN (GLUCOPHAGE-XR) 500 MG 24 hr tablet Take 500 mg by mouth 2 (two) times daily. 04/07/21  Yes [provider]  ramipril (ALTACE) 10 MG capsule Take 1 capsule (10 mg total) by mouth daily. 01/31/20  Yes Fritzi Mandes, MD  torsemide (DEMADEX) 20 MG tablet Take 1 tablet (20 mg total) by mouth 2 (two) times daily. Patient taking differently: Take 10-20 mg by mouth See admin instructions. Take 20 mg by mouth in the morning and 10 mg in the evening 02/09/20  Yes Wouk, Ailene Rud, MD  vitamin B-12 (CYANOCOBALAMIN) 1000 MCG tablet Take 1,000 mcg by mouth daily.   Yes [provider]  colchicine 0.6 MG tablet Take 0.6 mg by mouth 2 (two) times daily. 09/06/21   [provider]  docusate sodium (COLACE) 100 MG capsule Take 1 capsule (100 mg total) by mouth 2 (two) times daily. Patient not taking: Reported on 12/21/2021 06/03/21   Carlynn Spry, PA-C  ferrous sulfate 325 (65 FE) MG tablet Take 325 mg by mouth daily with breakfast. Patient not taking: Reported on 12/21/2021    [provider]  HYDROcodone-acetaminophen (NORCO/VICODIN) 5-325 MG tablet Take 1 tablet by mouth every 4 (four) hours as needed for severe pain. Patient not taking: Reported on 12/21/2021 06/03/21   Carlynn Spry, PA-C  Semaglutide, 1 MG/DOSE, 2 MG/1.5ML SOPN Inject 1 mg into the skin every  Thursday.    [provider]  traMADol (ULTRAM) 50 MG tablet Take 1 tablet (50 mg total) by mouth every 8 (eight) hours as needed for moderate pain or severe pain. Patient not taking: Reported on 12/21/2021 01/31/20   Fritzi Mandes, MD    Physical Exam: Constitutional: Moderately built and nourished. Vitals:   12/21/21 1730 12/21/21 1800 12/21/21 2005 12/21/21 2025  BP: (!) 113/44 (!) 117/51 (!) 110/45 (!) 106/56  Pulse: (!) 58 (!) 58 68 64  Resp: (!) 22 (!) 23 (!) 24 (!) 26  Temp:   98.2 F (36.8 C) 98.2 F (36.8 C)  TempSrc:   Oral   SpO2: 100% 100% 99%   Weight:      Height:       Eyes: Anicteric no pallor. ENMT: No discharge from the ears eyes nose and mouth. Neck: No mass felt.  No neck  rigidity. Respiratory: No rhonchi or crepitations. Cardiovascular: S1-S2 heard. Abdomen: Soft nontender bowel sound present. Musculoskeletal: Bilateral lower extremity edema present. Skin: Chronic skin changes. Neurologic: Alert awake oriented time place and person.  Moves all extremities. Psychiatric: Appears normal.  Normal affect.   Labs on Admission: I have personally reviewed following labs and imaging studies  CBC: Recent Labs  Lab 12/21/21 1153  WBC 9.9  NEUTROABS 8.8*  HGB 7.0*  HCT 24.9*  MCV 82.2  PLT 123456   Basic Metabolic Panel: Recent Labs  Lab 12/21/21 1153  NA 138  K 3.7  CL 103  CO2 25  GLUCOSE 192*  BUN 61*  CREATININE 2.59*  CALCIUM 8.2*   GFR: Estimated Creatinine Clearance: 25.9 mL/min (A) (by C-G formula based on SCr of 2.59 mg/dL (H)). Liver Function Tests: Recent Labs  Lab 12/21/21 1153  AST 32  ALT 26  ALKPHOS 90  BILITOT 1.0  PROT 6.8  ALBUMIN 2.9*   No results for input(s): "LIPASE", "AMYLASE" in the last 168 hours. No results for input(s): "AMMONIA" in the last 168 hours. Coagulation Profile: No results for input(s): "INR", "PROTIME" in the last 168 hours. Cardiac Enzymes: No results for input(s): "CKTOTAL", "CKMB",  "CKMBINDEX", "TROPONINI" in the last 168 hours. BNP (last 3 results) No results for input(s): "PROBNP" in the last 8760 hours. HbA1C: No results for input(s): "HGBA1C" in the last 72 hours. CBG: No results for input(s): "GLUCAP" in the last 168 hours. Lipid Profile: No results for input(s): "CHOL", "HDL", "LDLCALC", "TRIG", "CHOLHDL", "LDLDIRECT" in the last 72 hours. Thyroid Function Tests: No results for input(s): "TSH", "T4TOTAL", "FREET4", "T3FREE", "THYROIDAB" in the last 72 hours. Anemia Panel: No results for input(s): "VITAMINB12", "FOLATE", "FERRITIN", "TIBC", "IRON", "RETICCTPCT" in the last 72 hours. Urine analysis:    Component Value Date/Time   COLORURINE YELLOW (A) 12/21/2021 1545   APPEARANCEUR CLEAR (A) 12/21/2021 1545   LABSPEC 1.010 12/21/2021 1545   PHURINE 5.0 12/21/2021 1545   GLUCOSEU NEGATIVE 12/21/2021 1545   HGBUR NEGATIVE 12/21/2021 1545   BILIRUBINUR NEGATIVE 12/21/2021 1545   KETONESUR NEGATIVE 12/21/2021 1545   PROTEINUR NEGATIVE 12/21/2021 1545   NITRITE NEGATIVE 12/21/2021 1545   LEUKOCYTESUR NEGATIVE 12/21/2021 1545   Sepsis Labs: @LABRCNTIP (procalcitonin:4,lacticidven:4) )No results found for this or any previous visit (from the past 240 hour(s)).   Radiological Exams on Admission: US Renal  Result Date: 12/21/2021 CLINICAL DATA:  Acute renal failure. EXAM: RENAL / URINARY TRACT ULTRASOUND COMPLETE COMPARISON:  Renal ultrasound 01/27/2020 FINDINGS: Right Kidney: Renal measurements: 10.8 x 5.2 x 4.6 cm = volume: 135 mL. Echogenicity within normal limits. There is a 10 mm benign simple cyst within the midpole. No follow-up imaging is recommended. Left Kidney: Renal measurements: 11.5 x 4.6 x 4.8 cm = volume: 131 mL. Echogenicity within normal limits. There is a 12 mm benign simple cyst within the left midpole. No follow-up imaging is recommended. Bladder: Appears normal for degree of bladder distention. Other: None. IMPRESSION: Bilateral individual  benign renal simple cysts. No follow-up imaging recommended. No hydronephrosis. Electronically Signed   By: Yvonne Kendall M.D.   On: 12/21/2021 16:15    EKG: Independently reviewed.  Normal sinus rhythm.  Assessment/Plan Principal Problem:   Symptomatic anemia Active Problems:   CAD S/P CABG x 2   Essential hypertension   DM (diabetes mellitus), type 2 (HCC)   Acute-on-chronic kidney injury (HCC)    Symptomatic anemia -anemia likely from renal disease which has been progressively worsening.  Patient did receive  1 unit of PRBC transfusion.  Follow CBC in the morning.  Patient has had a colonoscopy in 2021 which showed diverticulosis. Acute on chronic kidney disease stage III creatinine has worsened from May 2023.  Patient states he was found to be hypotensive recently.  Patient received 1 unit of PRBC and also 1 L fluid bolus.  Blood pressure is 114 systolic at this time with a MAP of 65.  We will hold off any diuretics for now until lactic acid improves and also mean arterial pressure improves.  Clinically patient does have fluid in the lower extremities.  Repeat metabolic panel in the morning along with lactic acid.  Hold ACE inhibitors due to worsening renal function. A-fib rate controlled on amiodarone Coreg and Cardizem.  Eliquis for anticoagulation.  Eliquis dose may need to be adjusted if creatinine further worsens. CAD status post CABG denies any chest pain. Chronic systolic heart failure last EF measured in April 2023 was 45 to 50%.  Patient does have lower extremity edema.  Bowel worsens blood pressure and MAP on the lower side holding diuretics for now monitor may reconsider in the morning.  Holding ACE inhibitors due to worsening renal function. Diabetes mellitus type 2 we will keep patient on sliding scale coverage.   DVT prophylaxis: Eliquis. Code Status: Full code. Family Communication: Patient's son at the bedside. Disposition Plan: Home. Consults called: None. Admission  status: Observation.   Eduard Clos MD [facility] Pager [phone].  If 7PM-7AM, please contact night-coverage [email or web address] Password [facility]  12/21/2021, 9:36 PM

## 2021-12-21 NOTE — ED Provider Notes (Signed)
[facility] Provider Note   Event Date/Time   First MD Initiated Contact with Patient 12/21/21 1457     (approximate) History  No chief complaint on file.  HPI Marvel Mcphillips is a 81 y.o. male with a stated past medical history of CHF, chronic kidney disease, and blindness in the left eye who presents for dehydration, hypotension, shortness of breath, and worsening renal function.  Patient was seen by his primary care physician yesterday where labs were drawn and he was called back today to let him know that his hemoglobin was at 7.5 and that his creatinine has been worsening.  Patient states that he has been having difficulty urinating despite increasing his spironolactone recently.  Patient endorses significant dyspnea on exertion only being able to climb approximately 4 steps without getting severely short of breath ROS: Patient currently denies any vision changes, tinnitus, difficulty speaking, facial droop, sore throat, chest pain, abdominal pain, nausea/vomiting/diarrhea, dysuria, or weakness/numbness/paresthesias in any extremity   Physical Exam  Triage Vital Signs: ED Triage Vitals  Enc Vitals Group     BP 12/21/21 1143 (!) 103/52     Pulse Rate 12/21/21 1143 87     Resp 12/21/21 1143 17     Temp 12/21/21 1144 98.3 F (36.8 C)     Temp Source 12/21/21 1144 Oral     SpO2 12/21/21 1143 98 %     Weight 12/21/21 1206 211 lb (95.7 kg)     Height 12/21/21 1206 5\' 8"  (1.727 m)     Head Circumference --      Peak Flow --      Pain Score 12/21/21 1205 0     Pain Loc --      Pain Edu? --      Excl. in GC? --    Most recent vital signs: Vitals:   12/21/21 2230 12/21/21 2353  BP: (!) 109/49 (!) 126/50  Pulse: (!) 57 (!) 56  Resp: (!) 22 20  Temp:  97.6 F (36.4 C)  SpO2: 97% 98%   General: Awake, oriented x4. CV:  Good peripheral perfusion.  Resp:  Normal effort.  Abd:  Distended with no fluid wave however pitting edema Other:  Obese elderly  Caucasian male laying in bed in no acute distress ED Results / Procedures / Treatments  Labs (all labs ordered are listed, but only abnormal results are displayed) Labs Reviewed  COMPREHENSIVE METABOLIC PANEL - Abnormal; Notable for the following components:      Result Value   Glucose, Bld 192 (*)    BUN 61 (*)    Creatinine, Ser 2.59 (*)    Calcium 8.2 (*)    Albumin 2.9 (*)    GFR, Estimated 24 (*)    All other components within normal limits  CBC WITH DIFFERENTIAL/PLATELET - Abnormal; Notable for the following components:   RBC 3.03 (*)    Hemoglobin 7.0 (*)    HCT 24.9 (*)    MCH 23.1 (*)    MCHC 28.1 (*)    RDW 17.5 (*)    Neutro Abs 8.8 (*)    Lymphs Abs 0.4 (*)    All other components within normal limits  LACTIC ACID, PLASMA - Abnormal; Notable for the following components:   Lactic Acid, Venous 2.7 (*)    All other components within normal limits  URINALYSIS, ROUTINE W REFLEX MICROSCOPIC - Abnormal; Notable for the following components:   Color, Urine YELLOW (*)    APPearance CLEAR (*)  All other components within normal limits  LACTIC ACID, PLASMA  COMPREHENSIVE METABOLIC PANEL  CBC  LACTIC ACID, PLASMA  VITAMIN B12  FOLATE  IRON AND TIBC  FERRITIN  RETICULOCYTES  TYPE AND SCREEN  PREPARE RBC (CROSSMATCH)  TROPONIN I (HIGH SENSITIVITY)   EKG ED ECG REPORT I, Naaman Plummer, the attending physician, personally viewed and interpreted this ECG. Date: 12/21/2021 EKG Time: 1438 Rate: 62 Rhythm: normal sinus rhythm QRS Axis: normal Intervals: normal ST/T Wave abnormalities: normal Narrative Interpretation: no evidence of acute ischemia RADIOLOGY ED MD interpretation: CT of the abdomen and pelvis performed on 12/20/2021 showed bilateral perinephric stranding, bilateral small pleural effusions, a iliac lymph node that was enlarged, -Agree with radiology assessment Official radiology report(s): US Renal  Result Date: 12/21/2021 CLINICAL DATA:  Acute  renal failure. EXAM: RENAL / URINARY TRACT ULTRASOUND COMPLETE COMPARISON:  Renal ultrasound 01/27/2020 FINDINGS: Right Kidney: Renal measurements: 10.8 x 5.2 x 4.6 cm = volume: 135 mL. Echogenicity within normal limits. There is a 10 mm benign simple cyst within the midpole. No follow-up imaging is recommended. Left Kidney: Renal measurements: 11.5 x 4.6 x 4.8 cm = volume: 131 mL. Echogenicity within normal limits. There is a 12 mm benign simple cyst within the left midpole. No follow-up imaging is recommended. Bladder: Appears normal for degree of bladder distention. Other: None. IMPRESSION: Bilateral individual benign renal simple cysts. No follow-up imaging recommended. No hydronephrosis. Electronically Signed   By: Yvonne Kendall M.D.   On: 12/21/2021 16:15   PROCEDURES: Critical Care performed: Yes, see critical care procedure note(s) .1-3 Lead EKG Interpretation  Performed by: Naaman Plummer, MD Authorized by: Naaman Plummer, MD     Interpretation: normal     ECG rate:  66   ECG rate assessment: normal     Rhythm: sinus rhythm     Ectopy: none     Conduction: normal   CRITICAL CARE Performed by: Naaman Plummer  Total critical care time: 33 minutes  Critical care time was exclusive of separately billable procedures and treating other patients.  Critical care was necessary to treat or prevent imminent or life-threatening deterioration.  Critical care was time spent personally by me on the following activities: development of treatment plan with patient and/or surrogate as well as nursing, discussions with consultants, evaluation of patient's response to treatment, examination of patient, obtaining history from patient or surrogate, ordering and performing treatments and interventions, ordering and review of laboratory studies, ordering and review of radiographic studies, pulse oximetry and re-evaluation of patient's condition.  MEDICATIONS ORDERED IN ED: Medications  amiodarone  (PACERONE) tablet 200 mg (has no administration in time range)  atorvastatin (LIPITOR) tablet 40 mg (has no administration in time range)  carvedilol (COREG) tablet 3.125 mg (3.125 mg Oral Not Given 12/22/21 0018)  diltiazem (CARDIZEM CD) 24 hr capsule 240 mg (has no administration in time range)  apixaban (ELIQUIS) tablet 5 mg (has no administration in time range)  cyanocobalamin (VITAMIN B12) tablet 1,000 mcg (has no administration in time range)  insulin aspart (novoLOG) injection 0-9 Units (has no administration in time range)  acetaminophen (TYLENOL) tablet 650 mg (has no administration in time range)    Or  acetaminophen (TYLENOL) suppository 650 mg (has no administration in time range)  sodium chloride 0.9 % bolus 1,000 mL (0 mLs Intravenous Stopped 12/21/21 1827)   IMPRESSION / MDM / ASSESSMENT AND PLAN / ED COURSE  I reviewed the triage vital signs and the nursing notes.  Differential diagnosis includes, but is not limited to, acute GI bleeding, gastric ulcer, aortoenteric fistula, acute renal failure The patient is on the cardiac monitor to evaluate for evidence of arrhythmia and/or significant heart rate changes. Patient's presentation is most consistent with acute presentation with potential threat to life or bodily function. Patient is a 81 year old male with the above-stated past medical history presents for worsening dyspnea on exertion in the setting of recent labs that showed worsening kidney function as well as worsening anemia with hemoglobin of 7.5.  Patient's hemoglobin today is 7.0.  Patient's creatinine and BUN have significantly worsened over the past 3 months from 1.6/41 to 2.59/24.  Patient states that he has been urinating less frequently than normal despite increasing his oral diuretic medications.  Patient states that he has had issues with anemia in the past however they have not done endoscopy/colonoscopy with no indication of GI bleeding.   Patient has been on iron in the past and has been taking it recently with no increase in his hemoglobin over this time.  Patient also states that he has a history of chronic kidney disease but has not been on any new medications or cannot identify any other reason for his kidney function to be worsening.  Given his severe symptomatic anemia as well as worsening kidney function, patient will require admission to the internal medicine service for further evaluation and management.  Patient will be transfused at least 1 unit with reassessment afterwards as well as adequate hydration.  Patient received a renal ultrasound that is pending  Consults: Hospitalist agree with plan for admission to the internal medicine service for further evaluation and management  Dispo: Admit to medicine   FINAL CLINICAL IMPRESSION(S) / ED DIAGNOSES   Final diagnoses:  Symptomatic anemia  Acute kidney injury superimposed on chronic kidney disease (HCC)   Rx / DC Orders   ED Discharge Orders     None      Note:  This document was prepared using Dragon voice recognition software and may include unintentional dictation errors.   Merwyn Katos, MD 12/22/21 [id number]

## 2021-12-22 DIAGNOSIS — Z87891 Personal history of nicotine dependence: Secondary | ICD-10-CM | POA: Diagnosis not present

## 2021-12-22 DIAGNOSIS — R238 Other skin changes: Secondary | ICD-10-CM | POA: Diagnosis present

## 2021-12-22 DIAGNOSIS — D649 Anemia, unspecified: Secondary | ICD-10-CM | POA: Diagnosis not present

## 2021-12-22 DIAGNOSIS — I4891 Unspecified atrial fibrillation: Secondary | ICD-10-CM | POA: Diagnosis not present

## 2021-12-22 DIAGNOSIS — N179 Acute kidney failure, unspecified: Secondary | ICD-10-CM | POA: Diagnosis present

## 2021-12-22 DIAGNOSIS — E876 Hypokalemia: Secondary | ICD-10-CM | POA: Diagnosis present

## 2021-12-22 DIAGNOSIS — I252 Old myocardial infarction: Secondary | ICD-10-CM | POA: Diagnosis not present

## 2021-12-22 DIAGNOSIS — Z7901 Long term (current) use of anticoagulants: Secondary | ICD-10-CM | POA: Diagnosis not present

## 2021-12-22 DIAGNOSIS — N1831 Chronic kidney disease, stage 3a: Secondary | ICD-10-CM | POA: Diagnosis present

## 2021-12-22 DIAGNOSIS — G4733 Obstructive sleep apnea (adult) (pediatric): Secondary | ICD-10-CM | POA: Diagnosis present

## 2021-12-22 DIAGNOSIS — Z66 Do not resuscitate: Secondary | ICD-10-CM | POA: Diagnosis present

## 2021-12-22 DIAGNOSIS — T45515A Adverse effect of anticoagulants, initial encounter: Secondary | ICD-10-CM | POA: Diagnosis present

## 2021-12-22 DIAGNOSIS — E11311 Type 2 diabetes mellitus with unspecified diabetic retinopathy with macular edema: Secondary | ICD-10-CM | POA: Diagnosis present

## 2021-12-22 DIAGNOSIS — I5022 Chronic systolic (congestive) heart failure: Secondary | ICD-10-CM | POA: Diagnosis present

## 2021-12-22 DIAGNOSIS — Z515 Encounter for palliative care: Secondary | ICD-10-CM | POA: Diagnosis not present

## 2021-12-22 DIAGNOSIS — Z7189 Other specified counseling: Secondary | ICD-10-CM

## 2021-12-22 DIAGNOSIS — E785 Hyperlipidemia, unspecified: Secondary | ICD-10-CM | POA: Diagnosis present

## 2021-12-22 DIAGNOSIS — E860 Dehydration: Secondary | ICD-10-CM | POA: Diagnosis present

## 2021-12-22 DIAGNOSIS — D500 Iron deficiency anemia secondary to blood loss (chronic): Secondary | ICD-10-CM | POA: Diagnosis present

## 2021-12-22 DIAGNOSIS — Z96653 Presence of artificial knee joint, bilateral: Secondary | ICD-10-CM | POA: Diagnosis present

## 2021-12-22 DIAGNOSIS — Z96652 Presence of left artificial knee joint: Secondary | ICD-10-CM | POA: Diagnosis not present

## 2021-12-22 DIAGNOSIS — Z794 Long term (current) use of insulin: Secondary | ICD-10-CM

## 2021-12-22 DIAGNOSIS — I482 Chronic atrial fibrillation, unspecified: Secondary | ICD-10-CM | POA: Diagnosis present

## 2021-12-22 DIAGNOSIS — Z951 Presence of aortocoronary bypass graft: Secondary | ICD-10-CM | POA: Diagnosis not present

## 2021-12-22 DIAGNOSIS — Z79899 Other long term (current) drug therapy: Secondary | ICD-10-CM | POA: Diagnosis not present

## 2021-12-22 DIAGNOSIS — Z7985 Long-term (current) use of injectable non-insulin antidiabetic drugs: Secondary | ICD-10-CM | POA: Diagnosis not present

## 2021-12-22 DIAGNOSIS — Z7984 Long term (current) use of oral hypoglycemic drugs: Secondary | ICD-10-CM | POA: Diagnosis not present

## 2021-12-22 DIAGNOSIS — E119 Type 2 diabetes mellitus without complications: Secondary | ICD-10-CM

## 2021-12-22 DIAGNOSIS — I2510 Atherosclerotic heart disease of native coronary artery without angina pectoris: Secondary | ICD-10-CM | POA: Diagnosis present

## 2021-12-22 DIAGNOSIS — I130 Hypertensive heart and chronic kidney disease with heart failure and stage 1 through stage 4 chronic kidney disease, or unspecified chronic kidney disease: Secondary | ICD-10-CM | POA: Diagnosis present

## 2021-12-22 DIAGNOSIS — E1122 Type 2 diabetes mellitus with diabetic chronic kidney disease: Secondary | ICD-10-CM | POA: Diagnosis present

## 2021-12-22 LAB — CBC
HCT: 25.2 % — ABNORMAL LOW (ref 39.0–52.0)
Hemoglobin: 7.6 g/dL — ABNORMAL LOW (ref 13.0–17.0)
MCH: 24.4 pg — ABNORMAL LOW (ref 26.0–34.0)
MCHC: 30.2 g/dL (ref 30.0–36.0)
MCV: 81 fL (ref 80.0–100.0)
Platelets: 183 10*3/uL (ref 150–400)
RBC: 3.11 MIL/uL — ABNORMAL LOW (ref 4.22–5.81)
RDW: 17.2 % — ABNORMAL HIGH (ref 11.5–15.5)
WBC: 7.5 10*3/uL (ref 4.0–10.5)
nRBC: 0 % (ref 0.0–0.2)

## 2021-12-22 LAB — COMPREHENSIVE METABOLIC PANEL
ALT: 29 U/L (ref 0–44)
AST: 34 U/L (ref 15–41)
Albumin: 2.6 g/dL — ABNORMAL LOW (ref 3.5–5.0)
Alkaline Phosphatase: 87 U/L (ref 38–126)
Anion gap: 8 (ref 5–15)
BUN: 65 mg/dL — ABNORMAL HIGH (ref 8–23)
CO2: 25 mmol/L (ref 22–32)
Calcium: 7.8 mg/dL — ABNORMAL LOW (ref 8.9–10.3)
Chloride: 107 mmol/L (ref 98–111)
Creatinine, Ser: 2.39 mg/dL — ABNORMAL HIGH (ref 0.61–1.24)
GFR, Estimated: 27 mL/min — ABNORMAL LOW (ref >60)
Glucose, Bld: 119 mg/dL — ABNORMAL HIGH (ref 70–99)
Potassium: 3.4 mmol/L — ABNORMAL LOW (ref 3.5–5.1)
Sodium: 140 mmol/L (ref 135–145)
Total Bilirubin: 1 mg/dL (ref 0.3–1.2)
Total Protein: 6.1 g/dL — ABNORMAL LOW (ref 6.5–8.1)

## 2021-12-22 LAB — FERRITIN: Ferritin: 53 ng/mL (ref 24–336)

## 2021-12-22 LAB — HEMOGLOBIN AND HEMATOCRIT, BLOOD
HCT: 27.7 % — ABNORMAL LOW (ref 39.0–52.0)
Hemoglobin: 8.5 g/dL — ABNORMAL LOW (ref 13.0–17.0)

## 2021-12-22 LAB — VITAMIN B12: Vitamin B-12: 482 pg/mL (ref 180–914)

## 2021-12-22 LAB — RETICULOCYTES
Immature Retic Fract: 20.6 % — ABNORMAL HIGH (ref 2.3–15.9)
RBC.: 3.18 MIL/uL — ABNORMAL LOW (ref 4.22–5.81)
Retic Count, Absolute: 61.1 10*3/uL (ref 19.0–186.0)
Retic Ct Pct: 1.9 % (ref 0.4–3.1)

## 2021-12-22 LAB — GLUCOSE, CAPILLARY
Glucose-Capillary: 133 mg/dL — ABNORMAL HIGH (ref 70–99)
Glucose-Capillary: 112 mg/dL — ABNORMAL HIGH (ref 70–99)
Glucose-Capillary: 142 mg/dL — ABNORMAL HIGH (ref 70–99)
Glucose-Capillary: 108 mg/dL — ABNORMAL HIGH (ref 70–99)
Glucose-Capillary: 152 mg/dL — ABNORMAL HIGH (ref 70–99)

## 2021-12-22 LAB — IRON AND TIBC
Iron: 21 ug/dL — ABNORMAL LOW (ref 45–182)
Saturation Ratios: 7 % — ABNORMAL LOW (ref 17.9–39.5)
TIBC: 286 ug/dL (ref 250–450)
UIBC: 265 ug/dL

## 2021-12-22 LAB — LACTIC ACID, PLASMA
Lactic Acid, Venous: 1.5 mmol/L (ref 0.5–1.9)
Lactic Acid, Venous: 1.2 mmol/L (ref 0.5–1.9)

## 2021-12-22 LAB — FOLATE: Folate: 9.5 ng/mL (ref >5.9)

## 2021-12-22 LAB — PREPARE RBC (CROSSMATCH)

## 2021-12-22 LAB — TROPONIN I (HIGH SENSITIVITY): Troponin I (High Sensitivity): 37 ng/L — ABNORMAL HIGH (ref <18)

## 2021-12-22 MED ORDER — SODIUM CHLORIDE 0.9% IV SOLUTION: Freq: Once | INTRAVENOUS | Status: AC

## 2021-12-22 MED ORDER — POTASSIUM CHLORIDE CRYS ER 20 MEQ PO TBCR
40.0000 meq | EXTENDED_RELEASE_TABLET | Freq: Once | ORAL | Status: AC
  Administered 2021-12-22: 40 meq via ORAL
  Filled 2021-12-22: qty 2

## 2021-12-22 NOTE — Assessment & Plan Note (Signed)
See WOC nurse note.  I agree with her assessment and plan.  Patient has bilateral ischial tuberosity areas showing signs of chronic frictional injury to the constant irritation of the skin

## 2021-12-22 NOTE — Progress Notes (Signed)
Patient was admitted from the ED for symptomatic anemia. Received 1U PRBCs while in ED. HGB increased from 7-7.6. During skin assessment, noticed that patient  had a wound to his right posterior/slightly inner thigh that had blood pooling with a steady trickle of blood. Placed pressure on wound and notified provider Katy. Patient stated that he has had the wound for months and that he always places a towel on area to stop the bleeding. Patient is on eliquis with a hx of a CABG. I educated patient that whenever he has any bleeding, it is to be reported to his doctor due to the medication that he is on. Patient also has bilateral lower leg discoloration that resembles cellulitis w/o weeping. Wound consult placed. Patient also voiced having difficulty starting his urine stream. He was able to urinate 400 mls. Labs show that BUN and creatinine is elevated. Stated that he has had multiple falls before admission. States that he lives alone in a camper in his son's yard. No home health involved. Patient is blind in L-eye from a previous trauma accident. Has no teeth or dentures but voices that he eats steak. Denied pain.

## 2021-12-22 NOTE — Assessment & Plan Note (Signed)
Replete and recheck, check magnesium 

## 2021-12-22 NOTE — Assessment & Plan Note (Signed)
Follows with Dr. Fath as an outpatient.  Continue his cardiac meds. 

## 2021-12-22 NOTE — Assessment & Plan Note (Signed)
Hemoglobin trending down for last 2 years likely slow chronic bleed being on anticoagulant.  Patient/daughter would like to discuss with his outpatient cardiology Dr. Lady Gary to decide on long-term anticoagulation to see if they will allow cut the dose considering his anemia.  Patient denies any obvious GI bleed/melena.  He has received 1 PRBC transfusion on admission will order 1 more as his hemoglobin is still 7.6 considering his extensive cardiac history.  Patient and daughter are in agreement     Latest Ref Rng & Units 12/22/2021    2:17 AM 12/21/2021   11:53 AM 09/20/2021    3:53 AM  CBC  WBC 4.0 - 10.5 K/uL 7.5  9.9  8.6   Hemoglobin 13.0 - 17.0 g/dL 7.6  7.0  8.4   Hematocrit 39.0 - 52.0 % 25.2  24.9  26.5   Platelets 150 - 400 K/uL 183  230  214

## 2021-12-22 NOTE — Hospital Course (Addendum)
82 y.o. male with history of CAD s/p CABG, atrial fibrillation, chronic kidney disease stage III, diabetes mellitus type 2 he is admitted for symptomatic anemia  8/2: Status post 1 PRBC on admission.  Getting 1 more PRBC today.  Patient/daughter debating on anticoagulation but would like to discuss with his cardiologist.  Not interested in any luminal evaluation, WOC nurse consult 8/3: Feeling weak

## 2021-12-22 NOTE — TOC Initial Note (Signed)
Transition of Care [facility]) - Initial/Assessment Note    Patient Details  Name: Randall Vincent MRN: [id number] Date of Birth: 1940-10-08  Transition of [facility]) CM/SW Contact:    Chapman Fitch, RN Phone Number: 12/22/2021, 1:49 PM  Clinical Narrative:                    Admitted for: GI bleed   Admitted from: home alone. States son lives 50 yards from him PCP: terrel [facility] Current home health/prior home health/DME: [facility]  Patient states that his son will be picking him up at discharge. Discussed option for wound care home health. Patient declines         Patient Goals and CMS Choice        Expected Discharge Plan and Services                                                Prior Living Arrangements/Services                       Activities of Daily Living Home Assistive Devices/Equipment: Cane (specify quad or straight) (straight) ADL Screening (condition at time of admission) Patient's cognitive ability adequate to safely complete daily activities?: Yes Is the patient deaf or have difficulty hearing?: No Does the patient have difficulty seeing, even when wearing glasses/contacts?: No Does the patient have difficulty concentrating, remembering, or making decisions?: No Patient able to express need for assistance with ADLs?: Yes Does the patient have difficulty dressing or bathing?: No Independently performs ADLs?: Yes (appropriate for developmental age) Communication: Independent Dressing (OT): Needs assistance Is this a change from baseline?: Change from baseline, expected to last <3days Grooming: Independent Feeding: Independent Bathing: Needs assistance Is this a change from baseline?: Change from baseline, expected to last <3 days Toileting: Independent with device (comment) In/Out Bed: Independent with device (comment) Walks in Home: Independent with device (comment) Does the patient have difficulty walking or  climbing stairs?: Yes Weakness of Legs: Both Weakness of Arms/Hands: Right  Permission Sought/Granted                  Emotional Assessment              Admission diagnosis:  Symptomatic anemia [D64.9] Patient Active Problem List   Diagnosis Date Noted   Hypokalemia 12/22/2021   Skin irritation 12/22/2021   Symptomatic anemia 12/21/2021   History of GI bleed    Hyperbilirubinemia    Status post revision of total knee replacement, left 05/31/2021   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   CHF (congestive heart failure) (HCC) 02/10/2020   CHF exacerbation (HCC) 02/03/2020   Acute exacerbation of CHF (congestive heart failure) (HCC) 02/02/2020   Pressure injury of skin 01/30/2020   Acute on chronic diastolic CHF (congestive heart failure) (HCC) 01/27/2020   Acute renal failure superimposed on stage 3a chronic kidney disease (HCC) 01/27/2020   A-fib (HCC)    Obesity, Class III, BMI 40-49.9 (morbid obesity) (HCC)    Diverticulosis of colon without hemorrhage    Intestinal bypass or anastomosis status    Melena 01/10/2020   Chronic anemia 01/10/2020   Acute blood loss anemia 01/10/2020   Suspect Septic arthritis (HCC) 01/10/2020   Chronic anticoagulation 01/10/2020   History of bilateral knee replacement 01/10/2020   Chronic kidney disease,  stage 3a (HCC) 01/10/2020   Acute on chronic congestive heart failure (HCC) 01/10/2020   Elevated troponin 01/10/2020   Hyperlipidemia associated with type 2 diabetes mellitus (HCC) 07/04/2019   CAD, multiple vessel 06/26/2019   CAD 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/26/2019   Atrial fibrillation status post cardioversion 10/22/19 (HCC) 06/26/2019   Essential hypertension 06/26/2019   DM (diabetes mellitus), type 2 (HCC) 06/26/2019   Anginal equivalent (HCC) 06/26/2019   Drug-induced constipation 11/02/2018   Low serum vitamin B12 11/02/2018   Venous stasis dermatitis of both lower extremities 11/17/2017   Ventral hernia without obstruction or gangrene  09/08/2016   Peripheral edema 04/22/2016   Hay fever 08/13/2015   Impacted cerumen of both ears 08/13/2015   Seasonal allergic rhinitis due to pollen 08/13/2015   Bilateral edema of lower extremity 08/05/2015   Localized edema 06/17/2015   DOE (dyspnea on exertion) 05/19/2015   Hyperlipidemia 05/19/2015   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

## 2021-12-22 NOTE — Progress Notes (Signed)
Progress Note   Patient: Randall Vincent OYD:[id number] DOB: [birth year] DOA: 12/21/2021     0 DOS: the patient was seen and examined on 12/22/2021   Brief hospital course: 81 y.o. male with history of CAD s/p CABG, atrial fibrillation, chronic kidney disease stage III, diabetes mellitus type 2 he is admitted for symptomatic anemia  8/2: Status post 1 PRBC on admission.  Getting 1 more PRBC today.  Patient/daughter debating on anticoagulation but would like to discuss with his cardiologist.  Not interested in any luminal evaluation, WOC nurse consult   Assessment and Plan: * Symptomatic anemia Hemoglobin trending down for last 2 years likely slow chronic bleed being on anticoagulant.  Patient/daughter would like to discuss with his outpatient cardiology Dr. Lady Gary to decide on long-term anticoagulation to see if they will allow cut the dose considering his anemia.  Patient denies any obvious GI bleed/melena.  He has received 1 PRBC transfusion on admission will order 1 more as his hemoglobin is still 7.6 considering his extensive cardiac history.  Patient and daughter are in agreement     Latest Ref Rng & Units 12/22/2021    2:17 AM 12/21/2021   11:53 AM 09/20/2021    3:53 AM  CBC  WBC 4.0 - 10.5 K/uL 7.5  9.9  8.6   Hemoglobin 13.0 - 17.0 g/dL 7.6  7.0  8.4   Hematocrit 39.0 - 52.0 % 25.2  24.9  26.5   Platelets 150 - 400 K/uL 183  230  214     Atrial fibrillation status post cardioversion 10/22/19 (HCC) Continue Coreg, amiodarone and Cardizem for rate control.  Eliquis for anticoagulation  CAD S/P CABG x 2 Follows with Dr. Lady Gary as an outpatient.  Continue his cardiac meds  Skin irritation See WOC nurse note.  I agree with her assessment and plan.  Patient has bilateral ischial tuberosity areas showing signs of chronic frictional injury to the constant irritation of the skin    Hypokalemia Replete and recheck, check magnesium  Acute renal failure superimposed on stage 3a chronic kidney  disease (HCC) Gentle hydration, avoid for toxic medication and monitor renal function  Lab Results  Component Value Date   CREATININE 2.39 (H) 12/22/2021   CREATININE 2.59 (H) 12/21/2021   CREATININE 1.67 (H) 09/20/2021    DM (diabetes mellitus), type 2 (HCC) Continue sliding scale for now.  Hemoglobin A1c 6.5  Essential hypertension Continue Coreg and Cardizem, amiodarone        Subjective: Feeling weak  Physical Exam: Vitals:   12/22/21 0632 12/22/21 0813 12/22/21 1210 12/22/21 1238  BP:  (!) 119/50 (!) 121/59 (!) 129/55  Pulse:  67 (!) 58 75  Resp:  18  18  Temp:  98 F (36.7 C) 97.6 F (36.4 C) (!) 97.5 F (36.4 C)  TempSrc:  Oral Oral Oral  SpO2:  95% 97% 99%  Weight: 99.8 kg     Height:       81 year old male lying in the bed comfortably without any acute distress Lungs clear to auscultation bilaterally Cardiovascular irregularly irregular heart rate Abdomen soft, benign Neuro alert and oriented, nonfocal Psych normal mood and affect Skin: See image below    Data Reviewed:  Potassium 3.4, hemoglobin 7.6  Family Communication: Daughter updated at bedside  Disposition: Status is: Observation The patient remains OBS appropriate and will d/c before 2 midnights.  Planned Discharge Destination: Home    DVT prophylaxis-Eliquis Time spent: 35 minutes  Author: Delfino Lovett, MD 12/22/2021 12:39 PM  For on call review [email or web address].

## 2021-12-22 NOTE — Assessment & Plan Note (Signed)
Gentle hydration, avoid for toxic medication and monitor renal function  Lab Results  Component Value Date   CREATININE 2.39 (H) 12/22/2021   CREATININE 2.59 (H) 12/21/2021   CREATININE 1.67 (H) 09/20/2021

## 2021-12-22 NOTE — Assessment & Plan Note (Signed)
Continue Coreg, amiodarone and Cardizem for rate control.  Eliquis for anticoagulation

## 2021-12-22 NOTE — Consult Note (Signed)
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 Patient sites I a recliner chair most of time (sleeps in a recliner chair). Sliding and moisture have contributed to this problem. Purple discoloration is consistent with CFI (also referred to as chronic tissue injury). Wound type: friction plus moisture Pressure Injury POA: N/A Measurement:R>L  Right measures 7cm  x 6cm and left measures 6cm x 4cm with no depth. Deep purple discoloration. Wound bed:N/A Drainage (amount, consistency, odor) None. Patient reports occasional bleeding. Periwound: intact Dressing procedure/placement/frequency: Reduction of friction and pressure redistribution are the goals of therapy. Patient reports using plastic-backed disposable underpads at home. I will provide a pressure redistribution chair pad and have recommended consideration of a low friction coefficient reusable underpad over the pressure redistribution chair cushion. Daughter in a the tine of my consult and we discuss DermaTherapy washable and reusable underpads. She is looking them up on her hand held device now. In the interim, application of moisture barrier ointment (Critic Aid) to the affected area on the ischial tuberosities as well as application of moisturizer (Sween 24) to the bilateral LEs with hemosiderin staining, but no ulceration is recommended.  Guidance is provided for Nursing for the above interventions via the Orders. Heels are to be floated. A sacral prophylactic foam dressing is provided for PI prevention while in bed.  WOC nursing team will not follow, but will remain available to this patient, the nursing and medical teams.  Please re-consult if needed.  Thank you for inviting [place] to participate in this patient's Plan of Care.  Ladona Mow, MSN, RN, CNS, GNP, Leda Min, [employer], [employer] phone:  [phone]

## 2021-12-22 NOTE — Progress Notes (Signed)
       CROSS COVER NOTE  NAME: Randall Vincent MRN: [id number] DOB : 14-Dec-1940    Date of Service   12/22/21  HPI/Events of Note   Notified by nursing staff that [name] was bleeding from a wound on his (R) buttocks. The bleeding was described as a steady trickle that was able to be stopped with a pressure dressing.  [name] reports this wound has been present for months and that whenever there is frictional force it bleeds, typically when using the toilet. He reports no rectal bleeding and that the blood is from the wound and he is able to stop it by placing a towel under the wound and sitting on it to apply pressure.       Interventions   Plan: Pressure dressing Hold Eliquis tonight Consider WOCN consult for recommendations       This document was prepared using Dragon voice recognition software and may include unintentional dictation errors.  Bishop Limbo DNP, MHA, FNP-BC Nurse Practitioner [facility] Pager [phone]

## 2021-12-22 NOTE — Care Management Obs Status (Signed)
MEDICARE OBSERVATION STATUS NOTIFICATION   Patient Details  Name: Randall Vincent MRN: [id number] Date of Birth: [birth year]   Medicare Observation Status Notification Given:  Yes    Chapman Fitch, RN 12/22/2021, 1:47 PM

## 2021-12-22 NOTE — Consult Note (Signed)
Palliative Care Consult Note                                  Date: 12/22/2021   Patient Name: Randall Vincent  DOB: 07/29/1940  MRN: [id number]  Age / Sex: 81 y.o., male  PCP: Dionne Bucy., MD Referring Physician: Max Sane, MD  Reason for Consultation: Establishing goals of care  HPI/Patient Profile: 81 y.o. male  with past medical history of CAD s/p CABG, atrial fibrillation, chronic kidney disease stage III, diabetes mellitus type 2 he is admitted for symptomatic anemia. He was admitted on 12/21/2021 with symptomatic anemia, AFib, AKI on CKD stage 3a.  PMT was consulted for [place] conversations.  Past Medical History:  Diagnosis Date   A-fib [facility])    a.) CHA2DS2-VASc = 6 (age x 2, CHF, HTN, previous MI, T2DM). b.) Rate/rhythm maintained on amiodarone + diltiazem + carvedilol; chronically anticoagulated using dabigatran. c.) DCCV 07/10/2015, 08/08/2019, 10/22/2019.   Arthritis    Chest pain 06/26/2019   CHF (congestive heart failure) (HCC)    CKD (chronic kidney disease), stage III ([place])    Coronary artery disease 1991   a.) 2v CABG in 1991. b.) PCI 06/07/2006 --> Vision stents to native LCx and RCA   Hyperlipidemia    Hypertension    Long term current use of anticoagulant    a.) dabigatran   Myocardial infarction ([place]) 1991   a.) LHC --> 50% and 90% LAD lesions; referred to CVTS. 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   a.) LIMA-LAD, SVG-RCA   T2DM (type 2 diabetes mellitus) ([place])     Subjective:   This NP Walden Field reviewed medical records, received report from team, assessed the patient and then meet at the patient's bedside to discuss diagnosis, prognosis, GOC, EOL wishes disposition and options.  I met with the patient at the bedside. No family was present.   Concept of Palliative Care was introduced as specialized medical care for people and their families living  with serious illness.  If focuses on providing relief from the symptoms and stress of a serious illness.  The goal is to improve quality of life for both the patient and the family. Values and goals of care important to patient and family were attempted to be elicited.  Created space and opportunity for patient  and family to explore thoughts and feelings regarding current medical situation   Natural trajectory and current clinical status were discussed. Questions and concerns addressed. Patient  encouraged to call with questions or concerns.    Patient/Family Understanding of Illness: The patient understands most of her current hemoglobin is low.  He describes dyspnea for 1 month prior to admission.  On Saturday he began to have cold chills, went home and was tired, sat in recliner for 2 days and ate minimally and did not feel like doing anything.  He knows he is previously after extension but has never been this bad.  About 24 years ago he had one third of his colon removed, he notes he has had CKD for years and is at stage III.  We have further discussion about his acute and chronic illnesses to ensure an understanding by the patient on his current state of his health.  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  He has 3 kids (2 sons and a daughter) although 1 son passed away 10 years ago.  His wife passed away 4 years ago and never married for 37 years.  He previously enjoyed fishing, golfing, bowling but is generally unable to do these at this point.  He does enjoy watching TV as well.  Patient Values: Being active, independent, family.  He describes himself as spiritual but not religious.  Goals: In the short-term to get better and go home and back to his normal life.  Related to goals he expresses wish for DNR, no feeding tube.  A MOST form was completed with him.  Today's  Discussion: In addition to discussions described above we had further discussion on multiple topics.  Overall he feels better today.  We discussed possible etiologies behind his anemia including likely multiple factorial state including CKD, bleeding wound, possible nonacute GI bleeding, etc.  He notes that he previously lived in [place] but moved to [facility] about 2 to 3 years ago.  His [facility] care is still at [facility] and he has been seeing this person for 35 years.  He notes that given his anemia he is going to discuss with cardiology about possible reducing his Eliquis dose.  He describes since his partial colectomy he has intermittent issues with defecation and urination.  He describes generally when his bladder is full he is unable to defecate and when his colon is full/needs to have a bowel movement it is difficult to urinate.  He also describes recent balance issues.  We discussed balance in the context of anemia and CKD/AKI.  Hopefully this will improve once his acute issues are improved.  In general he is feeling better today than when he was admitted.  We completed a MOST form (scanned into ACP tab/Vynca, documented below).  I also completed a goals of care document also located in ACP/Vynca.  I provided emotional and general support through therapeutic listening, empathy, sharing of stories, and other techniques. I answered all questions and addressed all concerns to the best of my ability.   Review of Systems  Constitutional:  Negative for fatigue (Improved with transfusion).       Feels better overall. No pain overall  Respiratory:  Positive for shortness of breath (Improved).   Cardiovascular:  Positive for leg swelling.  Gastrointestinal:  Positive for constipation (intermittently).  Genitourinary:  Positive for difficulty urinating (intermittently).    Objective:   Primary Diagnoses: Present on Admission:  Symptomatic anemia  Essential hypertension  Acute renal  failure superimposed on stage 3a chronic kidney disease (HCC)  Atrial fibrillation status post cardioversion 10/22/19 ([place])  Hypokalemia  Skin irritation   Physical Exam Vitals and nursing note reviewed.  Constitutional:      General: He is not in acute distress.    Appearance: He is obese. He is ill-appearing.  HENT:     Head: Normocephalic and atraumatic.  Cardiovascular:     Rate and Rhythm: Normal rate.  Pulmonary:     Effort: Pulmonary effort is normal. No respiratory distress.  Abdominal:     General: Abdomen is protuberant.     Palpations: Abdomen is soft.  Skin:    General: Skin is warm and dry.  Neurological:     General: No focal deficit present.  Psychiatric:        Mood and Affect: Mood normal.        Behavior: Behavior normal.  Vital Signs:  BP (!) 140/59 (BP Location: Right Arm)   Pulse (!) 59   Temp 98 F (36.7 C) (Oral)   Resp 18   Ht '5\' 8"'  (1.727 m)   Wt 99.8 kg   SpO2 100%   BMI 33.45 kg/m   Palliative Assessment/Data: 40-50%    Advanced Care Planning:   Primary Decision Maker: PATIENT  Code Status/Advance Care Planning: DNR  A discussion was had today regarding advanced directives. Concepts specific to code status, artifical feeding and hydration, continued IV antibiotics and rehospitalization was had.  The difference between a aggressive medical intervention path and a palliative comfort care path for this patient at this time was had. The MOST form was introduced and discussed.  A MOST form was also completed as was the goals of care document.  Per patient request I provided for copies of the MOST document for him to keep a copy at home, copy to both of his surviving children, and copy for his primary care physician.  I encouraged him to share his wishes with his family and PCP.  Decisions/Changes to ACP: Changed to DNR MOST form completed (summary of decisions below)   I completed a MOST form today. The patient and family outlined  their wishes for the following treatment decisions:  Cardiopulmonary Resuscitation: Do Not Attempt Resuscitation (DNR/No CPR)  Medical Interventions: Limited Additional Interventions: Use medical treatment, IV fluids and cardiac monitoring as indicated, DO NOT USE intubation or mechanical ventilation. May consider use of less invasive airway support such as BiPAP or CPAP. Also provide comfort measures. Transfer to the hospital if indicated. Avoid intensive care.   Antibiotics: Determine use of limitation of antibiotics when infection occurs  IV Fluids: IV fluids if indicated  Feeding Tube: No feeding tube     Assessment & Plan:   Impression: 81 year old male with chronic illnesses as described above who is admitted for AKI on CKD and anemia.  Unknown significant source of blood loss.  Likely multifactorial.  He is receiving transfusions now.  Plans to discuss with cardiology about reducing Eliquis dose for A-fib given his bleeding and anemia.  Overall he is feeling better, hopes to be able to discharge soon.  Goals of care were completed and documented as per above.  Copies provided to the patient to share with family and PCP.  Overall prognosis guarded.  SUMMARY OF RECOMMENDATIONS   Changed to DNR MOST form completed and scanned into the system Goals of care document completed in the system Continue current scope of treatment PMT will follow peripherally Goals are clear Please contact us for any significant change or additional needs  Symptom Management:  Per primary team PMT is available to assist as needed  Prognosis:  Unable to determine  Discharge Planning:  To Be Determined   Discussed with: Patient, medical team, nursing team    Thank you for allowing [place] to participate in the care of Randall Vincent PMT will continue to support holistically.  Time Total: 90 Randall  Greater than 50%  of this time was spent counseling and coordinating care related to the above assessment  and plan.  Signed by: Walden Field, NP Palliative Medicine Team  Team Phone # [phone] (Nights/Weekends)  12/22/2021, 3:37 PM

## 2021-12-22 NOTE — Assessment & Plan Note (Signed)
Continue Coreg and Cardizem, amiodarone. 

## 2021-12-22 NOTE — Assessment & Plan Note (Signed)
Continue sliding scale for now.  Hemoglobin A1c 6.5. 

## 2021-12-23 ENCOUNTER — Other Ambulatory Visit: Payer: Medicare Other

## 2021-12-23 DIAGNOSIS — D649 Anemia, unspecified: Secondary | ICD-10-CM | POA: Diagnosis not present

## 2021-12-23 DIAGNOSIS — N179 Acute kidney failure, unspecified: Secondary | ICD-10-CM | POA: Diagnosis not present

## 2021-12-23 DIAGNOSIS — I4891 Unspecified atrial fibrillation: Secondary | ICD-10-CM | POA: Diagnosis not present

## 2021-12-23 DIAGNOSIS — Z951 Presence of aortocoronary bypass graft: Secondary | ICD-10-CM | POA: Diagnosis not present

## 2021-12-23 LAB — BASIC METABOLIC PANEL
Anion gap: 8 (ref 5–15)
BUN: 51 mg/dL — ABNORMAL HIGH (ref 8–23)
CO2: 25 mmol/L (ref 22–32)
Calcium: 8.4 mg/dL — ABNORMAL LOW (ref 8.9–10.3)
Chloride: 107 mmol/L (ref 98–111)
Creatinine, Ser: 2.12 mg/dL — ABNORMAL HIGH (ref 0.61–1.24)
GFR, Estimated: 31 mL/min — ABNORMAL LOW (ref >60)
Glucose, Bld: 142 mg/dL — ABNORMAL HIGH (ref 70–99)
Potassium: 4.5 mmol/L (ref 3.5–5.1)
Sodium: 140 mmol/L (ref 135–145)

## 2021-12-23 LAB — CBC
HCT: 31 % — ABNORMAL LOW (ref 39.0–52.0)
Hemoglobin: 9.2 g/dL — ABNORMAL LOW (ref 13.0–17.0)
MCH: 24.6 pg — ABNORMAL LOW (ref 26.0–34.0)
MCHC: 29.7 g/dL — ABNORMAL LOW (ref 30.0–36.0)
MCV: 82.9 fL (ref 80.0–100.0)
Platelets: 211 10*3/uL (ref 150–400)
RBC: 3.74 MIL/uL — ABNORMAL LOW (ref 4.22–5.81)
RDW: 17.2 % — ABNORMAL HIGH (ref 11.5–15.5)
WBC: 7.7 10*3/uL (ref 4.0–10.5)
nRBC: 0 % (ref 0.0–0.2)

## 2021-12-23 LAB — GLUCOSE, CAPILLARY
Glucose-Capillary: 111 mg/dL — ABNORMAL HIGH (ref 70–99)
Glucose-Capillary: 131 mg/dL — ABNORMAL HIGH (ref 70–99)
Glucose-Capillary: 110 mg/dL — ABNORMAL HIGH (ref 70–99)
Glucose-Capillary: 147 mg/dL — ABNORMAL HIGH (ref 70–99)

## 2021-12-23 NOTE — Plan of Care (Signed)
  Problem: Health Behavior/Discharge Planning: Goal: Ability to manage health-related needs will improve Outcome: Progressing   Problem: Clinical Measurements: Goal: Ability to maintain clinical measurements within normal limits will improve Outcome: Progressing   Problem: Clinical Measurements: Goal: Respiratory complications will improve Outcome: Progressing   Problem: Clinical Measurements: Goal: Cardiovascular complication will be avoided Outcome: Progressing   Problem: Pain Managment: Goal: General experience of comfort will improve Outcome: Progressing   Problem: Safety: Goal: Ability to remain free from injury will improve Outcome: Progressing   

## 2021-12-23 NOTE — Assessment & Plan Note (Signed)
See WOC nurse note.  I agree with her assessment and plan.  Patient has bilateral ischial tuberosity areas showing signs of chronic frictional injury to the constant irritation of the skin.

## 2021-12-23 NOTE — Assessment & Plan Note (Signed)
Gentle hydration, avoid for toxic medication and monitor renal function.  Pending morning labs from today  Lab Results  Component Value Date   CREATININE 2.39 (H) 12/22/2021   CREATININE 2.59 (H) 12/21/2021   CREATININE 1.67 (H) 09/20/2021

## 2021-12-23 NOTE — Progress Notes (Addendum)
Patient is alert and oriented x4. Complained of back pain and received tylenol x2. Wound care completed according to orders and a prophylactic sacral foam placed. Wounds are not currently bleeding. Patient got up to chair x1 last night and wanted purewick removed. Urinated 550 mls of amber urine over the night. Blood glucose was 152 last night. Denied additional needs.

## 2021-12-23 NOTE — Assessment & Plan Note (Signed)
Continue sliding scale for now.  Hemoglobin A1c 6.5.

## 2021-12-23 NOTE — Progress Notes (Signed)
Pt refused bed alarm but was educated about its importance. Will continue to monitor.

## 2021-12-23 NOTE — Progress Notes (Signed)
Progress Note   Patient: Randall Vincent [id number] DOB: [birth year] DOA: 12/21/2021     1 DOS: the patient was seen and examined on 12/23/2021   Brief hospital course: 81 y.o. male with history of CAD s/p CABG, atrial fibrillation, chronic kidney disease stage III, diabetes mellitus type 2 he is admitted for symptomatic anemia  8/2: Status post 1 PRBC on admission.  Getting 1 more PRBC today.  Patient/daughter debating on anticoagulation but would like to discuss with his cardiologist.  Not interested in any luminal evaluation, [facility] nurse consult 8/3: Feeling weak   Assessment and Plan: * Symptomatic anemia Hemoglobin trending down for last 2 years likely slow chronic bleed being on anticoagulant.  Patient/daughter would like to discuss with his outpatient cardiology Dr. Lady Gary to decide on long-term anticoagulation to see if they will allow cut the dose considering his anemia.  Patient denies any obvious GI bleed/melena.  He has received 2 PRBC transfusion thus far.  Pending morning labs today.  Patient and daughter are in agreement.     Latest Ref Rng & Units 12/22/2021    5:56 PM 12/22/2021    2:17 AM 12/21/2021   11:53 AM  CBC  WBC 4.0 - 10.5 K/uL  7.5  9.9   Hemoglobin 13.0 - 17.0 g/dL 8.5  7.6  7.0   Hematocrit 39.0 - 52.0 % 27.7  25.2  24.9   Platelets 150 - 400 K/uL  183  230     Atrial fibrillation status post cardioversion 10/22/19 (HCC) Continue Coreg, amiodarone and Cardizem for rate control.  Eliquis for anticoagulation  CAD S/P CABG x 2 Follows with Dr. Lady Gary as an outpatient.  Continue his cardiac meds.  Skin irritation See [facility] nurse note.  I agree with her assessment and plan.  Patient has bilateral ischial tuberosity areas showing signs of chronic frictional injury to the constant irritation of the skin.    Hypokalemia Replete and recheck, check magnesium.  Acute renal failure superimposed on stage 3a chronic kidney disease (HCC) Gentle hydration, avoid for toxic  medication and monitor renal function.  Pending morning labs from today  Lab Results  Component Value Date   CREATININE 2.39 (H) 12/22/2021   CREATININE 2.59 (H) 12/21/2021   CREATININE 1.67 (H) 09/20/2021    DM (diabetes mellitus), type 2 (HCC) Continue sliding scale for now.  Hemoglobin A1c 6.5.  Essential hypertension Continue Coreg and Cardizem, amiodarone.        Subjective: Feeling weak.  Denies any blood in the stool  Physical Exam: Vitals:   12/22/21 1548 12/22/21 1924 12/23/21 0423 12/23/21 0748  BP: (!) 129/57 (!) 133/57  (!) 140/61  Pulse: (!) 59 (!) 58  (!) 58  Resp: 18 18  18   Temp: 97.7 F (36.5 C) 97.7 F (36.5 C)  97.6 F (36.4 C)  TempSrc: Oral Oral  Oral  SpO2: 98% 97%  99%  Weight:   101.9 kg   Height:       81 year old male lying in the bed comfortably without any acute distress Lungs clear to auscultation bilaterally Cardiovascular irregularly irregular heart rate Abdomen soft, benign Neuro alert and oriented, nonfocal Skin: See image below     Data Reviewed:  There are no new results to review at this time.  Family Communication: None  Disposition: Status is: Inpatient Remains inpatient appropriate because: Pending morning labs.  Would like to make sure H&H is stable.  Still weak likely discharge tomorrow.  Getting ambulated today   Planned Discharge  Destination: Home    DVT prophylaxis-Eliquis Time spent: 35 minutes  Author: Delfino Lovett, MD 12/23/2021 11:10 AM  For on call review [email or web address].

## 2021-12-23 NOTE — Assessment & Plan Note (Signed)
Replete and recheck, check magnesium 

## 2021-12-23 NOTE — Assessment & Plan Note (Signed)
Continue Coreg, amiodarone and Cardizem for rate control.  Eliquis for anticoagulation 

## 2021-12-23 NOTE — Assessment & Plan Note (Signed)
Follows with Dr. Lady Gary as an outpatient.  Continue his cardiac meds.

## 2021-12-23 NOTE — Assessment & Plan Note (Signed)
Continue Coreg and Cardizem, amiodarone.

## 2021-12-23 NOTE — Assessment & Plan Note (Signed)
Hemoglobin trending down for last 2 years likely slow chronic bleed being on anticoagulant.  Patient/daughter would like to discuss with his outpatient cardiology Dr. Lady Gary to decide on long-term anticoagulation to see if they will allow cut the dose considering his anemia.  Patient denies any obvious GI bleed/melena.  He has received 2 PRBC transfusion thus far.  Pending morning labs today.  Patient and daughter are in agreement.     Latest Ref Rng & Units 12/22/2021    5:56 PM 12/22/2021    2:17 AM 12/21/2021   11:53 AM  CBC  WBC 4.0 - 10.5 K/uL  7.5  9.9   Hemoglobin 13.0 - 17.0 g/dL 8.5  7.6  7.0   Hematocrit 39.0 - 52.0 % 27.7  25.2  24.9   Platelets 150 - 400 K/uL  183  230

## 2021-12-24 DIAGNOSIS — N189 Chronic kidney disease, unspecified: Secondary | ICD-10-CM

## 2021-12-24 DIAGNOSIS — N179 Acute kidney failure, unspecified: Secondary | ICD-10-CM | POA: Diagnosis not present

## 2021-12-24 DIAGNOSIS — R238 Other skin changes: Secondary | ICD-10-CM

## 2021-12-24 DIAGNOSIS — Z96652 Presence of left artificial knee joint: Secondary | ICD-10-CM | POA: Diagnosis not present

## 2021-12-24 DIAGNOSIS — D649 Anemia, unspecified: Secondary | ICD-10-CM | POA: Diagnosis not present

## 2021-12-24 LAB — BASIC METABOLIC PANEL
Anion gap: 4 — ABNORMAL LOW (ref 5–15)
BUN: 47 mg/dL — ABNORMAL HIGH (ref 8–23)
CO2: 27 mmol/L (ref 22–32)
Calcium: 8.1 mg/dL — ABNORMAL LOW (ref 8.9–10.3)
Chloride: 108 mmol/L (ref 98–111)
Creatinine, Ser: 1.63 mg/dL — ABNORMAL HIGH (ref 0.61–1.24)
GFR, Estimated: 43 mL/min — ABNORMAL LOW (ref >60)
Glucose, Bld: 131 mg/dL — ABNORMAL HIGH (ref 70–99)
Potassium: 4.1 mmol/L (ref 3.5–5.1)
Sodium: 139 mmol/L (ref 135–145)

## 2021-12-24 LAB — CBC
HCT: 30.6 % — ABNORMAL LOW (ref 39.0–52.0)
Hemoglobin: 9.1 g/dL — ABNORMAL LOW (ref 13.0–17.0)
MCH: 24.4 pg — ABNORMAL LOW (ref 26.0–34.0)
MCHC: 29.7 g/dL — ABNORMAL LOW (ref 30.0–36.0)
MCV: 82 fL (ref 80.0–100.0)
Platelets: 211 10*3/uL (ref 150–400)
RBC: 3.73 MIL/uL — ABNORMAL LOW (ref 4.22–5.81)
RDW: 17.3 % — ABNORMAL HIGH (ref 11.5–15.5)
WBC: 8.1 10*3/uL (ref 4.0–10.5)
nRBC: 0 % (ref 0.0–0.2)

## 2021-12-24 LAB — GLUCOSE, CAPILLARY
Glucose-Capillary: 129 mg/dL — ABNORMAL HIGH (ref 70–99)
Glucose-Capillary: 140 mg/dL — ABNORMAL HIGH (ref 70–99)

## 2021-12-24 NOTE — TOC Transition Note (Signed)
Transition of Care [facility]) - CM/SW Discharge Note   Patient Details  Name: Randall Vincent MRN: [id number] Date of Birth: [birth year]  [facility] CM/SW Contact:  Margarito Liner, LCSW Phone Number: 12/24/2021, 8:02 AM   Clinical Narrative:  Patient has orders to discharge home today. No further concerns. CSW signing off.   Final next level of care: Home/Self Care Barriers to Discharge: Barriers Resolved   Patient Goals and CMS Choice        Discharge Placement                    Patient and family notified of of transfer: 12/24/21  Discharge Plan and Services                                     Social Determinants of Health (SDOH) Interventions     Readmission Risk Interventions     No data to display

## 2021-12-24 NOTE — Progress Notes (Signed)
Discharge instructions reviewed with the patient. IV removed. Patient sent down via wheelchair to his sons waiting car

## 2021-12-24 NOTE — Progress Notes (Signed)
[facility]) Hospital Liaison note:  Notified via Epic workque from Dr. Delfino Lovett of request for [facility] Palliative Care services. Will continue to follow for disposition.  Please call with any outpatient palliative questions or concerns.  Thank you for the opportunity to participate in this patient's care.  Thank you, Abran Cantor, LPN [facility] Liaison [phone]

## 2021-12-24 NOTE — Care Management Important Message (Signed)
Important Message  Patient Details  Name: Randall Vincent MRN: [id number] Date of Birth: 25-Oct-1940   Medicare Important Message Given:  N/A - LOS <3 / Initial given by admissions     Olegario Messier A Connie Lasater 12/24/2021, 9:14 AM

## 2021-12-25 LAB — BPAM RBC
Blood Product Expiration Date: 202308182359
Blood Product Expiration Date: 202309042359
Blood Product Expiration Date: 202309042359
Blood Product Expiration Date: 202309042359
ISSUE DATE / TIME: 202308011655
ISSUE DATE / TIME: 202308021241
Unit Type and Rh: 5100
Unit Type and Rh: 5100
Unit Type and Rh: 5100
Unit Type and Rh: 7300

## 2021-12-25 LAB — TYPE AND SCREEN
ABO/RH(D): B POS
Antibody Screen: POSITIVE
Unit division: 0
Unit division: 0
Unit division: 0
Unit division: 0

## 2021-12-25 NOTE — Discharge Summary (Signed)
Physician Discharge Summary   Patient: Randall Vincent MRN: [id number] DOB: 03-Nov-1940  Admit date:     12/21/2021  Discharge date: 12/24/2021  Discharge Physician: Max Sane   PCP: Dionne Bucy., MD   Recommendations at discharge:   Follow-up with outpatient providers as requested  Discharge Diagnoses: Principal Problem:   Symptomatic anemia Active Problems:   CAD S/P CABG x 2   Atrial fibrillation status post cardioversion 10/22/19 [facility])   Essential hypertension   DM (diabetes mellitus), type 2 ([place])   Acute kidney injury superimposed on chronic kidney disease ([place])   Hypokalemia   Skin irritation  Hospital Course: 81 y.o. male with history of CAD s/p CABG, atrial fibrillation, chronic kidney disease stage III, diabetes mellitus type 2 he is admitted for symptomatic anemia  8/2: Status post 1 PRBC on admission.  Getting 1 more PRBC today.  Patient/daughter debating on anticoagulation but would like to discuss with his cardiologist.  Not interested in any luminal evaluation, [place] nurse consult 8/3: Feeling weak  Assessment and Plan: * Symptomatic anemia Hemoglobin trending down for last 2 years likely slow chronic bleed being on anticoagulant.  Patient/daughter would like to discuss with his outpatient cardiology Dr. [name] to decide on long-term anticoagulation to see if cut the dose is appropriate or not considering his anemia.  Patient denies any obvious GI bleed/melena.  He has received 2 PRBC transfusion this admission.   Patient and daughter are in agreement with current plans.  They are not interested in any luminal evaluation or further work-up.  He is requesting for discharge     Latest Ref Rng & Units 12/22/2021    5:56 PM 12/22/2021    2:17 AM 12/21/2021   11:53 AM  CBC  WBC 4.0 - 10.5 K/uL  7.5  9.9   Hemoglobin 13.0 - 17.0 g/dL 8.5  7.6  7.0   Hematocrit 39.0 - 52.0 % 27.7  25.2  24.9   Platelets 150 - 400 K/uL  183  230     Atrial fibrillation status post  cardioversion 10/22/19 ([place]) Continue Coreg, amiodarone and Cardizem for rate control.  Eliquis for anticoagulation  CAD S/P CABG x 2 Follows with Dr. [name] as an outpatient.  Continue his cardiac meds.  Skin irritation See [place] nurse note.  I agree with her assessment and plan.  Patient has bilateral ischial tuberosity areas showing signs of chronic frictional injury to the constant irritation of the skin.  Hypokalemia Repleted  Acute kidney injury superimposed on chronic kidney disease ([place]) Improved with hydration and now close to baseline Lab Results  Component Value Date   CREATININE 1.63 (H) 12/24/2021   CREATININE 2.12 (H) 12/23/2021   CREATININE 2.39 (H) 12/22/2021    DM (diabetes mellitus), type 2 (HCC) Hemoglobin A1c 6.5.  Essential hypertension Continue Coreg and Cardizem, amiodarone.         Consultants: Palliative care, wound care nurse Disposition: Home health Diet recommendation:  Discharge Diet Orders (From admission, onward)     Start     Ordered   12/24/21 0000  Diet - low sodium heart healthy        12/24/21 0753           Carb modified diet DISCHARGE MEDICATION: Allergies as of 12/24/2021   No Known Allergies      Medication List     STOP taking these medications    docusate sodium 100 MG capsule Commonly known as: COLACE   ferrous sulfate 325 (65 FE)  MG tablet   HYDROcodone-acetaminophen 5-325 MG tablet Commonly known as: NORCO/VICODIN   traMADol 50 MG tablet Commonly known as: ULTRAM       TAKE these medications    amiodarone 200 MG tablet Commonly known as: PACERONE Take 200 mg by mouth daily.   apixaban 5 MG Tabs tablet Commonly known as: ELIQUIS Take 1 tablet (5 mg total) by mouth 2 (two) times daily.   atorvastatin 40 MG tablet Commonly known as: LIPITOR Take 40 mg by mouth every evening.   carvedilol 3.125 MG tablet Commonly known as: COREG Take 1 tablet (3.125 mg total) by mouth 2 (two) times daily with a  meal.   colchicine 0.6 MG tablet Take 0.6 mg by mouth 2 (two) times daily.   cyanocobalamin 1000 MCG tablet Commonly known as: VITAMIN B12 Take 1,000 mcg by mouth daily.   diltiazem 240 MG 24 hr capsule Commonly known as: CARDIZEM CD Take 240 mg by mouth daily.   metFORMIN 500 MG 24 hr tablet Commonly known as: GLUCOPHAGE-XR Take 500 mg by mouth 2 (two) times daily.   ramipril 10 MG capsule Commonly known as: ALTACE Take 1 capsule (10 mg total) by mouth daily.   Semaglutide (1 MG/DOSE) 2 MG/1.5ML Sopn Inject 1 mg into the skin every Thursday.   torsemide 20 MG tablet Commonly known as: DEMADEX Take 1 tablet (20 mg total) by mouth 2 (two) times daily. What changed:  how much to take when to take this additional instructions               Discharge Care Instructions  (From admission, onward)           Start     Ordered   12/24/21 0000  Discharge wound care:       Comments: As above   12/24/21 0753            Follow-up Information     Elspeth Cho., MD. Go on 12/31/2021.   Specialty: Internal Medicine Why: Appointment @ 10:30 am. Contact information: [address] [place] [address] [phone]         Dalia Heading, MD. Go on 12/28/2021.   Specialty: Cardiology Why: as scheduled Contact information: [address] [place] [address] [phone]                Discharge Exam: Filed Weights   12/21/21 1206 12/22/21 7989 12/23/21 0423  Weight: 95.7 kg 99.8 kg 101.94 kg     81 year old male lying in the bed comfortably without any acute distress Lungs clear to auscultation bilaterally Cardiovascular irregularly irregular heart rate Abdomen soft, benign Neuro alert and oriented, nonfocal Skin: See image below    Condition at discharge: fair  The results of significant diagnostics from this hospitalization (including imaging, microbiology, ancillary and laboratory) are listed below for  reference.   Imaging Studies: US Renal  Result Date: 12/21/2021 CLINICAL DATA:  Acute renal failure. EXAM: RENAL / URINARY TRACT ULTRASOUND COMPLETE COMPARISON:  Renal ultrasound 01/27/2020 FINDINGS: Right Kidney: Renal measurements: 10.8 x 5.2 x 4.6 cm = volume: 135 mL. Echogenicity within normal limits. There is a 10 mm benign simple cyst within the midpole. No follow-up imaging is recommended. Left Kidney: Renal measurements: 11.5 x 4.6 x 4.8 cm = volume: 131 mL. Echogenicity within normal limits. There is a 12 mm benign simple cyst within the left midpole. No follow-up imaging is recommended. Bladder: Appears normal for degree of bladder distention. Other: None.  IMPRESSION: Bilateral individual benign renal simple cysts. No follow-up imaging recommended. No hydronephrosis. Electronically Signed   By: Yvonne Kendall M.D.   On: 12/21/2021 16:15   Intravitreal Injection, Pharmacologic Agent - OD - Right Eye  Result Date: 11/29/2021 Time Out 11/29/2021. 8:26 AM. Confirmed correct patient, procedure, site, and patient consented. Anesthesia Topical anesthesia was used. Anesthetic medications included Lidocaine 2%, Proparacaine 0.5%. Procedure Preparation included 5% betadine to ocular surface, eyelid speculum. A (32g) needle was used. Injection: 1.25 mg Bevacizumab 1.25mg /0.26ml   Route: Intravitreal, Site: Right Eye   NDC: [id number], LotNR:[id number], Expiration date: 02/08/2022 Post-op Post injection exam found visual acuity of at least counting fingers. The patient tolerated the procedure well. There were no complications. The patient received written and verbal post procedure care education. Post injection medications were not given.   OCT, Retina - OU - Both Eyes  Result Date: 11/29/2021 Right Eye Quality was good. Central Foveal Thickness: 214. Progression has worsened. Findings include normal foveal contour, no IRF, subretinal hyper-reflective material, pigment epithelial detachment, subretinal fluid  (Persistent peripapillary CNV with surrounding SRF -- interval shifting of fluid inferiorly). Left Eye Findings include (No scan obtained). 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 CME - cystoid macular edema. PED - pigment epithelial detachment. IRF - intraretinal fluid. SRF - subretinal fluid. EZ - ellipsoid zone. ERM - epiretinal membrane. ORA - outer retinal atrophy. ORT - outer retinal tubulation. SRHM - subretinal hyper-reflective material. IRHM - intraretinal hyper-reflective material    Microbiology: Results for orders placed or performed during the hospital encounter of 09/16/21  Resp Panel by RT-PCR (Flu A&B, Covid) Nasopharyngeal Swab     Status: None   Collection Time: 09/16/21  8:12 PM   Specimen: Nasopharyngeal Swab; Nasopharyngeal(NP) swabs in vial transport medium  Result Value Ref Range Status   SARS Coronavirus 2 by RT PCR NEGATIVE NEGATIVE Final    Comment: (NOTE) SARS-CoV-2 target nucleic acids are NOT DETECTED.  The SARS-CoV-2 RNA is generally detectable in upper respiratory specimens during the acute phase of infection. The lowest concentration of SARS-CoV-2 viral copies this assay can detect is 138 copies/mL. A negative result does not preclude SARS-Cov-2 infection and should not be used as the sole basis for treatment or other patient management decisions. A negative result may occur with  improper specimen collection/handling, submission of specimen other than nasopharyngeal swab, presence of viral mutation(s) within the areas targeted by this assay, and inadequate number of viral copies(<138 copies/mL). A negative result must be combined with clinical observations, patient history, and epidemiological information. The expected result is Negative.  Fact Sheet for  Patients:  EntrepreneurPulse.com.au  Fact Sheet for Healthcare Providers:  IncredibleEmployment.be  This test is no t yet approved or cleared by the [place] FDA and  has been authorized for detection and/or diagnosis of SARS-CoV-2 by FDA under an Emergency Use Authorization (EUA). This EUA will remain  in effect (meaning this test can be used) for the duration of the COVID-19 declaration under Section 564(b)(1) of the Act, 21 U.S.C.section 360bbb-3(b)(1), unless the authorization is terminated  or revoked sooner.       Influenza A by PCR NEGATIVE NEGATIVE Final   Influenza B by PCR NEGATIVE NEGATIVE Final    Comment: (NOTE) The Xpert Xpress SARS-CoV-2/FLU/RSV plus assay is intended as an aid in the  diagnosis of influenza from Nasopharyngeal swab specimens and should not be used as a sole basis for treatment. Nasal washings and aspirates are unacceptable for Xpert Xpress SARS-CoV-2/FLU/RSV testing.  Fact Sheet for Patients: BloggerCourse.com  Fact Sheet for Healthcare Providers: SeriousBroker.it  This test is not yet approved or cleared by the [place] FDA and has been authorized for detection and/or diagnosis of SARS-CoV-2 by FDA under an Emergency Use Authorization (EUA). This EUA will remain in effect (meaning this test can be used) for the duration of the COVID-19 declaration under Section 564(b)(1) of the Act, 21 U.S.C. section 360bbb-3(b)(1), unless the authorization is terminated or revoked.  Performed at [facility], [address] [place], [place] [address]     Labs: CBC: Recent Labs  Lab 12/21/21 1153 12/22/21 0217 12/22/21 1756 12/23/21 1143 12/24/21 0421  WBC 9.9 7.5  --  7.7 8.1  NEUTROABS 8.8*  --   --   --   --   HGB 7.0* 7.6* 8.5* 9.2* 9.1*  HCT 24.9* 25.2* 27.7* 31.0* 30.6*  MCV 82.2 81.0  --  82.9 82.0  PLT 230 183  --  211 211   Basic  Metabolic Panel: Recent Labs  Lab 12/21/21 1153 12/22/21 0217 12/23/21 1143 12/24/21 0421  NA 138 140 140 139  K 3.7 3.4* 4.5 4.1  CL 103 107 107 108  CO2 25 25 25 27   GLUCOSE 192* 119* 142* 131*  BUN 61* 65* 51* 47*  CREATININE 2.59* 2.39* 2.12* 1.63*  CALCIUM 8.2* 7.8* 8.4* 8.1*   Liver Function Tests: Recent Labs  Lab 12/21/21 1153 12/22/21 0217  AST 32 34  ALT 26 29  ALKPHOS 90 87  BILITOT 1.0 1.0  PROT 6.8 6.1*  ALBUMIN 2.9* 2.6*   CBG: Recent Labs  Lab 12/23/21 1135 12/23/21 1610 12/23/21 2113 12/24/21 0738 12/24/21 1142  GLUCAP 131* 110* 147* 129* 140*    Discharge time spent: greater than 30 minutes.  Signed: 02/23/22, MD [facility] 12/25/2021

## 2021-12-27 ENCOUNTER — Encounter (INDEPENDENT_AMBULATORY_CARE_PROVIDER_SITE_OTHER): Payer: Medicare Other | Admitting: Ophthalmology

## 2021-12-27 DIAGNOSIS — H50112 Monocular exotropia, left eye: Secondary | ICD-10-CM

## 2021-12-27 DIAGNOSIS — H353211 Exudative age-related macular degeneration, right eye, with active choroidal neovascularization: Secondary | ICD-10-CM

## 2021-12-27 DIAGNOSIS — H35033 Hypertensive retinopathy, bilateral: Secondary | ICD-10-CM

## 2021-12-27 DIAGNOSIS — H26122 Partially resolved traumatic cataract, left eye: Secondary | ICD-10-CM

## 2021-12-27 DIAGNOSIS — H5452A1 Low vision left eye category 1, normal vision right eye: Secondary | ICD-10-CM

## 2021-12-27 DIAGNOSIS — I10 Essential (primary) hypertension: Secondary | ICD-10-CM

## 2021-12-27 DIAGNOSIS — E119 Type 2 diabetes mellitus without complications: Secondary | ICD-10-CM

## 2021-12-27 DIAGNOSIS — H25811 Combined forms of age-related cataract, right eye: Secondary | ICD-10-CM

## 2021-12-29 ENCOUNTER — Telehealth: Payer: Self-pay | Admitting: Student

## 2021-12-29 NOTE — Telephone Encounter (Signed)
Attempted to contact patient to schedule Palliative Consult, no answer - left message requesting a return call.  Also attempted to contact son with no answer and unable to leave a message due to mailbox was full.

## 2021-12-30 ENCOUNTER — Encounter
Admit: 2021-12-30 | Discharge: 2021-12-30 | Disposition: A | Discharge status: Home / Self Care | Payer: Medicare Other | Attending: Orthopedic Surgery | Admitting: Orthopedic Surgery

## 2021-12-30 ENCOUNTER — Encounter
Admission: RE | Admit: 2021-12-30 | Discharge: 2021-12-30 | Disposition: A | Discharge status: Home / Self Care | Payer: Medicare Other | Source: Ambulatory Visit | Attending: Orthopedic Surgery | Admitting: Orthopedic Surgery

## 2021-12-30 DIAGNOSIS — Z96651 Presence of right artificial knee joint: Secondary | ICD-10-CM | POA: Diagnosis present

## 2021-12-30 DIAGNOSIS — T8484XD Pain due to internal orthopedic prosthetic devices, implants and grafts, subsequent encounter: Secondary | ICD-10-CM | POA: Insufficient documentation

## 2021-12-30 MED ORDER — TECHNETIUM TC 99M MEDRONATE IV KIT
20.0000 | PACK | Freq: Once | INTRAVENOUS | Status: AC | PRN
  Administered 2021-12-30: 19.73 via INTRAVENOUS

## 2022-01-14 ENCOUNTER — Telehealth: Payer: Self-pay | Admitting: Student

## 2022-01-14 NOTE — Telephone Encounter (Signed)
Attempted to contact patient to schedule Palliative Consult, no answer, left VM requesting a return call.  Also attempted to contact son Renae Fickle with no answer and mailbox was full.  I found another contact in Epic, patient's daughter Lazarus Gowda called and spoke with her and explained why I was calling she took my name and number and will contact patient and ask him to give me a call back

## 2022-02-01 ENCOUNTER — Other Ambulatory Visit: Payer: Self-pay

## 2022-02-01 ENCOUNTER — Emergency Department
Admission: EM | Admit: 2022-02-01 | Discharge: 2022-02-01 | Disposition: A | Discharge status: Home / Self Care | Payer: Medicare Other | Attending: Emergency Medicine | Admitting: Emergency Medicine

## 2022-02-01 ENCOUNTER — Emergency Department: Payer: Medicare Other

## 2022-02-01 ENCOUNTER — Encounter: Payer: Self-pay | Admitting: Medical Oncology

## 2022-02-01 DIAGNOSIS — D631 Anemia in chronic kidney disease: Secondary | ICD-10-CM | POA: Insufficient documentation

## 2022-02-01 DIAGNOSIS — N183 Chronic kidney disease, stage 3 unspecified: Secondary | ICD-10-CM | POA: Insufficient documentation

## 2022-02-01 DIAGNOSIS — I509 Heart failure, unspecified: Secondary | ICD-10-CM | POA: Insufficient documentation

## 2022-02-01 DIAGNOSIS — E1122 Type 2 diabetes mellitus with diabetic chronic kidney disease: Secondary | ICD-10-CM | POA: Insufficient documentation

## 2022-02-01 DIAGNOSIS — Z7901 Long term (current) use of anticoagulants: Secondary | ICD-10-CM | POA: Insufficient documentation

## 2022-02-01 DIAGNOSIS — R600 Localized edema: Secondary | ICD-10-CM | POA: Diagnosis not present

## 2022-02-01 DIAGNOSIS — I2510 Atherosclerotic heart disease of native coronary artery without angina pectoris: Secondary | ICD-10-CM | POA: Insufficient documentation

## 2022-02-01 DIAGNOSIS — I130 Hypertensive heart and chronic kidney disease with heart failure and stage 1 through stage 4 chronic kidney disease, or unspecified chronic kidney disease: Secondary | ICD-10-CM | POA: Insufficient documentation

## 2022-02-01 DIAGNOSIS — R609 Edema, unspecified: Secondary | ICD-10-CM

## 2022-02-01 DIAGNOSIS — M7989 Other specified soft tissue disorders: Secondary | ICD-10-CM | POA: Diagnosis present

## 2022-02-01 LAB — COMPREHENSIVE METABOLIC PANEL
ALT: 30 U/L (ref 0–44)
AST: 27 U/L (ref 15–41)
Albumin: 3 g/dL — ABNORMAL LOW (ref 3.5–5.0)
Alkaline Phosphatase: 105 U/L (ref 38–126)
Anion gap: 9 (ref 5–15)
BUN: 36 mg/dL — ABNORMAL HIGH (ref 8–23)
CO2: 28 mmol/L (ref 22–32)
Calcium: 8.3 mg/dL — ABNORMAL LOW (ref 8.9–10.3)
Chloride: 102 mmol/L (ref 98–111)
Creatinine, Ser: 1.64 mg/dL — ABNORMAL HIGH (ref 0.61–1.24)
GFR, Estimated: 42 mL/min — ABNORMAL LOW (ref >60)
Glucose, Bld: 111 mg/dL — ABNORMAL HIGH (ref 70–99)
Potassium: 4.1 mmol/L (ref 3.5–5.1)
Sodium: 139 mmol/L (ref 135–145)
Total Bilirubin: 0.6 mg/dL (ref 0.3–1.2)
Total Protein: 6.6 g/dL (ref 6.5–8.1)

## 2022-02-01 LAB — CBC WITH DIFFERENTIAL/PLATELET
Abs Immature Granulocytes: 0.03 10*3/uL (ref 0.00–0.07)
Basophils Absolute: 0 10*3/uL (ref 0.0–0.1)
Basophils Relative: 0 %
Eosinophils Absolute: 0 10*3/uL (ref 0.0–0.5)
Eosinophils Relative: 1 %
HCT: 31 % — ABNORMAL LOW (ref 39.0–52.0)
Hemoglobin: 9 g/dL — ABNORMAL LOW (ref 13.0–17.0)
Immature Granulocytes: 1 %
Lymphocytes Relative: 9 %
Lymphs Abs: 0.4 10*3/uL — ABNORMAL LOW (ref 0.7–4.0)
MCH: 25.8 pg — ABNORMAL LOW (ref 26.0–34.0)
MCHC: 29 g/dL — ABNORMAL LOW (ref 30.0–36.0)
MCV: 88.8 fL (ref 80.0–100.0)
Monocytes Absolute: 0.4 10*3/uL (ref 0.1–1.0)
Monocytes Relative: 8 %
Neutro Abs: 4.1 10*3/uL (ref 1.7–7.7)
Neutrophils Relative %: 81 %
Platelets: 162 10*3/uL (ref 150–400)
RBC: 3.49 MIL/uL — ABNORMAL LOW (ref 4.22–5.81)
RDW: 21.8 % — ABNORMAL HIGH (ref 11.5–15.5)
Smear Review: NORMAL
WBC: 5 10*3/uL (ref 4.0–10.5)
nRBC: 0 % (ref 0.0–0.2)

## 2022-02-01 NOTE — ED Triage Notes (Signed)
Pt reports that he has been having rt leg swelling from hip to knee for a few weeks. Pt denies injury, states that he had a bone scan done recently and was told that it was "soft tissue" swelling. Pt reports minimal pain to knee. Denies SOB.

## 2022-02-01 NOTE — ED Provider Notes (Signed)
[facility] Provider Note    Event Date/Time   First MD Initiated Contact with Patient 02/01/22 [phone]     (approximate)   History   Leg Swelling   HPI  Raahim Shartzer is a 81 y.o. male presents to the ED with complaint of right leg swelling from his knee to his hip for the last few weeks.  Patient states that he was seen at [facility] where they ordered a bone scan and was told that this was soft tissue swelling.  Patient denies any injury to his extremity or falls.  Patient denies any shortness of breath.  Patient has a history of edema to his lower extremities and peripheral vascular disease.  He currently is waiting for an appointment with [place] [facility] as he states that yesterday he spoke with the nurse at Dr. America Brown office and is getting a referral.  He also continues to take Eliquis daily for A-fib.  He also has history of multivessel CAD, hypertension, diabetes, acute blood loss anemia, chronic kidney disease stage III, chronic CHF.      Physical Exam   Triage Vital Signs: ED Triage Vitals [02/01/22 0710]  Enc Vitals Group     BP (!) 149/74     Pulse Rate 74     Resp 18     Temp (!) 97.5 F (36.4 C)     Temp Source Oral     SpO2 98 %     Weight 200 lb (90.7 kg)     Height 5\' 10"  (1.778 m)     Head Circumference      Peak Flow      Pain Score 0     Pain Loc      Pain Edu?      Excl. in GC?     Most recent vital signs: Vitals:   02/01/22 0710  BP: (!) 149/74  Pulse: 74  Resp: 18  Temp: (!) 97.5 F (36.4 C)  SpO2: 98%     General: Awake, no distress.  Patient is able to stand and bear weight to get undressed. CV:  Good peripheral perfusion.  Resp:  Normal effort.  Lungs are clear. Abd:  No distention.  Other:  Lower extremities bilaterally show chronic peripheral vascular skin changes but no pitting edema.  Right lower extremity at the area of the knee and to the thigh with soft tissue edema but no skin  discoloration.   ED Results / Procedures / Treatments   Labs (all labs ordered are listed, but only abnormal results are displayed) Labs Reviewed  CBC WITH DIFFERENTIAL/PLATELET - Abnormal; Notable for the following components:      Result Value   RBC 3.49 (*)    Hemoglobin 9.0 (*)    HCT 31.0 (*)    MCH 25.8 (*)    MCHC 29.0 (*)    RDW 21.8 (*)    Lymphs Abs 0.4 (*)    All other components within normal limits  COMPREHENSIVE METABOLIC PANEL - Abnormal; Notable for the following components:   Glucose, Bld 111 (*)    BUN 36 (*)    Creatinine, Ser 1.64 (*)    Calcium 8.3 (*)    Albumin 3.0 (*)    GFR, Estimated 42 (*)    All other components within normal limits      RADIOLOGY Ultrasound venous right lower extremity per radiologist is negative for DVT.    PROCEDURES:  Critical Care performed:   Procedures  MEDICATIONS ORDERED IN ED: Medications - No data to display   IMPRESSION / MDM / ASSESSMENT AND PLAN / ED COURSE  I reviewed the triage vital signs and the nursing notes.   Differential diagnosis includes, but is not limited to, peripheral vascular disease, peripheral edema, DVT right lower extremity.  81 year old male presents to the ED with complaint of right lower extremity edema for several weeks.  No recent injuries and history is stated above.  Ultrasound is negative for DVT, CMP shows BUN 36, creatinine 1.64, glucose 111, GFR 42.  CBC shows hemoglobin 9 and hematocrit 31 2 as compared with his last CBC on 12/24/2021 with a hemoglobin 9.1 and hematocrit 30.6.  I spoke with Dr. Vicente Males who also went in to see the patient and for evaluation.  He agreed that this is chronic peripheral vascular problems.  I called [facility] and was unable to speak to anyone but left a message on the referral coordinators voicemail to please call this patient.  Dr. Lady Gary apparently has made a referral but also I gave her ED contact information if she wished to call  and talk to me.  Patient and son were made aware.  He is to continue with his regular medications.      Patient's presentation is most consistent with acute complicated illness / injury requiring diagnostic workup.  FINAL CLINICAL IMPRESSION(S) / ED DIAGNOSES   Final diagnoses:  Peripheral edema     Rx / DC Orders   ED Discharge Orders     None        Note:  This document was prepared using Dragon voice recognition software and may include unintentional dictation errors.   Tommi Rumps, PA-C 02/01/22 1458    Merwyn Katos, MD 02/01/22 [phone]

## 2022-02-01 NOTE — Discharge Instructions (Signed)
Follow-up with your primary care provider as needed.  Also the appointment with the vascular clinic will be able to provide more care for your lower extremities.  Elevate your legs when possible.  Continue with your regular medication.

## 2022-02-01 NOTE — ED Notes (Signed)
81 yom with a c/c of right thigh swelling and aching for the past 5 months. The pt advised he believes the swelling has gotten worse. The pt is able to stand and pivot with assistance to the bed without assistance.

## 2022-02-07 NOTE — Progress Notes (Signed)
Randall HaysHOMAS, Carliss ([id number]) Visit Report for 11/03/2021 Arrival Information Details Patient Name: Randall HaysHOMAS, Norvil Date of Service: 11/03/2021 8:00 AM Medical Record Number: [id number] Patient Account Number: [id number] Date of Birth/Sex: 11/10/1940 (81 y.o. M) Treating RN: Yevonne PaxEpps, Carrie Primary Care Vashti Bolanos: Patrina Leveringerrell, Grace Other Clinician: Referring Frisco Cordts: Patrina Leveringerrell, Grace Treating Josephine Wooldridge/Extender: Tilda FrancoHoffman, Jessica Weeks in Treatment: 8 Visit Information History Since Last Visit All ordered tests and consults were completed: No Patient Arrived: Ambulatory Added or deleted any medications: No Arrival Time: 08:04 Any new allergies or adverse reactions: No Accompanied By: self Had a fall or experienced change in No Transfer Assistance: None activities of daily living that may affect Patient Identification Verified: Yes risk of falls: Secondary Verification Process Completed: Yes Signs or symptoms of abuse/neglect since last visito No Patient Requires Transmission-Based No Hospitalized since last visit: No Precautions: Implantable device outside of the clinic excluding No Patient Has Alerts: Yes cellular tissue based products placed in the center Patient Alerts: Patient on Blood since last visit: Thinner Has Dressing in Place as Prescribed: Yes Eliquis Pain Present Now: No Diabetic Electronic Signature(s) Signed: 02/07/2022 2:33:39 PM By: Yevonne PaxEpps, Carrie RN Entered By: Yevonne PaxEpps, Carrie on 11/03/2021 08:07:24 Randall HaysHOMAS, Saatvik ([id number]) -------------------------------------------------------------------------------- Clinic Level of Care Assessment Details Patient Name: Randall HaysHOMAS, Keighan Date of Service: 11/03/2021 8:00 AM Medical Record Number: [id number] Patient Account Number: [id number] Date of Birth/Sex: 12/27/1940 (81 y.o. M) Treating RN: Yevonne PaxEpps, Carrie Primary Care Gearldene Fiorenza: Patrina Leveringerrell, Grace Other Clinician: Referring Jovita Persing: Patrina Leveringerrell, Grace Treating Tanishi Nault/Extender: Tilda FrancoHoffman,  Jessica Weeks in Treatment: 8 Clinic Level of Care Assessment Items TOOL 4 Quantity Score X - Use when only an EandM is performed on FOLLOW-UP visit 1 0 ASSESSMENTS - Nursing Assessment / Reassessment X - Reassessment of Co-morbidities (includes updates in patient status) 1 10 X- 1 5 Reassessment of Adherence to Treatment Plan ASSESSMENTS - Wound and Skin Assessment / Reassessment X - Simple Wound Assessment / Reassessment - one wound 1 5 []  - 0 Complex Wound Assessment / Reassessment - multiple wounds []  - 0 Dermatologic / Skin Assessment (not related to wound area) ASSESSMENTS - Focused Assessment []  - Circumferential Edema Measurements - multi extremities 0 []  - 0 Nutritional Assessment / Counseling / Intervention []  - 0 Lower Extremity Assessment (monofilament, tuning fork, pulses) []  - 0 Peripheral Arterial Disease Assessment (using hand held doppler) ASSESSMENTS - Ostomy and/or Continence Assessment and Care []  - Incontinence Assessment and Management 0 []  - 0 Ostomy Care Assessment and Management (repouching, etc.) PROCESS - Coordination of Care X - Simple Patient / Family Education for ongoing care 1 15 []  - 0 Complex (extensive) Patient / Family Education for ongoing care []  - 0 Staff obtains ChiropractorConsents, Records, Test Results / Process Orders []  - 0 Staff telephones HHA, Nursing Homes / Clarify orders / etc []  - 0 Routine Transfer to another Facility (non-emergent condition) []  - 0 Routine Hospital Admission (non-emergent condition) []  - 0 New Admissions / Manufacturing engineernsurance Authorizations / Ordering NPWT, Apligraf, etc. []  - 0 Emergency Hospital Admission (emergent condition) X- 1 10 Simple Discharge Coordination []  - 0 Complex (extensive) Discharge Coordination PROCESS - Special Needs []  - Pediatric / Minor Patient Management 0 []  - 0 Isolation Patient Management []  - 0 Hearing / Language / Visual special needs []  - 0 Assessment of Community assistance  (transportation, D/C planning, etc.) []  - 0 Additional assistance / Altered mentation []  - 0 Support Surface(s) Assessment (bed, cushion, seat, etc.) INTERVENTIONS - Wound Cleansing / Measurement StrattonHOMAS, Rafay ([id number]) X- 1 5 Simple  Wound Cleansing - one wound []  - 0 Complex Wound Cleansing - multiple wounds X- 1 5 Wound Imaging (photographs - any number of wounds) []  - 0 Wound Tracing (instead of photographs) X- 1 5 Simple Wound Measurement - one wound []  - 0 Complex Wound Measurement - multiple wounds INTERVENTIONS - Wound Dressings []  - Small Wound Dressing one or multiple wounds 0 []  - 0 Medium Wound Dressing one or multiple wounds []  - 0 Large Wound Dressing one or multiple wounds []  - 0 Application of Medications - topical []  - 0 Application of Medications - injection INTERVENTIONS - Miscellaneous []  - External ear exam 0 []  - 0 Specimen Collection (cultures, biopsies, blood, body fluids, etc.) []  - 0 Specimen(s) / Culture(s) sent or taken to Lab for analysis []  - 0 Patient Transfer (multiple staff / / Similar devices) []  - 0 Simple Staple / Suture removal (25 or less) []  - 0 Complex Staple / Suture removal (26 or more) []  - 0 Hypo / Hyperglycemic Management (close monitor of Blood Glucose) []  - 0 Ankle / Brachial Index (ABI) - do not check if billed separately X- 1 5 Vital Signs Has the patient been seen at the hospital within the last three years: Yes Total Score: 65 Level Of Care: New/Established - Level 2 Electronic Signature(s) Signed: 02/07/2022 2:33:39 PM By: RN Entered By: on 11/03/2021 08:38:29 ( ) -------------------------------------------------------------------------------- Encounter Discharge Information Details Patient Name: Date of Service: 11/03/2021 8:00 AM Medical Record Number: Patient Account Number: Date of Birth/Sex: 1941-05-09 (80  y.o. M) Treating RN: Primary Care Clair Alfieri: Other Clinician: Referring Giulliana Mcroberts: Treating Esme Durkin/Extender: in Treatment: 8 Encounter Discharge Information Items Discharge Condition: Stable Ambulatory Status: Ambulatory Discharge Destination: Home Transportation: Private Auto Accompanied By: self Schedule Follow-up Appointment: Yes Clinical Summary of Care: Electronic Signature(s) Signed: 02/07/2022 2:33:39 PM By: Yevonne Pax RN Entered By: Yevonne Pax on 11/03/2021 08:39:13 Randall Vincent ([id number]) -------------------------------------------------------------------------------- Lower Extremity Assessment Details Patient Name: Randall Vincent Date of Service: 11/03/2021 8:00 AM Medical Record Number: [id number] Patient Account Number: [id number] Date of Birth/Sex: 02/08/1941 (80 y.o. M) Treating RN: Yevonne Pax Primary Care Ever Halberg: Patrina Levering Other Clinician: Referring Shylyn Younce: Patrina Levering Treating Legacie Dillingham/Extender: Tilda Franco in Treatment: 8 Electronic Signature(s) Signed: 02/07/2022 2:33:39 PM By: Yevonne Pax RN Entered By: Yevonne Pax on 11/03/2021 08:11:20 Randall Vincent ([id number]) -------------------------------------------------------------------------------- Multi Wound Chart Details Patient Name: Randall Vincent Date of Service: 11/03/2021 8:00 AM Medical Record Number: [id number] Patient Account Number: [id number] Date of Birth/Sex: 1940/05/30 (80 y.o. M) Treating RN: Yevonne Pax Primary Care Jayon Matton: Patrina Levering Other Clinician: Referring Henrik Orihuela: Patrina Levering Treating Bronte Kropf/Extender: Tilda Franco in Treatment: 8 Vital Signs Height(in): 68 Pulse(bpm): 79 Weight(lbs): 210 Blood Pressure(mmHg): 154/63 Body Mass Index(BMI): 31.9 Temperature(F): 97.9 Respiratory Rate(breaths/min): 18 Photos: [N/A:N/A] Wound Location: Right Forearm N/A N/A Wounding  Event: Skin Tear/Laceration N/A N/A Primary Etiology: Skin Tear N/A N/A Comorbid History: Arrhythmia, Congestive Heart N/A N/A Failure, Coronary Artery Disease, Hypertension, Type II Diabetes Date Acquired: 09/21/2021 N/A N/A Weeks of Treatment: 6 N/A N/A Wound Status: Open N/A N/A Wound Recurrence: No N/A N/A Clustered Wound: Yes N/A N/A Measurements L x W x D (cm) 0x0x0 N/A N/A Area (cm) : 0 N/A N/A Volume (cm) : 0 N/A N/A % Reduction in Area: 100.00% N/A N/A % Reduction in Volume: 100.00% N/A N/A Classification: Full Thickness Without Exposed N/A N/A Support Structures Exudate  Amount: None Present N/A N/A Granulation Amount: None Present (0%) N/A N/A Necrotic Amount: None Present (0%) N/A N/A Exposed Structures: Fascia: No N/A N/A Fat Layer (Subcutaneous Tissue): No Tendon: No Muscle: No Joint: No Bone: No Epithelialization: Large (67-100%) N/A N/A Treatment Notes Electronic Signature(s) Signed: 02/07/2022 2:33:39 PM By: Carlene Coria RN Entered By: Carlene Coria on 11/03/2021 08:36:16 [name] ([id number]) -------------------------------------------------------------------------------- Multi-Disciplinary Care Plan Details Patient Name: [name] Date of Service: 11/03/2021 8:00 AM Medical Record Number: [id number] Patient Account Number: [id number] Date of Birth/Sex: [birth year] (80 y.o. M) Treating RN: Carlene Coria Primary Care Paulmichael Schreck: Elio Forget Other Clinician: Referring Collins Kerby: Elio Forget Treating Jeannifer Drakeford/Extender: Yaakov Guthrie in Treatment: 8 Active Inactive Electronic Signature(s) Signed: 02/07/2022 2:33:39 PM By: Carlene Coria RN Entered By: Carlene Coria on 11/03/2021 08:36:08 [name] ([id number]) -------------------------------------------------------------------------------- Pain Assessment Details Patient Name: [name] Date of Service: 11/03/2021 8:00 AM Medical Record Number: [id number] Patient Account Number:  [id number] Date of Birth/Sex: 12/09/1940 (80 y.o. M) Treating RN: Carlene Coria Primary Care Otila Starn: Elio Forget Other Clinician: Referring Katrice Goel: Elio Forget Treating Makensie Mulhall/Extender: Yaakov Guthrie in Treatment: 8 Active Problems Location of Pain Severity and Description of Pain Patient Has Paino No Site Locations Pain Management and Medication Current Pain Management: Electronic Signature(s) Signed: 02/07/2022 2:33:39 PM By: Carlene Coria RN Entered By: Carlene Coria on 11/03/2021 08:07:54 [name] ([id number]) -------------------------------------------------------------------------------- Patient/Caregiver Education Details Patient Name: [name] Date of Service: 11/03/2021 8:00 AM Medical Record Number: [id number] Patient Account Number: [id number] Date of Birth/Gender: [birth year] (80 y.o. M) Treating RN: Carlene Coria Primary Care Physician: Elio Forget Other Clinician: Referring Physician: Elio Forget Treating Physician/Extender: Yaakov Guthrie in Treatment: 8 Education Assessment Education Provided To: Patient Education Topics Provided Wound/Skin Impairment: Methods: Explain/Verbal Responses: State content correctly Electronic Signature(s) Signed: 02/07/2022 2:33:39 PM By: Carlene Coria RN Entered By: Carlene Coria on 11/03/2021 08:38:43 [name] ([id number]) -------------------------------------------------------------------------------- Wound Assessment Details Patient Name: [name] Date of Service: 11/03/2021 8:00 AM Medical Record Number: [id number] Patient Account Number: [id number] Date of Birth/Sex: 1941/02/01 (80 y.o. M) Treating RN: Carlene Coria Primary Care Jarica Plass: Elio Forget Other Clinician: Referring Alanni Vader: Elio Forget Treating Shacola Schussler/Extender: Yaakov Guthrie in Treatment: 8 Wound Status Wound Number: 2 Primary Skin Tear Etiology: Wound Location: Right Forearm Wound Open Wounding  Event: Skin Tear/Laceration Status: Date Acquired: 09/21/2021 Comorbid Arrhythmia, Congestive Heart Failure, Coronary Artery Weeks Of Treatment: 6 History: Disease, Hypertension, Type II Diabetes Clustered Wound: Yes Photos Wound Measurements Length: (cm) 0 Width: (cm) 0 Depth: (cm) 0 Area: (cm) 0 Volume: (cm) 0 % Reduction in Area: 100% % Reduction in Volume: 100% Epithelialization: Large (67-100%) Tunneling: No Undermining: No Wound Description Classification: Full Thickness Without Exposed Support Structures Exudate Amount: None Present Foul Odor After Cleansing: No Slough/Fibrino No Wound Bed Granulation Amount: None Present (0%) Exposed Structure Necrotic Amount: None Present (0%) Fascia Exposed: No Fat Layer (Subcutaneous Tissue) Exposed: No Tendon Exposed: No Muscle Exposed: No Joint Exposed: No Bone Exposed: No Electronic Signature(s) Signed: 02/07/2022 2:33:39 PM By: Carlene Coria RN Entered By: Carlene Coria on 11/03/2021 08:11:06 [name] ([id number]) -------------------------------------------------------------------------------- Vitals Details Patient Name: [name] Date of Service: 11/03/2021 8:00 AM Medical Record Number: [id number] Patient Account Number: [id number] Date of Birth/Sex: 22-Dec-1940 (80 y.o. M) Treating RN: Carlene Coria Primary Care Leveda Kendrix: Elio Forget Other Clinician: Referring Chaddrick Brue: Elio Forget Treating Cosme Jacob/Extender: Yaakov Guthrie in Treatment: 8 Vital Signs Time Taken: 08:07 Temperature (F): 97.9 Height (in): 68 Pulse (bpm): 79 Weight (lbs): 210 Respiratory  Rate (breaths/min): 18 Body Mass Index (BMI): 31.9 Blood Pressure (mmHg): 154/63 Reference Range: 80 - 120 mg / dl Electronic Signature(s) Signed: 02/07/2022 2:33:39 PM By: Yevonne Pax RN Entered By: Yevonne Pax on 11/03/2021 08:07:44

## 2022-02-10 ENCOUNTER — Telehealth: Payer: Self-pay | Admitting: Student

## 2022-02-10 NOTE — Telephone Encounter (Signed)
Attempted to contact patient to schedule Palliative Consult, no answer - left message requesting a return call to let me know if he wanted to pursue Palliative services or not, requested call back by [facility]. 02/14/22.  Called and spoke with son and explained to him that I was trying to patient and why and I then explained to him what Palliative services were and he said that he will talk with the patient this evening about it and he took my name and number and will call me back.

## 2022-02-22 ENCOUNTER — Encounter: Payer: Medicare Other | Attending: Physician Assistant | Admitting: Physician Assistant

## 2022-02-22 DIAGNOSIS — I480 Paroxysmal atrial fibrillation: Secondary | ICD-10-CM | POA: Diagnosis not present

## 2022-02-22 DIAGNOSIS — E1122 Type 2 diabetes mellitus with diabetic chronic kidney disease: Secondary | ICD-10-CM | POA: Insufficient documentation

## 2022-02-22 DIAGNOSIS — I5042 Chronic combined systolic (congestive) and diastolic (congestive) heart failure: Secondary | ICD-10-CM | POA: Diagnosis not present

## 2022-02-22 DIAGNOSIS — E11622 Type 2 diabetes mellitus with other skin ulcer: Secondary | ICD-10-CM | POA: Insufficient documentation

## 2022-02-22 DIAGNOSIS — S40811A Abrasion of right upper arm, initial encounter: Secondary | ICD-10-CM | POA: Insufficient documentation

## 2022-02-22 DIAGNOSIS — I130 Hypertensive heart and chronic kidney disease with heart failure and stage 1 through stage 4 chronic kidney disease, or unspecified chronic kidney disease: Secondary | ICD-10-CM | POA: Diagnosis not present

## 2022-02-22 DIAGNOSIS — I2510 Atherosclerotic heart disease of native coronary artery without angina pectoris: Secondary | ICD-10-CM | POA: Diagnosis not present

## 2022-02-22 DIAGNOSIS — W010XXA Fall on same level from slipping, tripping and stumbling without subsequent striking against object, initial encounter: Secondary | ICD-10-CM | POA: Diagnosis not present

## 2022-02-22 DIAGNOSIS — N183 Chronic kidney disease, stage 3 unspecified: Secondary | ICD-10-CM | POA: Insufficient documentation

## 2022-02-23 NOTE — Progress Notes (Signed)
WESSLEY, EMERT ([id number]) Visit Report for 02/22/2022 Abuse Risk Screen Details Patient Name: Randall Vincent, Randall Vincent Date of Service: 02/22/2022 8:45 AM Medical Record Number: [id number] Patient Account Number: [id number] Date of Birth/Sex: [birth year] (81 y.o. M) Treating RN: Yevonne Pax Primary Care Zonnique Norkus: Patrina Levering Other Clinician: Referring Emoni Whitworth: Self, Referral Treating Eldra Word/Extender: Rowan Blase in Treatment: 0 Abuse Risk Screen Items Answer ABUSE RISK SCREEN: Has anyone close to you tried to hurt or harm you recentlyo No Do you feel uncomfortable with anyone in your familyo No Has anyone forced you do things that you didnot want to doo No Electronic Signature(s) Signed: 02/23/2022 11:46:40 AM By: Yevonne Pax RN Entered By: Yevonne Pax on 02/22/2022 08:58:11 Randall Vincent ([id number]) -------------------------------------------------------------------------------- Activities of Daily Living Details Patient Name: Randall Vincent, Randall Vincent Date of Service: 02/22/2022 8:45 AM Medical Record Number: [id number] Patient Account Number: [id number] Date of Birth/Sex: 03-26-1941 (81 y.o. M) Treating RN: Yevonne Pax Primary Care Yesly Gerety: Patrina Levering Other Clinician: Referring Chanon Loney: Self, Referral Treating Aldridge Krzyzanowski/Extender: Rowan Blase in Treatment: 0 Activities of Daily Living Items Answer Activities of Daily Living (Please select one for each item) Drive Automobile Completely Able Take Medications Completely Able Use Telephone Completely Able Care for Appearance Completely Able Use Toilet Completely Able Bath / Shower Completely Able Dress Self Completely Able Feed Self Completely Able Walk Completely Able Get In / Out Bed Completely Able Housework Completely Able Prepare Meals Completely Able Handle Money Completely Able Shop for Self Completely Able Electronic Signature(s) Signed: 02/23/2022 11:46:40 AM By: Yevonne Pax RN Entered By: Yevonne Pax on  02/22/2022 08:58:45 Randall Vincent ([id number]) -------------------------------------------------------------------------------- Education Screening Details Patient Name: Randall Vincent Date of Service: 02/22/2022 8:45 AM Medical Record Number: [id number] Patient Account Number: [id number] Date of Birth/Sex: [birth year] (81 y.o. M) Treating RN: Yevonne Pax Primary Care Kaelum Kissick: Patrina Levering Other Clinician: Referring Stevey Stapleton: Self, Referral Treating Markesia Crilly/Extender: Rowan Blase in Treatment: 0 Primary Learner Assessed: Patient Learning Preferences/Education Level/Primary Language Learning Preference: Explanation Highest Education Level: High School Preferred Language: English Cognitive Barrier Language Barrier: No Translator Needed: No Memory Deficit: No Emotional Barrier: No Cultural/Religious Beliefs Affecting Medical Care: No Physical Barrier Impaired Vision: Yes Glasses Impaired Hearing: No Decreased Hand dexterity: No Knowledge/Comprehension Knowledge Level: Medium Comprehension Level: High Ability to understand written instructions: High Ability to understand verbal instructions: High Motivation Anxiety Level: Anxious Cooperation: Cooperative Education Importance: Acknowledges Need Interest in Health Problems: Asks Questions Perception: Coherent Willingness to Engage in Self-Management High Activities: Readiness to Engage in Self-Management High Activities: Electronic Signature(s) Signed: 02/23/2022 11:46:40 AM By: Yevonne Pax RN Entered By: Yevonne Pax on 02/22/2022 08:59:33 Randall Vincent ([id number]) -------------------------------------------------------------------------------- Fall Risk Assessment Details Patient Name: Randall Vincent Date of Service: 02/22/2022 8:45 AM Medical Record Number: [id number] Patient Account Number: [id number] Date of Birth/Sex: 30-Aug-1940 (81 y.o. M) Treating RN: Yevonne Pax Primary Care Toney Lizaola: Patrina Levering Other  Clinician: Referring Niquan Charnley: Self, Referral Treating Harnoor Reta/Extender: Rowan Blase in Treatment: 0 Fall Risk Assessment Items Have you had 2 or more falls in the last 12 monthso 0 Yes Have you had any fall that resulted in injury in the last 12 monthso 0 Yes FALLS RISK SCREEN History of falling - immediate or within 3 months 0 No Secondary diagnosis (Do you have 2 or more medical diagnoseso) 0 No Ambulatory aid None/bed rest/wheelchair/nurse 0 No Crutches/cane/walker 15 Yes Furniture 0 No Intravenous therapy Access/Saline/Heparin Lock 0 No Gait/Transferring Normal/ bed rest/ wheelchair 0 No Weak (short steps with or without shuffle, stooped but able to lift  head while walking, may 0 No seek support from furniture) Impaired (short steps with shuffle, may have difficulty arising from chair, head down, impaired 0 No balance) Mental Status Oriented to own ability 0 Yes Electronic Signature(s) Signed: 02/22/2022 9:19:33 AM By: Carlene Coria RN Previous Signature: 02/22/2022 9:18:55 AM Version By: Carlene Coria RN Entered By: Carlene Coria on 02/22/2022 09:19:32 Randall Vincent ([id number]) -------------------------------------------------------------------------------- Foot Assessment Details Patient Name: Randall Vincent Date of Service: 02/22/2022 8:45 AM Medical Record Number: [id number] Patient Account Number: [id number] Date of Birth/Sex: 1940-08-27 (81 y.o. M) Treating RN: Carlene Coria Primary Care Sena Hoopingarner: Elio Forget Other Clinician: Referring Evaleigh Mccamy: Self, Referral Treating Anae Hams/Extender: Skipper Cliche in Treatment: 0 Foot Assessment Items Site Locations + = Sensation present, - = Sensation absent, C = Callus, U = Ulcer R = Redness, W = Warmth, M = Maceration, PU = Pre-ulcerative lesion F = Fissure, S = Swelling, D = Dryness Assessment Right: Left: Other Deformity: No No Prior Foot Ulcer: No No Prior Amputation: No No Charcot Joint: No No Ambulatory  Status: Ambulatory Without Help Gait: Steady Electronic Signature(s) Signed: 02/23/2022 11:46:40 AM By: Carlene Coria RN Entered By: Carlene Coria on 02/22/2022 09:00:20 Randall Vincent ([id number]) -------------------------------------------------------------------------------- Nutrition Risk Screening Details Patient Name: Randall Vincent Date of Service: 02/22/2022 8:45 AM Medical Record Number: [id number] Patient Account Number: [id number] Date of Birth/Sex: June 20, 1940 (81 y.o. M) Treating RN: Carlene Coria Primary Care Atif Chapple: Elio Forget Other Clinician: Referring Vincente Asbridge: Self, Referral Treating Jhan Conery/Extender: Skipper Cliche in Treatment: 0 Height (in): 68 Weight (lbs): 210 Body Mass Index (BMI): 31.9 Nutrition Risk Screening Items Score Screening NUTRITION RISK SCREEN: I have an illness or condition that made me change the kind and/or amount of food I eat 2 Yes I eat fewer than two meals per day 0 No I eat few fruits and vegetables, or milk products 0 No I have three or more drinks of beer, liquor or wine almost every day 0 No I have tooth or mouth problems that make it hard for me to eat 0 No I don't always have enough money to buy the food I need 0 No I eat alone most of the time 1 Yes I take three or more different prescribed or over-the-counter drugs a day 1 Yes Without wanting to, I have lost or gained 10 pounds in the last six months 0 No I am not always physically able to shop, cook and/or feed myself 0 No Nutrition Protocols Good Risk Protocol Moderate Risk Protocol 0 Provide education on nutrition High Risk Proctocol Risk Level: Moderate Risk Score: 4 Electronic Signature(s) Signed: 02/22/2022 9:20:18 AM By: Carlene Coria RN Entered By: Carlene Coria on 02/22/2022 09:20:18

## 2022-02-23 NOTE — Progress Notes (Signed)
Vincent, [name] ([id number]) Visit Report for 02/22/2022 Allergy List Details Patient Name: Randall Vincent, Randall Vincent Date of Service: 02/22/2022 8:45 AM Medical Record Number: [id number] Patient Account Number: [id number] Date of Birth/Sex: January 03, 1941 (81 y.o. M) Treating RN: Carlene Coria Primary Care Randall Vincent: Elio Forget Other Clinician: Referring Mikaeel Petrow: Self, Referral Treating Caddie Randle/Extender: Jeri Cos Weeks in Treatment: 0 Allergies Active Allergies No Known Allergies Allergy Notes Electronic Signature(s) Signed: 02/23/2022 11:46:40 AM By: Carlene Coria RN Entered By: Carlene Coria on 02/22/2022 08:57:27 [name] ([id number]) -------------------------------------------------------------------------------- Arrival Information Details Patient Name: [name] Date of Service: 02/22/2022 8:45 AM Medical Record Number: [id number] Patient Account Number: [id number] Date of Birth/Sex: [birth year] (81 y.o. M) Treating RN: Carlene Coria Primary Care Randall Vincent: Elio Forget Other Clinician: Referring Randall Vincent: Self, Referral Treating Shalandria Elsbernd/Extender: Randall Vincent in Treatment: 0 Visit Information Patient Arrived: Walker Arrival Time: 08:55 Accompanied By: self Transfer Assistance: None Patient Identification Verified: Yes Secondary Verification Process Completed: Yes Patient Requires Transmission-Based Precautions: No Patient Has Alerts: No History Since Last Visit All ordered tests and consults were completed: No Added or deleted any medications: No Any new allergies or adverse reactions: No Had a fall or experienced change in activities of daily living that may affect risk of falls: No Signs or symptoms of abuse/neglect since last visito No Hospitalized since last visit: No Implantable device outside of the clinic excluding cellular tissue based products placed in the center since last visit: No Has Dressing in Place as Prescribed: Yes Electronic Signature(s) Signed:  02/23/2022 11:46:40 AM By: Carlene Coria RN Entered By: Carlene Coria on 02/22/2022 08:56:26 [name] ([id number]) -------------------------------------------------------------------------------- Clinic Level of Care Assessment Details Patient Name: [name] Date of Service: 02/22/2022 8:45 AM Medical Record Number: [id number] Patient Account Number: [id number] Date of Birth/Sex: 04-02-1941 (81 y.o. M) Treating RN: Carlene Coria Primary Care Dal Blew: Elio Forget Other Clinician: Referring Randall Vincent: Self, Referral Treating Randall Vincent/Extender: Randall Vincent in Treatment: 0 Clinic Level of Care Assessment Items TOOL 2 Quantity Score X - Use when only an EandM is performed on the INITIAL visit 1 0 ASSESSMENTS - Nursing Assessment / Reassessment X - General Physical Exam (combine w/ comprehensive assessment (listed just below) when performed on new 1 20 pt. evals) X- 1 25 Comprehensive Assessment (HX, ROS, Risk Assessments, Wounds Hx, etc.) ASSESSMENTS - Wound and Skin Assessment / Reassessment []  - Simple Wound Assessment / Reassessment - one wound 0 []  - 0 Complex Wound Assessment / Reassessment - multiple wounds []  - 0 Dermatologic / Skin Assessment (not related to wound area) ASSESSMENTS - Ostomy and/or Continence Assessment and Care []  - Incontinence Assessment and Management 0 []  - 0 Ostomy Care Assessment and Management (repouching, etc.) PROCESS - Coordination of Care []  - Simple Patient / Family Education for ongoing care 0 []  - 0 Complex (extensive) Patient / Family Education for ongoing care X- 1 10 Staff obtains Programmer, systems, Records, Test Results / Process Orders []  - 0 Staff telephones HHA, Nursing Homes / Clarify orders / etc []  - 0 Routine Transfer to another Facility (non-emergent condition) []  - 0 Routine Hospital Admission (non-emergent condition) []  - 0 New Admissions / Biomedical engineer / Ordering NPWT, Apligraf, etc. []  - 0 Emergency  Hospital Admission (emergent condition) X- 1 10 Simple Discharge Coordination []  - 0 Complex (extensive) Discharge Coordination PROCESS - Special Needs []  - Pediatric / Minor Patient Management 0 []  - 0 Isolation Patient Management []  - 0 Hearing / Language / Visual special needs []  - 0 Assessment of Community assistance (  transportation, D/C planning, etc.) []  - 0 Additional assistance / Altered mentation []  - 0 Support Surface(s) Assessment (bed, cushion, seat, etc.) INTERVENTIONS - Wound Cleansing / Measurement []  - Wound Imaging (photographs - any number of wounds) 0 []  - 0 Wound Tracing (instead of photographs) []  - 0 Simple Wound Measurement - one wound []  - 0 Complex Wound Measurement - multiple wounds Randall, Vincent ([id number]) []  - 0 Simple Wound Cleansing - one wound []  - 0 Complex Wound Cleansing - multiple wounds INTERVENTIONS - Wound Dressings []  - Small Wound Dressing one or multiple wounds 0 []  - 0 Medium Wound Dressing one or multiple wounds []  - 0 Large Wound Dressing one or multiple wounds []  - 0 Application of Medications - injection INTERVENTIONS - Miscellaneous []  - External ear exam 0 []  - 0 Specimen Collection (cultures, biopsies, blood, body fluids, etc.) []  - 0 Specimen(s) / Culture(s) sent or taken to Lab for analysis []  - 0 Patient Transfer (multiple staff / Civil Service fast streamer / Similar devices) []  - 0 Simple Staple / Suture removal (25 or less) []  - 0 Complex Staple / Suture removal (26 or more) []  - 0 Hypo / Hyperglycemic Management (close monitor of Blood Glucose) []  - 0 Ankle / Brachial Index (ABI) - do not check if billed separately Has the patient been seen at the hospital within the last three years: Yes Total Score: 65 Level Of Care: New/Established - Level 2 Electronic Signature(s) Signed: 02/23/2022 11:46:40 AM By: Carlene Coria RN Entered By: Carlene Coria on 02/22/2022 09:50:47 [name]  ([id number]) -------------------------------------------------------------------------------- Encounter Discharge Information Details Patient Name: [name] Date of Service: 02/22/2022 8:45 AM Medical Record Number: [id number] Patient Account Number: [id number] Date of Birth/Sex: 1940/09/06 (81 y.o. M) Treating RN: Carlene Coria Primary Care Janit Cutter: Elio Forget Other Clinician: Referring Phelan Goers: Self, Referral Treating Nollan Muldrow/Extender: Randall Vincent in Treatment: 0 Encounter Discharge Information Items Discharge Condition: Stable Ambulatory Status: Walker Discharge Destination: Home Transportation: Private Auto Accompanied By: self Schedule Follow-up Appointment: Yes Clinical Summary of Care: Electronic Signature(s) Signed: 02/23/2022 11:46:40 AM By: Carlene Coria RN Entered By: Carlene Coria on 02/22/2022 09:59:42 [name] ([id number]) -------------------------------------------------------------------------------- Lower Extremity Assessment Details Patient Name: [name] Date of Service: 02/22/2022 8:45 AM Medical Record Number: [id number] Patient Account Number: [id number] Date of Birth/Sex: 1941/04/01 (81 y.o. M) Treating RN: Carlene Coria Primary Care Amous Crewe: Elio Forget Other Clinician: Referring Brytni Dray: Self, Referral Treating Traeh Milroy/Extender: Randall Vincent in Treatment: 0 Electronic Signature(s) Signed: 02/23/2022 11:46:40 AM By: Carlene Coria RN Entered By: Carlene Coria on 02/22/2022 09:00:36 [name] ([id number]) -------------------------------------------------------------------------------- Multi Wound Chart Details Patient Name: [name] Date of Service: 02/22/2022 8:45 AM Medical Record Number: [id number] Patient Account Number: [id number] Date of Birth/Sex: 07-15-1940 (81 y.o. M) Treating RN: Carlene Coria Primary Care Janitza Revuelta: Elio Forget Other Clinician: Referring Abriana Saltos: Self, Referral Treating Luisfernando Brightwell/Extender:  Randall Vincent in Treatment: 0 Vital Signs Height(in): 68 Pulse(bpm): 85 Weight(lbs): 210 Blood Pressure(mmHg): 142/67 Body Mass Index(BMI): 31.9 Temperature(F): 98.2 Respiratory Rate(breaths/min): 18 Wound Assessments Treatment Notes Electronic Signature(s) Signed: 02/23/2022 11:46:40 AM By: Carlene Coria RN Entered By: Carlene Coria on 02/22/2022 09:47:19 [name] ([id number]) -------------------------------------------------------------------------------- Multi-Disciplinary Care Plan Details Patient Name: [name] Date of Service: 02/22/2022 8:45 AM Medical Record Number: [id number] Patient Account Number: [id number] Date of Birth/Sex: November 07, 1940 (81 y.o. M) Treating RN: Carlene Coria Primary Care Dushawn Pusey: Elio Forget Other Clinician: Referring Monta Police: Self, Referral Treating Mikaelyn Arthurs/Extender: Randall Vincent in Treatment: 0 Active Inactive Electronic Signature(s) Signed: 02/23/2022 11:46:40 AM  By: Carlene Coria RN Entered By: Carlene Coria on 02/22/2022 09:46:38 [name] ([id number]) -------------------------------------------------------------------------------- Pain Assessment Details Patient Name: [name] Date of Service: 02/22/2022 8:45 AM Medical Record Number: [id number] Patient Account Number: [id number] Date of Birth/Sex: 25-Jan-1941 (81 y.o. M) Treating RN: Carlene Coria Primary Care Sesilia Poucher: Elio Forget Other Clinician: Referring Adelina Collard: Self, Referral Treating Aurora Rody/Extender: Randall Vincent in Treatment: 0 Active Problems Location of Pain Severity and Description of Pain Patient Has Paino No Site Locations Pain Management and Medication Current Pain Management: Electronic Signature(s) Signed: 02/23/2022 11:46:40 AM By: Carlene Coria RN Entered By: Carlene Coria on 02/22/2022 08:56:37 [name] ([id number]) -------------------------------------------------------------------------------- Patient/Caregiver Education  Details Patient Name: [name] Date of Service: 02/22/2022 8:45 AM Medical Record Number: [id number] Patient Account Number: [id number] Date of Birth/Gender: 1940-08-27 (81 y.o. M) Treating RN: Carlene Coria Primary Care Physician: Elio Forget Other Clinician: Referring Physician: Self, Referral Treating Physician/Extender: Randall Vincent in Treatment: 0 Education Assessment Education Provided To: Patient Education Topics Provided Nutrition: Handouts: Elevated Blood Sugars: How Do They Affect Wound Healing Methods: Explain/Verbal Responses: State content correctly Offloading: Methods: Explain/Verbal Responses: State content correctly Pressure: Handouts: Pressure Ulcers: Care and Offloading Methods: Explain/Verbal Responses: State content correctly Safety: Handouts: Personal Safety Methods: Explain/Verbal Responses: State content correctly Wound/Skin Impairment: Methods: Explain/Verbal Responses: State content correctly Electronic Signature(s) Signed: 02/23/2022 11:46:40 AM By: Carlene Coria RN Entered By: Carlene Coria on 02/22/2022 09:53:03 [name] ([id number]) -------------------------------------------------------------------------------- [place] Details Patient Name: [name] Date of Service: 02/22/2022 8:45 AM Medical Record Number: [id number] Patient Account Number: [id number] Date of Birth/Sex: 01/09/1941 (81 y.o. M) Treating RN: Carlene Coria Primary Care Brinlynn Gorton: Elio Forget Other Clinician: Referring Briston Lax: Self, Referral Treating Anahita Cua/Extender: Randall Vincent in Treatment: 0 Vital Signs Time Taken: 08:55 Temperature (F): 98.2 Height (in): 68 Pulse (bpm): 85 Source: Stated Respiratory Rate (breaths/min): 18 Weight (lbs): 210 Blood Pressure (mmHg): 142/67 Body Mass Index (BMI): 31.9 Reference Range: 80 - 120 mg / dl Electronic Signature(s) Signed: 02/23/2022 11:46:40 AM By: Carlene Coria RN Entered By: Carlene Coria on  02/22/2022 08:57:14

## 2022-02-24 NOTE — Progress Notes (Signed)
Randall Vincent, Randall Vincent ([id number]) Visit Report for 02/22/2022 Chief Complaint Document Details Patient Name: Randall Vincent, Randall Vincent Date of Service: 02/22/2022 8:45 AM Medical Record Number: [id number] Patient Account Number: [id number] Date of Birth/Sex: 1940-08-18 (81 y.o. M) Treating RN: Yevonne Pax Primary Care Provider: Patrina Levering Other Clinician: Referring Provider: Self, Referral Treating Provider/Extender: Rowan Blase in Treatment: 0 Information Obtained from: Patient Chief Complaint Gluteal skin breakdown Electronic Signature(s) Signed: 02/22/2022 9:44:07 AM By: Lenda Kelp PA-C Entered By: Lenda Kelp on 02/22/2022 09:44:06 Randall Vincent ([id number]) -------------------------------------------------------------------------------- HPI Details Patient Name: Randall Vincent Date of Service: 02/22/2022 8:45 AM Medical Record Number: [id number] Patient Account Number: [id number] Date of Birth/Sex: 10/14/1940 (81 y.o. M) Treating RN: Yevonne Pax Primary Care Provider: Patrina Levering Other Clinician: Referring Provider: Self, Referral Treating Provider/Extender: Rowan Blase in Treatment: 0 History of Present Illness HPI Description: Admission 09/08/2021 [name] is a 81 year old male with a past medical history of congestive heart failure, CKD stage III, type 2 diabetes currently controlled on oral medications and coronary artery disease status post CABG x2 that presents the clinic for a wound on his right arm for the past two weeks. He states he was walking his dog and Got tripped over the leash and fell creating an abrasion to his right arm. He states he visited urgent care and they helped stop the bleeding. He has not been using any wound care dressings but has been keeping the area covered with a Band-Aid. He currently denies signs of infection. 4/26; patient presents for follow-up. He has been using Xeroform dressings without issues. He reports improvement in  wound healing. He has no issues or complaints today. 5/3; patient presents for follow-up. He has been using Xeroform without issues. Unfortunately he fell yesterday and developed a skin tear to his right upper extremity. He visited the ED for this issue. They placed 50 sutures. He was started on Keflex. 5/10; patient presents for follow-up. He has Xeroform on the wound bed today. We discussed using antibiotic ointment however I am not sure if he has been doing this. He has no issues or complaints today. He denies signs of infection. 5/17; patient presents for follow-up. He has been using mupirocin to the wound bed. He is having trouble doing dressing changes with Xeroform. He denies signs of infection. 5/24; patient presents for follow-up. He has been using mupirocin to the wound bed without issues. This is an easy dressing for him to do on his own. He is pleased with his wound healing so far. He denies signs of infection. 5/31; patient presents for follow-up. He continues to put mupirocin on the wound bed. He has no issues or concerns today. He currently denies signs of infection. 10-27-2021 upon evaluation today patient's wound is almost completely healed. He has been using mupirocin and a Band-Aid and overall honestly I think he is doing quite well. He is very close to complete resolution which is great news. 6/14; patient has been using mupirocin daily. He reports improvement in wound healing. He has no issues or complaints today. Readmission: 02-22-2022 upon evaluation today patient presents for initial evaluation here in the clinic with me although he has previously been seen by Dr. Mikey Bussing from April through June of 2023. Subsequently this was for a different issue where he had an area on his right arm. With that being said that is not a problem at this point. Subsequently the issue that he is concerned with today is that he is on a blood  thinner and subsequently he tells me that he has  several times where he is noted that he had a skin tear in the gluteal region after he gets up from something especially when he has had to scoot and he has significant bleeding that is very difficult to stop and when he finally gets it to stop it still takes a while for this to actually heal following. With that being said he does tell me that he sits and sleeps in a lift chair all the time. That is pretty much where he stays in at home. He also tells me that he gets up frequently due to being on fluid pills. So he thinks that at least every 30 minutes to an hour he is up and moving around and at night he gets up several times during the night. Nonetheless there does appear to be significant discoloration due to chronic pressure in the gluteal region and upper thighs posteriorly. This is where he typically has issues though right now he has nothing actually open. Electronic Signature(s) Signed: 02/22/2022 4:13:53 PM By: Lenda Kelp PA-C Entered By: Lenda Kelp on 02/22/2022 16:13:53 Randall Vincent ([id number]) -------------------------------------------------------------------------------- Physical Exam Details Patient Name: Randall Vincent Date of Service: 02/22/2022 8:45 AM Medical Record Number: [id number] Patient Account Number: [id number] Date of Birth/Sex: Oct 15, 1940 (81 y.o. M) Treating RN: Yevonne Pax Primary Care Provider: Patrina Levering Other Clinician: Referring Provider: Self, Referral Treating Provider/Extender: Rowan Blase in Treatment: 0 Constitutional patient is hypertensive.. pulse regular and within target range for patient.[name] respirations regular, non-labored and within target range for patient.[name] temperature within target range for patient.. Well-nourished and well-hydrated in no acute distress. Eyes conjunctiva clear no eyelid edema noted. pupils equal round and reactive to light and accommodation. Ears, Nose, Mouth, and Throat no gross abnormality of ear  auricles or external auditory canals. normal hearing noted during conversation. mucus membranes moist. Respiratory normal breathing without difficulty. Musculoskeletal normal gait and posture. Psychiatric this patient is able to make decisions and demonstrates good insight into disease process. Alert and Oriented x 3. pleasant and cooperative. Notes Upon evaluation today patient again has no actual wound openings though he does have significant issues with what appears to be chronic pressure injury he does blanch but he has capillary refill in the area of blanching which is somewhere around 6 to 8 seconds and extremely slow. Nonetheless I do believe that this shows evidence of a chronic pressure issue that he needs to definitely get under control. Electronic Signature(s) Signed: 02/22/2022 4:14:44 PM By: Lenda Kelp PA-C Entered By: Lenda Kelp on 02/22/2022 16:14:44 Randall Vincent ([id number]) -------------------------------------------------------------------------------- Physician Orders Details Patient Name: Randall Vincent Date of Service: 02/22/2022 8:45 AM Medical Record Number: [id number] Patient Account Number: [id number] Date of Birth/Sex: April 27, 1941 (81 y.o. M) Treating RN: Yevonne Pax Primary Care Provider: Patrina Levering Other Clinician: Referring Provider: Self, Referral Treating Provider/Extender: Rowan Blase in Treatment: 0 Verbal / Phone Orders: No Diagnosis Coding ICD-10 Coding Code Description L98.8 Other specified disorders of the skin and subcutaneous tissue E11.622 Type 2 diabetes mellitus with other skin ulcer I48.0 Paroxysmal atrial fibrillation I50.42 Chronic combined systolic (congestive) and diastolic (congestive) heart failure I10 Essential (primary) hypertension Discharge From [facility] o Consult Only - patient to apply AandD ointment 3 to 4 times per day Electronic Signature(s) Signed: 02/23/2022 11:46:40 AM By: Yevonne Pax RN Signed:  02/24/2022 2:50:44 PM By: Lenda Kelp PA-C Entered By: Yevonne Pax on 02/22/2022 09:50:04 Randall Vincent ([id number]) --------------------------------------------------------------------------------  Problem List Details Patient Name: ALLISTER, LESSLEY Date of Service: 02/22/2022 8:45 AM Medical Record Number: [id number] Patient Account Number: [id number] Date of Birth/Sex: 1940-08-11 (81 y.o. M) Treating RN: Yevonne Pax Primary Care Provider: Patrina Levering Other Clinician: Referring Provider: Self, Referral Treating Provider/Extender: Rowan Blase in Treatment: 0 Active Problems ICD-10 Encounter Code Description Active Date MDM Diagnosis L98.8 Other specified disorders of the skin and subcutaneous tissue 02/22/2022 No Yes E11.622 Type 2 diabetes mellitus with other skin ulcer 02/22/2022 No Yes I48.0 Paroxysmal atrial fibrillation 02/22/2022 No Yes I50.42 Chronic combined systolic (congestive) and diastolic (congestive) heart 02/22/2022 No Yes failure I10 Essential (primary) hypertension 02/22/2022 No Yes Z79.01 Long term (current) use of anticoagulants 02/22/2022 No Yes Inactive Problems Resolved Problems Electronic Signature(s) Signed: 02/22/2022 4:13:08 PM By: Lenda Kelp PA-C Previous Signature: 02/22/2022 9:43:47 AM Version By: Lenda Kelp PA-C Entered By: Lenda Kelp on 02/22/2022 16:13:08 Randall Vincent ([id number]) -------------------------------------------------------------------------------- Progress Note Details Patient Name: Randall Vincent Date of Service: 02/22/2022 8:45 AM Medical Record Number: [id number] Patient Account Number: [id number] Date of Birth/Sex: [birth year] (81 y.o. M) Treating RN: Yevonne Pax Primary Care Provider: Patrina Levering Other Clinician: Referring Provider: Self, Referral Treating Provider/Extender: Rowan Blase in Treatment: 0 Subjective Chief Complaint Information obtained from Patient Gluteal skin breakdown History of  Present Illness (HPI) Admission 09/08/2021 Mr. Kennis Wissmann is a 81 year old male with a past medical history of congestive heart failure, CKD stage III, type 2 diabetes currently controlled on oral medications and coronary artery disease status post CABG x2 that presents the clinic for a wound on his right arm for the past two weeks. He states he was walking his dog and Got tripped over the leash and fell creating an abrasion to his right arm. He states he visited urgent care and they helped stop the bleeding. He has not been using any wound care dressings but has been keeping the area covered with a Band-Aid. He currently denies signs of infection. 4/26; patient presents for follow-up. He has been using Xeroform dressings without issues. He reports improvement in wound healing. He has no issues or complaints today. 5/3; patient presents for follow-up. He has been using Xeroform without issues. Unfortunately he fell yesterday and developed a skin tear to his right upper extremity. He visited the ED for this issue. They placed 50 sutures. He was started on Keflex. 5/10; patient presents for follow-up. He has Xeroform on the wound bed today. We discussed using antibiotic ointment however I am not sure if he has been doing this. He has no issues or complaints today. He denies signs of infection. 5/17; patient presents for follow-up. He has been using mupirocin to the wound bed. He is having trouble doing dressing changes with Xeroform. He denies signs of infection. 5/24; patient presents for follow-up. He has been using mupirocin to the wound bed without issues. This is an easy dressing for him to do on his own. He is pleased with his wound healing so far. He denies signs of infection. 5/31; patient presents for follow-up. He continues to put mupirocin on the wound bed. He has no issues or concerns today. He currently denies signs of infection. 10-27-2021 upon evaluation today patient's wound is  almost completely healed. He has been using mupirocin and a Band-Aid and overall honestly I think he is doing quite well. He is very close to complete resolution which is great news. 6/14; patient has been using mupirocin daily. He reports improvement in wound healing. He  has no issues or complaints today. Readmission: 02-22-2022 upon evaluation today patient presents for initial evaluation here in the clinic with me although he has previously been seen by Dr. Heber Balfour from April through June of 2023. Subsequently this was for a different issue where he had an area on his right arm. With that being said that is not a problem at this point. Subsequently the issue that he is concerned with today is that he is on a blood thinner and subsequently he tells me that he has several times where he is noted that he had a skin tear in the gluteal region after he gets up from something especially when he has had to scoot and he has significant bleeding that is very difficult to stop and when he finally gets it to stop it still takes a while for this to actually heal following. With that being said he does tell me that he sits and sleeps in a lift chair all the time. That is pretty much where he stays in at home. He also tells me that he gets up frequently due to being on fluid pills. So he thinks that at least every 30 minutes to an hour he is up and moving around and at night he gets up several times during the night. Nonetheless there does appear to be significant discoloration due to chronic pressure in the gluteal region and upper thighs posteriorly. This is where he typically has issues though right now he has nothing actually open. Patient History Allergies No Known Allergies Social History Former smoker, Marital Status - Widowed, Alcohol Use - Rarely, Drug Use - No History, Caffeine Use - Daily. Medical History Respiratory Denies history of Chronic Obstructive Pulmonary Disease  (COPD) Cardiovascular Patient has history of Arrhythmia - afib, Congestive Heart Failure, Coronary Artery Disease, Hypertension Endocrine Patient has history of Type II Diabetes ASHTIN, ROSNER ([id number]) Objective Constitutional patient is hypertensive.. pulse regular and within target range for patient.[name] respirations regular, non-labored and within target range for patient.[name] temperature within target range for patient.. Well-nourished and well-hydrated in no acute distress. Vitals Time Taken: 8:55 AM, Height: 68 in, Source: Stated, Weight: 210 lbs, BMI: 31.9, Temperature: 98.2 F, Pulse: 85 bpm, Respiratory Rate: 18 breaths/min, Blood Pressure: 142/67 mmHg. Eyes conjunctiva clear no eyelid edema noted. pupils equal round and reactive to light and accommodation. Ears, Nose, Mouth, and Throat no gross abnormality of ear auricles or external auditory canals. normal hearing noted during conversation. mucus membranes moist. Respiratory normal breathing without difficulty. Musculoskeletal normal gait and posture. Psychiatric this patient is able to make decisions and demonstrates good insight into disease process. Alert and Oriented x 3. pleasant and cooperative. General Notes: Upon evaluation today patient again has no actual wound openings though he does have significant issues with what appears to be chronic pressure injury he does blanch but he has capillary refill in the area of blanching which is somewhere around 6 to 8 seconds and extremely slow. Nonetheless I do believe that this shows evidence of a chronic pressure issue that he needs to definitely get under control. Assessment Active Problems ICD-10 Other specified disorders of the skin and subcutaneous tissue Type 2 diabetes mellitus with other skin ulcer Paroxysmal atrial fibrillation Chronic combined systolic (congestive) and diastolic (congestive) heart failure Essential (primary) hypertension Long term (current) use of  anticoagulants Plan Discharge From [facility]: Consult Only - patient to apply AandD ointment 3 to 4 times per day 1. I had a lengthy conversation with the  patient today about things he can do to benefit himself and prevent this from causing skin tears in the gluteal region. These are good to be outlined below. 2. I think he primarily needs to apply AandD ointment to the gluteal region in order to protect the skin in helping to maintain it supple nature. This will help prevent skin tears. 3. I also believe the patient is going to need to get him a Roho cushion and I think the Roho mosaic would do well for him. I discussed today that this is something he can get off [employer] we printed this off for him we also wrote down the AandD ointment. 4. Also did tell him that he needs to make sure to get up and move around at least every 30 minutes to an hour throughout the day in order to CulbertsonHOMAS, Greggory StallionGEORGE ([id number]) get blood flow into the gluteal region. Coupled with the other items here I think he should do quite well in order to prevent any skin tears and hopefully not have this ongoing issue. 5. He does have a cushion for the car which is mainly when he sliding back-and-forth that is very slick and allows him to be able to get in more easily I think he should continue to use that for the car. Were seeing the patient for a consult only today and we will see him back for follow-up visit as needed. Electronic Signature(s) Signed: 02/22/2022 4:16:19 PM By: Lenda KelpStone III, Shondrika Hoque PA-C Entered By: Lenda KelpStone III, Rolan Wrightsman on 02/22/2022 16:16:19 Randall HaysHOMAS, Johnston ([id number]) -------------------------------------------------------------------------------- ROS/PFSH Details Patient Name: Randall HaysHOMAS, Jariel Date of Service: 02/22/2022 8:45 AM Medical Record Number: [id number] Patient Account Number: [id number] Date of Birth/Sex: 03/14/1941 (81 y.o. M) Treating RN: Yevonne PaxEpps, Carrie Primary Care Provider: Patrina Leveringerrell, Grace Other  Clinician: Referring Provider: Self, Referral Treating Provider/Extender: Rowan BlaseStone, Atleigh Gruen Weeks in Treatment: 0 Respiratory Medical History: Negative for: Chronic Obstructive Pulmonary Disease (COPD) Cardiovascular Medical History: Positive for: Arrhythmia - afib; Congestive Heart Failure; Coronary Artery Disease; Hypertension Endocrine Medical History: Positive for: Type II Diabetes Treated with: Oral agents Blood sugar tested every day: No Immunizations Pneumococcal Vaccine: Received Pneumococcal Vaccination: Yes Received Pneumococcal Vaccination On or After 60th Birthday: Yes Implantable Devices None Family and Social History Former smoker; Marital Status - Widowed; Alcohol Use: Rarely; Drug Use: No History; Caffeine Use: Daily Electronic Signature(s) Signed: 02/23/2022 11:46:40 AM By: Yevonne PaxEpps, Carrie RN Signed: 02/24/2022 2:50:44 PM By: Lenda KelpStone III, Sherard Sutch PA-C Entered By: Yevonne PaxEpps, Carrie on 02/22/2022 08:58:03 Randall HaysHOMAS, Nikolaos ([id number]) -------------------------------------------------------------------------------- SuperBill Details Patient Name: Randall HaysHOMAS, Rik Date of Service: 02/22/2022 Medical Record Number: [id number] Patient Account Number: [id number] Date of Birth/Sex: 01/26/1941 (81 y.o. M) Treating RN: Yevonne PaxEpps, Carrie Primary Care Provider: Patrina Leveringerrell, Grace Other Clinician: Referring Provider: Self, Referral Treating Provider/Extender: Rowan BlaseStone, Letricia Krinsky Weeks in Treatment: 0 Diagnosis Coding ICD-10 Codes Code Description L98.8 Other specified disorders of the skin and subcutaneous tissue E11.622 Type 2 diabetes mellitus with other skin ulcer I48.0 Paroxysmal atrial fibrillation I50.42 Chronic combined systolic (congestive) and diastolic (congestive) heart failure I10 Essential (primary) hypertension Facility Procedures CPT4 Code: [id number] Description: [phone] - WOUND CARE VISIT-LEV 2 EST PT Modifier: Quantity: 1 Physician Procedures CPT4 Code: [id number] Description: 99213 - WC PHYS  LEVEL 3 - EST PT Modifier: Quantity: 1 CPT4 Code: Description: ICD-10 Diagnosis Description L98.8 Other specified disorders of the skin and subcutaneous tissue E11.622 Type 2 diabetes mellitus with other skin ulcer I48.0 Paroxysmal atrial fibrillation I50.42 Chronic combined systolic (congestive) and  diastolic (congestive) hear Modifier: t failure Quantity: Electronic  Signature(s) Signed: 02/22/2022 4:16:32 PM By: Lenda Kelp PA-C Entered By: Lenda Kelp on 02/22/2022 16:16:29

## 2022-03-15 ENCOUNTER — Ambulatory Visit: Payer: Medicare Other | Admitting: Physician Assistant

## 2022-04-03 DIAGNOSIS — M25469 Effusion, unspecified knee: Secondary | ICD-10-CM | POA: Insufficient documentation

## 2022-05-01 DIAGNOSIS — H353231 Exudative age-related macular degeneration, bilateral, with active choroidal neovascularization: Secondary | ICD-10-CM | POA: Insufficient documentation

## 2022-05-01 DIAGNOSIS — D62 Acute posthemorrhagic anemia: Secondary | ICD-10-CM | POA: Insufficient documentation

## 2022-05-04 DIAGNOSIS — M79606 Pain in leg, unspecified: Secondary | ICD-10-CM | POA: Insufficient documentation

## 2022-05-04 NOTE — Progress Notes (Signed)
MRN : [id number]  Randall Vincent is a 81 y.o. ([birth year]) male who presents with chief complaint of legs hurt and swell.  History of Present Illness:   Patient is seen for evaluation of leg swelling. The patient first noticed the swelling remotely but is now concerned because of a significant increase in the overall edema.  He notes the right leg is much more affected than the left.  Also concern to him is the fact that the swelling is now coursing above the knee to the high thigh area.  The swelling discomfort.  There has been an increasing amount of  discoloration noted by the patient over the years and both ankle areas. The patient notes that in the morning the legs are improved but they steadily worsened throughout the course of the day. Elevation seems to make the swelling of the legs better, dependency makes them much worse.   There is no history of ulcerations associated with the swelling.   The patient denies any recent changes in their medications.  The patient has not been wearing graduated compression.  The patient has no had any past angiography, interventions or vascular surgery.  The patient denies a history of DVT or PE. There is no prior history of phlebitis. There is no history of primary lymphedema.  There is no history of radiation treatment to the groin or pelvis No history of malignancies. No history of trauma or groin or pelvic surgery. No history of foreign travel or parasitic infections area   No outpatient medications have been marked as taking for the 05/05/22 encounter (Appointment) with Delana Meyer, Dolores Lory, MD.    Past Medical History:  Diagnosis Date   A-fib [facility])    a.) CHA2DS2-VASc = 6 (age x 2, CHF, HTN, previous MI, T2DM). b.) Rate/rhythm maintained on amiodarone + diltiazem + carvedilol; chronically anticoagulated using dabigatran. c.) DCCV 07/10/2015, 08/08/2019, 10/22/2019.   Arthritis    Chest pain 06/26/2019   CHF (congestive heart  failure) (HCC)    CKD (chronic kidney disease), stage III ([place])    Coronary artery disease 1991   a.) 2v CABG in 1991. b.) PCI 06/07/2006 --> Vision stents to native LCx and RCA   Hyperlipidemia    Hypertension    Long term current use of anticoagulant    a.) dabigatran   Myocardial infarction ([place]) 1991   a.) LHC --> 50% and 90% LAD lesions; referred to CVTS. 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   a.) LIMA-LAD, SVG-RCA   T2DM (type 2 diabetes mellitus) ([place])     Past Surgical History:  Procedure Laterality Date   BACK SURGERY  1970   L5-L6 ruptured disk   BLADDER SURGERY  2000   CARDIOVERSION N/A 07/10/2015   CARDIOVERSION N/A 08/08/2019   CARDIOVERSION N/A 10/22/2019   COLON SURGERY     COLONOSCOPY N/A 01/15/2020   Procedure: COLONOSCOPY;  Surgeon: Virgel Manifold, MD;  Location: [facility] ENDOSCOPY;  Service: Endoscopy;  Laterality: N/A;   COLONOSCOPY N/A 01/16/2020   Procedure: COLONOSCOPY;  Surgeon: Virgel Manifold, MD;  Location: [facility] ENDOSCOPY;  Service: Endoscopy;  Laterality: N/A;   CORONARY ARTERY BYPASS GRAFT N/A 01/02/1990   Procedure: 2v CORONARY ARTERY BYPASS GRAFT (LIMA-LAD, SVG-RCA)   ESOPHAGOGASTRODUODENOSCOPY N/A 01/13/2020   Procedure: ESOPHAGOGASTRODUODENOSCOPY (EGD);  Surgeon: Virgel Manifold, MD;  Location: [facility] ENDOSCOPY;  Service: Endoscopy;  Laterality:  N/A;   REPLACEMENT TOTAL KNEE BILATERAL Bilateral 2008   STENT PLACEMENT VASCULAR ([facility] HX)  2008   TOTAL KNEE REVISION Left 01/16/2020   Procedure: TOTAL KNEE REVISION;  Surgeon: Lyndle Herrlich, MD;  Location: [facility] ORS;  Service: Orthopedics;  Laterality: Left;   TOTAL KNEE REVISION Left 05/31/2021   Procedure: TOTAL KNEE REVISION;  Surgeon: Lyndle Herrlich, MD;  Location: [facility] ORS;  Service: Orthopedics;  Laterality: Left;    Social History Social History   Tobacco Use   Smoking status: Former    Types: Cigarettes    Quit date:  1991    Years since quitting: 32.9   Smokeless tobacco: Never  Vaping Use   Vaping Use: Never used  Substance Use Topics   Alcohol use: Not Currently    Alcohol/week: 1.0 standard drink of alcohol    Types: 1 Cans of beer per week    Comment: very rarely   Drug use: Never    Family History No family history on file.  No Known Allergies   REVIEW OF SYSTEMS (Negative unless checked)  Constitutional: [] Weight loss  [] Fever  [] Chills Cardiac: [] Chest pain   [] Chest pressure   [] Palpitations   [] Shortness of breath when laying flat   [] Shortness of breath with exertion. Vascular:  [] Pain in legs with walking   [x] Pain in legs at rest  [] History of DVT   [] Phlebitis   [x] Swelling in legs   [] Varicose veins   [] Non-healing ulcers Pulmonary:   [] Uses home oxygen   [] Productive cough   [] Hemoptysis   [] Wheeze  [] COPD   [] Asthma Neurologic:  [] Dizziness   [] Seizures   [] History of stroke   [] History of TIA  [] Aphasia   [] Vissual changes   [] Weakness or numbness in arm   [] Weakness or numbness in leg Musculoskeletal:   [] Joint swelling   [] Joint pain   [] Low back pain Hematologic:  [] Easy bruising  [] Easy bleeding   [] Hypercoagulable state   [] Anemic Gastrointestinal:  [] Diarrhea   [] Vomiting  [] Gastroesophageal reflux/heartburn   [] Difficulty swallowing. Genitourinary:  [] Chronic kidney disease   [] Difficult urination  [] Frequent urination   [] Blood in urine Skin:  [] Rashes   [] Ulcers  Psychological:  [] History of anxiety   []  History of major depression.  Physical Examination  There were no vitals filed for this visit. There is no height or weight on file to calculate BMI. Gen: WD/WN, NAD Head: [place]/AT, No temporalis wasting.  Ear/Nose/Throat: Hearing grossly intact, nares w/o erythema or drainage, pinna without lesions Eyes: PER, EOMI, sclera nonicteric.  Neck: Supple, no gross masses.  No JVD.  Pulmonary:  Good air movement, no audible wheezing, no use of accessory muscles.   Cardiac: RRR, precordium not hyperdynamic. Vascular:  scattered varicosities present bilaterally.  Severe venous stasis changes to the legs bilaterally.  2-3+ non-pitting edema skin of the ankle and distal calf is markedly fibrotic Vessel Right Left  Radial Palpable Palpable  Gastrointestinal: soft, non-distended. No guarding/no peritoneal signs.  Musculoskeletal: M/S 5/5 throughout.  No deformity.  Neurologic: CN 2-12 intact. Pain and light touch intact in extremities.  Symmetrical.  Speech is fluent. Motor exam as listed above. Psychiatric: Judgment intact, Mood & affect appropriate for pt's clinical situation. Dermatologic: Venous rashes no ulcers noted.  No changes consistent with cellulitis. Lymph : Severe skin changes of chronic lymphedema and venous insufficiency.  CBC Lab Results  Component Value Date   WBC 5.0 02/01/2022   HGB 9.0 (L) 02/01/2022   HCT 31.0 (L)  02/01/2022   MCV 88.8 02/01/2022   PLT 162 02/01/2022    BMET    Component Value Date/Time   NA 139 02/01/2022 0822   K 4.1 02/01/2022 0822   CL 102 02/01/2022 0822   CO2 28 02/01/2022 0822   GLUCOSE 111 (H) 02/01/2022 0822   BUN 36 (H) 02/01/2022 0822   CREATININE 1.64 (H) 02/01/2022 0822   CALCIUM 8.3 (L) 02/01/2022 0822   GFRNONAA 42 (L) 02/01/2022 0822   GFRAA 44 (L) 02/09/2020 0500   CrCl cannot be calculated (Patient's most recent lab result is older than the maximum 21 days allowed.).  COAG Lab Results  Component Value Date   INR 1.5 (H) 05/20/2021   INR 1.5 (H) 01/27/2020   INR 1.7 (H) 01/11/2020    Radiology No results found.   Assessment/Plan 1. Pain of lower extremity, unspecified laterality Recommend:  I have had a long discussion with the patient regarding swelling and why it  causes symptoms.  Patient will begin wearing graduated compression on a daily basis, a prescription was given.  Given the extent of his disease his associated comorbidities and the shape and size of his legs I  have focused primarily on compression wraps.  I do not believe that he will be able to find compression socks or stockings that will be suitable.  I demonstrated the wraps using multiple pictures from the Internet and have referred him to [facility] to further investigate these garments.  The patient will  wear compression first thing in the morning and removing them in the evening. The patient is instructed specifically not to sleep in the stockings.   In addition, behavioral modification will be initiated.  This will include frequent elevation specifically addressing utilizing a recliner, use of over the counter pain medications and exercise such as walking.  Consideration for a lymph pump will also be made based upon the effectiveness of conservative therapy.  This would help to improve the edema control and prevent sequela such as ulcers and infections   Patient should undergo duplex ultrasound of the venous system to ensure that DVT or reflux is not present.  The patient will follow-up with me after the ultrasound.  - VAS [place] LOWER EXTREMITY VENOUS (DVT); Future  2. Localized edema Recommend:  I have had a long discussion with the patient regarding swelling and why it  causes symptoms.  Patient will begin wearing graduated compression on a daily basis, a prescription was given.  Given the extent of his disease his associated comorbidities and the shape and size of his legs I have focused primarily on compression wraps.  I do not believe that he will be able to find compression socks or stockings that will be suitable.  I demonstrated the wraps using multiple pictures from the Internet and have referred him to [facility] to further investigate these garments.  The patient will  wear compression first thing in the morning and removing them in the evening. The patient is instructed specifically not to sleep in the stockings.   In addition, behavioral modification will be initiated.  This will include  frequent elevation specifically addressing utilizing a recliner, use of over the counter pain medications and exercise such as walking.  Consideration for a lymph pump will also be made based upon the effectiveness of conservative therapy.  This would help to improve the edema control and prevent sequela such as ulcers and infections   Patient should undergo duplex ultrasound of the venous system to ensure that  DVT or reflux is not present.  The patient will follow-up with me after the ultrasound.  - VAS [place] LOWER EXTREMITY VENOUS (DVT); Future  3. Atrial fibrillation status post cardioversion 10/22/19 [facility]) Continue antiarrhythmia medications as already ordered, these medications have been reviewed and there are no changes at this time.  Continue anticoagulation as ordered by Cardiology Service  4. CAD, multiple vessel Continue cardiac and antihypertensive medications as already ordered and reviewed, no changes at this time.  Continue statin as ordered and reviewed, no changes at this time  Nitrates PRN for chest pain  5. Type 2 diabetes mellitus without complication, with long-term current use of insulin (HCC) Continue hypoglycemic medications as already ordered, these medications have been reviewed and there are no changes at this time.  Hgb A1C to be monitored as already arranged by primary service    Hortencia Pilar, MD  05/04/2022 4:48 PM

## 2022-05-05 ENCOUNTER — Ambulatory Visit (INDEPENDENT_AMBULATORY_CARE_PROVIDER_SITE_OTHER): Payer: Medicare Other | Admitting: Vascular Surgery

## 2022-05-05 ENCOUNTER — Encounter (INDEPENDENT_AMBULATORY_CARE_PROVIDER_SITE_OTHER): Payer: Self-pay | Admitting: Vascular Surgery

## 2022-05-05 VITALS — BP 140/61 | HR 55 | Resp 14 | Ht 70.0 in | Wt 210.0 lb

## 2022-05-05 DIAGNOSIS — R600 Localized edema: Secondary | ICD-10-CM

## 2022-05-05 DIAGNOSIS — I2510 Atherosclerotic heart disease of native coronary artery without angina pectoris: Secondary | ICD-10-CM | POA: Diagnosis not present

## 2022-05-05 DIAGNOSIS — I4891 Unspecified atrial fibrillation: Secondary | ICD-10-CM

## 2022-05-05 DIAGNOSIS — M79606 Pain in leg, unspecified: Secondary | ICD-10-CM | POA: Diagnosis not present

## 2022-05-05 DIAGNOSIS — E119 Type 2 diabetes mellitus without complications: Secondary | ICD-10-CM

## 2022-05-05 DIAGNOSIS — Z794 Long term (current) use of insulin: Secondary | ICD-10-CM

## 2022-05-06 ENCOUNTER — Encounter (INDEPENDENT_AMBULATORY_CARE_PROVIDER_SITE_OTHER): Payer: Self-pay | Admitting: Vascular Surgery

## 2022-05-29 ENCOUNTER — Emergency Department: Payer: Medicare Other

## 2022-05-29 ENCOUNTER — Inpatient Hospital Stay
Admission: EM | Admit: 2022-05-29 | Discharge: 2022-06-11 | DRG: 291 | Disposition: A | Discharge status: Hospice | Payer: Medicare Other | Attending: Student | Admitting: Student

## 2022-05-29 ENCOUNTER — Other Ambulatory Visit: Payer: Self-pay

## 2022-05-29 DIAGNOSIS — Z79899 Other long term (current) drug therapy: Secondary | ICD-10-CM

## 2022-05-29 DIAGNOSIS — K922 Gastrointestinal hemorrhage, unspecified: Secondary | ICD-10-CM | POA: Diagnosis present

## 2022-05-29 DIAGNOSIS — E1122 Type 2 diabetes mellitus with diabetic chronic kidney disease: Secondary | ICD-10-CM | POA: Diagnosis present

## 2022-05-29 DIAGNOSIS — I5043 Acute on chronic combined systolic (congestive) and diastolic (congestive) heart failure: Secondary | ICD-10-CM | POA: Diagnosis not present

## 2022-05-29 DIAGNOSIS — E785 Hyperlipidemia, unspecified: Secondary | ICD-10-CM | POA: Diagnosis present

## 2022-05-29 DIAGNOSIS — Z66 Do not resuscitate: Secondary | ICD-10-CM | POA: Diagnosis present

## 2022-05-29 DIAGNOSIS — I2489 Other forms of acute ischemic heart disease: Secondary | ICD-10-CM | POA: Diagnosis not present

## 2022-05-29 DIAGNOSIS — I10 Essential (primary) hypertension: Secondary | ICD-10-CM | POA: Diagnosis present

## 2022-05-29 DIAGNOSIS — I509 Heart failure, unspecified: Secondary | ICD-10-CM

## 2022-05-29 DIAGNOSIS — L89896 Pressure-induced deep tissue damage of other site: Secondary | ICD-10-CM

## 2022-05-29 DIAGNOSIS — I4891 Unspecified atrial fibrillation: Secondary | ICD-10-CM | POA: Diagnosis not present

## 2022-05-29 DIAGNOSIS — Z6835 Body mass index (BMI) 35.0-35.9, adult: Secondary | ICD-10-CM

## 2022-05-29 DIAGNOSIS — Z9889 Other specified postprocedural states: Secondary | ICD-10-CM

## 2022-05-29 DIAGNOSIS — E119 Type 2 diabetes mellitus without complications: Secondary | ICD-10-CM

## 2022-05-29 DIAGNOSIS — E669 Obesity, unspecified: Secondary | ICD-10-CM | POA: Diagnosis present

## 2022-05-29 DIAGNOSIS — Z955 Presence of coronary angioplasty implant and graft: Secondary | ICD-10-CM

## 2022-05-29 DIAGNOSIS — I081 Rheumatic disorders of both mitral and tricuspid valves: Secondary | ICD-10-CM | POA: Diagnosis present

## 2022-05-29 DIAGNOSIS — I130 Hypertensive heart and chronic kidney disease with heart failure and stage 1 through stage 4 chronic kidney disease, or unspecified chronic kidney disease: Principal | ICD-10-CM | POA: Diagnosis present

## 2022-05-29 DIAGNOSIS — N179 Acute kidney failure, unspecified: Secondary | ICD-10-CM | POA: Diagnosis not present

## 2022-05-29 DIAGNOSIS — I872 Venous insufficiency (chronic) (peripheral): Secondary | ICD-10-CM | POA: Diagnosis not present

## 2022-05-29 DIAGNOSIS — Z9989 Dependence on other enabling machines and devices: Secondary | ICD-10-CM

## 2022-05-29 DIAGNOSIS — D631 Anemia in chronic kidney disease: Secondary | ICD-10-CM | POA: Diagnosis present

## 2022-05-29 DIAGNOSIS — J9811 Atelectasis: Secondary | ICD-10-CM | POA: Diagnosis present

## 2022-05-29 DIAGNOSIS — I2729 Other secondary pulmonary hypertension: Secondary | ICD-10-CM | POA: Diagnosis present

## 2022-05-29 DIAGNOSIS — H5462 Unqualified visual loss, left eye, normal vision right eye: Secondary | ICD-10-CM | POA: Diagnosis present

## 2022-05-29 DIAGNOSIS — I252 Old myocardial infarction: Secondary | ICD-10-CM

## 2022-05-29 DIAGNOSIS — Z7984 Long term (current) use of oral hypoglycemic drugs: Secondary | ICD-10-CM

## 2022-05-29 DIAGNOSIS — D500 Iron deficiency anemia secondary to blood loss (chronic): Secondary | ICD-10-CM | POA: Diagnosis present

## 2022-05-29 DIAGNOSIS — I480 Paroxysmal atrial fibrillation: Secondary | ICD-10-CM | POA: Diagnosis present

## 2022-05-29 DIAGNOSIS — I959 Hypotension, unspecified: Secondary | ICD-10-CM | POA: Diagnosis not present

## 2022-05-29 DIAGNOSIS — I5082 Biventricular heart failure: Secondary | ICD-10-CM | POA: Diagnosis present

## 2022-05-29 DIAGNOSIS — D61818 Other pancytopenia: Secondary | ICD-10-CM | POA: Diagnosis present

## 2022-05-29 DIAGNOSIS — Z7901 Long term (current) use of anticoagulants: Secondary | ICD-10-CM

## 2022-05-29 DIAGNOSIS — E559 Vitamin D deficiency, unspecified: Secondary | ICD-10-CM | POA: Diagnosis present

## 2022-05-29 DIAGNOSIS — Z87891 Personal history of nicotine dependence: Secondary | ICD-10-CM

## 2022-05-29 DIAGNOSIS — Z515 Encounter for palliative care: Secondary | ICD-10-CM

## 2022-05-29 DIAGNOSIS — Z96653 Presence of artificial knee joint, bilateral: Secondary | ICD-10-CM | POA: Diagnosis present

## 2022-05-29 DIAGNOSIS — I2510 Atherosclerotic heart disease of native coronary artery without angina pectoris: Secondary | ICD-10-CM | POA: Diagnosis present

## 2022-05-29 DIAGNOSIS — N1832 Chronic kidney disease, stage 3b: Secondary | ICD-10-CM | POA: Diagnosis not present

## 2022-05-29 DIAGNOSIS — Z951 Presence of aortocoronary bypass graft: Secondary | ICD-10-CM

## 2022-05-29 DIAGNOSIS — G4733 Obstructive sleep apnea (adult) (pediatric): Secondary | ICD-10-CM | POA: Diagnosis present

## 2022-05-29 LAB — TROPONIN I (HIGH SENSITIVITY): Troponin I (High Sensitivity): 22 ng/L — ABNORMAL HIGH (ref <18)

## 2022-05-29 LAB — HEPATIC FUNCTION PANEL
ALT: 17 U/L (ref 0–44)
AST: 25 U/L (ref 15–41)
Albumin: 2.8 g/dL — ABNORMAL LOW (ref 3.5–5.0)
Alkaline Phosphatase: 102 U/L (ref 38–126)
Bilirubin, Direct: 0.4 mg/dL — ABNORMAL HIGH (ref 0.0–0.2)
Indirect Bilirubin: 0.8 mg/dL (ref 0.3–0.9)
Total Bilirubin: 1.2 mg/dL (ref 0.3–1.2)
Total Protein: 6.3 g/dL — ABNORMAL LOW (ref 6.5–8.1)

## 2022-05-29 LAB — BASIC METABOLIC PANEL
Anion gap: 8 (ref 5–15)
BUN: 48 mg/dL — ABNORMAL HIGH (ref 8–23)
CO2: 28 mmol/L (ref 22–32)
Calcium: 8.2 mg/dL — ABNORMAL LOW (ref 8.9–10.3)
Chloride: 104 mmol/L (ref 98–111)
Creatinine, Ser: 2.18 mg/dL — ABNORMAL HIGH (ref 0.61–1.24)
GFR, Estimated: 30 mL/min — ABNORMAL LOW (ref >60)
Glucose, Bld: 109 mg/dL — ABNORMAL HIGH (ref 70–99)
Potassium: 4.3 mmol/L (ref 3.5–5.1)
Sodium: 140 mmol/L (ref 135–145)

## 2022-05-29 LAB — CBC
HCT: 31.8 % — ABNORMAL LOW (ref 39.0–52.0)
Hemoglobin: 9.3 g/dL — ABNORMAL LOW (ref 13.0–17.0)
MCH: 26.6 pg (ref 26.0–34.0)
MCHC: 29.2 g/dL — ABNORMAL LOW (ref 30.0–36.0)
MCV: 90.9 fL (ref 80.0–100.0)
Platelets: 170 10*3/uL (ref 150–400)
RBC: 3.5 MIL/uL — ABNORMAL LOW (ref 4.22–5.81)
RDW: 18.6 % — ABNORMAL HIGH (ref 11.5–15.5)
WBC: 4.8 10*3/uL (ref 4.0–10.5)
nRBC: 0 % (ref 0.0–0.2)

## 2022-05-29 LAB — CBG MONITORING, ED
Glucose-Capillary: 106 mg/dL — ABNORMAL HIGH (ref 70–99)
Glucose-Capillary: 114 mg/dL — ABNORMAL HIGH (ref 70–99)

## 2022-05-29 LAB — MAGNESIUM: Magnesium: 2.4 mg/dL (ref 1.7–2.4)

## 2022-05-29 LAB — BRAIN NATRIURETIC PEPTIDE: B Natriuretic Peptide: 1349.1 pg/mL — ABNORMAL HIGH (ref 0.0–100.0)

## 2022-05-29 MED ORDER — ACETAMINOPHEN 325 MG PO TABS
650.0000 mg | ORAL_TABLET | ORAL | Status: DC | PRN
  Administered 2022-05-30 – 2022-06-10 (×22): 650 mg via ORAL
  Filled 2022-05-29 (×24): qty 2

## 2022-05-29 MED ORDER — APIXABAN 2.5 MG PO TABS
2.5000 mg | ORAL_TABLET | Freq: Two times a day (BID) | ORAL | Status: DC
  Administered 2022-05-29 – 2022-06-03 (×10): 2.5 mg via ORAL
  Filled 2022-05-29 (×11): qty 1

## 2022-05-29 MED ORDER — AMIODARONE HCL 200 MG PO TABS
200.0000 mg | ORAL_TABLET | Freq: Every day | ORAL | Status: DC
  Administered 2022-05-30 – 2022-06-11 (×13): 200 mg via ORAL
  Filled 2022-05-29 (×14): qty 1

## 2022-05-29 MED ORDER — INSULIN ASPART 100 UNIT/ML IJ SOLN
0.0000 [IU] | Freq: Three times a day (TID) | INTRAMUSCULAR | Status: DC
  Administered 2022-06-11: 3 [IU] via SUBCUTANEOUS
  Filled 2022-05-29 (×4): qty 1

## 2022-05-29 MED ORDER — FUROSEMIDE 10 MG/ML IJ SOLN: 40.0000 mg | Freq: Two times a day (BID) | INTRAMUSCULAR | Status: DC

## 2022-05-29 MED ORDER — FUROSEMIDE 10 MG/ML IJ SOLN
60.0000 mg | Freq: Once | INTRAMUSCULAR | Status: AC
  Administered 2022-05-29: 60 mg via INTRAVENOUS
  Filled 2022-05-29: qty 8

## 2022-05-29 MED ORDER — SODIUM CHLORIDE 0.9% FLUSH
3.0000 mL | INTRAVENOUS | Status: DC | PRN
  Administered 2022-05-30: 3 mL via INTRAVENOUS

## 2022-05-29 MED ORDER — RAMIPRIL 5 MG PO CAPS
10.0000 mg | ORAL_CAPSULE | Freq: Every day | ORAL | Status: DC
  Administered 2022-05-30 – 2022-05-31 (×2): 10 mg via ORAL
  Filled 2022-05-29: qty 2
  Filled 2022-05-29: qty 1
  Filled 2022-05-29: qty 2

## 2022-05-29 MED ORDER — ONDANSETRON HCL 4 MG/2ML IJ SOLN: 4.0000 mg | Freq: Four times a day (QID) | INTRAMUSCULAR | Status: DC | PRN

## 2022-05-29 MED ORDER — SODIUM CHLORIDE 0.9% FLUSH
3.0000 mL | Freq: Two times a day (BID) | INTRAVENOUS | Status: DC
  Administered 2022-05-29 – 2022-05-30 (×3): 3 mL via INTRAVENOUS

## 2022-05-29 MED ORDER — FUROSEMIDE 10 MG/ML IJ SOLN
40.0000 mg | Freq: Two times a day (BID) | INTRAMUSCULAR | Status: DC
  Administered 2022-05-29 – 2022-05-30 (×2): 40 mg via INTRAVENOUS
  Filled 2022-05-29 (×2): qty 4

## 2022-05-29 MED ORDER — SODIUM CHLORIDE 0.9 % IV SOLN: 250.0000 mL | INTRAVENOUS | Status: DC | PRN

## 2022-05-29 MED ORDER — CARVEDILOL 6.25 MG PO TABS
12.5000 mg | ORAL_TABLET | Freq: Two times a day (BID) | ORAL | Status: DC
  Administered 2022-05-29: 12.5 mg via ORAL
  Filled 2022-05-29 (×2): qty 2

## 2022-05-29 MED ORDER — ATORVASTATIN CALCIUM 20 MG PO TABS
40.0000 mg | ORAL_TABLET | Freq: Every evening | ORAL | Status: DC
  Administered 2022-05-29 – 2022-06-10 (×13): 40 mg via ORAL
  Filled 2022-05-29 (×12): qty 2

## 2022-05-29 NOTE — Assessment & Plan Note (Addendum)
Well-managed with semaglutide with last A1c of 5.9% 4 weeks ago.  - Hold home semaglutide while admitted - SSI, moderate

## 2022-05-29 NOTE — ED Notes (Signed)
Bladder Scan done per MD request pt had 0 mL in bladder post void.

## 2022-05-29 NOTE — Assessment & Plan Note (Signed)
Per Dr. Etta Quill most recent note, plans were to obtain ABIs.  Will do so at this time and if no evidence of PAD, plan to order Unna boots  - ABIs pending - If negative, order Unna boots

## 2022-05-29 NOTE — ED Triage Notes (Signed)
Pt states coming in with shortness of breath and fluid on his abdomen. Pt states history of CHF. Pt denies chest pain. Pt states noticing the fluid build up for 1 week, but has been getting worse. Pt states swelling to his abdomen and groin.

## 2022-05-29 NOTE — Assessment & Plan Note (Signed)
Continue home antihypertensives 

## 2022-05-29 NOTE — Assessment & Plan Note (Addendum)
Patient has a history of moderately reduced EF with last EF of 45-50%, follows with Dr. Clayborn Bigness.  Despite increasing doses of home torsemide, patient presents with marked pitting edema extending up to the mid abdomen.  - S/p Lasix 60 mg IV once - Start Lasix 40 mg IV twice daily - Strict in and out - Daily weights - Continue home GDMT - Echocardiogram ordered to reassess LVEF

## 2022-05-29 NOTE — ED Provider Notes (Signed)
[facility] Provider Note    Event Date/Time   First MD Initiated Contact with Patient 05/29/22 1116     (approximate)   History   Shortness of Breath   HPI  Randall Vincent is a 82 y.o. male history of CHF presents to the ER for worsening lower extremity swelling with pitting edema to his abdomen feeling distention as well as worsening orthopnea and exertional dyspnea.  Does take torsemide and was seen by cardiology on the third with instructions to double his diuretic at home but symptoms continue to worsen.  He is denying any chest pain no fevers or chills.  Has been compliant with his medications.     Physical Exam   Triage Vital Signs: ED Triage Vitals  Enc Vitals Group     BP 05/29/22 0944 118/62     Pulse Rate 05/29/22 0944 65     Resp 05/29/22 0944 20     Temp 05/29/22 0944 97.7 F (36.5 C)     Temp src --      SpO2 05/29/22 0944 98 %     Weight 05/29/22 0945 215 lb (97.5 kg)     Height 05/29/22 0945 5\' 8"  (1.727 m)     Head Circumference --      Peak Flow --      Pain Score 05/29/22 0945 0     Pain Loc --      Pain Edu? --      Excl. in [place]? --     Most recent vital signs: Vitals:   05/29/22 0944  BP: 118/62  Pulse: 65  Resp: 20  Temp: 97.7 F (36.5 C)  SpO2: 98%     Constitutional: Alert  Eyes: Conjunctivae are normal.  Head: Atraumatic. Nose: No congestion/rhinnorhea. Mouth/Throat: Mucous membranes are moist.   Neck: Painless ROM.  Cardiovascular:   Good peripheral circulation. Respiratory: Normal respiratory effort.  No retractions.  Gastrointestinal: Soft and nontender.  Pitting edema above umbilicus Musculoskeletal:  no deformity.  BLE pitting edema Neurologic:  MAE spontaneously. No gross focal neurologic deficits are appreciated.  Skin:  Skin is warm, dry and intact. No rash noted. Psychiatric: Mood and affect are normal. Speech and behavior are normal.    ED Results / Procedures / Treatments   Labs (all  labs ordered are listed, but only abnormal results are displayed) Labs Reviewed  BASIC METABOLIC PANEL - Abnormal; Notable for the following components:      Result Value   Glucose, Bld 109 (*)    BUN 48 (*)    Creatinine, Ser 2.18 (*)    Calcium 8.2 (*)    GFR, Estimated 30 (*)    All other components within normal limits  CBC - Abnormal; Notable for the following components:   RBC 3.50 (*)    Hemoglobin 9.3 (*)    HCT 31.8 (*)    MCHC 29.2 (*)    RDW 18.6 (*)    All other components within normal limits  HEPATIC FUNCTION PANEL - Abnormal; Notable for the following components:   Total Protein 6.3 (*)    Albumin 2.8 (*)    Bilirubin, Direct 0.4 (*)    All other components within normal limits  BRAIN NATRIURETIC PEPTIDE - Abnormal; Notable for the following components:   B Natriuretic Peptide 1,349.1 (*)    All other components within normal limits  TROPONIN I (HIGH SENSITIVITY) - Abnormal; Notable for the following components:   Troponin I (High Sensitivity) 22 (*)  All other components within normal limits  TROPONIN I (HIGH SENSITIVITY)     EKG  ED ECG REPORT I, Willy Eddy, the attending physician, personally viewed and interpreted this ECG.   Date: 05/29/2022  EKG Time: 9:47  Rate: 60  Rhythm: sinus  Axis: right  Intervals:bifascicular block  ST&T Change: no stemi, no depressions    RADIOLOGY Please see ED Course for my review and interpretation.  I personally reviewed all radiographic images ordered to evaluate for the above acute complaints and reviewed radiology reports and findings.  These findings were personally discussed with the patient.  Please see medical record for radiology report.    PROCEDURES:  Critical Care performed: No  Procedures   MEDICATIONS ORDERED IN ED: Medications  furosemide (LASIX) injection 60 mg (60 mg Intravenous Given 05/29/22 1302)     IMPRESSION / MDM / ASSESSMENT AND PLAN / ED COURSE  I reviewed the triage  vital signs and the nursing notes.                              Differential diagnosis includes, but is not limited to, Asthma, copd, CHF, pna, ptx, malignancy, Pe, anemia  Patient presenting to the ER for evaluation of symptoms as described above.  Based on symptoms, risk factors and considered above differential, this presenting complaint could reflect a potentially life-threatening illness therefore the patient will be placed on continuous pulse oximetry and telemetry for monitoring.  Laboratory evaluation will be sent to evaluate for the above complaints.  With exam for endings presentation concerning for CHF exacerbation with extensive pulmonary edema leading to above the umbilicus.  He is not hypoxic.  Will check labs.  Not consistent with PE DVTs on anticoagulation.  Low suspicion for ACS.    Clinical Course as of 05/29/22 1310  Sun May 29, 2022  1219 BNP is significantly elevated.  Given the extent of his edema will order IV Lasix.  Given his 35 pound weight gain in the past week despite increased outpatient diuresis do feel he is can require hospitalization for further management.  Have consulted hospitalist for admission. [PR]    Clinical Course User Index [PR] Willy Eddy, MD     FINAL CLINICAL IMPRESSION(S) / ED DIAGNOSES   Final diagnoses:  Acute on chronic congestive heart failure, unspecified heart failure type [facility])     Rx / DC Orders   ED Discharge Orders     None        Note:  This document was prepared using Dragon voice recognition software and may include unintentional dictation errors.    Willy Eddy, MD 05/29/22 1310

## 2022-05-29 NOTE — Evaluation (Signed)
Physical Therapy Evaluation Patient Details Name: Randall Vincent MRN: [id number] DOB: 02-Apr-1941 Today's Date: 05/29/2022  History of Present Illness  Patient is an 82 year old male who presents to ED for SOB. Patient has had gradually worsening LE swelling that reached to his abdomen. 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   Clinical Impression  Patient is a pleasant 82 year old male who presents with generalized weakness. Prior to hospital admission, pt was mod I and lives in a camper in his son's backyard. Patient has a cane and walker for community mobility and shops independently. Patient sleeps in a recliner at baseline.  Patient is in bed after a bladder scan. Patient is able to transfer EOB with additional time. He ambulates safely however ambulation is terminated  due to SP02 dropped to 83%. Nursing notified. Patient returned to bed with Sp02 raised to >90%.  Pt would benefit from skilled PT to address noted impairments and functional limitations (see below for any additional details).  Upon hospital discharge, pt would benefit from outpatient physical therapy for strength and balance.        Recommendations for follow up therapy are one component of a multi-disciplinary discharge planning process, led by the attending physician.  Recommendations may be updated based on patient status, additional functional criteria and insurance authorization.  Follow Up Recommendations Outpatient PT      Assistance Recommended at Discharge Set up Supervision/Assistance  Patient can return home with the following  Assistance with cooking/housework;A little help with bathing/dressing/bathroom;Assist for transportation;Help with stairs or ramp for entrance    Equipment Recommendations None recommended by PT (patient has all equiptment needed at home)  Recommendations for Other Services       Functional Status Assessment Patient has had  a recent decline in their functional status and demonstrates the ability to make significant improvements in function in a reasonable and predictable amount of time.     Precautions / Restrictions Precautions Precautions: Fall Restrictions Weight Bearing Restrictions: No      Mobility  Bed Mobility Overal bed mobility: Modified Independent             General bed mobility comments: additional time to get EOB    Transfers Overall transfer level: Modified independent Equipment used: Rolling walker (2 wheels)               General transfer comment: CGA for safety but able to perform without assistance    Ambulation/Gait Ambulation/Gait assistance: Supervision Gait Distance (Feet): 50 Feet Assistive device: Rolling walker (2 wheels) Gait Pattern/deviations: Step-through pattern, Decreased weight shift to right Gait velocity: decreased     General Gait Details: Patient Sp02 dropped to 83% with ambulation  Stairs            Wheelchair Mobility    Modified Rankin (Stroke Patients Only)       Balance Overall balance assessment: Needs assistance Sitting-balance support: No upper extremity supported, Feet supported Sitting balance-Leahy Scale: Good Sitting balance - Comments: able to sit and reach without LOB   Standing balance support: Bilateral upper extremity supported, During functional activity Standing balance-Leahy Scale: Good Standing balance comment: Able to static stand with SUE support, walk with BUE support on walker                             Pertinent Vitals/Pain Pain Assessment Pain  Assessment: 0-10 Pain Score: 3  Pain Location: R shoulder and R knee Pain Descriptors / Indicators: Aching Pain Intervention(s): Limited activity within patient's tolerance, Repositioned, Relaxation, Monitored during session    [place] expects to be discharged to:: Private residence Living Arrangements: Alone (lives in  son's backyard in a camper) Available Help at Discharge: Family;Available 24 hours/day Type of Home: Other(Comment) (camper) Home Access: Stairs to enter Entrance Stairs-Rails: Left Entrance Stairs-Number of Steps: 5   Home Layout: One level Home Equipment: Cane - single point;Rollator (4 wheels) Additional Comments: Lives in a camper in his son's backyard    Prior Function Prior Level of Function : Independent/Modified Independent;Driving;History of Falls (last six months)             Mobility Comments: Independent community ambulator. Lives in a camper next to son's home.  He furniture surfs while ambulating. ADLs Comments: Patient reports indep, drives and does his own shopping. Does occasionally go to son's home for some meals.     Hand Dominance   Dominant Hand: Right    Extremity/Trunk Assessment   Upper Extremity Assessment Upper Extremity Assessment: Defer to OT evaluation    Lower Extremity Assessment Lower Extremity Assessment: Generalized weakness (RLE 4-/5 LLE 4/5)    Cervical / Trunk Assessment Cervical / Trunk Assessment: Normal  Communication   Communication: No difficulties  Cognition Arousal/Alertness: Awake/alert Behavior During Therapy: WFL for tasks assessed/performed Overall Cognitive Status: Within Functional Limits for tasks assessed                                 General Comments: A and O x 4        General Comments General comments (skin integrity, edema, etc.): Walking limited by SP02 drop to 83%    Exercises Other Exercises Other Exercises: Patient educated on role of PT in acute care setting, safe mobility and transfers.   Assessment/Plan    PT Assessment Patient needs continued PT services  PT Problem List Decreased strength;Decreased activity tolerance;Decreased mobility;Decreased balance;Cardiopulmonary status limiting activity       PT Treatment Interventions DME instruction;Gait training;Stair  training;Therapeutic exercise;Therapeutic activities;Functional mobility training;Balance training;Neuromuscular re-education;Manual techniques;Patient/family education    PT Goals (Current goals can be found in the Care Plan section)  Acute Rehab PT Goals Patient Stated Goal: to return home PT Goal Formulation: With patient Time For Goal Achievement: 06/12/22 Potential to Achieve Goals: Fair    Frequency Min 2X/week     Co-evaluation               AM-PAC PT "6 Clicks" Mobility  Outcome Measure Help needed turning from your back to your side while in a flat bed without using bedrails?: None Help needed moving from lying on your back to sitting on the side of a flat bed without using bedrails?: A Little Help needed moving to and from a bed to a chair (including a wheelchair)?: None Help needed standing up from a chair using your arms (e.g., wheelchair or bedside chair)?: A Little Help needed to walk in hospital room?: A Little Help needed climbing 3-5 steps with a railing? : A Little 6 Click Score: 20    End of Session Equipment Utilized During Treatment: Gait belt Activity Tolerance: Patient tolerated treatment well;Other (comment) (ambulation limited by SP02 drop to 83%) Patient left: in bed;with call bell/phone within reach Nurse Communication: Mobility status;Other (comment) (SP02 drop) PT Visit Diagnosis: Unsteadiness on  feet (R26.81);Other abnormalities of gait and mobility (R26.89);Repeated falls (R29.6);Muscle weakness (generalized) (M62.81);History of falling (Z91.81);Difficulty in walking, not elsewhere classified (R26.2)    Time: 5615-3794 PT Time Calculation (min) (ACUTE ONLY): 19 min   Charges:   PT Evaluation $PT Eval Moderate Complexity: 1 Mod          Precious Bard, PT, DPT  05/29/2022, 4:02 PM

## 2022-05-29 NOTE — H&P (Addendum)
History and Physical    Patient: Randall Vincent [id number] DOB: 06-03-1940 DOA: 05/29/2022 DOS: the patient was seen and examined on 05/29/2022 PCP: Elspeth Cho., MD  Patient coming from: Home  Chief Complaint:  Chief Complaint  Patient presents with   Shortness of Breath   HPI: Randall Vincent is a 82 y.o. male with medical history significant of HFmrEF with last EF of 45-50%, CAD s/p CABG, atrial fibrillation on AC, CKD stage III, hypertension, hyperlipidemia, type 2 diabetes who presents to the ED due to shortness of breath.  Randall Vincent states that over the last several days, he has noticed gradually worsening lower extremity swelling that is now extending into his abdomen.  He endorses shortness of breath and orthopnea but denies any chest pain or palpitations.  He has noticed that since his stomach began to have swelling, he cannot eat as much as prior.  He followed up with his cardiologist on 1/3 and increased his torsemide from 20 mg twice daily to 20 mg 3 times daily.  Despite this, the swelling continues to worsen.  He denies any other symptoms at this time including nausea, vomiting, diarrhea, abdominal pain, fever, chills.  ED course: On arrival to the ED, patient was afebrile at 97.7 with blood pressure 118/62 heart rate of 65.  He was saturating at 98% on room air.  Initial workup remarkable for creatinine of 2.18, BUN of 48, GFR of 30, BNP of 1300 and troponin of 22.  Hemoglobin 9.3. Elevated in the setting of recent operation.  Chest x-ray was obtained that demonstrated interstitial edema, mild, with bilateral pleural effusions.  Due to failure of outpatient treatment, [facility] contacted for admission for acute on chronic heart failure.  Review of Systems: As mentioned in the history of present illness. All other systems reviewed and are negative.  Past Medical History:  Diagnosis Date   A-fib [facility])    a.) CHA2DS2-VASc = 6 (age x 2, CHF, HTN, previous MI, T2DM). b.) Rate/rhythm  maintained on amiodarone + diltiazem + carvedilol; chronically anticoagulated using dabigatran. c.) DCCV 07/10/2015, 08/08/2019, 10/22/2019.   Arthritis    Chest pain 06/26/2019   CHF (congestive heart failure) (HCC)    CKD (chronic kidney disease), stage III (HCC)    Coronary artery disease 1991   a.) 2v CABG in 1991. b.) PCI 06/07/2006 --> Vision stents to native LCx and RCA   Hyperlipidemia    Hypertension    Long term current use of anticoagulant    a.) dabigatran   Myocardial infarction (HCC) 1991   a.) LHC --> 50% and 90% LAD lesions; referred to CVTS. 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   a.) LIMA-LAD, SVG-RCA   T2DM (type 2 diabetes mellitus) (HCC)    Past Surgical History:  Procedure Laterality Date   BACK SURGERY  1970   L5-L6 ruptured disk   BLADDER SURGERY  2000   CARDIOVERSION N/A 07/10/2015   CARDIOVERSION N/A 08/08/2019   CARDIOVERSION N/A 10/22/2019   COLON SURGERY     COLONOSCOPY N/A 01/15/2020   Procedure: COLONOSCOPY;  Surgeon: Pasty Spillers, MD;  Location: [facility] ENDOSCOPY;  Service: Endoscopy;  Laterality: N/A;   COLONOSCOPY N/A 01/16/2020   Procedure: COLONOSCOPY;  Surgeon: Pasty Spillers, MD;  Location: [facility] ENDOSCOPY;  Service: Endoscopy;  Laterality: N/A;   CORONARY ARTERY BYPASS GRAFT N/A 01/02/1990   Procedure: 2v CORONARY ARTERY BYPASS GRAFT (LIMA-LAD, SVG-RCA)  ESOPHAGOGASTRODUODENOSCOPY N/A 01/13/2020   Procedure: ESOPHAGOGASTRODUODENOSCOPY (EGD);  Surgeon: Pasty Spillers, MD;  Location: [facility] ENDOSCOPY;  Service: Endoscopy;  Laterality: N/A;   REPLACEMENT TOTAL KNEE BILATERAL Bilateral 2008   STENT PLACEMENT VASCULAR ([facility] HX)  2008   TOTAL KNEE REVISION Left 01/16/2020   Procedure: TOTAL KNEE REVISION;  Surgeon: Lyndle Herrlich, MD;  Location: [facility] ORS;  Service: Orthopedics;  Laterality: Left;   TOTAL KNEE REVISION Left 05/31/2021   Procedure: TOTAL KNEE REVISION;  Surgeon:  Lyndle Herrlich, MD;  Location: [facility] ORS;  Service: Orthopedics;  Laterality: Left;   Social History:  reports that he quit smoking about 33 years ago. His smoking use included cigarettes. He has never used smokeless tobacco. He reports that he does not currently use alcohol after a past usage of about 1.0 standard drink of alcohol per week. He reports that he does not use drugs.  No Known Allergies  No family history on file.  Prior to Admission medications   Medication Sig Start Date End Date Taking? Authorizing Provider  amiodarone (PACERONE) 200 MG tablet Take 200 mg by mouth daily. 10/29/19   [provider]  apixaban (ELIQUIS) 5 MG TABS tablet Take 1 tablet (5 mg total) by mouth 2 (two) times daily. 09/20/21   Cipriano Bunker, MD  atorvastatin (LIPITOR) 40 MG tablet Take 40 mg by mouth every evening.  06/18/19   [provider]  carvedilol (COREG) 3.125 MG tablet Take 1 tablet (3.125 mg total) by mouth 2 (two) times daily with a meal. 02/09/20   Wouk, Wilfred Curtis, MD  colchicine 0.6 MG tablet Take 0.6 mg by mouth 2 (two) times daily. Patient not taking: Reported on 05/05/2022 09/06/21   [provider]  diltiazem (CARDIZEM CD) 240 MG 24 hr capsule Take 240 mg by mouth daily. 06/21/21   [provider]  metFORMIN (GLUCOPHAGE-XR) 500 MG 24 hr tablet Take 500 mg by mouth 2 (two) times daily. Patient not taking: Reported on 05/05/2022 04/07/21   [provider]  PROAIR RESPICLICK 108 [phone] Base) MCG/ACT AEPB Inhale into the lungs.    [provider]  ramipril (ALTACE) 10 MG capsule Take 1 capsule (10 mg total) by mouth daily. 01/31/20   Enedina Finner, MD  Semaglutide, 1 MG/DOSE, 2 MG/1.5ML SOPN Inject 1 mg into the skin every Thursday.    [provider]  torsemide (DEMADEX) 20 MG tablet Take 1 tablet (20 mg total) by mouth 2 (two) times daily. Patient taking differently: Take 10-20 mg by mouth See admin instructions. Take 20 mg by mouth in  the morning and 10 mg in the evening 02/09/20   Wouk, Wilfred Curtis, MD  vitamin B-12 (CYANOCOBALAMIN) 1000 MCG tablet Take 1,000 mcg by mouth daily.    [provider]    Physical Exam: Vitals:   05/29/22 0944 05/29/22 0945 05/29/22 1244  BP: 118/62    Pulse: 65    Resp: 20    Temp: 97.7 F (36.5 C)    SpO2: 98%    Weight:  97.5 kg 104.8 kg  Height:  5\' 8"  (1.727 m)    Physical Exam Vitals and nursing note reviewed.  Constitutional:      General: He is not in acute distress.    Appearance: He is obese. He is not toxic-appearing.  HENT:     Head: Normocephalic and atraumatic.     Mouth/Throat:     Mouth: Mucous membranes are moist.     Pharynx: Oropharynx is  clear.  Eyes:     Conjunctiva/sclera: Conjunctivae normal.  Cardiovascular:     Rate and Rhythm: Normal rate. Rhythm irregular.     Heart sounds: No murmur heard.    No gallop.     Comments: Bilateral 3+ pitting edema up into the thighs and sacral region extending into the mid abdomen. Pulmonary:     Effort: Pulmonary effort is normal.     Breath sounds: Rales (Fine inspiratory crackles up to bilateral mid lung fields) present. No decreased breath sounds, wheezing or rhonchi.  Abdominal:     General: Bowel sounds are normal.     Palpations: Abdomen is soft.     Tenderness: There is no abdominal tenderness. There is no guarding.  Musculoskeletal:     Cervical back: Neck supple.     Right lower leg: No tenderness.     Left lower leg: No tenderness.  Skin:    General: Skin is warm and dry.     Comments: Bilateral lower extremities with evidence of chronic venous stasis including woody like texture.  Neurological:     General: No focal deficit present.     Mental Status: He is alert and oriented to person, place, and time.  Psychiatric:        Mood and Affect: Mood normal.        Behavior: Behavior normal.    Data Reviewed: CBC with WBC of 4.8, hemoglobin of 9.3, MCV of 90 and platelets of 170 CMP with  potassium 4.3, chloride of 104, bicarb of 28, glucose of 109, BUN of 48, creatinine 2.18, calcium 8.2, albumin 2.8, total protein 6.3, and GFR calculated to be 30 BNP elevated at 1300 Troponin elevated at 22  EKG personally reviewed.  Atrial fibrillation with rate of 63 with a right bundle branch block compared to EKG obtained in 01/12/22, RBBB more pronounced.  DG Chest 2 View  Result Date: 05/29/2022 CLINICAL DATA:  Shortness of breath and ascites.  History of CHF. EXAM: CHEST - 2 VIEW COMPARISON:  09/16/2021 FINDINGS: Cardiac enlargement. Aortic atherosclerosis. Previous median sternotomy and CABG procedure. Bilateral pleural effusions, right greater than left. Mild interstitial edema. Atelectasis noted in both lung bases. No airspace opacities. IMPRESSION: 1. Mild congestive heart failure. 2. Bibasilar atelectasis. Electronically Signed   By: Kerby Moors M.D.   On: 05/29/2022 10:27    There are no new results to review at this time.  Assessment and Plan:  * Acute on chronic heart failure (Randall Vincent) Patient has a history of moderately reduced EF with last EF of 45-50%, follows with Dr. Clayborn Bigness.  Despite increasing doses of home torsemide, patient presents with marked pitting edema extending up to the mid abdomen.  - S/p Lasix 60 mg IV once - Start Lasix 40 mg IV twice daily - Strict in and out - Daily weights - Continue home GDMT - Echocardiogram ordered to reassess LVEF  Stage 3b chronic kidney disease (CKD) ([place]) Patient's renal function frequently fluctuates between 1.6 and 2.2.  Currently 2.18, previously 1.64.  I wonder if there is a component of cardiorenal syndrome.  Will monitor closely while diuresing.  - Daily BMP - Monitor urine output  Atrial fibrillation status post cardioversion 10/22/19 [facility]) Currently rate controlled.  -Continue home amiodarone, Eliquis, and Coreg  Venous stasis dermatitis of both lower extremities Per Dr. Etta Quill most recent note, plans were  to obtain ABIs.  Will do so at this time and if no evidence of PAD, plan to order Unna boots  -  ABIs pending - If negative, order Unna boots  Essential hypertension - Continue home antihypertensives  DM (diabetes mellitus), type 2 (HCC) Well-managed with semaglutide with last A1c of 5.9% 4 weeks ago.  - Hold home semaglutide while admitted - SSI, moderate  Advance Care Planning:   Code Status: DNR.  Patient states that in the event of cardiac arrest, he would want to be let go in peace.  In the case of pulmonary only arrest, he would want pulmonary resuscitation including intubation if indicated.  He would not want long-term life support if there was no chance of meaningful recovery though.  Consults: None  Family Communication: Patient's son updated at bedside  Severity of Illness: The appropriate patient status for this patient is OBSERVATION. Observation status is judged to be reasonable and necessary in order to provide the required intensity of service to ensure the patient's safety. The patient's presenting symptoms, physical exam findings, and initial radiographic and laboratory data in the context of their medical condition is felt to place them at decreased risk for further clinical deterioration. Furthermore, it is anticipated that the patient will be medically stable for discharge from the hospital within 2 midnights of admission.   Author: Verdene Lennert, MD 05/29/2022 2:25 PM  For on call review [email or web address].

## 2022-05-29 NOTE — Assessment & Plan Note (Addendum)
Patient's renal function frequently fluctuates between 1.6 and 2.2.  Currently 2.18, previously 1.64.  I wonder if there is a component of cardiorenal syndrome.  Will monitor closely while diuresing.  - Daily BMP - Monitor urine output

## 2022-05-29 NOTE — Assessment & Plan Note (Signed)
Currently rate controlled.  -Continue home amiodarone, Eliquis, and Coreg

## 2022-05-30 ENCOUNTER — Inpatient Hospital Stay (HOSPITAL_COMMUNITY)
Admit: 2022-05-30 | Discharge: 2022-05-30 | Disposition: A | Discharge status: Home / Self Care | Payer: Medicare Other | Attending: Internal Medicine | Admitting: Internal Medicine

## 2022-05-30 ENCOUNTER — Observation Stay: Payer: Medicare Other

## 2022-05-30 ENCOUNTER — Encounter: Payer: Self-pay | Admitting: Internal Medicine

## 2022-05-30 DIAGNOSIS — I5023 Acute on chronic systolic (congestive) heart failure: Secondary | ICD-10-CM

## 2022-05-30 DIAGNOSIS — I480 Paroxysmal atrial fibrillation: Secondary | ICD-10-CM | POA: Diagnosis present

## 2022-05-30 DIAGNOSIS — K922 Gastrointestinal hemorrhage, unspecified: Secondary | ICD-10-CM | POA: Diagnosis present

## 2022-05-30 DIAGNOSIS — I5021 Acute systolic (congestive) heart failure: Secondary | ICD-10-CM

## 2022-05-30 DIAGNOSIS — Z6835 Body mass index (BMI) 35.0-35.9, adult: Secondary | ICD-10-CM | POA: Diagnosis not present

## 2022-05-30 DIAGNOSIS — I4891 Unspecified atrial fibrillation: Secondary | ICD-10-CM | POA: Diagnosis not present

## 2022-05-30 DIAGNOSIS — I081 Rheumatic disorders of both mitral and tricuspid valves: Secondary | ICD-10-CM | POA: Diagnosis present

## 2022-05-30 DIAGNOSIS — N179 Acute kidney failure, unspecified: Secondary | ICD-10-CM | POA: Diagnosis not present

## 2022-05-30 DIAGNOSIS — I252 Old myocardial infarction: Secondary | ICD-10-CM | POA: Diagnosis not present

## 2022-05-30 DIAGNOSIS — I5082 Biventricular heart failure: Secondary | ICD-10-CM | POA: Diagnosis present

## 2022-05-30 DIAGNOSIS — E1122 Type 2 diabetes mellitus with diabetic chronic kidney disease: Secondary | ICD-10-CM | POA: Diagnosis present

## 2022-05-30 DIAGNOSIS — I959 Hypotension, unspecified: Secondary | ICD-10-CM | POA: Diagnosis not present

## 2022-05-30 DIAGNOSIS — N1832 Chronic kidney disease, stage 3b: Secondary | ICD-10-CM | POA: Diagnosis present

## 2022-05-30 DIAGNOSIS — Z515 Encounter for palliative care: Secondary | ICD-10-CM | POA: Diagnosis not present

## 2022-05-30 DIAGNOSIS — I130 Hypertensive heart and chronic kidney disease with heart failure and stage 1 through stage 4 chronic kidney disease, or unspecified chronic kidney disease: Secondary | ICD-10-CM | POA: Diagnosis present

## 2022-05-30 DIAGNOSIS — I5043 Acute on chronic combined systolic (congestive) and diastolic (congestive) heart failure: Secondary | ICD-10-CM | POA: Diagnosis present

## 2022-05-30 DIAGNOSIS — E559 Vitamin D deficiency, unspecified: Secondary | ICD-10-CM | POA: Diagnosis present

## 2022-05-30 DIAGNOSIS — I872 Venous insufficiency (chronic) (peripheral): Secondary | ICD-10-CM | POA: Diagnosis not present

## 2022-05-30 DIAGNOSIS — D500 Iron deficiency anemia secondary to blood loss (chronic): Secondary | ICD-10-CM | POA: Diagnosis present

## 2022-05-30 DIAGNOSIS — Z66 Do not resuscitate: Secondary | ICD-10-CM | POA: Diagnosis present

## 2022-05-30 DIAGNOSIS — D61818 Other pancytopenia: Secondary | ICD-10-CM | POA: Diagnosis present

## 2022-05-30 DIAGNOSIS — I2510 Atherosclerotic heart disease of native coronary artery without angina pectoris: Secondary | ICD-10-CM | POA: Diagnosis present

## 2022-05-30 DIAGNOSIS — I2489 Other forms of acute ischemic heart disease: Secondary | ICD-10-CM | POA: Diagnosis not present

## 2022-05-30 DIAGNOSIS — E785 Hyperlipidemia, unspecified: Secondary | ICD-10-CM | POA: Diagnosis present

## 2022-05-30 DIAGNOSIS — D631 Anemia in chronic kidney disease: Secondary | ICD-10-CM | POA: Diagnosis present

## 2022-05-30 DIAGNOSIS — I509 Heart failure, unspecified: Secondary | ICD-10-CM | POA: Diagnosis present

## 2022-05-30 DIAGNOSIS — J9811 Atelectasis: Secondary | ICD-10-CM | POA: Diagnosis present

## 2022-05-30 DIAGNOSIS — L89896 Pressure-induced deep tissue damage of other site: Secondary | ICD-10-CM | POA: Diagnosis not present

## 2022-05-30 DIAGNOSIS — I2729 Other secondary pulmonary hypertension: Secondary | ICD-10-CM | POA: Diagnosis present

## 2022-05-30 LAB — CBG MONITORING, ED
Glucose-Capillary: 88 mg/dL (ref 70–99)
Glucose-Capillary: 98 mg/dL (ref 70–99)
Glucose-Capillary: 94 mg/dL (ref 70–99)

## 2022-05-30 LAB — BASIC METABOLIC PANEL
Anion gap: 6 (ref 5–15)
BUN: 48 mg/dL — ABNORMAL HIGH (ref 8–23)
CO2: 29 mmol/L (ref 22–32)
Calcium: 7.7 mg/dL — ABNORMAL LOW (ref 8.9–10.3)
Chloride: 105 mmol/L (ref 98–111)
Creatinine, Ser: 2.37 mg/dL — ABNORMAL HIGH (ref 0.61–1.24)
GFR, Estimated: 27 mL/min — ABNORMAL LOW (ref >60)
Glucose, Bld: 101 mg/dL — ABNORMAL HIGH (ref 70–99)
Potassium: 4 mmol/L (ref 3.5–5.1)
Sodium: 140 mmol/L (ref 135–145)

## 2022-05-30 LAB — CBC WITH DIFFERENTIAL/PLATELET
Abs Immature Granulocytes: 0.02 10*3/uL (ref 0.00–0.07)
Basophils Absolute: 0 10*3/uL (ref 0.0–0.1)
Basophils Relative: 0 %
Eosinophils Absolute: 0.1 10*3/uL (ref 0.0–0.5)
Eosinophils Relative: 2 %
HCT: 26.8 % — ABNORMAL LOW (ref 39.0–52.0)
Hemoglobin: 7.9 g/dL — ABNORMAL LOW (ref 13.0–17.0)
Immature Granulocytes: 0 %
Lymphocytes Relative: 11 %
Lymphs Abs: 0.5 10*3/uL — ABNORMAL LOW (ref 0.7–4.0)
MCH: 26.9 pg (ref 26.0–34.0)
MCHC: 29.5 g/dL — ABNORMAL LOW (ref 30.0–36.0)
MCV: 91.2 fL (ref 80.0–100.0)
Monocytes Absolute: 0.5 10*3/uL (ref 0.1–1.0)
Monocytes Relative: 10 %
Neutro Abs: 3.6 10*3/uL (ref 1.7–7.7)
Neutrophils Relative %: 77 %
Platelets: 150 10*3/uL (ref 150–400)
RBC: 2.94 MIL/uL — ABNORMAL LOW (ref 4.22–5.81)
RDW: 18.5 % — ABNORMAL HIGH (ref 11.5–15.5)
WBC: 4.8 10*3/uL (ref 4.0–10.5)
nRBC: 0 % (ref 0.0–0.2)

## 2022-05-30 LAB — ECHOCARDIOGRAM COMPLETE
AR max vel: 3.04 cm2
AV Area VTI: 3.06 cm2
AV Area mean vel: 3.04 cm2
AV Mean grad: 3.5 mmHg
AV Peak grad: 6.5 mmHg
Ao pk vel: 1.28 m/s
Area-P 1/2: 4.06 cm2
Height: 68 in
MV M vel: 4.07 m/s
MV Peak grad: 66.3 mmHg
S' Lateral: 4.6 cm
Weight: 3697.6 oz

## 2022-05-30 LAB — TROPONIN I (HIGH SENSITIVITY): Troponin I (High Sensitivity): 23 ng/L — ABNORMAL HIGH (ref <18)

## 2022-05-30 LAB — MAGNESIUM: Magnesium: 2.2 mg/dL (ref 1.7–2.4)

## 2022-05-30 MED ORDER — FUROSEMIDE 10 MG/ML IJ SOLN
8.0000 mg/h | INTRAVENOUS | Status: DC
  Administered 2022-05-30 – 2022-06-03 (×6): 8 mg/h via INTRAVENOUS
  Filled 2022-05-30 (×7): qty 20

## 2022-05-30 MED ORDER — BENZONATATE 100 MG PO CAPS
100.0000 mg | ORAL_CAPSULE | Freq: Three times a day (TID) | ORAL | Status: DC | PRN
  Administered 2022-05-30 – 2022-06-04 (×8): 100 mg via ORAL
  Filled 2022-05-30 (×8): qty 1

## 2022-05-30 MED ORDER — CARVEDILOL 3.125 MG PO TABS
3.1250 mg | ORAL_TABLET | Freq: Two times a day (BID) | ORAL | Status: DC
  Administered 2022-05-30 – 2022-06-10 (×16): 3.125 mg via ORAL
  Filled 2022-05-30 (×22): qty 1

## 2022-05-30 NOTE — Evaluation (Signed)
Occupational Therapy Evaluation Patient Details Name: Randall Vincent MRN: [id number] DOB: 09-05-1940 Today's Date: 05/30/2022   History of Present Illness Patient is an 82 year old male who presents to ED for SOB. Patient has had gradually worsening LE swelling that reached to his abdomen. 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   Clinical Impression   Patient presenting with decreased independence in self care, balance, functional mobility/transfers, and endurance. PTA pt lived alone, was Mod I for ADLs, and still drives. Pt currently functioning at Min-Mod A to stand from toilet in ED 2/2 lower transfer surface, Max A for LB dressing, Min guard for clothing management in standing, and supervision for peri care in sitting. Pt normally uses AE to complete LB dressing at home. Pt will benefit from acute OT to increase overall independence in the areas of ADLs and functional mobility in order to safely discharge home. Anticipate pt to make necessary progress with ADL tasks while in hospital to not require formal f/u therapy at D/C.  Pt found sitting on toilet on RA. Once pt was back in bed & OT re-connected O2 monitor, SpO2 reading 85%. Pt placed back on 2L O2 via [place] and improving to 96% with time and VC for PLB.    Recommendations for follow up therapy are one component of a multi-disciplinary discharge planning process, led by the attending physician.  Recommendations may be updated based on patient status, additional functional criteria and insurance authorization.   Follow Up Recommendations  No OT follow up     Assistance Recommended at Discharge PRN  Patient can return home with the following Assistance with cooking/housework;Assist for transportation;Help with stairs or ramp for entrance    Functional Status Assessment  Patient has had a recent decline in their functional status and demonstrates the ability to make significant  improvements in function in a reasonable and predictable amount of time.  Equipment Recommendations  None recommended by OT    Recommendations for Other Services       Precautions / Restrictions Precautions Precautions: Fall Restrictions Weight Bearing Restrictions: No      Mobility Bed Mobility Overal bed mobility: Modified Independent                  Transfers Overall transfer level: Needs assistance Equipment used: 1 person hand held assist Transfers: Sit to/from Stand Sit to Stand: Min assist, Mod assist           General transfer comment: from toilet in ED 2/2 lower transfer surface      Balance Overall balance assessment: Needs assistance Sitting-balance support: No upper extremity supported, Feet supported Sitting balance-Leahy Scale: Good     Standing balance support: Single extremity supported, During functional activity Standing balance-Leahy Scale: Fair Standing balance comment: SUE support during standing and functional mobility, deferred use of RW                           ADL either performed or assessed with clinical judgement   ADL Overall ADL's : Needs assistance/impaired                     Lower Body Dressing: Maximal assistance;Sitting/lateral leans Lower Body Dressing Details (indicate cue type and reason): to don/doff socks while sitting on toilet Toilet Transfer: Regular Toilet;Grab bars;Minimal assistance;Moderate assistance Toilet Transfer Details (indicate cue type and reason): Min-Mod  A to stand from toilet 2/2 lower transfer surface Toileting- Clothing Manipulation and Hygiene: Supervision/safety;Sitting/lateral lean;Sit to/from stand;Min guard Toileting - Clothing Manipulation Details (indicate cue type and reason): peri care in sitting with supervision, Min guard for safety while hiking underwear/pants in standing     Functional mobility during ADLs: Min guard (from toilet to EOB (~15 ft), Min guard for  safety, 1 person HHA, deferred use of RW) General ADL Comments: Pt normally uses long handled shoe horn for LB dressing, also has reacher     Vision Baseline Vision/History: 1 Wears glasses Patient Visual Report: No change from baseline       Perception     Praxis      Pertinent Vitals/Pain Pain Assessment Pain Assessment: Faces Faces Pain Scale: Hurts a little bit Pain Location: BLEs Pain Descriptors / Indicators: Sore Pain Intervention(s): Monitored during session     Hand Dominance Right   Extremity/Trunk Assessment Upper Extremity Assessment Upper Extremity Assessment: Generalized weakness   Lower Extremity Assessment Lower Extremity Assessment: Generalized weakness   Cervical / Trunk Assessment Cervical / Trunk Assessment: Normal   Communication Communication Communication: No difficulties   Cognition Arousal/Alertness: Awake/alert Behavior During Therapy: WFL for tasks assessed/performed Overall Cognitive Status: Within Functional Limits for tasks assessed                                 General Comments: A&Ox4     General Comments  Pt found sitting on toilet on RA. Once pt was back in bed & OT re-connected O2 monitor, SpO2 reading 85%. Pt placed back on 2L O2 via [place] and improving to 96% with time and VC for PLB.    Exercises Other Exercises Other Exercises: OT provided education re: role of OT, OT POC, post acute recs, sitting up for all meals, EOB/OOB mobility with assistance, home/fall safety, pursed lip breathing, energy conservation techniques   Shoulder Instructions      Home Living Family/patient expects to be discharged to:: Private residence Living Arrangements: Alone Available Help at Discharge: Family;Available 24 hours/day Type of Home: Other(Comment) (camper) Home Access: Stairs to enter Entrance Stairs-Number of Steps: 5 Entrance Stairs-Rails: Left Home Layout: One level     Bathroom Shower/Tub: Scientist, research (life sciences): Handicapped height     Home Equipment: Cane - single point;Rollator (4 wheels);Adaptive equipment Adaptive Equipment: Reacher;Long-handled shoe horn Additional Comments: Lives in a camper in his son's backyard, reported shower is not big enough for shower chair, has suction grab bars      Prior Functioning/Environment Prior Level of Function : Independent/Modified Independent;Driving;History of Falls (last six months)             Mobility Comments: Independent community ambulator. Lives in a camper next to son's home.  He furniture surfs while ambulating. ADLs Comments: Mod I ADLs (uses AE PRN), drives and does his own shopping. Does occasionally go to son's home for some meals.        OT Problem List: Decreased strength;Decreased activity tolerance;Impaired balance (sitting and/or standing);Cardiopulmonary status limiting activity;Pain      OT Treatment/Interventions: Self-care/ADL training;Therapeutic exercise;Therapeutic activities;Energy conservation;Patient/family education;DME and/or AE instruction;Balance training    OT Goals(Current goals can be found in the care plan section) Acute Rehab OT Goals Patient Stated Goal: return home OT Goal Formulation: With patient Time For Goal Achievement: 06/13/22 Potential to Achieve Goals: Good   OT Frequency: Min 2X/week  Co-evaluation              AM-PAC OT "6 Clicks" Daily Activity     Outcome Measure Help from another person eating meals?: None Help from another person taking care of personal grooming?: None Help from another person toileting, which includes using toliet, bedpan, or urinal?: A Lot Help from another person bathing (including washing, rinsing, drying)?: A Lot Help from another person to put on and taking off regular upper body clothing?: None Help from another person to put on and taking off regular lower body clothing?: A Lot 6 Click Score: 18   End of Session Equipment Utilized  During Treatment: Gait belt;Oxygen Nurse Communication: Mobility status  Activity Tolerance: Patient tolerated treatment well Patient left: in bed;with call bell/phone within reach;with bed alarm set  OT Visit Diagnosis: Unsteadiness on feet (R26.81);Muscle weakness (generalized) (M62.81)                Time: 3532-9924 OT Time Calculation (min): 16 min Charges:  OT General Charges $OT Visit: 1 Visit OT Evaluation $OT Eval Low Complexity: 1 Low  [facility] MS, OTR/L ascom [phone]  05/30/22, 1:20 PM

## 2022-05-30 NOTE — ED Notes (Signed)
Pt assisted into hospital bed

## 2022-05-30 NOTE — Consult Note (Signed)
   Heart Failure Nurse Navigator Note  HFmrEF 45-50%. Mild to moderate mitral regurgitation.  Moderate Tricuspid regurgitation.   He presented to the emergency room with complaints of worsening shortness of breath, lower extremity edema that extends to his abdomen and scrotum.  He also noted early satiety.  BNP 1300.  Chest x-ray showed interstitial edema with mild bilateral pleural effusions.  Comorbidities:  Coronary artery disease/coronary artery bypass grafting Atrial fibrillation Chronic kidney disease stage III Hypertension Hyperlipidemia Diabetes   Medications:  Amiodarone 200 mg daily Apixaban 2.5 mg 2 times a day Atorvastatin 40 mg daily Furosemide infusion at 8 mg an hour Ramipril 10 mg daily Coreg 3.125 mg 2 times a day  Labs:  Sodium 140, potassium 4, chloride 105, CO2 29, BUN 48, creatinine 2.37, GFR 27. Weight 104 kg Blood pressure is  120/70    Meeting with patient in the ED he had just returned from ultrasound.  He states that his breathing is somewhat improved but is still very swollen from the abdomen down.  Went over how it takes care of himself at home.  He admits to not weighing daily as" he had become very complacent with weight remaining steady."  Went over weighing daily and reporting 2 pound weight gain overnight or total of 5 pounds in a week also discussed changes in symptoms such as the lower extremity edema, PND and orthopnea etc.  He voices understanding.  He states the last 2 to 3 weeks he had not been eating with his son and had been preparing his own meals which were processed and convenience meals such as frozen pot pies, [employer] macaroni and cheese and canned soups, consuming the whole can at the meal.  Discussed how much sodium content is in foods that he was eating, again stressed 2000 mg daily.  He had been on a 64 ounce fluid restriction, he states the doctor now is putting him on 48 ounces daily.  He has follow up in the outpatient  heart failure clinic on January 22 at 2 PM.  He had no further questions and will continue to follow.  Pricilla Riffle RN [facility]

## 2022-05-30 NOTE — Discharge Instructions (Signed)

## 2022-05-30 NOTE — ED Notes (Addendum)
Messaged NP to notify that Hgb and RBC are trending downward.  NP acknowledged.

## 2022-05-30 NOTE — Progress Notes (Addendum)
[facility] Progress Note  Patient: Randall Vincent    UDJ:[id number]  DOA: 05/29/2022     Date of Service: the patient was seen and examined on 05/30/2022  Chief Complaint  Patient presents with   Shortness of Breath   Brief hospital course: Randall Vincent is a 82 y.o. 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 ED w/up: Chest x-ray was obtained that demonstrated interstitial edema, mild, with bilateral pleural effusions. Due to failure of outpatient treatment, [facility] contacted for admission for acute on chronic heart failure.    Assessment and Plan:  # Acute on chronic systolic heart failure  Patient has a history of moderately reduced EF with last EF of 45-50%, follows with Dr. Juliann Pares.  Despite increasing doses of home torsemide, patient presents with marked pitting edema extending up to the mid abdomen. - S/p Lasix 60 mg IV once and  Lasix 40 mg IV BID 1/8 started Lasix IV infusion anasarca - Strict in and out - Daily weights - Continue home GDMT - Echocardiogram ordered to reassess LVEF    Stage 3b chronic kidney disease (CKD) Patient's renal function frequently fluctuates between 1.6 and 2.2.  Currently 2.18, previously 1.64.  I wonder if there is a component of cardiorenal syndrome.  Will monitor closely while diuresing. - Daily BMP - Monitor urine output   Atrial fibrillation status post cardioversion 10/22/19  Currently rate controlled. Continue home amiodarone, Eliquis, and Coreg   Venous stasis dermatitis of both lower extremities Per Dr. Glennis Brink most recent note, plans were to obtain ABIs.  Will do so at this time and if no evidence of PAD, plan to order Unna boots - ABIs pending - If negative, order Unna boots   Essential  hypertension - Continue home antihypertensives   DM (diabetes mellitus), type 2 (HCC) Well-managed with semaglutide with last A1c of 5.9% 4 weeks ago. - Hold home semaglutide while admitted - SSI, moderate   Body mass index is 35.14 kg/m.  Interventions:     Diet: Heart healthy diet, fluid striction 1.5 L/day DVT Prophylaxis: Therapeutic Anticoagulation with Eliquis    Advance goals of care discussion: DNR  Family Communication: family was not present at bedside, at the time of interview.  The pt provided permission to discuss medical plan with the family. Opportunity was given to ask question and all questions were answered satisfactorily.   Disposition:  Pt is from Home, admitted with volume overload on IV Lasix infusion, still has anasarca, which precludes a safe discharge. Discharge to home, when clinically stable, may need 3 to 5 days to stay.  Subjective: No significant overnight events, patient was admitted due to anasarca, still having shortness of breath on oxygen, feels improvement, denies any chest pain or palpitations.  Physical Exam: General: NAD, lying comfortably, mild SOB Appear in no distress, affect appropriate Eyes: Right eye PERRLA, (left eye blind since childhood) ENT: Oral Mucosa Clear, moist  Neck: no JVD,  Cardiovascular: S1 and S2 Present, no Murmur,  Respiratory: good respiratory effort, Bilateral Air entry equal and Decreased, mild bilateral crackles, no wheezes Abdomen: Bowel Sound present, Soft and no tenderness, edematous abdominal wall Skin: no rashes Extremities: 3-4 + Pedal edema, no calf tenderness Neurologic: without any new focal findings Gait not checked due to patient  safety concerns  Vitals:   05/30/22 1300 05/30/22 1400 05/30/22 1500 05/30/22 1510  BP: 120/70 (!) 106/51 (!) 124/47   Pulse: 67 (!) 55 (!) 55   Resp: (!) 26 20 (!) 25   Temp:    97.6 F (36.4 C)  TempSrc:    Oral  SpO2: 100% 93% 96%   Weight:      Height:         Intake/Output Summary (Last 24 hours) at 05/30/2022 1600 Last data filed at 05/30/2022 1402 Gross per 24 hour  Intake 706.32 ml  Output 950 ml  Net -243.68 ml   Filed Weights   05/29/22 0945 05/29/22 1244  Weight: 97.5 kg 104.8 kg    Data Reviewed: I have personally reviewed and interpreted daily labs, tele strips, imagings as discussed above. I reviewed all nursing notes, pharmacy notes, vitals, pertinent old records I have discussed plan of care as described above with RN and patient/family.  CBC: Recent Labs  Lab 05/29/22 0949 05/30/22 0225  WBC 4.8 4.8  NEUTROABS  --  3.6  HGB 9.3* 7.9*  HCT 31.8* 26.8*  MCV 90.9 91.2  PLT 170 409   Basic Metabolic Panel: Recent Labs  Lab 05/29/22 0949 05/30/22 0225  NA 140 140  K 4.3 4.0  CL 104 105  CO2 28 29  GLUCOSE 109* 101*  BUN 48* 48*  CREATININE 2.18* 2.37*  CALCIUM 8.2* 7.7*  MG 2.4 2.2    Studies: US ARTERIAL ABI (SCREENING LOWER EXTREMITY)  Result Date: 05/30/2022 CLINICAL DATA:  82 year old male with a history of bilateral lower extremity edema EXAM: NONINVASIVE PHYSIOLOGIC VASCULAR STUDY OF BILATERAL LOWER EXTREMITIES TECHNIQUE: Evaluation of both lower extremities was performed at rest, including calculation of ankle-brachial indices, multiple segmental pressure evaluation, segmental Doppler and segmental pulse volume recording. COMPARISON:  None Available. FINDINGS: Right ABI:  1.47 Left ABI:  1.55 Right Lower Extremity: Segmental Doppler at the right ankle demonstrates monophasic waveforms Left Lower Extremity: Segmental Doppler at the left ankle demonstrates triphasic posterior tibial and dorsalis pedis waveform. IMPRESSION: Right: Resting ABI is likely falsely elevated secondary to noncompressible vessels, given the segmental Doppler. Segmental Doppler demonstrates monophasic waveforms at the right ankle, indicating more proximal occlusive disease. Left: Resting ABI likely falsely elevated secondary to  noncompressible vessels. Segmental Doppler at the left ankle demonstrates waveforms relatively maintained. Signed, Dulcy Fanny. Nadene Rubins, RPVI [facility] Electronically Signed   By: Corrie Mckusick D.O.   On: 05/30/2022 10:56    Scheduled Meds:  amiodarone  200 mg Oral Daily   apixaban  2.5 mg Oral BID   atorvastatin  40 mg Oral QPM   carvedilol  3.125 mg Oral BID   insulin aspart  0-15 Units Subcutaneous TID WC   ramipril  10 mg Oral Daily   sodium chloride flush  3 mL Intravenous Q12H   Continuous Infusions:  sodium chloride     furosemide (LASIX) 200 mg in dextrose 5 % 100 mL (2 mg/mL) infusion 8 mg/hr (05/30/22 1402)   PRN Meds: sodium chloride, acetaminophen, benzonatate, ondansetron (ZOFRAN) IV, sodium chloride flush  Time spent: 50 minutes  Author: Val Riles. MD [facility] 05/30/2022 4:00 PM  To reach On-call, see care teams to locate the attending and reach out to them via [email or web address]. If 7PM-7AM, please contact night-coverage If you still have difficulty reaching the attending provider, please page the [facility] (Director on Call) for [facility] on amion for assistance.

## 2022-05-30 NOTE — Progress Notes (Signed)
Echocardiogram 2D Echocardiogram has been performed.  Randall Vincent 05/30/2022, 2:52 PM

## 2022-05-31 DIAGNOSIS — I5023 Acute on chronic systolic (congestive) heart failure: Secondary | ICD-10-CM | POA: Diagnosis not present

## 2022-05-31 LAB — CBC
HCT: 30.4 % — ABNORMAL LOW (ref 39.0–52.0)
Hemoglobin: 8.8 g/dL — ABNORMAL LOW (ref 13.0–17.0)
MCH: 26.8 pg (ref 26.0–34.0)
MCHC: 28.9 g/dL — ABNORMAL LOW (ref 30.0–36.0)
MCV: 92.7 fL (ref 80.0–100.0)
Platelets: 164 10*3/uL (ref 150–400)
RBC: 3.28 MIL/uL — ABNORMAL LOW (ref 4.22–5.81)
RDW: 18.7 % — ABNORMAL HIGH (ref 11.5–15.5)
WBC: 6 10*3/uL (ref 4.0–10.5)
nRBC: 0 % (ref 0.0–0.2)

## 2022-05-31 LAB — BASIC METABOLIC PANEL
Anion gap: 7 (ref 5–15)
BUN: 46 mg/dL — ABNORMAL HIGH (ref 8–23)
CO2: 28 mmol/L (ref 22–32)
Calcium: 7.8 mg/dL — ABNORMAL LOW (ref 8.9–10.3)
Chloride: 103 mmol/L (ref 98–111)
Creatinine, Ser: 2.26 mg/dL — ABNORMAL HIGH (ref 0.61–1.24)
GFR, Estimated: 29 mL/min — ABNORMAL LOW (ref >60)
Glucose, Bld: 97 mg/dL (ref 70–99)
Potassium: 4.5 mmol/L (ref 3.5–5.1)
Sodium: 138 mmol/L (ref 135–145)

## 2022-05-31 LAB — CBG MONITORING, ED
Glucose-Capillary: 91 mg/dL (ref 70–99)
Glucose-Capillary: 120 mg/dL — ABNORMAL HIGH (ref 70–99)

## 2022-05-31 LAB — VITAMIN D 25 HYDROXY (VIT D DEFICIENCY, FRACTURES): Vit D, 25-Hydroxy: 18.7 ng/mL — ABNORMAL LOW (ref 30–100)

## 2022-05-31 LAB — MAGNESIUM: Magnesium: 2.4 mg/dL (ref 1.7–2.4)

## 2022-05-31 LAB — PHOSPHORUS: Phosphorus: 4.6 mg/dL (ref 2.5–4.6)

## 2022-05-31 LAB — GLUCOSE, CAPILLARY: Glucose-Capillary: 101 mg/dL — ABNORMAL HIGH (ref 70–99)

## 2022-05-31 NOTE — Progress Notes (Signed)
Mobility Specialist - Progress Note     05/31/22 1237  Mobility  Activity Ambulated with assistance in hallway;Stood at bedside  Level of Assistance Standby assist, set-up cues, supervision of patient - no hands on  Assistive Device Front wheel walker  Distance Ambulated (ft) 60 ft  Activity Response Tolerated well  Mobility Referral Yes  $Mobility charge 1 Mobility   Pt resting in bed on 2L upon entry. Pt STS and ambulates to hallway around NS SBA. Pt return to bed and left with needs in reach.   Loma Sender Mobility Specialist 05/31/22, 12:38 PM

## 2022-05-31 NOTE — Progress Notes (Signed)
[facility] Progress Note  Patient: Randall Vincent    ZOX:[id number]  DOA: 05/29/2022     Date of Service: the patient was seen and examined on 05/31/2022  Chief Complaint  Patient presents with   Shortness of Breath   Brief hospital course: Randall Vincent is a 82 y.o. 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 ED w/up: Chest x-ray was obtained that demonstrated interstitial edema, mild, with bilateral pleural effusions. Due to failure of outpatient treatment, [facility] contacted for admission for acute on chronic heart failure.    Assessment and Plan:  # Acute on chronic systolic heart failure  Patient has a history of moderately reduced EF with last EF of 45-50%, follows with Dr. Juliann Pares.  Despite increasing doses of home torsemide, patient presents with marked pitting edema extending up to the mid abdomen. - S/p Lasix 60 mg IV once and  Lasix 40 mg IV BID 1/8 started Lasix IV infusion anasarca - Strict in and out - Daily weights - Continue home GDMT TTE shows LVEF 30 to 35%, grade 3 diastolic dysfunction, right ventricular failure, moderate pulm hypertension, moderate MR and moderate to severe [facility] Cardiology consulted for further recommendation.     Stage 3b chronic kidney disease (CKD) Patient's renal function frequently fluctuates between 1.6 and 2.2.  Currently 2.18, previously 1.64.  I wonder if there is a component of cardiorenal syndrome.  Will monitor closely while diuresing. - Daily BMP - Monitor urine output   Atrial fibrillation status post cardioversion 10/22/19  Currently rate controlled. Continue home amiodarone, Eliquis, and Coreg   Venous stasis dermatitis of both lower extremities Per Dr. Glennis Brink most recent note, plans  were to obtain ABIs.  Will do so at this time and if no evidence of PAD, plan to order Unna boots - ABIs pending - If negative, order Unna boots   Essential hypertension - Continue home antihypertensives   DM (diabetes mellitus), type 2 (HCC) Well-managed with semaglutide with last A1c of 5.9% 4 weeks ago. - Hold home semaglutide while admitted - SSI, moderate  PVD ABI:  Right ABI:  1.47 and Left ABI:  1.55 Right Lower Extremity: Segmental Doppler at the right ankle demonstrates monophasic waveforms Left Lower Extremity: Segmental Doppler at the left ankle demonstrates triphasic posterior tibial and dorsalis pedis waveform.    Body mass index is 35.14 kg/m.  Interventions:     Diet: Heart healthy diet, fluid striction 1.5 L/day DVT Prophylaxis: Therapeutic Anticoagulation with Eliquis    Advance goals of care discussion: DNR  Family Communication: family was not present at bedside, at the time of interview.  The pt provided permission to discuss medical plan with the family. Opportunity was given to ask question and all questions were answered satisfactorily.   Disposition:  Pt is from Home, admitted with volume overload on IV Lasix infusion, still has anasarca, which precludes a safe discharge. Discharge to home, when clinically stable, may need 3 to 5 days to stay.  Subjective: No significant overnight events, patient is diuresing well, making urine, denies any worsening of symptoms, no chest pain.  Stated that breathing is better with oxygen.  No any other active issues.    Physical Exam: General: NAD, lying comfortably, mild SOB Appear in no distress, affect appropriate Eyes:  Right eye PERRLA, (left eye blind since childhood) ENT: Oral Mucosa Clear, moist  Neck: no JVD,  Cardiovascular: S1 and S2 Present, no Murmur,  Respiratory: good respiratory effort, Bilateral Air entry equal and Decreased, mild bilateral crackles, no wheezes Abdomen: Bowel Sound present, Soft  and no tenderness, edematous abdominal wall Skin: no rashes Extremities: 3-4 + Pedal edema, no calf tenderness, chronic venous stasis pigmentation Neurologic: without any new focal findings Gait not checked due to patient safety concerns  Vitals:   05/31/22 0409 05/31/22 0700 05/31/22 1238 05/31/22 1239  BP:  130/61 122/65   Pulse:  72 73   Resp:  20 19   Temp: 98.1 F (36.7 C)   98.2 F (36.8 C)  TempSrc: Oral   Oral  SpO2:  100% 99%   Weight:      Height:        Intake/Output Summary (Last 24 hours) at 05/31/2022 1515 Last data filed at 05/30/2022 1905 Gross per 24 hour  Intake 19.71 ml  Output 350 ml  Net -330.29 ml   Filed Weights   05/29/22 0945 05/29/22 1244  Weight: 97.5 kg 104.8 kg    Data Reviewed: I have personally reviewed and interpreted daily labs, tele strips, imagings as discussed above. I reviewed all nursing notes, pharmacy notes, vitals, pertinent old records I have discussed plan of care as described above with RN and patient/family.  CBC: Recent Labs  Lab 05/29/22 0949 05/30/22 0225 05/31/22 0500  WBC 4.8 4.8 6.0  NEUTROABS  --  3.6  --   HGB 9.3* 7.9* 8.8*  HCT 31.8* 26.8* 30.4*  MCV 90.9 91.2 92.7  PLT 170 150 932   Basic Metabolic Panel: Recent Labs  Lab 05/29/22 0949 05/30/22 0225 05/31/22 0500  NA 140 140 138  K 4.3 4.0 4.5  CL 104 105 103  CO2 28 29 28   GLUCOSE 109* 101* 97  BUN 48* 48* 46*  CREATININE 2.18* 2.37* 2.26*  CALCIUM 8.2* 7.7* 7.8*  MG 2.4 2.2 2.4  PHOS  --   --  4.6    Studies: No results found.  Scheduled Meds:  amiodarone  200 mg Oral Daily   apixaban  2.5 mg Oral BID   atorvastatin  40 mg Oral QPM   carvedilol  3.125 mg Oral BID   insulin aspart  0-15 Units Subcutaneous TID WC   Continuous Infusions:  furosemide (LASIX) 200 mg in dextrose 5 % 100 mL (2 mg/mL) infusion 8 mg/hr (05/31/22 1407)   PRN Meds: acetaminophen, benzonatate, ondansetron (ZOFRAN) IV  Time spent: 50 minutes  Author: Val Riles. MD [facility] 05/31/2022 3:15 PM  To reach On-call, see care teams to locate the attending and reach out to them via [email or web address]. If 7PM-7AM, please contact night-coverage If you still have difficulty reaching the attending provider, please page the [facility] (Director on Call) for [facility] on amion for assistance.

## 2022-05-31 NOTE — ED Notes (Signed)
Pure wick applied. Lasix gtt completed and a new one was requested from pharmacy. Pt comfortable in the bed. Resp. Even and nonlabored. Cont. Cardiac monitor.

## 2022-05-31 NOTE — Consult Note (Signed)
[facility] CARDIOLOGY CONSULT NOTE       Patient ID: Randall Vincent MRN: [id number] DOB/AGE: [birth year] 82 y.o.  Admit date: 05/29/2022 Referring Physician Dr. Gillis Santa Primary Physician Stephens November, PA-C [facility])  Primary Cardiologist Dr. Juliann Pares Reason for Consultation AoCHF  HPI: Randall Vincent is a 81yoM with a PMH of CAD s/p CABG x2 1991 and BMS, paroxysmal AF s/p DCCV 2021 on Eliquis, HFpEF (LVEF 45-50%, mi-mod MR, mod TR 08/2021), HTN, DM2, CKD3, who presented to [facility] ED 05/29/22 with worsening shortness of breath and peripheral edema extending up to his thighs and abdomen. Cardiology is consulted on hospital day 2 for further assistance with his HF. Echo this admission revealed a worsening EF of 30-35%, g3DD, and mod reduced LV function, severely reduced RV function  The patient states that he has had several days of worsening peripheral edema extending up to his thighs and up into the sides of his abdomen and suprapubic area.  He usually takes torsemide 20 mg twice a day, but took an extra pill the day before he presented to the ER without relief.  For the past 2 to 3 weeks he has been preparing his own meals at home mostly consisting of frozen dinners and [place] soup while also adding "a little" salt to his other meals.  He usually eats with his son and other family, but they have been sick so he has not had much contact with them for this reason.  In the emergency department he was given Lasix 60 x 1 IV, and 40 mg IV x 2 with some clinical improvement, then started on a continuous Lasix infusion at 8 mg/h.  He had an echo performed yesterday that revealed a reduction in his ejection fraction from 45-50% 9 months ago down to 30-35%.  At my time of evaluation he says that he feels better in terms of his breathing, but his peripheral and lower abdominal edema remains present.  He denies chest pain, heart racing, palpitations.  He has chronic orthopnea and sleeps in a  recliner.  He is on supplemental oxygen, with no baseline requirement.  Recent vitals are notable for a blood pressure of 130/61, he is in sinus rhythm in the 60s-70s on telemetry.  He is saturating at 100% on 3 L by [place].  His I/O's are in net -821 mL.  Labs notable for an AKI with initial BUN/creatinine 48/2.18 and GFR of 30, with slight worsening today at 46/2.26 and GFR of 29.  Potassium is 4.5, magnesium 2.4, BNP elevated on admission at 1349, high-sensitivity troponin with minimal elevation flat trend 22, 23.  Chronic anemia with H&H today 8.8/30.4, and platelets 164.  Chest x-ray with right >left pleural effusions with mild interstitial edema.  Review of systems complete and found to be negative unless listed above     Past Medical History:  Diagnosis Date   A-fib [facility])    a.) CHA2DS2-VASc = 6 (age x 2, CHF, HTN, previous MI, T2DM). b.) Rate/rhythm maintained on amiodarone + diltiazem + carvedilol; chronically anticoagulated using dabigatran. c.) DCCV 07/10/2015, 08/08/2019, 10/22/2019.   Arthritis    Chest pain 06/26/2019   CHF (congestive heart failure) (HCC)    CKD (chronic kidney disease), stage III (HCC)    Coronary artery disease 1991   a.) 2v CABG in 1991. b.) PCI 06/07/2006 --> Vision stents to native LCx and RCA   Hyperlipidemia    Hypertension    Long term current use of anticoagulant  a.) dabigatran   Myocardial infarction (HCC) 1991   a.) LHC --> 50% and 90% LAD lesions; referred to CVTS. 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   a.) LIMA-LAD, SVG-RCA   T2DM (type 2 diabetes mellitus) (HCC)     Past Surgical History:  Procedure Laterality Date   BACK SURGERY  1970   L5-L6 ruptured disk   BLADDER SURGERY  2000   CARDIOVERSION N/A 07/10/2015   CARDIOVERSION N/A 08/08/2019   CARDIOVERSION N/A 10/22/2019   COLON SURGERY     COLONOSCOPY N/A 01/15/2020   Procedure: COLONOSCOPY;  Surgeon: Pasty Spillers, MD;   Location: [facility] ENDOSCOPY;  Service: Endoscopy;  Laterality: N/A;   COLONOSCOPY N/A 01/16/2020   Procedure: COLONOSCOPY;  Surgeon: Pasty Spillers, MD;  Location: [facility] ENDOSCOPY;  Service: Endoscopy;  Laterality: N/A;   CORONARY ARTERY BYPASS GRAFT N/A 01/02/1990   Procedure: 2v CORONARY ARTERY BYPASS GRAFT (LIMA-LAD, SVG-RCA)   ESOPHAGOGASTRODUODENOSCOPY N/A 01/13/2020   Procedure: ESOPHAGOGASTRODUODENOSCOPY (EGD);  Surgeon: Pasty Spillers, MD;  Location: [facility] ENDOSCOPY;  Service: Endoscopy;  Laterality: N/A;   REPLACEMENT TOTAL KNEE BILATERAL Bilateral 2008   STENT PLACEMENT VASCULAR ([facility] HX)  2008   TOTAL KNEE REVISION Left 01/16/2020   Procedure: TOTAL KNEE REVISION;  Surgeon: Lyndle Herrlich, MD;  Location: [facility] ORS;  Service: Orthopedics;  Laterality: Left;   TOTAL KNEE REVISION Left 05/31/2021   Procedure: TOTAL KNEE REVISION;  Surgeon: Lyndle Herrlich, MD;  Location: [facility] ORS;  Service: Orthopedics;  Laterality: Left;    (Not in a hospital admission)  Social History   Socioeconomic History   Marital status: Widowed    Spouse name: Not on file   Number of children: Not on file   Years of education: Not on file   Highest education level: Not on file  Occupational History   Not on file  Tobacco Use   Smoking status: Former    Types: Cigarettes    Quit date: 21    Years since quitting: 33.0   Smokeless tobacco: Never  Vaping Use   Vaping Use: Never used  Substance and Sexual Activity   Alcohol use: Not Currently    Alcohol/week: 1.0 standard drink of alcohol    Types: 1 Cans of beer per week    Comment: very rarely   Drug use: Never   Sexual activity: Not on file  Other Topics Concern   Not on file  Social History Narrative   Not on file   Social Determinants of Health   Financial Resource Strain: Not on file  Food Insecurity: No Food Insecurity (05/30/2022)   Hunger Vital Sign    Worried About Running Out of Food in the Last Year: Never true    Ran  Out of Food in the Last Year: Never true  Transportation Needs: No Transportation Needs (05/30/2022)   PRAPARE - Administrator, Civil Service (Medical): No    Lack of Transportation (Non-Medical): No  Physical Activity: Not on file  Stress: Not on file  Social Connections: Not on file  Intimate Partner Violence: Not At Risk (05/30/2022)   Humiliation, Afraid, Rape, and Kick questionnaire    Fear of Current or Ex-Partner: No    Emotionally Abused: No    Physically Abused: No    Sexually Abused: No    History reviewed. No pertinent family history.    Intake/Output Summary (Last 24 hours) at  05/31/2022 0902 Last data filed at 05/30/2022 1905 Gross per 24 hour  Intake 276.03 ml  Output 350 ml  Net -73.97 ml    Vitals:   05/31/22 0300 05/31/22 0400 05/31/22 0409 05/31/22 0700  BP: (!) 133/50 (!) 109/48  130/61  Pulse: 67 69  72  Resp: 18 (!) 21  20  Temp:   98.1 F (36.7 C)   TempSrc:   Oral   SpO2: 95% 99%  100%  Weight:      Height:        PHYSICAL EXAM General: elderly and ill appearing caucasian male , well nourished, in no acute distress. Sitting upright in hospital bed in the ED.  HEENT:  Normocephalic and atraumatic. Neck:  No JVD.  Lungs: slight conversational dyspnea on O2 by [place]. Decreased breath sounds with right basilar crackles Heart: HRRR . Normal S1 and S2 without gallops or murmurs.  Abdomen: mildly distended appearing with pitting edema on both lower flanks and along pannus with mild erythema.  Msk: Normal strength and tone for age. Extremities: chronic venous stasis hyperpigmentation with RLE crusted nickle sized area of crusting. 1+ edema bilaterally  Neuro: Alert and oriented X 3. Psych:  Answers questions appropriately.   Labs: Basic Metabolic Panel: Recent Labs    05/30/22 0225 05/31/22 0500  NA 140 138  K 4.0 4.5  CL 105 103  CO2 29 28  GLUCOSE 101* 97  BUN 48* 46*  CREATININE 2.37* 2.26*  CALCIUM 7.7* 7.8*  MG 2.2 2.4  PHOS  --   4.6   Liver Function Tests: Recent Labs    05/29/22 0949  AST 25  ALT 17  ALKPHOS 102  BILITOT 1.2  PROT 6.3*  ALBUMIN 2.8*   No results for input(s): "LIPASE", "AMYLASE" in the last 72 hours. CBC: Recent Labs    05/30/22 0225 05/31/22 0500  WBC 4.8 6.0  NEUTROABS 3.6  --   HGB 7.9* 8.8*  HCT 26.8* 30.4*  MCV 91.2 92.7  PLT 150 164   Cardiac Enzymes: Recent Labs    05/29/22 0949 05/30/22 0225  TROPONINIHS 22* 23*   BNP: Recent Labs    05/29/22 0949  BNP 1,349.1*   D-Dimer: No results for input(s): "DDIMER" in the last 72 hours. Hemoglobin A1C: No results for input(s): "HGBA1C" in the last 72 hours. Fasting Lipid Panel: No results for input(s): "CHOL", "HDL", "LDLCALC", "TRIG", "CHOLHDL", "LDLDIRECT" in the last 72 hours. Thyroid Function Tests: No results for input(s): "TSH", "T4TOTAL", "T3FREE", "THYROIDAB" in the last 72 hours.  Invalid input(s): "FREET3" Anemia Panel: No results for input(s): "VITAMINB12", "FOLATE", "FERRITIN", "TIBC", "IRON", "RETICCTPCT" in the last 72 hours.   Radiology: ECHOCARDIOGRAM COMPLETE  Result Date: 05/30/2022    ECHOCARDIOGRAM REPORT   Patient Name:   Randall HaysGEORGE Vincent Date of Exam: 05/30/2022 Medical Rec #:  [id number]     Height:       68.0 in Accession #:    [id number]    Weight:       231.1 lb Date of Birth:  08/26/1940      BSA:          2.173 m Patient Age:    81 years      BP:           114/55 mmHg Patient Gender: M             HR:           53 bpm. Exam Location:  [facility] Procedure: 2D  Echo, Cardiac Doppler and Color Doppler Indications:    CHF- Acute Systolic I50.21  History:        Patient has prior history of Echocardiogram examinations, most                 recent 09/18/2021. CHF, CAD and Previous Myocardial Infarction,                 Prior CABG, Arrythmias:Atrial Fibrillation, Signs/Symptoms:Chest                 Pain; Risk Factors:Dyslipidemia, Hypertension, Sleep Apnea and                 Diabetes. CKD, stage III.  Sonographer:     Lucendia Herrlich Referring Phys: [id number] IULIA BASARABA IMPRESSIONS  1. Left ventricular ejection fraction, by estimation, is 30 to 35%. The left ventricle has moderately decreased function. Left ventricular endocardial border not optimally defined to evaluate regional wall motion. The left ventricular internal cavity size was mildly dilated. There is moderate left ventricular hypertrophy. Left ventricular diastolic parameters are consistent with Grade III diastolic dysfunction (restrictive).  2. Right ventricular systolic function is severely reduced. The right ventricular size is mildly enlarged. There is moderately elevated pulmonary artery systolic pressure.  3. Left atrial size was moderately dilated.  4. Right atrial size was moderately dilated.  5. The mitral valve is abnormal. Moderate mitral valve regurgitation. No evidence of mitral stenosis. Moderate mitral annular calcification.  6. Tricuspid valve regurgitation is moderate to severe.  7. The aortic valve is normal in structure. Aortic valve regurgitation is not visualized. Aortic valve sclerosis is present, with no evidence of aortic valve stenosis.  8. Moderately dilated pulmonary artery.  9. The inferior vena cava is dilated in size with <50% respiratory variability, suggesting right atrial pressure of 15 mmHg. FINDINGS  Left Ventricle: Left ventricular ejection fraction, by estimation, is 30 to 35%. The left ventricle has moderately decreased function. Left ventricular endocardial border not optimally defined to evaluate regional wall motion. The left ventricular internal cavity size was mildly dilated. There is moderate left ventricular hypertrophy. Abnormal (paradoxical) septal motion consistent with post-operative status. Left ventricular diastolic parameters are consistent with Grade III diastolic dysfunction  (restrictive). Right Ventricle: The right ventricular size is mildly enlarged. No increase in right ventricular wall thickness. Right  ventricular systolic function is severely reduced. There is moderately elevated pulmonary artery systolic pressure. The tricuspid regurgitant velocity is 2.92 m/s, and with an assumed right atrial pressure of 15 mmHg, the estimated right ventricular systolic pressure is 49.1 mmHg. Left Atrium: Left atrial size was moderately dilated. Right Atrium: Right atrial size was moderately dilated. Pericardium: There is no evidence of pericardial effusion. Mitral Valve: The mitral valve is abnormal. There is mild thickening of the mitral valve leaflet(s). Moderate mitral annular calcification. Moderate mitral valve regurgitation. No evidence of mitral valve stenosis. Tricuspid Valve: The tricuspid valve is normal in structure. Tricuspid valve regurgitation is moderate to severe. No evidence of tricuspid stenosis. Aortic Valve: The aortic valve is normal in structure. Aortic valve regurgitation is not visualized. Aortic valve sclerosis is present, with no evidence of aortic valve stenosis. Aortic valve mean gradient measures 3.5 mmHg. Aortic valve peak gradient measures 6.5 mmHg. Aortic valve area, by VTI measures 3.06 cm. Pulmonic Valve: The pulmonic valve was normal in structure. Pulmonic valve regurgitation is mild to moderate. No evidence of pulmonic stenosis. Aorta: The aortic root is normal in size and structure. Pulmonary Artery: The pulmonary  artery is moderately dilated. Venous: The inferior vena cava is dilated in size with less than 50% respiratory variability, suggesting right atrial pressure of 15 mmHg. IAS/Shunts: No atrial level shunt detected by color flow Doppler.  LEFT VENTRICLE PLAX 2D LVIDd:         5.50 cm   Diastology LVIDs:         4.60 cm   LV e' medial:    5.43 cm/s LV PW:         1.40 cm   LV E/e' medial:  22.1 LV IVS:        1.40 cm   LV e' lateral:   6.97 cm/s LVOT diam:     2.20 cm   LV E/e' lateral: 17.2 LV SV:         86 LV SV Index:   40 LVOT Area:     3.80 cm  RIGHT VENTRICLE            IVC  RV S prime:     3.88 cm/s  IVC diam: 2.50 cm TAPSE (M-mode): 1.0 cm LEFT ATRIUM           Index        RIGHT ATRIUM           Index LA diam:      4.80 cm 2.21 cm/m   RA Area:     27.70 cm LA Vol (A2C): 55.9 ml 25.72 ml/m  RA Volume:   99.70 ml  45.88 ml/m LA Vol (A4C): 71.9 ml 33.08 ml/m  AORTIC VALVE                    PULMONIC VALVE AV Area (Vmax):    3.04 cm     PR End Diast Vel: 10.24 msec AV Area (Vmean):   3.04 cm AV Area (VTI):     3.06 cm AV Vmax:           127.50 cm/s AV Vmean:          84.500 cm/s AV VTI:            0.281 m AV Peak Grad:      6.5 mmHg AV Mean Grad:      3.5 mmHg LVOT Vmax:         102.00 cm/s LVOT Vmean:        67.600 cm/s LVOT VTI:          0.226 m LVOT/AV VTI ratio: 0.80  AORTA Ao Root diam: 3.30 cm Ao Asc diam:  3.50 cm MITRAL VALVE                TRICUSPID VALVE MV Area (PHT): 4.06 cm     TR Peak grad:   34.1 mmHg MV Decel Time: 187 msec     TR Vmax:        292.00 cm/s MR Peak grad: 66.3 mmHg MR Vmax:      407.00 cm/s   SHUNTS MV E velocity: 120.00 cm/s  Systemic VTI:  0.23 m MV A velocity: 47.40 cm/s   Systemic Diam: 2.20 cm MV E/A ratio:  2.53 Lorine BearsMuhammad Arida MD Electronically signed by Lorine BearsMuhammad Arida MD Signature Date/Time: 05/30/2022/4:25:38 PM    Final    US ARTERIAL ABI (SCREENING LOWER EXTREMITY)  Result Date: 05/30/2022 CLINICAL DATA:  82 year old male with a history of bilateral lower extremity edema EXAM: NONINVASIVE PHYSIOLOGIC VASCULAR STUDY OF BILATERAL LOWER EXTREMITIES TECHNIQUE: Evaluation of both lower extremities was performed at rest,  including calculation of ankle-brachial indices, multiple segmental pressure evaluation, segmental Doppler and segmental pulse volume recording. COMPARISON:  None Available. FINDINGS: Right ABI:  1.47 Left ABI:  1.55 Right Lower Extremity: Segmental Doppler at the right ankle demonstrates monophasic waveforms Left Lower Extremity: Segmental Doppler at the left ankle demonstrates triphasic posterior tibial and dorsalis pedis  waveform. IMPRESSION: Right: Resting ABI is likely falsely elevated secondary to noncompressible vessels, given the segmental Doppler. Segmental Doppler demonstrates monophasic waveforms at the right ankle, indicating more proximal occlusive disease. Left: Resting ABI likely falsely elevated secondary to noncompressible vessels. Segmental Doppler at the left ankle demonstrates waveforms relatively maintained. Signed, Yvone Neu. Miachel Roux, RPVI [facility] Electronically Signed   By: Gilmer Mor D.O.   On: 05/30/2022 10:56   DG Chest 2 View  Result Date: 05/29/2022 CLINICAL DATA:  Shortness of breath and ascites.  History of CHF. EXAM: CHEST - 2 VIEW COMPARISON:  09/16/2021 FINDINGS: Cardiac enlargement. Aortic atherosclerosis. Previous median sternotomy and CABG procedure. Bilateral pleural effusions, right greater than left. Mild interstitial edema. Atelectasis noted in both lung bases. No airspace opacities. IMPRESSION: 1. Mild congestive heart failure. 2. Bibasilar atelectasis. Electronically Signed   By: Signa Kell M.D.   On: 05/29/2022 10:27    ECHO 05/30/2022 1. Left ventricular ejection fraction, by estimation, is 30 to 35%. The  left ventricle has moderately decreased function. Left ventricular  endocardial border not optimally defined to evaluate regional wall motion.  The left ventricular internal cavity  size was mildly dilated. There is moderate left ventricular hypertrophy.  Left ventricular diastolic parameters are consistent with Grade III  diastolic dysfunction (restrictive).   2. Right ventricular systolic function is severely reduced. The right  ventricular size is mildly enlarged. There is moderately elevated  pulmonary artery systolic pressure.   3. Left atrial size was moderately dilated.   4. Right atrial size was moderately dilated.   5. The mitral valve is abnormal. Moderate mitral valve regurgitation. No   evidence of mitral stenosis. Moderate mitral annular calcification.   6. Tricuspid valve regurgitation is moderate to severe.   7. The aortic valve is normal in structure. Aortic valve regurgitation is  not visualized. Aortic valve sclerosis is present, with no evidence of  aortic valve stenosis.   8. Moderately dilated pulmonary artery.   9. The inferior vena cava is dilated in size with <50% respiratory  variability, suggesting right atrial pressure of 15 mmHg.   TELEMETRY reviewed by me (LT) 05/31/2022 : NSR rate 60-70  EKG reviewed by me: NSR bifacicular block   Data reviewed by me (LT) 05/31/2022: last outpatient cardiology note, ed note, admission H&P, hospitalist progress note, cbc, bmp, cxr, vitals, tele, trops    Principal Problem:   Acute on chronic heart failure (HCC) Active Problems:   Atrial fibrillation status post cardioversion 10/22/19 [facility])   Essential hypertension   DM (diabetes mellitus), type 2 (HCC)   Stage 3b chronic kidney disease (CKD) (HCC)   Venous stasis dermatitis of both lower extremities    ASSESSMENT AND PLAN:  Randall Vincent is a 54yoM with a PMH of CAD s/p CABG x2 1991 and BMS, paroxysmal AF s/p DCCV 2021 on Eliquis, HFpEF (LVEF 45-50%, mi-mod MR, mod TR 08/2021), HTN, DM2, CKD3, who presented to [facility] ED 05/29/22 with worsening shortness of breath and peripheral edema extending up to his thighs and abdomen. Cardiology is consulted on hospital day 2 for further assistance with his HF.  Echo this admission revealed a worsening EF of 30-35%, g3DD, and mod reduced LV function, severely reduced RV function  # Acute on chronic HFrEF (30-35%, G3 DD 05/2022) Presents with several days of worsening shortness of breath, abdominal distention, and peripheral edema, refractory to an extra dose of torsemide 20 mg the day prior to admission.  Admits to eating primarily frozen dinners and canned soup since his family who he usually eats with has been sick and they have not been  eating together for the past 2 to 3 weeks.  BNP is elevated to 1349 on admission and is clinically hypervolemic with pitting edema from his feet up to his lower abdomen/flanks.  Clinical improvement after IV Lasix, now on a Lasix infusion. -S/p IV Lasix 60 mg x 1, 40 mg x 2 -Agree with Lasix infusion 8 mg/h, consider up titration to 10 mg/h tomorrow (Home diuretic torsemide 20 mg twice daily) -Continue GDMT with Coreg 3.125 mg twice daily -Hold ramipril 10 mg with diuresis.  Consider change to ARNI, and addition of MRA, SGLT2i pending clinical course as BP and renal function allows -Strict I's/O, discussed salt and fluid restriction at length  # AKI on CKD 3 Cr trend with diuresis 2.18--2.37--2.26, current GFR 29, CR ranged from the past 6 months from 1.8-2.44 per review of his outpatient labs.  Monitor closely with diuresis  # Paroxysmal AF s/p DCCV 2021 Currently in sinus rhythm on telemetry, rate controlled on carvedilol 3.125 mg twice daily, rhythm controlled on amiodarone 200 mg once daily. -On dose reduced Eliquis 2.5 mg twice daily due to his age >61, and creatinine >1.5.  CHA2DS2-VASc 6 (age, CHF, HTN, CAD, DM2)   # CAD s/p CABG x 2 (1991) # Demand ischemia Currently denies chest pain, troponins on admission barely elevated at 22, 23, which is most consistent with demand/supply mismatch and not ACS.   This patient's plan of care was discussed and created with Dr. Clayborn Bigness and he is in agreement.  Signed: Tristan Schroeder , PA-C 05/31/2022, 9:02 AM [facility] Cardiology

## 2022-06-01 DIAGNOSIS — I5023 Acute on chronic systolic (congestive) heart failure: Secondary | ICD-10-CM | POA: Diagnosis not present

## 2022-06-01 LAB — CBC
HCT: 29.1 % — ABNORMAL LOW (ref 39.0–52.0)
Hemoglobin: 8.5 g/dL — ABNORMAL LOW (ref 13.0–17.0)
MCH: 26.8 pg (ref 26.0–34.0)
MCHC: 29.2 g/dL — ABNORMAL LOW (ref 30.0–36.0)
MCV: 91.8 fL (ref 80.0–100.0)
Platelets: 164 10*3/uL (ref 150–400)
RBC: 3.17 MIL/uL — ABNORMAL LOW (ref 4.22–5.81)
RDW: 18.5 % — ABNORMAL HIGH (ref 11.5–15.5)
WBC: 5.9 10*3/uL (ref 4.0–10.5)
nRBC: 0 % (ref 0.0–0.2)

## 2022-06-01 LAB — MAGNESIUM: Magnesium: 2.4 mg/dL (ref 1.7–2.4)

## 2022-06-01 LAB — GLUCOSE, CAPILLARY
Glucose-Capillary: 90 mg/dL (ref 70–99)
Glucose-Capillary: 92 mg/dL (ref 70–99)
Glucose-Capillary: 87 mg/dL (ref 70–99)
Glucose-Capillary: 94 mg/dL (ref 70–99)

## 2022-06-01 LAB — BASIC METABOLIC PANEL
Anion gap: 5 (ref 5–15)
BUN: 45 mg/dL — ABNORMAL HIGH (ref 8–23)
CO2: 32 mmol/L (ref 22–32)
Calcium: 8 mg/dL — ABNORMAL LOW (ref 8.9–10.3)
Chloride: 103 mmol/L (ref 98–111)
Creatinine, Ser: 2.15 mg/dL — ABNORMAL HIGH (ref 0.61–1.24)
GFR, Estimated: 30 mL/min — ABNORMAL LOW (ref >60)
Glucose, Bld: 103 mg/dL — ABNORMAL HIGH (ref 70–99)
Potassium: 3.9 mmol/L (ref 3.5–5.1)
Sodium: 140 mmol/L (ref 135–145)

## 2022-06-01 LAB — PHOSPHORUS: Phosphorus: 4.1 mg/dL (ref 2.5–4.6)

## 2022-06-01 MED ORDER — POTASSIUM CHLORIDE CRYS ER 20 MEQ PO TBCR
40.0000 meq | EXTENDED_RELEASE_TABLET | Freq: Once | ORAL | Status: AC
  Administered 2022-06-01: 40 meq via ORAL
  Filled 2022-06-01: qty 2

## 2022-06-01 MED ORDER — VITAMIN D (ERGOCALCIFEROL) 1.25 MG (50000 UNIT) PO CAPS
50000.0000 [IU] | ORAL_CAPSULE | ORAL | Status: DC
  Administered 2022-06-01 – 2022-06-08 (×2): 50000 [IU] via ORAL
  Filled 2022-06-01 (×2): qty 1

## 2022-06-01 MED ORDER — METOLAZONE 2.5 MG PO TABS
2.5000 mg | ORAL_TABLET | Freq: Once | ORAL | Status: AC
  Administered 2022-06-01: 2.5 mg via ORAL
  Filled 2022-06-01: qty 1

## 2022-06-01 NOTE — Progress Notes (Signed)
Occupational Therapy Treatment Patient Details Name: Randall Vincent MRN: [id number] DOB: 1940/12/19 Today's Date: 06/01/2022   History of present illness Patient is an 82 year old male who presents to ED for SOB. Patient has had gradually worsening LE swelling that reached to his abdomen. 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   OT comments  Upon entering session, pt sitting on toilet with RN present. Pt with continent void. He completed peri care in sitting with supervision and required Mod A to stand from toilet 2/2 lower transfer surface. Upon standing, pt's bottom starting to bleed. RN called back into room to address and placing mepilex patch on bottom/sacral area. Pt then completing functional mobility back to EOB with supervision using RW. He required Max A to doff socks and Min A to return to supine. Pt left semi-reclined in bed with all needs in reach. Pt is making progress toward goal completion. OT will continue to follow acutely.    Recommendations for follow up therapy are one component of a multi-disciplinary discharge planning process, led by the attending physician.  Recommendations may be updated based on patient status, additional functional criteria and insurance authorization.    Follow Up Recommendations  No OT follow up     Assistance Recommended at Discharge Intermittent Supervision/Assistance  Patient can return home with the following  Assistance with cooking/housework;Assist for transportation;Help with stairs or ramp for entrance;A little help with walking and/or transfers;A little help with bathing/dressing/bathroom   Equipment Recommendations  Other (comment) (sock aid)    Recommendations for Other Services      Precautions / Restrictions Precautions Precautions: Fall Restrictions Weight Bearing Restrictions: No       Mobility Bed Mobility Overal bed mobility: Needs Assistance Bed  Mobility: Sit to Supine       Sit to supine: Min assist   General bed mobility comments: to manage BLEs    Transfers Overall transfer level: Needs assistance Equipment used: Rolling walker (2 wheels) Transfers: Sit to/from Stand Sit to Stand: Mod assist           General transfer comment: to stand from toilet 2/2 lower transfer surface     Balance Overall balance assessment: Needs assistance Sitting-balance support: No upper extremity supported, Feet supported Sitting balance-Leahy Scale: Good     Standing balance support: Single extremity supported, During functional activity, Bilateral upper extremity supported Standing balance-Leahy Scale: Fair                             ADL either performed or assessed with clinical judgement   ADL Overall ADL's : Needs assistance/impaired                     Lower Body Dressing: Maximal assistance;Sitting/lateral leans Lower Body Dressing Details (indicate cue type and reason): to doff socks while sitting EOB Toilet Transfer: Regular Toilet;Grab bars;Moderate assistance Toilet Transfer Details (indicate cue type and reason): to stand from toilet 2/2 lower transfer surface Toileting- Clothing Manipulation and Hygiene: Supervision/safety;Sitting/lateral lean Toileting - Clothing Manipulation Details (indicate cue type and reason): peri care in sitting with supervision     Functional mobility during ADLs: Supervision/safety;Rolling walker (2 wheels) (~10 ft from toilet back to EOB)      Extremity/Trunk Assessment Upper Extremity Assessment Upper Extremity Assessment: Generalized weakness   Lower Extremity Assessment Lower Extremity Assessment: Generalized weakness  Cervical / Trunk Assessment Cervical / Trunk Assessment: Normal    Vision Baseline Vision/History: 1 Wears glasses Patient Visual Report: No change from baseline     Perception     Praxis      Cognition Arousal/Alertness:  Awake/alert Behavior During Therapy: WFL for tasks assessed/performed Overall Cognitive Status: Within Functional Limits for tasks assessed                                          Exercises      Shoulder Instructions       General Comments      Pertinent Vitals/ Pain       Pain Assessment Pain Assessment: Faces Faces Pain Scale: Hurts a little bit Pain Location: BLEs Pain Descriptors / Indicators: Sore Pain Intervention(s): Monitored during session  Home Living                                          Prior Functioning/Environment              Frequency  Min 2X/week        Progress Toward Goals  OT Goals(current goals can now be found in the care plan section)  Progress towards OT goals: Progressing toward goals  Acute Rehab OT Goals Patient Stated Goal: return home OT Goal Formulation: With patient Time For Goal Achievement: 06/13/22 Potential to Achieve Goals: Good  Plan Frequency remains appropriate    Co-evaluation                 AM-PAC OT "6 Clicks" Daily Activity     Outcome Measure   Help from another person eating meals?: None Help from another person taking care of personal grooming?: None Help from another person toileting, which includes using toliet, bedpan, or urinal?: A Lot Help from another person bathing (including washing, rinsing, drying)?: A Lot Help from another person to put on and taking off regular upper body clothing?: None Help from another person to put on and taking off regular lower body clothing?: A Lot 6 Click Score: 18    End of Session Equipment Utilized During Treatment: Gait belt;Rolling walker (2 wheels)  OT Visit Diagnosis: Unsteadiness on feet (R26.81);Muscle weakness (generalized) (M62.81)   Activity Tolerance Patient tolerated treatment well   Patient Left in bed;with call bell/phone within reach;with bed alarm set   Nurse Communication Mobility status;Other  (comment) (pt's bottom bleeding)        Time: 1550-1605 OT Time Calculation (min): 15 min  Charges: OT General Charges $OT Visit: 1 Visit OT Treatments $Self Care/Home Management : 8-22 mins  [facility] MS, OTR/L ascom [phone]  06/01/22, 5:30 PM

## 2022-06-01 NOTE — Progress Notes (Signed)
[facility] Progress Note  Patient: Randall Vincent    WFU:[id number]  DOA: 05/29/2022     Date of Service: the patient was seen and examined on 06/01/2022  Chief Complaint  Patient presents with   Shortness of Breath   Brief hospital course: Randall Vincent is a 82 y.o. 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 ED w/up: Chest x-ray was obtained that demonstrated interstitial edema, mild, with bilateral pleural effusions. Due to failure of outpatient treatment, [facility] contacted for admission for acute on chronic heart failure.    Assessment and Plan:  # Acute on chronic systolic heart failure  Patient has a history of moderately reduced EF with last EF of 45-50%, follows with Dr. Clayborn Vincent.  Despite increasing doses of home torsemide, patient presents with marked pitting edema extending up to the mid abdomen. - S/p Lasix 60 mg IV once and  Lasix 40 mg IV BID 1/8 started Lasix IV infusion anasarca - Strict in and out - Daily weights - Continue home GDMT TTE shows LVEF 30 to 20%, grade 3 diastolic dysfunction, right ventricular failure, moderate pulm hypertension, moderate MR and moderate to severe TR Cardiology consulted for further recommendation. 1/10 metolazone 2.5 mg p.o. x 1 dose ordered by cardiology    Stage 3b chronic kidney disease (CKD) Patient's renal function frequently fluctuates between 1.6 and 2.2.   Currently 2.18, previously 1.64.  I wonder if there is a component of cardiorenal syndrome.  Will monitor closely while diuresing. - Daily BMP - Monitor urine output   Atrial fibrillation status post cardioversion 10/22/19  Currently rate controlled. Continue home amiodarone, Eliquis, and Coreg   Venous stasis dermatitis of both  lower extremities Per Dr. Etta Vincent most recent note, plans were to obtain ABIs.  Will do so at this time and if no evidence of PAD, plan to order Unna boots ABI:  Right ABI:  1.47 and Left ABI:  1.55 Right Lower Extremity: Segmental Doppler at the right ankle demonstrates monophasic waveforms Left Lower Extremity: Segmental Doppler at the left ankle demonstrates triphasic posterior tibial and dorsalis pedis waveform. - pt may need Unna boots, will reassess after diuresis   Essential hypertension - Continue home antihypertensives   DM (diabetes mellitus), type 2 (Randall Vincent) Well-managed with semaglutide with last A1c of 5.9% 4 weeks ago. - Hold home semaglutide while admitted - SSI, moderate    Body mass index is 35.14 kg/m.  Interventions:     Diet: Heart healthy diet, fluid striction 1.5 L/day DVT Prophylaxis: Therapeutic Anticoagulation with Eliquis    Advance goals of care discussion: DNR  Family Communication: family was not present at bedside, at the time of interview.  The pt provided permission to discuss medical plan with the family. Opportunity was given to ask question and all questions were answered satisfactorily.   Disposition:  Pt is from Home, admitted with volume overload on IV Lasix infusion, still has anasarca, which precludes a safe discharge. Discharge to home, when clinically stable, may need 3 to 5 days to stay.  Subjective: No significant overnight events, patient feels improvement in the bilateral lower extremity edema, still has significant abdominal wall edema.  Breathing is getting better, denies any active issues.   Physical Exam: General: NAD, lying comfortably, mild SOB Appear in  no distress, affect appropriate Eyes: Right eye PERRLA, (left eye blind since childhood) ENT: Oral Mucosa Clear, moist  Neck: no JVD,  Cardiovascular: S1 and S2 Present, no Murmur,  Respiratory: good respiratory effort, Bilateral Air entry equal and Decreased, mild  bilateral crackles, no wheezes Abdomen: Bowel Sound present, Soft and no tenderness, edematous abdominal wall Skin: no rashes Extremities: 3 + Pedal edema, no calf tenderness, chronic venous stasis pigmentation Neurologic: without any new focal findings Gait not checked due to patient safety concerns  Vitals:   05/31/22 2339 06/01/22 0503 06/01/22 0751 06/01/22 1137  BP: (!) 112/54 137/62 (!) 119/55 (!) 122/57  Pulse: 71 71 76 75  Resp: 18 18 18 18   Temp: 98.2 F (36.8 C) 98.7 F (37.1 C) 97.8 F (36.6 C) 97.7 F (36.5 C)  TempSrc: Oral     SpO2: 96% 98% 99% 93%  Weight:  102.4 kg    Height:        Intake/Output Summary (Last 24 hours) at 06/01/2022 1521 Last data filed at 06/01/2022 1323 Gross per 24 hour  Intake 391.48 ml  Output 2280 ml  Net -1888.52 ml   Filed Weights   05/29/22 1244 05/31/22 2005 06/01/22 0503  Weight: 104.8 kg 103.1 kg 102.4 kg    Data Reviewed: I have personally reviewed and interpreted daily labs, tele strips, imagings as discussed above. I reviewed all nursing notes, pharmacy notes, vitals, pertinent old records I have discussed plan of care as described above with RN and patient/family.  CBC: Recent Labs  Lab 05/29/22 0949 05/30/22 0225 05/31/22 0500 06/01/22 0451  WBC 4.8 4.8 6.0 5.9  NEUTROABS  --  3.6  --   --   HGB 9.3* 7.9* 8.8* 8.5*  HCT 31.8* 26.8* 30.4* 29.1*  MCV 90.9 91.2 92.7 91.8  PLT 170 150 164 976   Basic Metabolic Panel: Recent Labs  Lab 05/29/22 0949 05/30/22 0225 05/31/22 0500 06/01/22 0451  NA 140 140 138 140  K 4.3 4.0 4.5 3.9  CL 104 105 103 103  CO2 28 29 28  32  GLUCOSE 109* 101* 97 103*  BUN 48* 48* 46* 45*  CREATININE 2.18* 2.37* 2.26* 2.15*  CALCIUM 8.2* 7.7* 7.8* 8.0*  MG 2.4 2.2 2.4 2.4  PHOS  --   --  4.6 4.1    Studies: No results found.  Scheduled Meds:  amiodarone  200 mg Oral Daily   apixaban  2.5 mg Oral BID   atorvastatin  40 mg Oral QPM   carvedilol  3.125 mg Oral BID   insulin  aspart  0-15 Units Subcutaneous TID WC   Vitamin D (Ergocalciferol)  50,000 Units Oral Q7 days   Continuous Infusions:  furosemide (LASIX) 200 mg in dextrose 5 % 100 mL (2 mg/mL) infusion 8 mg/hr (06/01/22 1200)   PRN Meds: acetaminophen, benzonatate, ondansetron (ZOFRAN) IV  Time spent: 50 minutes  Author: Val Riles. MD [facility] 06/01/2022 3:21 PM  To reach On-call, see care teams to locate the attending and reach out to them via [email or web address]. If 7PM-7AM, please contact night-coverage If you still have difficulty reaching the attending provider, please page the [facility] (Director on Call) for [facility] on amion for assistance.

## 2022-06-01 NOTE — Plan of Care (Signed)

## 2022-06-01 NOTE — Progress Notes (Signed)
PT Cancellation Note  Patient Details Name: Randall Vincent MRN: [id number] DOB: 27-Dec-1940   Cancelled Treatment:     PT treatment not completed 2/2 to pt refused PT interventions today due to feeling of too much fluid in the body and feeling fatigued. Pt deferred and PT will attempt again tomorrow.   Joaquin Music PT DPT 1:43 PM,06/01/22

## 2022-06-01 NOTE — Progress Notes (Signed)
[place] NOTE       Patient ID: Randall Vincent MRN: [id number] DOB/AGE: 81/11/1940 81 y.o.  Admit date: 05/29/2022 Referring Physician Dr. Val Riles Primary Physician Grier Rocher, PA-C ([place])  Primary Cardiologist Dr. Clayborn Bigness Reason for Consultation AoCHF  HPI: Randall Vincent is a 82yoM with a PMH of CAD s/p CABG x2 1991 and BMS, paroxysmal AF s/p DCCV 2021 on Eliquis, HFpEF (LVEF 45-50%, mi-mod MR, mod TR 08/2021), HTN, DM2, CKD3, who presented to [facility] ED 05/29/22 with worsening shortness of breath and peripheral edema extending up to his thighs and abdomen. Cardiology is consulted on hospital day 2 for further assistance with his HF. Echo this admission revealed a worsening EF of 30-35%, g3DD, and mod reduced LV function, severely reduced RV function  Interval History:  - feels good today, still feels somewhat weak when ambulating to the restroom and short of breath. No chest pain, palpitations.  - had 2 Bms, feels like his abdominal swelling improving although still present  - diuresing well, net neg 2.1L, remains on lasix gtt at 8mg /hr. Renal function stable  Review of systems complete and found to be negative unless listed above     Past Medical History:  Diagnosis Date   A-fib ([place])    a.) CHA2DS2-VASc = 6 (age x 2, CHF, HTN, previous MI, T2DM). b.) Rate/rhythm maintained on amiodarone + diltiazem + carvedilol; chronically anticoagulated using dabigatran. c.) DCCV 07/10/2015, 08/08/2019, 10/22/2019.   Arthritis    Chest pain 06/26/2019   CHF (congestive heart failure) (HCC)    CKD (chronic kidney disease), stage III ([place])    Coronary artery disease 1991   a.) 2v CABG in 1991. b.) PCI 06/07/2006 --> Vision stents to native LCx and RCA   Hyperlipidemia    Hypertension    Long term current use of anticoagulant    a.) dabigatran   Myocardial infarction ([place]) 1991   a.) LHC --> 50% and 90% LAD lesions; referred to CVTS. b.) ultimately  underwent 2v CABG (LIMA-LAD, SVG-RCA)   OSA on CPAP    S/P CABG x 2 01/02/1990   a.) LIMA-LAD, SVG-RCA   T2DM (type 2 diabetes mellitus) ([place])     Past Surgical History:  Procedure Laterality Date   BACK SURGERY  1970   L5-L6 ruptured disk   BLADDER SURGERY  2000   CARDIOVERSION N/A 07/10/2015   CARDIOVERSION N/A 08/08/2019   CARDIOVERSION N/A 10/22/2019   COLON SURGERY     COLONOSCOPY N/A 01/15/2020   Procedure: COLONOSCOPY;  Surgeon: Virgel Manifold, MD;  Location: [facility] ENDOSCOPY;  Service: Endoscopy;  Laterality: N/A;   COLONOSCOPY N/A 01/16/2020   Procedure: COLONOSCOPY;  Surgeon: Virgel Manifold, MD;  Location: [facility] ENDOSCOPY;  Service: Endoscopy;  Laterality: N/A;   CORONARY ARTERY BYPASS GRAFT N/A 01/02/1990   Procedure: 2v CORONARY ARTERY BYPASS GRAFT (LIMA-LAD, SVG-RCA)   ESOPHAGOGASTRODUODENOSCOPY N/A 01/13/2020   Procedure: ESOPHAGOGASTRODUODENOSCOPY (EGD);  Surgeon: Virgel Manifold, MD;  Location: [facility] ENDOSCOPY;  Service: Endoscopy;  Laterality: N/A;   REPLACEMENT TOTAL KNEE BILATERAL Bilateral 2008   STENT PLACEMENT VASCULAR ([place] HX)  2008   TOTAL KNEE REVISION Left 01/16/2020   Procedure: TOTAL KNEE REVISION;  Surgeon: Lovell Sheehan, MD;  Location: [facility] ORS;  Service: Orthopedics;  Laterality: Left;   TOTAL KNEE REVISION Left 05/31/2021   Procedure: TOTAL KNEE REVISION;  Surgeon: Lovell Sheehan, MD;  Location: [facility] ORS;  Service: Orthopedics;  Laterality: Left;    Medications Prior to  Admission  Medication Sig Dispense Refill Last Dose   amiodarone (PACERONE) 200 MG tablet Take 200 mg by mouth daily.   05/29/2022   apixaban (ELIQUIS) 2.5 MG TABS tablet Take 2.5 mg by mouth 2 (two) times daily.   05/29/2022   atorvastatin (LIPITOR) 40 MG tablet Take 40 mg by mouth every evening.    05/28/2022   carvedilol (COREG) 12.5 MG tablet Take 12.5 mg by mouth 2 (two) times daily.   [id number]   PROAIR RESPICLICK 123XX123 (90 Base) MCG/ACT AEPB Inhale into the lungs.   prn    ramipril (ALTACE) 10 MG capsule Take 1 capsule (10 mg total) by mouth daily. 30 capsule 1 05/29/2022   Semaglutide, 1 MG/DOSE, 2 MG/1.5ML SOPN Inject 1 mg into the skin every Thursday.   Past Month   torsemide (DEMADEX) 20 MG tablet Take 1 tablet (20 mg total) by mouth 2 (two) times daily. (Patient taking differently: Take 10-20 mg by mouth See admin instructions. Take 20 mg by mouth in the morning and 10 mg in the evening) 60 tablet 1 05/29/2022   vitamin B-12 (CYANOCOBALAMIN) 1000 MCG tablet Take 1,000 mcg by mouth daily.   05/29/2022   apixaban (ELIQUIS) 5 MG TABS tablet Take 1 tablet (5 mg total) by mouth 2 (two) times daily. (Patient not taking: Reported on 05/29/2022) 60 tablet 3 Not Taking   carvedilol (COREG) 3.125 MG tablet Take 1 tablet (3.125 mg total) by mouth 2 (two) times daily with a meal. (Patient not taking: Reported on 05/29/2022) 60 tablet 1 Not Taking   colchicine 0.6 MG tablet Take 0.6 mg by mouth 2 (two) times daily. (Patient not taking: Reported on 05/05/2022)      diltiazem (CARDIZEM CD) 240 MG 24 hr capsule Take 240 mg by mouth daily. (Patient not taking: Reported on 05/29/2022)   Not Taking   metFORMIN (GLUCOPHAGE-XR) 500 MG 24 hr tablet Take 500 mg by mouth 2 (two) times daily. (Patient not taking: Reported on 05/05/2022)       Social History   Socioeconomic History   Marital status: Widowed    Spouse name: Not on file   Number of children: Not on file   Years of education: Not on file   Highest education level: Not on file  Occupational History   Not on file  Tobacco Use   Smoking status: Former    Types: Cigarettes    Quit date: 53    Years since quitting: 33.0   Smokeless tobacco: Never  Vaping Use   Vaping Use: Never used  Substance and Sexual Activity   Alcohol use: Not Currently    Alcohol/week: 1.0 standard drink of alcohol    Types: 1 Cans of beer per week    Comment: very rarely   Drug use: Never   Sexual activity: Not on file  Other Topics Concern    Not on file  Social History Narrative   Not on file   Social Determinants of Health   Financial Resource Strain: Not on file  Food Insecurity: No Food Insecurity (05/30/2022)   Hunger Vital Sign    Worried About Running Out of Food in the Last Year: Never true    [place] in the Last Year: Never true  Transportation Needs: No Transportation Needs (05/30/2022)   PRAPARE - Hydrologist (Medical): No    Lack of Transportation (Non-Medical): No  Physical Activity: Not on file  Stress: Not on file  Social  Connections: Not on file  Intimate Partner Violence: Not At Risk (05/30/2022)   Humiliation, Afraid, Rape, and Kick questionnaire    Fear of Current or Ex-Partner: No    Emotionally Abused: No    Physically Abused: No    Sexually Abused: No    History reviewed. No pertinent family history.    Intake/Output Summary (Last 24 hours) at 06/01/2022 0826 Last data filed at 06/01/2022 0751 Gross per 24 hour  Intake 131.48 ml  Output 1480 ml  Net -1348.52 ml     Vitals:   05/31/22 2005 05/31/22 2339 06/01/22 0503 06/01/22 0751  BP:  (!) 112/54 137/62 (!) 119/55  Pulse:  71 71 76  Resp:  18 18 18   Temp:  98.2 F (36.8 C) 98.7 F (37.1 C) 97.8 F (36.6 C)  TempSrc:  Oral    SpO2:  96% 98% 99%  Weight: 103.1 kg  102.4 kg   Height: 5\' 8"  (1.727 m)       PHYSICAL EXAM General: elderly and ill appearing caucasian male , well nourished, in no acute distress. Sitting upright in hospital bed in PCU, sleeping upon my entry to the room but awakens easily to voice HEENT:  Normocephalic and atraumatic. Neck:  No JVD.  Lungs: slight conversational dyspnea on O2 by [place]. Decreased breath sounds with right basilar crackles Heart: HRRR . Normal S1 and S2 without gallops or murmurs.  Abdomen: mildly distended appearing with pitting edema on both lower flanks and along pannus with mild erythema.  Msk: Normal strength and tone for age. Extremities: chronic venous  stasis hyperpigmentation with RLE crusted nickle sized area of crusting.trace edema bilaterally  Neuro: Alert and oriented X 3. Psych:  Answers questions appropriately.   Labs: Basic Metabolic Panel: Recent Labs    05/31/22 0500 06/01/22 0451  NA 138 140  K 4.5 3.9  CL 103 103  CO2 28 32  GLUCOSE 97 103*  BUN 46* 45*  CREATININE 2.26* 2.15*  CALCIUM 7.8* 8.0*  MG 2.4 2.4  PHOS 4.6 4.1    Liver Function Tests: Recent Labs    05/29/22 0949  AST 25  ALT 17  ALKPHOS 102  BILITOT 1.2  PROT 6.3*  ALBUMIN 2.8*    No results for input(s): "LIPASE", "AMYLASE" in the last 72 hours. CBC: Recent Labs    05/30/22 0225 05/31/22 0500 06/01/22 0451  WBC 4.8 6.0 5.9  NEUTROABS 3.6  --   --   HGB 7.9* 8.8* 8.5*  HCT 26.8* 30.4* 29.1*  MCV 91.2 92.7 91.8  PLT 150 164 164    Cardiac Enzymes: Recent Labs    05/29/22 0949 05/30/22 0225  TROPONINIHS 22* 23*    BNP: Recent Labs    05/29/22 0949  BNP 1,349.1*    D-Dimer: No results for input(s): "DDIMER" in the last 72 hours. Hemoglobin A1C: No results for input(s): "HGBA1C" in the last 72 hours. Fasting Lipid Panel: No results for input(s): "CHOL", "HDL", "LDLCALC", "TRIG", "CHOLHDL", "LDLDIRECT" in the last 72 hours. Thyroid Function Tests: No results for input(s): "TSH", "T4TOTAL", "T3FREE", "THYROIDAB" in the last 72 hours.  Invalid input(s): "FREET3" Anemia Panel: No results for input(s): "VITAMINB12", "FOLATE", "FERRITIN", "TIBC", "IRON", "RETICCTPCT" in the last 72 hours.   Radiology: ECHOCARDIOGRAM COMPLETE  Result Date: 05/30/2022    ECHOCARDIOGRAM REPORT   Patient Name:   YEIDEN FRENKEL Date of Exam: 05/30/2022 Medical Rec #:  [name]     Height:       68.0 in Accession #:  [id number]    Weight:       231.1 lb Date of Birth:  Dec 29, 1940      BSA:          2.173 m Patient Age:    21 years      BP:           114/55 mmHg Patient Gender: M             HR:           53 bpm. Exam Location:  [facility] Procedure: 2D  Echo, Cardiac Doppler and Color Doppler Indications:    CHF- Acute Systolic N47.09  History:        Patient has prior history of Echocardiogram examinations, most                 recent 09/18/2021. CHF, CAD and Previous Myocardial Infarction,                 Prior CABG, Arrythmias:Atrial Fibrillation, Signs/Symptoms:Chest                 Pain; Risk Factors:Dyslipidemia, Hypertension, Sleep Apnea and                 Diabetes. CKD, stage III.  Sonographer:    Ronny Flurry Referring Phys: [id number] IULIA BASARABA IMPRESSIONS  1. Left ventricular ejection fraction, by estimation, is 30 to 35%. The left ventricle has moderately decreased function. Left ventricular endocardial border not optimally defined to evaluate regional wall motion. The left ventricular internal cavity size was mildly dilated. There is moderate left ventricular hypertrophy. Left ventricular diastolic parameters are consistent with Grade III diastolic dysfunction (restrictive).  2. Right ventricular systolic function is severely reduced. The right ventricular size is mildly enlarged. There is moderately elevated pulmonary artery systolic pressure.  3. Left atrial size was moderately dilated.  4. Right atrial size was moderately dilated.  5. The mitral valve is abnormal. Moderate mitral valve regurgitation. No evidence of mitral stenosis. Moderate mitral annular calcification.  6. Tricuspid valve regurgitation is moderate to severe.  7. The aortic valve is normal in structure. Aortic valve regurgitation is not visualized. Aortic valve sclerosis is present, with no evidence of aortic valve stenosis.  8. Moderately dilated pulmonary artery.  9. The inferior vena cava is dilated in size with <50% respiratory variability, suggesting right atrial pressure of 15 mmHg. FINDINGS  Left Ventricle: Left ventricular ejection fraction, by estimation, is 30 to 35%. The left ventricle has moderately decreased function. Left ventricular endocardial border not  optimally defined to evaluate regional wall motion. The left ventricular internal cavity size was mildly dilated. There is moderate left ventricular hypertrophy. Abnormal (paradoxical) septal motion consistent with post-operative status. Left ventricular diastolic parameters are consistent with Grade III diastolic dysfunction  (restrictive). Right Ventricle: The right ventricular size is mildly enlarged. No increase in right ventricular wall thickness. Right ventricular systolic function is severely reduced. There is moderately elevated pulmonary artery systolic pressure. The tricuspid regurgitant velocity is 2.92 m/s, and with an assumed right atrial pressure of 15 mmHg, the estimated right ventricular systolic pressure is 94.7 mmHg. Left Atrium: Left atrial size was moderately dilated. Right Atrium: Right atrial size was moderately dilated. Pericardium: There is no evidence of pericardial effusion. Mitral Valve: The mitral valve is abnormal. There is mild thickening of the mitral valve leaflet(s). Moderate mitral annular calcification. Moderate mitral valve regurgitation. No evidence of mitral valve stenosis. Tricuspid Valve: The tricuspid valve is normal in structure. Tricuspid valve  regurgitation is moderate to severe. No evidence of tricuspid stenosis. Aortic Valve: The aortic valve is normal in structure. Aortic valve regurgitation is not visualized. Aortic valve sclerosis is present, with no evidence of aortic valve stenosis. Aortic valve mean gradient measures 3.5 mmHg. Aortic valve peak gradient measures 6.5 mmHg. Aortic valve area, by VTI measures 3.06 cm. Pulmonic Valve: The pulmonic valve was normal in structure. Pulmonic valve regurgitation is mild to moderate. No evidence of pulmonic stenosis. Aorta: The aortic root is normal in size and structure. Pulmonary Artery: The pulmonary artery is moderately dilated. Venous: The inferior vena cava is dilated in size with less than 50% respiratory variability,  suggesting right atrial pressure of 15 mmHg. IAS/Shunts: No atrial level shunt detected by color flow Doppler.  LEFT VENTRICLE PLAX 2D LVIDd:         5.50 cm   Diastology LVIDs:         4.60 cm   LV e' medial:    5.43 cm/s LV PW:         1.40 cm   LV E/e' medial:  22.1 LV IVS:        1.40 cm   LV e' lateral:   6.97 cm/s LVOT diam:     2.20 cm   LV E/e' lateral: 17.2 LV SV:         86 LV SV Index:   40 LVOT Area:     3.80 cm  RIGHT VENTRICLE            IVC RV S prime:     3.88 cm/s  IVC diam: 2.50 cm TAPSE (M-mode): 1.0 cm LEFT ATRIUM           Index        RIGHT ATRIUM           Index LA diam:      4.80 cm 2.21 cm/m   RA Area:     27.70 cm LA Vol (A2C): 55.9 ml 25.72 ml/m  RA Volume:   99.70 ml  45.88 ml/m LA Vol (A4C): 71.9 ml 33.08 ml/m  AORTIC VALVE                    PULMONIC VALVE AV Area (Vmax):    3.04 cm     PR End Diast Vel: 10.24 msec AV Area (Vmean):   3.04 cm AV Area (VTI):     3.06 cm AV Vmax:           127.50 cm/s AV Vmean:          84.500 cm/s AV VTI:            0.281 m AV Peak Grad:      6.5 mmHg AV Mean Grad:      3.5 mmHg LVOT Vmax:         102.00 cm/s LVOT Vmean:        67.600 cm/s LVOT VTI:          0.226 m LVOT/AV VTI ratio: 0.80  AORTA Ao Root diam: 3.30 cm Ao Asc diam:  3.50 cm MITRAL VALVE                TRICUSPID VALVE MV Area (PHT): 4.06 cm     TR Peak grad:   34.1 mmHg MV Decel Time: 187 msec     TR Vmax:        292.00 cm/s MR Peak grad: 66.3 mmHg MR Vmax:  407.00 cm/s   SHUNTS MV E velocity: 120.00 cm/s  Systemic VTI:  0.23 m MV A velocity: 47.40 cm/s   Systemic Diam: 2.20 cm MV E/A ratio:  2.53 Kathlyn Sacramento MD Electronically signed by Kathlyn Sacramento MD Signature Date/Time: 05/30/2022/4:25:38 PM    Final    US ARTERIAL ABI (SCREENING LOWER EXTREMITY)  Result Date: 05/30/2022 CLINICAL DATA:  82 year old male with a history of bilateral lower extremity edema EXAM: NONINVASIVE PHYSIOLOGIC VASCULAR STUDY OF BILATERAL LOWER EXTREMITIES TECHNIQUE: Evaluation of both lower  extremities was performed at rest, including calculation of ankle-brachial indices, multiple segmental pressure evaluation, segmental Doppler and segmental pulse volume recording. COMPARISON:  None Available. FINDINGS: Right ABI:  1.47 Left ABI:  1.55 Right Lower Extremity: Segmental Doppler at the right ankle demonstrates monophasic waveforms Left Lower Extremity: Segmental Doppler at the left ankle demonstrates triphasic posterior tibial and dorsalis pedis waveform. IMPRESSION: Right: Resting ABI is likely falsely elevated secondary to noncompressible vessels, given the segmental Doppler. Segmental Doppler demonstrates monophasic waveforms at the right ankle, indicating more proximal occlusive disease. Left: Resting ABI likely falsely elevated secondary to noncompressible vessels. Segmental Doppler at the left ankle demonstrates waveforms relatively maintained. Signed, Dulcy Fanny. Nadene Rubins, RPVI [facility] Electronically Signed   By: Corrie Mckusick D.O.   On: 05/30/2022 10:56   DG Chest 2 View  Result Date: 05/29/2022 CLINICAL DATA:  Shortness of breath and ascites.  History of CHF. EXAM: CHEST - 2 VIEW COMPARISON:  09/16/2021 FINDINGS: Cardiac enlargement. Aortic atherosclerosis. Previous median sternotomy and CABG procedure. Bilateral pleural effusions, right greater than left. Mild interstitial edema. Atelectasis noted in both lung bases. No airspace opacities. IMPRESSION: 1. Mild congestive heart failure. 2. Bibasilar atelectasis. Electronically Signed   By: Kerby Moors M.D.   On: 05/29/2022 10:27    ECHO 05/30/2022 1. Left ventricular ejection fraction, by estimation, is 30 to 35%. The  left ventricle has moderately decreased function. Left ventricular  endocardial border not optimally defined to evaluate regional wall motion.  The left ventricular internal cavity  size was mildly dilated. There is moderate left ventricular  hypertrophy.  Left ventricular diastolic parameters are consistent with Grade III  diastolic dysfunction (restrictive).   2. Right ventricular systolic function is severely reduced. The right  ventricular size is mildly enlarged. There is moderately elevated  pulmonary artery systolic pressure.   3. Left atrial size was moderately dilated.   4. Right atrial size was moderately dilated.   5. The mitral valve is abnormal. Moderate mitral valve regurgitation. No  evidence of mitral stenosis. Moderate mitral annular calcification.   6. Tricuspid valve regurgitation is moderate to severe.   7. The aortic valve is normal in structure. Aortic valve regurgitation is  not visualized. Aortic valve sclerosis is present, with no evidence of  aortic valve stenosis.   8. Moderately dilated pulmonary artery.   9. The inferior vena cava is dilated in size with <50% respiratory  variability, suggesting right atrial pressure of 15 mmHg.   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 Cardiology is consulted on hospital day 2 for further assistance with his HF. Echo this admission revealed a worsening EF of 30-35%, g3DD, and mod  reduced LV function, severely reduced RV function  # Acute on chronic HFrEF (30-35%, G3 DD 05/2022) Presents with several days of worsening shortness of breath, abdominal distention, and peripheral edema, refractory to an extra dose of torsemide 20 mg the day prior to admission.  Admits to eating primarily frozen dinners and canned soup since his family who he usually eats with has been sick and they have not been eating together for the past 2 to 3 weeks.  BNP is elevated to 1349 on admission and is clinically hypervolemic with pitting edema from his feet up to his lower abdomen/flanks.  Clinical improvement after IV Lasix, now on a Lasix infusion. -S/p IV Lasix 60 mg x 1, 40 mg x 2 -continue Lasix infusion 8 mg/h (Home diuretic torsemide 20 mg twice daily) -add metolazone 2.5mg  PO x1 today, monitor response & renal function before dosing again  -Continue GDMT with Coreg 3.125 mg twice daily -Hold ramipril 10 mg with diuresis.  Consider change to ARNI, and addition of MRA, SGLT2i pending clinical course as BP and renal function allows -Strict I's/O, discussed salt and fluid restriction at length -anticipate 2-3 more days of diuresis   # AKI on CKD 3 Cr trend with diuresis 2.18--2.37--2.26--2.15, current GFR 30, CR ranged from the past 6 months from 1.8-2.44 per review of his outpatient labs.  Monitor closely with diuresis  # Paroxysmal AF s/p DCCV 2021 Currently in sinus rhythm on telemetry, rate controlled on carvedilol 3.125 mg twice daily, rhythm controlled on amiodarone 200 mg once daily. -On dose reduced Eliquis 2.5 mg twice daily due to his age >70, and creatinine >1.5.  CHA2DS2-VASc 6 (age, CHF, HTN, CAD, DM2)   # CAD s/p CABG x 2 (1991) # Demand ischemia Currently denies chest pain, troponins on admission barely elevated at 22, 23, which is most consistent with demand/supply mismatch and not ACS.   This patient's plan of care was discussed and created with Dr. Clayborn Bigness and he is in  agreement.  Signed: Tristan Schroeder , PA-C 06/01/2022, 8:26 AM [facility]

## 2022-06-01 NOTE — TOC Initial Note (Addendum)
Transition of Care [facility]) - Initial/Assessment Note    Patient Details  Name: Randall Vincent MRN: [id number] Date of Birth: Dec 04, 1940  Transition of Care [facility]) CM/SW Contact:    Candie Chroman, LCSW Phone Number: 06/01/2022, 2:19 PM  Clinical Narrative:  Readmission prevention screen complete. CSW met with patient. No supports at bedside. CSW introduced role and explained that discharge planning would be discussed. PCP is Elio Forget, MD in [facility]. Patient drives himself to appointments. He uses [facility]. No issues obtaining medications. Patient lives in an [place] alone behind his son and daughter-in-law's house. No home health or DME use prior to admission. Patient currently on acute oxygen. Will follow for this potential need. PT recommending outpatient PT and patient is agreeable. He prefers [facility]. Will have MD sign order and fax to them. No further concerns. CSW encouraged patient to contact CSW as needed. CSW will continue to follow patient for support and facilitate return home once stable.          3:24 pm: Faxed outpatient PT referral to [facility]        Expected Discharge Plan: OP Rehab Barriers to Discharge: Continued Medical Work up   Patient Goals and CMS Choice     Choice offered to / list presented to : Patient      Expected Discharge Plan and Services     Post Acute Care Choice:  (Outpatient PT) Living arrangements for the past 2 months: Mobile Home                                      Prior Living Arrangements/Services Living arrangements for the past 2 months: Mobile Home Lives with:: Self (Behind son and daughter-in-law) Patient language and need for interpreter reviewed:: Yes Do you feel safe going back to the place where you live?: Yes      Need for Family Participation in Patient Care: Yes (Comment) Care giver support system in place?: Yes (comment)   Criminal Activity/Legal Involvement Pertinent to Current  Situation/Hospitalization: No - Comment as needed  Activities of Daily Living Home Assistive Devices/Equipment: Cane (specify quad or straight), Walker (specify type), Eyeglasses ADL Screening (condition at time of admission) Patient's cognitive ability adequate to safely complete daily activities?: Yes Is the patient deaf or have difficulty hearing?: No Does the patient have difficulty seeing, even when wearing glasses/contacts?: No Does the patient have difficulty concentrating, remembering, or making decisions?: No Patient able to express need for assistance with ADLs?: Yes Does the patient have difficulty dressing or bathing?: No Independently performs ADLs?: Yes (appropriate for developmental age) Does the patient have difficulty walking or climbing stairs?: No Weakness of Legs: None Weakness of Arms/Hands: None  Permission Sought/Granted                  Emotional Assessment Appearance:: Appears stated age Attitude/Demeanor/Rapport: Engaged, Gracious Affect (typically observed): Accepting, Appropriate, Calm, Pleasant Orientation: : Oriented to Self, Oriented to Place, Oriented to  Time, Oriented to Situation Alcohol / Substance Use: Not Applicable Psych Involvement: No (comment)  Admission diagnosis:  Acute on chronic heart failure (HCC) [I50.9] Acute on chronic congestive heart failure, unspecified heart failure type [facility]) [I50.9] Patient Active Problem List   Diagnosis Date Noted   Acute on chronic heart failure ([place]) 05/29/2022   Leg pain 05/04/2022   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 ([place]) 05/01/2022   Anemia due to blood loss, acute 05/01/2022   Swelling of knee joint 04/03/2022   Hypokalemia 12/22/2021   Skin irritation 12/22/2021   Symptomatic anemia 12/21/2021   Frailty 09/25/2021   Stage 3b chronic kidney disease ([place]) 09/25/2021   History of GI bleed    Hyperbilirubinemia    Gout of hand  08/27/2021   Status post revision of total knee replacement, left 05/31/2021   Closed nondisplaced fracture of styloid process of right radius with routine healing 02/24/2021   Pain in joint of right shoulder 02/24/2021   Primary osteoarthritis of right shoulder 02/24/2021   Callus of foot 07/29/2020   Hammertoes of both feet 07/29/2020   Onychomycosis due to dermatophyte 07/29/2020   Adult BMI 33.0-33.9 kg/sq m 04/05/2020   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   CHF (congestive heart failure) ([place]) 02/10/2020   CHF exacerbation ([place]) 02/03/2020   Acute exacerbation of CHF (congestive heart failure) ([place]) 02/02/2020   Pressure injury of skin 01/30/2020   Acute on chronic diastolic CHF (congestive heart failure) ([place]) 01/27/2020   Acute kidney injury superimposed on chronic kidney disease ([place]) 01/27/2020   A-fib ([place])    Obesity, Class III, BMI 40-49.9 (morbid obesity) ([place])    Diverticulosis of colon without hemorrhage    Intestinal bypass or anastomosis status    Melena 01/10/2020   Chronic anemia 01/10/2020   Acute blood loss anemia 01/10/2020   Suspect Septic arthritis ([place]) 01/10/2020   Chronic anticoagulation 01/10/2020   History of bilateral knee replacement 01/10/2020   Stage 3b chronic kidney disease (CKD) ([place]) 01/10/2020   Acute on chronic congestive heart failure ([place]) 01/10/2020   Elevated troponin 01/10/2020   Hyperlipidemia associated with type 2 diabetes mellitus ([place]) 07/04/2019   CAD, multiple vessel 06/26/2019   CAD 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/26/2019   Atrial fibrillation status post cardioversion 10/22/19 ([place]) 06/26/2019   Essential hypertension 06/26/2019   DM (diabetes mellitus), type 2 ([place]) 06/26/2019   Anginal equivalent 06/26/2019   Drug-induced constipation 11/02/2018   Low serum vitamin B12 11/02/2018   Venous stasis dermatitis of both lower extremities 11/17/2017   Ventral hernia without obstruction or gangrene 09/08/2016   Peripheral edema 04/22/2016   Hay fever  08/13/2015   Impacted cerumen of both ears 08/13/2015   Seasonal allergic rhinitis due to pollen 08/13/2015   Bilateral edema of lower extremity 08/05/2015   Localized edema 06/17/2015   DOE (dyspnea on exertion) 05/19/2015   Hyperlipidemia 05/19/2015   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 05/03/2015   OSA on CPAP 05/03/2015   Obstructive sleep apnea 05/03/2015   PCP:  Dionne Bucy., MD Pharmacy:   [place], [address] [place] [address] Phone: [phone] Fax: [phone]     Social Determinants of Health (SDOH) Social History: SDOH Screenings   Food Insecurity: No Food Insecurity (05/30/2022)  Housing: Low Risk  (05/30/2022)  Transportation Needs: No Transportation Needs (05/30/2022)  Utilities: Not At Risk (05/30/2022)  Tobacco Use: Medium Risk (05/30/2022)   SDOH Interventions:     Readmission Risk Interventions    06/01/2022    2:17 PM  Readmission Risk Prevention Plan  Transportation Screening Complete  PCP or Specialist Appt within 3-5 Days Complete  Social Work Consult for [place] Planning/Counseling Complete  Palliative Care Screening Not Applicable  Medication Review Press photographer) Complete

## 2022-06-01 NOTE — TOC CM/SW Note (Signed)
     [facility] REFERRAL        Occupational Therapy * Physical Therapy * Speech Therapy                           DATE: 06/01/2021  PATIENT NAME: Randall Vincent PATIENT MRN: [id number]        DIAGNOSIS/DIAGNOSIS CODE: I50.9, I48.91, I87.2   DATE OF DISCHARGE: TBD       PRIMARY CARE PHYSICIAN  Elio Forget, MD    PCP PHONE/FAXMamie Nick: [phone]     Dear Provider (Name: [place]  Fax: [phone]   I certify that I have examined this patient and that occupational/physical/speech therapy is necessary on an outpatient basis.    The patient has expressed interest in completing their recommended course of therapy at your  location.  Once a formal order from the patient's primary care physician has been obtained, please  contact him/her to schedule an appointment for evaluation at your earliest convenience.   [x]   Physical Therapy Evaluate and Treat  [  ]  Occupational Therapy Evaluate and Treat  [  ]  Speech Therapy Evaluate and Treat         The patient's primary care physician (listed above) must furnish and be responsible for a formal order such that the recommended services may be furnished while under the primary physician's care, and that the plan of care will be established and reviewed every 30 days (or more often if condition necessitates).

## 2022-06-02 DIAGNOSIS — I5023 Acute on chronic systolic (congestive) heart failure: Secondary | ICD-10-CM | POA: Diagnosis not present

## 2022-06-02 LAB — OCCULT BLOOD X 1 CARD TO LAB, STOOL: Fecal Occult Bld: POSITIVE — AB

## 2022-06-02 LAB — GLUCOSE, CAPILLARY
Glucose-Capillary: 85 mg/dL (ref 70–99)
Glucose-Capillary: 87 mg/dL (ref 70–99)
Glucose-Capillary: 108 mg/dL — ABNORMAL HIGH (ref 70–99)
Glucose-Capillary: 123 mg/dL — ABNORMAL HIGH (ref 70–99)

## 2022-06-02 LAB — CBC
HCT: 25.6 % — ABNORMAL LOW (ref 39.0–52.0)
Hemoglobin: 7.6 g/dL — ABNORMAL LOW (ref 13.0–17.0)
MCH: 27 pg (ref 26.0–34.0)
MCHC: 29.7 g/dL — ABNORMAL LOW (ref 30.0–36.0)
MCV: 90.8 fL (ref 80.0–100.0)
Platelets: 109 10*3/uL — ABNORMAL LOW (ref 150–400)
RBC: 2.82 MIL/uL — ABNORMAL LOW (ref 4.22–5.81)
RDW: 18.3 % — ABNORMAL HIGH (ref 11.5–15.5)
WBC: 3.5 10*3/uL — ABNORMAL LOW (ref 4.0–10.5)
nRBC: 0 % (ref 0.0–0.2)

## 2022-06-02 LAB — BASIC METABOLIC PANEL
Anion gap: 7 (ref 5–15)
BUN: 44 mg/dL — ABNORMAL HIGH (ref 8–23)
CO2: 32 mmol/L (ref 22–32)
Calcium: 7.6 mg/dL — ABNORMAL LOW (ref 8.9–10.3)
Chloride: 101 mmol/L (ref 98–111)
Creatinine, Ser: 2 mg/dL — ABNORMAL HIGH (ref 0.61–1.24)
GFR, Estimated: 33 mL/min — ABNORMAL LOW (ref >60)
Glucose, Bld: 100 mg/dL — ABNORMAL HIGH (ref 70–99)
Potassium: 3.8 mmol/L (ref 3.5–5.1)
Sodium: 140 mmol/L (ref 135–145)

## 2022-06-02 LAB — IRON AND TIBC
Iron: 16 ug/dL — ABNORMAL LOW (ref 45–182)
Saturation Ratios: 7 % — ABNORMAL LOW (ref 17.9–39.5)
TIBC: 241 ug/dL — ABNORMAL LOW (ref 250–450)
UIBC: 225 ug/dL

## 2022-06-02 LAB — VITAMIN B12: Vitamin B-12: 571 pg/mL (ref 180–914)

## 2022-06-02 LAB — FOLATE: Folate: 9.6 ng/mL (ref >5.9)

## 2022-06-02 LAB — HEMOGLOBIN AND HEMATOCRIT, BLOOD
HCT: 28.1 % — ABNORMAL LOW (ref 39.0–52.0)
Hemoglobin: 8.4 g/dL — ABNORMAL LOW (ref 13.0–17.0)

## 2022-06-02 LAB — PHOSPHORUS: Phosphorus: 3.4 mg/dL (ref 2.5–4.6)

## 2022-06-02 LAB — MAGNESIUM: Magnesium: 2.2 mg/dL (ref 1.7–2.4)

## 2022-06-02 MED ORDER — BISACODYL 5 MG PO TBEC
5.0000 mg | DELAYED_RELEASE_TABLET | Freq: Every day | ORAL | Status: DC
  Administered 2022-06-07 – 2022-06-10 (×2): 5 mg via ORAL
  Filled 2022-06-02 (×5): qty 1

## 2022-06-02 MED ORDER — POLYETHYLENE GLYCOL 3350 17 G PO PACK
17.0000 g | PACK | Freq: Every day | ORAL | Status: DC
  Filled 2022-06-02 (×6): qty 1

## 2022-06-02 MED ORDER — SPIRONOLACTONE 12.5 MG HALF TABLET
12.5000 mg | ORAL_TABLET | Freq: Every day | ORAL | Status: DC
  Administered 2022-06-02 – 2022-06-10 (×9): 12.5 mg via ORAL
  Filled 2022-06-02 (×10): qty 1

## 2022-06-02 NOTE — Progress Notes (Signed)
Mobility Specialist - Progress Note   06/02/22 1111  Mobility  Activity Refused mobility  $Mobility charge 1 Mobility   Pt refuses mobility this AM, no specific reason given. Will attempt again at another date and time.   Gretchen Short  Mobility Specialist  06/02/22 11:12 AM

## 2022-06-02 NOTE — Progress Notes (Signed)
   Heart Failure Nurse Navigator Note  Met with patient this morning he is lying in bed in no acute distress.  He has a congested sounding cough.  Also states that his blood count is dropping and as of yet they do not know why.  Discussed fluid restriction and sodium restriction.  Patient states that is hard when he is having to eat by himself.  Discussed making wiser choices and reading labels when having to pick foods that are easier for him to fix.  Also reinforced weighing daily, reporting changes and weight and also reporting changes in symptoms.  He states when he is discharged from the hospital he is going to rehab for strengthening.  He had no further questions.  Pricilla Riffle RN CHFN

## 2022-06-02 NOTE — Progress Notes (Signed)
[facility] Progress Note  Patient: Randall Vincent    GMW:[id number]  DOA: 05/29/2022     Date of Service: the patient was seen and examined on 06/02/2022  Chief Complaint  Patient presents with   Shortness of Breath   Brief hospital course: Randall Vincent is a 82 y.o. 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 ED w/up: Chest x-ray was obtained that demonstrated interstitial edema, mild, with bilateral pleural effusions. Due to failure of outpatient treatment, [facility] contacted for admission for acute on chronic heart failure.    Assessment and Plan:  # Acute on chronic systolic heart failure  Patient has a history of moderately reduced EF with last EF of 45-50%, follows with Dr. Clayborn Bigness.  Despite increasing doses of home torsemide, patient presents with marked pitting edema extending up to the mid abdomen. - S/p Lasix 60 mg IV once and  Lasix 40 mg IV BID On 1/8 started Lasix IV infusion due to anasarca - Strict in and out - Daily weights - Continue home GDMT TTE shows LVEF 30 to 44%, grade 3 diastolic dysfunction, right ventricular failure, moderate pulm hypertension, moderate MR and moderate to severe TR Cardiology consulted for further recommendation. 1/10 metolazone 2.5 mg p.o. x 1 dose ordered by cardiology 1/11 started spironolactone 12.5 mg p.o. daily as per cardiology  Stage 3b chronic kidney disease (CKD) Patient's renal function frequently fluctuates between 1.6 and 2.2.   Currently 2.18, previously 1.64.  I wonder if there is a component of cardiorenal syndrome.  Will monitor closely while diuresing. - Daily BMP - Monitor urine output   Atrial fibrillation status post cardioversion 10/22/19  Currently rate controlled. Continue  home amiodarone, Eliquis, and Coreg   Venous stasis dermatitis of both lower extremities Per Dr. Etta Quill most recent note, plans were to obtain ABIs.  Will do so at this time and if no evidence of PAD, plan to order Unna boots ABI:  Right ABI:  1.47 and Left ABI:  1.55 Right Lower Extremity: Segmental Doppler at the right ankle demonstrates monophasic waveforms Left Lower Extremity: Segmental Doppler at the left ankle demonstrates triphasic posterior tibial and dorsalis pedis waveform. - pt may need Unna boots, will reassess after diuresis   Essential hypertension - Continue home antihypertensives   DM (diabetes mellitus), type 2 ([place]) Well-managed with semaglutide with last A1c of 5.9% 4 weeks ago. - Hold home semaglutide while admitted - SSI, moderate    Body mass index is 35.14 kg/m.  Interventions:     Diet: Heart healthy diet, fluid striction 1.5 L/day DVT Prophylaxis: Therapeutic Anticoagulation with Eliquis    Advance goals of care discussion: DNR  Family Communication: family was not present at bedside, at the time of interview.  The pt provided permission to discuss medical plan with the family. Opportunity was given to ask question and all questions were answered satisfactorily.   Disposition:  Pt is from Home, admitted with volume overload on IV Lasix infusion, still has anasarca, which precludes a safe discharge. Discharge to home, when clinically stable, may need 2-3 days for 2 diuresis.    Subjective: No significant overnight events, patient feels improvement in the bilateral lower extremity edema, still has significant abdominal wall edema.  Breathing is getting better, denies any active issues.  Physical Exam: General: NAD, lying comfortably, mild SOB Appear in no distress, affect appropriate Eyes: Right eye PERRLA, (left eye blind since childhood) ENT: Oral Mucosa Clear, moist  Neck: no JVD,  Cardiovascular: S1 and S2 Present, no Murmur,   Respiratory: good respiratory effort, Bilateral Air entry equal and Decreased, mild bilateral crackles, no wheezes Abdomen: Bowel Sound present, Soft and no tenderness, edematous abdominal wall Skin: no rashes Extremities: 3 + Pedal edema, no calf tenderness, chronic venous stasis pigmentation Neurologic: without any new focal findings Gait not checked due to patient safety concerns  Vitals:   06/02/22 0251 06/02/22 0728 06/02/22 1125 06/02/22 1627  BP: (!) 99/56 121/69 (!) 98/51 (!) 114/52  Pulse: 74 78 72 74  Resp: 16 18 18 18   Temp: 98.3 F (36.8 C) 97.8 F (36.6 C) 98 F (36.7 C) 98.2 F (36.8 C)  TempSrc: Oral Oral    SpO2: 97% 98% 91% 95%  Weight:      Height:        Intake/Output Summary (Last 24 hours) at 06/02/2022 1631 Last data filed at 06/02/2022 1629 Gross per 24 hour  Intake 648.45 ml  Output 2600 ml  Net -1951.55 ml   Filed Weights   05/29/22 1244 05/31/22 2005 06/01/22 0503  Weight: 104.8 kg 103.1 kg 102.4 kg    Data Reviewed: I have personally reviewed and interpreted daily labs, tele strips, imagings as discussed above. I reviewed all nursing notes, pharmacy notes, vitals, pertinent old records I have discussed plan of care as described above with RN and patient/family.  CBC: Recent Labs  Lab 05/29/22 0949 05/30/22 0225 05/31/22 0500 06/01/22 0451 06/02/22 0242 06/02/22 1542  WBC 4.8 4.8 6.0 5.9 3.5*  --   NEUTROABS  --  3.6  --   --   --   --   HGB 9.3* 7.9* 8.8* 8.5* 7.6* 8.4*  HCT 31.8* 26.8* 30.4* 29.1* 25.6* 28.1*  MCV 90.9 91.2 92.7 91.8 90.8  --   PLT 170 150 164 164 109*  --    Basic Metabolic Panel: Recent Labs  Lab 05/29/22 0949 05/30/22 0225 05/31/22 0500 06/01/22 0451 06/02/22 0242  NA 140 140 138 140 140  K 4.3 4.0 4.5 3.9 3.8  CL 104 105 103 103 101  CO2 28 29 28  32 32  GLUCOSE 109* 101* 97 103* 100*  BUN 48* 48* 46* 45* 44*  CREATININE 2.18* 2.37* 2.26* 2.15* 2.00*  CALCIUM 8.2* 7.7* 7.8* 8.0* 7.6*  MG 2.4 2.2  2.4 2.4 2.2  PHOS  --   --  4.6 4.1 3.4    Studies: No results found.  Scheduled Meds:  amiodarone  200 mg Oral Daily   apixaban  2.5 mg Oral BID   atorvastatin  40 mg Oral QPM   bisacodyl  5 mg Oral QHS   carvedilol  3.125 mg Oral BID   insulin aspart  0-15 Units Subcutaneous TID WC   polyethylene glycol  17 g Oral Daily   spironolactone  12.5 mg Oral Daily   Vitamin D (Ergocalciferol)  50,000 Units Oral Q7 days   Continuous Infusions:  furosemide (LASIX) 200 mg in dextrose 5 % 100 mL (2 mg/mL) infusion 8 mg/hr (06/02/22 1600)   PRN Meds: acetaminophen, benzonatate, ondansetron (ZOFRAN) IV  Time spent: 50 minutes  Author: Val Riles. MD [facility] 06/02/2022 4:31 PM  To reach On-call, see care teams to locate the attending and reach out to them via [email or web address]. If 7PM-7AM, please contact night-coverage  If you still have difficulty reaching the attending provider, please page the [facility] (Director on Call) for [facility] on amion for assistance.

## 2022-06-02 NOTE — Plan of Care (Signed)

## 2022-06-02 NOTE — Progress Notes (Signed)
Physical Therapy Treatment Patient Details Name: Randall Vincent MRN: [id number] DOB: June 26, 1940 Today's Date: 06/02/2022   History of Present Illness Patient is an 82 year old male who presents to ED for SOB. Patient has had gradually worsening LE swelling that reached to his abdomen. 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    PT Comments    Pt seen for PT tx with pt received in bed, on 3L/min via nasal cannula throughout session. Pt declines OOB mobility 2/2 already sitting in recliner today & it making his back hurt. Pt is agreeable to bed level exercises & performs them with cuing for technique & rest breaks PRN. Pt educated pt on importance of OOB mobility while in acute setting to prevent decline in functional mobility. Pt reports he will walk tomorrow.    Recommendations for follow up therapy are one component of a multi-disciplinary discharge planning process, led by the attending physician.  Recommendations may be updated based on patient status, additional functional criteria and insurance authorization.  Follow Up Recommendations  Outpatient PT     Assistance Recommended at Discharge Set up Supervision/Assistance  Patient can return home with the following Assistance with cooking/housework;A little help with bathing/dressing/bathroom;Assist for transportation;Help with stairs or ramp for entrance   Equipment Recommendations  None recommended by PT    Recommendations for Other Services       Precautions / Restrictions Precautions Precautions: Fall Restrictions Weight Bearing Restrictions: No     Mobility  Bed Mobility                    Transfers                        Ambulation/Gait                   Stairs             Wheelchair Mobility    Modified Rankin (Stroke Patients Only)       Balance                                             Cognition Arousal/Alertness: Awake/alert Behavior During Therapy: WFL for tasks assessed/performed Overall Cognitive Status: Within Functional Limits for tasks assessed                                          Exercises General Exercises - Lower Extremity Hip ABduction/ADduction: AROM, Strengthening, Both, 10 reps, Supine (hip abduction slides x 10, hip adduction pillow squeezes x 10) Straight Leg Raises: AROM, Supine, Strengthening, Both, 10 reps    General Comments        Pertinent Vitals/Pain Pain Assessment Pain Assessment: Faces Faces Pain Scale: Hurts a little bit Pain Location: RLE Pain Descriptors / Indicators: Discomfort Pain Intervention(s): Monitored during session    Home Living                          Prior Function            PT Goals (current goals can now be found in the care plan section) Acute Rehab PT Goals Patient  Stated Goal: to return home PT Goal Formulation: With patient Time For Goal Achievement: 06/12/22 Potential to Achieve Goals: Fair Progress towards PT goals: PT to reassess next treatment    Frequency    Min 2X/week      PT Plan Current plan remains appropriate    Co-evaluation              AM-PAC PT "6 Clicks" Mobility   Outcome Measure  Help needed turning from your back to your side while in a flat bed without using bedrails?: None Help needed moving from lying on your back to sitting on the side of a flat bed without using bedrails?: A Little Help needed moving to and from a bed to a chair (including a wheelchair)?: None Help needed standing up from a chair using your arms (e.g., wheelchair or bedside chair)?: A Little Help needed to walk in hospital room?: A Little Help needed climbing 3-5 steps with a railing? : A Little 6 Click Score: 20    End of Session Equipment Utilized During Treatment: Oxygen Activity Tolerance: Patient tolerated treatment well Patient left: in bed;with call  bell/phone within reach;with bed alarm set   PT Visit Diagnosis: Unsteadiness on feet (R26.81);Other abnormalities of gait and mobility (R26.89);Repeated falls (R29.6);Muscle weakness (generalized) (M62.81);History of falling (Z91.81);Difficulty in walking, not elsewhere classified (R26.2)     Time: 1696-7893 PT Time Calculation (min) (ACUTE ONLY): 10 min  Charges:  $Therapeutic Activity: 8-22 mins                     Lavone Nian, PT, DPT 06/02/22, 3:04 PM   Waunita Schooner 06/02/2022, 3:03 PM

## 2022-06-02 NOTE — Progress Notes (Signed)
[facility] CLINIC CARDIOLOGY CONSULT NOTE       Patient ID: Randall Vincent MRN: [id number] DOB/AGE: 1940-08-13 82 y.o.  Admit date: 05/29/2022 Referring Physician Dr. Gillis Santa Primary Physician Stephens November, PA-C [facility])  Primary Cardiologist Dr. Juliann Pares Reason for Consultation AoCHF  HPI: Randall Vincent is a 81yoM with a PMH of CAD s/p CABG x2 1991 and BMS, paroxysmal AF s/p DCCV 2021 on Eliquis, HFpEF (LVEF 45-50%, mi-mod MR, mod TR 08/2021), HTN, DM2, CKD3, who presented to [facility] ED 05/29/22 with worsening shortness of breath and peripheral edema extending up to his thighs and abdomen. Cardiology is consulted on hospital day 2 for further assistance with his HF. Echo this admission revealed a worsening EF of 30-35%, g3DD, and mod reduced LV function, severely reduced RV function  Interval History:  -Continues to feel weak and refuses mobility, peripheral and abdominal edema have improved greatly but remain present. -No chest pain or shortness of breath, remains on a little supplemental oxygen - diuresing well, net neg 4.3L, on lasix gtt at 8mg /hr. Renal function improving  Review of systems complete and found to be negative unless listed above     Past Medical History:  Diagnosis Date   A-fib (HCC)    a.) CHA2DS2-VASc = 6 (age x 2, CHF, HTN, previous MI, T2DM). b.) Rate/rhythm maintained on amiodarone + diltiazem + carvedilol; chronically anticoagulated using dabigatran. c.) DCCV 07/10/2015, 08/08/2019, 10/22/2019.   Arthritis    Chest pain 06/26/2019   CHF (congestive heart failure) (HCC)    CKD (chronic kidney disease), stage III (HCC)    Coronary artery disease 1991   a.) 2v CABG in 1991. b.) PCI 06/07/2006 --> Vision stents to native LCx and RCA   Hyperlipidemia    Hypertension    Long term current use of anticoagulant    a.) dabigatran   Myocardial infarction (HCC) 1991   a.) LHC --> 50% and 90% LAD lesions; referred to CVTS. b.) ultimately underwent 2v CABG  (LIMA-LAD, SVG-RCA)   OSA on CPAP    S/P CABG x 2 01/02/1990   a.) LIMA-LAD, SVG-RCA   T2DM (type 2 diabetes mellitus) (HCC)     Past Surgical History:  Procedure Laterality Date   BACK SURGERY  1970   L5-L6 ruptured disk   BLADDER SURGERY  2000   CARDIOVERSION N/A 07/10/2015   CARDIOVERSION N/A 08/08/2019   CARDIOVERSION N/A 10/22/2019   COLON SURGERY     COLONOSCOPY N/A 01/15/2020   Procedure: COLONOSCOPY;  Surgeon: 01/17/2020, MD;  Location: [facility] ENDOSCOPY;  Service: Endoscopy;  Laterality: N/A;   COLONOSCOPY N/A 01/16/2020   Procedure: COLONOSCOPY;  Surgeon: 01/18/2020, MD;  Location: [facility] ENDOSCOPY;  Service: Endoscopy;  Laterality: N/A;   CORONARY ARTERY BYPASS GRAFT N/A 01/02/1990   Procedure: 2v CORONARY ARTERY BYPASS GRAFT (LIMA-LAD, SVG-RCA)   ESOPHAGOGASTRODUODENOSCOPY N/A 01/13/2020   Procedure: ESOPHAGOGASTRODUODENOSCOPY (EGD);  Surgeon: 01/15/2020, MD;  Location: [facility] ENDOSCOPY;  Service: Endoscopy;  Laterality: N/A;   REPLACEMENT TOTAL KNEE BILATERAL Bilateral 2008   STENT PLACEMENT VASCULAR ([facility] HX)  2008   TOTAL KNEE REVISION Left 01/16/2020   Procedure: TOTAL KNEE REVISION;  Surgeon: 01/18/2020, MD;  Location: [facility] ORS;  Service: Orthopedics;  Laterality: Left;   TOTAL KNEE REVISION Left 05/31/2021   Procedure: TOTAL KNEE REVISION;  Surgeon: 07/29/2021, MD;  Location: [facility] ORS;  Service: Orthopedics;  Laterality: Left;    Medications Prior to Admission  Medication Sig Dispense Refill Last  Dose   amiodarone (PACERONE) 200 MG tablet Take 200 mg by mouth daily.   05/29/2022   apixaban (ELIQUIS) 2.5 MG TABS tablet Take 2.5 mg by mouth 2 (two) times daily.   05/29/2022   atorvastatin (LIPITOR) 40 MG tablet Take 40 mg by mouth every evening.    05/28/2022   carvedilol (COREG) 12.5 MG tablet Take 12.5 mg by mouth 2 (two) times daily.   05/29/2022   PROAIR RESPICLICK 108 (90 Base) MCG/ACT AEPB Inhale into the lungs.   prn   ramipril  (ALTACE) 10 MG capsule Take 1 capsule (10 mg total) by mouth daily. 30 capsule 1 05/29/2022   Semaglutide, 1 MG/DOSE, 2 MG/1.5ML SOPN Inject 1 mg into the skin every Thursday.   Past Month   torsemide (DEMADEX) 20 MG tablet Take 1 tablet (20 mg total) by mouth 2 (two) times daily. (Patient taking differently: Take 10-20 mg by mouth See admin instructions. Take 20 mg by mouth in the morning and 10 mg in the evening) 60 tablet 1 05/29/2022   vitamin B-12 (CYANOCOBALAMIN) 1000 MCG tablet Take 1,000 mcg by mouth daily.   05/29/2022   apixaban (ELIQUIS) 5 MG TABS tablet Take 1 tablet (5 mg total) by mouth 2 (two) times daily. (Patient not taking: Reported on 05/29/2022) 60 tablet 3 Not Taking   carvedilol (COREG) 3.125 MG tablet Take 1 tablet (3.125 mg total) by mouth 2 (two) times daily with a meal. (Patient not taking: Reported on 05/29/2022) 60 tablet 1 Not Taking   colchicine 0.6 MG tablet Take 0.6 mg by mouth 2 (two) times daily. (Patient not taking: Reported on 05/05/2022)      diltiazem (CARDIZEM CD) 240 MG 24 hr capsule Take 240 mg by mouth daily. (Patient not taking: Reported on 05/29/2022)   Not Taking   metFORMIN (GLUCOPHAGE-XR) 500 MG 24 hr tablet Take 500 mg by mouth 2 (two) times daily. (Patient not taking: Reported on 05/05/2022)       Social History   Socioeconomic History   Marital status: Widowed    Spouse name: Not on file   Number of children: Not on file   Years of education: Not on file   Highest education level: Not on file  Occupational History   Not on file  Tobacco Use   Smoking status: Former    Types: Cigarettes    Quit date: 70    Years since quitting: 33.0   Smokeless tobacco: Never  Vaping Use   Vaping Use: Never used  Substance and Sexual Activity   Alcohol use: Not Currently    Alcohol/week: 1.0 standard drink of alcohol    Types: 1 Cans of beer per week    Comment: very rarely   Drug use: Never   Sexual activity: Not on file  Other Topics Concern   Not on file   Social History Narrative   Not on file   Social Determinants of Health   Financial Resource Strain: Not on file  Food Insecurity: No Food Insecurity (05/30/2022)   Hunger Vital Sign    Worried About Running Out of Food in the Last Year: Never true    Ran Out of Food in the Last Year: Never true  Transportation Needs: No Transportation Needs (05/30/2022)   PRAPARE - Administrator, Civil Service (Medical): No    Lack of Transportation (Non-Medical): No  Physical Activity: Not on file  Stress: Not on file  Social Connections: Not on file  Intimate Partner  Violence: Not At Risk (05/30/2022)   Humiliation, Afraid, Rape, and Kick questionnaire    Fear of Current or Ex-Partner: No    Emotionally Abused: No    Physically Abused: No    Sexually Abused: No    History reviewed. No pertinent family history.    Intake/Output Summary (Last 24 hours) at 06/02/2022 0902 Last data filed at 06/02/2022 0728 Gross per 24 hour  Intake 394.83 ml  Output 2700 ml  Net -2305.17 ml     Vitals:   06/01/22 2136 06/02/22 0001 06/02/22 0251 06/02/22 0728  BP: (!) 119/57 (!) 120/56 (!) 99/56 121/69  Pulse: 83 78 74 78  Resp:  20 16 18   Temp:  98.6 F (37 C) 98.3 F (36.8 C) 97.8 F (36.6 C)  TempSrc:  Oral Oral Oral  SpO2:  90% 97% 98%  Weight:      Height:        PHYSICAL EXAM General: elderly and ill appearing caucasian male , well nourished, in no acute distress. Sitting upright in hospital bed in PCU HEENT:  Normocephalic and atraumatic. Neck:  No JVD.  Lungs: slight conversational dyspnea on O2 by [place]. Decreased breath sounds with right basilar crackles Heart: HRRR . Normal S1 and S2 without gallops or murmurs.  Abdomen: mildly distended appearing with pitting edema on both lower flanks and along pannus with mild erythema.  Msk: Normal strength and tone for age. Extremities: chronic venous stasis hyperpigmentation with RLE dried blood on his shin underneath bandage.  Trace edema  bilaterally that is improving Neuro: Alert and oriented X 3. Psych:  Answers questions appropriately.   Labs: Basic Metabolic Panel: Recent Labs    06/01/22 0451 06/02/22 0242  NA 140 140  K 3.9 3.8  CL 103 101  CO2 32 32  GLUCOSE 103* 100*  BUN 45* 44*  CREATININE 2.15* 2.00*  CALCIUM 8.0* 7.6*  MG 2.4 2.2  PHOS 4.1 3.4    Liver Function Tests: No results for input(s): "AST", "ALT", "ALKPHOS", "BILITOT", "PROT", "ALBUMIN" in the last 72 hours.  No results for input(s): "LIPASE", "AMYLASE" in the last 72 hours. CBC: Recent Labs    06/01/22 0451 06/02/22 0242  WBC 5.9 3.5*  HGB 8.5* 7.6*  HCT 29.1* 25.6*  MCV 91.8 90.8  PLT 164 109*    Cardiac Enzymes: No results for input(s): "CKTOTAL", "CKMB", "CKMBINDEX", "TROPONINIHS" in the last 72 hours.  BNP: No results for input(s): "BNP" in the last 72 hours.  D-Dimer: No results for input(s): "DDIMER" in the last 72 hours. Hemoglobin A1C: No results for input(s): "HGBA1C" in the last 72 hours. Fasting Lipid Panel: No results for input(s): "CHOL", "HDL", "LDLCALC", "TRIG", "CHOLHDL", "LDLDIRECT" in the last 72 hours. Thyroid Function Tests: No results for input(s): "TSH", "T4TOTAL", "T3FREE", "THYROIDAB" in the last 72 hours.  Invalid input(s): "FREET3" Anemia Panel: No results for input(s): "VITAMINB12", "FOLATE", "FERRITIN", "TIBC", "IRON", "RETICCTPCT" in the last 72 hours.   Vincent: ECHOCARDIOGRAM COMPLETE  Result Date: 05/30/2022    ECHOCARDIOGRAM REPORT   Patient Name:   Randall Vincent Date of Exam: 05/30/2022 Medical Rec #:  [id number]     Height:       68.0 in Accession #:    [id number]    Weight:       231.1 lb Date of Birth:  [birth year]      BSA:          2.173 m Patient Age:    19 years  BP:           114/55 mmHg Patient Gender: M             HR:           53 bpm. Exam Location:  [facility] Procedure: 2D Echo, Cardiac Doppler and Color Doppler Indications:    CHF- Acute Systolic I50.21  History:        Patient  has prior history of Echocardiogram examinations, most                 recent 09/18/2021. CHF, CAD and Previous Myocardial Infarction,                 Prior CABG, Arrythmias:Atrial Fibrillation, Signs/Symptoms:Chest                 Pain; Risk Factors:Dyslipidemia, Hypertension, Sleep Apnea and                 Diabetes. CKD, stage III.  Sonographer:    Randall Vincent Referring Phys: [id number] IULIA BASARABA IMPRESSIONS  1. Left ventricular ejection fraction, by estimation, is 30 to 35%. The left ventricle has moderately decreased function. Left ventricular endocardial border not optimally defined to evaluate regional wall motion. The left ventricular internal cavity size was mildly dilated. There is moderate left ventricular hypertrophy. Left ventricular diastolic parameters are consistent with Grade III diastolic dysfunction (restrictive).  2. Right ventricular systolic function is severely reduced. The right ventricular size is mildly enlarged. There is moderately elevated pulmonary artery systolic pressure.  3. Left atrial size was moderately dilated.  4. Right atrial size was moderately dilated.  5. The mitral valve is abnormal. Moderate mitral valve regurgitation. No evidence of mitral stenosis. Moderate mitral annular calcification.  6. Tricuspid valve regurgitation is moderate to severe.  7. The aortic valve is normal in structure. Aortic valve regurgitation is not visualized. Aortic valve sclerosis is present, with no evidence of aortic valve stenosis.  8. Moderately dilated pulmonary artery.  9. The inferior vena cava is dilated in size with <50% respiratory variability, suggesting right atrial pressure of 15 mmHg. FINDINGS  Left Ventricle: Left ventricular ejection fraction, by estimation, is 30 to 35%. The left ventricle has moderately decreased function. Left ventricular endocardial border not optimally defined to evaluate regional wall motion. The left ventricular internal cavity size was mildly dilated.  There is moderate left ventricular hypertrophy. Abnormal (paradoxical) septal motion consistent with post-operative status. Left ventricular diastolic parameters are consistent with Grade III diastolic dysfunction  (restrictive). Right Ventricle: The right ventricular size is mildly enlarged. No increase in right ventricular wall thickness. Right ventricular systolic function is severely reduced. There is moderately elevated pulmonary artery systolic pressure. The tricuspid regurgitant velocity is 2.92 m/s, and with an assumed right atrial pressure of 15 mmHg, the estimated right ventricular systolic pressure is 49.1 mmHg. Left Atrium: Left atrial size was moderately dilated. Right Atrium: Right atrial size was moderately dilated. Pericardium: There is no evidence of pericardial effusion. Mitral Valve: The mitral valve is abnormal. There is mild thickening of the mitral valve leaflet(s). Moderate mitral annular calcification. Moderate mitral valve regurgitation. No evidence of mitral valve stenosis. Tricuspid Valve: The tricuspid valve is normal in structure. Tricuspid valve regurgitation is moderate to severe. No evidence of tricuspid stenosis. Aortic Valve: The aortic valve is normal in structure. Aortic valve regurgitation is not visualized. Aortic valve sclerosis is present, with no evidence of aortic valve stenosis. Aortic valve mean gradient measures 3.5 mmHg. Aortic valve peak  gradient measures 6.5 mmHg. Aortic valve area, by VTI measures 3.06 cm. Pulmonic Valve: The pulmonic valve was normal in structure. Pulmonic valve regurgitation is mild to moderate. No evidence of pulmonic stenosis. Aorta: The aortic root is normal in size and structure. Pulmonary Artery: The pulmonary artery is moderately dilated. Venous: The inferior vena cava is dilated in size with less than 50% respiratory variability, suggesting right atrial pressure of 15 mmHg. IAS/Shunts: No atrial level shunt detected by color flow Doppler.   LEFT VENTRICLE PLAX 2D LVIDd:         5.50 cm   Diastology LVIDs:         4.60 cm   LV e' medial:    5.43 cm/s LV PW:         1.40 cm   LV E/e' medial:  22.1 LV IVS:        1.40 cm   LV e' lateral:   6.97 cm/s LVOT diam:     2.20 cm   LV E/e' lateral: 17.2 LV SV:         86 LV SV Index:   40 LVOT Area:     3.80 cm  RIGHT VENTRICLE            IVC RV S prime:     3.88 cm/s  IVC diam: 2.50 cm TAPSE (M-mode): 1.0 cm LEFT ATRIUM           Index        RIGHT ATRIUM           Index LA diam:      4.80 cm 2.21 cm/m   RA Area:     27.70 cm LA Vol (A2C): 55.9 ml 25.72 ml/m  RA Volume:   99.70 ml  45.88 ml/m LA Vol (A4C): 71.9 ml 33.08 ml/m  AORTIC VALVE                    PULMONIC VALVE AV Area (Vmax):    3.04 cm     PR End Diast Vel: 10.24 msec AV Area (Vmean):   3.04 cm AV Area (VTI):     3.06 cm AV Vmax:           127.50 cm/s AV Vmean:          84.500 cm/s AV VTI:            0.281 m AV Peak Grad:      6.5 mmHg AV Mean Grad:      3.5 mmHg LVOT Vmax:         102.00 cm/s LVOT Vmean:        67.600 cm/s LVOT VTI:          0.226 m LVOT/AV VTI ratio: 0.80  AORTA Ao Root diam: 3.30 cm Ao Asc diam:  3.50 cm MITRAL VALVE                TRICUSPID VALVE MV Area (PHT): 4.06 cm     TR Peak grad:   34.1 mmHg MV Decel Time: 187 msec     TR Vmax:        292.00 cm/s MR Peak grad: 66.3 mmHg MR Vmax:      407.00 cm/s   SHUNTS MV E velocity: 120.00 cm/s  Systemic VTI:  0.23 m MV A velocity: 47.40 cm/s   Systemic Diam: 2.20 cm MV E/A ratio:  2.53 Randall Bears MD Electronically signed by Randall Bears MD Signature Date/Time: 05/30/2022/4:25:38 PM  Final    US ARTERIAL ABI (SCREENING LOWER EXTREMITY)  Result Date: 05/30/2022 CLINICAL DATA:  82 year old male with a history of bilateral lower extremity edema EXAM: NONINVASIVE PHYSIOLOGIC VASCULAR STUDY OF BILATERAL LOWER EXTREMITIES TECHNIQUE: Evaluation of both lower extremities was performed at rest, including calculation of ankle-brachial indices, multiple segmental pressure  evaluation, segmental Doppler and segmental pulse volume recording. COMPARISON:  None Available. FINDINGS: Right ABI:  1.47 Left ABI:  1.55 Right Lower Extremity: Segmental Doppler at the right ankle demonstrates monophasic waveforms Left Lower Extremity: Segmental Doppler at the left ankle demonstrates triphasic posterior tibial and dorsalis pedis waveform. IMPRESSION: Right: Resting ABI is likely falsely elevated secondary to noncompressible vessels, given the segmental Doppler. Segmental Doppler demonstrates monophasic waveforms at the right ankle, indicating more proximal occlusive disease. Left: Resting ABI likely falsely elevated secondary to noncompressible vessels. Segmental Doppler at the left ankle demonstrates waveforms relatively maintained. Signed, Randall Vincent, Randall Vincent Electronically Signed   By: Gilmer Mor D.O.   On: 05/30/2022 10:56   DG Chest 2 View  Result Date: 05/29/2022 CLINICAL DATA:  Shortness of breath and ascites.  History of CHF. EXAM: CHEST - 2 VIEW COMPARISON:  09/16/2021 FINDINGS: Cardiac enlargement. Aortic atherosclerosis. Previous median sternotomy and CABG procedure. Bilateral pleural effusions, right greater than left. Mild interstitial edema. Atelectasis noted in both lung bases. No airspace opacities. IMPRESSION: 1. Mild congestive heart failure. 2. Bibasilar atelectasis. Electronically Signed   By: Signa Kell M.D.   On: 05/29/2022 10:27    ECHO 05/30/2022 1. Left ventricular ejection fraction, by estimation, is 30 to 35%. The  left ventricle has moderately decreased function. Left ventricular  endocardial border not optimally defined to evaluate regional wall motion.  The left ventricular internal cavity  size was mildly dilated. There is moderate left ventricular hypertrophy.  Left ventricular diastolic parameters are consistent with Grade III  diastolic dysfunction (restrictive).    2. Right ventricular systolic function is severely reduced. The right  ventricular size is mildly enlarged. There is moderately elevated  pulmonary artery systolic pressure.   3. Left atrial size was moderately dilated.   4. Right atrial size was moderately dilated.   5. The mitral valve is abnormal. Moderate mitral valve regurgitation. No  evidence of mitral stenosis. Moderate mitral annular calcification.   6. Tricuspid valve regurgitation is moderate to severe.   7. The aortic valve is normal in structure. Aortic valve regurgitation is  not visualized. Aortic valve sclerosis is present, with no evidence of  aortic valve stenosis.   8. Moderately dilated pulmonary artery.   9. The inferior vena cava is dilated in size with <50% respiratory  variability, suggesting right atrial pressure of 15 mmHg.   TELEMETRY reviewed by me (LT) 06/02/2022 : NSR rate 70s  EKG reviewed by me: NSR bifacicular block   Data reviewed by me (LT) 06/02/2022: hospitalist progress note, cbc, bmp, cxr, vitals, tele, I/o   Principal Problem:   Acute on chronic heart failure (HCC) Active Problems:   Atrial fibrillation status post cardioversion 10/22/19 [facility])   Essential hypertension   DM (diabetes mellitus), type 2 (HCC)   Stage 3b chronic kidney disease (CKD) (HCC)   Venous stasis dermatitis of both lower extremities    ASSESSMENT AND PLAN:  Maxden Naji is a 41yoM with a PMH of CAD s/p CABG x2 1991 and BMS, paroxysmal AF s/p DCCV 2021 on Eliquis, HFpEF (LVEF 45-50%, mi-mod MR, mod TR 08/2021),  HTN, DM2, CKD3, who presented to [facility] ED 05/29/22 with worsening shortness of breath and peripheral edema extending up to his thighs and abdomen. Cardiology is consulted on hospital day 2 for further assistance with his HF. Echo this admission revealed a worsening EF of 30-35%, g3DD, and mod reduced LV function, severely reduced RV function  # Acute on chronic HFrEF (30-35%, G3 DD 05/2022) Presents with several days of  worsening shortness of breath, abdominal distention, and peripheral edema, refractory to an extra dose of torsemide 20 mg the day prior to admission.  Admits to eating primarily frozen dinners and canned soup since his family who he usually eats with has been sick and they have not been eating together for the past 2 to 3 weeks.  BNP is elevated to 1349 on admission and is clinically hypervolemic with pitting edema from his feet up to his lower abdomen/flanks.  Clinical improvement after IV Lasix, now on a Lasix infusion with  Net IO Since Admission: -4,354.66 mL [06/02/22 0905]  -S/p IV Lasix 60 mg x 1, 40 mg x 2 -continue Lasix infusion 8 mg/h (Home diuretic torsemide 20 mg twice daily) -s/p metolazone 2.5mg  PO x1 1/10, consider dosing again tomorrow 1/12 -Continue GDMT with Coreg 3.125 mg twice daily -Add spironolactone 12.5 mg once daily -Hold ramipril 10 mg with diuresis.  Consider change to ARNI, SGLT2i pending clinical course as BP and renal function allows -Strict I's/O, discussed salt and fluid restriction at length -anticipate 2-3 more days of diuresis   # AKI on CKD 3 Cr trend with diuresis 2.18-2.37-2.26--2.15--2.00, current GFR 33, CR ranged from the past 6 months from 1.8-2.44 per review of his outpatient labs.  Monitor closely with diuresis  # Paroxysmal AF s/p DCCV 2021 Currently in sinus rhythm on telemetry, rate controlled on carvedilol 3.125 mg twice daily, rhythm controlled on amiodarone 200 mg once daily. -On dose reduced Eliquis 2.5 mg twice daily due to his age >3, and creatinine >1.5.  CHA2DS2-VASc 6 (age, CHF, HTN, CAD, DM2)   # CAD s/p CABG x 2 (1991) # Demand ischemia Currently denies chest pain, troponins on admission barely elevated at 22, 23, which is most consistent with demand/supply mismatch and not ACS.   This patient's plan of care was discussed and created with Dr. Clayborn Bigness and he is in agreement.  Signed: Tristan Schroeder , PA-C 06/02/2022, 9:02  AM [facility]

## 2022-06-02 NOTE — Care Management Important Message (Signed)
Important Message  Patient Details  Name: Randall Vincent MRN: [id number] Date of Birth: July 25, 1940   Medicare Important Message Given:  Yes     Dannette Barbara 06/02/2022, 1:42 PM

## 2022-06-03 DIAGNOSIS — I5023 Acute on chronic systolic (congestive) heart failure: Secondary | ICD-10-CM | POA: Diagnosis not present

## 2022-06-03 LAB — BASIC METABOLIC PANEL
Anion gap: 8 (ref 5–15)
BUN: 47 mg/dL — ABNORMAL HIGH (ref 8–23)
CO2: 33 mmol/L — ABNORMAL HIGH (ref 22–32)
Calcium: 7.7 mg/dL — ABNORMAL LOW (ref 8.9–10.3)
Chloride: 97 mmol/L — ABNORMAL LOW (ref 98–111)
Creatinine, Ser: 1.9 mg/dL — ABNORMAL HIGH (ref 0.61–1.24)
GFR, Estimated: 35 mL/min — ABNORMAL LOW (ref >60)
Glucose, Bld: 89 mg/dL (ref 70–99)
Potassium: 3.6 mmol/L (ref 3.5–5.1)
Sodium: 138 mmol/L (ref 135–145)

## 2022-06-03 LAB — CBC
HCT: 26.3 % — ABNORMAL LOW (ref 39.0–52.0)
Hemoglobin: 7.9 g/dL — ABNORMAL LOW (ref 13.0–17.0)
MCH: 27.1 pg (ref 26.0–34.0)
MCHC: 30 g/dL (ref 30.0–36.0)
MCV: 90.4 fL (ref 80.0–100.0)
Platelets: 93 10*3/uL — ABNORMAL LOW (ref 150–400)
RBC: 2.91 MIL/uL — ABNORMAL LOW (ref 4.22–5.81)
RDW: 18.2 % — ABNORMAL HIGH (ref 11.5–15.5)
WBC: 3.1 10*3/uL — ABNORMAL LOW (ref 4.0–10.5)
nRBC: 0 % (ref 0.0–0.2)

## 2022-06-03 LAB — GLUCOSE, CAPILLARY
Glucose-Capillary: 86 mg/dL (ref 70–99)
Glucose-Capillary: 111 mg/dL — ABNORMAL HIGH (ref 70–99)
Glucose-Capillary: 65 mg/dL — ABNORMAL LOW (ref 70–99)
Glucose-Capillary: 72 mg/dL (ref 70–99)
Glucose-Capillary: 122 mg/dL — ABNORMAL HIGH (ref 70–99)

## 2022-06-03 LAB — MAGNESIUM: Magnesium: 2.3 mg/dL (ref 1.7–2.4)

## 2022-06-03 LAB — PHOSPHORUS: Phosphorus: 4 mg/dL (ref 2.5–4.6)

## 2022-06-03 MED ORDER — METOLAZONE 2.5 MG PO TABS
2.5000 mg | ORAL_TABLET | Freq: Once | ORAL | Status: AC
  Administered 2022-06-03: 2.5 mg via ORAL
  Filled 2022-06-03: qty 1

## 2022-06-03 MED ORDER — POTASSIUM CHLORIDE CRYS ER 20 MEQ PO TBCR
40.0000 meq | EXTENDED_RELEASE_TABLET | Freq: Once | ORAL | Status: AC
  Administered 2022-06-03: 40 meq via ORAL
  Filled 2022-06-03: qty 2

## 2022-06-03 MED ORDER — APIXABAN 5 MG PO TABS: 5.0000 mg | ORAL_TABLET | Freq: Two times a day (BID) | ORAL | Status: DC

## 2022-06-03 MED ORDER — APIXABAN 2.5 MG PO TABS
2.5000 mg | ORAL_TABLET | Freq: Two times a day (BID) | ORAL | Status: DC
  Administered 2022-06-03 – 2022-06-09 (×12): 2.5 mg via ORAL
  Filled 2022-06-03 (×13): qty 1

## 2022-06-03 MED ORDER — PANTOPRAZOLE SODIUM 40 MG IV SOLR
40.0000 mg | Freq: Two times a day (BID) | INTRAVENOUS | Status: DC
  Administered 2022-06-03 – 2022-06-06 (×7): 40 mg via INTRAVENOUS
  Filled 2022-06-03 (×8): qty 10

## 2022-06-03 MED ORDER — SODIUM CHLORIDE 0.9 % IV SOLN
300.0000 mg | Freq: Every day | INTRAVENOUS | Status: AC
  Administered 2022-06-03 – 2022-06-05 (×3): 300 mg via INTRAVENOUS
  Filled 2022-06-03 (×3): qty 300

## 2022-06-03 NOTE — Progress Notes (Signed)
[place] NOTE       Patient ID: Randall Vincent MRN: [id number] DOB/AGE: May 18, 1941 82 y.o.  Admit date: 05/29/2022 Referring Physician Dr. Val Riles Primary Physician Grier Rocher, PA-C ([place])  Primary Cardiologist Dr. Clayborn Bigness Reason for Consultation AoCHF  HPI: Randall Vincent is a 43yoM with a PMH of CAD s/p CABG x2 1991 and BMS, paroxysmal AF s/p DCCV 2021 on Eliquis, HFpEF (LVEF 45-50%, mi-mod MR, mod TR 08/2021), HTN, DM2, CKD3, who presented to [facility] ED 05/29/22 with worsening shortness of breath and peripheral edema extending up to his thighs and abdomen. Cardiology is consulted on hospital day 2 for further assistance with his HF. Echo this admission revealed a worsening EF of 30-35%, g3DD, and mod reduced LV function, severely reduced RV function.  He continues to diurese well on a lasix infusion  Interval History:  -Hgb drifting down, Hemoccult positive.  GI consulted by primary team for this -The patient says he feels better, "less weak" today.  Was up in chair for a while yesterday. -Peripheral and abdominal edema improving.  No chest pain or shortness of breath -Discussed his cardiac meds in detail again this morning  Net IO Since Admission: -5,211.04 mL [06/03/22 0934]   Review of systems complete and found to be negative unless listed above     Past Medical History:  Diagnosis Date   A-fib ([place])    a.) CHA2DS2-VASc = 6 (age x 2, CHF, HTN, previous MI, T2DM). b.) Rate/rhythm maintained on amiodarone + diltiazem + carvedilol; chronically anticoagulated using dabigatran. c.) DCCV 07/10/2015, 08/08/2019, 10/22/2019.   Arthritis    Chest pain 06/26/2019   CHF (congestive heart failure) (HCC)    CKD (chronic kidney disease), stage III ([place])    Coronary artery disease 1991   a.) 2v CABG in 1991. b.) PCI 06/07/2006 --> Vision stents to native LCx and RCA   Hyperlipidemia    Hypertension    Long term current use of anticoagulant    a.)  dabigatran   Myocardial infarction ([place]) 1991   a.) LHC --> 50% and 90% LAD lesions; referred to CVTS. 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   a.) LIMA-LAD, SVG-RCA   T2DM (type 2 diabetes mellitus) ([place])     Past Surgical History:  Procedure Laterality Date   BACK SURGERY  1970   L5-L6 ruptured disk   BLADDER SURGERY  2000   CARDIOVERSION N/A 07/10/2015   CARDIOVERSION N/A 08/08/2019   CARDIOVERSION N/A 10/22/2019   COLON SURGERY     COLONOSCOPY N/A 01/15/2020   Procedure: COLONOSCOPY;  Surgeon: Virgel Manifold, MD;  Location: [facility] ENDOSCOPY;  Service: Endoscopy;  Laterality: N/A;   COLONOSCOPY N/A 01/16/2020   Procedure: COLONOSCOPY;  Surgeon: Virgel Manifold, MD;  Location: [facility] ENDOSCOPY;  Service: Endoscopy;  Laterality: N/A;   CORONARY ARTERY BYPASS GRAFT N/A 01/02/1990   Procedure: 2v CORONARY ARTERY BYPASS GRAFT (LIMA-LAD, SVG-RCA)   ESOPHAGOGASTRODUODENOSCOPY N/A 01/13/2020   Procedure: ESOPHAGOGASTRODUODENOSCOPY (EGD);  Surgeon: Virgel Manifold, MD;  Location: [facility] ENDOSCOPY;  Service: Endoscopy;  Laterality: N/A;   REPLACEMENT TOTAL KNEE BILATERAL Bilateral 2008   STENT PLACEMENT VASCULAR ([place] HX)  2008   TOTAL KNEE REVISION Left 01/16/2020   Procedure: TOTAL KNEE REVISION;  Surgeon: Lovell Sheehan, MD;  Location: [facility] ORS;  Service: Orthopedics;  Laterality: Left;   TOTAL KNEE REVISION Left 05/31/2021   Procedure: TOTAL KNEE REVISION;  Surgeon: Lyndle Herrlich, MD;  Location: [facility] ORS;  Service: Orthopedics;  Laterality: Left;    Medications Prior to Admission  Medication Sig Dispense Refill Last Dose   amiodarone (PACERONE) 200 MG tablet Take 200 mg by mouth daily.   05/29/2022   apixaban (ELIQUIS) 2.5 MG TABS tablet Take 2.5 mg by mouth 2 (two) times daily.   05/29/2022   atorvastatin (LIPITOR) 40 MG tablet Take 40 mg by mouth every evening.    05/28/2022   carvedilol (COREG) 12.5 MG tablet Take 12.5  mg by mouth 2 (two) times daily.   05/29/2022   PROAIR RESPICLICK 108 (90 Base) MCG/ACT AEPB Inhale into the lungs.   prn   ramipril (ALTACE) 10 MG capsule Take 1 capsule (10 mg total) by mouth daily. 30 capsule 1 05/29/2022   Semaglutide, 1 MG/DOSE, 2 MG/1.5ML SOPN Inject 1 mg into the skin every Thursday.   Past Month   torsemide (DEMADEX) 20 MG tablet Take 1 tablet (20 mg total) by mouth 2 (two) times daily. (Patient taking differently: Take 10-20 mg by mouth See admin instructions. Take 20 mg by mouth in the morning and 10 mg in the evening) 60 tablet 1 05/29/2022   vitamin B-12 (CYANOCOBALAMIN) 1000 MCG tablet Take 1,000 mcg by mouth daily.   05/29/2022   apixaban (ELIQUIS) 5 MG TABS tablet Take 1 tablet (5 mg total) by mouth 2 (two) times daily. (Patient not taking: Reported on 05/29/2022) 60 tablet 3 Not Taking   carvedilol (COREG) 3.125 MG tablet Take 1 tablet (3.125 mg total) by mouth 2 (two) times daily with a meal. (Patient not taking: Reported on 05/29/2022) 60 tablet 1 Not Taking   colchicine 0.6 MG tablet Take 0.6 mg by mouth 2 (two) times daily. (Patient not taking: Reported on 05/05/2022)      diltiazem (CARDIZEM CD) 240 MG 24 hr capsule Take 240 mg by mouth daily. (Patient not taking: Reported on 05/29/2022)   Not Taking   metFORMIN (GLUCOPHAGE-XR) 500 MG 24 hr tablet Take 500 mg by mouth 2 (two) times daily. (Patient not taking: Reported on 05/05/2022)       Social History   Socioeconomic History   Marital status: Widowed    Spouse name: Not on file   Number of children: Not on file   Years of education: Not on file   Highest education level: Not on file  Occupational History   Not on file  Tobacco Use   Smoking status: Former    Types: Cigarettes    Quit date: 2    Years since quitting: 33.0   Smokeless tobacco: Never  Vaping Use   Vaping Use: Never used  Substance and Sexual Activity   Alcohol use: Not Currently    Alcohol/week: 1.0 standard drink of alcohol    Types: 1  Cans of beer per week    Comment: very rarely   Drug use: Never   Sexual activity: Not on file  Other Topics Concern   Not on file  Social History Narrative   Not on file   Social Determinants of Health   Financial Resource Strain: Not on file  Food Insecurity: No Food Insecurity (05/30/2022)   Hunger Vital Sign    Worried About Running Out of Food in the Last Year: Never true    Ran Out of Food in the Last Year: Never true  Transportation Needs: No Transportation Needs (05/30/2022)   PRAPARE - Administrator, Civil Service (Medical): No  Lack of Transportation (Non-Medical): No  Physical Activity: Not on file  Stress: Not on file  Social Connections: Not on file  Intimate Partner Violence: Not At Risk (05/30/2022)   Humiliation, Afraid, Rape, and Kick questionnaire    Fear of Current or Ex-Partner: No    Emotionally Abused: No    Physically Abused: No    Sexually Abused: No    History reviewed. No pertinent family history.    Intake/Output Summary (Last 24 hours) at 06/03/2022 0934 Last data filed at 06/03/2022 0855 Gross per 24 hour  Intake 273.62 ml  Output 1250 ml  Net -976.38 ml     Vitals:   06/02/22 2008 06/02/22 2354 06/03/22 0409 06/03/22 0747  BP: (!) 106/50 (!) 96/52 (!) 102/48 115/63  Pulse: 77 78 74 79  Resp: 16 16 16 18   Temp: 98.7 F (37.1 C) 98.1 F (36.7 C) 98.3 F (36.8 C) 97.6 F (36.4 C)  TempSrc: Oral     SpO2: 98% 90% 98% 97%  Weight:      Height:        PHYSICAL EXAM General: elderly and ill appearing caucasian male , well nourished, in no acute distress. Sitting upright in hospital bed in PCU HEENT:  Normocephalic and atraumatic. Neck:  No JVD.  Lungs: slight conversational dyspnea on O2 by [place]. Decreased breath sounds with right basilar crackles Heart: HRRR . Normal S1 and S2 without gallops or murmurs.  Abdomen: mildly distended appearing with pitting edema on both lower flanks and along pannus, although  softer/improving Msk: Normal strength and tone for age. Extremities: chronic venous stasis hyperpigmentation with bandages on both shins. Trace edema bilaterally that is improving Neuro: Alert and oriented X 3. Psych:  Answers questions appropriately.   Labs: Basic Metabolic Panel: Recent Labs    06/02/22 0242 06/03/22 0430  NA 140 138  K 3.8 3.6  CL 101 97*  CO2 32 33*  GLUCOSE 100* 89  BUN 44* 47*  CREATININE 2.00* 1.90*  CALCIUM 7.6* 7.7*  MG 2.2 2.3  PHOS 3.4 4.0    Liver Function Tests: No results for input(s): "AST", "ALT", "ALKPHOS", "BILITOT", "PROT", "ALBUMIN" in the last 72 hours.  No results for input(s): "LIPASE", "AMYLASE" in the last 72 hours. CBC: Recent Labs    06/02/22 0242 06/02/22 1542 06/03/22 0430  WBC 3.5*  --  3.1*  HGB 7.6* 8.4* 7.9*  HCT 25.6* 28.1* 26.3*  MCV 90.8  --  90.4  PLT 109*  --  93*    Cardiac Enzymes: No results for input(s): "CKTOTAL", "CKMB", "CKMBINDEX", "TROPONINIHS" in the last 72 hours.  BNP: No results for input(s): "BNP" in the last 72 hours.  D-Dimer: No results for input(s): "DDIMER" in the last 72 hours. Hemoglobin A1C: No results for input(s): "HGBA1C" in the last 72 hours. Fasting Lipid Panel: No results for input(s): "CHOL", "HDL", "LDLCALC", "TRIG", "CHOLHDL", "LDLDIRECT" in the last 72 hours. Thyroid Function Tests: No results for input(s): "TSH", "T4TOTAL", "T3FREE", "THYROIDAB" in the last 72 hours.  Invalid input(s): "FREET3" Anemia Panel: Recent Labs    06/02/22 0902  VITAMINB12 571  FOLATE 9.6  TIBC 241*  IRON 16*     Radiology: ECHOCARDIOGRAM COMPLETE  Result Date: 05/30/2022    ECHOCARDIOGRAM REPORT   Patient Name:   Randall Vincent Date of Exam: 05/30/2022 Medical Rec #:  [id number]     Height:       68.0 in Accession #:    [id number]    Weight:  231.1 lb Date of Birth:  02/17/1941      BSA:          2.173 m Patient Age:    62 years      BP:           114/55 mmHg Patient Gender: M              HR:           53 bpm. Exam Location:  [facility] Procedure: 2D Echo, Cardiac Doppler and Color Doppler Indications:    CHF- Acute Systolic H60.73  History:        Patient has prior history of Echocardiogram examinations, most                 recent 09/18/2021. CHF, CAD and Previous Myocardial Infarction,                 Prior CABG, Arrythmias:Atrial Fibrillation, Signs/Symptoms:Chest                 Pain; Risk Factors:Dyslipidemia, Hypertension, Sleep Apnea and                 Diabetes. CKD, stage III.  Sonographer:    Ronny Flurry Referring Phys: [id number] IULIA BASARABA IMPRESSIONS  1. Left ventricular ejection fraction, by estimation, is 30 to 35%. The left ventricle has moderately decreased function. Left ventricular endocardial border not optimally defined to evaluate regional wall motion. The left ventricular internal cavity size was mildly dilated. There is moderate left ventricular hypertrophy. Left ventricular diastolic parameters are consistent with Grade III diastolic dysfunction (restrictive).  2. Right ventricular systolic function is severely reduced. The right ventricular size is mildly enlarged. There is moderately elevated pulmonary artery systolic pressure.  3. Left atrial size was moderately dilated.  4. Right atrial size was moderately dilated.  5. The mitral valve is abnormal. Moderate mitral valve regurgitation. No evidence of mitral stenosis. Moderate mitral annular calcification.  6. Tricuspid valve regurgitation is moderate to severe.  7. The aortic valve is normal in structure. Aortic valve regurgitation is not visualized. Aortic valve sclerosis is present, with no evidence of aortic valve stenosis.  8. Moderately dilated pulmonary artery.  9. The inferior vena cava is dilated in size with <50% respiratory variability, suggesting right atrial pressure of 15 mmHg. FINDINGS  Left Ventricle: Left ventricular ejection fraction, by estimation, is 30 to 35%. The left ventricle has moderately  decreased function. Left ventricular endocardial border not optimally defined to evaluate regional wall motion. The left ventricular internal cavity size was mildly dilated. There is moderate left ventricular hypertrophy. Abnormal (paradoxical) septal motion consistent with post-operative status. Left ventricular diastolic parameters are consistent with Grade III diastolic dysfunction  (restrictive). Right Ventricle: The right ventricular size is mildly enlarged. No increase in right ventricular wall thickness. Right ventricular systolic function is severely reduced. There is moderately elevated pulmonary artery systolic pressure. The tricuspid regurgitant velocity is 2.92 m/s, and with an assumed right atrial pressure of 15 mmHg, the estimated right ventricular systolic pressure is 48.5 mmHg. Left Atrium: Left atrial size was moderately dilated. Right Atrium: Right atrial size was moderately dilated. Pericardium: There is no evidence of pericardial effusion. Mitral Valve: The mitral valve is abnormal. There is mild thickening of the mitral valve leaflet(s). Moderate mitral annular calcification. Moderate mitral valve regurgitation. No evidence of mitral valve stenosis. Tricuspid Valve: The tricuspid valve is normal in structure. Tricuspid valve regurgitation is moderate to severe. No evidence of tricuspid stenosis. Aortic  Valve: The aortic valve is normal in structure. Aortic valve regurgitation is not visualized. Aortic valve sclerosis is present, with no evidence of aortic valve stenosis. Aortic valve mean gradient measures 3.5 mmHg. Aortic valve peak gradient measures 6.5 mmHg. Aortic valve area, by VTI measures 3.06 cm. Pulmonic Valve: The pulmonic valve was normal in structure. Pulmonic valve regurgitation is mild to moderate. No evidence of pulmonic stenosis. Aorta: The aortic root is normal in size and structure. Pulmonary Artery: The pulmonary artery is moderately dilated. Venous: The inferior vena cava is  dilated in size with less than 50% respiratory variability, suggesting right atrial pressure of 15 mmHg. IAS/Shunts: No atrial level shunt detected by color flow Doppler.  LEFT VENTRICLE PLAX 2D LVIDd:         5.50 cm   Diastology LVIDs:         4.60 cm   LV e' medial:    5.43 cm/s LV PW:         1.40 cm   LV E/e' medial:  22.1 LV IVS:        1.40 cm   LV e' lateral:   6.97 cm/s LVOT diam:     2.20 cm   LV E/e' lateral: 17.2 LV SV:         86 LV SV Index:   40 LVOT Area:     3.80 cm  RIGHT VENTRICLE            IVC RV S prime:     3.88 cm/s  IVC diam: 2.50 cm TAPSE (M-mode): 1.0 cm LEFT ATRIUM           Index        RIGHT ATRIUM           Index LA diam:      4.80 cm 2.21 cm/m   RA Area:     27.70 cm LA Vol (A2C): 55.9 ml 25.72 ml/m  RA Volume:   99.70 ml  45.88 ml/m LA Vol (A4C): 71.9 ml 33.08 ml/m  AORTIC VALVE                    PULMONIC VALVE AV Area (Vmax):    3.04 cm     PR End Diast Vel: 10.24 msec AV Area (Vmean):   3.04 cm AV Area (VTI):     3.06 cm AV Vmax:           127.50 cm/s AV Vmean:          84.500 cm/s AV VTI:            0.281 m AV Peak Grad:      6.5 mmHg AV Mean Grad:      3.5 mmHg LVOT Vmax:         102.00 cm/s LVOT Vmean:        67.600 cm/s LVOT VTI:          0.226 m LVOT/AV VTI ratio: 0.80  AORTA Ao Root diam: 3.30 cm Ao Asc diam:  3.50 cm MITRAL VALVE                TRICUSPID VALVE MV Area (PHT): 4.06 cm     TR Peak grad:   34.1 mmHg MV Decel Time: 187 msec     TR Vmax:        292.00 cm/s MR Peak grad: 66.3 mmHg MR Vmax:      407.00 cm/s   SHUNTS MV E velocity: 120.00 cm/s  Systemic VTI:  0.23 m MV A velocity: 47.40 cm/s   Systemic Diam: 2.20 cm MV E/A ratio:  2.53 Lorine Bears MD Electronically signed by Lorine Bears MD Signature Date/Time: 05/30/2022/4:25:38 PM    Final    US ARTERIAL ABI (SCREENING LOWER EXTREMITY)  Result Date: 05/30/2022 CLINICAL DATA:  82 year old male with a history of bilateral lower extremity edema EXAM: NONINVASIVE PHYSIOLOGIC VASCULAR STUDY OF BILATERAL  LOWER EXTREMITIES TECHNIQUE: Evaluation of both lower extremities was performed at rest, including calculation of ankle-brachial indices, multiple segmental pressure evaluation, segmental Doppler and segmental pulse volume recording. COMPARISON:  None Available. FINDINGS: Right ABI:  1.47 Left ABI:  1.55 Right Lower Extremity: Segmental Doppler at the right ankle demonstrates monophasic waveforms Left Lower Extremity: Segmental Doppler at the left ankle demonstrates triphasic posterior tibial and dorsalis pedis waveform. IMPRESSION: Right: Resting ABI is likely falsely elevated secondary to noncompressible vessels, given the segmental Doppler. Segmental Doppler demonstrates monophasic waveforms at the right ankle, indicating more proximal occlusive disease. Left: Resting ABI likely falsely elevated secondary to noncompressible vessels. Segmental Doppler at the left ankle demonstrates waveforms relatively maintained. Signed, Yvone Neu. Miachel Roux, RPVI [facility] Radiology Electronically Signed   By: Gilmer Mor D.O.   On: 05/30/2022 10:56   DG Chest 2 View  Result Date: 05/29/2022 CLINICAL DATA:  Shortness of breath and ascites.  History of CHF. EXAM: CHEST - 2 VIEW COMPARISON:  09/16/2021 FINDINGS: Cardiac enlargement. Aortic atherosclerosis. Previous median sternotomy and CABG procedure. Bilateral pleural effusions, right greater than left. Mild interstitial edema. Atelectasis noted in both lung bases. No airspace opacities. IMPRESSION: 1. Mild congestive heart failure. 2. Bibasilar atelectasis. Electronically Signed   By: Signa Kell M.D.   On: 05/29/2022 10:27    ECHO 05/30/2022 1. Left ventricular ejection fraction, by estimation, is 30 to 35%. The  left ventricle has moderately decreased function. Left ventricular  endocardial border not optimally defined to evaluate regional wall motion.  The left ventricular internal cavity  size was mildly  dilated. There is moderate left ventricular hypertrophy.  Left ventricular diastolic parameters are consistent with Grade III  diastolic dysfunction (restrictive).   2. Right ventricular systolic function is severely reduced. The right  ventricular size is mildly enlarged. There is moderately elevated  pulmonary artery systolic pressure.   3. Left atrial size was moderately dilated.   4. Right atrial size was moderately dilated.   5. The mitral valve is abnormal. Moderate mitral valve regurgitation. No  evidence of mitral stenosis. Moderate mitral annular calcification.   6. Tricuspid valve regurgitation is moderate to severe.   7. The aortic valve is normal in structure. Aortic valve regurgitation is  not visualized. Aortic valve sclerosis is present, with no evidence of  aortic valve stenosis.   8. Moderately dilated pulmonary artery.   9. The inferior vena cava is dilated in size with <50% respiratory  variability, suggesting right atrial pressure of 15 mmHg.   TELEMETRY reviewed by me (LT) 06/03/2022 : NSR rate 70s  EKG reviewed by me: NSR bifacicular block   Data reviewed by me (LT) 06/03/2022: hospitalist progress note, cbc, bmp, cxr, vitals, tele, I/o   Principal Problem:   Acute on chronic heart failure [facility]) Active Problems:   Atrial fibrillation status post cardioversion 10/22/19 [facility])   Essential hypertension   DM (diabetes mellitus), type 2 (HCC)   Stage 3b chronic kidney disease (CKD) (HCC)   Venous stasis dermatitis of both lower extremities  ASSESSMENT AND PLAN:  Randall Vincent is a 8yoM with a PMH of CAD s/p CABG x2 1991 and BMS, paroxysmal AF s/p DCCV 2021 on Eliquis, HFpEF (LVEF 45-50%, mi-mod MR, mod TR 08/2021), HTN, DM2, CKD3, who presented to [facility] ED 05/29/22 with worsening shortness of breath and peripheral edema extending up to his thighs and abdomen. Cardiology is consulted on hospital day 2 for further assistance with his HF. Echo this admission revealed a  worsening EF of 30-35%, g3DD, and mod reduced LV function, severely reduced RV function  # Acute on chronic HFrEF (30-35%, G3 DD 05/2022) Presents with several days of worsening shortness of breath, abdominal distention, and peripheral edema, refractory to an extra dose of torsemide 20 mg the day prior to admission.  Admits to eating primarily frozen dinners and canned soup since his family who he usually eats with has been sick and they have not been eating together for the past 2 to 3 weeks.  BNP is elevated to 1349 on admission and is clinically hypervolemic with pitting edema from his feet up to his lower abdomen/flanks.  Clinical improvement after IV Lasix, now on a Lasix infusion with  Net IO Since Admission: -5,211.04 mL [06/03/22 0934]  -S/p IV Lasix 60 mg x 1, 40 mg x 2 -continue Lasix infusion 8 mg/h (Home diuretic torsemide 20 mg twice daily) -s/p metolazone 2.5mg  PO x1 1/10, will give another  dose of 2.5mg  PO x 1 today -Continue GDMT with Coreg 3.125 mg twice daily -continue spironolactone 12.5 mg once daily -Hold ramipril 10 mg with diuresis.  Consider change to ARNI, SGLT2i pending clinical course as BP and renal function allows -Strict I's/O, discussed salt and fluid restriction at length -anticipate 2-3 more days of diuresis  -encouraged mobility + participation with PT  # AKI on CKD 3 Cr trend with diuresis 2.18-2.37-2.26--2.15--2.00-- 1.90, current GFR 35, CR ranged from the past 6 months from 1.8-2.44 per review of his outpatient labs.  Monitor closely with diuresis  # Paroxysmal AF s/p DCCV 2021 Currently in sinus rhythm on telemetry, rate controlled on carvedilol 3.125 mg twice daily, rhythm controlled on amiodarone 200 mg once daily. -On dose reduced Eliquis 2.5 mg twice daily due to his age >55, and creatinine >1.5 -- although this was held by primary after AM dose 1/12 -  CHA2DS2-VASc 6 (age, CHF, HTN, CAD, DM2)   # CAD s/p CABG x 2 (1991) # Demand ischemia Currently  denies chest pain, troponins on admission barely elevated at 22, 23, which is most consistent with demand/supply mismatch and not ACS.   This patient's plan of care was discussed and created with Dr. Juliann Pares and he is in agreement.  Signed: Rebeca Allegra , PA-C 06/03/2022, 9:34 AM [facility] Cardiology

## 2022-06-03 NOTE — Progress Notes (Deleted)
Pts bladder scan this morning was 919mL. Pt refusing to stand to try and pee and pt refusing in and out cath at this time. This RN explained the procedure to the pt and the risks involved with having that much urine in the bladder. Pt expressed understanding and ask if he could be given more time to try and pee on his own. Will pass this information off to day shift RN. Pt has no c/o pain or discomfort.

## 2022-06-03 NOTE — Progress Notes (Signed)
Mobility Specialist - Progress Note  Post-mobility: SPO2(100)     06/03/22 1425  Mobility  Activity Ambulated with assistance in hallway;Stood at bedside  Level of Assistance Standby assist, set-up cues, supervision of patient - no hands on  Assistive Device Front wheel walker  Distance Ambulated (ft) 180 ft  Activity Response Tolerated well  Mobility Referral Yes  $Mobility charge 1 Mobility   Pt resting in bed on 3L upon entry/ Pt STS and ambulates in hallway around NS with RW SBA. Pt took x2 standing rest breaks leaning on NS; pt declined sitting. Pt returned to bed and left with needs in reach and bed alarm activated.   Loma Sender Mobility Specialist 06/03/22, 2:29 PM

## 2022-06-03 NOTE — Progress Notes (Addendum)
[facility] Progress Note  Patient: Randall Vincent    GNF:[id number]  DOA: 05/29/2022     Date of Service: the patient was seen and examined on 06/03/2022  Chief Complaint  Patient presents with   Shortness of Breath   Brief hospital course: Randall Vincent is a 82 y.o. 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 ED w/up: Chest x-ray was obtained that demonstrated interstitial edema, mild, with bilateral pleural effusions. Due to failure of outpatient treatment, [facility] contacted for admission for acute on chronic heart failure.    Assessment and Plan:  # Acute on chronic systolic heart failure  Patient has a history of moderately reduced EF with last EF of 45-50%, follows with Dr. Juliann Pares.  Despite increasing doses of home torsemide, patient presents with marked pitting edema extending up to the mid abdomen. - S/p Lasix 60 mg IV once and  Lasix 40 mg IV BID On 1/8 started Lasix IV infusion due to anasarca - Strict in and out - Daily weights - Continue home GDMT TTE shows LVEF 30 to 35%, grade 3 diastolic dysfunction, right ventricular failure, moderate pulm hypertension, moderate MR and moderate to severe TR Cardiology consulted for further recommendation. 1/10 metolazone 2.5 mg p.o. x 1 dose ordered by cardiology 1/11 started spironolactone 12.5 mg p.o. daily as per cardiology   Anemia of chronic disease, iron deficiency and occult GI bleeding, unknown source FOBT positive but no overt GI bleeding Discussed with GI, patient is not stable for EGD at this time and no overt GI bleeding, patient refusing colonoscopy.  GI recommended to continue Eliquis for now given benefit overweigh the risk of bleeding Monitor H&H and transfuse if hemoglobin  less than 8   Iron deficiency, iron level 16, transferrin saturation 7% so started Venofer 300 mg IV daily x 3 doses, start oral iron supplement on discharge.  B12 and folate within normal range.  Stage 3b chronic kidney disease (CKD) Patient's renal function frequently fluctuates between 1.6 and 2.2.   Currently 2.18, previously 1.64.  I wonder if there is a component of cardiorenal syndrome.  Will monitor closely while diuresing. - Daily BMP - Monitor urine output   Atrial fibrillation status post cardioversion 10/22/19  Currently rate controlled. Continue home amiodarone 200 POD, Eliquis 2.5 twice daily, and Coreg 3.125 twice daily   Venous stasis dermatitis of both lower extremities Per Dr. Glennis Brink most recent note, plans were to obtain ABIs.  Will do so at this time and if no evidence of PAD, plan to order Unna boots ABI:  Right ABI:  1.47 and Left ABI:  1.55 Right Lower Extremity: Segmental Doppler at the right ankle demonstrates monophasic waveforms Left Lower Extremity: Segmental Doppler at the left ankle demonstrates triphasic posterior tibial and dorsalis pedis waveform. - pt may need Unna boots, will reassess after diuresis   Essential hypertension - Continue home antihypertensives   DM (diabetes mellitus), type 2 (HCC) Well-managed with semaglutide with last A1c of 5.9% 4 weeks ago. - Hold home semaglutide while admitted - SSI, moderate   Vitamin D deficiency: started vitamin D 50,000 units p.o. weekly, follow with PCP to repeat vitamin D level after 3 to 6 months.  Body mass index is 35.14 kg/m.  Interventions:     Diet:  Heart healthy diet, fluid striction 1.5 L/day DVT Prophylaxis: Therapeutic Anticoagulation with Eliquis    Advance goals of care discussion: DNR  Family Communication: family was not present at bedside, at the time of interview.  The pt provided permission to discuss medical plan with the family. Opportunity was given to ask question and all  questions were answered satisfactorily.   Disposition:  Pt is from Home, admitted with volume overload on IV Lasix infusion, still has anasarca, which precludes a safe discharge. Discharge to home, when clinically stable, may need 2-3 days for 2 diuresis.    Subjective: No significant overnight events, patient feels improvement in the bilateral lower extremity edema, still has significant abdominal wall edema.  Breathing is getting better, denies any active issues. Patient denies any GI bleeding, stool is brown.  Patient is aware about FOBT positive, stated that he was worked up in the past and they could not find any source of bleeding.  But I informed him that GI has been consulted.  Physical Exam: General: NAD, lying comfortably, mild SOB Appear in no distress, affect appropriate Eyes: Right eye PERRLA, (left eye blind since childhood) ENT: Oral Mucosa Clear, moist  Neck: no JVD,  Cardiovascular: S1 and S2 Present, no Murmur,  Respiratory: good respiratory effort, Bilateral Air entry equal and Decreased, mild bilateral crackles, no wheezes Abdomen: Bowel Sound present, Soft and no tenderness, significant edematous abdominal wall Skin: no rashes Extremities: 3 + Pedal edema, no calf tenderness, chronic venous stasis pigmentation Neurologic: without any new focal findings Gait not checked due to patient safety concerns  Vitals:   06/03/22 0747 06/03/22 1000 06/03/22 1131 06/03/22 1620  BP: 115/63  (!) 109/51 (!) 98/58  Pulse: 79  71 71  Resp: 18  18 17   Temp: 97.6 F (36.4 C)  97.9 F (36.6 C) 97.7 F (36.5 C)  TempSrc:   Oral   SpO2: 97%  100% 100%  Weight:  96.2 kg    Height:        Intake/Output Summary (Last 24 hours) at 06/03/2022 1627 Last data filed at 06/03/2022 1528 Gross per 24 hour  Intake 1419.46 ml  Output 1675 ml  Net -255.54 ml   Filed Weights   05/31/22 2005 06/01/22 0503 06/03/22 1000  Weight: 103.1 kg 102.4 kg 96.2 kg    Data Reviewed: I have  personally reviewed and interpreted daily labs, tele strips, imagings as discussed above. I reviewed all nursing notes, pharmacy notes, vitals, pertinent old records I have discussed plan of care as described above with RN and patient/family.  CBC: Recent Labs  Lab 05/30/22 0225 05/31/22 0500 06/01/22 0451 06/02/22 0242 06/02/22 1542 06/03/22 0430  WBC 4.8 6.0 5.9 3.5*  --  3.1*  NEUTROABS 3.6  --   --   --   --   --   HGB 7.9* 8.8* 8.5* 7.6* 8.4* 7.9*  HCT 26.8* 30.4* 29.1* 25.6* 28.1* 26.3*  MCV 91.2 92.7 91.8 90.8  --  90.4  PLT 150 164 164 109*  --  93*   Basic Metabolic Panel: Recent Labs  Lab 05/30/22 0225 05/31/22 0500 06/01/22 0451 06/02/22 0242 06/03/22 0430  NA 140 138 140 140 138  K 4.0 4.5 3.9 3.8 3.6  CL 105 103 103 101 97*  CO2 29 28 32 32 33*  GLUCOSE 101* 97 103* 100* 89  BUN 48* 46* 45* 44* 47*  CREATININE 2.37* 2.26* 2.15* 2.00* 1.90*  CALCIUM 7.7* 7.8* 8.0* 7.6* 7.7*  MG 2.2  2.4 2.4 2.2 2.3  PHOS  --  4.6 4.1 3.4 4.0    Studies: No results found.  Scheduled Meds:  amiodarone  200 mg Oral Daily   atorvastatin  40 mg Oral QPM   bisacodyl  5 mg Oral QHS   carvedilol  3.125 mg Oral BID   insulin aspart  0-15 Units Subcutaneous TID WC   pantoprazole (PROTONIX) IV  40 mg Intravenous Q12H   polyethylene glycol  17 g Oral Daily   spironolactone  12.5 mg Oral Daily   Vitamin D (Ergocalciferol)  50,000 Units Oral Q7 days   Continuous Infusions:  furosemide (LASIX) 200 mg in dextrose 5 % 100 mL (2 mg/mL) infusion 8 mg/hr (06/02/22 1600)   iron sucrose 300 mg (06/03/22 1221)   PRN Meds: acetaminophen, benzonatate, ondansetron (ZOFRAN) IV  Time spent: 50 minutes  Author: Gillis Santa. MD [facility] 06/03/2022 4:27 PM  To reach On-call, see care teams to locate the attending and reach out to them via [email or web address]. If 7PM-7AM, please contact night-coverage If you still have difficulty reaching the attending provider, please page the [facility]  (Director on Call) for [facility] on amion for assistance.

## 2022-06-03 NOTE — Progress Notes (Signed)
Physical Therapy Treatment Patient Details Name: Randall Vincent MRN: [id number] DOB: 26-Mar-1941 Today's Date: 06/03/2022   History of Present Illness Patient is an 82 year old male who presents to ED for SOB. Patient has had gradually worsening LE swelling that reached to his abdomen. 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    PT Comments    Pt seen for PT tx with pt agreeable to tx. Pt received on [facility] & requires encouragement to attempt peri hygiene on his own. Pt then progresses to walking 1 lap around nurses station with RW & supervision but 4 standing rest breaks 2/2 fatigue. Pt then declines further mobility on this date. Will continue to follow pt acutely to address balance, endurance, and gait & stairs.     Recommendations for follow up therapy are one component of a multi-disciplinary discharge planning process, led by the attending physician.  Recommendations may be updated based on patient status, additional functional criteria and insurance authorization.  Follow Up Recommendations  Outpatient PT     Assistance Recommended at Discharge Set up Supervision/Assistance  Patient can return home with the following Assistance with cooking/housework;A little help with bathing/dressing/bathroom;Assist for transportation;Help with stairs or ramp for entrance   Equipment Recommendations       Recommendations for Other Services       Precautions / Restrictions Precautions Precautions: Fall Restrictions Weight Bearing Restrictions: No     Mobility  Bed Mobility               General bed mobility comments: not tested, pt received on BSC & left sitting in recliner    Transfers Overall transfer level: Needs assistance Equipment used: Rolling walker (2 wheels) Transfers: Sit to/from Stand Sit to Stand: Supervision           General transfer comment: Pt is able to transfer STS from [facility] without assistance,  requires elevated EOB to transfer STS from EOB with RW.    Ambulation/Gait Ambulation/Gait assistance: Supervision Gait Distance (Feet): 180 Feet Assistive device: Rolling walker (2 wheels) Gait Pattern/deviations: Decreased step length - right, Decreased step length - left, Decreased stride length Gait velocity: decreased     General Gait Details: PT provides ongoing cuing to ambulate within base of AD with upright posture with fair<>poor return demo. Pt requires 4 standing rest breaks during gait 2/2 fatigue.   Stairs             Wheelchair Mobility    Modified Rankin (Stroke Patients Only)       Balance Overall balance assessment: Needs assistance Sitting-balance support: No upper extremity supported, Feet supported Sitting balance-Leahy Scale: Good     Standing balance support: Single extremity supported, During functional activity, Bilateral upper extremity supported Standing balance-Leahy Scale: Fair Standing balance comment: Performs hand hygiene standing at sink with supervision.                            Cognition Arousal/Alertness: Awake/alert Behavior During Therapy: WFL for tasks assessed/performed Overall Cognitive Status: Within Functional Limits for tasks assessed                                          Exercises      General Comments General comments (skin integrity, edema, etc.): Pt  on 3L/min via nasal cannula throughout assistance. Pt with continent liquid BM upon PT arrival, requires max encouragement to attempt peri hygiene with pt able to complete. Pt also with small incontinent liquid BM in standing.      Pertinent Vitals/Pain Pain Assessment Pain Assessment: No/denies pain Faces Pain Scale: No hurt    Home Living                          Prior Function            PT Goals (current goals can now be found in the care plan section) Acute Rehab PT Goals Patient Stated Goal: to return  home PT Goal Formulation: With patient Time For Goal Achievement: 06/12/22 Potential to Achieve Goals: Good Progress towards PT goals: Progressing toward goals    Frequency    Min 2X/week      PT Plan Current plan remains appropriate    Co-evaluation              AM-PAC PT "6 Clicks" Mobility   Outcome Measure  Help needed turning from your back to your side while in a flat bed without using bedrails?: None Help needed moving from lying on your back to sitting on the side of a flat bed without using bedrails?: A Little Help needed moving to and from a bed to a chair (including a wheelchair)?: A Little Help needed standing up from a chair using your arms (e.g., wheelchair or bedside chair)?: A Little Help needed to walk in hospital room?: A Little Help needed climbing 3-5 steps with a railing? : A Little 6 Click Score: 19    End of Session Equipment Utilized During Treatment: Oxygen Activity Tolerance: Patient tolerated treatment well Patient left: in chair;with chair alarm set;with call bell/phone within reach Nurse Communication: Mobility status PT Visit Diagnosis: Unsteadiness on feet (R26.81);Other abnormalities of gait and mobility (R26.89);Repeated falls (R29.6);Muscle weakness (generalized) (M62.81);History of falling (Z91.81);Difficulty in walking, not elsewhere classified (R26.2)     Time: 2620-3559 PT Time Calculation (min) (ACUTE ONLY): 22 min  Charges:  $Therapeutic Activity: 8-22 mins                     Lavone Nian, PT, DPT 06/03/22, 12:44 PM    Waunita Schooner 06/03/2022, 12:43 PM

## 2022-06-03 NOTE — Progress Notes (Signed)
Occupational Therapy Treatment Patient Details Name: Randall Vincent MRN: [id number] DOB: Jun 04, 1940 Today's Date: 06/03/2022   History of present illness Patient is an 82 year old male who presents to ED for SOB. Patient has had gradually worsening LE swelling that reached to his abdomen. 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   OT comments  Pt. education was provided about energy conservation, work simplification techniques, and PLB techniques. Pt. was provided with a visual handout. SpO2 94% with HR 78 bpms on 3LO2. Pt. education was provided about A/E use for LE ADLs. Pt. reports having A/E at home. Pt. Declined OOB activity, Reports having been through it already, and  resides in a camper. Pt. Reports everything is close for him to steady himself. Pt. Reports that he has made accommodations, and has found ways of performing tasks that work for him at home. Pt. Reports eating breakfast out daily, and eats dinner with his son, and family. Pt. Continues to benefit from OT services for ADL training, A/E training, and pt. education about home modification, and DME.    Recommendations for follow up therapy are one component of a multi-disciplinary discharge planning process, led by the attending physician.  Recommendations may be updated based on patient status, additional functional criteria and insurance authorization.    Follow Up Recommendations  No OT follow up     Assistance Recommended at Discharge Intermittent Supervision/Assistance  Patient can return home with the following  Assistance with cooking/housework;Assist for transportation;Help with stairs or ramp for entrance;A little help with walking and/or transfers;A little help with bathing/dressing/bathroom   Equipment Recommendations       Recommendations for Other Services      Precautions / Restrictions Precautions Precautions: Fall Restrictions Weight Bearing  Restrictions: No       Mobility Bed Mobility                    Transfers                         Balance                                           ADL either performed or assessed with clinical judgement   ADL                                         General ADL Comments: Pt. edcuation was provided about A/E use for LE ADLs, PLB techniques,a nd energy conservation, work simplifcation techniques    Extremity/Trunk Assessment Upper Extremity Assessment Upper Extremity Assessment: Generalized weakness            Vision Baseline Vision/History: 1 Wears glasses Patient Visual Report: No change from baseline     Perception     Praxis      Cognition Arousal/Alertness: Awake/alert Behavior During Therapy: WFL for tasks assessed/performed Overall Cognitive Status: Within Functional Limits for tasks assessed                                 General Comments: A&Ox4        Exercises  Shoulder Instructions       General Comments Pt on 3L/min via nasal cannula throughout assistance. Pt with continent liquid BM upon PT arrival, requires max encouragement to attempt peri hygiene with pt able to complete. Pt also with small incontinent liquid BM in standing.    Pertinent Vitals/ Pain       Pain Assessment Pain Assessment: No/denies pain  Home Living                                          Prior Functioning/Environment              Frequency  Min 2X/week        Progress Toward Goals  OT Goals(current goals can now be found in the care plan section)  Progress towards OT goals: Progressing toward goals     Plan Frequency remains appropriate    Co-evaluation                 AM-PAC OT "6 Clicks" Daily Activity     Outcome Measure   Help from another person eating meals?: None Help from another person taking care of personal grooming?: None Help from  another person toileting, which includes using toliet, bedpan, or urinal?: A Little Help from another person bathing (including washing, rinsing, drying)?: A Lot Help from another person to put on and taking off regular upper body clothing?: None Help from another person to put on and taking off regular lower body clothing?: A Lot 6 Click Score: 19    End of Session    OT Visit Diagnosis: Unsteadiness on feet (R26.81);Muscle weakness (generalized) (M62.81)   Activity Tolerance Patient tolerated treatment well   Patient Left in bed;with call bell/phone within reach;with bed alarm set   Nurse Communication          Time: 9628-3662 OT Time Calculation (min): 13 min  Charges: OT General Charges $OT Visit: 1 Visit OT Treatments $Self Care/Home Management : 8-22 mins  Harrel Carina, MS, OTR/L   Harrel Carina 06/03/2022, 3:43 PM

## 2022-06-03 NOTE — Consult Note (Signed)
Consultation  Referring Provider:     Hospitalist Admit date: 05/29/2022 Consult date: 06/03/2022       Reason for Consultation:     IDA         HPI:   Randall Vincent is a 82 y.o. 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 Fib on DOAC, CKD, hypertension, and HLD here for heart failure exacerbation that's being treated with lasix gtt. We were consulted for anemia and positive FOBT. Patient has chronic anemia with baseline of 8-9. He has been evaluated for melena in 2021 with unremarkable EGD/Colonoscopy. He denies any melena now, had a bowel movement prior to evaluation that was brown. He refuses to undergo a colonoscopy. He would consider an EGD but given no melena or abdominal complaints and active heart failure exacerbation, he would be high risk. On last hospitalization he refused any luminal evaluation as well. 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  Past Medical History:  Diagnosis Date   A-fib [facility])    a.) CHA2DS2-VASc = 6 (age x 2, CHF, HTN, previous MI, T2DM). b.) Rate/rhythm maintained on amiodarone + diltiazem + carvedilol; chronically anticoagulated using dabigatran. c.) DCCV 07/10/2015, 08/08/2019, 10/22/2019.   Arthritis    Chest pain 06/26/2019   CHF (congestive heart failure) (HCC)    CKD (chronic kidney disease), stage III (HCC)    Coronary artery disease 1991   a.) 2v CABG in 1991. b.) PCI 06/07/2006 --> Vision stents to native LCx and RCA   Hyperlipidemia    Hypertension    Long term current use of anticoagulant    a.) dabigatran   Myocardial infarction (HCC) 1991   a.) LHC --> 50% and 90% LAD lesions; referred to CVTS. 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   a.) LIMA-LAD, SVG-RCA   T2DM (type 2 diabetes mellitus) (HCC)     Past Surgical History:  Procedure Laterality Date   BACK SURGERY  1970   L5-L6 ruptured disk   BLADDER SURGERY  2000   CARDIOVERSION N/A 07/10/2015    CARDIOVERSION N/A 08/08/2019   CARDIOVERSION N/A 10/22/2019   COLON SURGERY     COLONOSCOPY N/A 01/15/2020   Procedure: COLONOSCOPY;  Surgeon: Pasty Spillers, MD;  Location: [facility] ENDOSCOPY;  Service: Endoscopy;  Laterality: N/A;   COLONOSCOPY N/A 01/16/2020   Procedure: COLONOSCOPY;  Surgeon: Pasty Spillers, MD;  Location: [facility] ENDOSCOPY;  Service: Endoscopy;  Laterality: N/A;   CORONARY ARTERY BYPASS GRAFT N/A 01/02/1990   Procedure: 2v CORONARY ARTERY BYPASS GRAFT (LIMA-LAD, SVG-RCA)   ESOPHAGOGASTRODUODENOSCOPY N/A 01/13/2020   Procedure: ESOPHAGOGASTRODUODENOSCOPY (EGD);  Surgeon: Pasty Spillers, MD;  Location: [facility] ENDOSCOPY;  Service: Endoscopy;  Laterality: N/A;   REPLACEMENT TOTAL KNEE BILATERAL Bilateral 2008   STENT PLACEMENT VASCULAR ([facility] HX)  2008   TOTAL KNEE REVISION Left 01/16/2020   Procedure: TOTAL KNEE REVISION;  Surgeon: Lyndle Herrlich, MD;  Location: [facility] ORS;  Service: Orthopedics;  Laterality: Left;   TOTAL KNEE REVISION Left 05/31/2021   Procedure: TOTAL KNEE REVISION;  Surgeon: Lyndle Herrlich, MD;  Location: [facility] ORS;  Service: Orthopedics;  Laterality: Left;    History reviewed. No pertinent family history.   Social History   Tobacco Use   Smoking status: Former    Types: Cigarettes    Quit date: 1991    Years since quitting: 33.0   Smokeless tobacco: Never  Vaping Use  Vaping Use: Never used  Substance Use Topics   Alcohol use: Not Currently    Alcohol/week: 1.0 standard drink of alcohol    Types: 1 Cans of beer per week    Comment: very rarely   Drug use: Never    Prior to Admission medications   Medication Sig Start Date End Date Taking? Authorizing Provider  amiodarone (PACERONE) 200 MG tablet Take 200 mg by mouth daily. 10/29/19  Yes [provider]  apixaban (ELIQUIS) 2.5 MG TABS tablet Take 2.5 mg by mouth 2 (two) times daily.   Yes [provider]  atorvastatin (LIPITOR) 40 MG tablet Take 40 mg by mouth  every evening.  06/18/19  Yes [provider]  carvedilol (COREG) 12.5 MG tablet Take 12.5 mg by mouth 2 (two) times daily. 05/25/22  Yes [provider]  PROAIR RESPICLICK 108 (90 Base) MCG/ACT AEPB Inhale into the lungs.   Yes [provider]  ramipril (ALTACE) 10 MG capsule Take 1 capsule (10 mg total) by mouth daily. 01/31/20  Yes Enedina Finner, MD  Semaglutide, 1 MG/DOSE, 2 MG/1.5ML SOPN Inject 1 mg into the skin every Thursday.   Yes [provider]  torsemide (DEMADEX) 20 MG tablet Take 1 tablet (20 mg total) by mouth 2 (two) times daily. Patient taking differently: Take 10-20 mg by mouth See admin instructions. Take 20 mg by mouth in the morning and 10 mg in the evening 02/09/20  Yes Wouk, Wilfred Curtis, MD  vitamin B-12 (CYANOCOBALAMIN) 1000 MCG tablet Take 1,000 mcg by mouth daily.   Yes [provider]  apixaban (ELIQUIS) 5 MG TABS tablet Take 1 tablet (5 mg total) by mouth 2 (two) times daily. Patient not taking: Reported on 05/29/2022 09/20/21   Cipriano Bunker, MD  carvedilol (COREG) 3.125 MG tablet Take 1 tablet (3.125 mg total) by mouth 2 (two) times daily with a meal. Patient not taking: Reported on 05/29/2022 02/09/20   Wouk, Wilfred Curtis, MD  colchicine 0.6 MG tablet Take 0.6 mg by mouth 2 (two) times daily. Patient not taking: Reported on 05/05/2022 09/06/21   [provider]  diltiazem (CARDIZEM CD) 240 MG 24 hr capsule Take 240 mg by mouth daily. Patient not taking: Reported on 05/29/2022 06/21/21   [provider]  metFORMIN (GLUCOPHAGE-XR) 500 MG 24 hr tablet Take 500 mg by mouth 2 (two) times daily. Patient not taking: Reported on 05/05/2022 04/07/21   [provider]    Current Facility-Administered Medications  Medication Dose Route Frequency Provider Last Rate Last Admin   acetaminophen (TYLENOL) tablet 650 mg  650 mg Oral Q4H PRN Verdene Lennert, MD   650 mg at 06/03/22 1051   amiodarone (PACERONE) tablet 200 mg   200 mg Oral Daily Verdene Lennert, MD   200 mg at 06/03/22 0814   atorvastatin (LIPITOR) tablet 40 mg  40 mg Oral QPM Verdene Lennert, MD   40 mg at 06/02/22 2201   benzonatate (TESSALON) capsule 100 mg  100 mg Oral TID PRN Gillis Santa, MD   100 mg at 06/03/22 1051   bisacodyl (DULCOLAX) EC tablet 5 mg  5 mg Oral QHS Gillis Santa, MD       carvedilol (COREG) tablet 3.125 mg  3.125 mg Oral BID Gillis Santa, MD   3.125 mg at 06/03/22 0813   furosemide (LASIX) 200 mg in dextrose 5 % 100 mL (2 mg/mL) infusion  8 mg/hr Intravenous Continuous Gillis Santa, MD 4 mL/hr at 06/02/22 1600 8 mg/hr at  06/02/22 1600   insulin aspart (novoLOG) injection 0-15 Units  0-15 Units Subcutaneous TID WC Jose Persia, MD       iron sucrose (VENOFER) 300 mg in sodium chloride 0.9 % 250 mL IVPB  300 mg Intravenous Daily Val Riles, MD 176.7 mL/hr at 06/03/22 1221 300 mg at 06/03/22 1221   ondansetron (ZOFRAN) injection 4 mg  4 mg Intravenous Q6H PRN Jose Persia, MD       pantoprazole (PROTONIX) injection 40 mg  40 mg Intravenous Q12H Val Riles, MD   40 mg at 06/03/22 1048   polyethylene glycol (MIRALAX / GLYCOLAX) packet 17 g  17 g Oral Daily Val Riles, MD       spironolactone (ALDACTONE) tablet 12.5 mg  12.5 mg Oral Daily Tristan Schroeder, PA-C   12.5 mg at 06/03/22 3149   Vitamin D (Ergocalciferol) (DRISDOL) 1.25 MG (50000 UNIT) capsule 50,000 Units  50,000 Units Oral Q7 days Val Riles, MD   50,000 Units at 06/01/22 1124    Allergies as of 05/29/2022   (No Known Allergies)     Review of Systems:    All systems reviewed and negative except where noted in HPI.  Review of Systems  Constitutional:  Positive for malaise/fatigue. Negative for chills and fever.  Respiratory:  Positive for cough, sputum production and shortness of breath.   Cardiovascular:  Negative for chest pain.  Gastrointestinal:  Negative for abdominal pain, blood in stool, constipation, diarrhea, melena, nausea and  vomiting.  Musculoskeletal:  Positive for joint pain.  Neurological:  Negative for focal weakness.  Psychiatric/Behavioral:  Negative for substance abuse.   All other systems reviewed and are negative.      Physical Exam:  Vital signs in last 24 hours: Temp:  [97.6 F (36.4 C)-98.7 F (37.1 C)] 97.7 F (36.5 C) (01/12 1620) Pulse Rate:  [71-79] 71 (01/12 1620) Resp:  [16-18] 17 (01/12 1620) BP: (96-115)/(48-63) 98/58 (01/12 1620) SpO2:  [90 %-100 %] 100 % (01/12 1620) Weight:  [96.2 kg] 96.2 kg (01/12 1000) Last BM Date : 06/02/22 General:   Pleasant in NAD Head:  Normocephalic and atraumatic. Eyes:   No icterus.   Conjunctiva pink. Mouth: Mucosa pink moist, no lesions. Neck:  Supple; no masses felt Lungs:  Coarse sounds Heart:  RRR Abdomen:   distended Msk:  2+ edema Neurologic:  Alert and  oriented x4;  Cranial nerves II-XII intact.  Skin:  Warm, dry, pink without significant lesions or rashes. Psych:  Alert and cooperative. Normal affect.  LAB RESULTS: Recent Labs    06/01/22 0451 06/02/22 0242 06/02/22 1542 06/03/22 0430  WBC 5.9 3.5*  --  3.1*  HGB 8.5* 7.6* 8.4* 7.9*  HCT 29.1* 25.6* 28.1* 26.3*  PLT 164 109*  --  93*   BMET Recent Labs    06/01/22 0451 06/02/22 0242 06/03/22 0430  NA 140 140 138  K 3.9 3.8 3.6  CL 103 101 97*  CO2 32 32 33*  GLUCOSE 103* 100* 89  BUN 45* 44* 47*  CREATININE 2.15* 2.00* 1.90*  CALCIUM 8.0* 7.6* 7.7*   LFT No results for input(s): "PROT", "ALBUMIN", "AST", "ALT", "ALKPHOS", "BILITOT", "BILIDIR", "IBILI" in the last 72 hours. PT/INR No results for input(s): "LABPROT", "INR" in the last 72 hours.  STUDIES: No results found.     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 Fib on DOAC, CKD, hypertension, and HLD here for heart failure exacerbation that's being treated with lasix  gtt. We were consulted for anemia and history of IDA. Patient refuses any colonoscopic, would consider EGD if  concern for active bleeding but he denies any melena. His baseline hemoglobin is around 8-9. Discussed with him risks/benefits of no endoscopic evaluation and he wishes to avoid any procedures  - PPI, po is fine - transfuse for hemoglobin of < 8 - ok with DOAC given high CHADSVASC score and no overt GI bleeding - ok to advance diet - monitor for any overt GI bleeding (melena, hematochezia). FOBT shouldn't be used in a hospital setting as it's not indicated for GI bleeding. - will follow peripherally, please call with any questions or concerns  Raylene Miyamoto MD, MPH [facility]

## 2022-06-04 DIAGNOSIS — I4891 Unspecified atrial fibrillation: Secondary | ICD-10-CM | POA: Diagnosis not present

## 2022-06-04 DIAGNOSIS — I872 Venous insufficiency (chronic) (peripheral): Secondary | ICD-10-CM | POA: Diagnosis not present

## 2022-06-04 DIAGNOSIS — N1832 Chronic kidney disease, stage 3b: Secondary | ICD-10-CM | POA: Diagnosis not present

## 2022-06-04 DIAGNOSIS — I5023 Acute on chronic systolic (congestive) heart failure: Secondary | ICD-10-CM | POA: Diagnosis not present

## 2022-06-04 LAB — GLUCOSE, CAPILLARY
Glucose-Capillary: 77 mg/dL (ref 70–99)
Glucose-Capillary: 95 mg/dL (ref 70–99)
Glucose-Capillary: 108 mg/dL — ABNORMAL HIGH (ref 70–99)
Glucose-Capillary: 105 mg/dL — ABNORMAL HIGH (ref 70–99)

## 2022-06-04 LAB — BASIC METABOLIC PANEL WITH GFR
Anion gap: 8 (ref 5–15)
BUN: 47 mg/dL — ABNORMAL HIGH (ref 8–23)
CO2: 34 mmol/L — ABNORMAL HIGH (ref 22–32)
Calcium: 7.8 mg/dL — ABNORMAL LOW (ref 8.9–10.3)
Chloride: 96 mmol/L — ABNORMAL LOW (ref 98–111)
Creatinine, Ser: 1.93 mg/dL — ABNORMAL HIGH (ref 0.61–1.24)
GFR, Estimated: 35 mL/min — ABNORMAL LOW
Glucose, Bld: 97 mg/dL (ref 70–99)
Potassium: 4.5 mmol/L (ref 3.5–5.1)
Sodium: 138 mmol/L (ref 135–145)

## 2022-06-04 LAB — CBC
HCT: 27.7 % — ABNORMAL LOW (ref 39.0–52.0)
Hemoglobin: 8.1 g/dL — ABNORMAL LOW (ref 13.0–17.0)
MCH: 26.7 pg (ref 26.0–34.0)
MCHC: 29.2 g/dL — ABNORMAL LOW (ref 30.0–36.0)
MCV: 91.4 fL (ref 80.0–100.0)
Platelets: 109 K/uL — ABNORMAL LOW (ref 150–400)
RBC: 3.03 MIL/uL — ABNORMAL LOW (ref 4.22–5.81)
RDW: 18.3 % — ABNORMAL HIGH (ref 11.5–15.5)
WBC: 3.4 K/uL — ABNORMAL LOW (ref 4.0–10.5)
nRBC: 0 % (ref 0.0–0.2)

## 2022-06-04 MED ORDER — ORAL CARE MOUTH RINSE: 15.0000 mL | OROMUCOSAL | Status: DC | PRN

## 2022-06-04 MED ORDER — HYDRALAZINE HCL 25 MG PO TABS
25.0000 mg | ORAL_TABLET | Freq: Three times a day (TID) | ORAL | Status: DC
  Administered 2022-06-04 – 2022-06-06 (×4): 25 mg via ORAL
  Filled 2022-06-04 (×4): qty 1

## 2022-06-04 MED ORDER — ISOSORBIDE MONONITRATE ER 30 MG PO TB24
30.0000 mg | ORAL_TABLET | Freq: Every day | ORAL | Status: DC
  Administered 2022-06-04 – 2022-06-11 (×7): 30 mg via ORAL
  Filled 2022-06-04 (×9): qty 1

## 2022-06-04 NOTE — Progress Notes (Signed)
Progress Note   Patient: Randall Vincent CNO:[id number] DOB: March 04, 1941 DOA: 05/29/2022     5 DOS: the patient was seen and examined on 06/04/2022   Brief hospital course: Randall Vincent is a 82 y.o. 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 ED w/up: Chest x-ray was obtained that demonstrated interstitial edema, mild, with bilateral pleural effusions. Due to failure of outpatient treatment, [facility] contacted for admission for acute on chronic heart failure.      Assessment and Plan:   # Acute on chronic systolic heart failure  Patient has a history of moderately reduced EF with last EF of 45-50%, follows with Dr. Clayborn Bigness.  Despite increasing doses of home torsemide, patient presents with marked pitting edema extending up to the mid abdomen. - S/p Lasix 60 mg IV once and  Lasix 40 mg IV BID On 1/8 started Lasix IV infusion due to anasarca -continue for now - Strict in and out Net IO Since Admission: -7,844.34 mL [06/04/22 1451]  - Daily weights - Continue home GDMT TTE shows LVEF 30 to 29%, grade 3 diastolic dysfunction, right ventricular failure, moderate pulm hypertension, moderate MR and moderate to severe TR Cardiology following 1/10 metolazone 2.5 mg p.o. x 1 dose ordered by cardiology 1/11 started spironolactone 12.5 mg p.o. daily as per cardiology 1/13: Cardiology started hydralazine and Imdur  Anemia of chronic disease, iron deficiency and occult GI bleeding, unknown source - no overt GI bleeding - iron level 16, transferrin saturation 7% so started Venofer 3 to milligrams IV daily x 3 doses, start oral iron supplement on discharge.  B12 and folate within normal range. -No plan for luminal evaluation per GI at this time.  GI recommended to  continue Eliquis for now given benefit overweigh the risk of bleeding Monitor H&H and transfuse if hemoglobin less than 8   Stage 3b chronic kidney disease (CKD) Patient's renal function frequently fluctuates between 1.6 and 2.2.   Currently 1.93, previously 1.64.  Possibly component of cardiorenal syndrome.  Will monitor closely while diuresing. - Daily BMP - Monitor urine output   Atrial fibrillation status post cardioversion 10/22/19  Currently rate controlled. Continue home amiodarone 200 POD, Eliquis 2.5 twice daily, and Coreg 3.125 twice daily   Venous stasis dermatitis of both lower extremities Per Dr. Etta Quill most recent note, plans were to obtain ABIs.  Will do so at this time and if no evidence of PAD, plan to order Unna boots ABI:  Right ABI:  1.47 and Left ABI:  1.55 Right Lower Extremity: Segmental Doppler at the right ankle demonstrates monophasic waveforms Left Lower Extremity: Segmental Doppler at the left ankle demonstrates triphasic posterior tibial and dorsalis pedis waveform. - pt may need Unna boots, will reassess after diuresis   Essential hypertension - Continue Coreg, hydralazine, Imdur, spironolactone, amiodarone   DM (diabetes mellitus), type 2 ([place]) Well-managed with semaglutide with last A1c of 5.9% 4 weeks ago. - Hold home semaglutide while admitted - SSI, moderate   Vitamin D deficiency: started vitamin D 50,000 units p.o. weekly, follow with PCP to repeat vitamin D level after 3 to 6 months.   Body mass index is 35.14 kg/m.  Interventions:          Subjective: Reports cough, shortness of breath, abdominal distention and  weakness  Physical Exam: Vitals:   06/03/22 2341 06/04/22 0409 06/04/22 0737 06/04/22 1150  BP: (!) 106/57 (!) 101/56 102/63 (!) 118/52  Pulse:  78 82 78  Resp: 20 19 18 18   Temp: 97.7 F (36.5 C) 97.9 F (36.6 C) 98 F (36.7 C) 97.9 F (36.6 C)  TempSrc: Axillary Oral  Oral  SpO2: 97% 100% 99% 100%  Weight:       Height:       Physical Exam: General: NAD, lying comfortably, mild SOB Appear in no distress, affect appropriate Eyes: Right eye PERRLA, (left eye blind since childhood) ENT: Oral Mucosa Clear, moist  Neck: no JVD,  Cardiovascular: S1 and S2 Present, no Murmur,  Respiratory: good respiratory effort, Bilateral Air entry equal and Decreased, mild bilateral crackles, no wheezes Abdomen: Bowel Sound present, Soft and no tenderness, distended Skin: no rashes Extremities: 2 + Pedal edema, no calf tenderness, chronic venous stasis pigmentation Neurologic: Awake and alert, nonfocal   Data Reviewed:  Creatinine 1.93, hemoglobin 8.1  Family Communication: None at bedside    Diet: Heart healthy diet, fluid striction 1.5 L/day DVT Prophylaxis: Therapeutic Anticoagulation with Eliquis     Advance goals of care discussion: DNR    Disposition: Status is: Inpatient Remains inpatient appropriate because: Pt is from Home, admitted with volume overload on IV Lasix infusion, still has anasarca, which precludes a safe discharge. Discharge to home, when clinically stable, may need 2-3 days for 2 diuresis per cardiology .    Planned Discharge Destination: Home with Home Health    Time spent: 35 minutes  Author: Max Sane, MD 06/04/2022 2:06 PM  For on call review [email or web address].

## 2022-06-04 NOTE — Progress Notes (Signed)
[facility] Cardiology    SUBJECTIVE: Patient states he feels somewhat better reduced shortness of breath reduce fatigue and weakness resting comfortably in bed   Vitals:   06/03/22 2341 06/04/22 0409 06/04/22 0737 06/04/22 1150  BP: (!) 106/57 (!) 101/56 102/63 (!) 118/52  Pulse:  78 82 78  Resp: 20 19 18 18   Temp: 97.7 F (36.5 C) 97.9 F (36.6 C) 98 F (36.7 C) 97.9 F (36.6 C)  TempSrc: Axillary Oral  Oral  SpO2: 97% 100% 99% 100%  Weight:      Height:         Intake/Output Summary (Last 24 hours) at 06/04/2022 1435 Last data filed at 06/04/2022 1100 Gross per 24 hour  Intake 426.7 ml  Output 3075 ml  Net -2648.3 ml      PHYSICAL EXAM  General: Well developed, well nourished, in no acute distress HEENT:  Normocephalic and atramatic Neck:  No JVD.  Lungs: Clear bilaterally to auscultation and percussion. Heart: HRRR . Normal S1 and S2 without gallops or murmurs.  Abdomen: Bowel sounds are positive, abdomen soft and non-tender  Msk:  Back normal, normal gait. Normal strength and tone for age. Extremities: No clubbing, cyanosis or edema.   Neuro: Alert and oriented X 3. Psych:  Good affect, responds appropriately   LABS: Basic Metabolic Panel: Recent Labs    06/02/22 0242 06/03/22 0430 06/04/22 0459  NA 140 138 138  K 3.8 3.6 4.5  CL 101 97* 96*  CO2 32 33* 34*  GLUCOSE 100* 89 97  BUN 44* 47* 47*  CREATININE 2.00* 1.90* 1.93*  CALCIUM 7.6* 7.7* 7.8*  MG 2.2 2.3  --   PHOS 3.4 4.0  --    Liver Function Tests: No results for input(s): "AST", "ALT", "ALKPHOS", "BILITOT", "PROT", "ALBUMIN" in the last 72 hours. No results for input(s): "LIPASE", "AMYLASE" in the last 72 hours. CBC: Recent Labs    06/03/22 0430 06/04/22 0459  WBC 3.1* 3.4*  HGB 7.9* 8.1*  HCT 26.3* 27.7*  MCV 90.4 91.4  PLT 93* 109*   Cardiac Enzymes: No results for input(s): "CKTOTAL", "CKMB", "CKMBINDEX", "TROPONINI" in the last 72 hours. BNP: Invalid input(s):  "POCBNP" D-Dimer: No results for input(s): "DDIMER" in the last 72 hours. Hemoglobin A1C: No results for input(s): "HGBA1C" in the last 72 hours. Fasting Lipid Panel: No results for input(s): "CHOL", "HDL", "LDLCALC", "TRIG", "CHOLHDL", "LDLDIRECT" in the last 72 hours. Thyroid Function Tests: No results for input(s): "TSH", "T4TOTAL", "T3FREE", "THYROIDAB" in the last 72 hours.  Invalid input(s): "FREET3" Anemia Panel: Recent Labs    06/02/22 0902  VITAMINB12 571  FOLATE 9.6  TIBC 241*  IRON 16*    No results found.   Echo moderately depressed left ventricular function EF around 30 to 35%  TELEMETRY: Normal sinus rhythm at 87:  ASSESSMENT AND PLAN:  Principal Problem:   Acute on chronic heart failure [facility]) Active Problems:   Atrial fibrillation status post cardioversion 10/22/19 [facility])   Essential hypertension   DM (diabetes mellitus), type 2 (HCC)   Stage 3b chronic kidney disease (CKD) (HCC)   Venous stasis dermatitis of both lower extremities    Plan Acute on chronic systolic congestive heart failure improving continue diuresis IV status post metolazone consider adding hydralazine /Imdur because of renal insufficiency Consider switching from Lasix drip to IV Lasix and then to p.o. Continue diabetes management and control Gradually increase activity probably start with physical therapy Anticoagulation for atrial fibrillation should be maintained Continue hypertension  management and control   Yolonda Kida, MD, 06/04/2022 2:35 PM

## 2022-06-05 DIAGNOSIS — I5023 Acute on chronic systolic (congestive) heart failure: Secondary | ICD-10-CM | POA: Diagnosis not present

## 2022-06-05 LAB — BASIC METABOLIC PANEL
Anion gap: 7 (ref 5–15)
BUN: 52 mg/dL — ABNORMAL HIGH (ref 8–23)
CO2: 35 mmol/L — ABNORMAL HIGH (ref 22–32)
Calcium: 7.6 mg/dL — ABNORMAL LOW (ref 8.9–10.3)
Chloride: 94 mmol/L — ABNORMAL LOW (ref 98–111)
Creatinine, Ser: 2.03 mg/dL — ABNORMAL HIGH (ref 0.61–1.24)
GFR, Estimated: 33 mL/min — ABNORMAL LOW (ref >60)
Glucose, Bld: 93 mg/dL (ref 70–99)
Potassium: 4.2 mmol/L (ref 3.5–5.1)
Sodium: 136 mmol/L (ref 135–145)

## 2022-06-05 LAB — GLUCOSE, CAPILLARY
Glucose-Capillary: 84 mg/dL (ref 70–99)
Glucose-Capillary: 93 mg/dL (ref 70–99)
Glucose-Capillary: 94 mg/dL (ref 70–99)

## 2022-06-05 LAB — CBC
HCT: 25.8 % — ABNORMAL LOW (ref 39.0–52.0)
Hemoglobin: 7.8 g/dL — ABNORMAL LOW (ref 13.0–17.0)
MCH: 27.3 pg (ref 26.0–34.0)
MCHC: 30.2 g/dL (ref 30.0–36.0)
MCV: 90.2 fL (ref 80.0–100.0)
Platelets: 104 10*3/uL — ABNORMAL LOW (ref 150–400)
RBC: 2.86 MIL/uL — ABNORMAL LOW (ref 4.22–5.81)
RDW: 18.2 % — ABNORMAL HIGH (ref 11.5–15.5)
WBC: 3.8 10*3/uL — ABNORMAL LOW (ref 4.0–10.5)
nRBC: 0 % (ref 0.0–0.2)

## 2022-06-05 MED ORDER — DICLOFENAC SODIUM 1 % EX GEL
4.0000 g | Freq: Four times a day (QID) | CUTANEOUS | Status: DC | PRN
  Administered 2022-06-05: 4 g via TOPICAL
  Filled 2022-06-05: qty 100

## 2022-06-05 MED ORDER — FUROSEMIDE 10 MG/ML IJ SOLN
40.0000 mg | Freq: Two times a day (BID) | INTRAMUSCULAR | Status: DC
  Administered 2022-06-06 (×2): 40 mg via INTRAVENOUS
  Filled 2022-06-05 (×4): qty 4

## 2022-06-05 NOTE — Progress Notes (Signed)
PROGRESS NOTE    Randall Vincent  UUV:[id number] DOB: June 29, 1940 DOA: 05/29/2022 PCP: Dionne Bucy., MD   Brief Narrative:  Randall Vincent is a 82 y.o. 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  Patient has a history of moderately reduced EF with last EF of 45-50%, follows with Dr. Clayborn Bigness.  Despite increasing doses of home torsemide, patient presents with marked pitting edema extending up to the mid abdomen. - S/p Lasix 60 mg IV once and  Lasix 40 mg IV BID  ED w/up: Chest x-ray was obtained that demonstrated interstitial edema, mild, with bilateral pleural effusions. Due to failure of outpatient treatment, [facility] contacted for admission for acute on chronic heart failure.   Assessment & Plan:   Acute on chronic systolic heart failure  -On 1/8 started Lasix IV infusion due to anasarca  -TTE shows LVEF 30 to 47%, grade 3 diastolic dysfunction, right ventricular failure, moderate pulm hypertension, moderate MR and moderate to severe TR Cardiology following 1/10 metolazone 2.5 mg p.o. x 1 dose ordered by cardiology 1/11 started spironolactone 12.5 mg p.o. daily as per cardiology 1/13: Cardiology started hydralazine and Imdur -On 1/14: Switch to Lasix 40 IV twice daily    Anemia of chronic disease, iron deficiency and occult GI bleeding, unknown source - no overt GI bleeding - iron level 16, transferrin saturation 7% so started Venofer 3 to milligrams IV daily x 3 doses, start oral iron supplement on discharge.  B12 and folate within normal range. -No plan for luminal evaluation per GI at this time.  GI recommended to continue Eliquis for now given benefit overweigh the risk of bleeding -Monitor H&H and transfuse if hemoglobin less than 8   Stage  3b chronic kidney disease (CKD) -Patient's renal function frequently fluctuates between 1.6 and 2.2.   - Possibly component of cardiorenal syndrome.  Will monitor closely while diuresing. -Daily BMP -Monitor urine output   Atrial fibrillation status post cardioversion 10/22/19  -Currently rate controlled. -Continue home amiodarone 200 POD, Eliquis 2.5 twice daily, and Coreg 3.125 twice daily   Venous stasis dermatitis of both lower extremities -Reviewed arterial ABI -pt may need Unna boots, will reassess after diuresis   Essential hypertension - Continue Coreg, hydralazine, Imdur, spironolactone, amiodarone   DM (diabetes mellitus), type 2 ([place]) -Well-managed with semaglutide with last A1c of 5.9% 4 weeks ago. -Hold home semaglutide while admitted -SSI, moderate   Vitamin D deficiency: started vitamin D 50,000 units p.o. weekly, follow with PCP to repeat vitamin D level after 3 to 6 months.   DVT prophylaxis: Eliquis Code Status: DNR Family Communication:  None present at bedside.  Plan of care discussed with patient in length and he verbalized understanding and agreed with it. Disposition Plan: To be determined  Consultants:  GI Cardiology  Procedures:  None  Antimicrobials:  None  Status is: Inpatient    Subjective: Patient seen and examined.  Resting comfortably on the bed.  Reports that overall he feels better.  His breathing and swelling have improved.  No acute events overnight.  Objective: Vitals:   06/05/22 0019 06/05/22 0412 06/05/22 0750 06/05/22 1152  BP: (!) 112/58 124/67 (!) 106/57 (!) 88/46  Pulse: 84 83 80 71  Resp: 18 20 18 18   Temp: 98.2 F (  36.8 C) 98.1 F (36.7 C) 98 F (36.7 C) 98 F (36.7 C)  TempSrc: Oral Oral Oral Oral  SpO2: 98% 100% 99% 94%  Weight:      Height:        Intake/Output Summary (Last 24 hours) at 06/05/2022 1314 Last data filed at 06/05/2022 1233 Gross per 24 hour  Intake 933.23 ml  Output 4025 ml  Net -3091.77 ml    Filed Weights   06/01/22 0503 06/03/22 1000 06/04/22 0800  Weight: 102.4 kg 96.2 kg 95.9 kg    Examination:  General exam: Appears calm and comfortable, on nasal cannula, communicating well Respiratory system: Bibasilar rhonchi.  No wheezing  cardiovascular system: S1 & S2 heard, RRR. No JVD, murmurs, rubs, gallops or clicks. No pedal edema. Gastrointestinal system: Abdomen is nondistended, soft and nontender. No organomegaly or masses felt. Normal bowel sounds heard. Central nervous system: Alert and oriented. No focal neurological deficits. Extremities: Symmetric 5 x 5 power. Skin: Chronic venous stasis noted in both lower extremities psychiatry: Judgement and insight appear normal. Mood & affect appropriate.    Data Reviewed: I have personally reviewed following labs and imaging studies  CBC: Recent Labs  Lab 05/30/22 0225 05/31/22 0500 06/01/22 0451 06/02/22 0242 06/02/22 1542 06/03/22 0430 06/04/22 0459 06/05/22 0338  WBC 4.8   < > 5.9 3.5*  --  3.1* 3.4* 3.8*  NEUTROABS 3.6  --   --   --   --   --   --   --   HGB 7.9*   < > 8.5* 7.6* 8.4* 7.9* 8.1* 7.8*  HCT 26.8*   < > 29.1* 25.6* 28.1* 26.3* 27.7* 25.8*  MCV 91.2   < > 91.8 90.8  --  90.4 91.4 90.2  PLT 150   < > 164 109*  --  93* 109* 104*   < > = values in this interval not displayed.   Basic Metabolic Panel: Recent Labs  Lab 05/30/22 0225 05/31/22 0500 06/01/22 0451 06/02/22 0242 06/03/22 0430 06/04/22 0459 06/05/22 0338  NA 140 138 140 140 138 138 136  K 4.0 4.5 3.9 3.8 3.6 4.5 4.2  CL 105 103 103 101 97* 96* 94*  CO2 29 28 32 32 33* 34* 35*  GLUCOSE 101* 97 103* 100* 89 97 93  BUN 48* 46* 45* 44* 47* 47* 52*  CREATININE 2.37* 2.26* 2.15* 2.00* 1.90* 1.93* 2.03*  CALCIUM 7.7* 7.8* 8.0* 7.6* 7.7* 7.8* 7.6*  MG 2.2 2.4 2.4 2.2 2.3  --   --   PHOS  --  4.6 4.1 3.4 4.0  --   --    GFR: Estimated Creatinine Clearance: 32.6 mL/min (A) (by C-G formula based on SCr of 2.03 mg/dL (H)). Liver  Function Tests: No results for input(s): "AST", "ALT", "ALKPHOS", "BILITOT", "PROT", "ALBUMIN" in the last 168 hours. No results for input(s): "LIPASE", "AMYLASE" in the last 168 hours. No results for input(s): "AMMONIA" in the last 168 hours. Coagulation Profile: No results for input(s): "INR", "PROTIME" in the last 168 hours. Cardiac Enzymes: No results for input(s): "CKTOTAL", "CKMB", "CKMBINDEX", "TROPONINI" in the last 168 hours. BNP (last 3 results) No results for input(s): "PROBNP" in the last 8760 hours. HbA1C: No results for input(s): "HGBA1C" in the last 72 hours. CBG: Recent Labs  Lab 06/04/22 1113 06/04/22 1624 06/04/22 2052 06/05/22 0746 06/05/22 1149  GLUCAP 95 108* 105* 84 93   Lipid Profile: No results for input(s): "CHOL", "HDL", "LDLCALC", "TRIG", "CHOLHDL", "LDLDIRECT" in the  last 72 hours. Thyroid Function Tests: No results for input(s): "TSH", "T4TOTAL", "FREET4", "T3FREE", "THYROIDAB" in the last 72 hours. Anemia Panel: No results for input(s): "VITAMINB12", "FOLATE", "FERRITIN", "TIBC", "IRON", "RETICCTPCT" in the last 72 hours. Sepsis Labs: No results for input(s): "PROCALCITON", "LATICACIDVEN" in the last 168 hours.  No results found for this or any previous visit (from the past 240 hour(s)).    Radiology Studies: No results found.  Scheduled Meds:  amiodarone  200 mg Oral Daily   apixaban  2.5 mg Oral BID   atorvastatin  40 mg Oral QPM   bisacodyl  5 mg Oral QHS   carvedilol  3.125 mg Oral BID   hydrALAZINE  25 mg Oral Q8H   insulin aspart  0-15 Units Subcutaneous TID WC   isosorbide mononitrate  30 mg Oral Daily   pantoprazole (PROTONIX) IV  40 mg Intravenous Q12H   polyethylene glycol  17 g Oral Daily   spironolactone  12.5 mg Oral Daily   Vitamin D (Ergocalciferol)  50,000 Units Oral Q7 days   Continuous Infusions:  furosemide (LASIX) 200 mg in dextrose 5 % 100 mL (2 mg/mL) infusion Stopped (06/05/22 0949)     LOS: 6 days   Time  spent: 35 minutes   Susumu Hackler Estill Cotta, MD [facility]  If 7PM-7AM, please contact night-coverage [email or web address] 06/05/2022, 1:14 PM

## 2022-06-05 NOTE — Progress Notes (Signed)
Mobility Specialist - Progress Note    06/05/22 1700  Mobility  Activity Ambulated with assistance in hallway;Stood at bedside;Dangled on edge of bed  Level of Assistance Standby assist, set-up cues, supervision of patient - no hands on  Assistive Device Front wheel walker  Distance Ambulated (ft) 160 ft  Activity Response Tolerated well  Mobility Referral Yes  $Mobility charge 1 Mobility   Pt resting in bed on 3L upon entry. Pt STS and ambulates to hallway SBA. Pt took x5 small standing rest breaks during around NS. Pt returned to bed and left with needs in reach and bed alarm activated.   Loma Sender Mobility Specialist 06/05/22, 5:13 PM

## 2022-06-05 NOTE — Progress Notes (Signed)
Mobility Specialist - Progress Note    06/05/22 1715  Mobility  Activity Ambulated with assistance to bathroom;Stood at bedside  Level of Assistance Standby assist, set-up cues, supervision of patient - no hands on  Assistive Device Front wheel walker  Distance Ambulated (ft) 10 ft  Activity Response Tolerated well  Mobility Referral Yes  $Mobility charge 1 Mobility   Pt resting in bed on RA upon entry. Pt STS and ambulates to bathroom SBA. Pt had a small accident on the way to bathroom. Pt returned to bed and left with needs in reach; bed alarm activated.   Loma Sender Mobility Specialist 06/05/22, 5:20 PM

## 2022-06-05 NOTE — Progress Notes (Signed)
[facility] Cardiology    SUBJECTIVE: Patient states to be doing reasonably well resting comfortably denies any palpitations tachycardia denies any shortness of breath still has some mild weakness and fatigue   Vitals:   06/05/22 0750 06/05/22 1152 06/05/22 1400 06/05/22 1406  BP: (!) 106/57 (!) 88/46 (!) 91/50 (!) 92/50  Pulse: 80 71    Resp: 18 18    Temp: 98 F (36.7 C) 98 F (36.7 C)    TempSrc: Oral Oral    SpO2: 99% 94%    Weight:      Height:         Intake/Output Summary (Last 24 hours) at 06/05/2022 1538 Last data filed at 06/05/2022 1233 Gross per 24 hour  Intake 573.23 ml  Output 3000 ml  Net -2426.77 ml      PHYSICAL EXAM  General: Well developed, well nourished, in no acute distress HEENT:  Normocephalic and atramatic Neck:  No JVD.  Lungs: Clear bilaterally to auscultation and percussion. Heart: HRRR . Normal S1 and S2 without gallops or murmurs.  Abdomen: Bowel sounds are positive, abdomen soft and non-tender  Msk:  Back normal, normal gait. Normal strength and tone for age. Extremities: No clubbing, cyanosis or edema.   Neuro: Alert and oriented X 3. Psych:  Good affect, responds appropriately   LABS: Basic Metabolic Panel: Recent Labs    06/03/22 0430 06/04/22 0459 06/05/22 0338  NA 138 138 136  K 3.6 4.5 4.2  CL 97* 96* 94*  CO2 33* 34* 35*  GLUCOSE 89 97 93  BUN 47* 47* 52*  CREATININE 1.90* 1.93* 2.03*  CALCIUM 7.7* 7.8* 7.6*  MG 2.3  --   --   PHOS 4.0  --   --    Liver Function Tests: No results for input(s): "AST", "ALT", "ALKPHOS", "BILITOT", "PROT", "ALBUMIN" in the last 72 hours. No results for input(s): "LIPASE", "AMYLASE" in the last 72 hours. CBC: Recent Labs    06/04/22 0459 06/05/22 0338  WBC 3.4* 3.8*  HGB 8.1* 7.8*  HCT 27.7* 25.8*  MCV 91.4 90.2  PLT 109* 104*   Cardiac Enzymes: No results for input(s): "CKTOTAL", "CKMB", "CKMBINDEX", "TROPONINI" in the last 72 hours. BNP: Invalid input(s):  "POCBNP" D-Dimer: No results for input(s): "DDIMER" in the last 72 hours. Hemoglobin A1C: No results for input(s): "HGBA1C" in the last 72 hours. Fasting Lipid Panel: No results for input(s): "CHOL", "HDL", "LDLCALC", "TRIG", "CHOLHDL", "LDLDIRECT" in the last 72 hours. Thyroid Function Tests: No results for input(s): "TSH", "T4TOTAL", "T3FREE", "THYROIDAB" in the last 72 hours.  Invalid input(s): "FREET3" Anemia Panel: No results for input(s): "VITAMINB12", "FOLATE", "FERRITIN", "TIBC", "IRON", "RETICCTPCT" in the last 72 hours.  No results found.   Echo moderate to severely depressed left ventricular function EF of 30 to 35%  TELEMETRY: Sinus rhythm at 71 nonspecific findings:  ASSESSMENT AND PLAN:  Principal Problem:   Acute on chronic heart failure [facility]) Active Problems:   Atrial fibrillation status post cardioversion 10/22/19 [facility])   Essential hypertension   DM (diabetes mellitus), type 2 ([facility])   Stage 3b chronic kidney disease (CKD) ([facility])   Venous stasis dermatitis of both lower extremities   Plan Moderate to severely depressed left ventricular function globally systolically continue heart failure therapy to help with dyspnea shortness of breath symptoms Atrial fibrillation converted to sinus rhythm continue amiodarone Eliquis Coreg Congestive heart failure acute on chronic patient currently on ramipril Coreg on Lasix drip will consider switching to IV and then subsequently  p.o. Continue Lipitor therapy for lipid management Increase activity possibly with physical therapy is important to help   Yolonda Kida, MD 06/05/2022 3:38 PM

## 2022-06-06 DIAGNOSIS — I5023 Acute on chronic systolic (congestive) heart failure: Secondary | ICD-10-CM | POA: Diagnosis not present

## 2022-06-06 LAB — CBC
HCT: 24.7 % — ABNORMAL LOW (ref 39.0–52.0)
Hemoglobin: 7.3 g/dL — ABNORMAL LOW (ref 13.0–17.0)
MCH: 26.6 pg (ref 26.0–34.0)
MCHC: 29.6 g/dL — ABNORMAL LOW (ref 30.0–36.0)
MCV: 90.1 fL (ref 80.0–100.0)
Platelets: 91 10*3/uL — ABNORMAL LOW (ref 150–400)
RBC: 2.74 MIL/uL — ABNORMAL LOW (ref 4.22–5.81)
RDW: 17.9 % — ABNORMAL HIGH (ref 11.5–15.5)
WBC: 3.1 10*3/uL — ABNORMAL LOW (ref 4.0–10.5)
nRBC: 0 % (ref 0.0–0.2)

## 2022-06-06 LAB — MAGNESIUM: Magnesium: 2.2 mg/dL (ref 1.7–2.4)

## 2022-06-06 LAB — BASIC METABOLIC PANEL
Anion gap: 7 (ref 5–15)
BUN: 53 mg/dL — ABNORMAL HIGH (ref 8–23)
CO2: 34 mmol/L — ABNORMAL HIGH (ref 22–32)
Calcium: 7.5 mg/dL — ABNORMAL LOW (ref 8.9–10.3)
Chloride: 94 mmol/L — ABNORMAL LOW (ref 98–111)
Creatinine, Ser: 2.02 mg/dL — ABNORMAL HIGH (ref 0.61–1.24)
GFR, Estimated: 33 mL/min — ABNORMAL LOW (ref >60)
Glucose, Bld: 88 mg/dL (ref 70–99)
Potassium: 4.2 mmol/L (ref 3.5–5.1)
Sodium: 135 mmol/L (ref 135–145)

## 2022-06-06 LAB — GLUCOSE, CAPILLARY
Glucose-Capillary: 82 mg/dL (ref 70–99)
Glucose-Capillary: 99 mg/dL (ref 70–99)
Glucose-Capillary: 88 mg/dL (ref 70–99)
Glucose-Capillary: 90 mg/dL (ref 70–99)

## 2022-06-06 LAB — PREPARE RBC (CROSSMATCH)

## 2022-06-06 MED ORDER — HYDROCERIN EX CREA
TOPICAL_CREAM | Freq: Two times a day (BID) | CUTANEOUS | Status: DC
  Filled 2022-06-06: qty 113

## 2022-06-06 MED ORDER — PANTOPRAZOLE SODIUM 40 MG PO TBEC
40.0000 mg | DELAYED_RELEASE_TABLET | Freq: Two times a day (BID) | ORAL | Status: DC
  Administered 2022-06-06 – 2022-06-11 (×10): 40 mg via ORAL
  Filled 2022-06-06 (×10): qty 1

## 2022-06-06 MED ORDER — SODIUM CHLORIDE 0.9% IV SOLUTION: Freq: Once | INTRAVENOUS | Status: AC

## 2022-06-06 NOTE — Progress Notes (Signed)
[facility] Progress Note  Patient: Randall Vincent    QMV:[id number]  DOA: 05/29/2022     Date of Service: the patient was seen and examined on 06/06/2022  Chief Complaint  Patient presents with   Shortness of Breath   Brief hospital course: Randall Vincent is a 82 y.o. 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 ED w/up: Chest x-ray was obtained that demonstrated interstitial edema, mild, with bilateral pleural effusions. Due to failure of outpatient treatment, [facility] contacted for admission for acute on chronic heart failure.    Assessment and Plan:  # Acute on chronic systolic heart failure  Patient has a history of moderately reduced EF with last EF of 45-50%, follows with Dr. Juliann Pares.  Despite increasing doses of home torsemide, patient presents with marked pitting edema extending up to the mid abdomen. - S/p Lasix 60 mg IV once and  Lasix 40 mg IV BID - Strict in and out - Daily weights - Continue home GDMT TTE shows LVEF 30 to 35%, grade 3 diastolic dysfunction, right ventricular failure, moderate pulm hypertension, moderate MR and moderate to severe TR Cardiology consulted for further recommendation. Metolazone 2.5 mg p.o. x 1 dose on 1/10 and 1/12  ordered by cardiology 1/11 started spironolactone 12.5 mg p.o. daily as per cardiology On 1/8 started Lasix IV infusion due to anasarca 1/13 DC to Lasix infusion, s/p Lasix IV 60 mg x 1 dose, 40 mg x 2 1/14 started Lasix 40 mg x BID, 3 more doses and then start torsemide home dose as per cardiology 1/13 Started Imdur 30 mg p.o. daily   Anemia of chronic disease, iron deficiency and occult GI bleeding, unknown source FOBT positive but no overt GI bleeding Discussed with GI, patient is not  stable for EGD at this time and no overt GI bleeding, patient refusing colonoscopy.  GI recommended to continue Eliquis for now given benefit overweigh the risk of bleeding Monitor H&H and transfuse if hemoglobin less than 8 1/15 Hb 7.3, transfuse 1 unit of PRBC Monitor H&H and transfuse if hemoglobin less than 8   Iron deficiency, iron level 16, transferrin saturation 7% so started Venofer 300 mg IV daily x 3 doses, start oral iron supplement on discharge.  B12 and folate within normal range.  Stage 3b chronic kidney disease (CKD) Patient's renal function frequently fluctuates between 1.6 and 2.2.   Currently 2.18, previously 1.64.  I wonder if there is a component of cardiorenal syndrome.  Will monitor closely while diuresing. - Daily BMP - Monitor urine output   Atrial fibrillation status post cardioversion 10/22/19  Currently rate controlled. Continue home amiodarone 200 POD, Eliquis 2.5 twice daily, and Coreg 3.125 twice daily   Venous stasis dermatitis of both lower extremities Per Dr. Glennis Brink most recent note, plans were to obtain ABIs.  Will do so at this time and if no evidence of PAD, plan to order Unna boots ABI:  Right ABI:  1.47 and Left ABI:  1.55 Right Lower Extremity: Segmental Doppler at the right ankle demonstrates monophasic waveforms Left Lower Extremity: Segmental Doppler at the left ankle demonstrates triphasic posterior tibial and dorsalis pedis waveform. - pt may need Unna boots, will reassess after diuresis 1/15 apply Eucerin cream bilateral lower extremities due to dryness  Essential hypertension - Continue home antihypertensives   DM (diabetes mellitus), type 2 ([place]) Well-managed with semaglutide with last A1c of 5.9% 4 weeks ago. - Hold home semaglutide while admitted - SSI, moderate   Vitamin D deficiency: started vitamin D 50,000 units p.o. weekly, follow with PCP to repeat vitamin D level after 3 to 6 months.  Body mass index is 35.14 kg/m.   Interventions:     Diet: Heart healthy diet, fluid striction 1.5 L/day DVT Prophylaxis: Therapeutic Anticoagulation with Eliquis    Advance goals of care discussion: DNR  Family Communication: family was not present at bedside, at the time of interview.  The pt provided permission to discuss medical plan with the family. Opportunity was given to ask question and all questions were answered satisfactorily.   Disposition:  Pt is from Home, admitted with volume overload on IV Lasix infusion, still has anasarca, which precludes a safe discharge. Discharge to home, when clinically stable, may need 2-3 days with IV diuresis.    Subjective: No significant overnight events, overall patient feels improvement in the edema, still has edema in the bilateral flank area in the abdominal wall, lower extremity edema has almost resolved.  Patient denies any GI bleeding, patient is losing hemoglobin, seems asymptomatic but hemoglobin is continuously dropping and patient is on anticoagulation so would like to keep hemoglobin above 8.  Patient agreed with the blood transfusion.   Physical Exam: General: NAD, lying comfortably, mild SOB Appear in no distress, affect appropriate Eyes: Right eye PERRLA, (left eye blind since childhood) ENT: Oral Mucosa Clear, moist  Neck: no JVD,  Cardiovascular: S1 and S2 Present, no Murmur,  Respiratory: good respiratory effort, Bilateral Air entry equal and Decreased, mild bilateral crackles, no wheezes Abdomen: Bowel Sound present, Soft and no tenderness, edematous abdominal wall Skin: no rashes Extremities: 3 + Pedal edema, no calf tenderness, chronic venous stasis pigmentation Neurologic: without any new focal findings Gait not checked due to patient safety concerns  Vitals:   06/06/22 0417 06/06/22 0508 06/06/22 0803 06/06/22 1259  BP: (!) 104/48 (!) 111/53 (!) 118/53 (!) 100/49  Pulse: 70 67 82 71  Resp: (!) 24 18 (!) 24 20  Temp: 97.7 F (36.5 C) 97.6 F  (36.4 C) 97.8 F (36.6 C) 98.1 F (36.7 C)  TempSrc: Oral Oral Oral Oral  SpO2: 97% 97% 95% 99%  Weight:      Height:        Intake/Output Summary (Last 24 hours) at 06/06/2022 1337 Last data filed at 06/06/2022 1300 Gross per 24 hour  Intake 960 ml  Output 2250 ml  Net -1290 ml   Filed Weights   06/01/22 0503 06/03/22 1000 06/04/22 0800  Weight: 102.4 kg 96.2 kg 95.9 kg    Data Reviewed: I have personally reviewed and interpreted daily labs, tele strips, imagings as discussed above. I reviewed all nursing notes, pharmacy notes, vitals, pertinent old records I have discussed plan of care as described above with RN and patient/family.  CBC: Recent Labs  Lab 06/02/22 0242 06/02/22 1542 06/03/22 0430 06/04/22 0459 06/05/22 0338 06/06/22 0428  WBC 3.5*  --  3.1* 3.4* 3.8* 3.1*  HGB 7.6* 8.4* 7.9* 8.1* 7.8* 7.3*  HCT 25.6* 28.1* 26.3* 27.7* 25.8* 24.7*  MCV 90.8  --  90.4 91.4 90.2 90.1  PLT 109*  --  93* 109* 104* 91*   Basic Metabolic Panel: Recent Labs  Lab 05/31/22 0500 06/01/22 0451 06/02/22 0242 06/03/22 0430 06/04/22 0459 06/05/22 0338 06/06/22  0428  NA 138 140 140 138 138 136 135  K 4.5 3.9 3.8 3.6 4.5 4.2 4.2  CL 103 103 101 97* 96* 94* 94*  CO2 28 32 32 33* 34* 35* 34*  GLUCOSE 97 103* 100* 89 97 93 88  BUN 46* 45* 44* 47* 47* 52* 53*  CREATININE 2.26* 2.15* 2.00* 1.90* 1.93* 2.03* 2.02*  CALCIUM 7.8* 8.0* 7.6* 7.7* 7.8* 7.6* 7.5*  MG 2.4 2.4 2.2 2.3  --   --  2.2  PHOS 4.6 4.1 3.4 4.0  --   --   --     Studies: No results found.  Scheduled Meds:  sodium chloride   Intravenous Once   amiodarone  200 mg Oral Daily   apixaban  2.5 mg Oral BID   atorvastatin  40 mg Oral QPM   bisacodyl  5 mg Oral QHS   carvedilol  3.125 mg Oral BID   furosemide  40 mg Intravenous BID   hydrocerin   Topical BID   insulin aspart  0-15 Units Subcutaneous TID WC   isosorbide mononitrate  30 mg Oral Daily   pantoprazole (PROTONIX) IV  40 mg Intravenous Q12H    polyethylene glycol  17 g Oral Daily   spironolactone  12.5 mg Oral Daily   Vitamin D (Ergocalciferol)  50,000 Units Oral Q7 days   Continuous Infusions:   PRN Meds: acetaminophen, benzonatate, diclofenac Sodium, ondansetron (ZOFRAN) IV, mouth rinse  Time spent: 50 minutes  Author: Val Riles. MD [facility] 06/06/2022 1:37 PM  To reach On-call, see care teams to locate the attending and reach out to them via [email or web address]. If 7PM-7AM, please contact night-coverage If you still have difficulty reaching the attending provider, please page the [facility] (Director on Call) for [facility] on amion for assistance.

## 2022-06-06 NOTE — Progress Notes (Signed)
Occupational Therapy Treatment Patient Details Name: Randall Vincent MRN: [id number] DOB: 28-Oct-1940 Today's Date: 06/06/2022   History of present illness Patient is an 82 year old male who presents to ED for SOB. Patient has had gradually worsening LE swelling that reached to his abdomen. 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   OT comments  Pt seen for OT tx session this date.  Pt reported sitting up in the chair since around 730 this morning and was eager to move around.  Pt participated in functional mobility in hall, working to increase activity tolerance for ADLs and mobility.  Pt ambulated the 123ft nurse's station hallway loop with supv and 4-5 standing rest breaks.  OT increased 02 from 3L to 4L/min to maintain 02 sats in the low 90s with activity.  Intermittent cues for PLB and pt did well to initiate his own standing rest breaks.  Pt able to have BM on BSC and participated in peri care in standing with set up, 1 hand on walker for support.  OT assisted pt to bed end of session per his request with set up of lunch tray.  Pt will continue to benefit from skilled OT in the acute setting to work towards OT goals in [place].     Recommendations for follow up therapy are one component of a multi-disciplinary discharge planning process, led by the attending physician.  Recommendations may be updated based on patient status, additional functional criteria and insurance authorization.    Follow Up Recommendations  No OT follow up     Assistance Recommended at Discharge Intermittent Supervision/Assistance  Patient can return home with the following  Assistance with cooking/housework;Assist for transportation;Help with stairs or ramp for entrance;A little help with walking and/or transfers;A little help with bathing/dressing/bathroom   Equipment Recommendations  Other (comment) (sock aid)    Recommendations for Other Services       Precautions / Restrictions Precautions Precautions: Fall Restrictions Weight Bearing Restrictions: No       Mobility Bed Mobility Overal bed mobility: Needs Assistance Bed Mobility: Sit to Supine       Sit to supine: Supervision   General bed mobility comments: min A to scoot toward HOB and OT assist to lower HOB while scooting Patient Response: Cooperative  Transfers Overall transfer level: Needs assistance Equipment used: Rolling walker (2 wheels) Transfers: Sit to/from Stand Sit to Stand: Supervision           General transfer comment: Occasional rocking for momentum.  Requires BUE support on arm rests to help push up from seat.     Balance Overall balance assessment: Needs assistance Sitting-balance support: No upper extremity supported, Feet supported Sitting balance-Leahy Scale: Good Sitting balance - Comments: able to sit and reach without LOB   Standing balance support: Single extremity supported, During functional activity, Bilateral upper extremity supported Standing balance-Leahy Scale: Fair Standing balance comment: Performs hand hygiene standing at sink with supervision.  Performs peri care in standing wiht 1 hand on walker for support.                           ADL either performed or assessed with clinical judgement   ADL Overall ADL's : Needs assistance/impaired     Grooming: Wash/dry hands;Supervision/safety;Set up;Standing                   Toilet Transfer:  Supervision/safety;BSC/3in1 [employer] forces technical [employer] Details (indicate cue type and reason): 3in1 at bedside Toileting- Clothing Manipulation and Hygiene: Supervision/safety;Sit to/from stand Toileting - Clothing Manipulation Details (indicate cue type and reason): peri care in standing with set up and close SBA, 1 hand on walker for support.  Able to have BM.     Functional mobility during ADLs: Supervision/safety;Rolling walker (2 wheels) General ADL Comments: Intermittent vc  for PLB.  Pt initiated standing rest breaks as needed.    Extremity/Trunk Assessment Upper Extremity Assessment Upper Extremity Assessment: Generalized weakness   Lower Extremity Assessment Lower Extremity Assessment: Generalized weakness   Cervical / Trunk Assessment Cervical / Trunk Assessment: Normal    Vision Baseline Vision/History: 1 Wears glasses Patient Visual Report: No change from baseline     Perception     Praxis      Cognition Arousal/Alertness: Awake/alert Behavior During Therapy: WFL for tasks assessed/performed Overall Cognitive Status: Within Functional Limits for tasks assessed                                 General Comments: A&Ox4        Exercises Other Exercises Other Exercises: Reinforced PLB throughout functional mobility.    Shoulder Instructions       General Comments Pt on 3L/min via nasal cannula at start of session, resting at 99%.  Increased 02 during functional mobility d/t desating to 86%.  Pt took multiple standing rest breaks during functional mobility while ambulating the hallway loop ~150 ft.  Pt was provided with cues for PLB, but remained <90%.  Once bumped up to 4L, pt able to maintain sats between  89 and 94% with activity.  Pt requested to leave 02 at 4L to eat meal in bed upon completion of session.  RN notified.    Pertinent Vitals/ Pain       Pain Assessment Pain Assessment: No/denies pain  Home Living                                          Prior Functioning/Environment              Frequency  Min 2X/week        Progress Toward Goals  OT Goals(current goals can now be found in the care plan section)  Progress towards OT goals: Progressing toward goals  Acute Rehab OT Goals Patient Stated Goal: return home OT Goal Formulation: With patient Time For Goal Achievement: 06/13/22 Potential to Achieve Goals: Good  Plan Frequency remains appropriate    Co-evaluation                  AM-PAC OT "6 Clicks" Daily Activity     Outcome Measure   Help from another person eating meals?: None Help from another person taking care of personal grooming?: None Help from another person toileting, which includes using toliet, bedpan, or urinal?: A Little Help from another person bathing (including washing, rinsing, drying)?: A Lot Help from another person to put on and taking off regular upper body clothing?: None Help from another person to put on and taking off regular lower body clothing?: A Lot 6 Click Score: 19    End of Session Equipment Utilized During Treatment: Gait belt;Rolling walker (2 wheels)  OT Visit Diagnosis: Unsteadiness on feet (R26.81);Muscle weakness (generalized) (M62.81)   Activity  Tolerance Patient tolerated treatment well   Patient Left in bed;with call bell/phone within reach;with bed alarm set   Nurse Communication Mobility status;Other (comment) (02 bumped from 3L to 4L during OT session to maintain sats in the low 90s.)        Time: 1517-6160 OT Time Calculation (min): 38 min  Charges: OT General Charges $OT Visit: 1 Visit OT Treatments $Self Care/Home Management : 38-52 mins  Leta Speller, MS, OTR/L   Darleene Cleaver 06/06/2022, 4:17 PM

## 2022-06-06 NOTE — Progress Notes (Addendum)
[place] NOTE       Patient ID: Randall Vincent MRN: [id number] DOB/AGE: 1940/09/03 82 y.o.  Admit date: 05/29/2022 Referring Physician Dr. Val Riles Primary Physician Grier Rocher, PA-C ([place])  Primary Cardiologist Dr. Clayborn Bigness Reason for Consultation AoCHF  HPI: Randall Vincent is a 52yoM with a PMH of CAD s/p CABG x2 1991 and BMS, paroxysmal AF s/p DCCV 2021 on Eliquis, HFpEF (LVEF 45-50%, mi-mod MR, mod TR 08/2021), HTN, DM2, CKD3, who presented to [facility] ED 05/29/22 with worsening shortness of breath and peripheral edema extending up to his thighs and abdomen. Cardiology is consulted on hospital day 2 for further assistance with his HF. Echo this admission revealed a worsening EF of 30-35%, g3DD, and mod reduced LV function, severely reduced RV function.  He diuresed well on a lasix infusion (neg 12L).   Interval History:  -no acute events, peripheral + abdominal swelling continues to improve -no chest pain or shortness of breath -remains on supplemental O2, still feels weak  Net IO Since Admission: -12,166.11 mL [06/06/22 0942]   Review of systems complete and found to be negative unless listed above     Past Medical History:  Diagnosis Date   A-fib ([place])    a.) CHA2DS2-VASc = 6 (age x 2, CHF, HTN, previous MI, T2DM). b.) Rate/rhythm maintained on amiodarone + diltiazem + carvedilol; chronically anticoagulated using dabigatran. c.) DCCV 07/10/2015, 08/08/2019, 10/22/2019.   Arthritis    Chest pain 06/26/2019   CHF (congestive heart failure) (HCC)    CKD (chronic kidney disease), stage III ([place])    Coronary artery disease 1991   a.) 2v CABG in 1991. b.) PCI 06/07/2006 --> Vision stents to native LCx and RCA   Hyperlipidemia    Hypertension    Long term current use of anticoagulant    a.) dabigatran   Myocardial infarction ([place]) 1991   a.) LHC --> 50% and 90% LAD lesions; referred to CVTS. b.) ultimately underwent 2v CABG (LIMA-LAD,  SVG-RCA)   OSA on CPAP    S/P CABG x 2 01/02/1990   a.) LIMA-LAD, SVG-RCA   T2DM (type 2 diabetes mellitus) ([place])     Past Surgical History:  Procedure Laterality Date   BACK SURGERY  1970   L5-L6 ruptured disk   BLADDER SURGERY  2000   CARDIOVERSION N/A 07/10/2015   CARDIOVERSION N/A 08/08/2019   CARDIOVERSION N/A 10/22/2019   COLON SURGERY     COLONOSCOPY N/A 01/15/2020   Procedure: COLONOSCOPY;  Surgeon: Virgel Manifold, MD;  Location: [facility] ENDOSCOPY;  Service: Endoscopy;  Laterality: N/A;   COLONOSCOPY N/A 01/16/2020   Procedure: COLONOSCOPY;  Surgeon: Virgel Manifold, MD;  Location: [facility] ENDOSCOPY;  Service: Endoscopy;  Laterality: N/A;   CORONARY ARTERY BYPASS GRAFT N/A 01/02/1990   Procedure: 2v CORONARY ARTERY BYPASS GRAFT (LIMA-LAD, SVG-RCA)   ESOPHAGOGASTRODUODENOSCOPY N/A 01/13/2020   Procedure: ESOPHAGOGASTRODUODENOSCOPY (EGD);  Surgeon: Virgel Manifold, MD;  Location: [facility] ENDOSCOPY;  Service: Endoscopy;  Laterality: N/A;   REPLACEMENT TOTAL KNEE BILATERAL Bilateral 2008   STENT PLACEMENT VASCULAR ([place] HX)  2008   TOTAL KNEE REVISION Left 01/16/2020   Procedure: TOTAL KNEE REVISION;  Surgeon: Lovell Sheehan, MD;  Location: [facility] ORS;  Service: Orthopedics;  Laterality: Left;   TOTAL KNEE REVISION Left 05/31/2021   Procedure: TOTAL KNEE REVISION;  Surgeon: Lovell Sheehan, MD;  Location: [facility] ORS;  Service: Orthopedics;  Laterality: Left;    Medications Prior to Admission  Medication Sig Dispense Refill  Last Dose   amiodarone (PACERONE) 200 MG tablet Take 200 mg by mouth daily.   05/29/2022   apixaban (ELIQUIS) 2.5 MG TABS tablet Take 2.5 mg by mouth 2 (two) times daily.   05/29/2022   atorvastatin (LIPITOR) 40 MG tablet Take 40 mg by mouth every evening.    05/28/2022   carvedilol (COREG) 12.5 MG tablet Take 12.5 mg by mouth 2 (two) times daily.   05/29/2022   PROAIR RESPICLICK 108 (90 Base) MCG/ACT AEPB Inhale into the lungs.   prn   ramipril (ALTACE) 10 MG  capsule Take 1 capsule (10 mg total) by mouth daily. 30 capsule 1 05/29/2022   Semaglutide, 1 MG/DOSE, 2 MG/1.5ML SOPN Inject 1 mg into the skin every Thursday.   Past Month   torsemide (DEMADEX) 20 MG tablet Take 1 tablet (20 mg total) by mouth 2 (two) times daily. (Patient taking differently: Take 10-20 mg by mouth See admin instructions. Take 20 mg by mouth in the morning and 10 mg in the evening) 60 tablet 1 05/29/2022   vitamin B-12 (CYANOCOBALAMIN) 1000 MCG tablet Take 1,000 mcg by mouth daily.   05/29/2022   apixaban (ELIQUIS) 5 MG TABS tablet Take 1 tablet (5 mg total) by mouth 2 (two) times daily. (Patient not taking: Reported on 05/29/2022) 60 tablet 3 Not Taking   carvedilol (COREG) 3.125 MG tablet Take 1 tablet (3.125 mg total) by mouth 2 (two) times daily with a meal. (Patient not taking: Reported on 05/29/2022) 60 tablet 1 Not Taking   colchicine 0.6 MG tablet Take 0.6 mg by mouth 2 (two) times daily. (Patient not taking: Reported on 05/05/2022)      diltiazem (CARDIZEM CD) 240 MG 24 hr capsule Take 240 mg by mouth daily. (Patient not taking: Reported on 05/29/2022)   Not Taking   metFORMIN (GLUCOPHAGE-XR) 500 MG 24 hr tablet Take 500 mg by mouth 2 (two) times daily. (Patient not taking: Reported on 05/05/2022)       Social History   Socioeconomic History   Marital status: Widowed    Spouse name: Not on file   Number of children: Not on file   Years of education: Not on file   Highest education level: Not on file  Occupational History   Not on file  Tobacco Use   Smoking status: Former    Types: Cigarettes    Quit date: 36    Years since quitting: 33.0   Smokeless tobacco: Never  Vaping Use   Vaping Use: Never used  Substance and Sexual Activity   Alcohol use: Not Currently    Alcohol/week: 1.0 standard drink of alcohol    Types: 1 Cans of beer per week    Comment: very rarely   Drug use: Never   Sexual activity: Not on file  Other Topics Concern   Not on file  Social  History Narrative   Not on file   Social Determinants of Health   Financial Resource Strain: Not on file  Food Insecurity: No Food Insecurity (05/30/2022)   Hunger Vital Sign    Worried About Running Out of Food in the Last Year: Never true    Ran Out of Food in the Last Year: Never true  Transportation Needs: No Transportation Needs (05/30/2022)   PRAPARE - Administrator, Civil Service (Medical): No    Lack of Transportation (Non-Medical): No  Physical Activity: Not on file  Stress: Not on file  Social Connections: Not on file  Intimate  Partner Violence: Not At Risk (05/30/2022)   Humiliation, Afraid, Rape, and Kick questionnaire    Fear of Current or Ex-Partner: No    Emotionally Abused: No    Physically Abused: No    Sexually Abused: No    History reviewed. No pertinent family history.    Intake/Output Summary (Last 24 hours) at 06/06/2022 0942 Last data filed at 06/06/2022 0900 Gross per 24 hour  Intake 960 ml  Output 2250 ml  Net -1290 ml     Vitals:   06/06/22 0007 06/06/22 0417 06/06/22 0508 06/06/22 0803  BP: 118/68 (!) 104/48 (!) 111/53 (!) 118/53  Pulse: 86 70 67 82  Resp: 20 (!) 24 18 (!) 24  Temp: 97.9 F (36.6 C) 97.7 F (36.5 C) 97.6 F (36.4 C) 97.8 F (36.6 C)  TempSrc: Oral Oral Oral Oral  SpO2: 91% 97% 97% 95%  Weight:      Height:        PHYSICAL EXAM General: elderly and ill appearing caucasian male , well nourished, in no acute distress. Sitting upright in recliner PCU HEENT:  Normocephalic and atraumatic. Neck:  No JVD.  Lungs: slight conversational dyspnea on O2 by [place]. Decreased breath sounds right base Heart: HRRR . Normal S1 and S2 without gallops or murmurs.  Abdomen: mildly distended appearing with improving swelling along pannus Msk: Normal strength and tone for age. Extremities: chronic venous stasis hyperpigmentation with woody appearance. Trace RLE edema.  Neuro: Alert and oriented X 3. Psych:  Answers questions  appropriately.   Labs: Basic Metabolic Panel: Recent Labs    06/05/22 0338 06/06/22 0428  NA 136 135  K 4.2 4.2  CL 94* 94*  CO2 35* 34*  GLUCOSE 93 88  BUN 52* 53*  CREATININE 2.03* 2.02*  CALCIUM 7.6* 7.5*  MG  --  2.2    Liver Function Tests: No results for input(s): "AST", "ALT", "ALKPHOS", "BILITOT", "PROT", "ALBUMIN" in the last 72 hours.  No results for input(s): "LIPASE", "AMYLASE" in the last 72 hours. CBC: Recent Labs    06/05/22 0338 06/06/22 0428  WBC 3.8* 3.1*  HGB 7.8* 7.3*  HCT 25.8* 24.7*  MCV 90.2 90.1  PLT 104* 91*    Cardiac Enzymes: No results for input(s): "CKTOTAL", "CKMB", "CKMBINDEX", "TROPONINIHS" in the last 72 hours.  BNP: No results for input(s): "BNP" in the last 72 hours.  D-Dimer: No results for input(s): "DDIMER" in the last 72 hours. Hemoglobin A1C: No results for input(s): "HGBA1C" in the last 72 hours. Fasting Lipid Panel: No results for input(s): "CHOL", "HDL", "LDLCALC", "TRIG", "CHOLHDL", "LDLDIRECT" in the last 72 hours. Thyroid Function Tests: No results for input(s): "TSH", "T4TOTAL", "T3FREE", "THYROIDAB" in the last 72 hours.  Invalid input(s): "FREET3" Anemia Panel: No results for input(s): "VITAMINB12", "FOLATE", "FERRITIN", "TIBC", "IRON", "RETICCTPCT" in the last 72 hours.    Radiology: ECHOCARDIOGRAM COMPLETE  Result Date: 05/30/2022    ECHOCARDIOGRAM REPORT   Patient Name:   Hanley HaysGEORGE Charnley Date of Exam: 05/30/2022 Medical Rec #:  [id number]     Height:       68.0 in Accession #:    [id number]    Weight:       231.1 lb Date of Birth:  12/24/1940      BSA:          2.173 m Patient Age:    81 years      BP:           114/55 mmHg Patient Gender: M  HR:           53 bpm. Exam Location:  [facility] Procedure: 2D Echo, Cardiac Doppler and Color Doppler Indications:    CHF- Acute Systolic N82.95  History:        Patient has prior history of Echocardiogram examinations, most                 recent 09/18/2021. CHF, CAD and  Previous Myocardial Infarction,                 Prior CABG, Arrythmias:Atrial Fibrillation, Signs/Symptoms:Chest                 Pain; Risk Factors:Dyslipidemia, Hypertension, Sleep Apnea and                 Diabetes. CKD, stage III.  Sonographer:    Ronny Flurry Referring Phys: [id number] IULIA BASARABA IMPRESSIONS  1. Left ventricular ejection fraction, by estimation, is 30 to 35%. The left ventricle has moderately decreased function. Left ventricular endocardial border not optimally defined to evaluate regional wall motion. The left ventricular internal cavity size was mildly dilated. There is moderate left ventricular hypertrophy. Left ventricular diastolic parameters are consistent with Grade III diastolic dysfunction (restrictive).  2. Right ventricular systolic function is severely reduced. The right ventricular size is mildly enlarged. There is moderately elevated pulmonary artery systolic pressure.  3. Left atrial size was moderately dilated.  4. Right atrial size was moderately dilated.  5. The mitral valve is abnormal. Moderate mitral valve regurgitation. No evidence of mitral stenosis. Moderate mitral annular calcification.  6. Tricuspid valve regurgitation is moderate to severe.  7. The aortic valve is normal in structure. Aortic valve regurgitation is not visualized. Aortic valve sclerosis is present, with no evidence of aortic valve stenosis.  8. Moderately dilated pulmonary artery.  9. The inferior vena cava is dilated in size with <50% respiratory variability, suggesting right atrial pressure of 15 mmHg. FINDINGS  Left Ventricle: Left ventricular ejection fraction, by estimation, is 30 to 35%. The left ventricle has moderately decreased function. Left ventricular endocardial border not optimally defined to evaluate regional wall motion. The left ventricular internal cavity size was mildly dilated. There is moderate left ventricular hypertrophy. Abnormal (paradoxical) septal motion consistent with  post-operative status. Left ventricular diastolic parameters are consistent with Grade III diastolic dysfunction  (restrictive). Right Ventricle: The right ventricular size is mildly enlarged. No increase in right ventricular wall thickness. Right ventricular systolic function is severely reduced. There is moderately elevated pulmonary artery systolic pressure. The tricuspid regurgitant velocity is 2.92 m/s, and with an assumed right atrial pressure of 15 mmHg, the estimated right ventricular systolic pressure is 57.8 mmHg. Left Atrium: Left atrial size was moderately dilated. Right Atrium: Right atrial size was moderately dilated. Pericardium: There is no evidence of pericardial effusion. Mitral Valve: The mitral valve is abnormal. There is mild thickening of the mitral valve leaflet(s). Moderate mitral annular calcification. Moderate mitral valve regurgitation. No evidence of mitral valve stenosis. Tricuspid Valve: The tricuspid valve is normal in structure. Tricuspid valve regurgitation is moderate to severe. No evidence of tricuspid stenosis. Aortic Valve: The aortic valve is normal in structure. Aortic valve regurgitation is not visualized. Aortic valve sclerosis is present, with no evidence of aortic valve stenosis. Aortic valve mean gradient measures 3.5 mmHg. Aortic valve peak gradient measures 6.5 mmHg. Aortic valve area, by VTI measures 3.06 cm. Pulmonic Valve: The pulmonic valve was normal in structure. Pulmonic valve regurgitation is mild to moderate.  No evidence of pulmonic stenosis. Aorta: The aortic root is normal in size and structure. Pulmonary Artery: The pulmonary artery is moderately dilated. Venous: The inferior vena cava is dilated in size with less than 50% respiratory variability, suggesting right atrial pressure of 15 mmHg. IAS/Shunts: No atrial level shunt detected by color flow Doppler.  LEFT VENTRICLE PLAX 2D LVIDd:         5.50 cm   Diastology LVIDs:         4.60 cm   LV e' medial:     5.43 cm/s LV PW:         1.40 cm   LV E/e' medial:  22.1 LV IVS:        1.40 cm   LV e' lateral:   6.97 cm/s LVOT diam:     2.20 cm   LV E/e' lateral: 17.2 LV SV:         86 LV SV Index:   40 LVOT Area:     3.80 cm  RIGHT VENTRICLE            IVC RV S prime:     3.88 cm/s  IVC diam: 2.50 cm TAPSE (M-mode): 1.0 cm LEFT ATRIUM           Index        RIGHT ATRIUM           Index LA diam:      4.80 cm 2.21 cm/m   RA Area:     27.70 cm LA Vol (A2C): 55.9 ml 25.72 ml/m  RA Volume:   99.70 ml  45.88 ml/m LA Vol (A4C): 71.9 ml 33.08 ml/m  AORTIC VALVE                    PULMONIC VALVE AV Area (Vmax):    3.04 cm     PR End Diast Vel: 10.24 msec AV Area (Vmean):   3.04 cm AV Area (VTI):     3.06 cm AV Vmax:           127.50 cm/s AV Vmean:          84.500 cm/s AV VTI:            0.281 m AV Peak Grad:      6.5 mmHg AV Mean Grad:      3.5 mmHg LVOT Vmax:         102.00 cm/s LVOT Vmean:        67.600 cm/s LVOT VTI:          0.226 m LVOT/AV VTI ratio: 0.80  AORTA Ao Root diam: 3.30 cm Ao Asc diam:  3.50 cm MITRAL VALVE                TRICUSPID VALVE MV Area (PHT): 4.06 cm     TR Peak grad:   34.1 mmHg MV Decel Time: 187 msec     TR Vmax:        292.00 cm/s MR Peak grad: 66.3 mmHg MR Vmax:      407.00 cm/s   SHUNTS MV E velocity: 120.00 cm/s  Systemic VTI:  0.23 m MV A velocity: 47.40 cm/s   Systemic Diam: 2.20 cm MV E/A ratio:  2.53 Lorine BearsMuhammad Arida MD Electronically signed by Lorine BearsMuhammad Arida MD Signature Date/Time: 05/30/2022/4:25:38 PM    Final    US ARTERIAL ABI (SCREENING LOWER EXTREMITY)  Result Date: 05/30/2022 CLINICAL DATA:  82 year old male with a history of bilateral lower extremity edema  EXAM: NONINVASIVE PHYSIOLOGIC VASCULAR STUDY OF BILATERAL LOWER EXTREMITIES TECHNIQUE: Evaluation of both lower extremities was performed at rest, including calculation of ankle-brachial indices, multiple segmental pressure evaluation, segmental Doppler and segmental pulse volume recording. COMPARISON:  None Available. FINDINGS:  Right ABI:  1.47 Left ABI:  1.55 Right Lower Extremity: Segmental Doppler at the right ankle demonstrates monophasic waveforms Left Lower Extremity: Segmental Doppler at the left ankle demonstrates triphasic posterior tibial and dorsalis pedis waveform. IMPRESSION: Right: Resting ABI is likely falsely elevated secondary to noncompressible vessels, given the segmental Doppler. Segmental Doppler demonstrates monophasic waveforms at the right ankle, indicating more proximal occlusive disease. Left: Resting ABI likely falsely elevated secondary to noncompressible vessels. Segmental Doppler at the left ankle demonstrates waveforms relatively maintained. Signed, Yvone Neu. Miachel Roux, RPVI [facility] Radiology Electronically Signed   By: Gilmer Mor D.O.   On: 05/30/2022 10:56   DG Chest 2 View  Result Date: 05/29/2022 CLINICAL DATA:  Shortness of breath and ascites.  History of CHF. EXAM: CHEST - 2 VIEW COMPARISON:  09/16/2021 FINDINGS: Cardiac enlargement. Aortic atherosclerosis. Previous median sternotomy and CABG procedure. Bilateral pleural effusions, right greater than left. Mild interstitial edema. Atelectasis noted in both lung bases. No airspace opacities. IMPRESSION: 1. Mild congestive heart failure. 2. Bibasilar atelectasis. Electronically Signed   By: Signa Kell M.D.   On: 05/29/2022 10:27    ECHO 05/30/2022 1. Left ventricular ejection fraction, by estimation, is 30 to 35%. The  left ventricle has moderately decreased function. Left ventricular  endocardial border not optimally defined to evaluate regional wall motion.  The left ventricular internal cavity  size was mildly dilated. There is moderate left ventricular hypertrophy.  Left ventricular diastolic parameters are consistent with Grade III  diastolic dysfunction (restrictive).   2. Right ventricular systolic function is severely reduced. The right  ventricular size is mildly  enlarged. There is moderately elevated  pulmonary artery systolic pressure.   3. Left atrial size was moderately dilated.   4. Right atrial size was moderately dilated.   5. The mitral valve is abnormal. Moderate mitral valve regurgitation. No  evidence of mitral stenosis. Moderate mitral annular calcification.   6. Tricuspid valve regurgitation is moderate to severe.   7. The aortic valve is normal in structure. Aortic valve regurgitation is  not visualized. Aortic valve sclerosis is present, with no evidence of  aortic valve stenosis.   8. Moderately dilated pulmonary artery.   9. The inferior vena cava is dilated in size with <50% respiratory  variability, suggesting right atrial pressure of 15 mmHg.   TELEMETRY reviewed by me (LT) 06/06/2022 : NSR rate 70s  EKG reviewed by me: NSR bifacicular block   Data reviewed by me (LT) 06/06/2022: hospitalist progress note, cbc, bmp, cxr, vitals, tele, I/o   Principal Problem:   Acute on chronic heart failure (HCC) Active Problems:   Atrial fibrillation status post cardioversion 10/22/19 [facility])   Essential hypertension   DM (diabetes mellitus), type 2 (HCC)   Stage 3b chronic kidney disease (CKD) (HCC)   Venous stasis dermatitis of both lower extremities    ASSESSMENT AND PLAN:  Mohamedamin Nifong is a 20yoM with a PMH of CAD s/p CABG x2 1991 and BMS, paroxysmal AF s/p DCCV 2021 on Eliquis, HFpEF (LVEF 45-50%, mi-mod MR, mod TR 08/2021), HTN, DM2, CKD3, who presented to [facility] ED 05/29/22 with worsening shortness of breath and peripheral edema extending up to his thighs and abdomen. Cardiology is consulted  on hospital day 2 for further assistance with his HF. Echo this admission revealed a worsening EF of 30-35%, g3DD, and mod reduced LV function, severely reduced RV function  # Acute on chronic HFrEF (30-35%, G3 DD 05/2022) Presents with several days of worsening shortness of breath, abdominal distention, and peripheral edema, refractory to an extra dose  of torsemide 20 mg the day prior to admission.  Admits to eating primarily frozen dinners and canned soup since his family who he usually eats with has been sick and they have not been eating together for the past 2 to 3 weeks.  BNP is elevated to 1349 on admission and is clinically hypervolemic with pitting edema from his feet up to his lower abdomen/flanks.  Clinical improvement after IV Lasix, now on a Lasix infusion with  Net IO Since Admission: -12,166.11 mL [06/06/22 0942]  -S/p IV Lasix 60 mg x 1, 40 mg x 2 -s/p Lasix infusion 8 mg/h (Home diuretic torsemide 20 mg twice daily) -continue lasix IV 40mg  x 3 more doses. Likely restart home torsemide following this. -s/p metolazone 2.5mg  PO x1 1/10 and 1/12 -Continue GDMT with Coreg 3.125 mg twice daily, spironolactone 12.5 mg once daily, imdur 30mg  daily -hold hydral with hypotension  -Hold ramipril 10 mg with diuresis.  Consider change to ARNI, SGLT2i pending clinical course as BP and renal function allows -Strict I's/O, discussed salt and fluid restriction at length -anticipate 2-3 more days of diuresis  -encouraged mobility + participation with PT  # AKI on CKD 3 Cr trend with diuresis 2.18-2.37-2.26--2.15--2.00-- 1.90, current GFR 35, CR ranged from the past 6 months from 1.8-2.44 per review of his outpatient labs.  Monitor closely with diuresis  # Paroxysmal AF s/p DCCV 2021 Currently in sinus rhythm on telemetry, rate controlled on carvedilol 3.125 mg twice daily, rhythm controlled on amiodarone 200 mg once daily. -On dose reduced Eliquis 2.5 mg twice daily due to his age >38, and creatinine >1.5  -  CHA2DS2-VASc 6 (age, CHF, HTN, CAD, DM2)   # CAD s/p CABG x 2 (1991) # Demand ischemia Currently denies chest pain, troponins on admission barely elevated at 22, 23, which is most consistent with demand/supply mismatch and not ACS.   # anemia of chronic disease / IDA S/p IV iron x 3, Hgb continues to drift. Management per primary team    This patient's plan of care was discussed and created with Dr. >83 and he is in agreement.  Signed: 04-25-1999 , PA-C 06/06/2022, 9:42 AM [facility] Cardiology

## 2022-06-06 NOTE — Progress Notes (Signed)
PT Cancellation Note  Patient Details Name: Randall Vincent MRN: [id number] DOB: 11/02/1940   Cancelled Treatment:     Pt had just finished mobilizing with OT and returned to bed. Will continue per POC next available date/time.   Josie Dixon 06/06/2022, 4:50 PM

## 2022-06-06 NOTE — Progress Notes (Signed)
PHARMACIST - PHYSICIAN COMMUNICATION  DR:   Dwyane Dee  CONCERNING: IV to Oral Route Change Policy  RECOMMENDATION: This patient is receiving pantoprazole by the intravenous route.  Based on criteria approved by the Pharmacy and Therapeutics Committee, the intravenous medication(s) is/are being converted to the equivalent oral dose form(s).   DESCRIPTION: These criteria include: The patient is eating (either orally or via tube) and/or has been taking other orally administered medications for a least 24 hours The patient has no evidence of active gastrointestinal bleeding or impaired GI absorption (gastrectomy, short bowel, patient on TNA or NPO).  If you have questions about this conversion, please contact the [place] Rodriguez-Guzman PharmD, BCPS 06/06/2022 2:54 PM

## 2022-06-07 ENCOUNTER — Inpatient Hospital Stay: Payer: Medicare Other

## 2022-06-07 DIAGNOSIS — Z9889 Other specified postprocedural states: Secondary | ICD-10-CM

## 2022-06-07 DIAGNOSIS — I5023 Acute on chronic systolic (congestive) heart failure: Secondary | ICD-10-CM | POA: Diagnosis not present

## 2022-06-07 DIAGNOSIS — I509 Heart failure, unspecified: Secondary | ICD-10-CM

## 2022-06-07 LAB — CBC
HCT: 28.4 % — ABNORMAL LOW (ref 39.0–52.0)
Hemoglobin: 8.4 g/dL — ABNORMAL LOW (ref 13.0–17.0)
MCH: 26.7 pg (ref 26.0–34.0)
MCHC: 29.6 g/dL — ABNORMAL LOW (ref 30.0–36.0)
MCV: 90.2 fL (ref 80.0–100.0)
Platelets: 92 10*3/uL — ABNORMAL LOW (ref 150–400)
RBC: 3.15 MIL/uL — ABNORMAL LOW (ref 4.22–5.81)
RDW: 17.6 % — ABNORMAL HIGH (ref 11.5–15.5)
WBC: 3.2 10*3/uL — ABNORMAL LOW (ref 4.0–10.5)
nRBC: 0 % (ref 0.0–0.2)

## 2022-06-07 LAB — BASIC METABOLIC PANEL
Anion gap: 9 (ref 5–15)
BUN: 57 mg/dL — ABNORMAL HIGH (ref 8–23)
CO2: 34 mmol/L — ABNORMAL HIGH (ref 22–32)
Calcium: 7.7 mg/dL — ABNORMAL LOW (ref 8.9–10.3)
Chloride: 93 mmol/L — ABNORMAL LOW (ref 98–111)
Creatinine, Ser: 1.85 mg/dL — ABNORMAL HIGH (ref 0.61–1.24)
GFR, Estimated: 36 mL/min — ABNORMAL LOW (ref >60)
Glucose, Bld: 85 mg/dL (ref 70–99)
Potassium: 4.2 mmol/L (ref 3.5–5.1)
Sodium: 136 mmol/L (ref 135–145)

## 2022-06-07 LAB — BODY FLUID CELL COUNT WITH DIFFERENTIAL
Lymphs, Fluid: 33 %
Monocyte-Macrophage-Serous Fluid: 64 % (ref 50–90)
Neutrophil Count, Fluid: 3 % (ref 0–25)
Total Nucleated Cell Count, Fluid: 169 cu mm (ref 0–1000)

## 2022-06-07 LAB — GLUCOSE, CAPILLARY
Glucose-Capillary: 77 mg/dL (ref 70–99)
Glucose-Capillary: 110 mg/dL — ABNORMAL HIGH (ref 70–99)
Glucose-Capillary: 113 mg/dL — ABNORMAL HIGH (ref 70–99)
Glucose-Capillary: 140 mg/dL — ABNORMAL HIGH (ref 70–99)

## 2022-06-07 LAB — LACTATE DEHYDROGENASE, PLEURAL OR PERITONEAL FLUID: LD, Fluid: 40 U/L — ABNORMAL HIGH (ref 3–23)

## 2022-06-07 LAB — PROTEIN, PLEURAL OR PERITONEAL FLUID: Total protein, fluid: <3 g/dL

## 2022-06-07 LAB — PHOSPHORUS: Phosphorus: 3.3 mg/dL (ref 2.5–4.6)

## 2022-06-07 LAB — MAGNESIUM: Magnesium: 2.2 mg/dL (ref 1.7–2.4)

## 2022-06-07 LAB — BRAIN NATRIURETIC PEPTIDE: B Natriuretic Peptide: 1027.2 pg/mL — ABNORMAL HIGH (ref 0.0–100.0)

## 2022-06-07 MED ORDER — FUROSEMIDE 10 MG/ML IJ SOLN
60.0000 mg | Freq: Two times a day (BID) | INTRAMUSCULAR | Status: DC
  Filled 2022-06-07: qty 6

## 2022-06-07 MED ORDER — GUAIFENESIN ER 600 MG PO TB12
600.0000 mg | ORAL_TABLET | Freq: Two times a day (BID) | ORAL | Status: DC
  Administered 2022-06-07 – 2022-06-11 (×9): 600 mg via ORAL
  Filled 2022-06-07 (×9): qty 1

## 2022-06-07 MED ORDER — FUROSEMIDE 10 MG/ML IJ SOLN: 60.0000 mg | Freq: Two times a day (BID) | INTRAMUSCULAR | Status: DC

## 2022-06-07 MED ORDER — FUROSEMIDE 10 MG/ML IJ SOLN
5.0000 mg/h | INTRAVENOUS | Status: DC
  Administered 2022-06-07 – 2022-06-08 (×2): 5 mg/h via INTRAVENOUS
  Filled 2022-06-07 (×3): qty 20

## 2022-06-07 MED ORDER — TORSEMIDE 20 MG PO TABS
20.0000 mg | ORAL_TABLET | Freq: Two times a day (BID) | ORAL | Status: DC
  Administered 2022-06-07: 20 mg via ORAL
  Filled 2022-06-07: qty 1

## 2022-06-07 MED ORDER — LIDOCAINE HCL (PF) 1 % IJ SOLN
10.0000 mL | Freq: Once | INTRAMUSCULAR | Status: AC
  Administered 2022-06-07: 10 mL via INTRADERMAL

## 2022-06-07 NOTE — Plan of Care (Signed)
  Problem: Education: Goal: Ability to describe self-care measures that may prevent or decrease complications (Diabetes Survival Skills Education) will improve Outcome: Progressing Goal: Individualized Educational Video(s) Outcome: Progressing   Problem: Coping: Goal: Ability to adjust to condition or change in health will improve Outcome: Progressing   Problem: Fluid Volume: Goal: Ability to maintain a balanced intake and output will improve Outcome: Progressing   Problem: Health Behavior/Discharge Planning: Goal: Ability to identify and utilize available resources and services will improve Outcome: Progressing Goal: Ability to manage health-related needs will improve Outcome: Progressing   Problem: Metabolic: Goal: Ability to maintain appropriate glucose levels will improve Outcome: Progressing   Problem: Nutritional: Goal: Maintenance of adequate nutrition will improve Outcome: Progressing Goal: Progress toward achieving an optimal weight will improve Outcome: Progressing   Problem: Skin Integrity: Goal: Risk for impaired skin integrity will decrease Outcome: Progressing   Problem: Tissue Perfusion: Goal: Adequacy of tissue perfusion will improve Outcome: Progressing   Problem: Education: Goal: Knowledge of General Education information will improve Description: Including pain rating scale, medication(s)/side effects and non-pharmacologic comfort measures Outcome: Progressing   Problem: Health Behavior/Discharge Planning: Goal: Ability to manage health-related needs will improve Outcome: Progressing   

## 2022-06-07 NOTE — Care Management Important Message (Signed)
Important Message  Patient Details  Name: Randall Vincent MRN: [id number] Date of Birth: [birth year]   Medicare Important Message Given:  Yes     Dannette Barbara 06/07/2022, 11:52 AM

## 2022-06-07 NOTE — Progress Notes (Signed)
Physical Therapy Treatment Patient Details Name: Randall Vincent MRN: [id number] DOB: [birth year] Today's Date: 06/07/2022   History of Present Illness Patient is an 82 year old male who presents to ED for SOB. Patient has had gradually worsening LE swelling that reached to his abdomen. 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    PT Comments    On arrival pt finishing up using BSC.  He was eager to do some mobility/walking but ultimately was very limited and able to do less than most prior actual sessions.  He was on 3L O2 t/o the session but did not have O2 >93% (even at rest) and dropped to mid 80s during the minimal effort of in room ambulation. He was unable to help with clean up after BM and fatigued with even limited time in standing.  He stated he was wanting to try and do more but even after seated rest break, gentle LE exercises and focused breathing he did not have a lot of recovery confidence (O2 only barely back to 90% after a few minutes of recovery breathing in sitting).  He was wanting to try a longer walk but simply felt too fatigued and short of breath to attempt.  Pt still feeling confident that he can manage at home alone with help from son and d-in-law next door but does agree that he will need much more assist than he is typically in need of.    Recommendations for follow up therapy are one component of a multi-disciplinary discharge planning process, led by the attending physician.  Recommendations may be updated based on patient status, additional functional criteria and insurance authorization.  Follow Up Recommendations  Home health PT     Assistance Recommended at Discharge Set up Supervision/Assistance  Patient can return home with the following Assistance with cooking/housework;A little help with bathing/dressing/bathroom;Assist for transportation;Help with stairs or ramp for entrance;A little help with walking  and/or transfers   Equipment Recommendations  None recommended by PT    Recommendations for Other Services       Precautions / Restrictions Precautions Precautions: Fall Restrictions Weight Bearing Restrictions: No     Mobility  Bed Mobility               General bed mobility comments:  (seated pre and post session)    Transfers Overall transfer level: Needs assistance Equipment used: Rolling walker (2 wheels) Transfers: Sit to/from Stand Sit to Stand: Supervision           General transfer comment: Pt with need for UEs and insuring appropriate set up for sit to stand but able to do so multiple times this session w/o direct assist    Ambulation/Gait Ambulation/Gait assistance: Supervision Gait Distance (Feet): 20 Feet Assistive device: Rolling walker (2 wheels)         General Gait Details: Pt had 3L O2 on t/o the effort, seated at rest SpO2 in the low 90s, dropped to 84% during modest bout of ambulation with shortness of breath and subjective fatigue. Slow to get back to 90% with focused breathing.  Pt has previously been able to manage more prolonged bouts of ambulation this hospitalization - but today was very quick to fatigue and feel as though he could not do more despite much encouargement and cuing.   Stairs             Wheelchair Mobility    Modified Rankin (  Stroke Patients Only)       Balance Overall balance assessment: Needs assistance Sitting-balance support: No upper extremity supported, Feet supported Sitting balance-Leahy Scale: Good     Standing balance support: Bilateral upper extremity supported Standing balance-Leahy Scale: Fair Standing balance comment: Encourage pt to help participate with clean up post BM, "I usually can, but I don't think I have it today."  He also showed poor tolerance with prolonged standing with decreasing posture/increasing AD reliance with increased stance time                             Cognition Arousal/Alertness: Awake/alert Behavior During Therapy: WFL for tasks assessed/performed Overall Cognitive Status: Within Functional Limits for tasks assessed                                          Exercises General Exercises - Lower Extremity Long Arc Quad: AROM, 5 reps Heel Slides: AROM, 5 reps    General Comments General comments (skin integrity, edema, etc.): 3L O2 t/o session.  At rest 88-92%, dropped to 84% with limited ambulation and slow to return to 90s.      Pertinent Vitals/Pain Pain Assessment Pain Assessment: No/denies pain    Home Living                          Prior Function            PT Goals (current goals can now be found in the care plan section) Progress towards PT goals: Not progressing toward goals - comment    Frequency    Min 2X/week      PT Plan Discharge plan needs to be updated    Co-evaluation              AM-PAC PT "6 Clicks" Mobility   Outcome Measure  Help needed turning from your back to your side while in a flat bed without using bedrails?: None Help needed moving from lying on your back to sitting on the side of a flat bed without using bedrails?: A Little Help needed moving to and from a bed to a chair (including a wheelchair)?: A Little Help needed standing up from a chair using your arms (e.g., wheelchair or bedside chair)?: A Little Help needed to walk in hospital room?: A Little Help needed climbing 3-5 steps with a railing? : A Little 6 Click Score: 19    End of Session Equipment Utilized During Treatment: Oxygen Activity Tolerance: Patient tolerated treatment well Patient left: in chair;with chair alarm set;with call bell/phone within reach Nurse Communication: Mobility status PT Visit Diagnosis: Unsteadiness on feet (R26.81);Other abnormalities of gait and mobility (R26.89);Repeated falls (R29.6);Muscle weakness (generalized) (M62.81);History of falling  (Z91.81);Difficulty in walking, not elsewhere classified (R26.2)     Time: 1751-0258 PT Time Calculation (min) (ACUTE ONLY): 31 min  Charges:  $Gait Training: 8-22 mins $Therapeutic Activity: 8-22 mins                     Kreg Shropshire, DPT 06/07/2022, 1:13 PM

## 2022-06-07 NOTE — Progress Notes (Signed)
OT Cancellation Note  Patient Details Name: Randall Vincent MRN: [id number] DOB: 06/21/1940   Cancelled Treatment:    Reason Eval/Treat Not Completed: Patient at procedure or test/ unavailable. Chart reviewed, attempted OT session. Pt out of room for thoracentesis. Will re-attempt at a later date as pt is available and medically appropriate.   Josiah Lobo 06/07/2022, 4:02 PM

## 2022-06-07 NOTE — Procedures (Signed)
PROCEDURE SUMMARY:  Successful US guided diagnostic and therapeutic right thoracentesis. Yielded 1 L of clear, yellow fluid. Pt tolerated procedure well. No immediate complications.  Specimen was sent for labs. CXR ordered.  EBL < 1 mL  Tyson Alias, AGNP 06/07/2022 3:11 PM

## 2022-06-07 NOTE — Progress Notes (Signed)
[facility] Progress Note  Patient: Randall Vincent    SWN:[id number]  DOA: 05/29/2022     Date of Service: the patient was seen and examined on 06/07/2022  Chief Complaint  Patient presents with   Shortness of Breath   Brief hospital course: Randall Vincent is a 82 y.o. 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 ED w/up: Chest x-ray was obtained that demonstrated interstitial edema, mild, with bilateral pleural effusions. Due to failure of outpatient treatment, [facility] contacted for admission for acute on chronic heart failure.    Assessment and Plan:  # Acute on chronic systolic heart failure  Patient has a history of moderately reduced EF with last EF of 45-50%, follows with Dr. Juliann Vincent.  Despite increasing doses of home torsemide, patient presents with marked pitting edema extending up to the mid abdomen. - S/p Lasix 60 mg IV once and  s/p Lasix 40 mg IV BID - Strict in and out - Daily weights - Continue home GDMT TTE shows LVEF 30 to 35%, grade 3 diastolic dysfunction, right ventricular failure, moderate pulm hypertension, moderate MR and moderate to severe TR Cardiology consulted for further recommendation. Metolazone 2.5 mg p.o. x 1 dose on 1/10 and 1/12  ordered by cardiology 1/11 started spironolactone 12.5 mg p.o. daily as per cardiology On 1/8 started Lasix IV infusion due to anasarca 1/13 DC Lasix infusion, s/p Lasix IV 60 mg x 1 dose, 40 mg x 2 1/14 s/p Lasix 40 mg x BID, 3 more doses and then start torsemide home dose as per cardiology 1/13 Started Imdur 30 mg p.o. daily 1/16 CXR showed pleural effusion more on the right side, patient agreed for thoracentesis, follow IR for thoracentesis and fluid studies. 1/16 restarted Lasix IV  infusion   Anemia of chronic disease, iron deficiency and occult GI bleeding, unknown source FOBT positive but no overt GI bleeding Discussed with GI, patient is not stable for EGD at this time and no overt GI bleeding, patient refusing colonoscopy.  GI recommended to continue Eliquis for now given benefit overweigh the risk of bleeding Monitor H&H and transfuse if hemoglobin less than 8 1/15 Hb 7.3, transfuse 1 unit of PRBC 1/16 Hb 8.4, stable, platelet 92k Monitor H&H and transfuse if hemoglobin less than 8   Iron deficiency, iron level 16, transferrin saturation 7% so started Venofer 300 mg IV daily x 3 doses, start oral iron supplement on discharge.  B12 and folate within normal range.  Stage 3b chronic kidney disease (CKD) Patient's renal function frequently fluctuates between 1.6 and 2.2.   Currently 2.18, previously 1.64.  I wonder if there is a component of cardiorenal syndrome.  Will monitor closely while diuresing. - Daily BMP - Monitor urine output   Atrial fibrillation status post cardioversion 10/22/19  Currently rate controlled. Continue home amiodarone 200 POD, Eliquis 2.5 twice daily, and Coreg 3.125 twice daily   Venous stasis dermatitis of both lower extremities Per Randall Vincent most recent note, plans were to obtain ABIs.  Will do so at this time and if no evidence of PAD, plan to order Unna boots ABI:  Right ABI:  1.47 and Left ABI:  1.55 Right Lower Extremity: Segmental Doppler at the right ankle demonstrates monophasic waveforms Left Lower Extremity: Segmental Doppler at  the left ankle demonstrates triphasic posterior tibial and dorsalis pedis waveform. - pt may need Unna boots, will reassess after diuresis 1/15 apply Eucerin cream bilateral lower extremities due to dryness   Essential hypertension - Continue home antihypertensives   DM (diabetes mellitus), type 2 ([place]) Well-managed with semaglutide with last A1c of 5.9% 4 weeks ago. - Hold home  semaglutide while admitted - SSI, moderate   Vitamin D deficiency: started vitamin D 50,000 units p.o. weekly, follow with PCP to repeat vitamin D level after 3 to 6 months.  Body mass index is 35.14 kg/m.  Interventions:     Diet: Heart healthy diet, fluid striction 1.5 L/day DVT Prophylaxis: Therapeutic Anticoagulation with Eliquis    Advance goals of care discussion: DNR  Family Communication: family was not present at bedside, at the time of interview.  The pt provided permission to discuss medical plan with the family. Opportunity was given to ask question and all questions were answered satisfactorily.   Disposition:  Pt is from Home, admitted with volume overload on IV Lasix infusion, still has anasarca, which precludes a safe discharge. Discharge to home, when clinically stable, may need 2-3 days with IV diuresis.    Subjective: No significant overnight events, patient is complaining of shortness of breath, oxygen drops with limited activity on 3 L oxygen patient was satting 84% on ambulation. Patient agreed with thoracentesis, plan is to restart IV Lasix infusion. Patient denies any chest pain or palpitation, no GI bleeding.   Physical Exam: General: NAD, lying comfortably, mild SOB Appear in no distress, affect appropriate Eyes: Right eye PERRLA, (left eye blind since childhood) ENT: Oral Mucosa Clear, moist  Neck: no JVD,  Cardiovascular: S1 and S2 Present, no Murmur,  Respiratory: good respiratory effort, Bilateral Air entry equal and Decreased, mild bilateral crackles, no wheezes Abdomen: Bowel Sound present, Soft and no tenderness, edematous abdominal wall Skin: no rashes Extremities: mild Pedal edema, no calf tenderness, chronic venous stasis pigmentation Neurologic: without any new focal findings Gait not checked due to patient safety concerns  Vitals:   06/07/22 0448 06/07/22 0816 06/07/22 1039 06/07/22 1154  BP: 111/63 (!) 115/57  (!) 93/50  Pulse: 77 75   79  Resp: (!) 24 20  20   Temp: (!) 97.5 F (36.4 C) 97.7 F (36.5 C)  98.1 F (36.7 C)  TempSrc: Oral Oral  Oral  SpO2: 97% 96%  96%  Weight:   92.6 kg   Height:        Intake/Output Summary (Last 24 hours) at 06/07/2022 1422 Last data filed at 06/07/2022 1100 Gross per 24 hour  Intake 920.83 ml  Output 1200 ml  Net -279.17 ml   Filed Weights   06/03/22 1000 06/04/22 0800 06/07/22 1039  Weight: 96.2 kg 95.9 kg 92.6 kg    Data Reviewed: I have personally reviewed and interpreted daily labs, tele strips, imagings as discussed above. I reviewed all nursing notes, pharmacy notes, vitals, pertinent old records I have discussed plan of care as described above with RN and patient/family.  CBC: Recent Labs  Lab 06/03/22 0430 06/04/22 0459 06/05/22 0338 06/06/22 0428 06/07/22 0601  WBC 3.1* 3.4* 3.8* 3.1* 3.2*  HGB 7.9* 8.1* 7.8* 7.3* 8.4*  HCT 26.3* 27.7* 25.8* 24.7* 28.4*  MCV 90.4 91.4 90.2 90.1 90.2  PLT 93* 109* 104* 91* 92*   Basic Metabolic Panel: Recent Labs  Lab 06/01/22 0451 06/02/22 0242 06/03/22 0430 06/04/22 0459 06/05/22 0338 06/06/22 0428 06/07/22 0601  NA 140  140 138 138 136 135 136  K 3.9 3.8 3.6 4.5 4.2 4.2 4.2  CL 103 101 97* 96* 94* 94* 93*  CO2 32 32 33* 34* 35* 34* 34*  GLUCOSE 103* 100* 89 97 93 88 85  BUN 45* 44* 47* 47* 52* 53* 57*  CREATININE 2.15* 2.00* 1.90* 1.93* 2.03* 2.02* 1.85*  CALCIUM 8.0* 7.6* 7.7* 7.8* 7.6* 7.5* 7.7*  MG 2.4 2.2 2.3  --   --  2.2 2.2  PHOS 4.1 3.4 4.0  --   --   --  3.3    Studies: DG Chest 2 View  Result Date: 06/07/2022 CLINICAL DATA:  History of congestive heart failure status post diuresis with persistent oxygen requirement EXAM: CHEST - 2 VIEW COMPARISON:  Chest radiograph dated 05/29/2022 FINDINGS: Low lung volumes. Bibasilar dense and patchy opacities. increased moderate right and small left pleural effusions. Enlarged cardiomediastinal silhouette. Median sternotomy wires are nondisplaced. IMPRESSION:  1. Increased moderate right and small left pleural effusions. 2. Bibasilar dense and patchy opacities, likely atelectasis. Electronically Signed   By: Darrin Nipper M.D.   On: 06/07/2022 09:30    Scheduled Meds:  amiodarone  200 mg Oral Daily   apixaban  2.5 mg Oral BID   atorvastatin  40 mg Oral QPM   bisacodyl  5 mg Oral QHS   carvedilol  3.125 mg Oral BID   guaiFENesin  600 mg Oral BID   hydrocerin   Topical BID   insulin aspart  0-15 Units Subcutaneous TID WC   isosorbide mononitrate  30 mg Oral Daily   pantoprazole  40 mg Oral BID AC   polyethylene glycol  17 g Oral Daily   spironolactone  12.5 mg Oral Daily   Vitamin D (Ergocalciferol)  50,000 Units Oral Q7 days   Continuous Infusions:  furosemide (LASIX) 200 mg in dextrose 5 % 100 mL (2 mg/mL) infusion      PRN Meds: acetaminophen, benzonatate, diclofenac Sodium, ondansetron (ZOFRAN) IV, mouth rinse  Time spent: 50 minutes  Author: Val Riles. MD [facility] 06/07/2022 2:22 PM  To reach On-call, see care teams to locate the attending and reach out to them via [email or web address]. If 7PM-7AM, please contact night-coverage If you still have difficulty reaching the attending provider, please page the [facility] (Director on Call) for [facility] on amion for assistance.

## 2022-06-07 NOTE — Progress Notes (Signed)
[place] NOTE       Patient ID: Tramond Slinker MRN: [id number] DOB/AGE: Jul 15, 1940 82 y.o.  Admit date: 05/29/2022 Referring Physician Dr. Val Riles Primary Physician Grier Rocher, PA-C ([place])  Primary Cardiologist Dr. Clayborn Bigness Reason for Consultation AoCHF  HPI: Grabiel Schmutz is a 55yoM with a PMH of CAD s/p CABG x2 1991 and BMS, paroxysmal AF s/p DCCV 2021 on Eliquis, HFpEF (LVEF 45-50%, mi-mod MR, mod TR 08/2021), HTN, DM2, CKD3, who presented to [facility] ED 05/29/22 with worsening shortness of breath and peripheral edema extending up to his thighs and abdomen. Cardiology is consulted on hospital day 2 for further assistance with his HF. Echo this admission revealed a worsening EF of 30-35%, g3DD, and mod reduced LV function, severely reduced RV function.  He diuresed well on a lasix infusion + IV diuresis (neg 12L).   Interval History:  -s/p 1 unit PRBCs yesterday, Hgb 7.3 -- 8.4 -continues to diurese well but still has an oxygen requirement 2-3.5L. Repeat Cxr today with increased pleural effusions + atelectasis. BNP 1027 today (1350 on 1/7)  - still dyspneic on exertion, no chest pain, edema improving   Net IO Since Admission: -12,345.28 mL [06/07/22 0958]   Review of systems complete and found to be negative unless listed above     Past Medical History:  Diagnosis Date   A-fib ([place])    a.) CHA2DS2-VASc = 6 (age x 2, CHF, HTN, previous MI, T2DM). b.) Rate/rhythm maintained on amiodarone + diltiazem + carvedilol; chronically anticoagulated using dabigatran. c.) DCCV 07/10/2015, 08/08/2019, 10/22/2019.   Arthritis    Chest pain 06/26/2019   CHF (congestive heart failure) (HCC)    CKD (chronic kidney disease), stage III ([place])    Coronary artery disease 1991   a.) 2v CABG in 1991. b.) PCI 06/07/2006 --> Vision stents to native LCx and RCA   Hyperlipidemia    Hypertension    Long term current use of anticoagulant    a.) dabigatran   Myocardial  infarction ([place]) 1991   a.) LHC --> 50% and 90% LAD lesions; referred to CVTS. 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   a.) LIMA-LAD, SVG-RCA   T2DM (type 2 diabetes mellitus) ([place])     Past Surgical History:  Procedure Laterality Date   BACK SURGERY  1970   L5-L6 ruptured disk   BLADDER SURGERY  2000   CARDIOVERSION N/A 07/10/2015   CARDIOVERSION N/A 08/08/2019   CARDIOVERSION N/A 10/22/2019   COLON SURGERY     COLONOSCOPY N/A 01/15/2020   Procedure: COLONOSCOPY;  Surgeon: Virgel Manifold, MD;  Location: [facility] ENDOSCOPY;  Service: Endoscopy;  Laterality: N/A;   COLONOSCOPY N/A 01/16/2020   Procedure: COLONOSCOPY;  Surgeon: Virgel Manifold, MD;  Location: [facility] ENDOSCOPY;  Service: Endoscopy;  Laterality: N/A;   CORONARY ARTERY BYPASS GRAFT N/A 01/02/1990   Procedure: 2v CORONARY ARTERY BYPASS GRAFT (LIMA-LAD, SVG-RCA)   ESOPHAGOGASTRODUODENOSCOPY N/A 01/13/2020   Procedure: ESOPHAGOGASTRODUODENOSCOPY (EGD);  Surgeon: Virgel Manifold, MD;  Location: [facility] ENDOSCOPY;  Service: Endoscopy;  Laterality: N/A;   REPLACEMENT TOTAL KNEE BILATERAL Bilateral 2008   STENT PLACEMENT VASCULAR ([place] HX)  2008   TOTAL KNEE REVISION Left 01/16/2020   Procedure: TOTAL KNEE REVISION;  Surgeon: Lovell Sheehan, MD;  Location: [facility] ORS;  Service: Orthopedics;  Laterality: Left;   TOTAL KNEE REVISION Left 05/31/2021   Procedure: TOTAL KNEE REVISION;  Surgeon:  Lovell Sheehan, MD;  Location: [facility] ORS;  Service: Orthopedics;  Laterality: Left;    Medications Prior to Admission  Medication Sig Dispense Refill Last Dose   amiodarone (PACERONE) 200 MG tablet Take 200 mg by mouth daily.   05/29/2022   apixaban (ELIQUIS) 2.5 MG TABS tablet Take 2.5 mg by mouth 2 (two) times daily.   05/29/2022   atorvastatin (LIPITOR) 40 MG tablet Take 40 mg by mouth every evening.    05/28/2022   carvedilol (COREG) 12.5 MG tablet Take 12.5 mg by mouth 2 (two) times  daily.   06/29/8339   PROAIR RESPICLICK 962 (90 Base) MCG/ACT AEPB Inhale into the lungs.   prn   ramipril (ALTACE) 10 MG capsule Take 1 capsule (10 mg total) by mouth daily. 30 capsule 1 05/29/2022   Semaglutide, 1 MG/DOSE, 2 MG/1.5ML SOPN Inject 1 mg into the skin every Thursday.   Past Month   torsemide (DEMADEX) 20 MG tablet Take 1 tablet (20 mg total) by mouth 2 (two) times daily. (Patient taking differently: Take 10-20 mg by mouth See admin instructions. Take 20 mg by mouth in the morning and 10 mg in the evening) 60 tablet 1 05/29/2022   vitamin B-12 (CYANOCOBALAMIN) 1000 MCG tablet Take 1,000 mcg by mouth daily.   05/29/2022   apixaban (ELIQUIS) 5 MG TABS tablet Take 1 tablet (5 mg total) by mouth 2 (two) times daily. (Patient not taking: Reported on 05/29/2022) 60 tablet 3 Not Taking   carvedilol (COREG) 3.125 MG tablet Take 1 tablet (3.125 mg total) by mouth 2 (two) times daily with a meal. (Patient not taking: Reported on 05/29/2022) 60 tablet 1 Not Taking   colchicine 0.6 MG tablet Take 0.6 mg by mouth 2 (two) times daily. (Patient not taking: Reported on 05/05/2022)      diltiazem (CARDIZEM CD) 240 MG 24 hr capsule Take 240 mg by mouth daily. (Patient not taking: Reported on 05/29/2022)   Not Taking   metFORMIN (GLUCOPHAGE-XR) 500 MG 24 hr tablet Take 500 mg by mouth 2 (two) times daily. (Patient not taking: Reported on 05/05/2022)       Social History   Socioeconomic History   Marital status: Widowed    Spouse name: Not on file   Number of children: Not on file   Years of education: Not on file   Highest education level: Not on file  Occupational History   Not on file  Tobacco Use   Smoking status: Former    Types: Cigarettes    Quit date: 70    Years since quitting: 33.0   Smokeless tobacco: Never  Vaping Use   Vaping Use: Never used  Substance and Sexual Activity   Alcohol use: Not Currently    Alcohol/week: 1.0 standard drink of alcohol    Types: 1 Cans of beer per week     Comment: very rarely   Drug use: Never   Sexual activity: Not on file  Other Topics Concern   Not on file  Social History Narrative   Not on file   Social Determinants of Health   Financial Resource Strain: Not on file  Food Insecurity: No Food Insecurity (05/30/2022)   Hunger Vital Sign    Worried About Running Out of Food in the Last Year: Never true    [place] in the Last Year: Never true  Transportation Needs: No Transportation Needs (05/30/2022)   PRAPARE - Hydrologist (Medical): No  Lack of Transportation (Non-Medical): No  Physical Activity: Not on file  Stress: Not on file  Social Connections: Not on file  Intimate Partner Violence: Not At Risk (05/30/2022)   Humiliation, Afraid, Rape, and Kick questionnaire    Fear of Current or Ex-Partner: No    Emotionally Abused: No    Physically Abused: No    Sexually Abused: No    History reviewed. No pertinent family history.    Intake/Output Summary (Last 24 hours) at 06/07/2022 0958 Last data filed at 06/07/2022 0700 Gross per 24 hour  Intake 920.83 ml  Output 1100 ml  Net -179.17 ml    Vitals:   06/06/22 2034 06/06/22 2339 06/07/22 0448 06/07/22 0816  BP: 113/71 (!) 103/54 111/63 (!) 115/57  Pulse: 76 79 77 75  Resp: (!) 24 20 (!) 24 20  Temp: (!) 97.5 F (36.4 C) (!) 97.5 F (36.4 C) (!) 97.5 F (36.4 C) 97.7 F (36.5 C)  TempSrc: Oral Oral Oral Oral  SpO2: 100% 97% 97% 96%  Weight:      Height:        PHYSICAL EXAM General: elderly and ill appearing caucasian male , well nourished, in no acute distress. Sitting upright in recliner PCU HEENT:  Normocephalic and atraumatic. Neck:  No JVD.  Lungs: slight conversational dyspnea on O2 by [place]. Decreased breath sounds right base Heart: HRRR . Normal S1 and S2 without gallops or murmurs.  Abdomen: mildly distended appearing with improving swelling along pannus Msk: Normal strength and tone for age. Extremities: chronic venous  stasis hyperpigmentation with woody appearance. Trace RLE edema.  Neuro: Alert and oriented X 3. Psych:  Answers questions appropriately.   Labs: Basic Metabolic Panel: Recent Labs    06/06/22 0428 06/07/22 0601  NA 135 136  K 4.2 4.2  CL 94* 93*  CO2 34* 34*  GLUCOSE 88 85  BUN 53* 57*  CREATININE 2.02* 1.85*  CALCIUM 7.5* 7.7*  MG 2.2 2.2  PHOS  --  3.3   Liver Function Tests: No results for input(s): "AST", "ALT", "ALKPHOS", "BILITOT", "PROT", "ALBUMIN" in the last 72 hours.  No results for input(s): "LIPASE", "AMYLASE" in the last 72 hours. CBC: Recent Labs    06/06/22 0428 06/07/22 0601  WBC 3.1* 3.2*  HGB 7.3* 8.4*  HCT 24.7* 28.4*  MCV 90.1 90.2  PLT 91* 92*   Cardiac Enzymes: No results for input(s): "CKTOTAL", "CKMB", "CKMBINDEX", "TROPONINIHS" in the last 72 hours.  BNP: Recent Labs    06/07/22 0601  BNP 1,027.2*   D-Dimer: No results for input(s): "DDIMER" in the last 72 hours. Hemoglobin A1C: No results for input(s): "HGBA1C" in the last 72 hours. Fasting Lipid Panel: No results for input(s): "CHOL", "HDL", "LDLCALC", "TRIG", "CHOLHDL", "LDLDIRECT" in the last 72 hours. Thyroid Function Tests: No results for input(s): "TSH", "T4TOTAL", "T3FREE", "THYROIDAB" in the last 72 hours.  Invalid input(s): "FREET3" Anemia Panel: No results for input(s): "VITAMINB12", "FOLATE", "FERRITIN", "TIBC", "IRON", "RETICCTPCT" in the last 72 hours.    Radiology: DG Chest 2 View  Result Date: 06/07/2022 CLINICAL DATA:  History of congestive heart failure status post diuresis with persistent oxygen requirement EXAM: CHEST - 2 VIEW COMPARISON:  Chest radiograph dated 05/29/2022 FINDINGS: Low lung volumes. Bibasilar dense and patchy opacities. increased moderate right and small left pleural effusions. Enlarged cardiomediastinal silhouette. Median sternotomy wires are nondisplaced. IMPRESSION: 1. Increased moderate right and small left pleural effusions. 2. Bibasilar  dense and patchy opacities, likely atelectasis. Electronically Signed  By: Agustin Cree M.D.   On: 06/07/2022 09:30   ECHOCARDIOGRAM COMPLETE  Result Date: 05/30/2022    ECHOCARDIOGRAM REPORT   Patient Name:   JALIK GELLATLY Date of Exam: 05/30/2022 Medical Rec #:  [id number]     Height:       68.0 in Accession #:    [id number]    Weight:       231.1 lb Date of Birth:  Sep 27, 1940      BSA:          2.173 m Patient Age:    81 years      BP:           114/55 mmHg Patient Gender: M             HR:           53 bpm. Exam Location:  [facility] Procedure: 2D Echo, Cardiac Doppler and Color Doppler Indications:    CHF- Acute Systolic I50.21  History:        Patient has prior history of Echocardiogram examinations, most                 recent 09/18/2021. CHF, CAD and Previous Myocardial Infarction,                 Prior CABG, Arrythmias:Atrial Fibrillation, Signs/Symptoms:Chest                 Pain; Risk Factors:Dyslipidemia, Hypertension, Sleep Apnea and                 Diabetes. CKD, stage III.  Sonographer:    Lucendia Herrlich Referring Phys: [id number] IULIA BASARABA IMPRESSIONS  1. Left ventricular ejection fraction, by estimation, is 30 to 35%. The left ventricle has moderately decreased function. Left ventricular endocardial border not optimally defined to evaluate regional wall motion. The left ventricular internal cavity size was mildly dilated. There is moderate left ventricular hypertrophy. Left ventricular diastolic parameters are consistent with Grade III diastolic dysfunction (restrictive).  2. Right ventricular systolic function is severely reduced. The right ventricular size is mildly enlarged. There is moderately elevated pulmonary artery systolic pressure.  3. Left atrial size was moderately dilated.  4. Right atrial size was moderately dilated.  5. The mitral valve is abnormal. Moderate mitral valve regurgitation. No evidence of mitral stenosis. Moderate mitral annular calcification.  6. Tricuspid valve regurgitation  is moderate to severe.  7. The aortic valve is normal in structure. Aortic valve regurgitation is not visualized. Aortic valve sclerosis is present, with no evidence of aortic valve stenosis.  8. Moderately dilated pulmonary artery.  9. The inferior vena cava is dilated in size with <50% respiratory variability, suggesting right atrial pressure of 15 mmHg. FINDINGS  Left Ventricle: Left ventricular ejection fraction, by estimation, is 30 to 35%. The left ventricle has moderately decreased function. Left ventricular endocardial border not optimally defined to evaluate regional wall motion. The left ventricular internal cavity size was mildly dilated. There is moderate left ventricular hypertrophy. Abnormal (paradoxical) septal motion consistent with post-operative status. Left ventricular diastolic parameters are consistent with Grade III diastolic dysfunction  (restrictive). Right Ventricle: The right ventricular size is mildly enlarged. No increase in right ventricular wall thickness. Right ventricular systolic function is severely reduced. There is moderately elevated pulmonary artery systolic pressure. The tricuspid regurgitant velocity is 2.92 m/s, and with an assumed right atrial pressure of 15 mmHg, the estimated right ventricular systolic pressure is 49.1 mmHg. Left Atrium: Left atrial size was moderately dilated.  Right Atrium: Right atrial size was moderately dilated. Pericardium: There is no evidence of pericardial effusion. Mitral Valve: The mitral valve is abnormal. There is mild thickening of the mitral valve leaflet(s). Moderate mitral annular calcification. Moderate mitral valve regurgitation. No evidence of mitral valve stenosis. Tricuspid Valve: The tricuspid valve is normal in structure. Tricuspid valve regurgitation is moderate to severe. No evidence of tricuspid stenosis. Aortic Valve: The aortic valve is normal in structure. Aortic valve regurgitation is not visualized. Aortic valve sclerosis is  present, with no evidence of aortic valve stenosis. Aortic valve mean gradient measures 3.5 mmHg. Aortic valve peak gradient measures 6.5 mmHg. Aortic valve area, by VTI measures 3.06 cm. Pulmonic Valve: The pulmonic valve was normal in structure. Pulmonic valve regurgitation is mild to moderate. No evidence of pulmonic stenosis. Aorta: The aortic root is normal in size and structure. Pulmonary Artery: The pulmonary artery is moderately dilated. Venous: The inferior vena cava is dilated in size with less than 50% respiratory variability, suggesting right atrial pressure of 15 mmHg. IAS/Shunts: No atrial level shunt detected by color flow Doppler.  LEFT VENTRICLE PLAX 2D LVIDd:         5.50 cm   Diastology LVIDs:         4.60 cm   LV e' medial:    5.43 cm/s LV PW:         1.40 cm   LV E/e' medial:  22.1 LV IVS:        1.40 cm   LV e' lateral:   6.97 cm/s LVOT diam:     2.20 cm   LV E/e' lateral: 17.2 LV SV:         86 LV SV Index:   40 LVOT Area:     3.80 cm  RIGHT VENTRICLE            IVC RV S prime:     3.88 cm/s  IVC diam: 2.50 cm TAPSE (M-mode): 1.0 cm LEFT ATRIUM           Index        RIGHT ATRIUM           Index LA diam:      4.80 cm 2.21 cm/m   RA Area:     27.70 cm LA Vol (A2C): 55.9 ml 25.72 ml/m  RA Volume:   99.70 ml  45.88 ml/m LA Vol (A4C): 71.9 ml 33.08 ml/m  AORTIC VALVE                    PULMONIC VALVE AV Area (Vmax):    3.04 cm     PR End Diast Vel: 10.24 msec AV Area (Vmean):   3.04 cm AV Area (VTI):     3.06 cm AV Vmax:           127.50 cm/s AV Vmean:          84.500 cm/s AV VTI:            0.281 m AV Peak Grad:      6.5 mmHg AV Mean Grad:      3.5 mmHg LVOT Vmax:         102.00 cm/s LVOT Vmean:        67.600 cm/s LVOT VTI:          0.226 m LVOT/AV VTI ratio: 0.80  AORTA Ao Root diam: 3.30 cm Ao Asc diam:  3.50 cm MITRAL VALVE  TRICUSPID VALVE MV Area (PHT): 4.06 cm     TR Peak grad:   34.1 mmHg MV Decel Time: 187 msec     TR Vmax:        292.00 cm/s MR Peak grad: 66.3  mmHg MR Vmax:      407.00 cm/s   SHUNTS MV E velocity: 120.00 cm/s  Systemic VTI:  0.23 m MV A velocity: 47.40 cm/s   Systemic Diam: 2.20 cm MV E/A ratio:  2.53 Lorine BearsMuhammad Arida MD Electronically signed by Lorine BearsMuhammad Arida MD Signature Date/Time: 05/30/2022/4:25:38 PM    Final    US ARTERIAL ABI (SCREENING LOWER EXTREMITY)  Result Date: 05/30/2022 CLINICAL DATA:  [age] year old male with a history of bilateral lower extremity edema EXAM: NONINVASIVE PHYSIOLOGIC VASCULAR STUDY OF BILATERAL LOWER EXTREMITIES TECHNIQUE: Evaluation of both lower extremities was performed at rest, including calculation of ankle-brachial indices, multiple segmental pressure evaluation, segmental Doppler and segmental pulse volume recording. COMPARISON:  None Available. FINDINGS: Right ABI:  1.47 Left ABI:  1.55 Right Lower Extremity: Segmental Doppler at the right ankle demonstrates monophasic waveforms Left Lower Extremity: Segmental Doppler at the left ankle demonstrates triphasic posterior tibial and dorsalis pedis waveform. IMPRESSION: Right: Resting ABI is likely falsely elevated secondary to noncompressible vessels, given the segmental Doppler. Segmental Doppler demonstrates monophasic waveforms at the right ankle, indicating more proximal occlusive disease. Left: Resting ABI likely falsely elevated secondary to noncompressible vessels. Segmental Doppler at the left ankle demonstrates waveforms relatively maintained. Signed, Yvone NeuJaime S. Miachel RouxWagner, DO, ABVM, RPVI [facility] Electronically Signed   By: Gilmer MorJaime  Wagner D.O.   On: 05/30/2022 10:56   DG Chest 2 View  Result Date: 05/29/2022 CLINICAL DATA:  Shortness of breath and ascites.  History of CHF. EXAM: CHEST - 2 VIEW COMPARISON:  09/16/2021 FINDINGS: Cardiac enlargement. Aortic atherosclerosis. Previous median sternotomy and CABG procedure. Bilateral pleural effusions, right greater than left. Mild interstitial edema. Atelectasis  noted in both lung bases. No airspace opacities. IMPRESSION: 1. Mild congestive heart failure. 2. Bibasilar atelectasis. Electronically Signed   By: Signa Kellaylor  Stroud M.D.   On: 05/29/2022 10:27    ECHO 05/30/2022 1. Left ventricular ejection fraction, by estimation, is 30 to 35%. The  left ventricle has moderately decreased function. Left ventricular  endocardial border not optimally defined to evaluate regional wall motion.  The left ventricular internal cavity  size was mildly dilated. There is moderate left ventricular hypertrophy.  Left ventricular diastolic parameters are consistent with Grade III  diastolic dysfunction (restrictive).   2. Right ventricular systolic function is severely reduced. The right  ventricular size is mildly enlarged. There is moderately elevated  pulmonary artery systolic pressure.   3. Left atrial size was moderately dilated.   4. Right atrial size was moderately dilated.   5. The mitral valve is abnormal. Moderate mitral valve regurgitation. No  evidence of mitral stenosis. Moderate mitral annular calcification.   6. Tricuspid valve regurgitation is moderate to severe.   7. The aortic valve is normal in structure. Aortic valve regurgitation is  not visualized. Aortic valve sclerosis is present, with no evidence of  aortic valve stenosis.   8. Moderately dilated pulmonary artery.   9. The inferior vena cava is dilated in size with <50% respiratory  variability, suggesting right atrial pressure of 15 mmHg.   TELEMETRY reviewed by me (LT) 06/07/2022 : NSR rate 70s  EKG reviewed by me: NSR bifacicular block   Data reviewed by me (LT) 06/07/2022: hospitalist progress  note, cbc, bmp, cxr, vitals, tele, I/o   Principal Problem:   Acute on chronic heart failure (HCC) Active Problems:   Atrial fibrillation status post cardioversion 10/22/19 [facility])   Essential hypertension   DM (diabetes mellitus), type 2 (HCC)   Stage 3b chronic kidney disease (CKD) (HCC)    Venous stasis dermatitis of both lower extremities    ASSESSMENT AND PLAN:  Jayvion Stefanski is a 65yoM with a PMH of CAD s/p CABG x2 1991 and BMS, paroxysmal AF s/p DCCV 2021 on Eliquis, HFpEF (LVEF 45-50%, mi-mod MR, mod TR 08/2021), HTN, DM2, CKD3, who presented to [facility] ED 05/29/22 with worsening shortness of breath and peripheral edema extending up to his thighs and abdomen. Cardiology is consulted on hospital day 2 for further assistance with his HF. Echo this admission revealed a worsening EF of 30-35%, g3DD, and mod reduced LV function, severely reduced RV function  # Acute on chronic HFrEF (30-35%, G3 DD 05/2022) Presents with several days of worsening shortness of breath, abdominal distention, and peripheral edema, refractory to an extra dose of torsemide 20 mg the day prior to admission.  Admits to eating primarily frozen dinners and canned soup since his family who he usually eats with has been sick and they have not been eating together for the past 2 to 3 weeks.  BNP is elevated to 1349 on admission and was clinically hypervolemic with pitting edema from his feet up to his lower abdomen/flanks. Clinical improvement after IV Lasix & infusion with  Net IO Since Admission: -12,345.28 mL [06/07/22 0958]  -S/p IV Lasix 60 mg x 1, 40 mg x 2, s/p Lasix infusion 8 mg/h, and days of lasix IV 40mg  BID, and metolazone 2.5mg  x 2 (Home diuretic torsemide 20 mg twice daily) - repeat CXR 1/16 with increasing pleural effusions and atelectasis.   - increase lasix to 60mg  BID, continue to monitor BMP daily  -Continue GDMT with Coreg 3.125 mg twice daily, spironolactone 12.5 mg once daily, imdur 30mg  daily -hold hydral with hypotension  -Hold ramipril 10 mg with diuresis.  Consider change to ARNI, SGLT2i pending clinical course as BP and renal function allow -Strict I's/O, discussed salt and fluid restriction at length -encouraged mobility + participation with PT  # AKI on CKD 3 Cr stable with diuresis 1.85,  current GFR 36, CR ranged from the past 6 months from 1.8-2.44 per review of his outpatient labs.  Monitor closely with diuresis  # Paroxysmal AF s/p DCCV 2021 Currently in sinus rhythm on telemetry, rate controlled on carvedilol 3.125 mg twice daily, rhythm controlled on amiodarone 200 mg once daily. -On dose reduced Eliquis 2.5 mg twice daily due to his age >93, and creatinine >1.5  -  CHA2DS2-VASc 6 (age, CHF, HTN, CAD, DM2)   # CAD s/p CABG x 2 (1991) # Demand ischemia Currently denies chest pain, troponins on admission barely elevated at 22, 23, which is most consistent with demand/supply mismatch and not ACS.   # anemia of chronic disease / IDA S/p IV iron x 3, 1 unit pRBCs.  Management per primary team   This patient's plan of care was discussed and created with Dr. 2022 and he is in agreement.  Signed: >83 , PA-C 06/07/2022, 9:58 AM [facility]

## 2022-06-08 DIAGNOSIS — I5023 Acute on chronic systolic (congestive) heart failure: Secondary | ICD-10-CM | POA: Diagnosis not present

## 2022-06-08 LAB — CBC
HCT: 25.4 % — ABNORMAL LOW (ref 39.0–52.0)
Hemoglobin: 7.6 g/dL — ABNORMAL LOW (ref 13.0–17.0)
MCH: 27.1 pg (ref 26.0–34.0)
MCHC: 29.9 g/dL — ABNORMAL LOW (ref 30.0–36.0)
MCV: 90.7 fL (ref 80.0–100.0)
Platelets: 90 10*3/uL — ABNORMAL LOW (ref 150–400)
RBC: 2.8 MIL/uL — ABNORMAL LOW (ref 4.22–5.81)
RDW: 17.4 % — ABNORMAL HIGH (ref 11.5–15.5)
WBC: 2.8 10*3/uL — ABNORMAL LOW (ref 4.0–10.5)
nRBC: 0 % (ref 0.0–0.2)

## 2022-06-08 LAB — HEPATIC FUNCTION PANEL
ALT: 31 U/L (ref 0–44)
AST: 46 U/L — ABNORMAL HIGH (ref 15–41)
Albumin: 2.6 g/dL — ABNORMAL LOW (ref 3.5–5.0)
Alkaline Phosphatase: 90 U/L (ref 38–126)
Bilirubin, Direct: 0.2 mg/dL (ref 0.0–0.2)
Indirect Bilirubin: 0.7 mg/dL (ref 0.3–0.9)
Total Bilirubin: 0.9 mg/dL (ref 0.3–1.2)
Total Protein: 6.1 g/dL — ABNORMAL LOW (ref 6.5–8.1)

## 2022-06-08 LAB — BASIC METABOLIC PANEL
Anion gap: 7 (ref 5–15)
BUN: 60 mg/dL — ABNORMAL HIGH (ref 8–23)
CO2: 34 mmol/L — ABNORMAL HIGH (ref 22–32)
Calcium: 7.6 mg/dL — ABNORMAL LOW (ref 8.9–10.3)
Chloride: 97 mmol/L — ABNORMAL LOW (ref 98–111)
Creatinine, Ser: 1.82 mg/dL — ABNORMAL HIGH (ref 0.61–1.24)
GFR, Estimated: 37 mL/min — ABNORMAL LOW (ref >60)
Glucose, Bld: 92 mg/dL (ref 70–99)
Potassium: 4.2 mmol/L (ref 3.5–5.1)
Sodium: 138 mmol/L (ref 135–145)

## 2022-06-08 LAB — GLUCOSE, CAPILLARY
Glucose-Capillary: 86 mg/dL (ref 70–99)
Glucose-Capillary: 95 mg/dL (ref 70–99)
Glucose-Capillary: 80 mg/dL (ref 70–99)
Glucose-Capillary: 99 mg/dL (ref 70–99)

## 2022-06-08 LAB — PHOSPHORUS: Phosphorus: 2.5 mg/dL (ref 2.5–4.6)

## 2022-06-08 LAB — LACTATE DEHYDROGENASE: LDH: 131 U/L (ref 98–192)

## 2022-06-08 LAB — MAGNESIUM: Magnesium: 2.1 mg/dL (ref 1.7–2.4)

## 2022-06-08 NOTE — Progress Notes (Signed)
Pt bottom is excoriated and purple.  Pt refuses to allow rolls.  Extends from sacrum to buttocks, perineum, scrotum, groin.  Cream applied and foam changed.

## 2022-06-08 NOTE — TOC Progression Note (Signed)
Transition of Care [facility]) - Progression Note    Patient Details  Name: Randall Vincent MRN: [id number] Date of Birth: July 13, 1940  Transition of Care [facility]) CM/SW Contact  Laurena Slimmer, RN Phone Number: 06/08/2022, 4:02 PM  Clinical Narrative:    Spoke with patient at bedside regarding [place]. He is agreeab to [facility]. He prefers for [facility] to speak with his daughter-in-law Juliann Pulse.  Contacted Kathy at [phone]. Juliann Pulse stated patient was previously with [facility] for services. They would prefer [facility] as first choice and [facility] as second choice. Juliann Pulse advised   Referral sent and accepted by Corene Cornea at [place].       Expected Discharge Plan: OP Rehab Barriers to Discharge: Continued Medical Work up  Expected Discharge Plan and Services     Post Acute Care Choice:  (Outpatient PT) Living arrangements for the past 2 months: Mobile Home                                       Social Determinants of Health (SDOH) Interventions [place]: No Food Insecurity (05/30/2022)  Housing: Low Risk  (05/30/2022)  Transportation Needs: No Transportation Needs (05/30/2022)  Utilities: Not At Risk (05/30/2022)  Tobacco Use: Medium Risk (05/30/2022)    Readmission Risk Interventions    06/01/2022    2:17 PM  Readmission Risk Prevention Plan  Transportation Screening Complete  PCP or Specialist Appt within 3-5 Days Complete  Social Work Consult for [place] Planning/Counseling Complete  Palliative Care Screening Not Applicable  Medication Review Press photographer) Complete

## 2022-06-08 NOTE — TOC Progression Note (Signed)
Transition of Care [facility]) - Progression Note    Patient Details  Name: Randall Vincent MRN: [id number] Date of Birth: 1940/09/27  Transition of Care [facility]) CM/SW Contact  Laurena Slimmer, RN Phone Number: 06/08/2022, 1:48 PM  Clinical Narrative:    Continued medical workup. Case reviewed for changes in disposition and needs.    Expected Discharge Plan: OP Rehab Barriers to Discharge: Continued Medical Work up  Expected Discharge Plan and Services     Post Acute Care Choice:  (Outpatient PT) Living arrangements for the past 2 months: Mobile Home                                       Social Determinants of Health (SDOH) Interventions [place]: No Food Insecurity (05/30/2022)  Housing: Low Risk  (05/30/2022)  Transportation Needs: No Transportation Needs (05/30/2022)  Utilities: Not At Risk (05/30/2022)  Tobacco Use: Medium Risk (05/30/2022)    Readmission Risk Interventions    06/01/2022    2:17 PM  Readmission Risk Prevention Plan  Transportation Screening Complete  PCP or Specialist Appt within 3-5 Days Complete  Social Work Consult for [place] Planning/Counseling Complete  Palliative Care Screening Not Applicable  Medication Review Press photographer) Complete

## 2022-06-08 NOTE — Progress Notes (Signed)
[facility] Card[facility]    SUBJECTIVE: Patient states no worsening shortness of breath  fever no chest pain complains of mild lower extremity edema currently on Lasix no pain   Vitals:   06/07/22 1716 06/07/22 2005 06/08/22 0036 06/08/22 0341  BP: (!) 114/51 101/70 (!) 106/49 (!) 100/45  Pulse: 79 81 84 81  Resp: 18 20 20 16   Temp: (!) 97.5 F (36.4 C) 97.9 F (36.6 C) (!) 97.5 F (36.4 C) 97.6 F (36.4 C)  TempSrc: Oral Oral    SpO2: 99% 99% 96% 100%  Weight:      Height:         Intake/Output Summary (Last 24 hours) at 06/08/2022 2725 Last data filed at 06/08/2022 3664 Gross per 24 hour  Intake 720 ml  Output 2000 ml  Net -1280 ml      PHYSICAL EXAM  General: Well developed, well nourished, in no acute distress HEENT:  Normocephalic and atramatic Neck:  No JVD.  Lungs: Clear bilaterally to auscultation and percussion. Heart: HRRR . Normal S1 and S2 without gallops or murmurs.  Abdomen: Bowel sounds are positive, abdomen soft and non-tender  Msk:  Back normal, normal gait. Normal strength and tone for age. Extremities: No clubbing, cyanosis or 2+edema.   Neuro: Alert and oriented X 3. Psych:  Good affect, responds appropriately   LABS: Basic Metabolic Panel: Recent Labs    06/07/22 0601 06/08/22 0357  NA 136 138  K 4.2 4.2  CL 93* 97*  CO2 34* 34*  GLUCOSE 85 92  BUN 57* 60*  CREATININE 1.85* 1.82*  CALCIUM 7.7* 7.6*  MG 2.2 2.1  PHOS 3.3 2.5   Liver Function Tests: No results for input(s): "AST", "ALT", "ALKPHOS", "BILITOT", "PROT", "ALBUMIN" in the last 72 hours. No results for input(s): "LIPASE", "AMYLASE" in the last 72 hours. CBC: Recent Labs    06/07/22 0601 06/08/22 0357  WBC 3.2* 2.8*  HGB 8.4* 7.6*  HCT 28.4* 25.4*  MCV 90.2 90.7  PLT 92* 90*   Cardiac Enzymes: No results for input(s): "CKTOTAL", "CKMB", "CKMBINDEX", "TROPONINI" in the last 72 hours. BNP: Invalid input(s): "POCBNP" D-Dimer: No results for input(s): "DDIMER" in the last  72 hours. Hemoglobin A1C: No results for input(s): "HGBA1C" in the last 72 hours. Fasting Lipid Panel: No results for input(s): "CHOL", "HDL", "LDLCALC", "TRIG", "CHOLHDL", "LDLDIRECT" in the last 72 hours. Thyroid Function Tests: No results for input(s): "TSH", "T4TOTAL", "T3FREE", "THYROIDAB" in the last 72 hours.  Invalid input(s): "FREET3" Anemia Panel: No results for input(s): "VITAMINB12", "FOLATE", "FERRITIN", "TIBC", "IRON", "RETICCTPCT" in the last 72 hours.  DG Chest Port 1 View  Result Date: 06/07/2022 CLINICAL DATA:  [id number] Pleural effusion on right [id number] Status post thoracentesis [id number] EXAM: PORTABLE CHEST 1 VIEW COMPARISON:  06/07/2022 FINDINGS: Postoperative changes in the mediastinum. Cardiac enlargement. Pulmonary vascular congestion with perihilar edema and bilateral pleural effusions. The right pleural effusion demonstrates improvement since prior study. Basilar atelectasis. No pneumothorax. Calcification of the aorta. Degenerative changes in the shoulders. IMPRESSION: Cardiac enlargement with pulmonary vascular congestion, perihilar edema, and pleural effusions. Right pleural effusion is improved since prior study. No pneumothorax. Electronically Signed   By: Lucienne Capers M.D.   On: 06/07/2022 15:28   US THORACENTESIS ASP PLEURAL SPACE W/IMG GUIDE  Result Date: 06/07/2022 INDICATION: Patient with history of HFmrEF, AFib on Eliquis, CKD stage 3 complaining of SOB, BLE edema and abdominal swelling. Chest x-ray demonstrated bilateral pleural effusions. Request received for diagnostic and therapeutic thoracentesis. EXAM:  ULTRASOUND GUIDED DIAGNOSTIC AND THERAPEUTIC RIGHT THORACENTESIS MEDICATIONS: 10 mL 1 % lidocaine COMPLICATIONS: None immediate. PROCEDURE: An ultrasound guided thoracentesis was thoroughly discussed with the patient and questions answered. The benefits, risks, alternatives and complications were also discussed. The patient understands and wishes to  proceed with the procedure. Written consent was obtained. Ultrasound was performed to localize and mark an adequate pocket of fluid in the right chest. The area was then prepped and draped in the normal sterile fashion. 1% Lidocaine was used for local anesthesia. Under ultrasound guidance a 6 Fr Safe-T-Centesis catheter was introduced. Thoracentesis was performed. The catheter was removed and a dressing applied. FINDINGS: A total of approximately 1 L of clear, yellow fluid was removed. Samples were sent to the laboratory as requested by the clinical team. IMPRESSION: Successful ultrasound guided right thoracentesis yielding 1 L of pleural fluid. Read by: Narda Rutherford, AGNP-BC Electronically Signed   By: Jerilynn Mages.  Shick M.D.   On: 06/07/2022 15:24   DG Chest 2 View  Result Date: 06/07/2022 CLINICAL DATA:  History of congestive heart failure status post diuresis with persistent oxygen requirement EXAM: CHEST - 2 VIEW COMPARISON:  Chest radiograph dated 05/29/2022 FINDINGS: Low lung volumes. Bibasilar dense and patchy opacities. increased moderate right and small left pleural effusions. Enlarged cardiomediastinal silhouette. Median sternotomy wires are nondisplaced. IMPRESSION: 1. Increased moderate right and small left pleural effusions. 2. Bibasilar dense and patchy opacities, likely atelectasis. Electronically Signed   By: Darrin Nipper M.D.   On: 06/07/2022 09:30     Echo mildly depressed left ventricular function EF around 45 to 50%  TELEMETRY: Atrial fibrillation rate of around 70:  ASSESSMENT AND PLAN:  Principal Problem:   Acute on chronic heart failure [facility]) Active Problems:   Atrial fibrillation status post cardioversion 10/22/19 [facility])   Essential hypertension   DM (diabetes mellitus), type 2 (HCC)   Stage 3b chronic kidney disease (CKD) ([place])   Venous stasis dermatitis of both lower extremities    Plan Acute chronic systolic congestive heart failure ejection fraction around 45 to 50% continue  torsemide on Lasix support stockings fluid restriction Metolazone twice a week for heart failure Anemia continue supplemental oxygen therapy support, GI bleeding Chronic renal insufficiency stage IIIb continue current therapy follow-up renal function maintain adequate hydration Atrial fibrillation status post cardioversion in the distant past currently on amiodarone and Eliquis as well as Coreg Venous stasis dermatitis lower extremities continue local therapy and care Unna boot support stockings elevation Hypertension continue current medications Diabetes type 2 was on semaglutide will discontinue for now because of possible scrotal infection Possible cellulitis of the scrotum continue broad-spectrum antibiotic therapy consider urology follow-up evaluation Continue conservative cardiac input at this stage    Yolonda Kida, MD 06/08/2022 7:52 AM

## 2022-06-08 NOTE — Progress Notes (Signed)
[facility] Progress Note  Patient: Randall Vincent    VQQ:[id number]  DOA: 05/29/2022     Date of Service: the patient was seen and examined on 06/08/2022  Chief Complaint  Patient presents with   Shortness of Breath   Brief hospital course: [name] is a 82 y.o. 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 ED w/up: Chest x-ray was obtained that demonstrated interstitial edema, mild, with bilateral pleural effusions. Due to failure of outpatient treatment, [facility] contacted for admission for acute on chronic heart failure.    Assessment and Plan:  # Acute on chronic systolic heart failure  Patient has a history of moderately reduced EF with last EF of 45-50%, follows with Dr. Clayborn Bigness.  Despite increasing doses of home torsemide, patient presents with marked pitting edema extending up to the mid abdomen. - S/p Lasix 60 mg IV once and  s/p Lasix 40 mg IV BID - Strict in and out - Daily weights - Continue home GDMT TTE shows LVEF 30 to 43%, grade 3 diastolic dysfunction, right ventricular failure, moderate pulm hypertension, moderate MR and moderate to severe TR Cardiology consulted for further recommendation. Metolazone 2.5 mg p.o. x 1 dose on 1/10 and 1/12  ordered by cardiology 1/11 started spironolactone 12.5 mg p.o. daily as per cardiology On 1/8 started Lasix IV infusion due to anasarca 1/13 DC Lasix infusion, s/p Lasix IV 60 mg x 1 dose, 40 mg x 2 1/14 s/p Lasix 40 mg x BID, 3 more doses and then start torsemide home dose as per cardiology 1/13 Started Imdur 30 mg p.o. daily 1/16 CXR showed pleural effusion more on the right side, patient agreed for thoracentesis, s/p thoracentesis 1 L fluid was tapped.  BNP 1027 elevated 1/16  restarted Lasix IV infusion   Anemia of chronic disease, iron deficiency and occult GI bleeding, unknown source FOBT positive but no overt GI bleeding Discussed with GI, patient is not stable for EGD at this time and no overt GI bleeding, patient refusing colonoscopy.  GI recommended to continue Eliquis for now given benefit overweigh the risk of bleeding Monitor H&H and transfuse if hemoglobin less than 8 1/15 Hb 7.3, transfuse 1 unit of PRBC 1/16 Hb 8.4, stable, platelet 92k 1/17 Hb 7.6, dropping again patient denies any GI bleeding We will continue to monitor H&H, may need PRBC transfusion tomorrow a.m.   Iron deficiency, iron level 16, transferrin saturation 7% so started Venofer 300 mg IV daily x 3 doses, start oral iron supplement on discharge.  B12 and folate within normal range.  Stage 3b chronic kidney disease (CKD) Patient's renal function frequently fluctuates between 1.6 and 2.2.   Currently 2.18, previously 1.64.  I wonder if there is a component of cardiorenal syndrome.  Will monitor closely while diuresing. - Daily BMP - Monitor urine output   Atrial fibrillation status post cardioversion 10/22/19  Currently rate controlled. Continue home amiodarone 200 POD, Eliquis 2.5 twice daily, and Coreg 3.125 twice daily   Venous stasis dermatitis of both lower extremities Per Dr. Etta Quill most recent note, plans were to obtain ABIs.  Will do so at this time and if no evidence of PAD, plan to order Unna boots ABI:  Right ABI:  1.47 and Left ABI:  1.55 Right  Lower Extremity: Segmental Doppler at the right ankle demonstrates monophasic waveforms Left Lower Extremity: Segmental Doppler at the left ankle demonstrates triphasic posterior tibial and dorsalis pedis waveform. - pt may need Unna boots, will reassess after diuresis 1/15 apply Eucerin cream bilateral lower extremities due to dryness   Essential hypertension - Continue home antihypertensives   DM (diabetes mellitus),  type 2 ([place]) Well-managed with semaglutide with last A1c of 5.9% 4 weeks ago. - Hold home semaglutide while admitted - SSI, moderate   Vitamin D deficiency: started vitamin D 50,000 units p.o. weekly, follow with PCP to repeat vitamin D level after 3 to 6 months.  Body mass index is 35.14 kg/m.  Interventions:     Diet: Heart healthy diet, fluid striction 1.5 L/day DVT Prophylaxis: Therapeutic Anticoagulation with Eliquis    Advance goals of care discussion: DNR  Family Communication: family was not present at bedside, at the time of interview.  The pt provided permission to discuss medical plan with the family. Opportunity was given to ask question and all questions were answered satisfactorily.   Disposition:  Pt is from Home, admitted with volume overload on IV Lasix infusion, still has anasarca, which precludes a safe discharge. Discharge to home, when clinically stable, may need 1-2 days with IV diuresis.    Subjective: No significant overnight events, patient feels improvement in the shortness of breath after thoracentesis, tolerated procedure well.  Patient is feeling edema is improving but still has fluid in the abdominal wall on the sides.  Patient is having some productive cough, denies any GI bleeding.  No any other active issues.  Physical Exam: General: NAD, lying comfortably, mild SOB Appear in no distress, affect appropriate Eyes: Right eye PERRLA, (left eye blind since childhood) ENT: Oral Mucosa Clear, moist  Neck: no JVD,  Cardiovascular: S1 and S2 Present, no Murmur,  Respiratory: good respiratory effort, Bilateral Air entry equal and Decreased, mild bilateral crackles, no wheezes Abdomen: Bowel Sound present, Soft and no tenderness, edematous abdominal wall Skin: no rashes Extremities: mild Pedal edema, no calf tenderness, chronic venous stasis pigmentation Neurologic: without any new focal findings Gait not checked due to patient safety concerns  Vitals:    06/08/22 0036 06/08/22 0341 06/08/22 0757 06/08/22 1145  BP: (!) 106/49 (!) 100/45 (!) 117/59 (!) 111/57  Pulse: 84 81 73 76  Resp: 20 16 18 18   Temp: (!) 97.5 F (36.4 C) 97.6 F (36.4 C) 97.8 F (36.6 C) 98 F (36.7 C)  TempSrc:   Oral Oral  SpO2: 96% 100% 100% 100%  Weight:   92.3 kg   Height:        Intake/Output Summary (Last 24 hours) at 06/08/2022 1405 Last data filed at 06/08/2022 1146 Gross per 24 hour  Intake 720 ml  Output 2300 ml  Net -1580 ml   Filed Weights   06/04/22 0800 06/07/22 1039 06/08/22 0757  Weight: 95.9 kg 92.6 kg 92.3 kg    Data Reviewed: I have personally reviewed and interpreted daily labs, tele strips, imagings as discussed above. I reviewed all nursing notes, pharmacy notes, vitals, pertinent old records I have discussed plan of care as described above with RN and patient/family.  CBC: Recent Labs  Lab 06/04/22 0459 06/05/22 0338 06/06/22 0428 06/07/22 0601 06/08/22 0357  WBC 3.4* 3.8* 3.1* 3.2* 2.8*  HGB 8.1* 7.8* 7.3* 8.4* 7.6*  HCT 27.7* 25.8* 24.7* 28.4* 25.4*  MCV 91.4 90.2 90.1 90.2 90.7  PLT 109* 104* 91* 92* 90*  Basic Metabolic Panel: Recent Labs  Lab 06/02/22 0242 06/03/22 0430 06/04/22 0459 06/05/22 0338 06/06/22 0428 06/07/22 0601 06/08/22 0357  NA 140 138 138 136 135 136 138  K 3.8 3.6 4.5 4.2 4.2 4.2 4.2  CL 101 97* 96* 94* 94* 93* 97*  CO2 32 33* 34* 35* 34* 34* 34*  GLUCOSE 100* 89 97 93 88 85 92  BUN 44* 47* 47* 52* 53* 57* 60*  CREATININE 2.00* 1.90* 1.93* 2.03* 2.02* 1.85* 1.82*  CALCIUM 7.6* 7.7* 7.8* 7.6* 7.5* 7.7* 7.6*  MG 2.2 2.3  --   --  2.2 2.2 2.1  PHOS 3.4 4.0  --   --   --  3.3 2.5    Studies: DG Chest Port 1 View  Result Date: 06/07/2022 CLINICAL DATA:  [id number] Pleural effusion on right [id number] Status post thoracentesis [id number] EXAM: PORTABLE CHEST 1 VIEW COMPARISON:  06/07/2022 FINDINGS: Postoperative changes in the mediastinum. Cardiac enlargement. Pulmonary vascular congestion  with perihilar edema and bilateral pleural effusions. The right pleural effusion demonstrates improvement since prior study. Basilar atelectasis. No pneumothorax. Calcification of the aorta. Degenerative changes in the shoulders. IMPRESSION: Cardiac enlargement with pulmonary vascular congestion, perihilar edema, and pleural effusions. Right pleural effusion is improved since prior study. No pneumothorax. Electronically Signed   By: Lucienne Capers M.D.   On: 06/07/2022 15:28   US THORACENTESIS ASP PLEURAL SPACE W/IMG GUIDE  Result Date: 06/07/2022 INDICATION: Patient with history of HFmrEF, AFib on Eliquis, CKD stage 3 complaining of SOB, BLE edema and abdominal swelling. Chest x-ray demonstrated bilateral pleural effusions. Request received for diagnostic and therapeutic thoracentesis. EXAM: ULTRASOUND GUIDED DIAGNOSTIC AND THERAPEUTIC RIGHT THORACENTESIS MEDICATIONS: 10 mL 1 % lidocaine COMPLICATIONS: None immediate. PROCEDURE: An ultrasound guided thoracentesis was thoroughly discussed with the patient and questions answered. The benefits, risks, alternatives and complications were also discussed. The patient understands and wishes to proceed with the procedure. Written consent was obtained. Ultrasound was performed to localize and mark an adequate pocket of fluid in the right chest. The area was then prepped and draped in the normal sterile fashion. 1% Lidocaine was used for local anesthesia. Under ultrasound guidance a 6 Fr Safe-T-Centesis catheter was introduced. Thoracentesis was performed. The catheter was removed and a dressing applied. FINDINGS: A total of approximately 1 L of clear, yellow fluid was removed. Samples were sent to the laboratory as requested by the clinical team. IMPRESSION: Successful ultrasound guided right thoracentesis yielding 1 L of pleural fluid. Read by: Narda Rutherford, AGNP-BC Electronically Signed   By: Jerilynn Mages.  Shick M.D.   On: 06/07/2022 15:24    Scheduled Meds:  amiodarone  200  mg Oral Daily   apixaban  2.5 mg Oral BID   atorvastatin  40 mg Oral QPM   bisacodyl  5 mg Oral QHS   carvedilol  3.125 mg Oral BID   guaiFENesin  600 mg Oral BID   hydrocerin   Topical BID   insulin aspart  0-15 Units Subcutaneous TID WC   isosorbide mononitrate  30 mg Oral Daily   pantoprazole  40 mg Oral BID AC   polyethylene glycol  17 g Oral Daily   spironolactone  12.5 mg Oral Daily   Vitamin D (Ergocalciferol)  50,000 Units Oral Q7 days   Continuous Infusions:  furosemide (LASIX) 200 mg in dextrose 5 % 100 mL (2 mg/mL) infusion 5 mg/hr (06/07/22 1538)    PRN Meds: acetaminophen, benzonatate, diclofenac Sodium, ondansetron (ZOFRAN) IV, mouth rinse  Time spent:  50 minutes  Author: Gillis Santa. MD [facility] 06/08/2022 2:05 PM  To reach On-call, see care teams to locate the attending and reach out to them via [email or web address]. If 7PM-7AM, please contact night-coverage If you still have difficulty reaching the attending provider, please page the [facility] (Director on Call) for [facility] on amion for assistance.

## 2022-06-08 NOTE — Progress Notes (Signed)
Physical Therapy Treatment Patient Details Name: Randall Vincent MRN: [id number] DOB: 08-26-1940 Today's Date: 06/08/2022   History of Present Illness Patient is an 82 year old male who presents to ED for SOB. Patient has had gradually worsening LE swelling that reached to his abdomen. 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    PT Comments    Pt showed good effort and clearly feeling/breathing better today despite continued cough and occasionally getting up some phlegm.  Pt was able to do 2 prolonged bouts of ambulation, 130 ft then after seated rest break and PLB (with SpO2 back to 90s) another bout of 51ft with heavy walker reliance.  He was able to do much more today and is clearly pleased with this.  Pt needing 3L O2 t/o the effort with sats dropping as low as 86% at the end of the longer bout of ambulation but again feeling and moving better compared to how he did with the PT yesterday.  Recommendations for follow up therapy are one component of a multi-disciplinary discharge planning process, led by the attending physician.  Recommendations may be updated based on patient status, additional functional criteria and insurance authorization.  Follow Up Recommendations  Home health PT     Assistance Recommended at Discharge Set up Supervision/Assistance  Patient can return home with the following Assistance with cooking/housework;A little help with bathing/dressing/bathroom;Assist for transportation;Help with stairs or ramp for entrance;A little help with walking and/or transfers   Equipment Recommendations  None recommended by PT    Recommendations for Other Services       Precautions / Restrictions Precautions Precautions: Fall Restrictions Weight Bearing Restrictions: No     Mobility  Bed Mobility Overal bed mobility: Needs Assistance Bed Mobility: Supine to Sit     Supine to sit: Min guard, Min assist           Transfers Overall transfer level: Needs assistance Equipment used: Rolling walker (2 wheels) Transfers: Sit to/from Stand Sit to Stand: Supervision           General transfer comment: Heavy need for UEs and cuing to insuring appropriate set up for sit to stand but able to do so multiple times this session w/o direct assist    Ambulation/Gait Ambulation/Gait assistance: Supervision Gait Distance (Feet): 130 Feet Assistive device: Rolling walker (2 wheels)         General Gait Details: 3L O2 on t/o the effort, seated at rest SpO2 in the mid 90s, dropped as low as 86% during ambulation but generally staying in the 89-91% range most of the time.   Stairs             Wheelchair Mobility    Modified Rankin (Stroke Patients Only)       Balance Overall balance assessment: Needs assistance Sitting-balance support: No upper extremity supported, Feet supported Sitting balance-Leahy Scale: Good     Standing balance support: Bilateral upper extremity supported Standing balance-Leahy Scale: Good Standing balance comment: With UEs on walker pt showed ability to appropriately maintain standing balance statically and dynamically                            Cognition Arousal/Alertness: Awake/alert Behavior During Therapy: WFL for tasks assessed/performed Overall Cognitive Status: Within Functional Limits for tasks assessed  Exercises Other Exercises Other Exercises: Pt again unable to self clean up after small BM, but able to maintain static standing longer and with more confidence today with PT clean up - nursing notified    General Comments General comments (skin integrity, edema, etc.): 3L O2, pt breathing much better today and able to do more, SpO2 did drop to mid 80s after prolonged walk but generally stayed near 90% (+/- 2) even during light activity      Pertinent Vitals/Pain Pain  Assessment Pain Assessment: No/denies pain    Home Living                          Prior Function            PT Goals (current goals can now be found in the care plan section) Progress towards PT goals: Progressing toward goals    Frequency    Min 2X/week      PT Plan Discharge plan needs to be updated    Co-evaluation              AM-PAC PT "6 Clicks" Mobility   Outcome Measure  Help needed turning from your back to your side while in a flat bed without using bedrails?: None Help needed moving from lying on your back to sitting on the side of a flat bed without using bedrails?: A Little Help needed moving to and from a bed to a chair (including a wheelchair)?: A Little Help needed standing up from a chair using your arms (e.g., wheelchair or bedside chair)?: A Little Help needed to walk in hospital room?: A Little Help needed climbing 3-5 steps with a railing? : A Little 6 Click Score: 19    End of Session Equipment Utilized During Treatment: Oxygen Activity Tolerance: Patient tolerated treatment well Patient left: in chair;with chair alarm set;with call bell/phone within reach Nurse Communication: Mobility status PT Visit Diagnosis: Unsteadiness on feet (R26.81);Other abnormalities of gait and mobility (R26.89);Repeated falls (R29.6);Muscle weakness (generalized) (M62.81);History of falling (Z91.81);Difficulty in walking, not elsewhere classified (R26.2)     Time: 1638-4665 PT Time Calculation (min) (ACUTE ONLY): 28 min  Charges:  $Gait Training: 8-22 mins $Therapeutic Activity: 8-22 mins                     Kreg Shropshire, DPT 06/08/2022, 11:55 AM

## 2022-06-09 DIAGNOSIS — I5023 Acute on chronic systolic (congestive) heart failure: Secondary | ICD-10-CM | POA: Diagnosis not present

## 2022-06-09 LAB — CBC
HCT: 25.4 % — ABNORMAL LOW (ref 39.0–52.0)
Hemoglobin: 7.6 g/dL — ABNORMAL LOW (ref 13.0–17.0)
MCH: 27.2 pg (ref 26.0–34.0)
MCHC: 29.9 g/dL — ABNORMAL LOW (ref 30.0–36.0)
MCV: 91 fL (ref 80.0–100.0)
Platelets: 93 10*3/uL — ABNORMAL LOW (ref 150–400)
RBC: 2.79 MIL/uL — ABNORMAL LOW (ref 4.22–5.81)
RDW: 17.4 % — ABNORMAL HIGH (ref 11.5–15.5)
WBC: 2.5 10*3/uL — ABNORMAL LOW (ref 4.0–10.5)
nRBC: 0 % (ref 0.0–0.2)

## 2022-06-09 LAB — BASIC METABOLIC PANEL
Anion gap: 8 (ref 5–15)
BUN: 61 mg/dL — ABNORMAL HIGH (ref 8–23)
CO2: 34 mmol/L — ABNORMAL HIGH (ref 22–32)
Calcium: 7.9 mg/dL — ABNORMAL LOW (ref 8.9–10.3)
Chloride: 97 mmol/L — ABNORMAL LOW (ref 98–111)
Creatinine, Ser: 1.75 mg/dL — ABNORMAL HIGH (ref 0.61–1.24)
GFR, Estimated: 39 mL/min — ABNORMAL LOW (ref >60)
Glucose, Bld: 92 mg/dL (ref 70–99)
Potassium: 4.1 mmol/L (ref 3.5–5.1)
Sodium: 139 mmol/L (ref 135–145)

## 2022-06-09 LAB — GLUCOSE, CAPILLARY
Glucose-Capillary: 82 mg/dL (ref 70–99)
Glucose-Capillary: 113 mg/dL — ABNORMAL HIGH (ref 70–99)
Glucose-Capillary: 82 mg/dL (ref 70–99)
Glucose-Capillary: 96 mg/dL (ref 70–99)

## 2022-06-09 LAB — PHOSPHORUS: Phosphorus: 2 mg/dL — ABNORMAL LOW (ref 2.5–4.6)

## 2022-06-09 LAB — PREPARE RBC (CROSSMATCH)

## 2022-06-09 LAB — MAGNESIUM: Magnesium: 2.1 mg/dL (ref 1.7–2.4)

## 2022-06-09 MED ORDER — SODIUM CHLORIDE 0.9% IV SOLUTION: Freq: Once | INTRAVENOUS | Status: DC

## 2022-06-09 MED ORDER — METOLAZONE 5 MG PO TABS
5.0000 mg | ORAL_TABLET | ORAL | Status: DC
  Administered 2022-06-09: 5 mg via ORAL
  Filled 2022-06-09: qty 1

## 2022-06-09 NOTE — Progress Notes (Signed)
[facility]) Hospital Liaison Note   Son/Paul contacted [facility] services to inquire of Hospice Services. Prior to contacting family, MSW notified both MD/Kumar & TOC/Keona. MSW provided extensive education of hospice ([place]).  Family requested additional time to discuss options with patient and to see how patient responds to procedure scheduled for 1.19.  At this time, patient is not under review for [facility] services as family continues to have ongoing [facility] discussions. Family has requested that [facility] reach out for f/u on 1.20.   [facility] information and contact numbers given to family & above information shared with TOC.   Please call with any questions/concerns.    Thank you for the opportunity to participate in this patient's care.   Phillis Haggis, MSW [facility] Liaison  [phone]

## 2022-06-09 NOTE — Progress Notes (Signed)
[facility] Cardiology    SUBJECTIVE: Patient resting lethargic resting in bed denies any pain denies any worsening shortness of breath   Vitals:   06/08/22 2310 06/09/22 0415 06/09/22 0724 06/09/22 0939  BP: (!) 116/55 (!) 110/52 (!) 114/55 (!) 108/50  Pulse: 77 82 80 86  Resp: 16 17 18    Temp: 98 F (36.7 C) 97.7 F (36.5 C) 97.9 F (36.6 C)   TempSrc: Oral Oral    SpO2: 100% 98% 97% 95%  Weight:      Height:         Intake/Output Summary (Last 24 hours) at 06/09/2022 1047 Last data filed at 06/09/2022 1043 Gross per 24 hour  Intake 500 ml  Output 5600 ml  Net -5100 ml      PHYSICAL EXAM  General: Well developed, well nourished, in no acute distress HEENT:  Normocephalic and atramatic Neck:  No JVD.  Lungs: Clear bilaterally to auscultation and percussion. Heart: HRRR . Normal S1 and S2 without gallops or murmurs.  Abdomen: Bowel sounds are positive, abdomen soft and non-tender  Msk:  Back normal, normal gait. Normal strength and tone for age. Extremities: No clubbing, cyanosis or edema.   Neuro: Alert and oriented X 3. Psych:  Good affect, responds appropriately   LABS: Basic Metabolic Panel: Recent Labs    06/08/22 0357 06/09/22 0618  NA 138 139  K 4.2 4.1  CL 97* 97*  CO2 34* 34*  GLUCOSE 92 92  BUN 60* 61*  CREATININE 1.82* 1.75*  CALCIUM 7.6* 7.9*  MG 2.1 2.1  PHOS 2.5 2.0*   Liver Function Tests: Recent Labs    06/08/22 1436  AST 46*  ALT 31  ALKPHOS 90  BILITOT 0.9  PROT 6.1*  ALBUMIN 2.6*   No results for input(s): "LIPASE", "AMYLASE" in the last 72 hours. CBC: Recent Labs    06/08/22 0357 06/09/22 0618  WBC 2.8* 2.5*  HGB 7.6* 7.6*  HCT 25.4* 25.4*  MCV 90.7 91.0  PLT 90* 93*   Cardiac Enzymes: No results for input(s): "CKTOTAL", "CKMB", "CKMBINDEX", "TROPONINI" in the last 72 hours. BNP: Invalid input(s): "POCBNP" D-Dimer: No results for input(s): "DDIMER" in the last 72 hours. Hemoglobin A1C: No results for input(s):  "HGBA1C" in the last 72 hours. Fasting Lipid Panel: No results for input(s): "CHOL", "HDL", "LDLCALC", "TRIG", "CHOLHDL", "LDLDIRECT" in the last 72 hours. Thyroid Function Tests: No results for input(s): "TSH", "T4TOTAL", "T3FREE", "THYROIDAB" in the last 72 hours.  Invalid input(s): "FREET3" Anemia Panel: No results for input(s): "VITAMINB12", "FOLATE", "FERRITIN", "TIBC", "IRON", "RETICCTPCT" in the last 72 hours.  DG Chest Port 1 View  Result Date: 06/07/2022 CLINICAL DATA:  [id number] Pleural effusion on right [id number] Status post thoracentesis [id number] EXAM: PORTABLE CHEST 1 VIEW COMPARISON:  06/07/2022 FINDINGS: Postoperative changes in the mediastinum. Cardiac enlargement. Pulmonary vascular congestion with perihilar edema and bilateral pleural effusions. The right pleural effusion demonstrates improvement since prior study. Basilar atelectasis. No pneumothorax. Calcification of the aorta. Degenerative changes in the shoulders. IMPRESSION: Cardiac enlargement with pulmonary vascular congestion, perihilar edema, and pleural effusions. Right pleural effusion is improved since prior study. No pneumothorax. Electronically Signed   By: Lucienne Capers M.D.   On: 06/07/2022 15:28   US THORACENTESIS ASP PLEURAL SPACE W/IMG GUIDE  Result Date: 06/07/2022 INDICATION: Patient with history of HFmrEF, AFib on Eliquis, CKD stage 3 complaining of SOB, BLE edema and abdominal swelling. Chest x-ray demonstrated bilateral pleural effusions. Request received for diagnostic and therapeutic thoracentesis. EXAM:  ULTRASOUND GUIDED DIAGNOSTIC AND THERAPEUTIC RIGHT THORACENTESIS MEDICATIONS: 10 mL 1 % lidocaine COMPLICATIONS: None immediate. PROCEDURE: An ultrasound guided thoracentesis was thoroughly discussed with the patient and questions answered. The benefits, risks, alternatives and complications were also discussed. The patient understands and wishes to proceed with the procedure. Written consent was  obtained. Ultrasound was performed to localize and mark an adequate pocket of fluid in the right chest. The area was then prepped and draped in the normal sterile fashion. 1% Lidocaine was used for local anesthesia. Under ultrasound guidance a 6 Fr Safe-T-Centesis catheter was introduced. Thoracentesis was performed. The catheter was removed and a dressing applied. FINDINGS: A total of approximately 1 L of clear, yellow fluid was removed. Samples were sent to the laboratory as requested by the clinical team. IMPRESSION: Successful ultrasound guided right thoracentesis yielding 1 L of pleural fluid. Read by: Narda Rutherford, AGNP-BC Electronically Signed   By: Jerilynn Mages.  Shick M.D.   On: 06/07/2022 15:24     Echo moderately reduced left ventricular function EF of 30 to 35%  TELEMETRY: Atrial fibrillation 75:  ASSESSMENT AND PLAN:  Principal Problem:   Acute on chronic heart failure (HCC) Active Problems:   Atrial fibrillation status post cardioversion 10/22/19 [facility])   Essential hypertension   DM (diabetes mellitus), type 2 (HCC)   Stage 3b chronic kidney disease (CKD) (HCC)   Venous stasis dermatitis of both lower extremities Acute on chronic renal insufficiency  PLan Acute on chronic systolic congestive heart failure still with some volume continue modest diuresis currently on Lasix drip Atrial fibrillation reasonably controlled A-fib rate around 75 Anemia potentially scheduled for GI evaluation and scope today Hypertension reasonably controlled continue current medical therapy .  Diabetes management and control Consider physical therapy occupational therapy to help with strength ambulation and balance Renal insufficiency continue modest hydration avoid nephrotoxic drugs consider nephrology input Cardiomyopathy moderate reasonably stable continue conservative management patient on Coreg Lasix drip spironolactone Imdur consider hydralazine if blood pressure will tolerate  Yolonda Kida,  MD, 06/09/2022 10:47 AM

## 2022-06-09 NOTE — Progress Notes (Signed)
Occupational Therapy Treatment Patient Details Name: Randall Vincent MRN: [id number] DOB: [birth year] Today's Date: 06/09/2022   History of present illness Patient is an 82 year old male who presents to ED for SOB. Patient has had gradually worsening LE swelling that reached to his abdomen. 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   OT comments  Chart reviewed, pt greeted in room agreeable and motivated for OT tx session. Tx session targeted improving functional activity tolerance for improved ADL task completion. Pt amb in hallway with RW approx 100' with supervision, intermittent vcs for ec techniques. Pt endorses continued difficulties with LB dressing, endurance in the setting of prolonged hospitalization, therefore discharge recommendations have been updated to [place]. OT will continue to follow acutely.    Recommendations for follow up therapy are one component of a multi-disciplinary discharge planning process, led by the attending physician.  Recommendations may be updated based on patient status, additional functional criteria and insurance authorization.    Follow Up Recommendations  Home health OT     Assistance Recommended at Discharge Intermittent Supervision/Assistance  Patient can return home with the following  Assistance with cooking/housework;Assist for transportation;Help with stairs or ramp for entrance;A little help with walking and/or transfers;A little help with bathing/dressing/bathroom   Equipment Recommendations  Other (comment) (sock aid)    Recommendations for Other Services      Precautions / Restrictions Precautions Precautions: Fall Restrictions Weight Bearing Restrictions: No       Mobility Bed Mobility Overal bed mobility: Needs Assistance Bed Mobility: Supine to Sit     Supine to sit: Min guard     General bed mobility comments: in recliner post session    Transfers Overall  transfer level: Needs assistance Equipment used: Rolling walker (2 wheels) Transfers: Sit to/from Stand Sit to Stand: Supervision                 Balance Overall balance assessment: Needs assistance Sitting-balance support: No upper extremity supported, Feet supported Sitting balance-Leahy Scale: Good     Standing balance support: Bilateral upper extremity supported Standing balance-Leahy Scale: Good                             ADL either performed or assessed with clinical judgement   ADL Overall ADL's : Needs assistance/impaired Eating/Feeding: Set up;Sitting   Grooming: Set up               Lower Body Dressing: Maximal assistance;Sitting/lateral leans   Toilet Transfer: Supervision/safety;Ambulation;Rolling walker (2 wheels) Toilet Transfer Details (indicate cue type and reason): simulated         Functional mobility during ADLs: Supervision/safety;Rolling walker (2 wheels) (approx 100' in hallway with RW)      Extremity/Trunk Assessment              Vision       Perception     Praxis      Cognition Arousal/Alertness: Awake/alert Behavior During Therapy: WFL for tasks assessed/performed Overall Cognitive Status: Within Functional Limits for tasks assessed                                          Exercises      Shoulder Instructions       General Comments pt on  3 L via [place], spo2 down to 88% during amb, recovered with seated rest break to >90% on 3 L via [place]    Pertinent Vitals/ Pain       Pain Assessment Pain Assessment: No/denies pain  Home Living                                          Prior Functioning/Environment              Frequency  Min 2X/week        Progress Toward Goals  OT Goals(current goals can now be found in the care plan section)  Progress towards OT goals: Progressing toward goals     Plan Frequency remains appropriate;Discharge plan needs to be  updated    Co-evaluation                 AM-PAC OT "6 Clicks" Daily Activity     Outcome Measure   Help from another person eating meals?: None Help from another person taking care of personal grooming?: None Help from another person toileting, which includes using toliet, bedpan, or urinal?: A Little Help from another person bathing (including washing, rinsing, drying)?: A Lot Help from another person to put on and taking off regular upper body clothing?: None Help from another person to put on and taking off regular lower body clothing?: A Lot 6 Click Score: 19    End of Session Equipment Utilized During Treatment: Rolling walker (2 wheels)  OT Visit Diagnosis: Unsteadiness on feet (R26.81);Muscle weakness (generalized) (M62.81)   Activity Tolerance Patient tolerated treatment well   Patient Left in CPM;with call bell/phone within reach;with chair alarm set   Nurse Communication Mobility status        Time: 0086-7619 OT Time Calculation (min): 20 min  Charges: OT General Charges $OT Visit: 1 Visit OT Treatments $Therapeutic Activity: 8-22 mins  Shanon Payor, OTD OTR/L  06/09/22, 12:57 PM

## 2022-06-09 NOTE — Progress Notes (Signed)
GI Inpatient Follow-up Note  Subjective:  Patient seen and states breathing is better but had to be put back on a lasix gtt. Continues to endorse no overt GI bleeding but required one pRBC a few days ago.  Scheduled Inpatient Medications:   amiodarone  200 mg Oral Daily   atorvastatin  40 mg Oral QPM   bisacodyl  5 mg Oral QHS   carvedilol  3.125 mg Oral BID   guaiFENesin  600 mg Oral BID   hydrocerin   Topical BID   insulin aspart  0-15 Units Subcutaneous TID WC   isosorbide mononitrate  30 mg Oral Daily   metolazone  5 mg Oral Once per day on Mon Thu   pantoprazole  40 mg Oral BID AC   polyethylene glycol  17 g Oral Daily   spironolactone  12.5 mg Oral Daily   Vitamin D (Ergocalciferol)  50,000 Units Oral Q7 days    Continuous Inpatient Infusions:    furosemide (LASIX) 200 mg in dextrose 5 % 100 mL (2 mg/mL) infusion 5 mg/hr (06/08/22 2307)    PRN Inpatient Medications:  acetaminophen, benzonatate, diclofenac Sodium, ondansetron (ZOFRAN) IV, mouth rinse  Review of Systems:  Review of Systems  Constitutional:  Negative for fever.  Respiratory:  Positive for shortness of breath.   Cardiovascular:  Negative for chest pain.  Gastrointestinal:  Negative for blood in stool, constipation, melena, nausea and vomiting.  Musculoskeletal:  Positive for joint pain.  Skin:  Negative for rash.  Neurological:  Negative for focal weakness.  Psychiatric/Behavioral:  Negative for substance abuse.   All other systems reviewed and are negative.     Physical Examination: BP (!) 116/57 (BP Location: Left Arm)   Pulse 86   Temp 97.8 F (36.6 C) (Oral)   Resp 18   Ht 5\' 8"  (1.727 m)   Wt 92.3 kg   SpO2 96%   BMI 30.93 kg/m  Gen: Chronically ill appearing HEENT: PEERLA, EOMI, Neck: supple, no JVD or thyromegaly Chest: No respiratory distress Abd: soft, slightly distended Ext: trace edema, well perfused with 2+ pulses, Skin: chronic venous stasis changes Lymph: no  LAD  Data: Lab Results  Component Value Date   WBC 2.5 (L) 06/09/2022   HGB 7.6 (L) 06/09/2022   HCT 25.4 (L) 06/09/2022   MCV 91.0 06/09/2022   PLT 93 (L) 06/09/2022   Recent Labs  Lab 06/07/22 0601 06/08/22 0357 06/09/22 0618  HGB 8.4* 7.6* 7.6*   Lab Results  Component Value Date   NA 139 06/09/2022   K 4.1 06/09/2022   CL 97 (L) 06/09/2022   CO2 34 (H) 06/09/2022   BUN 61 (H) 06/09/2022   CREATININE 1.75 (H) 06/09/2022   Lab Results  Component Value Date   ALT 31 06/08/2022   AST 46 (H) 06/08/2022   ALKPHOS 90 06/08/2022   BILITOT 0.9 06/08/2022   No results for input(s): "APTT", "INR", "PTT" in the last 168 hours. Assessment/Plan: Randall Vincent is a 82 y.o. 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 Fib on DOAC, CKD, hypertension, and HLD here for heart failure exacerbation that's being treated with lasix gtt. We were consulted for anemia and history of IDA. Required another pRBC transfusion a few days ago. Patient still refuses any colonoscopy but is open to EGD  Recommendations:  - risk stratification by cards - will tentatively add for EGD tomorrow but will re-evaluate tomorrow before proceeding with procedure - dc'ed DOAC for now - given no significant improvement in fluid  status, palliative care/hospice needs to be discussed. I spoke with his son also suggesting this and about risks from any procedure  Raylene Miyamoto MD, MPH [place]

## 2022-06-09 NOTE — Progress Notes (Signed)
[facility] Progress Note  Patient: Randall Vincent    DXI:[id number]  DOA: 05/29/2022     Date of Service: the patient was seen and examined on 06/09/2022  Chief Complaint  Patient presents with   Shortness of Breath   Brief hospital course: Randall Vincent is a 82 y.o. 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 ED w/up: Chest x-ray was obtained that demonstrated interstitial edema, mild, with bilateral pleural effusions. Due to failure of outpatient treatment, [facility] contacted for admission for acute on chronic heart failure.    Assessment and Plan:  # Acute on chronic systolic heart failure  Patient has a history of moderately reduced EF with last EF of 45-50%, follows with Dr. Clayborn Bigness.  Despite increasing doses of home torsemide, patient presents with marked pitting edema extending up to the mid abdomen. - S/p Lasix 60 mg IV once and  s/p Lasix 40 mg IV BID - Strict in and out - Daily weights - Continue home GDMT TTE shows LVEF 30 to 76%, grade 3 diastolic dysfunction, right ventricular failure, moderate pulm hypertension, moderate MR and moderate to severe TR Cardiology consulted for further recommendation. Metolazone 2.5 mg p.o. x 1 dose on 1/10 and 1/12  ordered by cardiology 1/11 started spironolactone 12.5 mg p.o. daily as per cardiology On 1/8 started Lasix IV infusion due to anasarca 1/13 DC Lasix infusion, s/p Lasix IV 60 mg x 1 dose, 40 mg x 2 1/14 s/p Lasix 40 mg x BID, 3 more doses and then start torsemide home dose as per cardiology 1/13 Started Imdur 30 mg p.o. daily 1/16 CXR showed pleural effusion more on the right side, patient agreed for thoracentesis, s/p thoracentesis 1 L fluid was tapped.  BNP 1027 elevated 1/16  restarted Lasix IV infusion   Anemia of chronic disease, iron deficiency and occult GI bleeding, unknown source FOBT positive but no overt GI bleeding Discussed with GI, patient is not stable for EGD at this time and no overt GI bleeding, patient refusing colonoscopy.  GI recommended to continue Eliquis for now given benefit overweigh the risk of bleeding 1/15 Hb 7.3, transfuse 1 unit of PRBC 1/16 Hb 8.4, stable, platelet 92k 1/17 Hb 7.6, dropping again patient denies any GI bleeding 1/18 Hb 7.6, remained stable but due to risk of GI bleeding off and on and patient being on Eliquis, would benefit from PRBC transfusion, patient agreed.  So ordered 1 unit of PRBC transfusion today. Monitor H&H and transfuse if hemoglobin less than 8 GI plan noticed for possible EGD tomorrow a.m.   Iron deficiency, iron level 16, transferrin saturation 7% so started Venofer 300 mg IV daily x 3 doses, start oral iron supplement on discharge.  B12 and folate within normal range.  Stage 3b chronic kidney disease (CKD) Patient's renal function frequently fluctuates between 1.6 and 2.2.   Currently 2.18, previously 1.64.  I wonder if there is a component of cardiorenal syndrome.  Will monitor closely while diuresing. - Daily BMP - Monitor urine output   Atrial fibrillation status post cardioversion 10/22/19  Currently rate controlled. Continue home amiodarone 200 POD, Eliquis 2.5 twice daily, and Coreg 3.125 twice daily   Venous stasis dermatitis of both lower extremities Per Dr. Etta Quill most recent note, plans were to  obtain ABIs.  Will do so at this time and if no evidence of PAD, plan to order Unna boots ABI:  Right ABI:  1.47 and Left ABI:  1.55 Right Lower Extremity: Segmental Doppler at the right ankle demonstrates monophasic waveforms Left Lower Extremity: Segmental Doppler at the left ankle demonstrates triphasic posterior tibial and dorsalis pedis waveform. - pt may need Unna boots, will reassess  after diuresis 1/15 apply Eucerin cream bilateral lower extremities due to dryness   Essential hypertension - Continue home antihypertensives   DM (diabetes mellitus), type 2 (HCC) Well-managed with semaglutide with last A1c of 5.9% 4 weeks ago. - Hold home semaglutide while admitted - SSI, moderate   Vitamin D deficiency: started vitamin D 50,000 units p.o. weekly, follow with PCP to repeat vitamin D level after 3 to 6 months.  Body mass index is 35.14 kg/m.  Interventions:     Diet: Heart healthy diet, fluid striction 1.5 L/day DVT Prophylaxis: Therapeutic Anticoagulation with Eliquis    Advance goals of care discussion: DNR  Family Communication: family was not present at bedside, at the time of interview.  The pt provided permission to discuss medical plan with the family. Opportunity was given to ask question and all questions were answered satisfactorily.  GI discussed with patient's son regarding hospice.  Patient's son is also thinking about hospice care and discussed with TOC.   Disposition:  Pt is from Home, admitted with volume overload on IV Lasix infusion, still has anasarca, which precludes a safe discharge. Discharge to home, when clinically stable, may need 1-2 days with IV diuresis. Patient's son discussed with case manager regarding hospice placement, I agreed with hospice because patient is very high risk to deteriorate at any time. Overall long-term prognosis is very poor.  Subjective: No significant overnight events, patient feels improvement in the shortness of breath and edema is also improving, currently patient is on 2 L oxygen via nasal cannula. Patient denies any GI bleeding, hemoglobin 7.6, recommended PRBC transfusion which she agreed.   Physical Exam: General: NAD, lying comfortably, mild SOB Appear in no distress, affect appropriate Eyes: Right eye PERRLA, (left eye blind since childhood) ENT: Oral Mucosa Clear, moist  Neck: no JVD,   Cardiovascular: S1 and S2 Present, no Murmur,  Respiratory: good respiratory effort, Bilateral Air entry equal and Decreased, mild bilateral crackles, no wheezes Abdomen: Bowel Sound present, Soft and no tenderness, abdominal wall edema is improving  Skin: no rashes Extremities: mild Pedal edema, no calf tenderness, chronic venous stasis pigmentation Neurologic: without any new focal findings Gait not checked due to patient safety concerns  Vitals:   06/09/22 1152 06/09/22 1554 06/09/22 1612 06/09/22 1641  BP: (!) 116/57 117/65 106/63 (!) 109/53  Pulse: 86 72 78 75  Resp: 18 (!) 26 19 20   Temp: 97.8 F (36.6 C) 97.8 F (36.6 C) 98 F (36.7 C) 98.3 F (36.8 C)  TempSrc: Oral   Oral  SpO2: 96% 100% 98%   Weight:      Height:        Intake/Output Summary (Last 24 hours) at 06/09/2022 1654 Last data filed at 06/09/2022 1600 Gross per 24 hour  Intake 240 ml  Output 4100 ml  Net -3860 ml   Filed Weights   06/04/22 0800 06/07/22 1039 06/08/22 0757  Weight: 95.9 kg 92.6 kg 92.3 kg    Data Reviewed: I have personally reviewed and interpreted daily labs, tele strips, imagings as discussed above. I reviewed all nursing notes, pharmacy  notes, vitals, pertinent old records I have discussed plan of care as described above with RN and patient/family.  CBC: Recent Labs  Lab 06/05/22 0338 06/06/22 0428 06/07/22 0601 06/08/22 0357 06/09/22 0618  WBC 3.8* 3.1* 3.2* 2.8* 2.5*  HGB 7.8* 7.3* 8.4* 7.6* 7.6*  HCT 25.8* 24.7* 28.4* 25.4* 25.4*  MCV 90.2 90.1 90.2 90.7 91.0  PLT 104* 91* 92* 90* 93*   Basic Metabolic Panel: Recent Labs  Lab 06/03/22 0430 06/04/22 0459 06/05/22 0338 06/06/22 0428 06/07/22 0601 06/08/22 0357 06/09/22 0618  NA 138   < > 136 135 136 138 139  K 3.6   < > 4.2 4.2 4.2 4.2 4.1  CL 97*   < > 94* 94* 93* 97* 97*  CO2 33*   < > 35* 34* 34* 34* 34*  GLUCOSE 89   < > 93 88 85 92 92  BUN 47*   < > 52* 53* 57* 60* 61*  CREATININE 1.90*   < > 2.03*  2.02* 1.85* 1.82* 1.75*  CALCIUM 7.7*   < > 7.6* 7.5* 7.7* 7.6* 7.9*  MG 2.3  --   --  2.2 2.2 2.1 2.1  PHOS 4.0  --   --   --  3.3 2.5 2.0*   < > = values in this interval not displayed.    Studies: No results found.  Scheduled Meds:  sodium chloride   Intravenous Once   amiodarone  200 mg Oral Daily   atorvastatin  40 mg Oral QPM   bisacodyl  5 mg Oral QHS   carvedilol  3.125 mg Oral BID   guaiFENesin  600 mg Oral BID   hydrocerin   Topical BID   insulin aspart  0-15 Units Subcutaneous TID WC   isosorbide mononitrate  30 mg Oral Daily   metolazone  5 mg Oral Once per day on Mon Thu   pantoprazole  40 mg Oral BID AC   polyethylene glycol  17 g Oral Daily   spironolactone  12.5 mg Oral Daily   Vitamin D (Ergocalciferol)  50,000 Units Oral Q7 days   Continuous Infusions:  furosemide (LASIX) 200 mg in dextrose 5 % 100 mL (2 mg/mL) infusion 5 mg/hr (06/08/22 2307)    PRN Meds: acetaminophen, benzonatate, diclofenac Sodium, ondansetron (ZOFRAN) IV, mouth rinse  Time spent: 50 minutes  Author: Val Riles. MD [facility] 06/09/2022 4:54 PM  To reach On-call, see care teams to locate the attending and reach out to them via [email or web address]. If 7PM-7AM, please contact night-coverage If you still have difficulty reaching the attending provider, please page the [facility] (Director on Call) for [facility] on amion for assistance.

## 2022-06-10 ENCOUNTER — Inpatient Hospital Stay: Payer: Medicare Other | Admitting: Certified Registered"

## 2022-06-10 ENCOUNTER — Encounter
Admission: EM | Disposition: A | Discharge status: Hospice | Payer: Self-pay | Source: Home / Self Care | Attending: Student

## 2022-06-10 ENCOUNTER — Encounter: Payer: Self-pay | Admitting: Internal Medicine

## 2022-06-10 HISTORY — PX: ESOPHAGOGASTRODUODENOSCOPY (EGD) WITH PROPOFOL: SHX5813

## 2022-06-10 LAB — BASIC METABOLIC PANEL
Anion gap: 8 (ref 5–15)
BUN: 56 mg/dL — ABNORMAL HIGH (ref 8–23)
CO2: 34 mmol/L — ABNORMAL HIGH (ref 22–32)
Calcium: 8 mg/dL — ABNORMAL LOW (ref 8.9–10.3)
Chloride: 95 mmol/L — ABNORMAL LOW (ref 98–111)
Creatinine, Ser: 1.63 mg/dL — ABNORMAL HIGH (ref 0.61–1.24)
GFR, Estimated: 42 mL/min — ABNORMAL LOW (ref >60)
Glucose, Bld: 95 mg/dL (ref 70–99)
Potassium: 4 mmol/L (ref 3.5–5.1)
Sodium: 137 mmol/L (ref 135–145)

## 2022-06-10 LAB — TYPE AND SCREEN
ABO/RH(D): B POS
Antibody Screen: NEGATIVE
Unit division: 0
Unit division: 0
Unit division: 0
Unit division: 0

## 2022-06-10 LAB — GLUCOSE, CAPILLARY
Glucose-Capillary: 95 mg/dL (ref 70–99)
Glucose-Capillary: 87 mg/dL (ref 70–99)
Glucose-Capillary: 112 mg/dL — ABNORMAL HIGH (ref 70–99)
Glucose-Capillary: 129 mg/dL — ABNORMAL HIGH (ref 70–99)

## 2022-06-10 LAB — CBC
HCT: 28.9 % — ABNORMAL LOW (ref 39.0–52.0)
Hemoglobin: 8.8 g/dL — ABNORMAL LOW (ref 13.0–17.0)
MCH: 27.1 pg (ref 26.0–34.0)
MCHC: 30.4 g/dL (ref 30.0–36.0)
MCV: 88.9 fL (ref 80.0–100.0)
Platelets: 103 10*3/uL — ABNORMAL LOW (ref 150–400)
RBC: 3.25 MIL/uL — ABNORMAL LOW (ref 4.22–5.81)
RDW: 17.2 % — ABNORMAL HIGH (ref 11.5–15.5)
WBC: 4.4 10*3/uL (ref 4.0–10.5)
nRBC: 0 % (ref 0.0–0.2)

## 2022-06-10 LAB — BPAM RBC
Blood Product Expiration Date: 202401262359
Blood Product Expiration Date: 202401262359
Blood Product Expiration Date: 202402142359
Blood Product Expiration Date: 202402142359
ISSUE DATE / TIME: 202401151451
ISSUE DATE / TIME: 202401161326
ISSUE DATE / TIME: 202401181222
ISSUE DATE / TIME: 202401181614
Unit Type and Rh: 7300
Unit Type and Rh: 7300
Unit Type and Rh: 7300
Unit Type and Rh: 7300

## 2022-06-10 LAB — LACTIC ACID, PLASMA: Lactic Acid, Venous: 0.7 mmol/L (ref 0.5–1.9)

## 2022-06-10 SURGERY — ESOPHAGOGASTRODUODENOSCOPY (EGD) WITH PROPOFOL
Anesthesia: General

## 2022-06-10 MED ORDER — APIXABAN 2.5 MG PO TABS
2.5000 mg | ORAL_TABLET | Freq: Two times a day (BID) | ORAL | Status: DC
  Administered 2022-06-11: 2.5 mg via ORAL
  Filled 2022-06-10: qty 1

## 2022-06-10 MED ORDER — PROPOFOL 500 MG/50ML IV EMUL
INTRAVENOUS | Status: DC | PRN
  Administered 2022-06-10: 150 ug/kg/min via INTRAVENOUS

## 2022-06-10 MED ORDER — PROPOFOL 10 MG/ML IV BOLUS
INTRAVENOUS | Status: DC | PRN
  Administered 2022-06-10: 40 mg via INTRAVENOUS

## 2022-06-10 MED ORDER — CARVEDILOL 3.125 MG PO TABS
3.1250 mg | ORAL_TABLET | Freq: Two times a day (BID) | ORAL | Status: DC
  Administered 2022-06-11: 3.125 mg via ORAL
  Filled 2022-06-10: qty 1

## 2022-06-10 MED ORDER — SODIUM CHLORIDE 0.9 % IV SOLN: INTRAVENOUS | Status: DC

## 2022-06-10 MED ORDER — TORSEMIDE 20 MG PO TABS: 20.0000 mg | ORAL_TABLET | Freq: Two times a day (BID) | ORAL | Status: DC

## 2022-06-10 NOTE — Progress Notes (Signed)
[facility]    SUBJECTIVE: Patient still feels weak fatigued no energy denies any significant pain resting comfortably in bed   Vitals:   06/10/22 0032 06/10/22 0352 06/10/22 0821 06/10/22 1226  BP: (!) 102/53 (!) 113/50 137/69 102/60  Pulse: 78 76 91 98  Resp: 17 16 20 17   Temp: 97.6 F (36.4 C) 98 F (36.7 C) 97.8 F (36.6 C) 97.9 F (36.6 C)  TempSrc: Axillary Oral Oral Oral  SpO2: 100% 99% 91% 96%  Weight:      Height:         Intake/Output Summary (Last 24 hours) at 06/10/2022 1324 Last data filed at 06/10/2022 1229 Gross per 24 hour  Intake 594.17 ml  Output 5325 ml  Net -4730.83 ml      PHYSICAL EXAM  General: Well developed, well nourished, in no acute distress HEENT:  Normocephalic and atramatic Neck:  No JVD.  Lungs: Clear bilaterally to auscultation and percussion. Heart: Irregular irregular. Normal S1 and S2 without gallops or murmurs.  Abdomen: Bowel sounds are positive, abdomen soft and non-tender  Msk:  Back normal, normal gait. Normal strength and tone for age. Extremities: No clubbing, cyanosis or edema.   Neuro: Alert and oriented X 3. Psych:  Good affect, responds appropriately   LABS: Basic Metabolic Panel: Recent Labs    06/08/22 0357 06/09/22 0618 06/10/22 0459  NA 138 139 137  K 4.2 4.1 4.0  CL 97* 97* 95*  CO2 34* 34* 34*  GLUCOSE 92 92 95  BUN 60* 61* 56*  CREATININE 1.82* 1.75* 1.63*  CALCIUM 7.6* 7.9* 8.0*  MG 2.1 2.1  --   PHOS 2.5 2.0*  --    Liver Function Tests: Recent Labs    06/08/22 1436  AST 46*  ALT 31  ALKPHOS 90  BILITOT 0.9  PROT 6.1*  ALBUMIN 2.6*   No results for input(s): "LIPASE", "AMYLASE" in the last 72 hours. CBC: Recent Labs    06/09/22 0618 06/10/22 0459  WBC 2.5* 4.4  HGB 7.6* 8.8*  HCT 25.4* 28.9*  MCV 91.0 88.9  PLT 93* 103*   Cardiac Enzymes: No results for input(s): "CKTOTAL", "CKMB", "CKMBINDEX", "TROPONINI" in the last 72 hours. BNP: Invalid input(s):  "POCBNP" D-Dimer: No results for input(s): "DDIMER" in the last 72 hours. Hemoglobin A1C: No results for input(s): "HGBA1C" in the last 72 hours. Fasting Lipid Panel: No results for input(s): "CHOL", "HDL", "LDLCALC", "TRIG", "CHOLHDL", "LDLDIRECT" in the last 72 hours. Thyroid Function Tests: No results for input(s): "TSH", "T4TOTAL", "T3FREE", "THYROIDAB" in the last 72 hours.  Invalid input(s): "FREET3" Anemia Panel: No results for input(s): "VITAMINB12", "FOLATE", "FERRITIN", "TIBC", "IRON", "RETICCTPCT" in the last 72 hours.  No results found.   Echo moderately depressed left ventricular function EF around 30 to 35%  TELEMETRY: Atrial fibrillation rate of 75  ASSESSMENT AND PLAN:  Principal Problem:   Acute on chronic heart failure (HCC) Active Problems:   Atrial fibrillation status post cardioversion 10/22/19 [facility])   Essential hypertension   DM (diabetes mellitus), type 2 (HCC)   Stage 3b chronic kidney disease (CKD) ([place])   Venous stasis dermatitis of both lower extremities    Plan Atrial fibrillation chronic continue current therapy poor anticoagulation candidate because of bleeding and anemia continue rate control Hypertension with recent hypotension continue current therapy Agree with mild fluid resuscitation and hydration Acute on chronic renal insufficiency 3B continue mild hydration avoid nephrotoxic drugs GI bleeding with anemia recommend GI evaluation including scope  Patient is at least a moderate risk for EGD colonoscopy but is acceptable with his continuing condition and bleeding based on risk-benefit Diabetes type 2 continue current management Generalized weakness with consider physical and Occupational Therapy   Yolonda Kida, MD, 06/10/2022 1:24 PM

## 2022-06-10 NOTE — TOC Progression Note (Signed)
Transition of Care [facility]) - Progression Note    Patient Details  Name: Randall Vincent MRN: [id number] Date of Birth: [birth year]  Transition of Care [facility]) CM/SW Contact  Laurena Slimmer, RN Phone Number: 06/10/2022, 11:09 AM  Clinical Narrative:    Case reviewed for changes in disposition. Patient and family considering hospice.    Expected Discharge Plan: OP Rehab Barriers to Discharge: Continued Medical Work up  Expected Discharge Plan and Services     Post Acute Care Choice:  (Outpatient PT) Living arrangements for the past 2 months: Mobile Home                                       Social Determinants of Health (SDOH) Interventions [place]: No Food Insecurity (05/30/2022)  Housing: Low Risk  (05/30/2022)  Transportation Needs: No Transportation Needs (05/30/2022)  Utilities: Not At Risk (05/30/2022)  Tobacco Use: Medium Risk (05/30/2022)    Readmission Risk Interventions    06/01/2022    2:17 PM  Readmission Risk Prevention Plan  Transportation Screening Complete  PCP or Specialist Appt within 3-5 Days Complete  Social Work Consult for [place] Planning/Counseling Complete  Palliative Care Screening Not Applicable  Medication Review Press photographer) Complete

## 2022-06-10 NOTE — Anesthesia Preprocedure Evaluation (Addendum)
Anesthesia Evaluation  Patient identified by MRN, date of birth, ID band Patient awake    Reviewed: Allergy & Precautions, NPO status , Patient's Chart, lab work & pertinent test results  History of Anesthesia Complications Negative for: history of anesthetic complications  Airway Mallampati: III  TM Distance: >3 FB Neck ROM: full    Dental  (+) Edentulous Upper, Edentulous Lower   Pulmonary sleep apnea , former smoker Very wet sounding cough. S/p 1L thoracentesis yesterday    + decreased breath sounds  rales    Cardiovascular Exercise Tolerance: Poor hypertension, pulmonary hypertension(-) angina + CAD, + Past MI, + CABG, +CHF (acute heart failure exacerbation) and + DOE  + dysrhythmias Atrial Fibrillation + Valvular Problems/Murmurs (TR) MR  Rhythm:Regular Rate:Normal  Per cardiology, he is at least moderate risk, but optimized.  Pulmonary HTN   ECHO IMPRESSIONS     1. Left ventricular ejection fraction, by estimation, is 30 to 35%. The  left ventricle has moderately decreased function. Left ventricular  endocardial border not optimally defined to evaluate regional wall motion.  The left ventricular internal cavity  size was mildly dilated. There is moderate left ventricular hypertrophy.  Left ventricular diastolic parameters are consistent with Grade III  diastolic dysfunction (restrictive).   2. Right ventricular systolic function is severely reduced. The right  ventricular size is mildly enlarged. There is moderately elevated  pulmonary artery systolic pressure.   3. Left atrial size was moderately dilated.   4. Right atrial size was moderately dilated.   5. The mitral valve is abnormal. Moderate mitral valve regurgitation. No  evidence of mitral stenosis. Moderate mitral annular calcification.   6. Tricuspid valve regurgitation is moderate to severe.   7. The aortic valve is normal in structure. Aortic valve  regurgitation is  not visualized. Aortic valve sclerosis is present, with no evidence of  aortic valve stenosis.   8. Moderately dilated pulmonary artery.   9. The inferior vena cava is dilated in size with <50% respiratory  variability, suggesting right atrial pressure of 15 mmHg.     Neuro/Psych negative neurological ROS  negative psych ROS   GI/Hepatic negative GI ROS, Neg liver ROS,,,  Endo/Other  diabetes    Renal/GU Renal disease  negative genitourinary   Musculoskeletal   Abdominal   Peds  Hematology negative hematology ROS (+)   Anesthesia Other Findings Past Medical History: No date: A-fib [facility])     Comment:  a.) CHA2DS2-VASc = 6 (age x 2, CHF, HTN, previous MI,               T2DM). b.) Rate/rhythm maintained on amiodarone +               diltiazem + carvedilol; chronically anticoagulated using               dabigatran. c.) DCCV 07/10/2015, 08/08/2019, 10/22/2019. No date: Arthritis 06/26/2019: Chest pain No date: CHF (congestive heart failure) (HCC) No date: CKD (chronic kidney disease), stage III (HCC) 1991: Coronary artery disease     Comment:  a.) 2v CABG in 1991. b.) PCI 06/07/2006 --> [facility]               stents to native LCx and RCA No date: Hyperlipidemia No date: Hypertension No date: Long term current use of anticoagulant     Comment:  a.) dabigatran 1991: Myocardial infarction (HCC)     Comment:  a.) LHC --> 50% and 90% LAD lesions; referred to CVTS.  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     Comment:  a.) LIMA-LAD, SVG-RCA No date: T2DM (type 2 diabetes mellitus) ([place])  Past Surgical History: 1970: BACK SURGERY     Comment:  L5-L6 ruptured disk 2000: BLADDER SURGERY 07/10/2015: CARDIOVERSION; N/A 08/08/2019: CARDIOVERSION; N/A 10/22/2019: CARDIOVERSION; N/A No date: COLON SURGERY 01/15/2020: COLONOSCOPY; N/A     Comment:  Procedure: COLONOSCOPY;  Surgeon:  Virgel Manifold,               MD;  Location: [facility] ENDOSCOPY;  Service: Endoscopy;                Laterality: N/A; 01/16/2020: COLONOSCOPY; N/A     Comment:  Procedure: COLONOSCOPY;  Surgeon: Virgel Manifold,               MD;  Location: [facility] ENDOSCOPY;  Service: Endoscopy;                Laterality: N/A; 01/02/1990: CORONARY ARTERY BYPASS GRAFT; N/A     Comment:  Procedure: 2v CORONARY ARTERY BYPASS GRAFT (LIMA-LAD,               SVG-RCA) 01/13/2020: ESOPHAGOGASTRODUODENOSCOPY; N/A     Comment:  Procedure: ESOPHAGOGASTRODUODENOSCOPY (EGD);  Surgeon:               Virgel Manifold, MD;  Location: [facility] ENDOSCOPY;                Service: Endoscopy;  Laterality: N/A; 2008: REPLACEMENT TOTAL KNEE BILATERAL; Bilateral 2008: STENT PLACEMENT VASCULAR ([place] HX) 01/16/2020: TOTAL KNEE REVISION; Left     Comment:  Procedure: TOTAL KNEE REVISION;  Surgeon: Lovell Sheehan, MD;  Location: [facility] ORS;  Service: Orthopedics;                Laterality: Left; 05/31/2021: TOTAL KNEE REVISION; Left     Comment:  Procedure: TOTAL KNEE REVISION;  Surgeon: Lovell Sheehan, MD;  Location: [facility] ORS;  Service: Orthopedics;                Laterality: Left;  BMI    Body Mass Index: 30.93 kg/m      Reproductive/Obstetrics negative OB ROS                             Anesthesia Physical Anesthesia Plan  ASA: 4  Anesthesia Plan: General   Post-op Pain Management: Minimal or no pain anticipated   Induction: Intravenous  PONV Risk Score and Plan: 2 and Propofol infusion and TIVA  Airway Management Planned: Natural Airway and Nasal Cannula  Additional Equipment: None  Intra-op Plan:   Post-operative Plan:   Informed Consent: I have reviewed the patients History and Physical, chart, labs and discussed the procedure including the risks, benefits and alternatives for the proposed anesthesia with the patient or authorized representative  who has indicated his/her understanding and acceptance.     Dental Advisory Given  Plan Discussed with: Anesthesiologist, CRNA and Surgeon  Anesthesia Plan Comments: (Patient consented for risks of anesthesia including but not limited to:  - adverse reactions to medications - risk of airway placement if required - damage to eyes, teeth, lips or other oral mucosa - nerve damage due  to positioning  - sore throat or hoarseness - Damage to heart, brain, nerves, lungs, other parts of body or loss of life  Patient voiced understanding.)       Anesthesia Quick Evaluation

## 2022-06-10 NOTE — Care Management Important Message (Signed)
Important Message  Patient Details  Name: Randall Vincent MRN: [id number] Date of Birth: Jun 14, 1940   Medicare Important Message Given:  Yes     Dannette Barbara 06/10/2022, 12:02 PM

## 2022-06-10 NOTE — Progress Notes (Signed)
PT cancellation:  Pt noted to be off the floor for procedure. Will f/u as able.  Lavone Nian, PT, DPT 06/10/22, 2:05 PM

## 2022-06-10 NOTE — Transfer of Care (Signed)
Immediate Anesthesia Transfer of Care Note  Patient: Randall Vincent  Procedure(s) Performed: ESOPHAGOGASTRODUODENOSCOPY (EGD) WITH PROPOFOL  Patient Location: PACU  Anesthesia Type:MAC  Level of Consciousness: awake  Airway & Oxygen Therapy: Patient Spontanous Breathing and Patient connected to face mask oxygen  Post-op Assessment: Report given to RN and Post -op Vital signs reviewed and stable  Post vital signs: Reviewed and stable  Last Vitals:  Vitals Value Taken Time  BP    Temp    Pulse    Resp    SpO2      Last Pain:  Vitals:   06/10/22 1403  TempSrc: Temporal  PainSc: 0-No pain      Patients Stated Pain Goal: 0 (11/88/67 7373)  Complications: No notable events documented.

## 2022-06-10 NOTE — Op Note (Signed)
[facility] Patient Name: Randall Vincent Procedure Date: 06/10/2022 2:24 PM MRN: [id number] Account #: [id number] Date of Birth: Nov 06, 1940 Admit Type: Inpatient Age: 82 Room: [facility] ENDO ROOM 3 Gender: Male Note Status: Finalized Instrument Name: Michaelle Birks [id number] Procedure:             Upper GI endoscopy Indications:           Gastrointestinal bleeding of unknown origin Providers:             Andrey Farmer MD, MD Referring MD:          Elio Forget MD, MD (Referring MD) Medicines:             Monitored Anesthesia Care Complications:         No immediate complications. Procedure:             Pre-Anesthesia Assessment:                        - Prior to the procedure, a History and Physical was                         performed, and patient medications and allergies were                         reviewed. The patient is competent. The risks and                         benefits of the procedure and the sedation options and                         risks were discussed with the patient. All questions                         were answered and informed consent was obtained.                         Patient identification and proposed procedure were                         verified by the physician, the nurse, the                         anesthesiologist, the anesthetist and the technician                         in the endoscopy suite. Mental Status Examination:                         alert and oriented. Airway Examination: normal                         oropharyngeal airway and neck mobility. Respiratory                         Examination: clear to auscultation. CV Examination:                         normal. Prophylactic Antibiotics: The patient does not  require prophylactic antibiotics. Prior                         Anticoagulants: The patient has taken Eliquis                         (apixaban), last dose was 1 day  prior to procedure.                         ASA Grade Assessment: IV - A patient with severe                         systemic disease that is a constant threat to life.                         After reviewing the risks and benefits, the patient                         was deemed in satisfactory condition to undergo the                         procedure. The anesthesia plan was to use monitored                         anesthesia care (MAC). Immediately prior to                         administration of medications, the patient was                         re-assessed for adequacy to receive sedatives. The                         heart rate, respiratory rate, oxygen saturations,                         blood pressure, adequacy of pulmonary ventilation, and                         response to care were monitored throughout the                         procedure. The physical status of the patient was                         re-assessed after the procedure.                        After obtaining informed consent, the endoscope was                         passed under direct vision. Throughout the procedure,                         the patient's blood pressure, pulse, and oxygen                         saturations were monitored continuously. The Endoscope  was introduced through the mouth, and advanced to the                         second part of duodenum. The upper GI endoscopy was                         accomplished without difficulty. The patient tolerated                         the procedure well. Findings:      The examined esophagus was normal.      The entire examined stomach was normal.      The examined duodenum was normal. Impression:            - Normal esophagus.                        - Normal stomach.                        - Normal examined duodenum.                        - No specimens collected. Recommendation:        - Return patient to hospital  ward for ongoing care.                        - Advance diet as tolerated.                        - Continue present medications.                        - Recommend palliative care discussions about                         potentially hospice as patient and family are                         interested in more discussions. Patient refuses                         colonoscopy which he would be very high risk to do. In                         terms of anticoagulation, from a GI perspective he                         could restart but if elects hospice, may elect to hold                         it all together. Procedure Code(s):     --- Professional ---                        [phone], Esophagogastroduodenoscopy, flexible,                         transoral; diagnostic, including collection of  specimen(s) by brushing or washing, when performed                         (separate procedure) Diagnosis Code(s):     --- Professional ---                        K92.2, Gastrointestinal hemorrhage, unspecified CPT copyright 2022 [employer]. All rights reserved. The codes documented in this report are preliminary and upon coder review may  be revised to meet current compliance requirements. Andrey Farmer MD, MD 06/10/2022 2:48:45 PM Number of Addenda: 0 Note Initiated On: 06/10/2022 2:24 PM Estimated Blood Loss:  Estimated blood loss: none.      [facility]

## 2022-06-10 NOTE — Progress Notes (Signed)
[facility] Progress Note  Patient: Randall Vincent    XIP:[id number]  DOA: 05/29/2022     Date of Service: the patient was seen and examined on 06/10/2022  Chief Complaint  Patient presents with   Shortness of Breath   Brief hospital course: Serjio Deupree is a 82 y.o. 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 ED w/up: Chest x-ray was obtained that demonstrated interstitial edema, mild, with bilateral pleural effusions. Due to failure of outpatient treatment, [facility] contacted for admission for acute on chronic heart failure.    Assessment and Plan:  # Acute on chronic systolic heart failure  Patient has a history of moderately reduced EF with last EF of 45-50%, follows with Dr. Clayborn Bigness.  Despite increasing doses of home torsemide, patient presents with marked pitting edema extending up to the mid abdomen. - S/p Lasix 60 mg IV once and  s/p Lasix 40 mg IV BID - Strict in and out - Daily weights - Continue home GDMT TTE shows LVEF 30 to 67%, grade 3 diastolic dysfunction, right ventricular failure, moderate pulm hypertension, moderate MR and moderate to severe TR Cardiology consulted for further recommendation. Metolazone 2.5 mg p.o. x 1 dose on 1/10 and 1/12  ordered by cardiology 1/11 started spironolactone 12.5 mg p.o. daily as per cardiology On 1/8 started Lasix IV infusion due to anasarca 1/13 DC Lasix infusion, s/p Lasix IV 60 mg x 1 dose, 40 mg x 2 1/14 s/p Lasix 40 mg x BID, 3 more doses and then start torsemide home dose as per cardiology 1/13 Started Imdur 30 mg p.o. daily 1/16 CXR showed pleural effusion more on the right side, patient agreed for thoracentesis, s/p thoracentesis 1 L fluid was tapped.  BNP 1027 elevated 1/16  restarted Lasix IV infusion and DC'd on 1/19, resume torsemide 20 mg p.o. twice daily on 1/20   Anemia of chronic disease, iron deficiency and occult GI bleeding, unknown source FOBT positive but no overt GI bleeding Discussed with GI, patient is not stable for EGD at this time and no overt GI bleeding, patient refusing colonoscopy.  GI recommended to continue Eliquis for now given benefit overweigh the risk of bleeding 1/15 Hb 7.3, transfuse 1 unit of PRBC 1/16 Hb 8.4, stable, platelet 92k 1/17 Hb 7.6, dropping again patient denies any GI bleeding 1/18 Hb 7.6, remained stable but due to risk of GI bleeding off and on and patient being on Eliquis, would benefit from PRBC transfusion, patient agreed.  So ordered 1 unit of PRBC transfusion.  1/19 Hb 8.8 posttransfusion, GI consulted s/p EGD, no source of bleeding.  GI recommended hospice care. Monitor H&H and transfuse if hemoglobin less than 8   Iron deficiency, iron level 16, transferrin saturation 7% so started Venofer 300 mg IV daily x 3 doses, start oral iron supplement on discharge.  B12 and folate within normal range.  Stage 3b chronic kidney disease (CKD) Patient's renal function frequently fluctuates between 1.6 and 2.2.   Currently 2.18, previously 1.64.  I wonder if there is a component of cardiorenal syndrome.  Will monitor closely while diuresing. - Daily BMP - Monitor urine output   Atrial fibrillation status post cardioversion 10/22/19  Currently rate controlled. Continue home amiodarone 200 POD, Eliquis 2.5 twice daily, and  Coreg 3.125 twice daily   Venous stasis dermatitis of both lower extremities Per Dr. Etta Quill most recent note, plans were to obtain ABIs.  Will do so at this time and if no evidence of PAD, plan to order Unna boots ABI:  Right ABI:  1.47 and Left ABI:  1.55 Right Lower Extremity: Segmental Doppler at the right ankle demonstrates monophasic waveforms Left Lower Extremity: Segmental Doppler at the left  ankle demonstrates triphasic posterior tibial and dorsalis pedis waveform. - pt may need Unna boots, will reassess after diuresis 1/15 apply Eucerin cream bilateral lower extremities due to dryness   Essential hypertension - Continue home antihypertensives Blood pressure remained soft, continue current medication with holding parameters. Monitor BP and titrate medications accordingly.   DM (diabetes mellitus), type 2 ([place]) Well-managed with semaglutide with last A1c of 5.9% 4 weeks ago. - Hold home semaglutide while admitted - SSI, moderate   Vitamin D deficiency: started vitamin D 50,000 units p.o. weekly, follow with PCP to repeat vitamin D level after 3 to 6 months.  Body mass index is 35.14 kg/m.  Interventions:     Diet: Heart healthy diet, fluid striction 1.5 L/day DVT Prophylaxis: Therapeutic Anticoagulation with Eliquis    Advance goals of care discussion: DNR  Family Communication: family was not present at bedside, at the time of interview.  The pt provided permission to discuss medical plan with the family. Opportunity was given to ask question and all questions were answered satisfactorily.    Disposition:  Pt is from Home, admitted with volume overload, s/p IV Lasix infusion, anasarca improved.  Persistent anemia received multiple blood transfusion, s/p EGD, no source of bleeding. Discussed with patient's son, plan is to discharge him tomorrow a.m. Patient and his son will consider palliative and hospice care at home. Discharge tomorrow a.m. with home health services.   Subjective: No significant overnight events, patient feels improvement in the shortness of breath and edema is also improving, currently patient is on 2 L oxygen via nasal cannula. Patient was n.p.o. and awaiting for EGD in the morning.  Denies any active issues.  Physical Exam: General: NAD, lying comfortably, mild SOB Appear in no distress, affect appropriate Eyes: Right eye PERRLA, (left  eye blind since childhood) ENT: Oral Mucosa Clear, moist  Neck: no JVD,  Cardiovascular: S1 and S2 Present, no Murmur,  Respiratory: good respiratory effort, Bilateral Air entry equal and Decreased, mild bilateral crackles, no wheezes Abdomen: Bowel Sound present, Soft and no tenderness, abdominal wall edema is improving  Skin: no rashes Extremities: mild Pedal edema, no calf tenderness, chronic venous stasis pigmentation Neurologic: without any new focal findings Gait not checked due to patient safety concerns  Vitals:   06/10/22 1500 06/10/22 1502 06/10/22 1506 06/10/22 1510  BP: (!) 76/49 (!) 73/52 (!) 113/58 116/61  Pulse: 84 88 88 87  Resp: 16 (!) 32 (!) 29 (!) 25  Temp:      TempSrc:      SpO2: 97% 96% 93% 95%  Weight:      Height:        Intake/Output Summary (Last 24 hours) at 06/10/2022 1620 Last data filed at 06/10/2022 1229 Gross per 24 hour  Intake 594.17 ml  Output 4600 ml  Net -4005.83 ml   Filed Weights   06/04/22 0800 06/07/22 1039 06/08/22 0757  Weight: 95.9 kg 92.6 kg 92.3 kg    Data Reviewed: I have personally reviewed and interpreted daily labs, tele strips, imagings as discussed above. I  reviewed all nursing notes, pharmacy notes, vitals, pertinent old records I have discussed plan of care as described above with RN and patient/family.  CBC: Recent Labs  Lab 06/06/22 0428 06/07/22 0601 06/08/22 0357 06/09/22 0618 06/10/22 0459  WBC 3.1* 3.2* 2.8* 2.5* 4.4  HGB 7.3* 8.4* 7.6* 7.6* 8.8*  HCT 24.7* 28.4* 25.4* 25.4* 28.9*  MCV 90.1 90.2 90.7 91.0 88.9  PLT 91* 92* 90* 93* 103*   Basic Metabolic Panel: Recent Labs  Lab 06/06/22 0428 06/07/22 0601 06/08/22 0357 06/09/22 0618 06/10/22 0459  NA 135 136 138 139 137  K 4.2 4.2 4.2 4.1 4.0  CL 94* 93* 97* 97* 95*  CO2 34* 34* 34* 34* 34*  GLUCOSE 88 85 92 92 95  BUN 53* 57* 60* 61* 56*  CREATININE 2.02* 1.85* 1.82* 1.75* 1.63*  CALCIUM 7.5* 7.7* 7.6* 7.9* 8.0*  MG 2.2 2.2 2.1 2.1  --    PHOS  --  3.3 2.5 2.0*  --     Studies: No results found.  Scheduled Meds:  [MAR Hold] sodium chloride   Intravenous Once   [MAR Hold] amiodarone  200 mg Oral Daily   [MAR Hold] atorvastatin  40 mg Oral QPM   [MAR Hold] bisacodyl  5 mg Oral QHS   [MAR Hold] carvedilol  3.125 mg Oral BID   [MAR Hold] guaiFENesin  600 mg Oral BID   [MAR Hold] hydrocerin   Topical BID   [MAR Hold] insulin aspart  0-15 Units Subcutaneous TID WC   [MAR Hold] isosorbide mononitrate  30 mg Oral Daily   [MAR Hold] metolazone  5 mg Oral Once per day on Mon Thu   [facility] Hold] pantoprazole  40 mg Oral BID AC   [MAR Hold] polyethylene glycol  17 g Oral Daily   [MAR Hold] spironolactone  12.5 mg Oral Daily   [MAR Hold] Vitamin D (Ergocalciferol)  50,000 Units Oral Q7 days   Continuous Infusions:  sodium chloride 20 mL/hr at 06/10/22 1434   furosemide (LASIX) 200 mg in dextrose 5 % 100 mL (2 mg/mL) infusion 5 mg/hr (06/08/22 2307)    PRN Meds: [MAR Hold] acetaminophen, [MAR Hold] benzonatate, [MAR Hold] diclofenac Sodium, [MAR Hold] ondansetron (ZOFRAN) IV, [MAR Hold] mouth rinse  Time spent: 50 minutes  Author: Gillis Santa. MD [facility] 06/10/2022 4:20 PM  To reach On-call, see care teams to locate the attending and reach out to them via [email or web address]. If 7PM-7AM, please contact night-coverage If you still have difficulty reaching the attending provider, please page the [facility] (Director on Call) for [facility] on amion for assistance.

## 2022-06-10 NOTE — OR Nursing (Signed)
Notified dr Bonna Gains regarding low bp, repositioned cuff, repositioned patient in the bed. Able to cough up mucus. Bp increased to 113/58

## 2022-06-11 LAB — CBC
HCT: 27.4 % — ABNORMAL LOW (ref 39.0–52.0)
Hemoglobin: 8.4 g/dL — ABNORMAL LOW (ref 13.0–17.0)
MCH: 27.7 pg (ref 26.0–34.0)
MCHC: 30.7 g/dL (ref 30.0–36.0)
MCV: 90.4 fL (ref 80.0–100.0)
Platelets: 102 10*3/uL — ABNORMAL LOW (ref 150–400)
RBC: 3.03 MIL/uL — ABNORMAL LOW (ref 4.22–5.81)
RDW: 17.2 % — ABNORMAL HIGH (ref 11.5–15.5)
WBC: 3.8 10*3/uL — ABNORMAL LOW (ref 4.0–10.5)
nRBC: 0 % (ref 0.0–0.2)

## 2022-06-11 LAB — BASIC METABOLIC PANEL
Anion gap: 6 (ref 5–15)
BUN: 61 mg/dL — ABNORMAL HIGH (ref 8–23)
CO2: 37 mmol/L — ABNORMAL HIGH (ref 22–32)
Calcium: 8 mg/dL — ABNORMAL LOW (ref 8.9–10.3)
Chloride: 95 mmol/L — ABNORMAL LOW (ref 98–111)
Creatinine, Ser: 1.9 mg/dL — ABNORMAL HIGH (ref 0.61–1.24)
GFR, Estimated: 35 mL/min — ABNORMAL LOW (ref >60)
Glucose, Bld: 101 mg/dL — ABNORMAL HIGH (ref 70–99)
Potassium: 3.9 mmol/L (ref 3.5–5.1)
Sodium: 138 mmol/L (ref 135–145)

## 2022-06-11 LAB — GLUCOSE, CAPILLARY
Glucose-Capillary: 79 mg/dL (ref 70–99)
Glucose-Capillary: 153 mg/dL — ABNORMAL HIGH (ref 70–99)

## 2022-06-11 LAB — BODY FLUID CULTURE W GRAM STAIN: Culture: NO GROWTH

## 2022-06-11 MED ORDER — TORSEMIDE 20 MG PO TABS: 20.0000 mg | ORAL_TABLET | Freq: Two times a day (BID) | ORAL | Status: DC

## 2022-06-11 MED ORDER — CARVEDILOL 3.125 MG PO TABS: 3.1250 mg | ORAL_TABLET | Freq: Two times a day (BID) | ORAL | 2 refills | Status: DC

## 2022-06-11 MED ORDER — SPIRONOLACTONE 12.5 MG HALF TABLET: 12.5000 mg | ORAL_TABLET | Freq: Every day | ORAL | Status: DC

## 2022-06-11 MED ORDER — GUAIFENESIN-DM 100-10 MG/5ML PO SYRP: 5.0000 mL | ORAL_SOLUTION | ORAL | 0 refills | Status: DC | PRN

## 2022-06-11 MED ORDER — PANTOPRAZOLE SODIUM 40 MG PO TBEC: 40.0000 mg | DELAYED_RELEASE_TABLET | Freq: Every day | ORAL | 2 refills | Status: DC

## 2022-06-11 NOTE — Discharge Summary (Signed)
[facility] Discharge Summary   Patient: Randall Vincent NWG:[id number]  PCP: Elspeth Choerrell, Grace E., MD  Date of admission: 05/29/2022   Date of discharge:  06/11/2022     Discharge Diagnoses:  Principal Problem:   Acute on chronic heart failure [facility](HCC) Active Problems:   Stage 3b chronic kidney disease (CKD) (HCC)   Atrial fibrillation status post cardioversion 10/22/19 (HCC)   Venous stasis dermatitis of both lower extremities   Essential hypertension   DM (diabetes mellitus), type 2 (HCC)   Admitted From: Home Disposition:  Home with Hospice  Recommendations for Outpatient Follow-up:  F/u with hospice care Follow up LABS/TEST: None   Follow-up Information     Alwyn Peaallwood, Dwayne D, MD. Go in 1 week(s).   Specialties: Cardiology, Internal Medicine Contact information: [address] [place] [address] [phone]                Diet recommendation: Carb modified diet, fluid striction 1.5 L/day  Activity: The patient is advised to gradually reintroduce usual activities, as tolerated  Discharge Condition: stable  Code Status: DNR   History of present illness: As per the H and P dictated on admission Hospital Course:  Randall HaysGeorge Stankovich is a 82 y.o. 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 ED w/up: Chest x-ray was obtained that demonstrated interstitial edema, mild, with bilateral pleural effusions. Due to failure of outpatient treatment, [facility] contacted for admission for acute on chronic heart failure.   Assessment and Plan: # Acute on chronic systolic heart failure  Patient has a history of moderately reduced EF with last EF of 45-50%, follows with Dr. Juliann Paresallwood.  Despite increasing doses of home torsemide, patient presents with  marked pitting edema extending up to the mid abdomen. BNP 1027 elevated. TTE shows LVEF 30 to 35%, grade 3 diastolic dysfunction, right ventricular failure, moderate pulm hypertension, moderate MR and moderate to severe TR  Cardiology was consulted, patient was placed on IV Lasix infusion, discontinued on 06/10/2022, resumed torsemide 20 mg p.o. twice daily home dose, recommended fluid striction 1.5 L/day.  Discontinued spironolactone and Imdur, patient would like to be under hospice care at home.  # Anemia of chronic disease, iron deficiency and occult GI bleeding, unknown source. FOBT positive but no overt GI bleeding Discussed with GI, patient is not stable for EGD at this time and no overt GI bleeding, patient refusing colonoscopy.  GI recommended to continue Eliquis for now given benefit overweigh the risk of bleeding. On 1/15 Hb 7.3, transfuse 1 unit of PRBC 1/16 Hb 8.4, stable, platelet 92k. On 1/17 Hb 7.6, dropping again patient denies any GI bleeding. On 1/18 Hb 7.6, remained stable but due to risk of GI bleeding off and on and patient being on Eliquis, would benefit from PRBC transfusion, patient agreed.  So ordered 1 unit of PRBC transfusion. On 1/19 Hb 8.8 posttransfusion, started pantoprazole 40 mg p.o. daily.  GI consulted s/p EGD, no source of bleeding.  GI recommended hospice care.  # Iron deficiency, iron level 16, transferrin saturation 7% so started Venofer 300 mg IV daily x 3 doses. B12 and folate within normal range.  No more need of iron as patient is under hospice care now. # Stage 3b chronic kidney disease: Patient's renal function frequently  fluctuates between 1.6 and 2.2.  Currently 2.18--1.9.  No more need of monitoring as patient is going under hospice care. # Atrial fibrillation status post cardioversion 10/22/19, Continue home amiodarone 200 POD, decreased dose of Coreg 3.125 twice daily.  Continued Eliquis. # Venous stasis dermatitis of both lower extremities. Per Dr. Glennis Brinkallwood's  most recent note, plans were to obtain ABIs.  Will do so at this time and if no evidence of PAD, plan to order Unna boots. ABI:  Right ABI:  1.47 and Left ABI:  1.55 Right Lower Extremity: Segmental Doppler at the right ankle demonstrates monophasic waveforms.  Left Lower Extremity: Segmental Doppler at the left ankle demonstrates triphasic posterior tibial and dorsalis pedis waveform. S/p Eucerin cream bilateral lower extremities due to dryness.  No further intervention needed. # Essential hypertension, blood pressure remained soft, decrease dose of Coreg.  It can be DC'd since patient is under hospice care. # DM (diabetes mellitus), type 2, Well-managed with semaglutide with last A1c of 5.9% 4 weeks ago. Resumed same on discharge, continue diabetic diet. # Vitamin D deficiency: s/p vitamin D 50,000 units p.o. weekly given during hospital stay.,  No more needs due to home hospice status.  Body mass index is 27.54 kg/m.  Nutrition Interventions:   Pressure Injury 05/31/22 Perineum Bilateral Deep Tissue Pressure Injury - Purple or maroon localized area of discolored intact skin or blood-filled blister due to damage of underlying soft tissue from pressure and/or shear. Preineum area dark in color, n (Active)  05/31/22 2030  Location: Perineum  Location Orientation: Bilateral  Staging: Deep Tissue Pressure Injury - Purple or maroon localized area of discolored intact skin or blood-filled blister due to damage of underlying soft tissue from pressure and/or shear.  Wound Description (Comments): Preineum area dark in color, non-blancahable multiple small opan areas  Present on Admission:   Dressing Type Foam - Lift dressing to assess site every shift 06/07/22 2100     Patient was seen by physical therapy, who recommended Home health, which was arranged. On the day of the discharge the patient's vitals were stable, and no other acute medical condition were reported by patient. the patient was felt  safe to be discharge at Home with Home health and hospice care  Consultants: Cardiology, GI and palliative care Procedures: s/p EGD  Discharge Exam: General: Appear in no distress, no Rash; Oral Mucosa Clear, moist. Cardiovascular: S1 and S2 Present, no Murmur, Respiratory: normal respiratory effort, Bilateral Air entry present and mild b/l Crackles, no wheezes Abdomen: Bowel Sound present, Soft and no tenderness, no hernia, mild abdominal wall edema, significantly improved Extremities: no Pedal edema, no calf tenderness, chronic venous stasis pigmentation Neurology: alert and oriented to time, place, and person affect appropriate.  Filed Weights   06/07/22 1039 06/08/22 0757 06/11/22 0815  Weight: 92.6 kg 92.3 kg 82.1 kg   Vitals:   06/11/22 0345 06/11/22 0815  BP: (!) 100/51 (!) 117/59  Pulse: 69 68  Resp: 18 18  Temp: 98.1 F (36.7 C) 97.8 F (36.6 C)  SpO2: 95% 100%    DISCHARGE MEDICATION: Allergies as of 06/11/2022   No Known Allergies      Medication List     STOP taking these medications    apixaban 2.5 MG Tabs tablet Commonly known as: ELIQUIS   apixaban 5 MG Tabs tablet Commonly known as: ELIQUIS   atorvastatin 40 MG tablet Commonly known as: LIPITOR   colchicine 0.6 MG tablet   cyanocobalamin 1000 MCG tablet Commonly  known as: VITAMIN B12   diltiazem 240 MG 24 hr capsule Commonly known as: CARDIZEM CD   metFORMIN 500 MG 24 hr tablet Commonly known as: GLUCOPHAGE-XR   ramipril 10 MG capsule Commonly known as: ALTACE       TAKE these medications    amiodarone 200 MG tablet Commonly known as: PACERONE Take 200 mg by mouth daily.   carvedilol 3.125 MG tablet Commonly known as: COREG Take 1 tablet (3.125 mg total) by mouth 2 (two) times daily. What changed:  medication strength how much to take Another medication with the same name was removed. Continue taking this medication, and follow the directions you see here.    guaiFENesin-dextromethorphan 100-10 MG/5ML syrup Commonly known as: ROBITUSSIN DM Take 5 mLs by mouth every 4 (four) hours as needed for cough.   pantoprazole 40 MG tablet Commonly known as: PROTONIX Take 1 tablet (40 mg total) by mouth daily.   ProAir RespiClick 027 (90 Base) MCG/ACT Aepb Generic drug: Albuterol Sulfate Inhale into the lungs.   Semaglutide (1 MG/DOSE) 2 MG/1.5ML Sopn Inject 1 mg into the skin every Thursday.   torsemide 20 MG tablet Commonly known as: DEMADEX Take 1 tablet (20 mg total) by mouth 2 (two) times daily. What changed:  how much to take when to take this additional instructions               Discharge Care Instructions  (From admission, onward)           Start     Ordered   06/11/22 0000  Discharge wound care:       Comments: As above   06/11/22 1128           No Known Allergies Discharge Instructions     Diet - low sodium heart healthy   Complete by: As directed    Discharge instructions   Complete by: As directed    Follow-up with hospice care at home.  Eliquis has been stopped, some of the medication has been continued, hospice and patient can discuss to stop medication and gradually are altogether at home what ever patient and family decides.   Discharge wound care:   Complete by: As directed    As above   Increase activity slowly   Complete by: As directed        The results of significant diagnostics from this hospitalization (including imaging, microbiology, ancillary and laboratory) are listed below for reference.    Significant Diagnostic Studies: DG Chest Port 1 View  Result Date: 06/07/2022 CLINICAL DATA:  [id number] Pleural effusion on right [id number] Status post thoracentesis [id number] EXAM: PORTABLE CHEST 1 VIEW COMPARISON:  06/07/2022 FINDINGS: Postoperative changes in the mediastinum. Cardiac enlargement. Pulmonary vascular congestion with perihilar edema and bilateral pleural effusions. The right  pleural effusion demonstrates improvement since prior study. Basilar atelectasis. No pneumothorax. Calcification of the aorta. Degenerative changes in the shoulders. IMPRESSION: Cardiac enlargement with pulmonary vascular congestion, perihilar edema, and pleural effusions. Right pleural effusion is improved since prior study. No pneumothorax. Electronically Signed   By: Lucienne Capers M.D.   On: 06/07/2022 15:28   US THORACENTESIS ASP PLEURAL SPACE W/IMG GUIDE  Result Date: 06/07/2022 INDICATION: Patient with history of HFmrEF, AFib on Eliquis, CKD stage 3 complaining of SOB, BLE edema and abdominal swelling. Chest x-ray demonstrated bilateral pleural effusions. Request received for diagnostic and therapeutic thoracentesis. EXAM: ULTRASOUND GUIDED DIAGNOSTIC AND THERAPEUTIC RIGHT THORACENTESIS MEDICATIONS: 10 mL 1 % lidocaine COMPLICATIONS: None immediate. PROCEDURE: An  ultrasound guided thoracentesis was thoroughly discussed with the patient and questions answered. The benefits, risks, alternatives and complications were also discussed. The patient understands and wishes to proceed with the procedure. Written consent was obtained. Ultrasound was performed to localize and mark an adequate pocket of fluid in the right chest. The area was then prepped and draped in the normal sterile fashion. 1% Lidocaine was used for local anesthesia. Under ultrasound guidance a 6 Fr Safe-T-Centesis catheter was introduced. Thoracentesis was performed. The catheter was removed and a dressing applied. FINDINGS: A total of approximately 1 L of clear, yellow fluid was removed. Samples were sent to the laboratory as requested by the clinical team. IMPRESSION: Successful ultrasound guided right thoracentesis yielding 1 L of pleural fluid. Read by: Narda Rutherford, AGNP-BC Electronically Signed   By: Jerilynn Mages.  Shick M.D.   On: 06/07/2022 15:24   DG Chest 2 View  Result Date: 06/07/2022 CLINICAL DATA:  History of congestive heart failure  status post diuresis with persistent oxygen requirement EXAM: CHEST - 2 VIEW COMPARISON:  Chest radiograph dated 05/29/2022 FINDINGS: Low lung volumes. Bibasilar dense and patchy opacities. increased moderate right and small left pleural effusions. Enlarged cardiomediastinal silhouette. Median sternotomy wires are nondisplaced. IMPRESSION: 1. Increased moderate right and small left pleural effusions. 2. Bibasilar dense and patchy opacities, likely atelectasis. Electronically Signed   By: Darrin Nipper M.D.   On: 06/07/2022 09:30   ECHOCARDIOGRAM COMPLETE  Result Date: 05/30/2022    ECHOCARDIOGRAM REPORT   Patient Name:   MOO GRAVLEY Date of Exam: 05/30/2022 Medical Rec #:  [id number]     Height:       68.0 in Accession #:    [id number]    Weight:       231.1 lb Date of Birth:  03-Oct-1940      BSA:          2.173 m Patient Age:    82 years      BP:           114/55 mmHg Patient Gender: M             HR:           53 bpm. Exam Location:  [facility] Procedure: 2D Echo, Cardiac Doppler and Color Doppler Indications:    CHF- Acute Systolic V56.43  History:        Patient has prior history of Echocardiogram examinations, most                 recent 09/18/2021. CHF, CAD and Previous Myocardial Infarction,                 Prior CABG, Arrythmias:Atrial Fibrillation, Signs/Symptoms:Chest                 Pain; Risk Factors:Dyslipidemia, Hypertension, Sleep Apnea and                 Diabetes. CKD, stage III.  Sonographer:    Ronny Flurry Referring Phys: [id number] IULIA BASARABA IMPRESSIONS  1. Left ventricular ejection fraction, by estimation, is 30 to 35%. The left ventricle has moderately decreased function. Left ventricular endocardial border not optimally defined to evaluate regional wall motion. The left ventricular internal cavity size was mildly dilated. There is moderate left ventricular hypertrophy. Left ventricular diastolic parameters are consistent with Grade III diastolic dysfunction (restrictive).  2. Right ventricular  systolic function is severely reduced. The right ventricular size is mildly enlarged. There is moderately elevated pulmonary artery systolic pressure.  3.  Left atrial size was moderately dilated.  4. Right atrial size was moderately dilated.  5. The mitral valve is abnormal. Moderate mitral valve regurgitation. No evidence of mitral stenosis. Moderate mitral annular calcification.  6. Tricuspid valve regurgitation is moderate to severe.  7. The aortic valve is normal in structure. Aortic valve regurgitation is not visualized. Aortic valve sclerosis is present, with no evidence of aortic valve stenosis.  8. Moderately dilated pulmonary artery.  9. The inferior vena cava is dilated in size with <50% respiratory variability, suggesting right atrial pressure of 15 mmHg. FINDINGS  Left Ventricle: Left ventricular ejection fraction, by estimation, is 30 to 35%. The left ventricle has moderately decreased function. Left ventricular endocardial border not optimally defined to evaluate regional wall motion. The left ventricular internal cavity size was mildly dilated. There is moderate left ventricular hypertrophy. Abnormal (paradoxical) septal motion consistent with post-operative status. Left ventricular diastolic parameters are consistent with Grade III diastolic dysfunction  (restrictive). Right Ventricle: The right ventricular size is mildly enlarged. No increase in right ventricular wall thickness. Right ventricular systolic function is severely reduced. There is moderately elevated pulmonary artery systolic pressure. The tricuspid regurgitant velocity is 2.92 m/s, and with an assumed right atrial pressure of 15 mmHg, the estimated right ventricular systolic pressure is 49.1 mmHg. Left Atrium: Left atrial size was moderately dilated. Right Atrium: Right atrial size was moderately dilated. Pericardium: There is no evidence of pericardial effusion. Mitral Valve: The mitral valve is abnormal. There is mild thickening of  the mitral valve leaflet(s). Moderate mitral annular calcification. Moderate mitral valve regurgitation. No evidence of mitral valve stenosis. Tricuspid Valve: The tricuspid valve is normal in structure. Tricuspid valve regurgitation is moderate to severe. No evidence of tricuspid stenosis. Aortic Valve: The aortic valve is normal in structure. Aortic valve regurgitation is not visualized. Aortic valve sclerosis is present, with no evidence of aortic valve stenosis. Aortic valve mean gradient measures 3.5 mmHg. Aortic valve peak gradient measures 6.5 mmHg. Aortic valve area, by VTI measures 3.06 cm. Pulmonic Valve: The pulmonic valve was normal in structure. Pulmonic valve regurgitation is mild to moderate. No evidence of pulmonic stenosis. Aorta: The aortic root is normal in size and structure. Pulmonary Artery: The pulmonary artery is moderately dilated. Venous: The inferior vena cava is dilated in size with less than 50% respiratory variability, suggesting right atrial pressure of 15 mmHg. IAS/Shunts: No atrial level shunt detected by color flow Doppler.  LEFT VENTRICLE PLAX 2D LVIDd:         5.50 cm   Diastology LVIDs:         4.60 cm   LV e' medial:    5.43 cm/s LV PW:         1.40 cm   LV E/e' medial:  22.1 LV IVS:        1.40 cm   LV e' lateral:   6.97 cm/s LVOT diam:     2.20 cm   LV E/e' lateral: 17.2 LV SV:         86 LV SV Index:   40 LVOT Area:     3.80 cm  RIGHT VENTRICLE            IVC RV S prime:     3.88 cm/s  IVC diam: 2.50 cm TAPSE (M-mode): 1.0 cm LEFT ATRIUM           Index        RIGHT ATRIUM  Index LA diam:      4.80 cm 2.21 cm/m   RA Area:     27.70 cm LA Vol (A2C): 55.9 ml 25.72 ml/m  RA Volume:   99.70 ml  45.88 ml/m LA Vol (A4C): 71.9 ml 33.08 ml/m  AORTIC VALVE                    PULMONIC VALVE AV Area (Vmax):    3.04 cm     PR End Diast Vel: 10.24 msec AV Area (Vmean):   3.04 cm AV Area (VTI):     3.06 cm AV Vmax:           127.50 cm/s AV Vmean:          84.500 cm/s AV  VTI:            0.281 m AV Peak Grad:      6.5 mmHg AV Mean Grad:      3.5 mmHg LVOT Vmax:         102.00 cm/s LVOT Vmean:        67.600 cm/s LVOT VTI:          0.226 m LVOT/AV VTI ratio: 0.80  AORTA Ao Root diam: 3.30 cm Ao Asc diam:  3.50 cm MITRAL VALVE                TRICUSPID VALVE MV Area (PHT): 4.06 cm     TR Peak grad:   34.1 mmHg MV Decel Time: 187 msec     TR Vmax:        292.00 cm/s MR Peak grad: 66.3 mmHg MR Vmax:      407.00 cm/s   SHUNTS MV E velocity: 120.00 cm/s  Systemic VTI:  0.23 m MV A velocity: 47.40 cm/s   Systemic Diam: 2.20 cm MV E/A ratio:  2.53 Lorine Bears MD Electronically signed by Lorine Bears MD Signature Date/Time: 05/30/2022/4:25:38 PM    Final    US ARTERIAL ABI (SCREENING LOWER EXTREMITY)  Result Date: 05/30/2022 CLINICAL DATA:  82 year old male with a history of bilateral lower extremity edema EXAM: NONINVASIVE PHYSIOLOGIC VASCULAR STUDY OF BILATERAL LOWER EXTREMITIES TECHNIQUE: Evaluation of both lower extremities was performed at rest, including calculation of ankle-brachial indices, multiple segmental pressure evaluation, segmental Doppler and segmental pulse volume recording. COMPARISON:  None Available. FINDINGS: Right ABI:  1.47 Left ABI:  1.55 Right Lower Extremity: Segmental Doppler at the right ankle demonstrates monophasic waveforms Left Lower Extremity: Segmental Doppler at the left ankle demonstrates triphasic posterior tibial and dorsalis pedis waveform. IMPRESSION: Right: Resting ABI is likely falsely elevated secondary to noncompressible vessels, given the segmental Doppler. Segmental Doppler demonstrates monophasic waveforms at the right ankle, indicating more proximal occlusive disease. Left: Resting ABI likely falsely elevated secondary to noncompressible vessels. Segmental Doppler at the left ankle demonstrates waveforms relatively maintained. Signed, Yvone Neu. Miachel Roux, RPVI [facility]  Electronically Signed   By: Gilmer Mor D.O.   On: 05/30/2022 10:56   DG Chest 2 View  Result Date: 05/29/2022 CLINICAL DATA:  Shortness of breath and ascites.  History of CHF. EXAM: CHEST - 2 VIEW COMPARISON:  09/16/2021 FINDINGS: Cardiac enlargement. Aortic atherosclerosis. Previous median sternotomy and CABG procedure. Bilateral pleural effusions, right greater than left. Mild interstitial edema. Atelectasis noted in both lung bases. No airspace opacities. IMPRESSION: 1. Mild congestive heart failure. 2. Bibasilar atelectasis. Electronically Signed   By: Signa Kell M.D.   On: 05/29/2022  10:27    Microbiology: Recent Results (from the past 240 hour(s))  Body fluid culture w Gram Stain     Status: None (Preliminary result)   Collection Time: 06/07/22  3:01 PM   Specimen: PATH Cytology Pleural fluid  Result Value Ref Range Status   Specimen Description   Final    PLEURAL Performed at [facility], [address] [place], [place] [address]    Special Requests   Final    CYTO PLEU Performed at [facility], [address] [place], [place] [address]    Gram Stain   Final    WBC PRESENT, PREDOMINANTLY MONONUCLEAR NO ORGANISMS SEEN CYTOSPIN SMEAR    Culture   Final    NO GROWTH 3 DAYS Performed at [facility], [address], [place], [place] [address]    Report Status PENDING  Incomplete     Labs: CBC: Recent Labs  Lab 06/07/22 0601 06/08/22 0357 06/09/22 0618 06/10/22 0459 06/11/22 0505  WBC 3.2* 2.8* 2.5* 4.4 3.8*  HGB 8.4* 7.6* 7.6* 8.8* 8.4*  HCT 28.4* 25.4* 25.4* 28.9* 27.4*  MCV 90.2 90.7 91.0 88.9 90.4  PLT 92* 90* 93* 103* 102*   Basic Metabolic Panel: Recent Labs  Lab 06/06/22 0428 06/07/22 0601 06/08/22 0357 06/09/22 0618 06/10/22 0459 06/11/22 0505  NA 135 136 138 139 137 138  K 4.2 4.2 4.2 4.1 4.0 3.9  CL 94* 93* 97* 97* 95* 95*  CO2 34* 34* 34* 34* 34* 37*  GLUCOSE 88 85 92 92 95 101*  BUN 53* 57* 60* 61* 56* 61*   CREATININE 2.02* 1.85* 1.82* 1.75* 1.63* 1.90*  CALCIUM 7.5* 7.7* 7.6* 7.9* 8.0* 8.0*  MG 2.2 2.2 2.1 2.1  --   --   PHOS  --  3.3 2.5 2.0*  --   --    Liver Function Tests: Recent Labs  Lab 06/08/22 1436  AST 46*  ALT 31  ALKPHOS 90  BILITOT 0.9  PROT 6.1*  ALBUMIN 2.6*   No results for input(s): "LIPASE", "AMYLASE" in the last 168 hours. No results for input(s): "AMMONIA" in the last 168 hours. Cardiac Enzymes: No results for input(s): "CKTOTAL", "CKMB", "CKMBINDEX", "TROPONINI" in the last 168 hours. BNP (last 3 results) Recent Labs    09/18/21 1244 05/29/22 0949 06/07/22 0601  BNP 1,083.4* 1,349.1* 1,027.2*   CBG: Recent Labs  Lab 06/10/22 0823 06/10/22 1225 06/10/22 1628 06/10/22 2101 06/11/22 0817  GLUCAP 95 87 112* 129* 79    Time spent: 35 minutes  Signed:  Gillis Santa  [facility] 06/11/2022 11:28 AM

## 2022-06-11 NOTE — Progress Notes (Signed)
[facility])  Hospital Liaison RN note  Received request from [facility] for hospice services at home after discharge. Chart and patient information under review by Hospice physician.    Spoke with patient at bedside and son Eddie Dibbles to initiate education related to hospice philosophy, services, and team approach to care. Patient/family verbalized understanding of information given. Per discussion, the plan is for discharge home by EMS today.   DME needs discussed. Patient has the following equipment in the home. Gilford Rile, [facility] Patient/family requests the following equipment for deliver: Oxygen.  Address has been verified and is correct in the chart.   Please send signed and completed DNR with patient/family. Please provide symptoms at discharge as needed for ongoing symptom management.    Thank you for the opportunity to participate in this patient's care.  Jhonnie Garner, Therapist, sports, BSN [employer] [phone]

## 2022-06-11 NOTE — Progress Notes (Signed)
[facility] Cardiology  SUBJECTIVE: Patient laying in bed, reports feeling better, with less shortness of breath   Vitals:   06/10/22 2119 06/11/22 0024 06/11/22 0345 06/11/22 0815  BP: 98/71 (!) 94/53 (!) 100/51 (!) 117/59  Pulse: 77 76 69 68  Resp: 18 18 18 18   Temp: 98 F (36.7 C) 98 F (36.7 C) 98.1 F (36.7 C) 97.8 F (36.6 C)  TempSrc:   Oral Oral  SpO2: 100% 100% 95% 100%  Weight:    82.1 kg  Height:         Intake/Output Summary (Last 24 hours) at 06/11/2022 0900 Last data filed at 06/10/2022 2300 Gross per 24 hour  Intake 240 ml  Output 1500 ml  Net -1260 ml      PHYSICAL EXAM  General: Well developed, well nourished, in no acute distress HEENT:  Normocephalic and atramatic Neck:  No JVD.  Lungs: Clear bilaterally to auscultation and percussion. Heart: HRRR . Normal S1 and S2 without gallops or murmurs.  Abdomen: Bowel sounds are positive, abdomen soft and non-tender  Msk:  Back normal, normal gait. Normal strength and tone for age. Extremities: No clubbing, cyanosis or edema.   Neuro: Alert and oriented X 3. Psych:  Good affect, responds appropriately   LABS: Basic Metabolic Panel: Recent Labs    06/09/22 0618 06/10/22 0459 06/11/22 0505  NA 139 137 138  K 4.1 4.0 3.9  CL 97* 95* 95*  CO2 34* 34* 37*  GLUCOSE 92 95 101*  BUN 61* 56* 61*  CREATININE 1.75* 1.63* 1.90*  CALCIUM 7.9* 8.0* 8.0*  MG 2.1  --   --   PHOS 2.0*  --   --    Liver Function Tests: Recent Labs    06/08/22 1436  AST 46*  ALT 31  ALKPHOS 90  BILITOT 0.9  PROT 6.1*  ALBUMIN 2.6*   No results for input(s): "LIPASE", "AMYLASE" in the last 72 hours. CBC: Recent Labs    06/10/22 0459 06/11/22 0505  WBC 4.4 3.8*  HGB 8.8* 8.4*  HCT 28.9* 27.4*  MCV 88.9 90.4  PLT 103* 102*   Cardiac Enzymes: No results for input(s): "CKTOTAL", "CKMB", "CKMBINDEX", "TROPONINI" in the last 72 hours. BNP: Invalid input(s): "POCBNP" D-Dimer: No results for input(s): "DDIMER" in the  last 72 hours. Hemoglobin A1C: No results for input(s): "HGBA1C" in the last 72 hours. Fasting Lipid Panel: No results for input(s): "CHOL", "HDL", "LDLCALC", "TRIG", "CHOLHDL", "LDLDIRECT" in the last 72 hours. Thyroid Function Tests: No results for input(s): "TSH", "T4TOTAL", "T3FREE", "THYROIDAB" in the last 72 hours.  Invalid input(s): "FREET3" Anemia Panel: No results for input(s): "VITAMINB12", "FOLATE", "FERRITIN", "TIBC", "IRON", "RETICCTPCT" in the last 72 hours.  No results found.   Echo EF 30-35%, moderate mitral regurgitation, moderate-severe tricuspid regurgitation 05/30/2022  TELEMETRY: Sinus rhythm 74 bpm:  ASSESSMENT AND PLAN:  Principal Problem:   Acute on chronic heart failure [facility]) Active Problems:   Atrial fibrillation status post cardioversion 10/22/19 [facility])   Essential hypertension   DM (diabetes mellitus), type 2 (HCC)   Stage 3b chronic kidney disease (CKD) ([place])   Venous stasis dermatitis of both lower extremities    1.  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  Coronary artery disease, status post CABG, currently without chest pain 3.  Paroxysmal atrial fibrillation, on low-dose Eliquis for stroke  prevention, and amiodarone for rate and rhythm control 4.  Anemia / GI bleed, diffuse x 2, unremarkable EGD, with recommendation to repeat transfusion when hemoglobin less than 8 5.  Chronic kidney disease stage IIIb  Recommendations  1.  Agree with current therapy 2.  Continue torsemide 20 mg twice daily 3.  Carefully monitor renal status 4.  Continue low-dose Eliquis 5.  Carefully monitor hemoglobin 6.  Continue carvedilol, spironolactone, isosorbide mononitrate 7.  Follow-up with Dr. Clayborn Bigness following discharge   Isaias Cowman, MD, PhD,  [facility] 06/11/2022 9:00 AM

## 2022-06-11 NOTE — TOC Transition Note (Signed)
Transition of Care [facility]) - CM/SW Discharge Note   Patient Details  Name: Randall Vincent MRN: [id number] Date of Birth: 1940-07-01  [facility]) CM/SW Contact:  Valente David, RN Phone Number: 06/11/2022, 3:46 PM   Clinical Narrative:     Patient has discharge orders to home, will go home with hospice.  EMS paperwork completed and printed.  Home address confirmed by son Eddie Dibbles, aware of discharge.  EMS called, patient is 3rd on list for pickup, bedside nurse made aware.    Final next level of care: Home w Hospice Care Barriers to Discharge: Barriers Resolved   Patient Goals and CMS Choice   Choice offered to / list presented to : Patient  Discharge Placement                  Patient to be transferred to facility by: [facility] no emergent Name of family member notified: Reino Lybbert Patient and family notified of of transfer: 06/11/22  Discharge Plan and Services Additional resources added to the After Visit Summary for       Post Acute Care Choice:  (Outpatient PT)                               Social Determinants of Health (SDOH) Interventions [place]: No Food Insecurity (05/30/2022)  Housing: Low Risk  (05/30/2022)  Transportation Needs: No Transportation Needs (05/30/2022)  Utilities: Not At Risk (05/30/2022)  Tobacco Use: Medium Risk (06/10/2022)     Readmission Risk Interventions    06/01/2022    2:17 PM  Readmission Risk Prevention Plan  Transportation Screening Complete  PCP or Specialist Appt within 3-5 Days Complete  Social Work Consult for [place] Planning/Counseling Complete  Palliative Care Screening Not Applicable  Medication Review [employer]) Complete

## 2022-06-11 NOTE — Plan of Care (Signed)

## 2022-06-12 NOTE — Progress Notes (Deleted)
Patient ID: Randall Vincent, male    DOB: [birth year], 82 y.o.   MRN: [id number]  HPI  Mr Colston is a 82 y/o male with a history of DM, hyperlipidemia, HTN, atrial fibrillation, CAD, previous tobacco use and chronic heart failure.   Echo 05/30/22 showed an EF of 30-35% along with moderate LVH, moderately elevated PA pressure, moderate MR and moderate/ severe TR. Echo report from 01/11/20 reviewed and showed an EF of 50-55% along with mild LAE.  Admitted 05/29/22 due to SOB/ edema due to a/c heart failure. Diuresed.   He presents today for a follow-up visit although hasn't been seen since 03/2020. He presents with a chief complaint of    Past Medical History:  Diagnosis Date   A-fib [facility])    a.) CHA2DS2-VASc = 6 (age x 2, CHF, HTN, previous MI, T2DM). b.) Rate/rhythm maintained on amiodarone + diltiazem + carvedilol; chronically anticoagulated using dabigatran. c.) DCCV 07/10/2015, 08/08/2019, 10/22/2019.   Arthritis    Chest pain 06/26/2019   CHF (congestive heart failure) (HCC)    CKD (chronic kidney disease), stage III ([place])    Coronary artery disease 1991   a.) 2v CABG in 1991. b.) PCI 06/07/2006 --> Vision stents to native LCx and RCA   Hyperlipidemia    Hypertension    Long term current use of anticoagulant    a.) dabigatran   Myocardial infarction ([place]) 1991   a.) LHC --> 50% and 90% LAD lesions; referred to CVTS. 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   a.) LIMA-LAD, SVG-RCA   T2DM (type 2 diabetes mellitus) ([place])    Past Surgical History:  Procedure Laterality Date   BACK SURGERY  1970   L5-L6 ruptured disk   BLADDER SURGERY  2000   CARDIOVERSION N/A 07/10/2015   CARDIOVERSION N/A 08/08/2019   CARDIOVERSION N/A 10/22/2019   COLON SURGERY     COLONOSCOPY N/A 01/15/2020   Procedure: COLONOSCOPY;  Surgeon: Virgel Manifold, MD;  Location: [facility] ENDOSCOPY;  Service: Endoscopy;  Laterality: N/A;   COLONOSCOPY N/A  01/16/2020   Procedure: COLONOSCOPY;  Surgeon: Virgel Manifold, MD;  Location: [facility] ENDOSCOPY;  Service: Endoscopy;  Laterality: N/A;   CORONARY ARTERY BYPASS GRAFT N/A 01/02/1990   Procedure: 2v CORONARY ARTERY BYPASS GRAFT (LIMA-LAD, SVG-RCA)   ESOPHAGOGASTRODUODENOSCOPY N/A 01/13/2020   Procedure: ESOPHAGOGASTRODUODENOSCOPY (EGD);  Surgeon: Virgel Manifold, MD;  Location: [facility] ENDOSCOPY;  Service: Endoscopy;  Laterality: N/A;   REPLACEMENT TOTAL KNEE BILATERAL Bilateral 2008   STENT PLACEMENT VASCULAR ([place] HX)  2008   TOTAL KNEE REVISION Left 01/16/2020   Procedure: TOTAL KNEE REVISION;  Surgeon: Lovell Sheehan, MD;  Location: [facility] ORS;  Service: Orthopedics;  Laterality: Left;   TOTAL KNEE REVISION Left 05/31/2021   Procedure: TOTAL KNEE REVISION;  Surgeon: Lovell Sheehan, MD;  Location: [facility] ORS;  Service: Orthopedics;  Laterality: Left;   No family history on file. Social History   Tobacco Use   Smoking status: Former    Types: Cigarettes    Quit date: 1991    Years since quitting: 33.0   Smokeless tobacco: Never  Substance Use Topics   Alcohol use: Not Currently    Alcohol/week: 1.0 standard drink of alcohol    Types: 1 Cans of beer per week    Comment: very rarely   No Known Allergies  Prior to Admission medications   Medication Sig Start Date End Date Taking?  Authorizing Provider  amiodarone (PACERONE) 200 MG tablet Take 200 mg by mouth daily. 10/29/19  Yes [provider]  atorvastatin (LIPITOR) 40 MG tablet Take 40 mg by mouth every evening.  06/18/19  Yes [provider]  carvedilol (COREG) 3.125 MG tablet Take 1 tablet (3.125 mg total) by mouth 2 (two) times daily with a meal. 02/09/20  Yes Wouk, Ailene Rud, MD  dabigatran (PRADAXA) 75 MG CAPS capsule Take 75 mg by mouth 2 (two) times daily.   Yes [provider]  diltiazem (CARDIZEM CD) 120 MG 24 hr capsule Take 1 capsule (120 mg total) by mouth daily. 02/10/20  Yes Wouk, Ailene Rud, MD  ferrous sulfate 325 (65 FE) MG tablet Take 325 mg by mouth 2 (two) times daily with a meal.    Yes [provider]  metFORMIN (GLUCOPHAGE) 1000 MG tablet Take 1,000 mg by mouth 2 (two) times daily with a meal.   Yes [provider]  mupirocin ointment (BACTROBAN) 2 % Apply 1 application topically daily as needed (leg sores).  11/16/19  Yes [provider]  ramipril (ALTACE) 10 MG capsule Take 1 capsule (10 mg total) by mouth daily. 01/31/20  Yes Fritzi Mandes, MD  Semaglutide, 1 MG/DOSE, 2 MG/1.5ML SOPN Inject 1 mg into the skin every Thursday.   Yes [provider]  spironolactone (ALDACTONE) 25 MG tablet Take 25 mg by mouth daily. 06/18/19  Yes [provider]  torsemide (DEMADEX) 20 MG tablet Take 1 tablet (20 mg total) by mouth 2 (two) times daily. Patient taking differently: Take 10 mg by mouth daily.  02/09/20  Yes Wouk, Ailene Rud, MD  traMADol (ULTRAM) 50 MG tablet Take 1 tablet (50 mg total) by mouth every 8 (eight) hours as needed for moderate pain or severe pain. 01/31/20  Yes Fritzi Mandes, MD  vitamin B-12 (CYANOCOBALAMIN) 1000 MCG tablet Take 1,000 mcg by mouth daily.   Yes [provider]    Review of Systems  Constitutional:  Positive for fatigue. Negative for appetite change.  HENT:  Negative for congestion, nosebleeds, postnasal drip and sore throat.   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  Respiratory:  Negative for cough, shortness of breath and wheezing.   Cardiovascular:  Positive for leg swelling. Negative for chest pain and palpitations.  Gastrointestinal:  Negative for abdominal distention and abdominal pain.  Endocrine: Negative.   Genitourinary: Negative.   Musculoskeletal:  Positive for arthralgias (left leg upon movement). Negative for back pain.  Skin: Negative.   Allergic/Immunologic: Negative.   Neurological:  Negative for dizziness and light-headedness.  Hematological:   Negative for adenopathy. Bruises/bleeds easily.  Psychiatric/Behavioral:  Negative for dysphoric mood and sleep disturbance (sleeping in recliner ). The patient is not nervous/anxious.    There were no vitals filed for this visit.  Wt Readings from Last 3 Encounters:  06/11/22 181 lb 1.6 oz (82.1 kg)  05/05/22 210 lb (95.3 kg)  02/01/22 200 lb (90.7 kg)   Lab Results  Component Value Date   CREATININE 1.90 (H) 06/11/2022   CREATININE 1.63 (H) 06/10/2022   CREATININE 1.75 (H) 06/09/2022    Physical Exam Vitals and nursing note reviewed.  Constitutional:      Appearance: He is well-developed.  HENT:     Head: Normocephalic and atraumatic.  Cardiovascular:     Rate and Rhythm: Normal rate and regular rhythm.  Pulmonary:     Effort: Pulmonary effort is normal. No respiratory distress.  Breath sounds: No wheezing or rales.  Abdominal:     Palpations: Abdomen is soft.     Tenderness: There is no abdominal tenderness.  Musculoskeletal:     Cervical back: Normal range of motion and neck supple.     Right lower leg: No tenderness. Edema (1+ pitting) present.     Left lower leg: Edema (1+ pitting) present.  Skin:    General: Skin is warm and dry.     Findings: Ecchymosis (bilateral arms) present.  Neurological:     General: No focal deficit present.     Mental Status: He is alert.  Psychiatric:        Mood and Affect: Mood normal.        Behavior: Behavior normal.    Assessment & Plan:  1: Chronic heart failure with reduced ejection fraction- - NYHA class II - euvolemic today - weighing at home and says that his weight ranges from 230-234 pounds - weight today in clinic was 241 pounds - saw cardiology ([place]) 03/16/20 & returns December  - participating in outpatient PT - BNP 02/02/20 was 1387.0 - reports receiving both covid vaccines and booster vaccine - received his flu vaccine for this season  2: HTN- - BP mildly elevated today but patient walked to our office  from the [place] entrance - saw PCP Enid Derry) 04/05/20 - BMP 04/05/20 reviewed and showed sodium 139, potassium 5.4, creatinine 2.28 and GFR 26 - patient says labs are getting repeated next week  3: DM- - A1c 04/05/20 was 6.1% - glucose in clinic today was 118  4: Atrial fibrillation- - saw cardiology ([place]) 02/12/20    Patient did not bring his medications nor a list. Each medication was verbally reviewed with the patient and he was encouraged to bring the bottles to every visit to confirm accuracy of list.  Due to HF stability, will not make a return appointment for patient at this time. Advised patient that he could call back at anytime to schedule another appointment and he was comfortable with this plan.

## 2022-06-12 NOTE — Anesthesia Postprocedure Evaluation (Signed)
Anesthesia Post Note  Patient: Randall Vincent  Procedure(s) Performed: ESOPHAGOGASTRODUODENOSCOPY (EGD) WITH PROPOFOL  Patient location during evaluation: Endoscopy Anesthesia Type: General Level of consciousness: awake and alert Pain management: pain level controlled Vital Signs Assessment: post-procedure vital signs reviewed and stable Respiratory status: spontaneous breathing, nonlabored ventilation, respiratory function stable and patient connected to nasal cannula oxygen Cardiovascular status: blood pressure returned to baseline and stable Postop Assessment: no apparent nausea or vomiting Anesthetic complications: no   No notable events documented.   Last Vitals:  Vitals:   06/11/22 1212 06/11/22 1935  BP: (!) 100/51 100/72  Pulse: 67 69  Resp: 20 18  Temp: 36.6 C   SpO2: 100% 97%    Last Pain:  Vitals:   06/11/22 1212  TempSrc: Oral  PainSc:                  [name]

## 2022-06-13 ENCOUNTER — Encounter: Payer: Self-pay | Admitting: Gastroenterology

## 2022-06-13 ENCOUNTER — Encounter: Payer: Medicare Other | Admitting: Family

## 2022-06-29 DIAGNOSIS — I890 Lymphedema, not elsewhere classified: Secondary | ICD-10-CM | POA: Insufficient documentation

## 2022-06-29 NOTE — Progress Notes (Deleted)
MRN : [id number]  Randall Vincent is a 82 y.o. (10/04/1940) male who presents with chief complaint of legs swell.  History of Present Illness:   The patient returns to the office for followup evaluation regarding leg swelling.  The swelling has persisted and the pain associated with swelling continues. There have not been any interval development of a ulcerations or wounds.  Since the previous visit the patient has been wearing graduated compression stockings and has noted little if any improvement in the lymphedema. The patient has been using compression routinely morning until night.  The patient also states elevation during the day and exercise is being done too.  No outpatient medications have been marked as taking for the 07/04/22 encounter (Appointment) with Delana Meyer, Dolores Lory, MD.    Past Medical History:  Diagnosis Date   A-fib [facility])    a.) CHA2DS2-VASc = 6 (age x 2, CHF, HTN, previous MI, T2DM). b.) Rate/rhythm maintained on amiodarone + diltiazem + carvedilol; chronically anticoagulated using dabigatran. c.) DCCV 07/10/2015, 08/08/2019, 10/22/2019.   Arthritis    Chest pain 06/26/2019   CHF (congestive heart failure) (HCC)    CKD (chronic kidney disease), stage III ([place])    Coronary artery disease 1991   a.) 2v CABG in 1991. b.) PCI 06/07/2006 --> Vision stents to native LCx and RCA   Hyperlipidemia    Hypertension    Long term current use of anticoagulant    a.) dabigatran   Myocardial infarction ([place]) 1991   a.) LHC --> 50% and 90% LAD lesions; referred to CVTS. 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   a.) LIMA-LAD, SVG-RCA   T2DM (type 2 diabetes mellitus) ([place])     Past Surgical History:  Procedure Laterality Date   BACK SURGERY  1970   L5-L6 ruptured disk   BLADDER SURGERY  2000   CARDIOVERSION N/A 07/10/2015   CARDIOVERSION N/A 08/08/2019   CARDIOVERSION N/A 10/22/2019   COLON SURGERY      COLONOSCOPY N/A 01/15/2020   Procedure: COLONOSCOPY;  Surgeon: Virgel Manifold, MD;  Location: [facility] ENDOSCOPY;  Service: Endoscopy;  Laterality: N/A;   COLONOSCOPY N/A 01/16/2020   Procedure: COLONOSCOPY;  Surgeon: Virgel Manifold, MD;  Location: [facility] ENDOSCOPY;  Service: Endoscopy;  Laterality: N/A;   CORONARY ARTERY BYPASS GRAFT N/A 01/02/1990   Procedure: 2v CORONARY ARTERY BYPASS GRAFT (LIMA-LAD, SVG-RCA)   ESOPHAGOGASTRODUODENOSCOPY N/A 01/13/2020   Procedure: ESOPHAGOGASTRODUODENOSCOPY (EGD);  Surgeon: Virgel Manifold, MD;  Location[facility] ENDOSCOPY;  Service: Endoscopy;  Laterality: N/A;   ESOPHAGOGASTRODUODENOSCOPY (EGD) WITH PROPOFOL N/A 06/10/2022   Procedure: ESOPHAGOGASTRODUODENOSCOPY (EGD) WITH PROPOFOL;  Surgeon: Lesly Rubenstein, MD;  Location: [facility] ENDOSCOPY;  Service: Endoscopy;  Laterality: N/A;   REPLACEMENT TOTAL KNEE BILATERAL Bilateral 2008   STENT PLACEMENT VASCULAR ([place] HX)  2008   TOTAL KNEE REVISION Left 01/16/2020   Procedure: TOTAL KNEE REVISION;  Surgeon: Lovell Sheehan, MD;  Location: [facility] ORS;  Service: Orthopedics;  Laterality: Left;   TOTAL KNEE REVISION Left 05/31/2021   Procedure: TOTAL KNEE REVISION;  Surgeon: Lovell Sheehan, MD;  Location: [facility] ORS;  Service: Orthopedics;  Laterality: Left;    Social History Social History   Tobacco Use   Smoking status: Former    Types: Cigarettes    Quit date: 1991    Years since quitting: 33.1   Smokeless tobacco: Never  Vaping Use   Vaping Use:  Never used  Substance Use Topics   Alcohol use: Not Currently    Alcohol/week: 1.0 standard drink of alcohol    Types: 1 Cans of beer per week    Comment: very rarely   Drug use: Never    Family History No family history on file.  No Known Allergies   REVIEW OF SYSTEMS (Negative unless checked)  Constitutional: [] Weight loss  [] Fever  [] Chills Cardiac: [] Chest pain   [] Chest pressure   [] Palpitations   [] Shortness of breath when laying flat    [] Shortness of breath with exertion. Vascular:  [] Pain in legs with walking   [x] Pain in legs with standing  [] History of DVT   [] Phlebitis   [x] Swelling in legs   [] Varicose veins   [] Non-healing ulcers Pulmonary:   [] Uses home oxygen   [] Productive cough   [] Hemoptysis   [] Wheeze  [] COPD   [] Asthma Neurologic:  [] Dizziness   [] Seizures   [] History of stroke   [] History of TIA  [] Aphasia   [] Vissual changes   [] Weakness or numbness in arm   [] Weakness or numbness in leg Musculoskeletal:   [] Joint swelling   [] Joint pain   [] Low back pain Hematologic:  [] Easy bruising  [] Easy bleeding   [] Hypercoagulable state   [] Anemic Gastrointestinal:  [] Diarrhea   [] Vomiting  [] Gastroesophageal reflux/heartburn   [] Difficulty swallowing. Genitourinary:  [] Chronic kidney disease   [] Difficult urination  [] Frequent urination   [] Blood in urine Skin:  [] Rashes   [] Ulcers  Psychological:  [] History of anxiety   []  History of major depression.  Physical Examination  There were no vitals filed for this visit. There is no height or weight on file to calculate BMI. Gen: WD/WN, NAD Head: [place]/AT, No temporalis wasting.  Ear/Nose/Throat: Hearing grossly intact, nares w/o erythema or drainage, pinna without lesions Eyes: PER, EOMI, sclera nonicteric.  Neck: Supple, no gross masses.  No JVD.  Pulmonary:  Good air movement, no audible wheezing, no use of accessory muscles.  Cardiac: RRR, precordium not hyperdynamic. Vascular:  scattered varicosities present bilaterally.  Mild venous stasis changes to the legs bilaterally.  3-4+ soft pitting edema, CEAP [id number]  Vessel Right Left  Radial Palpable Palpable  Gastrointestinal: soft, non-distended. No guarding/no peritoneal signs.  Musculoskeletal: M/S 5/5 throughout.  No deformity.  Neurologic: CN 2-12 intact. Pain and light touch intact in extremities.  Symmetrical.  Speech is fluent. Motor exam as listed above. Psychiatric: Judgment intact, Mood & affect  appropriate for pt's clinical situation. Dermatologic: Venous rashes no ulcers noted.  No changes consistent with cellulitis. Lymph : No lichenification or skin changes of chronic lymphedema.  CBC Lab Results  Component Value Date   WBC 3.8 (L) 06/11/2022   HGB 8.4 (L) 06/11/2022   HCT 27.4 (L) 06/11/2022   MCV 90.4 06/11/2022   PLT 102 (L) 06/11/2022    BMET    Component Value Date/Time   NA 138 06/11/2022 0505   K 3.9 06/11/2022 0505   CL 95 (L) 06/11/2022 0505   CO2 37 (H) 06/11/2022 0505   GLUCOSE 101 (H) 06/11/2022 0505   BUN 61 (H) 06/11/2022 0505   CREATININE 1.90 (H) 06/11/2022 0505   CALCIUM 8.0 (L) 06/11/2022 0505   GFRNONAA 35 (L) 06/11/2022 0505   GFRAA 44 (L) 02/09/2020 0500   CrCl cannot be calculated (Unknown ideal weight.).  COAG Lab Results  Component Value Date   INR 1.5 (H) 05/20/2021   INR 1.5 (H) 01/27/2020   INR 1.7 (H) 01/11/2020  Radiology DG Chest Port 1 View  Result Date: 06/07/2022 CLINICAL DATA:  [id number] Pleural effusion on right [id number] Status post thoracentesis [id number] EXAM: PORTABLE CHEST 1 VIEW COMPARISON:  06/07/2022 FINDINGS: Postoperative changes in the mediastinum. Cardiac enlargement. Pulmonary vascular congestion with perihilar edema and bilateral pleural effusions. The right pleural effusion demonstrates improvement since prior study. Basilar atelectasis. No pneumothorax. Calcification of the aorta. Degenerative changes in the shoulders. IMPRESSION: Cardiac enlargement with pulmonary vascular congestion, perihilar edema, and pleural effusions. Right pleural effusion is improved since prior study. No pneumothorax. Electronically Signed   By: Lucienne Capers M.D.   On: 06/07/2022 15:28   US THORACENTESIS ASP PLEURAL SPACE W/IMG GUIDE  Result Date: 06/07/2022 INDICATION: Patient with history of HFmrEF, AFib on Eliquis, CKD stage 3 complaining of SOB, BLE edema and abdominal swelling. Chest x-ray demonstrated bilateral pleural  effusions. Request received for diagnostic and therapeutic thoracentesis. EXAM: ULTRASOUND GUIDED DIAGNOSTIC AND THERAPEUTIC RIGHT THORACENTESIS MEDICATIONS: 10 mL 1 % lidocaine COMPLICATIONS: None immediate. PROCEDURE: An ultrasound guided thoracentesis was thoroughly discussed with the patient and questions answered. The benefits, risks, alternatives and complications were also discussed. The patient understands and wishes to proceed with the procedure. Written consent was obtained. Ultrasound was performed to localize and mark an adequate pocket of fluid in the right chest. The area was then prepped and draped in the normal sterile fashion. 1% Lidocaine was used for local anesthesia. Under ultrasound guidance a 6 Fr Safe-T-Centesis catheter was introduced. Thoracentesis was performed. The catheter was removed and a dressing applied. FINDINGS: A total of approximately 1 L of clear, yellow fluid was removed. Samples were sent to the laboratory as requested by the clinical team. IMPRESSION: Successful ultrasound guided right thoracentesis yielding 1 L of pleural fluid. Read by: Narda Rutherford, AGNP-BC Electronically Signed   By: Jerilynn Mages.  Shick M.D.   On: 06/07/2022 15:24   DG Chest 2 View  Result Date: 06/07/2022 CLINICAL DATA:  History of congestive heart failure status post diuresis with persistent oxygen requirement EXAM: CHEST - 2 VIEW COMPARISON:  Chest radiograph dated 05/29/2022 FINDINGS: Low lung volumes. Bibasilar dense and patchy opacities. increased moderate right and small left pleural effusions. Enlarged cardiomediastinal silhouette. Median sternotomy wires are nondisplaced. IMPRESSION: 1. Increased moderate right and small left pleural effusions. 2. Bibasilar dense and patchy opacities, likely atelectasis. Electronically Signed   By: Darrin Nipper M.D.   On: 06/07/2022 09:30     Assessment/Plan There are no diagnoses linked to this encounter.   Hortencia Pilar, MD  06/29/2022 6:41 PM

## 2022-07-04 ENCOUNTER — Ambulatory Visit (INDEPENDENT_AMBULATORY_CARE_PROVIDER_SITE_OTHER): Payer: Medicare Other | Admitting: Vascular Surgery

## 2022-07-04 ENCOUNTER — Encounter (INDEPENDENT_AMBULATORY_CARE_PROVIDER_SITE_OTHER): Payer: Medicare Other

## 2022-07-04 DIAGNOSIS — I10 Essential (primary) hypertension: Secondary | ICD-10-CM

## 2022-07-04 DIAGNOSIS — E119 Type 2 diabetes mellitus without complications: Secondary | ICD-10-CM

## 2022-07-04 DIAGNOSIS — I890 Lymphedema, not elsewhere classified: Secondary | ICD-10-CM

## 2022-07-04 DIAGNOSIS — I480 Paroxysmal atrial fibrillation: Secondary | ICD-10-CM

## 2022-07-04 DIAGNOSIS — I2510 Atherosclerotic heart disease of native coronary artery without angina pectoris: Secondary | ICD-10-CM

## 2022-08-25 ENCOUNTER — Other Ambulatory Visit
Admission: RE | Admit: 2022-08-25 | Discharge: 2022-08-25 | Disposition: A | Discharge status: Home / Self Care | Source: Ambulatory Visit | Attending: Internal Medicine | Admitting: Internal Medicine

## 2022-08-25 DIAGNOSIS — R0602 Shortness of breath: Secondary | ICD-10-CM | POA: Insufficient documentation

## 2022-08-25 DIAGNOSIS — I4891 Unspecified atrial fibrillation: Secondary | ICD-10-CM | POA: Insufficient documentation

## 2022-08-25 LAB — BRAIN NATRIURETIC PEPTIDE: B Natriuretic Peptide: 278.8 pg/mL — ABNORMAL HIGH (ref 0.0–100.0)

## 2022-08-29 ENCOUNTER — Other Ambulatory Visit: Payer: Self-pay | Admitting: Orthopedic Surgery

## 2022-08-29 DIAGNOSIS — Z96651 Presence of right artificial knee joint: Secondary | ICD-10-CM

## 2022-08-29 DIAGNOSIS — M25461 Effusion, right knee: Secondary | ICD-10-CM

## 2022-09-05 ENCOUNTER — Telehealth: Payer: Self-pay

## 2022-09-05 NOTE — Telephone Encounter (Signed)
115 pm.  Patient discharged from hospice services last week and was referred to Palliative Care.  Phone call made to patient to schedule home visit.  No answer. Message left requesting a call back.  Phone call made to son Renae Fickle to schedule a home visit.  He advised patient has some upcoming appointments and he will have patient call me directly to schedule.

## 2022-09-05 NOTE — Telephone Encounter (Signed)
130 pm.  Incoming all from patient.  Home visit scheduled for 09/09/22.

## 2022-09-07 ENCOUNTER — Ambulatory Visit
Admission: RE | Admit: 2022-09-07 | Discharge: 2022-09-07 | Disposition: A | Discharge status: Home / Self Care | Payer: Medicare Other | Source: Ambulatory Visit | Attending: Orthopedic Surgery | Admitting: Orthopedic Surgery

## 2022-09-07 DIAGNOSIS — M25461 Effusion, right knee: Secondary | ICD-10-CM | POA: Insufficient documentation

## 2022-09-07 DIAGNOSIS — Z96651 Presence of right artificial knee joint: Secondary | ICD-10-CM | POA: Insufficient documentation

## 2022-09-09 ENCOUNTER — Other Ambulatory Visit: Payer: Medicare Other

## 2022-09-09 VITALS — BP 112/50 | HR 71 | Temp 97.6°F | Wt 185.0 lb

## 2022-09-09 DIAGNOSIS — Z515 Encounter for palliative care: Secondary | ICD-10-CM

## 2022-09-09 NOTE — Progress Notes (Signed)
PATIENT NAME: Randall Vincent DOB: Jul 01, 1940 MRN: [id number]  PRIMARY CARE PROVIDER: Elspeth Cho., MD  RESPONSIBLE PARTY:  Acct ID - Guarantor Home Phone Work Phone Relationship Acct Type  [id number] [name], [name]* [phone]  Self P/F     [address], [place], [place] [address]   Home visit completed with patient and daughter-in-law [name].  Hospice:  Patient discharged from hospice services due to stability on 09/01/22.  Currently working to get re-established with previous providers.  Has been seen by PCP, Cardiology and Orthopedics.  Awaiting ophthalmology consult for possible cataract removal.  Patient denies any new concerns since his discharge from hospice last week.   CHF:  Gradual increase in weights over the last month. See weights below.  He notes improvement in appetite and is uncertain if this is fluid or true weight gain. 1+ bilateral lower extremity edema.  Patient reports some abdominal fullness.  Patient notes shortness of breath when ambulating long distances but states this has been ongoing.  Sleeping in a recliner chair at night and keeps head elevated.  Patient advised he has not been able to sleep flat in years.  Demadex was recently decreased to 1 tablet daily but patient advised he has increased to 2x daily and continues with metolazone bid. Patient to contact cardiology if weight continues to increase.  Education provided on HF.   Home Weights:   07/18/22- 169.5 lbs 07/22/22- 170.3 lbs 08/17/22-176 lbs 08/17/21-175 lbs Now 185 lbs  Functional Status:  Has a rolator in the home.  No falls in a year.  Patient is able bath and dress himself.  Family is assisting with meals and taking him to appointments.  Resource:  CHF book provided to patient.   Safety:  Discussed life alert as he lives alone.  Patient will contact [employer] to inquire about a life alert system.  Patient is currently living in his home but he is wanting to return to his camper in his backyard.  Patient  typically lives in the camper but started living in the house when he was discharged from the hospital and started under hospice services.  He is hopeful he can return to the camper in the future.   Follow up visit scheduled for 09/30/23 @ 930 am.    CODE STATUS: DNR ADVANCED DIRECTIVES: Yes MOST FORM: Yes PPS: 40%   PHYSICAL EXAM:   VITALS: Today's Vitals   09/09/22 1339  BP: (!) 112/50  Pulse: 71  Temp: 97.6 F (36.4 C)  SpO2: 99%  Weight: 185 lb (83.9 kg)    LUNGS: clear to auscultation  CARDIAC: Cor RRR}  EXTREMITIES: 1+ bilateral pitting edema SKIN: Skin color, texture, turgor normal. No rashes or lesions or     NEURO: positive for gait problems       Truitt Merle, RN

## 2022-09-12 ENCOUNTER — Telehealth: Payer: Self-pay

## 2022-09-12 NOTE — Telephone Encounter (Signed)
1024 am.  Phone call made to patient to follow up on weight.  No changes remaining per patient.  He feels some of the weight gain is related to eating better but also has some fluid.  No changes in his breathing.  Patient denies any new changes or concerns.  I will follow up with him again next month for a home visit.

## 2022-09-28 ENCOUNTER — Telehealth: Payer: Self-pay

## 2022-09-28 NOTE — Telephone Encounter (Signed)
1449 Palliative Care Note  RN called and left voice mail cancelling appt for Friday am. RN Manson Passey will call to reschedule future visit. Left contact info along with a request to return call if any issues.   Barbette Merino, RN

## 2022-09-30 ENCOUNTER — Other Ambulatory Visit: Payer: Medicare Other

## 2022-10-13 ENCOUNTER — Other Ambulatory Visit: Payer: Self-pay

## 2022-10-13 DIAGNOSIS — E1122 Type 2 diabetes mellitus with diabetic chronic kidney disease: Secondary | ICD-10-CM

## 2022-10-13 DIAGNOSIS — N1832 Chronic kidney disease, stage 3b: Secondary | ICD-10-CM

## 2022-10-13 DIAGNOSIS — N179 Acute kidney failure, unspecified: Secondary | ICD-10-CM

## 2022-10-20 ENCOUNTER — Ambulatory Visit
Admission: RE | Admit: 2022-10-20 | Discharge: 2022-10-20 | Disposition: A | Discharge status: Home / Self Care | Payer: Medicare Other | Source: Ambulatory Visit | Attending: Nephrology | Admitting: Nephrology

## 2022-10-20 DIAGNOSIS — N179 Acute kidney failure, unspecified: Secondary | ICD-10-CM | POA: Insufficient documentation

## 2022-10-20 DIAGNOSIS — N1832 Chronic kidney disease, stage 3b: Secondary | ICD-10-CM | POA: Insufficient documentation

## 2022-10-20 DIAGNOSIS — E1122 Type 2 diabetes mellitus with diabetic chronic kidney disease: Secondary | ICD-10-CM | POA: Insufficient documentation

## 2022-10-27 ENCOUNTER — Telehealth: Payer: Self-pay

## 2022-10-27 NOTE — Telephone Encounter (Signed)
150 pm.  Phone call made to patient to follow up on overall status and schedule next home visit.  Message left requesting a call back.  Tentatively scheduled patient for 12/01/22 @ 9 am.

## 2022-11-11 ENCOUNTER — Other Ambulatory Visit: Payer: Medicare Other

## 2022-11-11 DIAGNOSIS — Z515 Encounter for palliative care: Secondary | ICD-10-CM

## 2022-11-11 NOTE — Progress Notes (Signed)
PATIENT NAME: Randall Vincent DOB: Mar 31, 1941 MRN: [id number]  PRIMARY CARE PROVIDER: Elspeth Cho., MD  RESPONSIBLE PARTY:  Acct ID - Guarantor Home Phone Work Phone Relationship Acct Type  [id number] [name], [name]* [phone]  Self P/F     [address], [place], [place] [address]    I connected with  Randall Vincent on 11/11/22 by telephone and verified that I am speaking with the correct person using two identifiers.   I discussed the limitations of evaluation and management by telemedicine. The patient expressed understanding and agreed to proceed.   Follow up call made to patient to confirm visit on 7/11 @ 9 am.  Patient is agreeable.   Advises overall he has been doing well.  Still living in his son's home but hopes to return to the camper behind the house one day.  Cardiology has cleared patient for knee replacement but patient has placed this on hold.  He has been seen by nephrology and there is concern over his kidney functions.  He believes he will be seen by hematology at some point.   Patient is scheduled for a follow up visit with PCP on this Sunday.  Patient denies any new concerns at this time and welcomes a follow up visit next month.       Truitt Merle, RN

## 2022-12-01 ENCOUNTER — Other Ambulatory Visit: Payer: Medicare Other

## 2022-12-06 ENCOUNTER — Encounter (INDEPENDENT_AMBULATORY_CARE_PROVIDER_SITE_OTHER): Payer: Medicare Other | Admitting: Ophthalmology

## 2022-12-06 DIAGNOSIS — H25811 Combined forms of age-related cataract, right eye: Secondary | ICD-10-CM

## 2022-12-06 DIAGNOSIS — H26122 Partially resolved traumatic cataract, left eye: Secondary | ICD-10-CM

## 2022-12-06 DIAGNOSIS — I10 Essential (primary) hypertension: Secondary | ICD-10-CM

## 2022-12-06 DIAGNOSIS — E119 Type 2 diabetes mellitus without complications: Secondary | ICD-10-CM

## 2022-12-06 DIAGNOSIS — H35033 Hypertensive retinopathy, bilateral: Secondary | ICD-10-CM

## 2022-12-06 DIAGNOSIS — H5452A1 Low vision left eye category 1, normal vision right eye: Secondary | ICD-10-CM

## 2022-12-06 DIAGNOSIS — H353211 Exudative age-related macular degeneration, right eye, with active choroidal neovascularization: Secondary | ICD-10-CM

## 2022-12-06 DIAGNOSIS — H50112 Monocular exotropia, left eye: Secondary | ICD-10-CM

## 2023-01-03 ENCOUNTER — Other Ambulatory Visit
Admission: RE | Admit: 2023-01-03 | Discharge: 2023-01-03 | Disposition: A | Discharge status: Home / Self Care | Payer: Medicare Other | Source: Ambulatory Visit | Attending: Cardiovascular Disease | Admitting: Cardiovascular Disease

## 2023-01-03 DIAGNOSIS — I5020 Unspecified systolic (congestive) heart failure: Secondary | ICD-10-CM | POA: Insufficient documentation

## 2023-01-03 DIAGNOSIS — Z79899 Other long term (current) drug therapy: Secondary | ICD-10-CM | POA: Insufficient documentation

## 2023-01-03 LAB — BRAIN NATRIURETIC PEPTIDE: B Natriuretic Peptide: 476.9 pg/mL — ABNORMAL HIGH (ref 0.0–100.0)

## 2023-02-12 IMAGING — DX DG CHEST 1V PORT
2 series · 2 of 2 positions shown · non-contrast
Comparison: 02/05/2020

CLINICAL DATA: Chest discomfort, worsening CHF

EXAM:
PORTABLE CHEST 1 VIEW

[chest ap (1 of 2)]
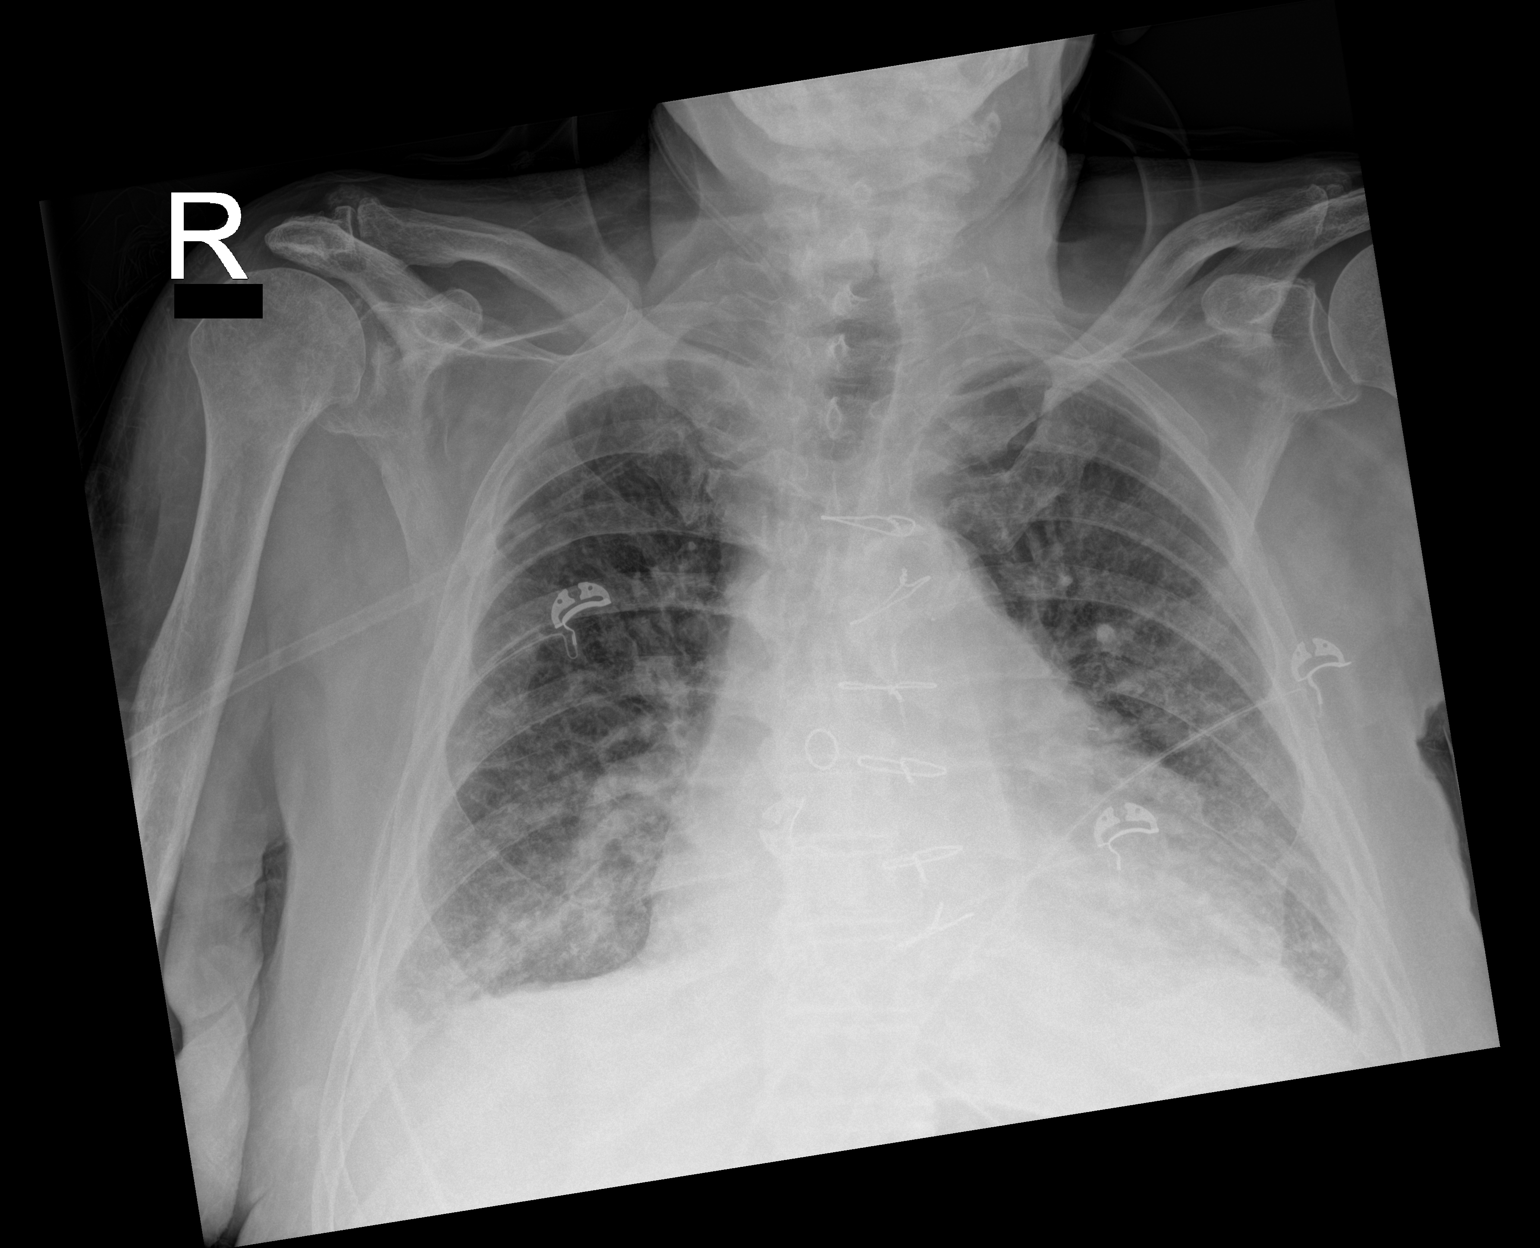

[chest ap (2 of 2)]
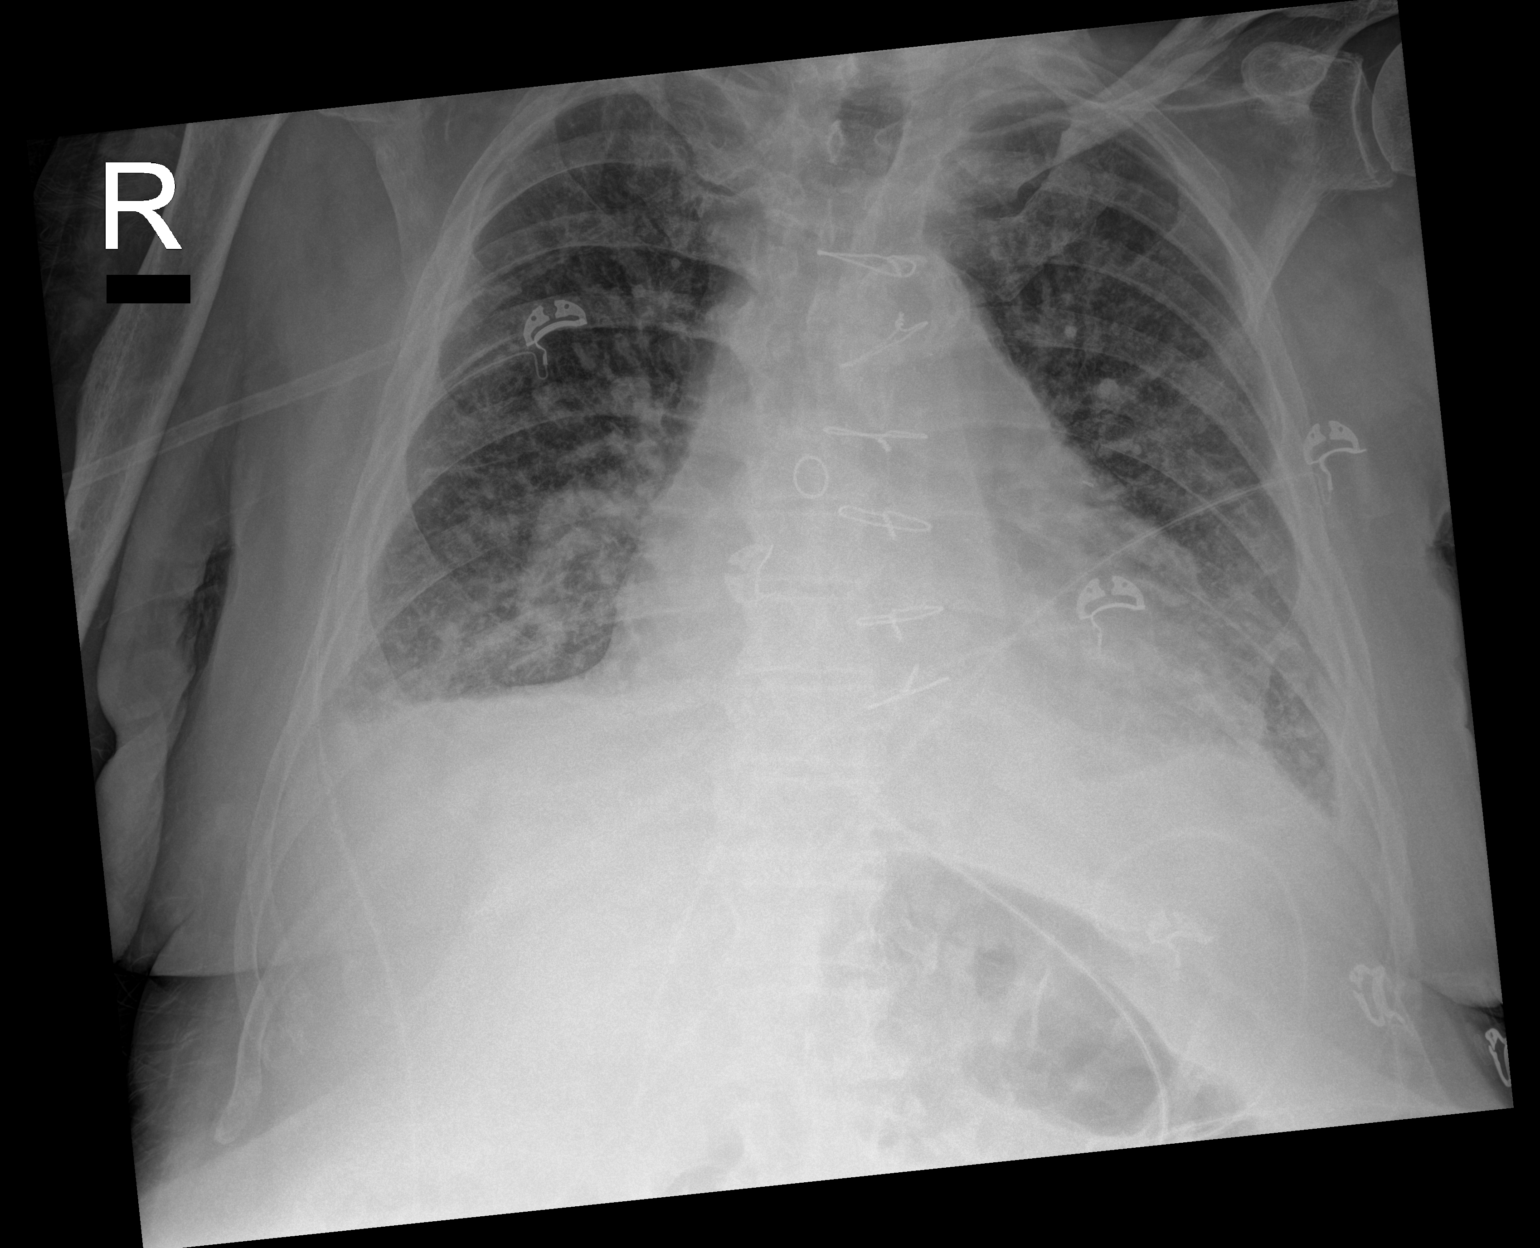

[2 of 2 positions shown; findings below may reference images not displayed]

FINDINGS: Status post median sternotomy and CABG. Cardiomegaly with vascular
congestion and bilateral interstitial opacities, most likely
pulmonary edema. Small right pleural effusion. Possible trace left
pleural effusion. No pneumothorax. No acute osseous abnormality.
IMPRESSION: Cardiomegaly, with vascular congestion, pulmonary edema, and small
right pleural effusion.

## 2023-07-10 ENCOUNTER — Emergency Department: Payer: Medicare Other

## 2023-07-10 ENCOUNTER — Emergency Department
Admission: EM | Admit: 2023-07-10 | Discharge: 2023-07-11 | Disposition: A | Discharge status: Home / Self Care | Payer: Medicare Other | Attending: Emergency Medicine | Admitting: Emergency Medicine

## 2023-07-10 ENCOUNTER — Other Ambulatory Visit: Payer: Self-pay

## 2023-07-10 DIAGNOSIS — R059 Cough, unspecified: Secondary | ICD-10-CM | POA: Insufficient documentation

## 2023-07-10 DIAGNOSIS — I509 Heart failure, unspecified: Secondary | ICD-10-CM | POA: Insufficient documentation

## 2023-07-10 DIAGNOSIS — I501 Left ventricular failure: Secondary | ICD-10-CM | POA: Diagnosis not present

## 2023-07-10 DIAGNOSIS — N189 Chronic kidney disease, unspecified: Secondary | ICD-10-CM | POA: Diagnosis not present

## 2023-07-10 DIAGNOSIS — I517 Cardiomegaly: Secondary | ICD-10-CM | POA: Diagnosis not present

## 2023-07-10 DIAGNOSIS — I4891 Unspecified atrial fibrillation: Secondary | ICD-10-CM | POA: Insufficient documentation

## 2023-07-10 DIAGNOSIS — D649 Anemia, unspecified: Secondary | ICD-10-CM | POA: Diagnosis not present

## 2023-07-10 DIAGNOSIS — R7989 Other specified abnormal findings of blood chemistry: Secondary | ICD-10-CM | POA: Insufficient documentation

## 2023-07-10 DIAGNOSIS — R531 Weakness: Secondary | ICD-10-CM | POA: Diagnosis present

## 2023-07-10 LAB — COMPREHENSIVE METABOLIC PANEL
ALT: 21 U/L (ref 0–44)
AST: 21 U/L (ref 15–41)
Albumin: 2.5 g/dL — ABNORMAL LOW (ref 3.5–5.0)
Alkaline Phosphatase: 92 U/L (ref 38–126)
Anion gap: 11 (ref 5–15)
BUN: 85 mg/dL — ABNORMAL HIGH (ref 8–23)
CO2: 28 mmol/L (ref 22–32)
Calcium: 8.7 mg/dL — ABNORMAL LOW (ref 8.9–10.3)
Chloride: 102 mmol/L (ref 98–111)
Creatinine, Ser: 2.33 mg/dL — ABNORMAL HIGH (ref 0.61–1.24)
GFR, Estimated: 27 mL/min — ABNORMAL LOW (ref >60)
Glucose, Bld: 153 mg/dL — ABNORMAL HIGH (ref 70–99)
Potassium: 4.1 mmol/L (ref 3.5–5.1)
Sodium: 141 mmol/L (ref 135–145)
Total Bilirubin: 0.8 mg/dL (ref 0.0–1.2)
Total Protein: 7.3 g/dL (ref 6.5–8.1)

## 2023-07-10 LAB — CBC WITH DIFFERENTIAL/PLATELET
Abs Immature Granulocytes: 0.06 10*3/uL (ref 0.00–0.07)
Basophils Absolute: 0 10*3/uL (ref 0.0–0.1)
Basophils Relative: 0 %
Eosinophils Absolute: 0.1 10*3/uL (ref 0.0–0.5)
Eosinophils Relative: 1 %
HCT: 25.6 % — ABNORMAL LOW (ref 39.0–52.0)
Hemoglobin: 7.7 g/dL — ABNORMAL LOW (ref 13.0–17.0)
Immature Granulocytes: 1 %
Lymphocytes Relative: 4 %
Lymphs Abs: 0.3 10*3/uL — ABNORMAL LOW (ref 0.7–4.0)
MCH: 30.1 pg (ref 26.0–34.0)
MCHC: 30.1 g/dL (ref 30.0–36.0)
MCV: 100 fL (ref 80.0–100.0)
Monocytes Absolute: 0.4 10*3/uL (ref 0.1–1.0)
Monocytes Relative: 5 %
Neutro Abs: 6.5 10*3/uL (ref 1.7–7.7)
Neutrophils Relative %: 89 %
Platelets: 227 10*3/uL (ref 150–400)
RBC: 2.56 MIL/uL — ABNORMAL LOW (ref 4.22–5.81)
RDW: 17.8 % — ABNORMAL HIGH (ref 11.5–15.5)
WBC: 7.4 10*3/uL (ref 4.0–10.5)
nRBC: 0 % (ref 0.0–0.2)

## 2023-07-10 LAB — URINALYSIS, W/ REFLEX TO CULTURE (INFECTION SUSPECTED)
Bacteria, UA: NONE SEEN
Bilirubin Urine: NEGATIVE
Glucose, UA: NEGATIVE mg/dL
Hgb urine dipstick: NEGATIVE
Ketones, ur: NEGATIVE mg/dL
Leukocytes,Ua: NEGATIVE
Nitrite: NEGATIVE
Protein, ur: NEGATIVE mg/dL
Specific Gravity, Urine: 1.011 (ref 1.005–1.030)
Squamous Epithelial / HPF: 0 /[HPF] (ref 0–5)
pH: 6 (ref 5.0–8.0)

## 2023-07-10 LAB — LACTIC ACID, PLASMA: Lactic Acid, Venous: 1.5 mmol/L (ref 0.5–1.9)

## 2023-07-10 LAB — RESP PANEL BY RT-PCR (RSV, FLU A&B, COVID)  RVPGX2
Influenza A by PCR: NEGATIVE
Influenza B by PCR: NEGATIVE
Resp Syncytial Virus by PCR: NEGATIVE
SARS Coronavirus 2 by RT PCR: NEGATIVE

## 2023-07-10 LAB — PREPARE RBC (CROSSMATCH)

## 2023-07-10 LAB — PROTIME-INR
INR: 1.3 — ABNORMAL HIGH (ref 0.8–1.2)
Prothrombin Time: 16.4 s — ABNORMAL HIGH (ref 11.4–15.2)

## 2023-07-10 MED ORDER — SODIUM CHLORIDE 0.9 % IV SOLN
10.0000 mL/h | Freq: Once | INTRAVENOUS | Status: AC
  Administered 2023-07-10: 10 mL/h via INTRAVENOUS

## 2023-07-10 NOTE — ED Provider Triage Note (Signed)
Emergency Medicine Provider Triage Evaluation Note  Randall Vincent , a 83 y.o. male  was evaluated in triage.  Pt complains of low hgb, called by [facility] to come to ER, also bad cough.  Review of Systems  Positive:  Negative:   Physical Exam  There were no vitals taken for this visit. Gen:   Awake, no distress   Resp:  Normal effort  MSK:   Moves extremities without difficulty  Other:    Medical Decision Making  Medically screening exam initiated at 12:32 PM.  Appropriate orders placed.  Randall Vincent was informed that the remainder of the evaluation will be completed by another provider, this initial triage assessment does not replace that evaluation, and the importance of remaining in the ED until their evaluation is complete.     Randall Ghee, PA-C 07/10/23 1233

## 2023-07-10 NOTE — ED Triage Notes (Signed)
Pt sts that he has been having a cough for awhile as well as being notified that he has low hgb from the pharmacy at [facility].

## 2023-07-10 NOTE — ED Provider Notes (Signed)
-----------------------------------------   11:03 PM on 07/10/2023 -----------------------------------------  Blood pressure (!) 121/47, pulse 62, temperature 98 F (36.7 C), temperature source Oral, resp. rate 16, height 6' (1.829 m), weight 95.3 kg, SpO2 99%.  Assuming care from Dr. Vicente Males.  In short, Randall Vincent is a 83 y.o. male with a chief complaint of Abnormal Labs .  Refer to the original H&P for additional details.  The current plan of care is to reassess following transfusion, planc for dc home afterwards.  ----------------------------------------- 12:08 AM on 07/11/2023 ----------------------------------------- Blood transfusion completed and patient denies any complaints on reassessment.  He is appropriate for discharge home with outpatient PCP follow-up for recheck of blood counts.  Refill of albuterol for his nebulizer machine prescribed as requested, patient and family counseled to return to the ED for new or worsening symptoms.  Patient agrees with plan.         Chesley Noon, MD 07/11/23 [phone]

## 2023-07-10 NOTE — ED Triage Notes (Signed)
Pt comes via EMS from home with c/o abnormal labs. Pt was called by Pharmacy earlier today and said it was 6.4 unsure what lab. Pt did recent surgery.  Pt has URI. Pt was placed on 2L for assistance.

## 2023-07-10 NOTE — ED Provider Notes (Signed)
[facility] Provider Note   Event Date/Time   First MD Initiated Contact with Patient 07/10/23 1802     (approximate) History  Abnormal Labs  HPI Braiden Rodman is a 83 y.o. male with a past medical history of atrial fibrillation, CHF, hardware infection of the right hip, and chronic kidney disease who presents after being called by his [facility] laboratory that his hemoglobin was low and possibly need to be transfused.  Patient states that he is feeling generalized weakness however he relates this to being in bed for the last few weeks secondary to this infection.  Patient also endorses chronic cough productive of clear sputum that has been present for months.  Patient denies any increase in his weakness or shortness of breath since his hemoglobin was checked on 06/26/23 and was found to be 7.2 ROS: Patient currently denies any vision changes, tinnitus, difficulty speaking, facial droop, sore throat, chest pain, shortness of breath, abdominal pain, nausea/vomiting/diarrhea, dysuria, or numbness/paresthesias in any extremity   Physical Exam  Triage Vital Signs: ED Triage Vitals  Encounter Vitals Group     BP 07/10/23 1232 (!) 134/55     Systolic BP Percentile --      Diastolic BP Percentile --      Pulse Rate 07/10/23 1232 (!) 120     Resp 07/10/23 1232 (!) 21     Temp 07/10/23 1232 (!) 97.5 F (36.4 C)     Temp Source 07/10/23 1232 Oral     SpO2 07/10/23 1232 (!) 89 %     Weight 07/10/23 1236 210 lb (95.3 kg)     Height 07/10/23 1236 6' (1.829 m)     Head Circumference --      Peak Flow --      Pain Score 07/10/23 1235 0     Pain Loc --      Pain Education --      Exclude from Growth Chart --    Most recent vital signs: Vitals:   07/10/23 2030 07/10/23 2130  BP: (!) 111/42 (!) 132/92  Pulse: 73 67  Resp: 16   Temp:    SpO2: 100% 100%   General: Awake, oriented x4. CV:  Good peripheral perfusion.  Resp:  Normal effort.  Abd:  No distention.   Other:  Elderly overweight Caucasian male resting comfortably in no acute distress.  Tenderness to palpation and with movement at the right hip ED Results / Procedures / Treatments  Labs (all labs ordered are listed, but only abnormal results are displayed) Labs Reviewed  COMPREHENSIVE METABOLIC PANEL - Abnormal; Notable for the following components:      Result Value   Glucose, Bld 153 (*)    BUN 85 (*)    Creatinine, Ser 2.33 (*)    Calcium 8.7 (*)    Albumin 2.5 (*)    GFR, Estimated 27 (*)    All other components within normal limits  CBC WITH DIFFERENTIAL/PLATELET - Abnormal; Notable for the following components:   RBC 2.56 (*)    Hemoglobin 7.7 (*)    HCT 25.6 (*)    RDW 17.8 (*)    Lymphs Abs 0.3 (*)    All other components within normal limits  PROTIME-INR - Abnormal; Notable for the following components:   Prothrombin Time 16.4 (*)    INR 1.3 (*)    All other components within normal limits  URINALYSIS, W/ REFLEX TO CULTURE (INFECTION SUSPECTED) - Abnormal; Notable for the following components:  Color, Urine YELLOW (*)    APPearance CLEAR (*)    All other components within normal limits  RESP PANEL BY RT-PCR (RSV, FLU A&B, COVID)  RVPGX2  CULTURE, BLOOD (ROUTINE X 2)  CULTURE, BLOOD (ROUTINE X 2)  LACTIC ACID, PLASMA  LACTIC ACID, PLASMA  TYPE AND SCREEN  PREPARE RBC (CROSSMATCH)   EKG ED ECG REPORT I, Merwyn Katos, the attending physician, personally viewed and interpreted this ECG. Date: 07/10/2023 EKG Time: 1244 Rate: 64 Rhythm: Atrial fibrillation QRS Axis: normal Intervals: normal ST/T Wave abnormalities: normal Narrative Interpretation: Atrial fibrillation.  No evidence of acute ischemia RADIOLOGY ED MD interpretation: 2 view chest x-ray shows chronic cardiomegaly and pulmonary edema with chronic pleural effusions though improved compared to last year -Agree with radiology assessment Official radiology report(s): DG Chest 2 View Result Date:  07/10/2023 CLINICAL DATA:  Suspected sepsis.  Cough.  Low hemoglobin. EXAM: CHEST - 2 VIEW COMPARISON:  06/07/2022 FINDINGS: Chronic cardiomegaly. Post median sternotomy. Right central line tip in the mid SVC. Small bilateral pleural effusions, although improved from prior exam. Peribronchial and interstitial thickening may represent pulmonary edema. No confluent airspace disease. No pneumothorax. IMPRESSION: 1. Chronic cardiomegaly.  Pulmonary edema. 2. Pleural effusions, although improved from comparison last year. Electronically Signed   By: Narda Rutherford M.D.   On: 07/10/2023 15:59   PROCEDURES: Critical Care performed: Yes, see critical care procedure note(s) Procedures MEDICATIONS ORDERED IN ED: Medications  0.9 %  sodium chloride infusion (has no administration in time range)   IMPRESSION / MDM / ASSESSMENT AND PLAN / ED COURSE  I reviewed the triage vital signs and the nursing notes.                             The patient is on the cardiac monitor to evaluate for evidence of arrhythmia and/or significant heart rate changes. Patient's presentation is most consistent with acute presentation with potential threat to life or bodily function. Patient is a 83 year old male who presents after being told by his outpatient laboratory that he was anemic at 6.4.  Repeat laboratory assessment here shows hemoglobin is 7.7.  Given patient's history of significant cardiac disease, he will benefit from a transfusion to at least above 8.  Of note, patient's kidney function has slightly improved and states that he is almost done with his IV antibiotics at home for his right hip hardware infection.  I discussed with patient the need for transfusion here and he agrees to 1 unit transfused prior to discharge.  Patient also inquired about home oxygen and was given a [facility] consult for continued care of his heart failure.  Dispo: Discharge home after transfusion   FINAL CLINICAL IMPRESSION(S) / ED DIAGNOSES    Final diagnoses:  Chronic anemia   Rx / DC Orders   ED Discharge Orders          Ordered    AMB Referral [facility] Care Management        Pending           Note:  This document was prepared using Dragon voice recognition software and may include unintentional dictation errors.   Merwyn Katos, MD 07/10/23 2249

## 2023-07-11 LAB — TYPE AND SCREEN
ABO/RH(D): B POS
Antibody Screen: NEGATIVE
Unit division: 0

## 2023-07-11 LAB — BPAM RBC
Blood Product Expiration Date: 202503172359
ISSUE DATE / TIME: 202502171955
Unit Type and Rh: 5100

## 2023-07-11 MED ORDER — ALBUTEROL SULFATE (2.5 MG/3ML) 0.083% IN NEBU: 2.5000 mg | INHALATION_SOLUTION | RESPIRATORY_TRACT | 2 refills | Status: DC | PRN

## 2023-07-11 NOTE — ED Notes (Signed)
[facility] called for transport back to home.

## 2023-07-15 LAB — CULTURE, BLOOD (ROUTINE X 2)
Culture: NO GROWTH
Culture: NO GROWTH

## 2024-03-01 NOTE — Discharge Summary (Addendum)
 Discharge to Outside Facility: RN  Report called to Krystal by Gailen at 1406 Discharge Unit: [id number] Discharge Unit Phone #: [phone]  Transport Equipment/Life Support Measures Needed    IV Access: None  Oxygen: via RA    Pulse Oximeter: SpO2: 100 % (03/01/24 1157)    Drains/Tubes:     Negative Pressure Wound Therapy Right;Anterior Knee (Active)  Therapy in Place NPWT 03/01/24 0848  Dressing Type Other (Comment) 03/01/24 0848  Non-adherent Layer None 02/28/24 0926  Number of Black/ Gray Foam Pieces Used 1 02/26/24 1730  Mode Continuous 03/01/24 0848  Target Pressure (mmHg) -125 03/01/24 0848  Canister Changed No 02/29/24 0837  Dressing Last Changed 02/26/24 03/01/24 0848  Output (mL) 0 mL 02/29/24 0837  Number of days: 4       Precautions/Positioning:    Vital Signs and Patient Condition At Time of Transfer    Vitals:     Vitals:   03/01/24 1157  BP: 126/57  Pulse: 87  Resp: 20  Temp: 36.6 C (97.9 F)     Mental Status:   (WDL = Within Defined Limits)           Orientation Level: Oriented X4 (03/01/24 0848)   Cognition: Appropriate judgement, Appropriate safety awareness, Appropriate attention/concentration (03/01/24 0848)   Communication: Verbal (03/01/24 0848)   Pain:    Pain Assessment %%: 0-10 (03/01/24 1239)    Pain Score %%:   4 (03/01/24 1239)       Nursing Summary    Activities of Daily Living:        Mobility: Slightly limited (03/01/24 0848)   Activity: Chairfast (03/01/24 0848)   Sensory Aides:            Elimination:       Stool Occurrence: 1 (02/29/24 1907)   Urinary Incontinence: No (02/29/24 1400)   Bowel Incontinence: No (03/01/24 0848)   Nutrition:   Does the pt require assistance with eating?: Able to feed self (02/29/24 2145)           Skin Condition / Wounds:     Pressure Injury (Ulcer) 02/26/24 Posterior;Bilateral Buttock (Active)  Wound Assessment Dressing change not due 02/29/24 0837  Peri-wound  Assessment Purple, dark;Purple, light 02/27/24 2000  Drainage Amount None 02/29/24 0837  Dressing Type Other (Comment) 02/26/24 1030  Specialty Dressing Type Sacral shaped silicone foam border;Silicone foam border 03/01/24 0848  Dressing Status/ Intervention Clean, Dry, Intact 03/01/24 0848  Dressing Last Changed 02/27/24 03/01/24 0848  Number of days: 4     Negative Pressure Wound Therapy Right;Anterior Knee (Active)  Therapy in Place NPWT 03/01/24 0848  Dressing Type Other (Comment) 03/01/24 0848  Non-adherent Layer None 02/28/24 0926  Number of Black/ Gray Foam Pieces Used 1 02/26/24 1730  Mode Continuous 03/01/24 0848  Target Pressure (mmHg) -125 03/01/24 0848  Canister Changed No 02/29/24 0837  Dressing Last Changed 02/26/24 03/01/24 0848  Output (mL) 0 mL 02/29/24 0837  Number of days: 4     Wound 05/26/23 Right;Posterior Thigh (Active)  Number of days: 280     Wound 05/26/23 Right;Anterior Knee (Active)  Number of days: 280     Wound 05/26/23 Left Buttock (Active)  Number of days: 280     Wound 05/26/23 Distal;Right;Anterior Leg (Active)  Number of days: 280     Wound 05/30/23 Right Neck/ throat (Active)  Number of days: 276     Wound 05/30/23 Right Clavicle (Active)  Number of days: 276     Wound 02/26/24  Right;Anterior Knee (Active)  Wound Assessment Dressing change not due 02/29/24 0837  Wound Edges clear/ defined 02/26/24 1648  Peri-wound Assessment Other (Comment) 02/29/24 0837  Closure Other (Comment) 02/28/24 0926  Support Polar Care;Ace wrap;Immobilizer 03/01/24 0848  Drainage Amount None 02/29/24 0837  Dressing Type Specialty dressing 03/01/24 0848  Specialty Dressing Type NPWT 03/01/24 0848  Dressing Status/ Intervention Clean, Dry, Intact 03/01/24 0848  Dressing Last Changed 02/26/24 03/01/24 0848  Number of days: 4    Pt A&Ox4, assist with ADLs and set up for meals but feeds self, right leg elevation, prevena with no output, no other concerns  raised.  Signed By: JOSEPHINE B NGARE

## 2024-03-01 NOTE — Progress Notes (Addendum)
 Orthopaedics Progress Note  Post-operative Check    Randall Vincent is a 83 y.o. year old male 4 Days Post-Op s/p right rTKA with Dr. Rica  Interval:  Resting this AM, pain controlled.  NAEON, SBP better controlled after 1pRBC. Plan for SNF today.   Physical Exam: NAD  right lower extremity Prevena c/d/I. HKB at bedside Motor: EHL/FHL/DF/PF intact Sensory: S/S/SP/DP/T Intact Vascular: Palpable distal pulse and Cap refill <2 sec  Assessment Randall Vincent is 83 y.o. male who is 4 Days Post-Op status post right rTKA in expected and stable condition post-operatively. Will touch base w/ medicine re: restarting home BP meds  Plan  DIET: carb controlled PAIN:  regional block and oral medications with IV breakthrough DVT PPx: ASA BID POD1  PT/OT: TTWB RLE, HKB locked in extension when ambulating  ANTIBIOTICS: ancef  pending OR cx   - Plan to DC on Duricef for 30 days since OR cx NGTD DISPO: SNF today   Please do not hesitate to page [phone] with any questions.   Oddis Cohn, MD  Adult Reconstruction Fellow [facility]

## 2024-03-04 ENCOUNTER — Encounter: Payer: Self-pay | Admitting: Adult Health

## 2024-03-04 ENCOUNTER — Non-Acute Institutional Stay (SKILLED_NURSING_FACILITY): Admitting: Adult Health

## 2024-03-04 DIAGNOSIS — I5020 Unspecified systolic (congestive) heart failure: Secondary | ICD-10-CM

## 2024-03-04 DIAGNOSIS — T8450XD Infection and inflammatory reaction due to unspecified internal joint prosthesis, subsequent encounter: Secondary | ICD-10-CM | POA: Diagnosis not present

## 2024-03-04 DIAGNOSIS — D62 Acute posthemorrhagic anemia: Secondary | ICD-10-CM

## 2024-03-04 DIAGNOSIS — T8450XS Infection and inflammatory reaction due to unspecified internal joint prosthesis, sequela: Secondary | ICD-10-CM | POA: Diagnosis not present

## 2024-03-04 DIAGNOSIS — I480 Paroxysmal atrial fibrillation: Secondary | ICD-10-CM | POA: Diagnosis not present

## 2024-03-04 DIAGNOSIS — M1A9XX1 Chronic gout, unspecified, with tophus (tophi): Secondary | ICD-10-CM | POA: Diagnosis not present

## 2024-03-04 DIAGNOSIS — N184 Chronic kidney disease, stage 4 (severe): Secondary | ICD-10-CM

## 2024-03-04 LAB — CBC: RBC: 2.81 — AB (ref 3.87–5.11)

## 2024-03-04 LAB — BASIC METABOLIC PANEL WITH GFR
BUN: 90 — AB (ref 4–21)
CO2: 21 (ref 13–22)
Chloride: 110 — AB (ref 99–108)
Creatinine: 2.1 — AB (ref 0.6–1.3)
Glucose: 112
Potassium: 4 meq/L (ref 3.5–5.1)
Sodium: 140 (ref 137–147)

## 2024-03-04 LAB — CBC AND DIFFERENTIAL
HCT: 28 — AB (ref 41–53)
Hemoglobin: 8.8 — AB (ref 13.5–17.5)
Neutrophils Absolute: 4115
Platelets: 151 K/uL (ref 150–400)
WBC: 5.4

## 2024-03-04 LAB — COMPREHENSIVE METABOLIC PANEL WITH GFR
Calcium: 7.4 — AB (ref 8.7–10.7)
eGFR: 30

## 2024-03-04 NOTE — Progress Notes (Signed)
 Location:  Other [facility]) Nursing Home Room Number: [place] [facility]) Place of Service:  SNF ([phone]) Provider:  Medina-Vargas, Tray Klayman, DNP, FNP-BC  Patient Care Team: Ardean Ronnald BRAVO., MD as PCP - General (Internal Medicine)  Extended Emergency Contact Information Primary Emergency Contact: Cartier,Paul Mobile Phone: [phone] Relation: Son Secondary Emergency Contact: Pait,Terri Mobile Phone: [phone] Relation: Daughter  Code Status:  DNR  Goals of care: Advanced Directive information    03/04/2024   10:49 AM  Advanced Directives  Does Patient Have a Medical Advance Directive? Yes  Type of Estate agent of [place];Living will;Out of facility DNR (pink MOST or yellow form)  Does patient want to make changes to medical advance directive? No - Patient declined  Copy of Healthcare Power of Attorney in Chart? Yes - validated most recent copy scanned in chart (See row information)  Pre-existing out of facility DNR order (yellow form or pink MOST form) Pink MOST form placed in chart (order not valid for inpatient use);Yellow form placed in chart (order not valid for inpatient use)     Chief Complaint  Patient presents with   New Admit To SNF    New admission     HPI:  Pt is a 83 y.o. male seen today to follow up admission to [facility] on 03/01/24. He was hospitalized at [facility] on 02/26/2024 to 03/01/2024 for infection of prosthetic joint.  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  On 02/26/2024 he underwent right revision of total knee arthroplasty.  He has been admitted to [facility] for a short-term rehabilitation.  Past Medical History:  Diagnosis Date   A-fib [facility])    a.) CHA2DS2-VASc = 6 (age x 2, CHF, HTN, previous MI, T2DM). b.) Rate/rhythm maintained on amiodarone  + diltiazem  + carvedilol ; chronically anticoagulated using dabigatran . c.) DCCV 07/10/2015, 08/08/2019, 10/22/2019.   Arthritis    Chest pain 06/26/2019   CHF (congestive heart failure) (HCC)    CKD (chronic kidney disease), stage III (HCC)    Coronary artery disease 1991   a.) 2v CABG in 1991. b.) PCI 06/07/2006 --> Vision stents to native LCx and RCA   Hyperlipidemia    Hypertension    Long term current use of anticoagulant    a.) dabigatran    Myocardial infarction (HCC) 1991   a.) LHC --> 50% and 90% LAD lesions; referred to CVTS. 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   a.) LIMA-LAD, SVG-RCA   T2DM (type 2 diabetes mellitus) (HCC)    Past Surgical History:  Procedure Laterality Date   BACK SURGERY  1970   L5-L6 ruptured disk   BLADDER SURGERY  2000   CARDIOVERSION N/A 07/10/2015   CARDIOVERSION N/A 08/08/2019   CARDIOVERSION N/A 10/22/2019   COLON SURGERY     COLONOSCOPY N/A 01/15/2020   Procedure: COLONOSCOPY;  Surgeon: Janalyn Keene NOVAK, MD;  Location: [facility] ENDOSCOPY;  Service: Endoscopy;  Laterality: N/A;   COLONOSCOPY N/A 01/16/2020   Procedure: COLONOSCOPY;  Surgeon: Janalyn Keene NOVAK, MD;  Location: [facility] ENDOSCOPY;  Service: Endoscopy;  Laterality: N/A;   CORONARY ARTERY  BYPASS GRAFT N/A 01/02/1990   Procedure: 2v CORONARY ARTERY BYPASS GRAFT (LIMA-LAD, SVG-RCA)   ESOPHAGOGASTRODUODENOSCOPY N/A 01/13/2020   Procedure: ESOPHAGOGASTRODUODENOSCOPY (EGD);  Surgeon: Janalyn Keene NOVAK, MD;  Location: [facility] ENDOSCOPY;  Service: Endoscopy;  Laterality: N/A;   ESOPHAGOGASTRODUODENOSCOPY (EGD) WITH PROPOFOL  N/A 06/10/2022   Procedure: ESOPHAGOGASTRODUODENOSCOPY (EGD) WITH PROPOFOL ;  Surgeon: Maryruth Ole DASEN, MD;   Location: [facility] ENDOSCOPY;  Service: Endoscopy;  Laterality: N/A;   REPLACEMENT TOTAL KNEE BILATERAL Bilateral 2008   STENT PLACEMENT VASCULAR ([facility] HX)  2008   TOTAL KNEE REVISION Left 01/16/2020   Procedure: TOTAL KNEE REVISION;  Surgeon: Leora Lynwood SAUNDERS, MD;  Location: [facility] ORS;  Service: Orthopedics;  Laterality: Left;   TOTAL KNEE REVISION Left 05/31/2021   Procedure: TOTAL KNEE REVISION;  Surgeon: Leora Lynwood SAUNDERS, MD;  Location: [facility] ORS;  Service: Orthopedics;  Laterality: Left;    No Known Allergies  Outpatient Encounter Medications as of 03/04/2024  Medication Sig   acetaminophen  (TYLENOL ) 500 MG tablet Take 1,000 mg by mouth 3 (three) times daily. Give 2 tablet by mouth three times a day for pain regimen for 10 Days   albuterol  (PROVENTIL ) (2.5 MG/3ML) 0.083% nebulizer solution Take 3 mLs (2.5 mg total) by nebulization every 4 (four) hours as needed for wheezing or shortness of breath.   allopurinol (ZYLOPRIM) 100 MG tablet Take 100 mg by mouth daily.   amiodarone  (PACERONE ) 100 MG tablet Take 100 mg by mouth daily. Take 1 tablet (100 mg total) by mouth once daily for 30 days   amoxicillin (AMOXIL) 500 MG capsule Take 500 mg by mouth every 12 (twelve) hours. Take 1 capsule (500 mg total) by mouth every 12 (twelve) hours for post surgical Prophylaxis for 30 days   aspirin  81 MG chewable tablet Chew 81 mg by mouth in the morning and at bedtime. Take 1 tablet (81 mg total) by mouth 2 (two) times daily for DVT prophylaxis for 30 days   atorvastatin  (LIPITOR) 40 MG tablet Take 40 mg by mouth daily.   carvedilol  (COREG ) 12.5 MG tablet Take 6.25 mg by mouth 2 (two) times daily. Take 0.5 tablets (6.25 mg total) by mouth 2 (two) times daily   empagliflozin (JARDIANCE) 10 MG TABS tablet Take 10 mg by mouth daily.   guaiFENesin -dextromethorphan (ROBITUSSIN DM) 100-10 MG/5ML syrup Take 5 mLs by mouth every 4 (four) hours as needed for cough.   Multiple Vitamin (QUINTABS) TABS Take 1 tablet by  mouth daily. Take 1 tablet by mouth once daily for 30 days   ondansetron  (ZOFRAN -ODT) 4 MG disintegrating tablet Take 4 mg by mouth every 6 (six) hours as needed for nausea or vomiting. Take 1 tablet (4 mg total) by mouth every 6 (six) hours as needed for Nausea for up to 7 days   oxyCODONE  (OXY IR/ROXICODONE ) 5 MG immediate release tablet Take 5-10 mg by mouth every 4 (four) hours as needed for severe pain (pain score 7-10) or moderate pain (pain score 4-6). Take 1-2 tablets (5-10 mg total) by mouth every 4 (four) hours as needed for up to 5 days   pantoprazole  (PROTONIX ) 40 MG tablet Take 40 mg by mouth daily.   polyethylene glycol (MIRALAX  / GLYCOLAX ) 17 g packet Take 17 g by mouth. Take 1 packet (17 g total) by mouth 2 (two) times daily for 7 days Mix in 4-8ounces of fluid prior to taking.   senna-docusate (SENOKOT-S) 8.6-50 MG tablet Take 2 tablets by mouth 2 (two) times daily. Take 2 tablets by mouth  2 (two) times daily for 7 days   triamcinolone ointment (KENALOG) 0.1 % Apply 1 Application topically as directed. Apply to rash on buttocks topically every 24 hours as needed for rash   [DISCONTINUED] amiodarone  (PACERONE ) 200 MG tablet Take 200 mg by mouth daily.   PROAIR  RESPICLICK 108 (90 Base) MCG/ACT AEPB Inhale into the lungs. (Patient not taking: Reported on 03/04/2024)   Semaglutide , 1 MG/DOSE, 2 MG/1.5ML SOPN Inject 1 mg into the skin every Thursday. (Patient not taking: Reported on 03/04/2024)   [DISCONTINUED] carvedilol  (COREG ) 3.125 MG tablet Take 1 tablet (3.125 mg total) by mouth 2 (two) times daily.   [DISCONTINUED] pantoprazole  (PROTONIX ) 40 MG tablet Take 1 tablet (40 mg total) by mouth daily.   [DISCONTINUED] torsemide  (DEMADEX ) 20 MG tablet Take 1 tablet (20 mg total) by mouth 2 (two) times daily. (Patient not taking: Reported on 03/04/2024)   No facility-administered encounter medications on file as of 03/04/2024.    Review of Systems  Constitutional:  Negative for activity  change, appetite change and fever.  HENT:  Negative for sore throat.   Eyes: Negative.   Cardiovascular:  Negative for chest pain and leg swelling.  Gastrointestinal:  Negative for abdominal distention, diarrhea and vomiting.  Genitourinary:  Negative for dysuria, frequency and urgency.  Skin:  Negative for color change.  Neurological:  Negative for dizziness and headaches.  Psychiatric/Behavioral:  Negative for behavioral problems and sleep disturbance. The patient is not nervous/anxious.     Immunization History  Administered Date(s) Administered    sv, Bivalent, Protein Subunit Rsvpref,pf (Abrysvo) 05/07/2022   Fluzone Influenza virus vaccine,trivalent (IIV3), split virus 04/03/2014   INFLUENZA, HIGH DOSE SEASONAL PF 03/04/2016, 02/18/2017, 02/16/2018, 02/07/2019, 02/07/2019, 03/19/2020, 05/02/2021, 05/01/2022, 02/19/2023   Influenza,trivalent, recombinat, inj, PF 04/03/2014   Influenza-Unspecified 04/14/2015, 04/14/2015, 02/21/2023   Moderna Sars-Covid-2 Vaccination 02/21/2023   PFIZER(Purple Top)SARS-COV-2 Vaccination 06/14/2019, 07/05/2019, 03/19/2020   Pneumococcal Conjugate-13 08/30/2013, 08/30/2013, 04/14/2015, 04/14/2015   Pneumococcal Polysaccharide-23 02/07/2007   Td 11/03/2011   Td (Adult),5 Lf Tetanus Toxid, Preservative Free 11/03/2011   Tdap 02/07/2007, 08/27/2021   Zoster, Live 05/10/2011, 05/10/2011   Pertinent  Health Maintenance Due  Topic Date Due   FOOT EXAM  Never done   OPHTHALMOLOGY EXAM  Never done   HEMOGLOBIN A1C  11/29/2021   Influenza Vaccine  12/22/2023      06/03/2022    8:31 PM 06/04/2022    8:00 AM 06/04/2022    9:26 PM 06/05/2022    9:00 AM 06/05/2022    9:38 PM  Fall Risk  (RETIRED) Patient Fall Risk Level High fall risk  Moderate fall risk  High fall risk  High fall risk  High fall risk      Data saved with a previous flowsheet row definition     Vitals:   03/04/24 1135  BP: (!) 112/57  Pulse: 63  Resp: 16  Temp: 97.9 F (36.6 C)   SpO2: 98%  Weight: 214 lb (97.1 kg)  Height: 6' (1.829 m)   Body mass index is 29.02 kg/m.  Physical Exam Constitutional:      Appearance: Normal appearance.  HENT:     Head: Normocephalic and atraumatic.     Mouth/Throat:     Mouth: Mucous membranes are moist.  Eyes:     Conjunctiva/sclera: Conjunctivae normal.     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     Pulses: Normal pulses.  Heart sounds: Normal heart sounds.  Pulmonary:     Effort: Pulmonary effort is normal.     Breath sounds: Normal breath sounds.  Abdominal:     General: Bowel sounds are normal.     Palpations: Abdomen is soft.     Comments: Enlarged abdomen  Musculoskeletal:        General: Swelling present. Normal range of motion.     Cervical back: Normal range of motion.     Right lower leg: Edema present.  Skin:    General: Skin is warm and dry.     Comments: Right knee with wound vac  Neurological:     General: No focal deficit present.     Mental Status: He is alert and oriented to person, place, and time.  Psychiatric:        Mood and Affect: Mood normal.        Behavior: Behavior normal.        Judgment: Judgment normal.      Labs reviewed: Recent Labs    07/10/23 1247  NA 141  K 4.1  CL 102  CO2 28  GLUCOSE 153*  BUN 85*  CREATININE 2.33*  CALCIUM  8.7*   Recent Labs    07/10/23 1247  AST 21  ALT 21  ALKPHOS 92  BILITOT 0.8  PROT 7.3  ALBUMIN 2.5*   Recent Labs    07/10/23 1247  WBC 7.4  NEUTROABS 6.5  HGB 7.7*  HCT 25.6*  MCV 100.0  PLT 227   No results found for: TSH Lab Results  Component Value Date   HGBA1C 6.5 (H) 06/01/2021   No results found for: CHOL, HDL, LDLCALC, LDLDIRECT, TRIG, CHOLHDL  Significant Diagnostic Results in last 30 days:  No results found.  Assessment/Plan  1. Infection of prosthetic joint, sequela (Primary) -  S/P right revision total knee arthroplasty on  02/26/2024 -   Continue amoxicillin 500 mg every 12 hours x 30 days -   Touchdown weightbearing RLE with knee immobilizer at all times for minimum of 2 weeks -   Follow-up with orthopedics -   For PT and OT, for therapeutic strengthening exercises -   Continue oxycodone  5 mg 1-2 tabs every 4 hours as needed for pain  2. Paroxysmal atrial fibrillation (HCC) -   Rate controlled -   Continue amiodarone  100 mg 1 tab daily  3. 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 Anemia due to blood loss, acute -   hemoglobin 7.3 on postop day 3 and was given 1 unit PRBC -   Hemoglobin improved to 8.1, was given an additional 1 unit prior to discharge  6. Chronic kidney disease, stage IV (severe) (HCC) -  baseline creatinine around 2.4-2.7 - Will monitor     Family/ staff Communication: Discussed plan of care with resident and charge nurse.  Labs/tests ordered:  CBC and BMP done today    Jereld Serum, DNP, MSN, FNP-BC [facility] [phone] (Monday-Friday 8:00 a.m. - 5:00 p.m.) [phone] (after hours)

## 2024-03-05 ENCOUNTER — Encounter: Payer: Self-pay | Admitting: Nurse Practitioner

## 2024-03-05 ENCOUNTER — Non-Acute Institutional Stay (SKILLED_NURSING_FACILITY): Admitting: Nurse Practitioner

## 2024-03-05 DIAGNOSIS — T8450XS Infection and inflammatory reaction due to unspecified internal joint prosthesis, sequela: Secondary | ICD-10-CM

## 2024-03-05 DIAGNOSIS — D62 Acute posthemorrhagic anemia: Secondary | ICD-10-CM | POA: Diagnosis not present

## 2024-03-05 DIAGNOSIS — I5020 Unspecified systolic (congestive) heart failure: Secondary | ICD-10-CM | POA: Diagnosis not present

## 2024-03-05 DIAGNOSIS — N184 Chronic kidney disease, stage 4 (severe): Secondary | ICD-10-CM | POA: Diagnosis not present

## 2024-03-05 NOTE — Progress Notes (Unsigned)
 Location:  Other [facility]) Nursing Home Room Number: 109 A Place of Service:  SNF (31)  Mesiah Manzo K. Caro, NP   Patient Care Team: Ardean Ronnald BRAVO., MD as PCP - General (Internal Medicine)  Extended Emergency Contact Information Primary Emergency Contact: Ursua,Paul Mobile Phone: [phone] Relation: Son Secondary Emergency Contact: Pait,Terri Mobile Phone: [phone] Relation: Daughter  Goals of care: Advanced Directive information    03/04/2024   10:49 AM  Advanced Directives  Does Patient Have a Medical Advance Directive? Yes  Type of Estate agent of [place];Living will;Out of facility DNR (pink MOST or yellow form)  Does patient want to make changes to medical advance directive? No - Patient declined  Copy of Healthcare Power of Attorney in Chart? Yes - validated most recent copy scanned in chart (See row information)  Pre-existing out of facility DNR order (yellow form or pink MOST form) Pink MOST form placed in chart (order not valid for inpatient use);Yellow form placed in chart (order not valid for inpatient use)     Chief Complaint  Patient presents with   Weight Gain    HPI:  Pt is a 83 y.o. male seen today for an acute visit for weight gain.   Pt is at [facility] after right revision of total knee arthroplasty.  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 During hospitalization  General medicine was consulted for medical co-management due to CHF and CKD, CAD s/p CABG and Due to ischemic cardiomyopathy, EF of 40%. On discharge  Carvedilol  6.25mg  BID was continued but   Holding home spironolactone  25mg  Holding home sacubitril-valsartan Holding  empagliflozin Holding torsemide  due to low BP Daily weights, strict I/Os ordered due to holding medications/diuretics Blood pressures have been stable and staff notes weight has been trending up with 3.5 lb weight gain over night.  No shortness of breath, chest pains, or significant increase in LE edema per patient.  He does note a cough but reports this unchanged.   Reports pain is controlled on current regimen No constipation noted.      Past Medical History:  Diagnosis Date   A-fib [facility])    a.) CHA2DS2-VASc = 6 (age x 2, CHF, HTN, previous MI, T2DM). b.) Rate/rhythm maintained on amiodarone  + diltiazem  + carvedilol ; chronically anticoagulated using dabigatran . c.) DCCV 07/10/2015, 08/08/2019, 10/22/2019.   Arthritis    Chest pain 06/26/2019   CHF (congestive heart failure) (HCC)    CKD (chronic kidney disease), stage III (HCC)    Coronary artery disease 1991   a.) 2v CABG in 1991. b.) PCI 06/07/2006 --> Vision stents to native LCx and RCA   Hyperlipidemia    Hypertension    Long term current use of anticoagulant    a.) dabigatran    Myocardial infarction (HCC) 1991   a.) LHC --> 50% and 90% LAD lesions; referred to CVTS. 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   a.) LIMA-LAD, SVG-RCA   T2DM (type 2 diabetes mellitus) (HCC)    Past Surgical History:  Procedure Laterality Date   BACK SURGERY  1970   L5-L6 ruptured disk   BLADDER SURGERY  2000   CARDIOVERSION N/A 07/10/2015   CARDIOVERSION N/A 08/08/2019   CARDIOVERSION N/A 10/22/2019   COLON SURGERY     COLONOSCOPY N/A 01/15/2020   Procedure: COLONOSCOPY;  Surgeon: Janalyn Keene NOVAK, MD;  Location: [facility] ENDOSCOPY;  Service: Endoscopy;  Laterality: N/A;   COLONOSCOPY N/A 01/16/2020   Procedure: COLONOSCOPY;  Surgeon: Janalyn Keene NOVAK, MD;  Location: [facility] ENDOSCOPY;  Service: Endoscopy;  Laterality: N/A;   CORONARY ARTERY BYPASS GRAFT N/A 01/02/1990   Procedure:  2v CORONARY ARTERY BYPASS GRAFT (LIMA-LAD, SVG-RCA)   ESOPHAGOGASTRODUODENOSCOPY N/A 01/13/2020   Procedure: ESOPHAGOGASTRODUODENOSCOPY (EGD);  Surgeon: Janalyn Keene NOVAK, MD;  [facility] ENDOSCOPY;  Service: Endoscopy;  Laterality: N/A;   ESOPHAGOGASTRODUODENOSCOPY (EGD) WITH PROPOFOL  N/A 06/10/2022   Procedure: ESOPHAGOGASTRODUODENOSCOPY (EGD) WITH PROPOFOL ;  Surgeon: Maryruth Ole DASEN, MD;  Location: [facility] ENDOSCOPY;  Service: Endoscopy;  Laterality: N/A;   REPLACEMENT TOTAL KNEE BILATERAL Bilateral 2008   STENT PLACEMENT VASCULAR ([facility] HX)  2008   TOTAL KNEE REVISION Left 01/16/2020   Procedure: TOTAL KNEE REVISION;  Surgeon: Leora Lynwood SAUNDERS, MD;  Location: [facility] ORS;  Service: Orthopedics;  Laterality: Left;   TOTAL KNEE REVISION Left 05/31/2021   Procedure: TOTAL KNEE REVISION;  Surgeon: Leora Lynwood SAUNDERS, MD;  Location: [facility] ORS;  Service: Orthopedics;  Laterality: Left;    No Known Allergies  Outpatient Encounter Medications as of 03/05/2024  Medication Sig   acetaminophen  (TYLENOL ) 500 MG tablet Take 1,000 mg by mouth 3 (three) times daily. Give 2 tablet by mouth three times a day for pain regimen for 10 Days   allopurinol (ZYLOPRIM) 100 MG tablet Take 100 mg by mouth daily.   ALLOPURINOL PO Take 50 mg by mouth every other day.   amiodarone  (PACERONE ) 100 MG tablet Take 100 mg by mouth daily. Take 1 tablet (100 mg total) by mouth once daily for 30 days   amoxicillin (AMOXIL) 500 MG capsule Take 500 mg by mouth every 12 (twelve) hours. Take 1 capsule (500 mg total) by mouth every 12 (twelve) hours for post surgical Prophylaxis for 30 days   aspirin  81 MG chewable tablet Chew 81 mg by mouth in the morning and at bedtime. Take 1 tablet (81 mg total) by mouth 2 (two) times daily for DVT prophylaxis for 30 days   atorvastatin  (LIPITOR) 40 MG tablet Take 40 mg by mouth daily.   carvedilol  (COREG ) 12.5 MG tablet Take 6.25 mg by mouth 2 (two) times daily. Take 0.5 tablets (6.25 mg total)  by mouth 2 (two) times daily   empagliflozin (JARDIANCE) 10 MG TABS tablet Take 10 mg by mouth daily.   Multiple Vitamin (QUINTABS) TABS Take 1 tablet by mouth daily. Take 1 tablet by mouth once daily for 30 days   ondansetron  (ZOFRAN -ODT) 4 MG disintegrating tablet Take 4 mg by mouth every 6 (six) hours as needed for nausea or vomiting. Take 1 tablet (4 mg total) by mouth every 6 (six) hours as needed for Nausea for up to 7 days   oxyCODONE  (OXY IR/ROXICODONE ) 5 MG immediate release tablet Take 5-10 mg by mouth every 4 (four) hours as needed for severe pain (pain score 7-10) or moderate pain (pain score 4-6). Take 1-2 tablets (5-10 mg total) by mouth every 4 (four) hours as needed for up to 5 days   pantoprazole  (PROTONIX ) 40 MG tablet Take 40 mg by mouth daily.   polyethylene glycol (MIRALAX  / GLYCOLAX ) 17 g packet Take 17 g by mouth. Take 1 packet (17 g total)  by mouth 2 (two) times daily for 7 days Mix in 4-8ounces of fluid prior to taking.   senna-docusate (SENOKOT-S) 8.6-50 MG tablet Take 2 tablets by mouth 2 (two) times daily. Take 2 tablets by mouth 2 (two) times daily for 7 days   torsemide  (DEMADEX ) 20 MG tablet Take 20 mg by mouth at bedtime. See second listing also   torsemide  (DEMADEX ) 20 MG tablet Take 40 mg by mouth daily. See bedtime listing also   triamcinolone ointment (KENALOG) 0.1 % Apply 1 Application topically as directed. Apply to rash on buttocks topically every 24 hours as needed for rash   albuterol  (PROVENTIL ) (2.5 MG/3ML) 0.083% nebulizer solution Take 3 mLs (2.5 mg total) by nebulization every 4 (four) hours as needed for wheezing or shortness of breath. (Patient not taking: Reported on 03/05/2024)   guaiFENesin -dextromethorphan (ROBITUSSIN DM) 100-10 MG/5ML syrup Take 5 mLs by mouth every 4 (four) hours as needed for cough. (Patient not taking: Reported on 03/05/2024)   PROAIR  RESPICLICK 108 (90 Base) MCG/ACT AEPB Inhale into the lungs. (Patient not taking: Reported on  03/05/2024)   Semaglutide , 1 MG/DOSE, 2 MG/1.5ML SOPN Inject 1 mg into the skin every Thursday. (Patient not taking: Reported on 03/05/2024)   No facility-administered encounter medications on file as of 03/05/2024.    Review of Systems  Constitutional:  Negative for activity change, appetite change, fatigue and unexpected weight change.  HENT:  Negative for congestion and hearing loss.   Eyes: Negative.   Respiratory:  Positive for cough. Negative for shortness of breath.   Cardiovascular:  Positive for leg swelling. Negative for chest pain and palpitations.  Gastrointestinal:  Negative for abdominal pain, constipation and diarrhea.  Genitourinary:  Negative for difficulty urinating and dysuria.  Musculoskeletal:  Positive for arthralgias and gait problem. Negative for myalgias.  Skin:  Negative for color change and wound.  Neurological:  Negative for dizziness and weakness.  Psychiatric/Behavioral:  Negative for agitation, behavioral problems and confusion.     Immunization History  Administered Date(s) Administered    sv, Bivalent, Protein Subunit Rsvpref,pf (Abrysvo) 05/07/2022   Fluzone Influenza virus vaccine,trivalent (IIV3), split virus 04/03/2014   INFLUENZA, HIGH DOSE SEASONAL PF 03/04/2016, 02/18/2017, 02/16/2018, 02/07/2019, 02/07/2019, 03/19/2020, 05/02/2021, 05/01/2022, 02/19/2023   Influenza,trivalent, recombinat, inj, PF 04/03/2014   Influenza-Unspecified 04/14/2015, 04/14/2015, 02/21/2023   Moderna Sars-Covid-2 Vaccination 02/21/2023   PFIZER(Purple Top)SARS-COV-2 Vaccination 06/14/2019, 07/05/2019, 03/19/2020   Pneumococcal Conjugate-13 08/30/2013, 08/30/2013, 04/14/2015, 04/14/2015   Pneumococcal Polysaccharide-23 02/07/2007   Td 11/03/2011   Td (Adult),5 Lf Tetanus Toxid, Preservative Free 11/03/2011   Tdap 02/07/2007, 08/27/2021   Zoster, Live 05/10/2011, 05/10/2011   Pertinent  Health Maintenance Due  Topic Date Due   FOOT EXAM  Never done   OPHTHALMOLOGY  EXAM  Never done   HEMOGLOBIN A1C  11/29/2021   Influenza Vaccine  12/22/2023      06/03/2022    8:31 PM 06/04/2022    8:00 AM 06/04/2022    9:26 PM 06/05/2022    9:00 AM 06/05/2022    9:38 PM  Fall Risk  (RETIRED) Patient Fall Risk Level High fall risk  Moderate fall risk  High fall risk  High fall risk  High fall risk      Data saved with a previous flowsheet row definition   Functional Status Survey:    Vitals:   03/05/24 1448  BP: (!) 128/57  Pulse: 62  SpO2: 94%  Weight: 217 lb 8 oz (98.7 kg)  Height: 6' (1.829 m)  Body mass index is 29.5 kg/m. Physical Exam Constitutional:      General: He is not in acute distress.    Appearance: He is well-developed. He is not diaphoretic.  HENT:     Head: Normocephalic and atraumatic.     Right Ear: External ear normal.     Left Ear: External ear normal.     Mouth/Throat:     Pharynx: No oropharyngeal exudate.  Eyes:     Conjunctiva/sclera: Conjunctivae normal.     Pupils: Pupils are equal, round, and reactive to light.  Cardiovascular:     Rate and Rhythm: Normal rate and regular rhythm.     Heart sounds: Normal heart sounds.  Pulmonary:     Effort: Pulmonary effort is normal.     Breath sounds: Normal breath sounds.  Abdominal:     General: Bowel sounds are normal.     Palpations: Abdomen is soft.  Musculoskeletal:        General: No tenderness.     Cervical back: Normal range of motion and neck supple.     Right lower leg: Edema present.     Left lower leg: Edema present.     Comments: LE edema right > left.   Skin:    General: Skin is warm and dry.  Neurological:     Mental Status: He is alert and oriented to person, place, and time.     Labs reviewed: Recent Labs    07/10/23 1247 03/04/24 0000  NA 141 140  K 4.1 4.0  CL 102 110*  CO2 28 21  GLUCOSE 153*  --   BUN 85* 90*  CREATININE 2.33* 2.1*  CALCIUM  8.7* 7.4*   Recent Labs    07/10/23 1247  AST 21  ALT 21  ALKPHOS 92  BILITOT 0.8  PROT  7.3  ALBUMIN 2.5*   Recent Labs    07/10/23 1247 03/04/24 0000  WBC 7.4 5.4  NEUTROABS 6.5 4,115.00  HGB 7.7* 8.8*  HCT 25.6* 28*  MCV 100.0  --   PLT 227 151   No results found for: TSH Lab Results  Component Value Date   HGBA1C 6.5 (H) 06/01/2021   No results found for: CHOL, HDL, LDLCALC, LDLDIRECT, TRIG, CHOLHDL  Significant Diagnostic Results in last 30 days:  No results found.  Assessment/Plan  1. HFrEF (heart failure with reduced ejection fraction) (HCC) (Primary) Entresto, aldactone  and torsemide  all being held at this time. BP has improved with weight gain noted. Will add toresmide back per home regimen- he was taking torsemide  20 mg 2 tabs q am and 1 in the pm.  Continue to monitor weights and VS Continues coreg  BID -bmp in 1 week.   2. Anemia due to blood loss, acute Improving at this time. Will continue to follow.   3. Chronic kidney disease, stage IV (severe) (HCC) Baseline Cr around 2.4-2.7 Stable at this time Will add torsemide  added back and will follow up in 1 week  4. Infection of prosthetic joint, sequela S/p revision- continues Amoxicillin 500mg  PO q12h for 30 days was continued postoperatively.  Pain controlled on current regimen.     Malikah Lakey K. Caro BODILY [facility] [phone]

## 2024-03-06 ENCOUNTER — Non-Acute Institutional Stay (SKILLED_NURSING_FACILITY): Payer: Self-pay | Admitting: Internal Medicine

## 2024-03-06 ENCOUNTER — Encounter: Payer: Self-pay | Admitting: Internal Medicine

## 2024-03-06 DIAGNOSIS — I5023 Acute on chronic systolic (congestive) heart failure: Secondary | ICD-10-CM | POA: Diagnosis not present

## 2024-03-06 DIAGNOSIS — D62 Acute posthemorrhagic anemia: Secondary | ICD-10-CM | POA: Diagnosis not present

## 2024-03-06 DIAGNOSIS — N1832 Chronic kidney disease, stage 3b: Secondary | ICD-10-CM | POA: Diagnosis not present

## 2024-03-06 DIAGNOSIS — Z96651 Presence of right artificial knee joint: Secondary | ICD-10-CM

## 2024-03-06 DIAGNOSIS — Z9189 Other specified personal risk factors, not elsewhere classified: Secondary | ICD-10-CM

## 2024-03-06 DIAGNOSIS — G4733 Obstructive sleep apnea (adult) (pediatric): Secondary | ICD-10-CM

## 2024-03-06 NOTE — Assessment & Plan Note (Signed)
 Current eGFR is 30 indicating low stage IIIb CKD.  The significance of advanced CKD was discussed with him and its impact on medications and dosages.

## 2024-03-06 NOTE — Assessment & Plan Note (Addendum)
 Torsemide  ,which has been held because of soft blood pressures, was reinitiated 10/14.  At this time no significant peripheral edema or neck vein distention present.

## 2024-03-06 NOTE — Progress Notes (Signed)
 NURSING HOME LOCATION: [facility] ROOM NUMBER: 109  CODE STATUS: DNR  PCP: Ronnald Lay, MD  This is a comprehensive admission note to this SNFperformed on this date less than 30 days from date of admission. Included are preadmission medical/surgical history; reconciled medication list; family history; social history and comprehensive review of systems.  Corrections and additions to the records were documented. Comprehensive physical exam was also performed. Additionally a clinical summary was entered for each active diagnosis pertinent to this admission in the Problem List to enhance continuity of care.  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  He had had total knee arthroplasties in 2008.  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  There was reimplantation of the left total knee arthroplasty in 2023. 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  On 10/6 he underwent right revision of total knee arthroplasty. Postop H/H was 7.3/22.8; he received 1 unit of packed red cells.  Hemoglobin increased to 8.1 & he received an additional 1 unit of packed cells prior to discharge. He has a diagnosis of ischemic cardiomyopathy with an EF of 40%.  Spironolactone , sacubitril-valsartan, and empagliflozin were held because of soft BPs.  These were to be resumed once his blood pressures were consistently greater than 120 systolic.  Torsemide  was to be resumed first. Amoxicillin 500 mg p.o. every 12 hours for 30 days was continued postoperatively. He was admitted to this facility on 10/10 for short-term rehab. Labs were performed here 10/13 and revealed an eGFR of 30 compatible with low stage IIIb  CKD.  Past medical and surgical history: Includes A-fib; history of congestive heart failure; CAD with history of MI; dyslipidemia; essential hypertension; OSA; and diabetes with CKD stage III. Surgeries and procedures include L5-6 ruptured disc surgery; bladder surgery; colonoscopy; CABG; & EGD.  Family history: reviewed, non contributory due to advanced age.  Social history: He quit smoking in 1991; he smoked for 35 years with final consumption of 2 packs/day or more .  He does not drink.  He states formally he would have an occasional beer.   Review of systems: He states he is continue to have pain and is resistant to having the oxycodone  discontinued.  He has an orthopedic follow-up 10/17. He states that he was never diagnosed with obstructive sleep apnea.  He stated they told me I got to be checked out but I never did.  He does describe occasional cough which is essentially nonproductive.  If he is in bed and tries to eat; he does have some dysphagia.  He has been sedentary essentially,bedridden for 10 months prior to the surgery.  He describes tingling in his fingers bilaterally.  He stated that his diabetes was well-controlled.  He would only check the glucose every 2 weeks and stated it was never more than 120.  Constitutional: No fever, significant weight change  Eyes: No redness, discharge, pain, vision change ENT/mouth: No nasal congestion, purulent discharge, earache, change in hearing, sore throat  Cardiovascular: No chest pain, palpitations, paroxysmal nocturnal dyspnea  Respiratory: No hemoptysis,  significant snoring, apnea  Gastrointestinal: No heartburn, abdominal pain, nausea /vomiting, rectal bleeding, melena, change in bowels Genitourinary: No dysuria, hematuria, pyuria, incontinence Dermatologic: No rash, pruritus, change in appearance of skin Neurologic:  No dizziness, headache, syncope, seizures Psychiatric: No significant anxiety, depression, insomnia,  anorexia Endocrine: No change in hair/skin/nails, excessive thirst, excessive hunger, excessive urination  Hematologic/lymphatic: No significant bruising, lymphadenopathy, abnormal bleeding Allergy/immunology: No itchy/watery eyes, significant sneezing, urticaria, angioedema  Physical exam:  Pertinent or positive findings: He appears his age and somewhat chronically ill.  Eyebrows are absent.  The left eye is deviated laterally and superiorly.  He has a beard and mustache.  He is completely edentulous.  He has raspy somewhat bronchovesicular breath sounds with isolated expiratory wheeze.  Heart rhythm is slightly irregular.  There is massive central obesity. Right lower extremity is wrapped and has a polar pad over the right knee area.  Below the wrapping on the right hyperpigmentation and scarring are present.  This is also visible on the left lower extremity.  There appears to be marked atrophy of the calves.  The toenails are deformed.  He has dry, keratotic and somewhat exfoliative changes over the dorsum of the feet.  Posterior tibial pulse is palpable; dorsalis pedis pulses not.  The right index finger is foreshortened with a contraction at the DIP joint.  The fourth right fingernail is deformed.  He has interosseous wasting of the hands. Scattered UE ecchymoses present.  General appearance:  no acute distress, increased work of breathing is present.   Lymphatic: No lymphadenopathy about the head, neck, axilla. Eyes: No conjunctival inflammation or lid edema is present. There is no scleral icterus. Ears:  External ear exam shows no significant lesions or deformities.   Nose:  External nasal examination shows no deformity or inflammation. Nasal mucosa are pink and moist without lesions, exudates Neck:  No thyromegaly, masses, tenderness noted.    Heart:  No gallop, murmur, click, rub.  Lungs:  without  rubs. Abdomen: Bowel sounds are normal.  Abdomen is soft and nontender with no organomegaly,  hernias, masses. GU: Deferred  Extremities:  No cyanosis, clubbing, edema. Neurologic exam:  Balance, Rhomberg, finger to nose testing could not be completed due to clinical state Skin: Warm & dry w/o tenting.   See clinical summary under each active problem in the Problem List with associated updated therapeutic plan:  Acute blood loss anemia Other than extensive ecchymoses, no bleeding dyscrasias reported monitor; continue to monitor.  Acute on chronic heart failure (HCC) Torsemide  ,which has been held because of soft blood pressures, was reinitiated 10/14.  At this time no significant peripheral edema or neck vein distention present.  OSA on CPAP 03/06/2024 he denies ever being tested for OSA or using CPAP.  His comment was they told me I got to be checked out, but I never did. Untreated OSA associated risk, especially in the context of opioid use, discussed with him  Stage 3b chronic kidney disease (CKD) (HCC) Current eGFR is 30 indicating low stage IIIb CKD.  The significance of advanced CKD was discussed with him and its impact on medications and dosages.  Status post revision of total knee replacement, right Pain medications were to be discontinued as of today.  He states he is continuing to have pain and is resistant to their discontinuation.  Oxycodone  will be decreased to 5 mg every 6 hours as needed and duloxetine 30 mg added.  Acetaminophen  at 1000 mg 3 times daily will also be continued.  Risk related to the opioid continued use in the context of his multiple advanced comorbidities discussed with him. PT/OT at SNF as tolerated. Ortho follow-up visit 03/08/2024.  At risk for adverse  drug event Oxycodone  will be decreased to 5 mg every 6 hours as needed with subsequent wean as tolerated.  Duloxetine 30 mg will be added.  Nonsteroidals and an agent such as meloxicam are not an option because of his advanced CKD. Standard of care concerning dose and duration of opioids postop  was discussed with him as well as the use risk if opioids continue on a regular basis.  I emphasized that if the pain is not improving; the response should be orthopedic reassessment rather than increasing the opioids with their associated risk with his advanced comorbidities.

## 2024-03-06 NOTE — Patient Instructions (Signed)
 See assessment and plan under each diagnosis in the problem list and acutely for this visit

## 2024-03-06 NOTE — Assessment & Plan Note (Signed)
 Other than extensive ecchymoses, no bleeding dyscrasias reported monitor; continue to monitor.

## 2024-03-06 NOTE — Assessment & Plan Note (Addendum)
 03/06/2024 he denies ever being tested for OSA or using CPAP.  His comment was they told me I got to be checked out, but I never did. Untreated OSA associated risk, especially in the context of opioid use, discussed with him

## 2024-03-06 NOTE — Assessment & Plan Note (Addendum)
 Pain medications were to be discontinued as of today.  He states he is continuing to have pain and is resistant to their discontinuation.  Oxycodone  will be decreased to 5 mg every 6 hours as needed and duloxetine 30 mg added.  Acetaminophen  at 1000 mg 3 times daily will also be continued.  Risk related to the opioid continued use in the context of his multiple advanced comorbidities discussed with him. PT/OT at SNF as tolerated. Ortho follow-up visit 03/08/2024.

## 2024-03-08 ENCOUNTER — Other Ambulatory Visit: Payer: Self-pay | Admitting: Orthopedic Surgery

## 2024-03-08 MED ORDER — OXYCODONE HCL 5 MG PO TABS: 5.0000 mg | ORAL_TABLET | ORAL | 0 refills | Status: AC | PRN

## 2024-03-08 NOTE — Assessment & Plan Note (Signed)
 Oxycodone  will be decreased to 5 mg every 6 hours as needed with subsequent wean as tolerated.  Duloxetine 30 mg will be added.  Nonsteroidals and an agent such as meloxicam are not an option because of his advanced CKD. Standard of care concerning dose and duration of opioids postop was discussed with him as well as the use risk if opioids continue on a regular basis.  I emphasized that if the pain is not improving; the response should be orthopedic reassessment rather than increasing the opioids with their associated risk with his advanced comorbidities.

## 2024-03-11 LAB — BASIC METABOLIC PANEL WITH GFR
BUN: 62 — AB (ref 4–21)
CO2: 25 — AB (ref 13–22)
Chloride: 107 (ref 99–108)
Creatinine: 1.8 — AB (ref 0.6–1.3)
Glucose: 114
Potassium: 4.1 meq/L (ref 3.5–5.1)
Sodium: 140 (ref 137–147)

## 2024-03-11 LAB — COMPREHENSIVE METABOLIC PANEL WITH GFR
Calcium: 7.9 — AB (ref 8.7–10.7)
eGFR: 36

## 2024-03-21 ENCOUNTER — Encounter: Payer: Self-pay | Admitting: Nurse Practitioner

## 2024-03-21 ENCOUNTER — Non-Acute Institutional Stay (SKILLED_NURSING_FACILITY): Admitting: Nurse Practitioner

## 2024-03-21 DIAGNOSIS — K219 Gastro-esophageal reflux disease without esophagitis: Secondary | ICD-10-CM

## 2024-03-21 DIAGNOSIS — M1A9XX1 Chronic gout, unspecified, with tophus (tophi): Secondary | ICD-10-CM

## 2024-03-21 DIAGNOSIS — D62 Acute posthemorrhagic anemia: Secondary | ICD-10-CM | POA: Diagnosis not present

## 2024-03-21 DIAGNOSIS — N1832 Chronic kidney disease, stage 3b: Secondary | ICD-10-CM | POA: Diagnosis not present

## 2024-03-21 DIAGNOSIS — Z96651 Presence of right artificial knee joint: Secondary | ICD-10-CM

## 2024-03-21 DIAGNOSIS — I5020 Unspecified systolic (congestive) heart failure: Secondary | ICD-10-CM | POA: Diagnosis not present

## 2024-03-21 DIAGNOSIS — I480 Paroxysmal atrial fibrillation: Secondary | ICD-10-CM

## 2024-03-21 NOTE — Progress Notes (Signed)
 Location:  Other [facility].  Nursing Home Room Number: [facility] [id number] Place of Service:  SNF [phone]) Harlene An, NP  PCP: Ardean Ronnald BRAVO., MD  Patient Care Team: Ardean Ronnald BRAVO., MD as PCP - General (Internal Medicine)  Extended Emergency Contact Information Primary Emergency Contact: Cadle,Paul Mobile Phone: [phone] Relation: Son Secondary Emergency Contact: Pait,Terri Mobile Phone: [phone] Relation: Daughter  Goals of care: Advanced Directive information    03/04/2024   10:49 AM  Advanced Directives  Does Patient Have a Medical Advance Directive? Yes  Type of Estate Agent of [place];Living will;Out of facility DNR (pink MOST or yellow form)  Does patient want to make changes to medical advance directive? No - Patient declined  Copy of Healthcare Power of Attorney in Chart? Yes - validated most recent copy scanned in chart (See row information)  Pre-existing out of facility DNR order (yellow form or pink MOST form) Pink MOST form placed in chart (order not valid for inpatient use);Yellow form placed in chart (order not valid for inpatient use)     Chief Complaint  Patient presents with   Discharge Note    Discharge    HPI:  Pt is a 83 y.o. male seen today for Discharge.  Pt was hospitalized from 10/6-10/10 at [facility] due to right knee prosthetic joint.  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 He is currently on amoxicillin 500 mg BID for 30 days postoperatively.  Noted to be anemic post-op and received 1 unit PRBC. 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 He was previously on  spironolactone , sacubitril-valsartan, and empagliflozin but were held because of soft BPs.  These were to be resumed once his blood pressures were consistently greater than 120 systolic with torsemide  to be resumed first. His torsemide  was resumed on 10/14 which he has been tolerating well. No shortness of breath, edema, or weight gain noted. He is followed by cardiology  He is followed by cardiology continues to progress with mobility. Pain is well controlled. Continues on oxy IR for pain management.   Reports some constipation but overall controlled with diet and OTC medications.    Past Medical History:  Diagnosis Date   A-fib [facility])    a.) CHA2DS2-VASc = 6 (age x 2, CHF, HTN, previous MI, T2DM). b.) Rate/rhythm maintained on amiodarone  + diltiazem  + carvedilol ; chronically anticoagulated using dabigatran . c.) DCCV 07/10/2015, 08/08/2019, 10/22/2019.   Arthritis    Chest pain 06/26/2019   CHF (congestive heart failure) (HCC)    CKD (chronic kidney disease), stage III (HCC)    Coronary artery disease 1991   a.) 2v CABG in 1991. b.) PCI 06/07/2006 --> Vision stents to native LCx and RCA   Hyperlipidemia    Hypertension    Long term current use of anticoagulant    a.) dabigatran    Myocardial infarction (HCC) 1991   a.) LHC --> 50% and 90% LAD lesions; referred to CVTS. 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   a.) LIMA-LAD, SVG-RCA   T2DM (type 2 diabetes mellitus) (HCC)    Past Surgical History:  Procedure  Laterality Date   BACK SURGERY  1970   L5-L6 ruptured disk   BLADDER SURGERY  2000   CARDIOVERSION N/A 07/10/2015   CARDIOVERSION N/A 08/08/2019   CARDIOVERSION N/A 10/22/2019   COLON SURGERY     COLONOSCOPY N/A 01/15/2020   Procedure: COLONOSCOPY;  Surgeon: Janalyn Keene NOVAK, MD;  Location: [facility] ENDOSCOPY;  Service: Endoscopy;  Laterality: N/A;   COLONOSCOPY N/A 01/16/2020   Procedure: COLONOSCOPY;  Surgeon: Janalyn Keene NOVAK, MD;  Location: [facility] ENDOSCOPY;  Service: Endoscopy;  Laterality: N/A;   CORONARY ARTERY BYPASS GRAFT N/A 01/02/1990   Procedure: 2v CORONARY ARTERY BYPASS GRAFT (LIMA-LAD, SVG-RCA)   ESOPHAGOGASTRODUODENOSCOPY N/A 01/13/2020   Procedure: ESOPHAGOGASTRODUODENOSCOPY (EGD);  Surgeon: Janalyn Keene NOVAK, MD;  Location: [facility] ENDOSCOPY;  Service: Endoscopy;  Laterality: N/A;   ESOPHAGOGASTRODUODENOSCOPY (EGD) WITH PROPOFOL  N/A 06/10/2022   Procedure: ESOPHAGOGASTRODUODENOSCOPY (EGD) WITH PROPOFOL ;  Surgeon: Maryruth Ole DASEN, MD;  Location: [facility] ENDOSCOPY;  Service: Endoscopy;  Laterality: N/A;   REPLACEMENT TOTAL KNEE BILATERAL Bilateral 2008   STENT PLACEMENT VASCULAR ([facility] HX)  2008   TOTAL KNEE REVISION Left 01/16/2020   Procedure: TOTAL KNEE REVISION;  Surgeon: Leora Lynwood SAUNDERS, MD;  Location: [facility] ORS;  Service: Orthopedics;  Laterality: Left;   TOTAL KNEE REVISION Left 05/31/2021   Procedure: TOTAL KNEE REVISION;  Surgeon: Leora Lynwood SAUNDERS, MD;  Location: [facility] ORS;  Service: Orthopedics;  Laterality: Left;    No Known Allergies  Outpatient Encounter Medications as of 03/21/2024  Medication Sig   amiodarone  (PACERONE ) 100 MG tablet Take 100 mg by mouth daily. Take 1 tablet (100 mg total) by mouth once daily for 30 days   amoxicillin (AMOXIL) 500 MG capsule Take 500 mg by mouth every 12 (twelve) hours. Take 1 capsule (500 mg total) by mouth every 12 (twelve) hours for post surgical Prophylaxis for 30 days   aspirin  81 MG chewable tablet Chew 81 mg by mouth in the morning and at bedtime. Take 1 tablet (81 mg total) by mouth 2 (two) times daily for DVT prophylaxis for 30 days   atorvastatin  (LIPITOR) 40 MG tablet Take 40 mg by mouth daily.   carvedilol  (COREG ) 12.5 MG tablet Take 6.25 mg by mouth 2 (two) times daily. Take 0.5 tablets (6.25 mg total) by mouth 2 (two) times daily   DULoxetine (CYMBALTA) 30 MG capsule Take 30 mg by mouth daily.   empagliflozin (JARDIANCE) 10 MG TABS  tablet Take 10 mg by mouth daily.   Multiple Vitamin (QUINTABS) TABS Take 1 tablet by mouth daily. Take 1 tablet by mouth once daily for 30 days   oxycodone  (OXY-IR) 5 MG capsule Take 5 mg by mouth every 6 (six) hours as needed.   pantoprazole  (PROTONIX ) 40 MG tablet Take 40 mg by mouth daily.   torsemide  (DEMADEX ) 20 MG tablet Take 20 mg by mouth at bedtime. See second listing also   torsemide  (DEMADEX ) 20 MG tablet Take 40 mg by mouth daily. See bedtime listing also   triamcinolone ointment (KENALOG) 0.1 % Apply 1 Application topically as directed. Apply to rash on buttocks topically every 24 hours as needed for rash   allopurinol (ZYLOPRIM) 100 MG tablet Take 50 mg by mouth every other day.   [DISCONTINUED] albuterol  (PROVENTIL ) (2.5 MG/3ML) 0.083% nebulizer solution Take 3 mLs (2.5 mg total) by nebulization every 4 (four) hours as needed for wheezing or shortness of breath. (Patient not taking: Reported on 03/21/2024)   [DISCONTINUED] allopurinol (ZYLOPRIM) 100 MG tablet Take 100 mg by  mouth every other day. (Patient not taking: Reported on 03/21/2024)   [DISCONTINUED] guaiFENesin -dextromethorphan (ROBITUSSIN DM) 100-10 MG/5ML syrup Take 5 mLs by mouth every 4 (four) hours as needed for cough. (Patient not taking: Reported on 03/21/2024)   [DISCONTINUED] PROAIR  RESPICLICK 108 (90 Base) MCG/ACT AEPB Inhale into the lungs. (Patient not taking: Reported on 03/21/2024)   [DISCONTINUED] Semaglutide , 1 MG/DOSE, 2 MG/1.5ML SOPN Inject 1 mg into the skin every Thursday. (Patient not taking: Reported on 03/21/2024)   No facility-administered encounter medications on file as of 03/21/2024.    Review of Systems  Constitutional:  Negative for activity change, appetite change, fatigue and unexpected weight change.  HENT:  Negative for congestion and hearing loss.   Eyes: Negative.   Respiratory:  Negative for cough and shortness of breath.   Cardiovascular:  Negative for chest pain, palpitations and leg  swelling.  Gastrointestinal:  Negative for abdominal pain, constipation and diarrhea.  Genitourinary:  Negative for difficulty urinating and dysuria.  Musculoskeletal:  Positive for arthralgias. Negative for myalgias.  Skin:  Negative for color change and wound.  Neurological:  Positive for weakness. Negative for dizziness.  Psychiatric/Behavioral:  Negative for agitation, behavioral problems and confusion.      Immunization History  Administered Date(s) Administered    sv, Bivalent, Protein Subunit Rsvpref,pf (Abrysvo) 05/07/2022   Fluzone Influenza virus vaccine,trivalent (IIV3), split virus 04/03/2014   INFLUENZA, HIGH DOSE SEASONAL PF 03/04/2016, 02/18/2017, 02/16/2018, 02/07/2019, 02/07/2019, 03/19/2020, 05/02/2021, 05/01/2022, 02/19/2023   Influenza,trivalent, recombinat, inj, PF 04/03/2014   Influenza-Unspecified 04/14/2015, 04/14/2015, 02/21/2023   Moderna Sars-Covid-2 Vaccination 02/21/2023   PFIZER(Purple Top)SARS-COV-2 Vaccination 06/14/2019, 07/05/2019, 03/19/2020   Pneumococcal Conjugate-13 08/30/2013, 08/30/2013, 04/14/2015, 04/14/2015   Pneumococcal Polysaccharide-23 02/07/2007   Td 11/03/2011   Td (Adult),5 Lf Tetanus Toxid, Preservative Free 11/03/2011   Tdap 02/07/2007, 08/27/2021   Zoster, Live 05/10/2011, 05/10/2011   Pertinent  Health Maintenance Due  Topic Date Due   FOOT EXAM  Never done   OPHTHALMOLOGY EXAM  Never done   HEMOGLOBIN A1C  11/29/2021   Influenza Vaccine  12/22/2023      06/03/2022    8:31 PM 06/04/2022    8:00 AM 06/04/2022    9:26 PM 06/05/2022    9:00 AM 06/05/2022    9:38 PM  Fall Risk  (RETIRED) Patient Fall Risk Level High fall risk  Moderate fall risk  High fall risk  High fall risk  High fall risk      Data saved with a previous flowsheet row definition   Functional Status Survey:    Vitals:   03/21/24 1355  BP: 133/70  Pulse: 82  Resp: 16  Temp: (!) 97.5 F (36.4 C)  SpO2: 94%  Weight: 212 lb 8 oz (96.4 kg)  Height: 6'  (1.829 m)   Body mass index is 28.82 kg/m. Physical Exam Constitutional:      General: He is not in acute distress.    Appearance: He is well-developed. He is not diaphoretic.  HENT:     Head: Normocephalic and atraumatic.     Mouth/Throat:     Pharynx: No oropharyngeal exudate.  Eyes:     Conjunctiva/sclera: Conjunctivae normal.     Pupils: Pupils are equal, round, and reactive to light.  Cardiovascular:     Rate and Rhythm: Normal rate and regular rhythm.     Heart sounds: Normal heart sounds.  Pulmonary:     Effort: Pulmonary effort is normal.     Breath sounds: Normal breath sounds.  Abdominal:  General: Bowel sounds are normal.     Palpations: Abdomen is soft.  Musculoskeletal:        General: No tenderness.     Cervical back: Normal range of motion and neck supple.     Right lower leg: No edema.     Left lower leg: No edema.     Comments: Right leg wrapped, dressing in place  Skin:    General: Skin is warm and dry.  Neurological:     Mental Status: He is alert and oriented to person, place, and time.     Labs reviewed: Recent Labs    07/10/23 1247 03/04/24 0000 03/11/24 0000  NA 141 140 140  K 4.1 4.0 4.1  CL 102 110* 107  CO2 28 21 25*  GLUCOSE 153*  --   --   BUN 85* 90* 62*  CREATININE 2.33* 2.1* 1.8*  CALCIUM  8.7* 7.4* 7.9*   Recent Labs    07/10/23 1247  AST 21  ALT 21  ALKPHOS 92  BILITOT 0.8  PROT 7.3  ALBUMIN 2.5*   Recent Labs    07/10/23 1247 03/04/24 0000  WBC 7.4 5.4  NEUTROABS 6.5 4,115.00  HGB 7.7* 8.8*  HCT 25.6* 28*  MCV 100.0  --   PLT 227 151   No results found for: TSH Lab Results  Component Value Date   HGBA1C 6.5 (H) 06/01/2021   No results found for: CHOL, HDL, LDLCALC, LDLDIRECT, TRIG, CHOLHDL  Significant Diagnostic Results in last 30 days:  No results found.  Assessment/Plan 1. Status post revision of total knee replacement, right (Primary) Completed on 10/6. He is doing well post  op Continues follow up with orthopedics, advancing in mobility and therapy Continues on amoxicillin- has 10 more days to complete course and will be sent home with supply from [facility].  He is stable to DC home with home health services Has support at home  2. Acute blood loss anemia Hgb stable at this time. PCP to follow up  3. Stage 3b chronic kidney disease (CKD) (HCC) Improved in Cr noted on recent labs.  Encourage proper hydration and to avoid NSAIDS (Aleve, Advil, Motrin, Ibuprofen)   4. HFrEF (heart failure with reduced ejection fraction) (HCC) -stable at this time. Followed by cardiology at [facility].  Torsemide , Spironolactone , sacubitril-valsartan, and empagliflozin were held because of soft BPs during hospitalization. Torsemide  and empaglifozin have been resumed and tolerating well.  No edema, weight gain or shortness of breath  5. Paroxysmal atrial fibrillation (HCC) Rate controlled s/p cardioversion continues on amiodarone .    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 Gastroesophageal reflux disease without esophagitis -stable on protonix    pt is stable for discharge-will need PT/OT per home health. no DME needed.. Rx sent home with pt via supply at [facility], he has follow up scheduled with PCP and will get future refills there.   Jolynda Townley K. Caro BODILY [facility] [phone]

## 2024-06-28 ENCOUNTER — Emergency Department: Admission: EM | Admit: 2024-06-28 | Source: Home / Self Care

## 2024-06-28 ENCOUNTER — Other Ambulatory Visit: Payer: Self-pay

## 2024-06-28 DIAGNOSIS — R310 Gross hematuria: Secondary | ICD-10-CM

## 2024-06-28 LAB — CBC
HCT: 29.1 % — ABNORMAL LOW (ref 39.0–52.0)
Hemoglobin: 9.1 g/dL — ABNORMAL LOW (ref 13.0–17.0)
MCH: 31.8 pg (ref 26.0–34.0)
MCHC: 31.3 g/dL (ref 30.0–36.0)
MCV: 101.7 fL — ABNORMAL HIGH (ref 80.0–100.0)
Platelets: 179 10*3/uL (ref 150–400)
RBC: 2.86 MIL/uL — ABNORMAL LOW (ref 4.22–5.81)
RDW: 17.6 % — ABNORMAL HIGH (ref 11.5–15.5)
WBC: 7.4 10*3/uL (ref 4.0–10.5)
nRBC: 0 % (ref 0.0–0.2)

## 2024-06-28 LAB — COMPREHENSIVE METABOLIC PANEL WITH GFR
ALT: <5 U/L (ref 0–44)
AST: 18 U/L (ref 15–41)
Albumin: 3.2 g/dL — ABNORMAL LOW (ref 3.5–5.0)
Alkaline Phosphatase: 139 U/L — ABNORMAL HIGH (ref 38–126)
Anion gap: 10 (ref 5–15)
BUN: 86 mg/dL — ABNORMAL HIGH (ref 8–23)
CO2: 25 mmol/L (ref 22–32)
Calcium: 8.6 mg/dL — ABNORMAL LOW (ref 8.9–10.3)
Chloride: 102 mmol/L (ref 98–111)
Creatinine, Ser: 2.42 mg/dL — ABNORMAL HIGH (ref 0.61–1.24)
GFR, Estimated: 26 mL/min — ABNORMAL LOW
Glucose, Bld: 144 mg/dL — ABNORMAL HIGH (ref 70–99)
Potassium: 4.2 mmol/L (ref 3.5–5.1)
Sodium: 137 mmol/L (ref 135–145)
Total Bilirubin: 0.3 mg/dL (ref 0.0–1.2)
Total Protein: 6.2 g/dL — ABNORMAL LOW (ref 6.5–8.1)

## 2024-06-28 LAB — URINALYSIS, ROUTINE W REFLEX MICROSCOPIC
Bacteria, UA: NONE SEEN
RBC / HPF: >50 RBC/hpf (ref 0–5)
Squamous Epithelial / HPF: 0 /HPF (ref 0–5)
WBC, UA: >50 WBC/hpf (ref 0–5)

## 2024-06-28 MED ORDER — CEFAZOLIN SODIUM-DEXTROSE 2-4 GM/100ML-% IV SOLN
2.0000 g | Freq: Once | INTRAVENOUS | Status: AC
  Administered 2024-06-28: 2 g via INTRAVENOUS
  Filled 2024-06-28: qty 100

## 2024-06-28 MED ORDER — SODIUM CHLORIDE 0.9 % IR SOLN
3000.0000 mL | Status: AC
  Administered 2024-06-28 (×5): 3000 mL

## 2024-06-28 MED ORDER — LIDOCAINE HCL URETHRAL/MUCOSAL 2 % EX GEL
1.0000 | Freq: Once | CUTANEOUS | Status: AC
  Administered 2024-06-28: 1 via URETHRAL
  Filled 2024-06-28: qty 10

## 2024-06-28 NOTE — ED Notes (Signed)
 Pt states he can only urinate when lying on his L side.

## 2024-06-28 NOTE — ED Notes (Signed)
 Report called to Art Therapist at [facility].

## 2024-06-28 NOTE — ED Triage Notes (Signed)
 Has PICC line in L arm, currently receiving IV ABX in it for UTI

## 2024-06-28 NOTE — ED Provider Notes (Signed)
 "  [facility] Provider Note    Event Date/Time   First MD Initiated Contact with Patient 06/28/24 1608     (approximate)   History   Hematuria   HPI  Randall Vincent is a 84 y.o. male seen department today because of concerns for hematuria.  The patient noticed blood a couple of days ago.  It has been bright red in his urine.  Today he feels like he is having a hard time urinating.  He states he is primarily dribbling.  He has noticed some low abdominal discomfort with this.  He denies similar symptoms in the past.  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     Physical Exam   Triage Vital Signs: ED Triage Vitals  Encounter Vitals Group     BP 06/28/24 1446 (!) 118/59     Girls Systolic BP Percentile --      Girls Diastolic BP Percentile --      Boys Systolic BP Percentile --      Boys Diastolic BP Percentile --      Pulse Rate 06/28/24 1446 71     Resp 06/28/24 1446 18     Temp 06/28/24 1446 97.7 F (36.5 C)     Temp Source 06/28/24 1446 Oral     SpO2 06/28/24 1446 97 %     Weight 06/28/24 1447 17 lb (7.711 kg)     Height 06/28/24 1447 5' 10 (1.778 m)     Head Circumference --      Peak Flow --      Pain Score 06/28/24 1450 0     Pain Loc --      Pain Education --      Exclude from Growth Chart --     Most recent vital signs: Vitals:   06/28/24 1446 06/28/24 1632  BP: (!) 118/59 126/61  Pulse: 71 88  Resp: 18 16  Temp: 97.7 F (36.5 C) 97.8 F (36.6 C)  SpO2: 97% 98%   General: Awake, alert, oriented. CV:  Good peripheral perfusion. Regular rate and rhythm. Resp:  Normal effort. Lungs clear. Abd:  No distention. Tender to palpation in the lower abdomen.  ED Results / Procedures / Treatments   Labs (all labs ordered are listed, but only abnormal results are displayed) Labs Reviewed  CBC - Abnormal; Notable for the following components:      Result Value   RBC 2.86 (*)     Hemoglobin 9.1 (*)    HCT 29.1 (*)    MCV 101.7 (*)    RDW 17.6 (*)    All other components within normal limits  COMPREHENSIVE METABOLIC PANEL WITH GFR - Abnormal; Notable for the following components:   Glucose, Bld 144 (*)    BUN 86 (*)    Creatinine, Ser 2.42 (*)    Calcium  8.6 (*)    Total Protein 6.2 (*)    Albumin 3.2 (*)    Alkaline Phosphatase 139 (*)    GFR, Estimated 26 (*)    All other components within normal limits  URINALYSIS, ROUTINE W REFLEX MICROSCOPIC - Abnormal; Notable for the following components:   Color, Urine RED (*)    APPearance TURBID (*)    Glucose, UA   (*)    Value: TEST NOT REPORTED DUE TO COLOR INTERFERENCE OF URINE PIGMENT   Hgb urine dipstick   (*)    Value: TEST NOT REPORTED DUE TO COLOR  INTERFERENCE OF URINE PIGMENT   Bilirubin Urine   (*)    Value: TEST NOT REPORTED DUE TO COLOR INTERFERENCE OF URINE PIGMENT   Ketones, ur   (*)    Value: TEST NOT REPORTED DUE TO COLOR INTERFERENCE OF URINE PIGMENT   Protein, ur   (*)    Value: TEST NOT REPORTED DUE TO COLOR INTERFERENCE OF URINE PIGMENT   Nitrite   (*)    Value: TEST NOT REPORTED DUE TO COLOR INTERFERENCE OF URINE PIGMENT   Leukocytes,Ua   (*)    Value: TEST NOT REPORTED DUE TO COLOR INTERFERENCE OF URINE PIGMENT   All other components within normal limits     EKG  None   RADIOLOGY None   PROCEDURES:  Critical Care performed: No    MEDICATIONS ORDERED IN ED: Medications - No data to display   IMPRESSION / MDM / ASSESSMENT AND PLAN / ED COURSE  I reviewed the triage vital signs and the nursing notes.                              Differential diagnosis includes, but is not limited to, UTI, neoplasm, stone  Patient's presentation is most consistent with acute presentation with potential threat to life or bodily function.   Patient presented to the emergency department today because of concerns for hematuria.  On exam patient does have some tenderness in the lower  abdomen.  Bladder irrigation was performed.  Initial was quite bloody however did clear up.  Patient's blood work without any significant change in his chronic anemia.  Urine did show a large amount of red blood cells and white blood cells however no bacteria.  Will send for culture.  Patient will be discharged with Foley catheter and urology follow-up.     FINAL CLINICAL IMPRESSION(S) / ED DIAGNOSES   Final diagnoses:  Gross hematuria       Note:  This document was prepared using Dragon voice recognition software and may include unintentional dictation errors.    Floy Roberts, MD 06/28/24 2355  "

## 2024-06-28 NOTE — ED Triage Notes (Signed)
 First nurse note: pt to ED [facility] from [facility] for elevated BUN with hematuria for 2 days. Being treated for UTI. Post right AKA x1 month, still taking antibiotics.
